# Patient Record
Sex: Male | Born: 1945 | Race: Black or African American | Hispanic: No | Smoking: Former smoker
Health system: Southern US, Community
[De-identification: ages and names within clinical notes are randomized; demographics above are authoritative.]

## PROBLEM LIST (undated history)

## (undated) DIAGNOSIS — E538 Deficiency of other specified B group vitamins: Secondary | ICD-10-CM

## (undated) DIAGNOSIS — E119 Type 2 diabetes mellitus without complications: Secondary | ICD-10-CM

## (undated) DIAGNOSIS — I219 Acute myocardial infarction, unspecified: Secondary | ICD-10-CM

## (undated) DIAGNOSIS — C3490 Malignant neoplasm of unspecified part of unspecified bronchus or lung: Secondary | ICD-10-CM

## (undated) DIAGNOSIS — I509 Heart failure, unspecified: Secondary | ICD-10-CM

## (undated) DIAGNOSIS — J449 Chronic obstructive pulmonary disease, unspecified: Secondary | ICD-10-CM

## (undated) DIAGNOSIS — I272 Pulmonary hypertension, unspecified: Secondary | ICD-10-CM

## (undated) DIAGNOSIS — E785 Hyperlipidemia, unspecified: Secondary | ICD-10-CM

## (undated) DIAGNOSIS — I10 Essential (primary) hypertension: Secondary | ICD-10-CM

## (undated) HISTORY — PX: APPENDECTOMY: SHX54

## (undated) HISTORY — DX: Pulmonary hypertension, unspecified: I27.20

## (undated) HISTORY — DX: Malignant neoplasm of unspecified part of unspecified bronchus or lung: C34.90

## (undated) HISTORY — PX: LUNG BIOPSY: SHX232

---

## 2003-05-11 ENCOUNTER — Emergency Department (HOSPITAL_COMMUNITY): Admission: EM | Admit: 2003-05-11 | Discharge: 2003-05-11 | Payer: Self-pay | Admitting: Emergency Medicine

## 2003-12-17 ENCOUNTER — Inpatient Hospital Stay (HOSPITAL_COMMUNITY): Admission: EM | Admit: 2003-12-17 | Discharge: 2003-12-22 | Payer: Self-pay | Admitting: Emergency Medicine

## 2004-01-30 ENCOUNTER — Emergency Department (HOSPITAL_COMMUNITY): Admission: EM | Admit: 2004-01-30 | Discharge: 2004-01-30 | Payer: Self-pay | Admitting: Emergency Medicine

## 2005-03-16 ENCOUNTER — Emergency Department (HOSPITAL_COMMUNITY): Admission: EM | Admit: 2005-03-16 | Discharge: 2005-03-16 | Payer: Self-pay | Admitting: Family Medicine

## 2005-11-12 ENCOUNTER — Inpatient Hospital Stay (HOSPITAL_COMMUNITY): Admission: EM | Admit: 2005-11-12 | Discharge: 2005-11-16 | Payer: Self-pay | Admitting: Emergency Medicine

## 2005-11-13 ENCOUNTER — Ambulatory Visit: Payer: Self-pay | Admitting: Cardiology

## 2005-11-13 ENCOUNTER — Encounter: Payer: Self-pay | Admitting: Cardiology

## 2007-05-11 ENCOUNTER — Ambulatory Visit: Payer: Self-pay | Admitting: *Deleted

## 2007-05-11 ENCOUNTER — Inpatient Hospital Stay (HOSPITAL_COMMUNITY): Admission: EM | Admit: 2007-05-11 | Discharge: 2007-05-13 | Payer: Self-pay | Admitting: *Deleted

## 2007-12-11 ENCOUNTER — Ambulatory Visit: Payer: Self-pay | Admitting: Internal Medicine

## 2007-12-11 LAB — CONVERTED CEMR LAB
ALT: 12 units/L (ref 0–53)
AST: 15 units/L (ref 0–37)
Albumin: 4.5 g/dL (ref 3.5–5.2)
Alkaline Phosphatase: 99 units/L (ref 39–117)
BUN: 14 mg/dL (ref 6–23)
CO2: 25 meq/L (ref 19–32)
Calcium: 8.5 mg/dL (ref 8.4–10.5)
Chloride: 106 meq/L (ref 96–112)
Cholesterol: 200 mg/dL (ref 0–200)
Creatinine, Ser: 1.08 mg/dL (ref 0.40–1.50)
Glucose, Bld: 110 mg/dL — ABNORMAL HIGH (ref 70–99)
HDL: 34 mg/dL — ABNORMAL LOW (ref >39)
LDL Cholesterol: 130 mg/dL — ABNORMAL HIGH (ref 0–99)
Potassium: 4.1 meq/L (ref 3.5–5.3)
Sodium: 142 meq/L (ref 135–145)
Total Bilirubin: 0.4 mg/dL (ref 0.3–1.2)
Total CHOL/HDL Ratio: 5.9
Total Protein: 7.6 g/dL (ref 6.0–8.3)
Triglycerides: 182 mg/dL — ABNORMAL HIGH (ref <150)
VLDL: 36 mg/dL (ref 0–40)

## 2008-01-08 ENCOUNTER — Ambulatory Visit: Payer: Self-pay | Admitting: Internal Medicine

## 2008-01-08 LAB — CONVERTED CEMR LAB
Basophils Absolute: 0 10*3/uL (ref 0.0–0.1)
Basophils Relative: 0 % (ref 0–1)
Eosinophils Absolute: 0.1 10*3/uL (ref 0.0–0.7)
Eosinophils Relative: 1 % (ref 0–5)
Free T4: 1.05 ng/dL (ref 0.89–1.80)
HCT: 44.7 % (ref 39.0–52.0)
Hemoglobin: 14.5 g/dL (ref 13.0–17.0)
Lymphocytes Relative: 24 % (ref 12–46)
Lymphs Abs: 2.6 10*3/uL (ref 0.7–4.0)
MCHC: 32.4 g/dL (ref 30.0–36.0)
MCV: 96.5 fL (ref 78.0–100.0)
Monocytes Absolute: 0.8 10*3/uL (ref 0.1–1.0)
Monocytes Relative: 8 % (ref 3–12)
Neutro Abs: 7.1 10*3/uL (ref 1.7–7.7)
Neutrophils Relative %: 67 % (ref 43–77)
Platelets: 204 10*3/uL (ref 150–400)
RBC: 4.63 M/uL (ref 4.22–5.81)
RDW: 13.2 % (ref 11.5–15.5)
T3, Total: 136.4 ng/dL (ref 80.0–204.0)
TSH: 1.208 microintl units/mL (ref 0.350–5.50)
WBC: 10.6 10*3/uL — ABNORMAL HIGH (ref 4.0–10.5)

## 2008-01-22 ENCOUNTER — Ambulatory Visit: Payer: Self-pay | Admitting: Internal Medicine

## 2008-01-26 ENCOUNTER — Ambulatory Visit (HOSPITAL_COMMUNITY): Admission: RE | Admit: 2008-01-26 | Discharge: 2008-01-26 | Payer: Self-pay | Admitting: Family Medicine

## 2008-02-15 ENCOUNTER — Ambulatory Visit: Payer: Self-pay | Admitting: Internal Medicine

## 2008-03-18 ENCOUNTER — Ambulatory Visit: Payer: Self-pay | Admitting: Internal Medicine

## 2008-03-24 ENCOUNTER — Emergency Department (HOSPITAL_COMMUNITY): Admission: EM | Admit: 2008-03-24 | Discharge: 2008-03-24 | Payer: Self-pay | Admitting: Emergency Medicine

## 2008-06-17 ENCOUNTER — Encounter: Payer: Self-pay | Admitting: Family Medicine

## 2008-06-17 ENCOUNTER — Ambulatory Visit: Payer: Self-pay | Admitting: Internal Medicine

## 2008-06-17 LAB — CONVERTED CEMR LAB
ALT: 61 units/L — ABNORMAL HIGH (ref 0–53)
AST: 49 units/L — ABNORMAL HIGH (ref 0–37)
Albumin: 4 g/dL (ref 3.5–5.2)
Alkaline Phosphatase: 81 units/L (ref 39–117)
BUN: 10 mg/dL (ref 6–23)
Basophils Absolute: 0 10*3/uL (ref 0.0–0.1)
Basophils Relative: 0 % (ref 0–1)
CO2: 26 meq/L (ref 19–32)
Calcium: 8.7 mg/dL (ref 8.4–10.5)
Chloride: 103 meq/L (ref 96–112)
Cholesterol: 144 mg/dL (ref 0–200)
Creatinine, Ser: 1.05 mg/dL (ref 0.40–1.50)
Eosinophils Absolute: 0.1 10*3/uL (ref 0.0–0.7)
Eosinophils Relative: 1 % (ref 0–5)
Glucose, Bld: 104 mg/dL — ABNORMAL HIGH (ref 70–99)
HCT: 42.7 % (ref 39.0–52.0)
HDL: 62 mg/dL (ref >39)
Hemoglobin: 13.3 g/dL (ref 13.0–17.0)
Hgb A1c MFr Bld: 5.9 % (ref 4.6–6.1)
LDL Cholesterol: 62 mg/dL (ref 0–99)
Lymphocytes Relative: 24 % (ref 12–46)
Lymphs Abs: 2.1 10*3/uL (ref 0.7–4.0)
MCHC: 31.1 g/dL (ref 30.0–36.0)
MCV: 104.4 fL — ABNORMAL HIGH (ref 78.0–100.0)
Monocytes Absolute: 0.7 10*3/uL (ref 0.1–1.0)
Monocytes Relative: 8 % (ref 3–12)
Neutro Abs: 5.8 10*3/uL (ref 1.7–7.7)
Neutrophils Relative %: 67 % (ref 43–77)
Platelets: 160 10*3/uL (ref 150–400)
Potassium: 3.3 meq/L — ABNORMAL LOW (ref 3.5–5.3)
Pro B Natriuretic peptide (BNP): 49 pg/mL (ref 0.0–100.0)
RBC: 4.09 M/uL — ABNORMAL LOW (ref 4.22–5.81)
RDW: 14.2 % (ref 11.5–15.5)
Sodium: 144 meq/L (ref 135–145)
TSH: 2.362 microintl units/mL (ref 0.350–5.50)
Total Bilirubin: 0.5 mg/dL (ref 0.3–1.2)
Total CHOL/HDL Ratio: 2.3
Total Protein: 6.9 g/dL (ref 6.0–8.3)
Triglycerides: 101 mg/dL (ref <150)
Uric Acid, Serum: 11 mg/dL — ABNORMAL HIGH (ref 4.0–7.8)
VLDL: 20 mg/dL (ref 0–40)
WBC: 8.7 10*3/uL (ref 4.0–10.5)

## 2008-09-05 ENCOUNTER — Inpatient Hospital Stay (HOSPITAL_COMMUNITY): Admission: EM | Admit: 2008-09-05 | Discharge: 2008-09-06 | Payer: Self-pay | Admitting: Emergency Medicine

## 2008-09-16 ENCOUNTER — Ambulatory Visit: Payer: Self-pay | Admitting: Family Medicine

## 2008-09-16 LAB — CONVERTED CEMR LAB
BUN: 24 mg/dL — ABNORMAL HIGH (ref 6–23)
CO2: 28 meq/L (ref 19–32)
Calcium: 10 mg/dL (ref 8.4–10.5)
Chloride: 98 meq/L (ref 96–112)
Creatinine, Ser: 1.42 mg/dL (ref 0.40–1.50)
Glucose, Bld: 144 mg/dL — ABNORMAL HIGH (ref 70–99)
PSA: 0.88 ng/mL (ref 0.10–4.00)
Potassium: 4.2 meq/L (ref 3.5–5.3)
Sodium: 139 meq/L (ref 135–145)
Uric Acid, Serum: 10.9 mg/dL — ABNORMAL HIGH (ref 4.0–7.8)

## 2008-10-06 ENCOUNTER — Emergency Department (HOSPITAL_COMMUNITY): Admission: EM | Admit: 2008-10-06 | Discharge: 2008-10-06 | Payer: Self-pay | Admitting: Emergency Medicine

## 2008-10-13 ENCOUNTER — Ambulatory Visit: Payer: Self-pay | Admitting: Family Medicine

## 2008-10-24 ENCOUNTER — Ambulatory Visit: Payer: Self-pay | Admitting: Internal Medicine

## 2008-10-24 ENCOUNTER — Encounter: Payer: Self-pay | Admitting: Family Medicine

## 2008-10-24 ENCOUNTER — Ambulatory Visit: Payer: Self-pay | Admitting: *Deleted

## 2008-10-24 LAB — CONVERTED CEMR LAB
BUN: 11 mg/dL (ref 6–23)
Basophils Absolute: 0.1 10*3/uL (ref 0.0–0.1)
Basophils Relative: 1 % (ref 0–1)
CO2: 27 meq/L (ref 19–32)
Calcium: 8.8 mg/dL (ref 8.4–10.5)
Chloride: 101 meq/L (ref 96–112)
Creatinine, Ser: 1.22 mg/dL (ref 0.40–1.50)
Eosinophils Absolute: 0.2 10*3/uL (ref 0.0–0.7)
Eosinophils Relative: 3 % (ref 0–5)
Glucose, Bld: 108 mg/dL — ABNORMAL HIGH (ref 70–99)
HCT: 43.1 % (ref 39.0–52.0)
Hemoglobin: 12.8 g/dL — ABNORMAL LOW (ref 13.0–17.0)
Lymphocytes Relative: 33 % (ref 12–46)
Lymphs Abs: 2.4 10*3/uL (ref 0.7–4.0)
MCHC: 29.7 g/dL — ABNORMAL LOW (ref 30.0–36.0)
MCV: 101.9 fL — ABNORMAL HIGH (ref 78.0–100.0)
Monocytes Absolute: 0.6 10*3/uL (ref 0.1–1.0)
Monocytes Relative: 9 % (ref 3–12)
Neutro Abs: 3.8 10*3/uL (ref 1.7–7.7)
Neutrophils Relative %: 54 % (ref 43–77)
Platelets: 362 10*3/uL (ref 150–400)
Potassium: 4.6 meq/L (ref 3.5–5.3)
Pro B Natriuretic peptide (BNP): 25.2 pg/mL (ref 0.0–100.0)
RBC: 4.23 M/uL (ref 4.22–5.81)
RDW: 15.7 % — ABNORMAL HIGH (ref 11.5–15.5)
Sodium: 139 meq/L (ref 135–145)
Uric Acid, Serum: 8.4 mg/dL — ABNORMAL HIGH (ref 4.0–7.8)
WBC: 7.1 10*3/uL (ref 4.0–10.5)

## 2008-11-07 ENCOUNTER — Ambulatory Visit: Payer: Self-pay | Admitting: Family Medicine

## 2008-12-19 ENCOUNTER — Ambulatory Visit: Payer: Self-pay | Admitting: Family Medicine

## 2009-01-03 ENCOUNTER — Ambulatory Visit (HOSPITAL_COMMUNITY): Admission: RE | Admit: 2009-01-03 | Discharge: 2009-01-03 | Payer: Self-pay | Admitting: Family Medicine

## 2009-03-20 ENCOUNTER — Ambulatory Visit: Payer: Self-pay | Admitting: Internal Medicine

## 2009-03-20 ENCOUNTER — Encounter: Payer: Self-pay | Admitting: Family Medicine

## 2009-03-20 LAB — CONVERTED CEMR LAB
ALT: 28 units/L (ref 0–53)
AST: 20 units/L (ref 0–37)
Albumin: 3.9 g/dL (ref 3.5–5.2)
Alkaline Phosphatase: 108 units/L (ref 39–117)
BUN: 15 mg/dL (ref 6–23)
Basophils Absolute: 0.1 10*3/uL (ref 0.0–0.1)
Basophils Relative: 1 % (ref 0–1)
CO2: 23 meq/L (ref 19–32)
Calcium: 9 mg/dL (ref 8.4–10.5)
Chloride: 108 meq/L (ref 96–112)
Cholesterol: 178 mg/dL (ref 0–200)
Creatinine, Ser: 1.07 mg/dL (ref 0.40–1.50)
Eosinophils Absolute: 0.1 10*3/uL (ref 0.0–0.7)
Eosinophils Relative: 1 % (ref 0–5)
Glucose, Bld: 114 mg/dL — ABNORMAL HIGH (ref 70–99)
HCT: 40.2 % (ref 39.0–52.0)
HDL: 54 mg/dL (ref >39)
Hemoglobin: 12.6 g/dL — ABNORMAL LOW (ref 13.0–17.0)
LDL Cholesterol: 97 mg/dL (ref 0–99)
Lymphocytes Relative: 23 % (ref 12–46)
Lymphs Abs: 2.4 10*3/uL (ref 0.7–4.0)
MCHC: 31.3 g/dL (ref 30.0–36.0)
MCV: 108.9 fL — ABNORMAL HIGH (ref 78.0–100.0)
Monocytes Absolute: 1.4 10*3/uL — ABNORMAL HIGH (ref 0.1–1.0)
Monocytes Relative: 13 % — ABNORMAL HIGH (ref 3–12)
Neutro Abs: 6.5 10*3/uL (ref 1.7–7.7)
Neutrophils Relative %: 63 % (ref 43–77)
Platelets: 304 10*3/uL (ref 150–400)
Potassium: 4.3 meq/L (ref 3.5–5.3)
RBC: 3.69 M/uL — ABNORMAL LOW (ref 4.22–5.81)
RDW: 16 % — ABNORMAL HIGH (ref 11.5–15.5)
Sodium: 143 meq/L (ref 135–145)
Total Bilirubin: 0.8 mg/dL (ref 0.3–1.2)
Total CHOL/HDL Ratio: 3.3
Total Protein: 7 g/dL (ref 6.0–8.3)
Triglycerides: 137 mg/dL (ref <150)
Uric Acid, Serum: 8.5 mg/dL — ABNORMAL HIGH (ref 4.0–7.8)
VLDL: 27 mg/dL (ref 0–40)
WBC: 10.4 10*3/uL (ref 4.0–10.5)

## 2009-03-29 ENCOUNTER — Ambulatory Visit: Payer: Self-pay | Admitting: Internal Medicine

## 2009-07-17 ENCOUNTER — Ambulatory Visit: Payer: Self-pay | Admitting: Internal Medicine

## 2009-10-18 ENCOUNTER — Ambulatory Visit: Payer: Self-pay | Admitting: Internal Medicine

## 2010-01-18 ENCOUNTER — Ambulatory Visit: Payer: Self-pay | Admitting: Internal Medicine

## 2010-01-18 LAB — CONVERTED CEMR LAB
ALT: 13 units/L (ref 0–53)
AST: 16 units/L (ref 0–37)
Albumin: 4.4 g/dL (ref 3.5–5.2)
Alkaline Phosphatase: 89 units/L (ref 39–117)
BUN: 12 mg/dL (ref 6–23)
CO2: 26 meq/L (ref 19–32)
Calcium: 9.1 mg/dL (ref 8.4–10.5)
Chloride: 103 meq/L (ref 96–112)
Cholesterol: 186 mg/dL (ref 0–200)
Creatinine, Ser: 1.1 mg/dL (ref 0.40–1.50)
Glucose, Bld: 108 mg/dL — ABNORMAL HIGH (ref 70–99)
HDL: 39 mg/dL — ABNORMAL LOW (ref >39)
LDL Cholesterol: 119 mg/dL — ABNORMAL HIGH (ref 0–99)
Potassium: 4.3 meq/L (ref 3.5–5.3)
Sodium: 140 meq/L (ref 135–145)
Total Bilirubin: 0.5 mg/dL (ref 0.3–1.2)
Total CHOL/HDL Ratio: 4.8
Total Protein: 7.3 g/dL (ref 6.0–8.3)
Triglycerides: 142 mg/dL (ref <150)
VLDL: 28 mg/dL (ref 0–40)

## 2010-02-01 ENCOUNTER — Ambulatory Visit: Payer: Self-pay | Admitting: Internal Medicine

## 2010-02-01 LAB — CONVERTED CEMR LAB
Hgb A2 Quant: 2.8 % (ref 2.2–3.2)
Hgb A: 97.2 % (ref 96.8–97.8)
Hgb F Quant: 0 % (ref 0.0–2.0)
Hgb S Quant: 0 % (ref 0.0–0.0)

## 2010-04-05 ENCOUNTER — Ambulatory Visit: Payer: Self-pay | Admitting: Family Medicine

## 2010-04-09 ENCOUNTER — Ambulatory Visit (HOSPITAL_COMMUNITY): Admission: RE | Admit: 2010-04-09 | Discharge: 2010-04-09 | Payer: Self-pay | Admitting: Internal Medicine

## 2010-04-24 ENCOUNTER — Ambulatory Visit: Payer: Self-pay | Admitting: Internal Medicine

## 2010-04-24 LAB — CONVERTED CEMR LAB
BUN: 24 mg/dL — ABNORMAL HIGH (ref 6–23)
CO2: 21 meq/L (ref 19–32)
Calcium: 9.7 mg/dL (ref 8.4–10.5)
Chloride: 102 meq/L (ref 96–112)
Creatinine, Ser: 1.12 mg/dL (ref 0.40–1.50)
Glucose, Bld: 97 mg/dL (ref 70–99)
Potassium: 4.1 meq/L (ref 3.5–5.3)
Sodium: 141 meq/L (ref 135–145)

## 2010-04-25 ENCOUNTER — Ambulatory Visit (HOSPITAL_COMMUNITY): Admission: RE | Admit: 2010-04-25 | Discharge: 2010-04-25 | Payer: Self-pay | Admitting: Internal Medicine

## 2010-05-09 ENCOUNTER — Ambulatory Visit: Payer: Self-pay | Admitting: Internal Medicine

## 2010-05-10 ENCOUNTER — Ambulatory Visit: Payer: Self-pay | Admitting: Psychiatry

## 2010-05-10 ENCOUNTER — Inpatient Hospital Stay (HOSPITAL_COMMUNITY): Admission: AD | Admit: 2010-05-10 | Discharge: 2010-05-18 | Payer: Self-pay | Admitting: Psychiatry

## 2010-06-07 ENCOUNTER — Ambulatory Visit: Payer: Self-pay | Admitting: Internal Medicine

## 2010-06-07 LAB — CONVERTED CEMR LAB: Microalb, Ur: 0.68 mg/dL (ref 0.00–1.89)

## 2010-08-06 ENCOUNTER — Ambulatory Visit: Payer: Self-pay | Admitting: Internal Medicine

## 2010-08-06 LAB — CONVERTED CEMR LAB
BUN: 15 mg/dL (ref 6–23)
CO2: 28 meq/L (ref 19–32)
Calcium: 9.5 mg/dL (ref 8.4–10.5)
Chloride: 104 meq/L (ref 96–112)
Creatinine, Ser: 1.16 mg/dL (ref 0.40–1.50)
Glucose, Bld: 110 mg/dL — ABNORMAL HIGH (ref 70–99)
Potassium: 4.1 meq/L (ref 3.5–5.3)
Sodium: 145 meq/L (ref 135–145)

## 2010-08-10 ENCOUNTER — Ambulatory Visit (HOSPITAL_COMMUNITY): Admission: RE | Admit: 2010-08-10 | Discharge: 2010-08-10 | Payer: Self-pay | Admitting: Internal Medicine

## 2010-08-13 ENCOUNTER — Ambulatory Visit: Payer: Self-pay | Admitting: Internal Medicine

## 2011-01-20 ENCOUNTER — Encounter: Payer: Self-pay | Admitting: Internal Medicine

## 2011-03-15 LAB — BASIC METABOLIC PANEL
BUN: 6 mg/dL (ref 6–23)
CO2: 28 mEq/L (ref 19–32)
Calcium: 8.9 mg/dL (ref 8.4–10.5)
Chloride: 106 mEq/L (ref 96–112)
Creatinine, Ser: 1.13 mg/dL (ref 0.4–1.5)
GFR calc Af Amer: >60 mL/min (ref >60)
GFR calc non Af Amer: >60 mL/min (ref >60)
Glucose, Bld: 117 mg/dL — ABNORMAL HIGH (ref 70–99)
Potassium: 3.6 mEq/L (ref 3.5–5.1)
Sodium: 141 mEq/L (ref 135–145)

## 2011-03-18 LAB — PLATELET COUNT: Platelets: 107 10*3/uL — ABNORMAL LOW (ref 150–400)

## 2011-03-19 LAB — BASIC METABOLIC PANEL
CO2: 26 mEq/L (ref 19–32)
Calcium: 8.3 mg/dL — ABNORMAL LOW (ref 8.4–10.5)
GFR calc Af Amer: >60 mL/min (ref >60)
GFR calc non Af Amer: 54 mL/min — ABNORMAL LOW (ref >60)
Potassium: 4.1 mEq/L (ref 3.5–5.1)
Sodium: 139 mEq/L (ref 135–145)

## 2011-03-19 LAB — HEPATIC FUNCTION PANEL
Alkaline Phosphatase: 129 U/L — ABNORMAL HIGH (ref 39–117)
Bilirubin, Direct: 1.7 mg/dL — ABNORMAL HIGH (ref 0.0–0.3)
Indirect Bilirubin: 1.9 mg/dL — ABNORMAL HIGH (ref 0.3–0.9)
Total Bilirubin: 3.6 mg/dL — ABNORMAL HIGH (ref 0.3–1.2)

## 2011-03-19 LAB — RAPID URINE DRUG SCREEN, HOSP PERFORMED
Barbiturates: NOT DETECTED
Benzodiazepines: NOT DETECTED
Cocaine: NOT DETECTED
Opiates: NOT DETECTED

## 2011-03-19 LAB — DIFFERENTIAL
Basophils Relative: 0 % (ref 0–1)
Eosinophils Relative: 1 % (ref 0–5)
Lymphocytes Relative: 18 % (ref 12–46)
Neutrophils Relative %: 74 % (ref 43–77)

## 2011-03-19 LAB — HEPATITIS PANEL, ACUTE
HCV Ab: NEGATIVE
Hep A IgM: NEGATIVE

## 2011-03-19 LAB — URINALYSIS, ROUTINE W REFLEX MICROSCOPIC
Glucose, UA: NEGATIVE mg/dL
Ketones, ur: NEGATIVE mg/dL
Protein, ur: NEGATIVE mg/dL
Urobilinogen, UA: 1 mg/dL (ref 0.0–1.0)

## 2011-03-19 LAB — URINE MICROSCOPIC-ADD ON

## 2011-03-19 LAB — COMPREHENSIVE METABOLIC PANEL
Albumin: 3.1 g/dL — ABNORMAL LOW (ref 3.5–5.2)
Alkaline Phosphatase: 168 U/L — ABNORMAL HIGH (ref 39–117)
BUN: 14 mg/dL (ref 6–23)
Chloride: 99 mEq/L (ref 96–112)
Glucose, Bld: 143 mg/dL — ABNORMAL HIGH (ref 70–99)
Potassium: 3.7 mEq/L (ref 3.5–5.1)
Total Bilirubin: 0.9 mg/dL (ref 0.3–1.2)

## 2011-03-19 LAB — URINE CULTURE

## 2011-03-19 LAB — CBC
HCT: 53.5 % — ABNORMAL HIGH (ref 39.0–52.0)
Hemoglobin: 17.8 g/dL — ABNORMAL HIGH (ref 13.0–17.0)
RBC: 5.29 MIL/uL (ref 4.22–5.81)

## 2011-03-19 LAB — PLATELET COUNT: Platelets: 81 10*3/uL — ABNORMAL LOW (ref 150–400)

## 2011-03-19 LAB — RPR: RPR Ser Ql: NONREACTIVE

## 2011-04-15 LAB — BLOOD GAS, ARTERIAL
FIO2: 0.21 %
O2 Saturation: 93.1 %
Patient temperature: 98.6
pH, Arterial: 7.407 (ref 7.350–7.450)

## 2011-05-14 NOTE — H&P (Signed)
NAME:  John [name], John [name] NO.:  [id number]   MEDICAL RECORD NO.:  [id number]          PATIENT TYPE:  INP   LOCATION:  5503                         FACILITY:  [facility]   PHYSICIAN:  Peggye Pitt, M.D. DATE OF BIRTH:  [birth year]   DATE OF ADMISSION:  09/05/2008  DATE OF DISCHARGE:                              HISTORY & PHYSICAL   PRIMARY CARE PHYSICIAN:  [facility].   CARDIOLOGIST:  Nicki Guadalajara, MD   CHIEF COMPLAINT:  Left facial swelling.   HISTORY OF PRESENT ILLNESS:  Mr. Angell is a 65 year old African American  man who presents today with left facial edema x3 days and was acutely  worse this morning with lip and tongue swelling, which made him decide  to come into the emergency department this morning.  It was very  difficult for me to obtain any history from him, not because of speech  difficulty, but because of issues with his processing of information.  It looks like he was admitted in May 2008 with angioedema secondary to  an ACE inhibitor.  He denies any nausea, vomiting, or diarrhea, and  feels fine other than the left facial edema.  I am unable to confirm his  med list at this time (I am trying to get in touch with [facility]  EMS who brought him into the ED, but I do not have an actual report from  them yet), but per the medication reconciliation form filled out in the  ED, it looks like he is on an ACE inhibitor despite the fact that he had  angioedema last year.  Of note upon admission in the ED, his creatinine  was 4.7 and last known value was 0.91 in May 2008.  He was also found to  be hypotensive and mildly orthostatic.   ALLERGIES:  ACE INHIBITORS, which give him angioedema.   PAST MEDICAL HISTORY:  1. Angioedema secondary to ACE inhibitors.  2. CHF with an ejection fraction of 35-45%.  3. Hypertension.  4. Coronary artery disease.  5. Tobacco abuse.  6. Alcohol abuse.   HOME MEDICATIONS:  Again, I am unable to confirm at this time.   I am  waiting a call back from [facility] who brought the patient to  the emergency department, but what I have on his medication  reconciliation from that was filled out in the emergency department.  It  looks like he is on,  1. Amlodipine.  2. Benazepril.  3. Torsemide.  4. Pravastatin.  5. Isosorbide dinitrate.  6. Omeprazole.  7. Albuterol; all of these at unknown doses.   SOCIAL HISTORY:  Lives by himself.  He smokes 2-3 cigarettes a day for  many years.  He drinks about a quart of beer every day per his report.  He denies any illicit drug use.   FAMILY HISTORY:  Noncontributory.   REVIEW OF SYSTEMS:  Negative except as per HPI.   PHYSICAL EXAMINATION:  VITAL SIGNS:  Upon admission, blood pressure  75/43 that rose to 127/76 simply with IV fluids, heart rate was 93,  respirations 28, and O2 sats were  93% on room air.  GENERAL:  He is alert, awake, and oriented x3 not in acute distress.  HEENT:  Normocephalic and atraumatic.  His pupils are equally reactive  to light and accommodation with intact extraocular movements.  NECK:  Edematous especially over the left side of his neck and face,  with swollen lips, swollen tongue, and an enlarged uvula.  LUNGS:  He has some crackles at the bases, right greater than left.  HEART:  Regular rate and rhythm with no murmurs, rubs, or gallops  auscultated.  ABDOMEN:  Soft, nontender, obese, and nondistended.  Positive pulses.  EXTREMITIES:  About 1+ pedal edema bilaterally up to mid shin.   LABORATORY DATA UPON ADMISSION:  Sodium 137, potassium 3.9, chloride  110, bicarb 19, BUN 48, and creatinine of 4.7, of note was 0.91 in May  2008 and a glucose of 110.  His white count is 14.1 with an ANC of 11.1,  hemoglobin 12.8, and platelets of 220.  All LFTs are within normal  limits except for an albumin of 3.2.  Urinalysis was negative.  ESR was  elevated at 59.  He had a chest x-ray that showed chronic lung markings  at the right  base that seemed more prominent.  Differential includes  scarring versus mild patchy pneumonia.  I also obtained a CT neck given  the swelling of his face and neck, which showed a widely patent airway  with inflammatory changes and edema over his left base involving the  underlying muscle with no abscess identified.  He also had soft tissue  prominence on the left side of his hypopharynx.   ASSESSMENT AND PLAN:  1. Angioedema, which is likely secondary to the reinstatement of an      ACE inhibitor.  Unfortunately at this time, I do not have any      information on whether this medication was recently restarted.  His      airway however is widely patent, so we do not need to worry about      intubating this gentleman at this time.  Plan for this will be to      give him some IV steroids, Benadryl.  Given he was hypotensive when      he initially came into the emergency department, I will also give      him a dose of epinephrine.  IT IS OF NOTE THAT THIS GENTLEMAN      CANNOT BE PUT ON ANY ACE INHIBITORS.  2. For his acute renal failure, with a creatinine of 4.7 that was      normal in May 2008.  Before pursuing an aggressive workup, I will      give him some IV fluids and recheck his BUN and creatinine in the      morning especially given he was hypotensive upon arrival.  This      might have caused some degree of acute renal failure secondary to      volume depletion.  If his creatinine is not improving in the      morning, we will need to consider further workup like a urinalysis      for obstructive uropathy and if that is negative, consider other      causes like acute tubular necrosis.  3. For his pneumonia as evidenced on chest x-ray and with white count,      we will start on community-acquired coverage with Rocephin and      azithromycin daily.  4. For his alcohol abuse, we will place him on thiamine and folate.  5. For his hypertension, given he was hypotensive we will hold all  of      his medications at this time.  6. For prophylaxis, we will place on subcutaneous heparin for DVT      prophylaxis and Protonix for GI prophylaxis.      Peggye Pitt, M.D.  Electronically Signed     EH/MEDQ  D:  09/05/2008  T:  09/06/2008  Job:  [id number]

## 2011-05-14 NOTE — Discharge Summary (Signed)
NAME:  John [name], John [name] NO.:  [id number]   MEDICAL RECORD NO.:  [id number]          PATIENT TYPE:  INP   LOCATION:  6733                         FACILITY:  [facility]   PHYSICIAN:  John Charity, MD     DATE OF BIRTH:  March 08, 1946   DATE OF ADMISSION:  05/11/2007  DATE OF DISCHARGE:  05/13/2007                               DISCHARGE SUMMARY   DISCHARGE DIAGNOSES:  1. Angioedema secondary to ACE inhibitors.  2. 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  3. Hypertension.  4. Coronary artery disease.  5. Tobacco abuse.  6. Macrocytosis, most likely secondary to alcohol use.   DISCHARGE MEDICATIONS:  1. Prednisone taper.  2. BiDil 1 pill p.o. daily.  3. Combivent inhaler p.r.n.   DISPOSITION:  1. Patient is to follow up with John [name], ENT specialist in 1 week's      time.  2. Patient is to also follow up with John [name] at [facility] in 2 weeks' time.  Of note, patient was recently started      on BiDil during hospitalization and ACE inhibitor must be avoided      for future heart failure treatment given history of angioedema      during this hospitalization.  Also may consider adding beta-blocker      to patient's blood pressure regimen.   CONSULTANTS THIS ADMISSION:  ENT, John [name].   PROCEDURES DONE THIS ADMISSION:  Laryngoscopy, done on May 12, 2007,  showing improvement of laryngeal and uvula edema.  No acute airway  issues.   BRIEF HISTORY AND PHYSICAL:  Please see medical records for full detail.  In brief, John [name] is a 65 year old African American man with a history  of nonischemic cardiomyopathy, EF of 35-45%, who was started on  lisinopril 6 days prior to admission.  On the night prior to admission,  patient suddenly started having uncontrollable bouts of productive  coughing associated with dyspnea and swelling of his throat.   PHYSICAL EXAMINATION ON ADMISSION:  VITAL SIGNS:  Temperature 99.6.  Blood pressure  193/112.  Pulse 88.  Respiratory rate 24.  O2 saturations  97% on room air.  GENERAL APPEARANCE:  Patient not in acute distress.  Speaking full  sentences.  HEENT:  Eyes:  Pupils equal, round and reactive to light.  Extraocular  movements intact.  ENT:  Oropharynx with slight erythematous punctate  lesions.  Uvula enlarged, almost blocking airway.  NECK:  Supple, but erythematous.  LUNGS:  Airway equal bilaterally with occasional wheezing present  bilaterally.  HEART:  Regular rate and rhythm.  No murmurs, rubs or gallops.  ABDOMEN:  Soft, obese, nontender.  Bowel sounds present.  EXTREMITIES:  No edema bilaterally.  Pulses 2+ bilaterally.  SKIN:  Warm and dry.  A 1 cm hyperpigmented lesion just below left  scapula stuck on appearance.  NEUROLOGICALLY:  Alert and oriented x3.  Cranial nerves II-XII intact.  Strength 5/5 bilaterally.  Tone normal bilaterally.  Sensation is intact  bilaterally.  Reflexes 2+ bilaterally.  Finger-nose, finger test normal.  PSYCH:  Appropriate.   LAB TESTS ON ADMISSION:  Sodium 140, potassium 3.7, chloride 104, bicarb  26, BUN 11, creatinine 0.91, glucose 97.  Total bili 0.5, alkaline  phosphatase 76, SGOT 20, SGPT 20.  Total protein 7.1, albumin 3.1,  calcium 9.1.  Hemoglobin 13.5, hematocrit 40.0, MCV 98.9, white cell  count 11.3, ANC 8.7, platelets 388.  Rapid streph screen negative.   BRIEF HOSPITAL COURSE:  1. Angioedema most likely secondary to ACE inhibitor.  Patient was      treated with 2 doses of 0.1 mg epinephrine, IV dexamethasone 8 mg      q.8 hours, and Benadryl.  Patient' vital signs and respiratory      status were stable throughout hospitalization.  The following day,      the patient had laryngoscopy by ENT, which showed laryngeal and      uvula edema significantly improved.  There was no airway compromise      at that time.  Lip and facial edema decreased by the day of      discharge.  Patient was transitioned to p.o. prednisone.  He  was      discharged with a prednisone taper, and he is to follow up with Dr.      Suszanne [name] of ENT in 1 week's time.  If patient has a recurrent episode      of angioedema off lisinopril then hereditary causes of angioedema      need to be considered.  2. Nonischemic cardiomyopathy with an EF of 35-45%.  Clinically,      patient had no evidence of exacerbation. Due to his presentation      with angioedema attributed to ACE inhibitor, the patient was      started on BiDil as a substitute.  This was discussed with the      John [name] in communication with John [name] and they are aware      that patient has been started on BiDil.  He is to follow up with      John [name] as previously scheduled.  3. Leukocytosis.  This was thought to be secondary to stress      demargination from problem number 1 as he did not have any other      signs or symptoms of infection.  White cell count trended down by      discharge despite the use of steroids.  4. Macrocytosis with normal hemoglobin.  Review of old records show      that patient always had an upper limit of normal MCV.  This was      most likely secondary to alcohol abuse.  We would recommend B12 and      RBC folate to be checked as an outpatient.   DISCHARGE VITALS:  Temperature 98.0.  Blood pressure 155/76.  Heart rate  55.  Respiratory rate of 20.  O2 saturation is 93% on room air.   DISCHARGE LABS:  Sodium 140, potassium 4.0, chloride 104, bicarb 28, BUN  13, creatinine 0.90, glucose 150.  Hemoglobin 12.9, hematocrit 38.7,  white cell count 11.7 (on steroids), platelets 391, MCV 99.0.      John [name], M.D.  Electronically Signed      John Charity, MD  Electronically Signed    SS/MEDQ  D:  05/13/2007  T:  05/13/2007  Job:  [id number]   cc:   John [name], M.D.  John Pies, MD

## 2011-05-14 NOTE — Discharge Summary (Signed)
NAME:  John [name], STOLP NO.:  [id number]   MEDICAL RECORD NO.:  [id number]          PATIENT TYPE:  INP   LOCATION:  5503                         FACILITY:  [facility]   PHYSICIAN:  Renee Ramus, MD       DATE OF BIRTH:  [birth year]   DATE OF ADMISSION:  09/05/2008  DATE OF DISCHARGE:  09/06/2008                               DISCHARGE SUMMARY   PRIMARY DIAGNOSIS:  Angioedema secondary to ACE inhibitor.   SECONDARY DIAGNOSES:  1. Congestive heart failure.  2. Hypertension.  3. Acute renal failure on chronic renal insufficiency.  4. Coronary artery disease.  5. Alcohol and tobacco abuse.   HOSPITAL COURSE:  1. Angioedema.  The patient is a 65 year old male who was admitted      secondary to progressive facial swelling consistent with      angioedema.  The patient had been previously diagnosed with      angioedema from ACE inhibitor.  The patient was found to have been      taking lisinopril but pleads ignorance to the fact that that could      cause him problems.  The patient was also found to have creatinine      at 4.7 with a baseline value of 0.91.  The patient was admitted to      our service.  He was given Benadryl.  He was given prednisone.  He      will be discharged with a prednisone taper.  He has been told never      to take lisinopril again.  He understands that this is the cause of      his symptoms.  The patient made complete recovery.  His creatinine      has now fallen to 1.4.  We anticipate that it will continue to      decrease.  2. Congestive heart failure.  The patient did have echocardiogram done      on November 13, 2005, showing systolic dysfunction with an EF of 25-      35%.  The patient obviously cannot tolerate ACE inhibitor or ARB;      however, we are sending him home on carvedilol as well as aspirin,      and he is already taking Lasix.  The patient is also on amlodipine      for blood pressure, and I will ask primary care physician to  make      changes as needed.  3. Hypertension as above.  4. Acute renal failure on chronic renal insufficiency.  More than      likely this was reaction to the lisinopril.  He looks like he is      recovering nicely and he will not require additional treatment.  5. Coronary artery disease.  The patient has no evidence of acute      coronary syndrome.  6. Tobacco and alcohol abuse.  The patient has been counseled with      respect to tobacco cessation.  He has had no signs of alcohol      withdrawal throughout the hospitalization.  LABORATORY DATA:  1. Of note, initial leukocytosis; white count of 14.1, mild anemia      with hemoglobin of 12.6, hematocrit 37, and platelet count of 220.  2. Initial creatinine of 4.7 decreasing to 1.41 with a BUN initially      of 48 decreasing to 32.  3. UA showing nonconcentrated urine with specific gravity of 1.012 and      no evidence of infection.  4. Blood cultures x2 showed no growth to date.   STUDIES:  1. CT of neck without contrast showing inflammatory change in edema      and subcutaneous fat overlying the left side of the face and the      underlying musculature, no abscess is defined.  The patient is      having noted inflammatory process confined within his face.  2. Chest x-ray showing prominent lung markings in the right base, more      than likely secondary to scarring.  The patient also had chronic      ankylosis of the spine.   MEDICATIONS ON DISCHARGE:  1. Amlodipine 10 mg p.o. daily.   1. Furosemide 20 mg p.o. daily.  2. Pravastatin 40 mg p.o. daily.  3. Isosorbide dinitrate 1 tablet p.o. t.i.d.  4. Albuterol inhaler 1-2 puffs very q.6 h. p.r.n. wheezes.  5. Prednisone 40 mg p.o. daily x3 then 20 mg p.o. daily x3 then 10 mg      p.o. daily x3, and then off.  6. Carvedilol 6.25 mg p.o. b.i.d.  7. Aspirin 81 mg p.o. daily.  8. Ciprofloxacin 500 mg p.o. b.i.d. x4 days.   There are no labs or studies pending at time of  discharge.  The patient  is in a stable condition and anxious for discharge.  We do not believe  the patient had any signs of acute heart failure on his chronic heart  failure and we are giving him antibiotics for prophylaxis against  infection x4 days.   TIMES SPENT:  Thirty five minutes.      Renee Ramus, MD  Electronically Signed     JF/MEDQ  D:  09/06/2008  T:  09/07/2008  Job:  [id number]   cc:   Dala Dock

## 2011-05-17 NOTE — Cardiovascular Report (Signed)
NAME:  John [name], John [name] NO.:  [id number]   MEDICAL RECORD NO.:  [id number]          PATIENT TYPE:  INP   LOCATION:  4705                         FACILITY:  [facility]   PHYSICIAN:  Nicki Guadalajara, M.D.     DATE OF BIRTH:  10-Jul-1946   DATE OF PROCEDURE:  11/15/2005  DATE OF DISCHARGE:                              CARDIAC CATHETERIZATION   This is a right left heart catheterization.   INDICATIONS:  [name] is a 65 year old African-American male who  has a 40-year history of tobacco use, history of EtOH use who was admitted  to [facility] with CHF/pulmonary edema. The pulmonary  echocardiogram suggested an ejection fraction of approximately 30%.  Initially, he was in sinus tachycardia with nonspecific T changes. He  ultimately has stabilized with initiation of medical therapy. In light of  his significant risk factors and high likelihood for ischemic etiology,  definitive diagnostic catheterization was recommended.   PROCEDURE:  After premedication with Valium 5 milligrams intravenously, the  patient was prepped and draped in the usual fashion. Because of his  presentation with CHF/pulmonary edema, right and left heart catheterization  was performed. Right femoral artery and right femoral vein were punctured  anteriorly and 5-French arterial sheath and 7-French venous sheath was  inserted. Swan-Ganz catheter was advanced through the venous sheath and  advanced to the RA, RV and PA under hemodynamic monitoring. Cardiac outputs  were obtained both by the assumed Fick method as well as the thermodilution  methods. Pigtail catheter was inserted and simultaneous PCLV pressures were  recorded. Biplane cine left ventriculography was performed. The right heart  pullback was then performed. The coronary angiography was done utilizing 5-  [place] Judkins #4 left and right coronary catheters. The patient tolerated  the procedure well and returned to his room in  satisfactory condition.   HEMODYNAMIC DATA:  Right atrial pressure mean 8, A-wave 11, V-wave 10. Right  ventricular pressure 42/8. Pulmonary pressure 42/19, mean 29. Mean pulmonary  capillary wedge pressure 13. Central aortic pressure 135/69. Left ventricle  pressure 135/13.   O2 saturation in the pulmonary artery was 56% and in the aorta was 93%.   Cardiac output by the thermodilution method was 5.8 and by the Fick method  was 3.5 liters per minute. Cardiac index was 3.0 and 1.8 liters per minute  per meter squared, respectively.   ANGIOGRAPHIC DATA:  Left main coronary arteries was a large-caliber vessel  which bifurcated into a tortuous LAD and circumflex system. Calcification  was noted in the LAD as well as in the circumflex vessel.   The LAD had a 60% narrowing at a bend in the first diagonal vessel. After a  proximal bend in the LAD beyond the diagonal takeoff, there was 40-50%  narrowing.   The circumflex vessel was tortuous and had a diffusely calcified mid-  segment. There was 40% narrowing before the OM-1 takeoff followed by 60-70%  narrowing in this calcified segment ostially beyond the OM-1 takeoff. The OM-  1 itself had 50% narrowing in its proximal third segment.   The right coronary  was diffusely calcified, particularly proximally. There  was diffuse narrowing of 70% tapering to 50% in the proximal to mid-segment  and then was 40% narrowing in the region just proximal to the crux.   Biplane cine left ventriculography revealed significant left ventricular  hypertrophy. There appeared to be global decline in LV function with  ejection fraction of 35-40%; however, ectopy was present making exact  measurement not obtainable.   Distal aortography did not reveal any renal artery stenosis. There was  eccentric calcification with narrowing ostially in the right common iliac  artery.   IMPRESSION:  1.  Moderate left ventricular dysfunction with left ventricular  hypertrophy      and ejection fraction of 35-40%.  2.  Significant coronary calcification involving the left anterior      descending artery, circumflex and right coronary arteries with a 60%      narrowing in the first diagonal branch of the left anterior descending      artery followed by 40-50% narrowing in the left anterior descending      artery; calcification in the proximal circumflex with 40% narrowing      before the obtuse marginal-1 takeoff extending to 60-70% narrowing after      the obtuse marginal-1 takeoff with 50% stenosis in this obtuse marginal-      1 vessel; and diffuse 70-50% narrowing in the proximal right coronary      artery followed by 40-50% narrowing in the mid-distal segment.  3.  A 50% narrowing in the right common iliac artery.   RECOMMENDATIONS:  [name] initially presented without known prior cardiac  disease. He does have LV dysfunction with left ventricular hypertrophy.  Initial medical trial will be recommended to optimize LV filling pressures  and treatment for known CAD. If the patient ultimately will require  intervention, he may need high-speed rotational atherectomy due to  significant calcification but this will only be done after failed medical  trial.           ______________________________  Nicki Guadalajara, M.D.     TK/MEDQ  D:  11/15/2005  T:  11/16/2005  Job:  [id number]   cc:   Jackie Plum, M.D.   Patient's chart   Cardiac catheterization lab

## 2011-05-17 NOTE — H&P (Signed)
NAME:  John [name], PIGEON NO.:  [id number]   MEDICAL RECORD NO.:  [id number]          PATIENT TYPE:  EMS   LOCATION:  MAJO                         FACILITY:  [facility]   PHYSICIAN:  Jackie Plum, M.D.DATE OF BIRTH:  09-12-1946   DATE OF ADMISSION:  11/12/2005  DATE OF DISCHARGE:                                HISTORY & PHYSICAL   CHIEF COMPLAINT:  Shortness of breath.   HISTORY OF PRESENT ILLNESS:  The patient is a 65 year old African-American  gentleman who presented with progressively worsening shortness of breath  which started last night. According to the patient, he was doing well until  some time last evening when he started to experience some shortness of  breath with cough productive of whitish sputum. His dyspnea was worsened by  exertion and it got pretty severe, culminating in activation of EMS. The  patient did not have any fever, chills, lightheadedness, leg or calf pain,  or ankle swelling. He has tachycardia and orthopnea without PND. There is no  history of vomiting but he feels a little bit nauseated. There is no  abdominal pain, constipation, diarrhea, chills, headache, or any fainting  spells. He denied any association with dysuria or frequency of micturition.  He has not had any skin rash. The patient had a similar episode in the past  in January 2005 at what time he was treated with Lasix and other medications  including Toprol-XL, but had not been compliant to his medicines since then.  He does not have a primary care physician and has not seen a physician in  more than a year now. At the emergency room on arrival, the patient was  noted to be in acute respiratory distress, sitting up on a stretcher,  receiving oxygen by nonrebreathing mask. He was said to be cool and very  diaphoretic without any complaints of chest pain. He was noted to have  crackles throughout on pulmonary auscultation. He was placed on BiPAP and  received IV morphine and  Lasix and the patient improved subsequently after  quite a good diuresis, and was placed on oxygen by nasal cannula. The  hospitalist service was called to evaluate for admission. On arrival, the  patient's blood pressure was 226/117, he was tachycardic at 137 per minute.  However, at the time of my evaluation, his blood pressure had come down  some.   PAST MEDICAL HISTORY:  The patient gives history of heart disease. He tells  me that he feels that he might have had a heart attack before but cannot  tell. He has history of hypertension contained but denies a history of  diabetes.   FAMILY HISTORY:  Negative for diabetes, hypertension, heart disease, or  constipation.   MEDICATIONS:  The patient is not any medicines. He does not have any  medication allergies.   SOCIAL HISTORY:  The patient smokes one pack to a half a pack of cigarettes  daily. He drinks alcohol on a daily basis and does not use any illicit  drugs.   REVIEW OF SYSTEMS:  Significant positives and negatives are as noted in the  HPI; otherwise, unremarkable.   PHYSICAL EXAMINATION:  VITAL SIGNS:  Blood pressure was 198/104, pulse 94,  respirations were 22, his O2 saturation was 95% on oxygen 2 L per minute by  nasal cannula.  GENERAL:  The patient was not in acute cardiopulmonary distress.  HEENT:  Normocephalic, atraumatic. Pupils were equal, round, reactive to  light. Extraocular movements were intact. Oropharynx was moist. The patient  was edentulous.  NECK:  Supple, no JVD.  LUNGS:  The patient had crackles at the lower third of his lung fields.  There was also some mild wheezing.  CARDIAC:  The patient was not tachycardic, rhythm was regular. There were no  gallops appreciated.  ABDOMEN:  Full, soft, bowel sounds present. No hepatosplenomegaly was  appreciated.  EXTREMITIES:  The patient did not have any cyanosis. Neither did he have any  edema.   LABORATORY DATA:  X-rays showed mild edema. A 12-lead EKG  shows nonspecific  ST-T wave changes without any acute changes. There was LVH. A pH was 7.293,  PCO2 67.6, PO2 578.0, bicarbonate 32.7, oxygen saturation of 100% - I  believe this might have been done whilst on BiPAP. Sodium 142, potassium  3.5, chloride 108, BUN 16, creatinine was 1.0, glucose 197. Wbc count 15.4,  hemoglobin 13.0, hematocrit 38.2, MCV 98.3, platelet count 204. Point of  care cardiac markers were negative. BNP was 266.6. Alcohol level was 123  mg/dcl. UA was negative for any UTI. The patient's urine drug screen was  positive for opiates and this is obtained at about 8 o'clock. This might  have been obtained after the patient received his morphine.   IMPRESSION:  1.  Acute flash pulmonary edema.  2.  Uncontrolled hypertension.  3.  Hyperglycemia.  4.  Leukocytosis.   For pulmonary edema, the patient is significantly improved. I am not sure  whether he has any incipient heart disease. The patient may have diastolic  dysfunction, looking at his LVH. The plan is to continue diuresis. Taper the  patient off nitroglycerin drip and switch him to Imdur p.o. He received a  cocktail of aspirin, beta blocker, and ACE inhibitor. Will give him low-dose  nicotine patch and get him seen by cigarette cessation team. The patient  will be diuresed aggressively for the next 24 hours and then slowed down  with monitoring of his electrolytes. Will get a fasting lipid panel and  serial cardiac enzymes including EKG and TSH, amongst others. The patient  will be seen by a team of heart failure nursing and patient education in  this regard. I will ask social worker and case management to see the patient  to educate him regarding options available to him with respect to finding  himself a primary care physician. The patient will be monitored carefully  for alcohol withdrawal. Further recommendations will be made as the patient exhibits response.      Jackie Plum, M.D.   Electronically Signed     GO/MEDQ  D:  11/12/2005  T:  11/12/2005  Job:  [id number]

## 2011-05-17 NOTE — Discharge Summary (Signed)
NAME:  John [name], John [name]               ACCOUNT NO.:  [id number]   MEDICAL RECORD NO.:  [id number]          PATIENT TYPE:  INP   LOCATION:  4705                         FACILITY:  [facility]   PHYSICIAN:  Kela Millin, M.D.DATE OF BIRTH:  08-Jan-1946   DATE OF ADMISSION:  11/12/2005  DATE OF DISCHARGE:  11/16/2005                                 DISCHARGE SUMMARY   DISCHARGE DIAGNOSES:  1.  Congestive heart failure-ejection fraction of 25-35% on echocardiogram      and 35-45% on cardiac catheterization.  2.  Coronary artery disease-status post cardiac catheterization per Dr.      Tresa Endo with significant coronary calcification involving the left      anterior descending artery, circumflex and right coronary arteries.      Please see cath report on the E-chart for full cardiac cath report for      further details.  3.  Uncontrolled hypertension.  4.  Alcohol abuse.  5.  Tobacco abuse.  6.  Hypokalemia.   CONSULTATIONS:  Cardiology, Dr. Tresa Endo with [facility].   PROCEDURES:  Cardiac catheterization done on November 15, 2005 revealed  moderate left ventricular dysfunction with left ventricular hypertrophy,  ejection fraction of 35-40%, also showed significant coronary calcification  involving the left anterior descending artery, the circumflex and right  coronary arteries. Please see path report for full details. The LAD was  followed by 40-50% narrowing.  1.  Echocardiogram done on November 13, 2005 showed overall left ventricular      systolic function moderate to markedly decreased with an ejection      fraction of 25-35%, severe diffuse left ventricular hypokinesis, left      ventricular wall thickness moderately increased. Left atrium was mildly      dilated; right atrium mildly dilated.   HISTORY:  The patient is a 65 year old black male who presented with  complaints of shortness of breath that began on the night prior to  presentation. According to the  patient, he had been doing well until that  night when he began experiencing shortness of breath and a cough productive  of whitish sputum. He has dyspnea was worsened by exertion and it got pretty  severe. So they ambulance was called. The patient denied any fevers, chills,  lightheadedness, leg or calf pain. Also denied ankle swelling. He admitted  to nausea but no vomiting, and denied abdominal pain, constipation,  diarrhea, chills, headaches or fainting spells. The patient also denied  dysuria. The patient reported that he had a similar episode in the past in  January 01, 2004 at which time he was treated with Lasix and Toprol but had  not been compliant with his medications since then. He stated he had not  seen a primary care physician in more than a year.   In the emergency room, the patient was noted to be in respiratory distress  on arrival and he received oxygen with a nonrebreathing mask. It was also  reported that the patient was cool and very diaphoretic without complaints  of chest pain at the time he came to the  ER. Also he was noted to have  crackles throughout his lung exam at the time he presented. The patient was  placed on BiPAP and received IV morphine and Lasix after which his symptoms  improved significantly following diuresis. The patient was also placed on  supplemental O2 and the [facility] hospitalist service was called to admit the  patient.   On arrival, the patient's blood pressure was noted to be 226/117 and he was  tachycardiac with a rate of 137.   His physical exam upon admission as per Dr. Julio Sicks revealed a blood  pressure of 198/104 with a pulse of 94, respiratory rate of 22. His O2  saturations were 95% on oxygen 2 liters per minute by nasal cannula. In  general, the patient was in no acute cardiopulmonary distress at the time he  was examined by Dr. Julio Sicks and the pertinent findings on exam. On HEENT,  he was noted to be edentulous. On his lung  exam, he had crackles at the  lower third of his lung fields. He also had some mild wheezing. On his  cardiovascular exam, he was tachycardiac with a regular rhythm and no  gallops appreciated. On his extremities, no cyanosis and no edema.   On the laboratory data, chest x-ray showed mild edema. A 12-lead EKG showed  nonspecific ST and T-wave changes and there was LVH noted. The pH was 7.29,  pCO2 of 67.6, pO2 of 587, bicarbonate of 32.7, O2 sat of 100%. His sodium  was 142 with a potassium of 3.5, chloride of 109, BUN 16, creatinine 1,  glucose 197. White cell count 15.4, hemoglobin 13, hematocrit 38.2, MCV  98.3, platelet count 204,000. Point of care enzymes negative. B natriuretic  peptide 266. Alcohol level 123. Urinalysis negative for infection. Urine  drug screen positive for opiates.   HOSPITAL COURSE:  Problem #1:  CONGESTIVE HEART FAILURE:  Upon admission,  the patient was continued on IV Lasix for diuresis and the nitroglycerin  drip was tapered off and the patient switched to oral nitrates-Imdur. The  patient was also started on aspirin, beta blockers and ACE inhibitor.  Cardiac enzymes were done and these were negative for MI. A 2-D  echocardiogram was also done and the results are as stated above. The  patient's initial B natriuretic peptide was 266.6 as already stated, and  following the above interventions, his recheck B-type natriuretic peptide  was 31.3. Cardiology was consulted and [facility] saw  the patient and scheduled the patient for a cardiac cath and this was done  by Dr. Tresa Endo on November 15, 2005 and it revealed an ejection fraction of 35-  40%. It also revealed significant coronary calcification involving the left  anterior descending artery, circumflex and right coronaries with a 60%  narrowing in the first diagonal branch of the left anterior descending  artery followed by 40-50% narrowing in the left anterior descending  artery, calcification was also in the proximal circumflex with 40% narrowing before  the obtuse marginal-1 takeoff extending to 60-70% narrowing after the obtuse  marginal-1 takeoff with 50% stenosis in this obtuse marginal-1 vessel and  diffuse 70-50% narrowing in the proximal right coronary artery followed by  40-50% narrowing in the mid-distal segment. Following this findings, Dr.  Tresa Endo recommended initial medical trial to optimize LV filling pressures and  treatment for known coronary artery disease. He stated that the patient  would ultimately require intervention and may need a high-speed rotational  atherectomy due to the  significant calcification but that this would be done  only after he failed medical trial. The patient's shortness of breath  resolved following diuresis. He has remained chest pain-free and is  hemodynamically stable. On rounds today, cardiology recommended that the  patient be discharged home to follow up with Dr. Tresa Endo in the two to three  weeks.   Problem 2:  CORONARY ARTERY DISEASE:  As discussed above, patient to follow  up with cardiology.   Problem 3:  ALCOHOL ABUSE:  The patient was started on an Ativan  detoxication protocol on admission and has completed it at this time. He  does not have any signs of withdrawal and he was seen by [facility] and  given information to follow up with the [facility].   Problem 4:  TOBACCO ABUSE:  The patient counseled to quit tobacco.   Problem 5:  HYPOKALEMIA:  The patient's potassium was replaced in the  hospital.   Problem 6:  LEUKOCYTOSIS:  It was likely secondary to stress demargination.  His white cell count had gone from 15.4 upon admission to 8.0 on recheck.   Problem 7:  HYPERTENSION:  The patient's blood pressure was well controlled  following the above interventions. He is to continue Lisinopril and  metoprolol upon discharge.   DISCHARGE MEDICATIONS:  1.  Aspirin 325 milligrams 1  p.o. daily.  2.  Digoxin 0.25 milligrams 1 p.o. daily.  3.  Lasix 40 milligrams p.o. b.i.d.  4.  Imdur 30 milligrams p.o. daily.  5.  Metoprolol 50 milligrams p.o. b.i.d.  6.  Multivitamin 1 p.o. daily.  7.  KCl 10 mEq daily.  8.  Spironolactone 12.5 milligrams 1 p.o. b.i.d.  9.  Thiamine 100 milligrams p.o. daily.  10. Lisinopril 40 milligrams 1 p.o. daily.   FOLLOW-UP CARE:  1.  Dr. Nicholaus Bloom in two to three weeks. Office to call the patient with      appointment.  2.  Health Service in one week.   DISCHARGE DIET:  Cardiac.   DISCHARGE CONDITION:  Improved/stable.           ______________________________  Kela Millin, M.D.     ACV/MEDQ  D:  11/16/2005  T:  11/16/2005  Job:  [id number]   cc:   Nicki Guadalajara, M.D.  Fax: [phone]   [facility]

## 2011-09-23 LAB — POCT I-STAT, CHEM 8
Calcium, Ion: 1.07 — ABNORMAL LOW
Chloride: 103
Glucose, Bld: 156 — ABNORMAL HIGH
HCT: 45
Hemoglobin: 15.3

## 2011-09-23 LAB — DIFFERENTIAL
Basophils Absolute: 0.1
Basophils Relative: 1
Eosinophils Relative: 1
Lymphocytes Relative: 16
Monocytes Absolute: 0.7
Neutro Abs: 8 — ABNORMAL HIGH

## 2011-09-23 LAB — D-DIMER, QUANTITATIVE: D-Dimer, Quant: 0.43

## 2011-09-23 LAB — CBC
Hemoglobin: 14.5
MCHC: 34.6
Platelets: 148 — ABNORMAL LOW
RDW: 12.9

## 2011-09-23 LAB — POCT CARDIAC MARKERS
Operator id: 285841
Troponin i, poc: <0.05

## 2011-10-02 LAB — POCT I-STAT, CHEM 8
Calcium, Ion: 0.97 — ABNORMAL LOW
Calcium, Ion: 1.1 — ABNORMAL LOW
Creatinine, Ser: 5.2 — ABNORMAL HIGH
Glucose, Bld: 104 — ABNORMAL HIGH
HCT: 37 — ABNORMAL LOW
HCT: 39
Hemoglobin: 13.3
Potassium: 4
Sodium: 137
TCO2: 19
TCO2: 22

## 2011-10-02 LAB — CULTURE, RESPIRATORY W GRAM STAIN: Culture: NORMAL

## 2011-10-02 LAB — DIFFERENTIAL
Basophils Relative: 0
Eosinophils Absolute: 0.1
Eosinophils Relative: 0
Lymphs Abs: 1.6
Monocytes Absolute: 1.3 — ABNORMAL HIGH
Monocytes Relative: 9

## 2011-10-02 LAB — URINALYSIS, ROUTINE W REFLEX MICROSCOPIC
Glucose, UA: NEGATIVE
Ketones, ur: NEGATIVE
Protein, ur: NEGATIVE
Urobilinogen, UA: 1

## 2011-10-02 LAB — CULTURE, BLOOD (ROUTINE X 2): Culture: NO GROWTH

## 2011-10-02 LAB — HEPATIC FUNCTION PANEL
Albumin: 3.2 — ABNORMAL LOW
Alkaline Phosphatase: 60
Bilirubin, Direct: 0.2
Total Bilirubin: 0.8

## 2011-10-02 LAB — CBC
HCT: 38.3 — ABNORMAL LOW
Hemoglobin: 12.8 — ABNORMAL LOW
MCHC: 33.4
MCV: 99.7
RBC: 3.84 — ABNORMAL LOW
WBC: 14.1 — ABNORMAL HIGH

## 2011-10-02 LAB — BASIC METABOLIC PANEL
GFR calc Af Amer: >60
GFR calc non Af Amer: 51 — ABNORMAL LOW
Potassium: 4.5
Sodium: 139

## 2011-10-02 LAB — EXPECTORATED SPUTUM ASSESSMENT W GRAM STAIN, RFLX TO RESP C

## 2012-02-22 ENCOUNTER — Inpatient Hospital Stay (HOSPITAL_COMMUNITY)
Admission: EM | Admit: 2012-02-22 | Discharge: 2012-02-27 | DRG: 208 | Disposition: A | Discharge status: Home Health | Payer: Medicare Other | Attending: Internal Medicine | Admitting: Internal Medicine

## 2012-02-22 ENCOUNTER — Emergency Department (HOSPITAL_COMMUNITY): Payer: Medicare Other

## 2012-02-22 ENCOUNTER — Inpatient Hospital Stay (HOSPITAL_COMMUNITY): Payer: Medicare Other

## 2012-02-22 ENCOUNTER — Encounter (HOSPITAL_COMMUNITY): Payer: Self-pay | Admitting: Emergency Medicine

## 2012-02-22 ENCOUNTER — Other Ambulatory Visit: Payer: Self-pay

## 2012-02-22 DIAGNOSIS — F17200 Nicotine dependence, unspecified, uncomplicated: Secondary | ICD-10-CM | POA: Diagnosis present

## 2012-02-22 DIAGNOSIS — G9340 Encephalopathy, unspecified: Secondary | ICD-10-CM | POA: Diagnosis present

## 2012-02-22 DIAGNOSIS — Z781 Physical restraint status: Secondary | ICD-10-CM | POA: Diagnosis not present

## 2012-02-22 DIAGNOSIS — I5023 Acute on chronic systolic (congestive) heart failure: Secondary | ICD-10-CM | POA: Diagnosis present

## 2012-02-22 DIAGNOSIS — J9600 Acute respiratory failure, unspecified whether with hypoxia or hypercapnia: Secondary | ICD-10-CM

## 2012-02-22 DIAGNOSIS — Z79899 Other long term (current) drug therapy: Secondary | ICD-10-CM

## 2012-02-22 DIAGNOSIS — I10 Essential (primary) hypertension: Secondary | ICD-10-CM | POA: Diagnosis present

## 2012-02-22 DIAGNOSIS — I5032 Chronic diastolic (congestive) heart failure: Secondary | ICD-10-CM | POA: Diagnosis present

## 2012-02-22 DIAGNOSIS — M109 Gout, unspecified: Secondary | ICD-10-CM | POA: Diagnosis present

## 2012-02-22 DIAGNOSIS — E119 Type 2 diabetes mellitus without complications: Secondary | ICD-10-CM | POA: Diagnosis present

## 2012-02-22 DIAGNOSIS — E785 Hyperlipidemia, unspecified: Secondary | ICD-10-CM | POA: Diagnosis present

## 2012-02-22 DIAGNOSIS — I509 Heart failure, unspecified: Secondary | ICD-10-CM

## 2012-02-22 DIAGNOSIS — J441 Chronic obstructive pulmonary disease with (acute) exacerbation: Secondary | ICD-10-CM | POA: Diagnosis present

## 2012-02-22 DIAGNOSIS — I252 Old myocardial infarction: Secondary | ICD-10-CM

## 2012-02-22 DIAGNOSIS — J449 Chronic obstructive pulmonary disease, unspecified: Secondary | ICD-10-CM

## 2012-02-22 DIAGNOSIS — I2510 Atherosclerotic heart disease of native coronary artery without angina pectoris: Secondary | ICD-10-CM

## 2012-02-22 DIAGNOSIS — J189 Pneumonia, unspecified organism: Secondary | ICD-10-CM | POA: Diagnosis present

## 2012-02-22 HISTORY — DX: Acute myocardial infarction, unspecified: I21.9

## 2012-02-22 HISTORY — DX: Hyperlipidemia, unspecified: E78.5

## 2012-02-22 HISTORY — DX: Heart failure, unspecified: I50.9

## 2012-02-22 HISTORY — DX: Essential (primary) hypertension: I10

## 2012-02-22 HISTORY — DX: Chronic obstructive pulmonary disease, unspecified: J44.9

## 2012-02-22 LAB — BLOOD GAS, ARTERIAL
Acid-Base Excess: 3.6 mmol/L — ABNORMAL HIGH (ref 0.0–2.0)
Acid-Base Excess: 2.9 mmol/L — ABNORMAL HIGH (ref 0.0–2.0)
Bicarbonate: 34.9 mEq/L — ABNORMAL HIGH (ref 20.0–24.0)
Bicarbonate: 34.5 mEq/L — ABNORMAL HIGH (ref 20.0–24.0)
Delivery systems: POSITIVE
O2 Content: 6 L/min
O2 Saturation: 90.8 %
PEEP: 5 cmH2O
Patient temperature: 98.6
TCO2: 32.2 mmol/L (ref 0–100)
pCO2 arterial: 75.2 mmHg (ref 35.0–45.0)
pCO2 arterial: 94.8 mmHg (ref 35.0–45.0)
pCO2 arterial: 85.3 mmHg (ref 35.0–45.0)
pH, Arterial: 7.191 — CL (ref 7.350–7.450)
pH, Arterial: 7.231 — ABNORMAL LOW (ref 7.350–7.450)
pO2, Arterial: 60.1 mmHg — ABNORMAL LOW (ref 80.0–100.0)
pO2, Arterial: 86.1 mmHg (ref 80.0–100.0)
pO2, Arterial: 66.5 mmHg — ABNORMAL LOW (ref 80.0–100.0)

## 2012-02-22 LAB — BASIC METABOLIC PANEL
BUN: 16 mg/dL (ref 6–23)
CO2: 30 mEq/L (ref 19–32)
Calcium: 8.9 mg/dL (ref 8.4–10.5)
Calcium: 8.4 mg/dL (ref 8.4–10.5)
GFR calc Af Amer: 49 mL/min — ABNORMAL LOW (ref >90)
GFR calc non Af Amer: 43 mL/min — ABNORMAL LOW (ref >90)
Glucose, Bld: 152 mg/dL — ABNORMAL HIGH (ref 70–99)
Glucose, Bld: 157 mg/dL — ABNORMAL HIGH (ref 70–99)
Potassium: 3.9 mEq/L (ref 3.5–5.1)
Sodium: 133 mEq/L — ABNORMAL LOW (ref 135–145)

## 2012-02-22 LAB — DIFFERENTIAL
Eosinophils Absolute: 0.2 10*3/uL (ref 0.0–0.7)
Lymphs Abs: 1.6 10*3/uL (ref 0.7–4.0)
Monocytes Relative: 13 % — ABNORMAL HIGH (ref 3–12)
Neutrophils Relative %: 71 % (ref 43–77)

## 2012-02-22 LAB — HEMOGLOBIN A1C
Hgb A1c MFr Bld: 5.2 % (ref <5.7)
Mean Plasma Glucose: 103 mg/dL (ref <117)

## 2012-02-22 LAB — CARDIAC PANEL(CRET KIN+CKTOT+MB+TROPI)
CK, MB: 3.8 ng/mL (ref 0.3–4.0)
CK, MB: 3.6 ng/mL (ref 0.3–4.0)
CK, MB: 3.7 ng/mL (ref 0.3–4.0)
Relative Index: 3.5 — ABNORMAL HIGH (ref 0.0–2.5)
Total CK: 110 U/L (ref 7–232)
Troponin I: <0.3 ng/mL (ref <0.30)
Troponin I: <0.3 ng/mL (ref <0.30)
Troponin I: <0.3 ng/mL (ref <0.30)

## 2012-02-22 LAB — GLUCOSE, CAPILLARY
Glucose-Capillary: 101 mg/dL — ABNORMAL HIGH (ref 70–99)
Glucose-Capillary: 95 mg/dL (ref 70–99)
Glucose-Capillary: 95 mg/dL (ref 70–99)
Glucose-Capillary: 150 mg/dL — ABNORMAL HIGH (ref 70–99)

## 2012-02-22 LAB — URINALYSIS, ROUTINE W REFLEX MICROSCOPIC
Glucose, UA: NEGATIVE mg/dL
Hgb urine dipstick: NEGATIVE
Protein, ur: NEGATIVE mg/dL
Specific Gravity, Urine: 1.011 (ref 1.005–1.030)
pH: 5.5 (ref 5.0–8.0)

## 2012-02-22 LAB — CBC
Hemoglobin: 16.1 g/dL (ref 13.0–17.0)
MCH: 34 pg (ref 26.0–34.0)
MCV: 105.1 fL — ABNORMAL HIGH (ref 78.0–100.0)
RBC: 4.73 MIL/uL (ref 4.22–5.81)

## 2012-02-22 LAB — ETHANOL: Alcohol, Ethyl (B): <11 mg/dL (ref 0–11)

## 2012-02-22 MED ORDER — IPRATROPIUM BROMIDE 0.02 % IN SOLN
0.5000 mg | RESPIRATORY_TRACT | Status: AC
  Administered 2012-02-22: 0.5 mg via RESPIRATORY_TRACT

## 2012-02-22 MED ORDER — NITROGLYCERIN IN D5W 200-5 MCG/ML-% IV SOLN
20.0000 ug/min | Freq: Once | INTRAVENOUS | Status: AC
  Administered 2012-02-22: 20 ug/min via INTRAVENOUS
  Filled 2012-02-22: qty 250

## 2012-02-22 MED ORDER — SODIUM CHLORIDE 0.9 % IV SOLN
50.0000 ug/h | INTRAVENOUS | Status: DC
  Administered 2012-02-22 (×2): 50 ug/h via INTRAVENOUS
  Administered 2012-02-23: 100 ug/h via INTRAVENOUS
  Filled 2012-02-22 (×2): qty 50

## 2012-02-22 MED ORDER — BIOTENE DRY MOUTH MT LIQD
15.0000 mL | Freq: Four times a day (QID) | OROMUCOSAL | Status: DC
  Administered 2012-02-22 – 2012-02-24 (×6): 15 mL via OROMUCOSAL

## 2012-02-22 MED ORDER — ALBUTEROL SULFATE (5 MG/ML) 0.5% IN NEBU
2.5000 mg | INHALATION_SOLUTION | RESPIRATORY_TRACT | Status: DC | PRN
  Filled 2012-02-22 (×2): qty 0.5

## 2012-02-22 MED ORDER — FUROSEMIDE 10 MG/ML IJ SOLN
40.0000 mg | Freq: Two times a day (BID) | INTRAMUSCULAR | Status: DC
  Administered 2012-02-22 – 2012-02-23 (×2): 40 mg via INTRAVENOUS
  Filled 2012-02-22 (×4): qty 4

## 2012-02-22 MED ORDER — SODIUM CHLORIDE 0.9 % IJ SOLN
3.0000 mL | Freq: Two times a day (BID) | INTRAMUSCULAR | Status: DC
  Administered 2012-02-22 – 2012-02-24 (×4): 3 mL via INTRAVENOUS

## 2012-02-22 MED ORDER — CHLORHEXIDINE GLUCONATE 0.12 % MT SOLN
15.0000 mL | Freq: Two times a day (BID) | OROMUCOSAL | Status: DC
  Administered 2012-02-22 – 2012-02-24 (×4): 15 mL via OROMUCOSAL
  Filled 2012-02-22 (×7): qty 15

## 2012-02-22 MED ORDER — PROPOFOL 10 MG/ML IV EMUL
INTRAVENOUS | Status: AC
  Administered 2012-02-22: 25 ug/kg/min via INTRAVENOUS
  Filled 2012-02-22: qty 100

## 2012-02-22 MED ORDER — ALBUTEROL SULFATE (5 MG/ML) 0.5% IN NEBU
5.0000 mg | INHALATION_SOLUTION | RESPIRATORY_TRACT | Status: AC
  Administered 2012-02-22: 5 mg via RESPIRATORY_TRACT

## 2012-02-22 MED ORDER — INSULIN ASPART 100 UNIT/ML ~~LOC~~ SOLN: 0.0000 [IU] | Freq: Every day | SUBCUTANEOUS | Status: DC

## 2012-02-22 MED ORDER — LORAZEPAM 2 MG/ML IJ SOLN
INTRAMUSCULAR | Status: AC
  Filled 2012-02-22: qty 1

## 2012-02-22 MED ORDER — CLONIDINE HCL 0.1 MG/24HR TD PTWK
0.1000 mg | MEDICATED_PATCH | TRANSDERMAL | Status: DC
  Administered 2012-02-22: 0.1 mg via TRANSDERMAL
  Filled 2012-02-22: qty 1

## 2012-02-22 MED ORDER — METOPROLOL TARTRATE 1 MG/ML IV SOLN
5.0000 mg | Freq: Four times a day (QID) | INTRAVENOUS | Status: DC
  Administered 2012-02-22 – 2012-02-23 (×4): 5 mg via INTRAVENOUS
  Filled 2012-02-22 (×13): qty 5

## 2012-02-22 MED ORDER — ALBUTEROL SULFATE (5 MG/ML) 0.5% IN NEBU
INHALATION_SOLUTION | RESPIRATORY_TRACT | Status: AC
  Filled 2012-02-22: qty 0.5

## 2012-02-22 MED ORDER — IPRATROPIUM BROMIDE 0.02 % IN SOLN
RESPIRATORY_TRACT | Status: AC
  Filled 2012-02-22: qty 2.5

## 2012-02-22 MED ORDER — FENTANYL BOLUS VIA INFUSION
50.0000 ug | Freq: Four times a day (QID) | INTRAVENOUS | Status: DC | PRN
  Filled 2012-02-22: qty 100

## 2012-02-22 MED ORDER — ALBUTEROL SULFATE (5 MG/ML) 0.5% IN NEBU
2.5000 mg | INHALATION_SOLUTION | Freq: Four times a day (QID) | RESPIRATORY_TRACT | Status: DC
  Administered 2012-02-22 – 2012-02-25 (×11): 2.5 mg via RESPIRATORY_TRACT
  Filled 2012-02-22 (×9): qty 0.5

## 2012-02-22 MED ORDER — IPRATROPIUM BROMIDE 0.02 % IN SOLN
0.5000 mg | Freq: Four times a day (QID) | RESPIRATORY_TRACT | Status: DC
  Administered 2012-02-22 – 2012-02-25 (×11): 0.5 mg via RESPIRATORY_TRACT
  Filled 2012-02-22 (×11): qty 2.5

## 2012-02-22 MED ORDER — ENOXAPARIN SODIUM 40 MG/0.4ML ~~LOC~~ SOLN
40.0000 mg | SUBCUTANEOUS | Status: DC
  Administered 2012-02-22 – 2012-02-27 (×6): 40 mg via SUBCUTANEOUS
  Filled 2012-02-22 (×6): qty 0.4

## 2012-02-22 MED ORDER — PROPOFOL 10 MG/ML IV EMUL
5.0000 ug/kg/min | INTRAVENOUS | Status: DC
  Administered 2012-02-22: 25 ug/kg/min via INTRAVENOUS
  Administered 2012-02-23 (×4): 30 ug/kg/min via INTRAVENOUS
  Filled 2012-02-22 (×5): qty 100

## 2012-02-22 MED ORDER — ASPIRIN EC 325 MG PO TBEC
325.0000 mg | DELAYED_RELEASE_TABLET | Freq: Every day | ORAL | Status: DC
  Administered 2012-02-22: 325 mg via ORAL
  Filled 2012-02-22 (×2): qty 1

## 2012-02-22 MED ORDER — ISOSORB DINITRATE-HYDRALAZINE 20-37.5 MG PO TABS
1.0000 | ORAL_TABLET | Freq: Three times a day (TID) | ORAL | Status: DC
  Administered 2012-02-22: 1 via ORAL
  Filled 2012-02-22 (×6): qty 1

## 2012-02-22 MED ORDER — SODIUM CHLORIDE 0.9 % IV SOLN: 250.0000 mL | INTRAVENOUS | Status: DC | PRN

## 2012-02-22 MED ORDER — ASPIRIN 325 MG PO TABS
325.0000 mg | ORAL_TABLET | ORAL | Status: AC
  Administered 2012-02-22: 325 mg via ORAL
  Filled 2012-02-22: qty 1

## 2012-02-22 MED ORDER — METHYLPREDNISOLONE SODIUM SUCC 125 MG IJ SOLR
125.0000 mg | INTRAMUSCULAR | Status: AC
  Administered 2012-02-22: 125 mg via INTRAVENOUS
  Filled 2012-02-22: qty 2

## 2012-02-22 MED ORDER — LORAZEPAM 2 MG/ML PO CONC
0.5000 mg | Freq: Once | ORAL | Status: AC
  Administered 2012-02-22: 0.5 mg via ORAL

## 2012-02-22 MED ORDER — MOXIFLOXACIN HCL IN NACL 400 MG/250ML IV SOLN
400.0000 mg | INTRAVENOUS | Status: DC
  Administered 2012-02-22 – 2012-02-25 (×4): 400 mg via INTRAVENOUS
  Filled 2012-02-22 (×5): qty 250

## 2012-02-22 MED ORDER — SODIUM CHLORIDE 0.9 % IJ SOLN: 3.0000 mL | INTRAMUSCULAR | Status: DC | PRN

## 2012-02-22 MED ORDER — INSULIN ASPART 100 UNIT/ML ~~LOC~~ SOLN
0.0000 [IU] | Freq: Three times a day (TID) | SUBCUTANEOUS | Status: DC
  Administered 2012-02-22: 3 [IU] via SUBCUTANEOUS
  Filled 2012-02-22: qty 3

## 2012-02-22 MED ORDER — SODIUM CHLORIDE 0.9 % IJ SOLN
3.0000 mL | Freq: Two times a day (BID) | INTRAMUSCULAR | Status: DC
  Administered 2012-02-22 – 2012-02-25 (×8): 3 mL via INTRAVENOUS
  Administered 2012-02-26: 11:00:00 via INTRAVENOUS
  Administered 2012-02-26: 3 mL via INTRAVENOUS

## 2012-02-22 MED ORDER — ALBUTEROL SULFATE (5 MG/ML) 0.5% IN NEBU
INHALATION_SOLUTION | RESPIRATORY_TRACT | Status: AC
  Administered 2012-02-23: 2.5 mg via RESPIRATORY_TRACT
  Filled 2012-02-22: qty 1

## 2012-02-22 MED ORDER — ASPIRIN 300 MG RE SUPP
300.0000 mg | Freq: Every day | RECTAL | Status: DC
  Administered 2012-02-22 – 2012-02-24 (×3): 300 mg via RECTAL
  Filled 2012-02-22 (×5): qty 1

## 2012-02-22 MED ORDER — AMLODIPINE BESYLATE 10 MG PO TABS
10.0000 mg | ORAL_TABLET | Freq: Every day | ORAL | Status: DC
  Administered 2012-02-22: 10 mg via ORAL
  Filled 2012-02-22 (×2): qty 1

## 2012-02-22 MED ORDER — MORPHINE SULFATE 2 MG/ML IJ SOLN: 2.0000 mg | INTRAMUSCULAR | Status: DC | PRN

## 2012-02-22 MED ORDER — INSULIN ASPART 100 UNIT/ML ~~LOC~~ SOLN: 4.0000 [IU] | Freq: Three times a day (TID) | SUBCUTANEOUS | Status: DC

## 2012-02-22 MED ORDER — FENOFIBRATE 54 MG PO TABS
54.0000 mg | ORAL_TABLET | Freq: Every day | ORAL | Status: DC
  Administered 2012-02-22 – 2012-02-27 (×6): 54 mg via ORAL
  Filled 2012-02-22 (×6): qty 1

## 2012-02-22 MED ORDER — CARVEDILOL 6.25 MG PO TABS
6.2500 mg | ORAL_TABLET | Freq: Two times a day (BID) | ORAL | Status: DC
  Administered 2012-02-22: 6.25 mg via ORAL
  Filled 2012-02-22 (×4): qty 1

## 2012-02-22 NOTE — ED Notes (Signed)
Pt EMS: Pt with acute onset of shortness of breath, hx of CHF. Rales in all fields, labored breathing on EMS arrival. PIV in L A/C, received 50mg  Lasix IV, Nitro sl x 1. Has previously required intubation. Placed on CPAP. Pt complains of chest pain

## 2012-02-22 NOTE — H&P (Signed)
PCP: None    Chief Complaint: Progressive shortness of breath for the past 3 days.   HPI: John [name] is an 66 y.o. male with history of COPD, coronary disease status post prior MI, congestive heart failure, diabetes, and unfortunately active tobacco use, hypertension, gout, hyperlipidemia, presents to [facility] emergency room with severe shortness of breath, yellow sputum productive cough for the past 3 days, but no chest pain. He has been worked to sleep flat without any orthopnea or PND. He denied any lower leg swelling. Evaluation in emergency room included a chest x-ray which show no definite infiltrate, but some interstitial edema. His EKG showed no acute ST-T changes and his current markers were negative. His BNP is elevated at 1600, his cardiac markers was negative, he has normal renal function tests as well. He was placed on BiPAP originally and after several attempts, he is currently able to tolerate 100% nonrebreather mask with 100% saturation. Hospitalist was asked to admit patient for question of congestive heart failure.  Rewiew of Systems:  The patient denies anorexia, fever, weight loss,, vision loss, decreased hearing, hoarseness, chest pain, syncope, , peripheral edema, balance deficits, hemoptysis, abdominal pain, melena, hematochezia, severe indigestion/heartburn, hematuria, incontinence, genital sores, muscle weakness, suspicious skin lesions, transient blindness, difficulty walking, depression, unusual weight change, abnormal bleeding, enlarged lymph nodes, angioedema, and breast masses.    Past Medical History  Diagnosis Date  . CHF (congestive heart failure)   . MI (myocardial infarction)   . COPD (chronic obstructive pulmonary disease)   . Diabetes mellitus   . Hypertension   . Gout   . Hyperlipemia     Past Surgical History  Procedure Date  . Appendectomy     Medications:  HOME MEDS: Prior to Admission medications   Medication Sig Start Date End Date  Taking? Authorizing Provider  amLODipine (NORVASC) 10 MG tablet Take 10 mg by mouth daily.   Yes Historical Provider, MD  aspirin EC 325 MG tablet Take 325 mg by mouth daily.   Yes Historical Provider, MD  carvedilol (COREG) 6.25 MG tablet Take 6.25 mg by mouth 2 (two) times daily with a meal.   Yes Historical Provider, MD  colchicine 0.6 MG tablet Take 0.6 mg by mouth daily as needed. Gout flair ups   Yes Historical Provider, MD  fenofibrate (TRICOR) 48 MG tablet Take 48 mg by mouth daily.   Yes Historical Provider, MD  furosemide (LASIX) 20 MG tablet Take 20 mg by mouth daily.   Yes Historical Provider, MD  isosorbide-hydrALAZINE (BIDIL) 20-37.5 MG per tablet Take 1 tablet by mouth 3 (three) times daily.   Yes Historical Provider, MD  ketoprofen (ORUDIS) 50 MG capsule Take 50 mg by mouth 2 (two) times daily.   Yes Historical Provider, MD     Allergies:  Allergies  Allergen Reactions  . Lisinopril Swelling    Social History:   reports that he has been smoking.  He does not have any smokeless tobacco history on file. He reports that he drinks alcohol. His drug history not on file.  Family History: History reviewed. No pertinent family history.   Physical Exam: Filed Vitals:   02/22/12 0256 02/22/12 0300 02/22/12 0500 02/22/12 0540  BP: 131/74 118/60 127/59   Pulse: 78 77 70   Temp: 98.7 F (37.1 C)     TempSrc: Rectal     Resp: 24 22 12    SpO2: 100% 99% 92% 100%   Blood pressure 127/59, pulse 70, temperature 98.7 F (  37.1 C), temperature source Rectal, resp. rate 12, SpO2 100.00%.  GEN:  Pleasant person lying in the stretcher in no acute distress; cooperative with exam PSYCH:  alert and oriented x4; does not appear anxious does not appear depressed; affect is normal HEENT: Mucous membranes pink and anicteric; PERRLA; EOM intact; no cervical lymphadenopathy nor thyromegaly or carotid bruit; no JVD; Breasts:: Not examined CHEST WALL: No tenderness CHEST: He has shallow  breath sounds. There are bibasilar crackles HEART: Regular rate and rhythm; no murmurs rubs or gallops BACK: No kyphosis or scoliosis; no CVA tenderness ABDOMEN: Obese, soft non-tender; no masses, no organomegaly, normal abdominal bowel sounds; no pannus; no intertriginous candida. Rectal Exam: Not done EXTREMITIES: No bone or joint deformity; age-appropriate arthropathy of the hands and knees; no edema; no ulcerations. Genitalia: not examined PULSES: 2+ and symmetric SKIN: Normal hydration no rash or ulceration CNS: Cranial nerves 2-12 grossly intact no focal neurologic deficit   Labs & Imaging Results for orders placed during the hospital encounter of 02/22/12 (from the past 48 hour(s))  CBC     Status: Abnormal   Collection Time   02/22/12  2:30 AM      Component Value Range Comment   WBC 10.8 (*) 4.0 - 10.5 (K/uL)    RBC 4.73  4.22 - 5.81 (MIL/uL)    Hemoglobin 16.1  13.0 - 17.0 (g/dL)    HCT 11.9  14.7 - 82.9 (%)    MCV 105.1 (*) 78.0 - 100.0 (fL)    MCH 34.0  26.0 - 34.0 (pg)    MCHC 32.4  30.0 - 36.0 (g/dL)    RDW 56.2  13.0 - 86.5 (%)    Platelets 180  150 - 400 (K/uL)   BASIC METABOLIC PANEL     Status: Abnormal   Collection Time   02/22/12  2:30 AM      Component Value Range Comment   Sodium 133 (*) 135 - 145 (mEq/L)    Potassium 3.8  3.5 - 5.1 (mEq/L)    Chloride 92 (*) 96 - 112 (mEq/L)    CO2 30  19 - 32 (mEq/L)    Glucose, Bld 152 (*) 70 - 99 (mg/dL)    BUN 12  6 - 23 (mg/dL)    Creatinine, Ser 7.84  0.50 - 1.35 (mg/dL)    Calcium 8.9  8.4 - 10.5 (mg/dL)    GFR calc non Af Amer 87 (*) >90 (mL/min)    GFR calc Af Amer >90  >90 (mL/min)   PRO B NATRIURETIC PEPTIDE     Status: Abnormal   Collection Time   02/22/12  2:30 AM      Component Value Range Comment   Pro B Natriuretic peptide (BNP) 1675.0 (*) 0 - 125 (pg/mL)   DIFFERENTIAL     Status: Abnormal   Collection Time   02/22/12  2:30 AM      Component Value Range Comment   Neutrophils Relative 71  43 - 77 (%)     Neutro Abs 7.7  1.7 - 7.7 (K/uL)    Lymphocytes Relative 14  12 - 46 (%)    Lymphs Abs 1.6  0.7 - 4.0 (K/uL)    Monocytes Relative 13 (*) 3 - 12 (%)    Monocytes Absolute 1.4 (*) 0.1 - 1.0 (K/uL)    Eosinophils Relative 2  0 - 5 (%)    Eosinophils Absolute 0.2  0.0 - 0.7 (K/uL)    Basophils Relative 1  0 - 1 (%)  Basophils Absolute 0.1  0.0 - 0.1 (K/uL)   CARDIAC PANEL(CRET KIN+CKTOT+MB+TROPI)     Status: Abnormal   Collection Time   02/22/12  2:36 AM      Component Value Range Comment   Total CK 110  7 - 232 (U/L)    CK, MB 3.8  0.3 - 4.0 (ng/mL)    Troponin I <0.30  <0.30 (ng/mL)    Relative Index 3.5 (*) 0.0 - 2.5    URINALYSIS, ROUTINE W REFLEX MICROSCOPIC     Status: Normal   Collection Time   02/22/12  2:38 AM      Component Value Range Comment   Color, Urine YELLOW  YELLOW     APPearance CLEAR  CLEAR     Specific Gravity, Urine 1.011  1.005 - 1.030     pH 5.5  5.0 - 8.0     Glucose, UA NEGATIVE  NEGATIVE (mg/dL)    Hgb urine dipstick NEGATIVE  NEGATIVE     Bilirubin Urine NEGATIVE  NEGATIVE     Ketones, ur NEGATIVE  NEGATIVE (mg/dL)    Protein, ur NEGATIVE  NEGATIVE (mg/dL)    Urobilinogen, UA 1.0  0.0 - 1.0 (mg/dL)    Nitrite NEGATIVE  NEGATIVE     Leukocytes, UA NEGATIVE  NEGATIVE  MICROSCOPIC NOT DONE ON URINES WITH NEGATIVE PROTEIN, BLOOD, LEUKOCYTES, NITRITE, OR GLUCOSE <1000 mg/dL.  POCT I-STAT TROPONIN I     Status: Normal   Collection Time   02/22/12  2:44 AM      Component Value Range Comment   Troponin i, poc 0.01  0.00 - 0.08 (ng/mL)    Comment 3            ETHANOL     Status: Normal   Collection Time   02/22/12  3:20 AM      Component Value Range Comment   Alcohol, Ethyl (B) <11  0 - 11 (mg/dL)    Dg Chest Port 1 View  02/22/2012  *RADIOLOGY REPORT*  Clinical Data: Shortness of breath, chest pain.  PORTABLE CHEST - 1 VIEW  Comparison: 04/09/2010  Findings: Mild cardiomegaly with vascular congestion and diffuse interstitial prominence, likely mild  interstitial edema.  No effusions.  No acute bony abnormality.  IMPRESSION: Cardiomegaly.  Diffuse interstitial prominence suggesting interstitial edema.  Original Report Authenticated By: Cyndie Chime, M.D.      Assessment Present on Admission:  .COPD exacerbation .CHF (congestive heart failure) .DM (diabetes mellitus) .HTN (hypertension) Coronary disease status post MI  PLAN: I believe his main problem is COPD exacerbation, but he also has a component of heart failure as well. It is possible that this is diastolic heart failure.  We'll admit him to telemetry, continue to cycle his Markers, obtain cardiac echo. I suspect he'll benefit have an antibiotic, and Avelox will be given. His home medication will be continued. He is stable, full code, and will be admitted to triad hospitalist service. For his diabetes, he'll be on modified carbohydrate diet along with insulin.   Other plans as per orders.    John [name] 02/22/2012, 6:08 AM

## 2012-02-22 NOTE — Procedures (Signed)
Name: [name] MRN: [id number] DOB: [birth year]  DOS: 02/22/12  PROCEDURE NOTE  Procedure:  Endotracheal intubation.  Indication:  Acute respiratory failure  Consent:  Consent was implied due to the emergency nature of the procedure.  Anesthesia:  A total of 10 mg of Etomidate was given intravenously.  Procedure summary:  Appropriate equipment was assembled. The patient was identified as John [name] and safety timeout was performed. The patient was placed supine, with head in sniffing position. After adequate level of anesthesia was achieved, a glidescope blade was inserted into the oropharynx and the vocal cords were visualized. A 7.8 endotracheal tube was inserted without difficulty and visualized going through the vocal cords. The stylette was removed and cuff inflated. Colorimetric change was noted on the CO2 meter. Breath sounds were heard over both lung fields equally. Post procedure chest xray was ordered.  Complications:  No immediate complications were noted.  Hemodynamic parameters and oxygenation remained stable throughout the procedure.    Catha Brow, MD  02/22/2012, 9:39 PM

## 2012-02-22 NOTE — Consult Note (Signed)
Name: John [name] MRN: [id number] DOB: [birth year]  DOS:   02/22/12    CRITICAL CARE MEDICINE ADMISSION / CONSULTATION NOTE  Referring Physician: Dr Ashley [name]  Reason For Consult : Hypercapnic respiratory failure  The patient is sedated, intubated and unable to provide history, which was obtained for available medical records.    History Of Present Illness : The patient is a 65/M with multiple medical problems including COPD and CHF who was admitted to for COPD and CHF exacerbation. He was being treated for both the conditions. He was placed on BIPAP due to hypercapnic respiratory failure but continued to get acidotic and somnolent and hence we intubated him for Type II respiratory failure.  Past Medical History  Diagnosis Date  . CHF (congestive heart failure)   . MI (myocardial infarction)   . COPD (chronic obstructive pulmonary disease)   . Diabetes mellitus   . Hypertension   . Gout   . Hyperlipemia     Past Surgical History  Procedure Date  . Appendectomy     Prior to Admission medications   Medication Sig Start Date End Date Taking? Authorizing Provider  amLODipine (NORVASC) 10 MG tablet Take 10 mg by mouth daily.   Yes Historical Provider, MD  aspirin EC 325 MG tablet Take 325 mg by mouth daily.   Yes Historical Provider, MD  carvedilol (COREG) 6.25 MG tablet Take 6.25 mg by mouth 2 (two) times daily with a meal.   Yes Historical Provider, MD  colchicine 0.6 MG tablet Take 0.6 mg by mouth daily as needed. Gout flair ups   Yes Historical Provider, MD  fenofibrate (TRICOR) 48 MG tablet Take 48 mg by mouth daily.   Yes Historical Provider, MD  furosemide (LASIX) 20 MG tablet Take 20 mg by mouth daily.   Yes Historical Provider, MD  isosorbide-hydrALAZINE (BIDIL) 20-37.5 MG per tablet Take 1 tablet by mouth 3 (three) times daily.   Yes Historical Provider, MD  ketoprofen (ORUDIS) 50 MG capsule Take 50 mg by mouth 2 (two) times daily.   Yes Historical Provider, MD     Allergies  Allergen Reactions  . Lisinopril Swelling    History reviewed. No pertinent family history.  Social History  reports that he has been smoking.  He does not have any smokeless tobacco history on file. He reports that he drinks alcohol. His drug history not on file.  Review Of Systems  11 points review of systems is negative with an exception of listed in HPI.  Physical Examination  BP 125/55  Pulse 82  Temp(Src) 97.8 F (36.6 C) (Axillary)  Resp 26  Ht 5\' 6"  (1.676 m)  Wt 87.2 kg (192 lb 3.9 oz)  BMI 31.03 kg/m2  SpO2 100%  Neuro:  Sedated, intubated HEENT:  ETT/OGT Heart:  RRR, no M/R/G Lungs:  Bilateral air entry, no W/R/R Abdomen:  Soft, non tender, bowel sounds present Extremities: No cyanosis, clubbing or edema.  Labs  Results for orders placed during the hospital encounter of 02/22/12 (from the past 24 hour(s))  CBC     Status: Abnormal   Collection Time   02/22/12  2:30 AM      Component Value Range   WBC 10.8 (*) 4.0 - 10.5 (K/uL)   RBC 4.73  4.22 - 5.81 (MIL/uL)   Hemoglobin 16.1  13.0 - 17.0 (g/dL)   HCT 40.9  81.1 - 91.4 (%)   MCV 105.1 (*) 78.0 - 100.0 (fL)   MCH 34.0  26.0 - 34.0 (  pg)   MCHC 32.4  30.0 - 36.0 (g/dL)   RDW 53.6  64.4 - 03.4 (%)   Platelets 180  150 - 400 (K/uL)  BASIC METABOLIC PANEL     Status: Abnormal   Collection Time   02/22/12  2:30 AM      Component Value Range   Sodium 133 (*) 135 - 145 (mEq/L)   Potassium 3.8  3.5 - 5.1 (mEq/L)   Chloride 92 (*) 96 - 112 (mEq/L)   CO2 30  19 - 32 (mEq/L)   Glucose, Bld 152 (*) 70 - 99 (mg/dL)   BUN 12  6 - 23 (mg/dL)   Creatinine, Ser 7.42  0.50 - 1.35 (mg/dL)   Calcium 8.9  8.4 - 59.5 (mg/dL)   GFR calc non Af Amer 87 (*) >90 (mL/min)   GFR calc Af Amer >90  >90 (mL/min)  PRO B NATRIURETIC PEPTIDE     Status: Abnormal   Collection Time   02/22/12  2:30 AM      Component Value Range   Pro B Natriuretic peptide (BNP) 1675.0 (*) 0 - 125 (pg/mL)  DIFFERENTIAL     Status:  Abnormal   Collection Time   02/22/12  2:30 AM      Component Value Range   Neutrophils Relative 71  43 - 77 (%)   Neutro Abs 7.7  1.7 - 7.7 (K/uL)   Lymphocytes Relative 14  12 - 46 (%)   Lymphs Abs 1.6  0.7 - 4.0 (K/uL)   Monocytes Relative 13 (*) 3 - 12 (%)   Monocytes Absolute 1.4 (*) 0.1 - 1.0 (K/uL)   Eosinophils Relative 2  0 - 5 (%)   Eosinophils Absolute 0.2  0.0 - 0.7 (K/uL)   Basophils Relative 1  0 - 1 (%)   Basophils Absolute 0.1  0.0 - 0.1 (K/uL)  CARDIAC PANEL(CRET KIN+CKTOT+MB+TROPI)     Status: Abnormal   Collection Time   02/22/12  2:36 AM      Component Value Range   Total CK 110  7 - 232 (U/L)   CK, MB 3.8  0.3 - 4.0 (ng/mL)   Troponin I <0.30  <0.30 (ng/mL)   Relative Index 3.5 (*) 0.0 - 2.5   URINALYSIS, ROUTINE W REFLEX MICROSCOPIC     Status: Normal   Collection Time   02/22/12  2:38 AM      Component Value Range   Color, Urine YELLOW  YELLOW    APPearance CLEAR  CLEAR    Specific Gravity, Urine 1.011  1.005 - 1.030    pH 5.5  5.0 - 8.0    Glucose, UA NEGATIVE  NEGATIVE (mg/dL)   Hgb urine dipstick NEGATIVE  NEGATIVE    Bilirubin Urine NEGATIVE  NEGATIVE    Ketones, ur NEGATIVE  NEGATIVE (mg/dL)   Protein, ur NEGATIVE  NEGATIVE (mg/dL)   Urobilinogen, UA 1.0  0.0 - 1.0 (mg/dL)   Nitrite NEGATIVE  NEGATIVE    Leukocytes, UA NEGATIVE  NEGATIVE   POCT I-STAT TROPONIN I     Status: Normal   Collection Time   02/22/12  2:44 AM      Component Value Range   Troponin i, poc 0.01  0.00 - 0.08 (ng/mL)   Comment 3           ETHANOL     Status: Normal   Collection Time   02/22/12  3:20 AM      Component Value Range   Alcohol, Ethyl (  B) <11  0 - 11 (mg/dL)  TSH     Status: Normal   Collection Time   02/22/12  7:00 AM      Component Value Range   TSH 1.241  0.350 - 4.500 (uIU/mL)  CARDIAC PANEL(CRET KIN+CKTOT+MB+TROPI)     Status: Normal   Collection Time   02/22/12  7:00 AM      Component Value Range   Total CK 84  7 - 232 (U/L)   CK, MB 3.6  0.3 - 4.0  (ng/mL)   Troponin I <0.30  <0.30 (ng/mL)   Relative Index RELATIVE INDEX IS INVALID  0.0 - 2.5   HEMOGLOBIN A1C     Status: Normal   Collection Time   02/22/12  7:00 AM      Component Value Range   Hemoglobin A1C 5.2  <5.7 (%)   Mean Plasma Glucose 103  <117 (mg/dL)  GLUCOSE, CAPILLARY     Status: Normal   Collection Time   02/22/12  7:44 AM      Component Value Range   Glucose-Capillary 95  70 - 99 (mg/dL)   Comment 1 Notify RN     Comment 2 Documented in Chart    GLUCOSE, CAPILLARY     Status: Abnormal   Collection Time   02/22/12  8:23 AM      Component Value Range   Glucose-Capillary 101 (*) 70 - 99 (mg/dL)   Comment 1 Notify RN     Comment 2 Documented in Chart    MRSA PCR SCREENING     Status: Normal   Collection Time   02/22/12  9:08 AM      Component Value Range   MRSA by PCR NEGATIVE  NEGATIVE   GLUCOSE, CAPILLARY     Status: Normal   Collection Time   02/22/12 11:53 AM      Component Value Range   Glucose-Capillary 95  70 - 99 (mg/dL)  GLUCOSE, CAPILLARY     Status: Abnormal   Collection Time   02/22/12  4:22 PM      Component Value Range   Glucose-Capillary 156 (*) 70 - 99 (mg/dL)   Comment 1 Notify RN     Comment 2 Documented in Chart    BLOOD GAS, ARTERIAL     Status: Abnormal   Collection Time   02/22/12  5:00 PM      Component Value Range   O2 Content 6.0     pH, Arterial 7.266 (*) 7.350 - 7.450    pCO2 arterial 75.2 (*) 35.0 - 45.0 (mmHg)   pO2, Arterial 60.1 (*) 80.0 - 100.0 (mmHg)   Bicarbonate 33.1 (*) 20.0 - 24.0 (mEq/L)   TCO2 29.9  0 - 100 (mmol/L)   Acid-Base Excess 3.6 (*) 0.0 - 2.0 (mmol/L)   O2 Saturation 90.8     Patient temperature 98.6     Collection site LEFT RADIAL     Drawn by COLLECTED BY RT     Sample type ARTERIAL DRAW     Allens test (pass/fail) PASS  PASS   CARDIAC PANEL(CRET KIN+CKTOT+MB+TROPI)     Status: Normal   Collection Time   02/22/12  5:01 PM      Component Value Range   Total CK 80  7 - 232 (U/L)   CK, MB 3.7  0.3 -  4.0 (ng/mL)   Troponin I <0.30  <0.30 (ng/mL)   Relative Index RELATIVE INDEX IS INVALID  0.0 - 2.5   BASIC  METABOLIC PANEL     Status: Abnormal   Collection Time   02/22/12  5:01 PM      Component Value Range   Sodium 136  135 - 145 (mEq/L)   Potassium 3.9  3.5 - 5.1 (mEq/L)   Chloride 93 (*) 96 - 112 (mEq/L)   CO2 37 (*) 19 - 32 (mEq/L)   Glucose, Bld 157 (*) 70 - 99 (mg/dL)   BUN 16  6 - 23 (mg/dL)   Creatinine, Ser 0.98 (*) 0.50 - 1.35 (mg/dL)   Calcium 8.4  8.4 - 11.9 (mg/dL)   GFR calc non Af Amer 43 (*) >90 (mL/min)   GFR calc Af Amer 49 (*) >90 (mL/min)  MAGNESIUM     Status: Normal   Collection Time   02/22/12  5:01 PM      Component Value Range   Magnesium 1.9  1.5 - 2.5 (mg/dL)  BLOOD GAS, ARTERIAL     Status: Abnormal   Collection Time   02/22/12  8:30 PM      Component Value Range   FIO2 0.50     Delivery systems BILEVEL POSITIVE AIRWAY PRESSURE     Inspiratory PAP 12     Expiratory PAP 6     pH, Arterial 7.191 (*) 7.350 - 7.450    pCO2 arterial 94.8 (*) 35.0 - 45.0 (mmHg)   pO2, Arterial 86.1  80.0 - 100.0 (mmHg)   Bicarbonate 34.9 (*) 20.0 - 24.0 (mEq/L)   TCO2 32.2  0 - 100 (mmol/L)   Acid-Base Excess 2.9 (*) 0.0 - 2.0 (mmol/L)   O2 Saturation 96.3     Patient temperature 98.6     Collection site LEFT RADIAL     Drawn by [id number]     Sample type ARTERIAL DRAW     Allens test (pass/fail) PASS  PASS     Imaging  Dg Chest Port 1 View  02/22/2012  *RADIOLOGY REPORT*  Clinical Data: Shortness of breath, chest pain.  PORTABLE CHEST - 1 VIEW  Comparison: 04/09/2010  Findings: Mild cardiomegaly with vascular congestion and diffuse interstitial prominence, likely mild interstitial edema.  No effusions.  No acute bony abnormality.  IMPRESSION: Cardiomegaly.  Diffuse interstitial prominence suggesting interstitial edema.  Original Report Authenticated By: Cyndie Chime, M.D.    Assessment 1. COPD exacerbation 2. CHF exacerbation 3. Type II respiratory  failure   Recommendations: 1. Patient already intubated and on mechanical ventilation. He will be sedated with propofol and fentanyl for analgesia. 2. Cont. To monitor ABG. 3. Cont. Oxygen (goal saturation 90-92%) 4. Cont. Steroids, bronchodilators and antibiotics (would recommend changing moxifloxacin to levofloxacin). 5. Cont. Lasix, I/O monitoring. 6. Echo pending.  The patient is critically ill with multiple organ systems failure and requires high complexity decision making for assessment and support, frequent evaluation and titration of therapies, application of advanced monitoring technologies and extensive interpretation of multiple databases. Critical Care Time devoted to patient care services described in this note is 35 minutes.  Catha Brow, MD 02/22/2012, 9:49 PM

## 2012-02-22 NOTE — ED Notes (Signed)
MD at bedside. 

## 2012-02-22 NOTE — ED Notes (Signed)
Dr. Le, Hospitalist at bedside.  

## 2012-02-22 NOTE — Progress Notes (Addendum)
Patient ID: SAQUAN FURTICK, male   DOB: Nov 13, 1946, 66 y.o.   MRN: [id number]  Came to round on patient and found pt laying on his stomach and responding inappropriately to questions. He is  Somnolent but arousable but oriented only to name.  Family reports that patient had beer and [place] yesterday.   Pt is presently on 6L/minute of Oxygen. He demonstrates some abdominal retractions. Lungs: Decreased air entry but no  Wheezing or rales Abdomen: Pt has abdominal retractions, Normoactive bowel sounds COR: S2, S2 normal. No M/R/G. Ext: No Clubbing/cyanosis/edema  Assessment/Plan: 1. Acute on chronic CHF: Will continue IV Lasix, obtain 2-D Echo. Pt unable to tolerate ACE-I due eto angioedema with ACE-I.   2. Acute exacerbation of COPD/Acute Tracheobronchitis: Continue Avelox, B-agonist and Atrovent, check ABG. Suspect CO2 retention and confusion associated.  3. HTN: Wean NTG off. Continue norvasc, Coreg and Bi-dil.  4. DM II: Continue SSI and Bedtime coverage. Hb A1c   Balian Schaller A.   Just received ABG: 7.2/75.2/60.1/33  Will start IV solumedrol, Albterol and atrovent now and place on BiPAP now. Check BMET and Magnesium. Discussed with Dr. Vassie Loll at [facility].   Total time 1 hour.

## 2012-02-22 NOTE — ED Provider Notes (Addendum)
History     CSN: [id number]  Arrival date & time 02/22/12  0222   First MD Initiated Contact with Patient 02/22/12 0236      Chief Complaint  Patient presents with  . Shortness of Breath    (Consider location/radiation/quality/duration/timing/severity/associated sxs/prior treatment) HPI Comments: Patient was brought in by EMS with shortness of breath which he says started over the last 2 weeks. He states he has a history of CHF in the past and has required intubation in the past. He was placed on CPAP per EMS and was given Lasix and sublingual nitroglycerin in route as well. He complains of some chest pain but only when he is coughing. He denies any leg swelling. He does not weigh himself at home. Denies any recent fevers, nausea, vomiting, and diarrhea, abdominal pain. He states that he is breathing better after being placed on the CPAP  Patient is a 67 y.o. male presenting with shortness of breath. The history is provided by the patient.  Shortness of Breath  Associated symptoms include chest pain, cough and shortness of breath. Pertinent negatives include no fever and no rhinorrhea.    Past Medical History  Diagnosis Date  . CHF (congestive heart failure)   . MI (myocardial infarction)   . COPD (chronic obstructive pulmonary disease)   . Diabetes mellitus   . Hypertension   . Gout   . Hyperlipemia     Past Surgical History  Procedure Date  . Appendectomy     History reviewed. No pertinent family history.  History  Substance Use Topics  . Smoking status: Current Everyday Smoker  . Smokeless tobacco: Not on file  . Alcohol Use: Yes     1/5 or pint per day      Review of Systems  Constitutional: Negative for fever, chills, diaphoresis and fatigue.  HENT: Negative for congestion, rhinorrhea and sneezing.   Eyes: Negative.   Respiratory: Positive for cough and shortness of breath. Negative for chest tightness.   Cardiovascular: Positive for chest pain. Negative  for leg swelling.  Gastrointestinal: Negative for nausea, vomiting, abdominal pain, diarrhea and blood in stool.  Genitourinary: Negative for frequency, hematuria, flank pain and difficulty urinating.  Musculoskeletal: Negative for back pain and arthralgias.  Skin: Negative for rash.  Neurological: Negative for dizziness, speech difficulty, weakness, numbness and headaches.    Allergies  Lisinopril  Home Medications   Current Outpatient Rx  Name Route Sig Dispense Refill  . AMLODIPINE BESYLATE 10 MG PO TABS Oral Take 10 mg by mouth daily.    . ASPIRIN EC 325 MG PO TBEC Oral Take 325 mg by mouth daily.    Marland Kitchen CARVEDILOL 6.25 MG PO TABS Oral Take 6.25 mg by mouth 2 (two) times daily with a meal.    . COLCHICINE 0.6 MG PO TABS Oral Take 0.6 mg by mouth daily as needed. Gout flair ups    . FENOFIBRATE 48 MG PO TABS Oral Take 48 mg by mouth daily.    . FUROSEMIDE 20 MG PO TABS Oral Take 20 mg by mouth daily.    . ISOSORB DINITRATE-HYDRALAZINE 20-37.5 MG PO TABS Oral Take 1 tablet by mouth 3 (three) times daily.    Marland Kitchen KETOPROFEN 50 MG PO CAPS Oral Take 50 mg by mouth 2 (two) times daily.      BP 118/60  Pulse 77  Temp(Src) 98.7 F (37.1 C) (Rectal)  Resp 22  SpO2 99%  Physical Exam  Constitutional: He is oriented to person, place,  and time. He appears well-developed and well-nourished.  HENT:  Head: Normocephalic and atraumatic.  Eyes: Pupils are equal, round, and reactive to light.  Neck: Normal range of motion. Neck supple.  Cardiovascular: Normal rate, regular rhythm and normal heart sounds.   Pulmonary/Chest: He is in respiratory distress. He has no wheezes. He has rales. He exhibits no tenderness.       Patient with increased work of breathing and accessory muscle use. He has decreased breath sounds bilaterally with crackles in the lower lung fields.  Abdominal: Soft. Bowel sounds are normal. There is no tenderness. There is no rebound and no guarding.  Musculoskeletal: Normal  range of motion. He exhibits no edema and no tenderness.  Lymphadenopathy:    He has no cervical adenopathy.  Neurological: He is alert and oriented to person, place, and time.  Skin: Skin is warm. No rash noted. He is diaphoretic.  Psychiatric: He has a normal mood and affect.    ED Course  Procedures (including critical care time)  Results for orders placed during the hospital encounter of 02/22/12  CBC      Component Value Range   WBC 10.8 (*) 4.0 - 10.5 (K/uL)   RBC 4.73  4.22 - 5.81 (MIL/uL)   Hemoglobin 16.1  13.0 - 17.0 (g/dL)   HCT 40.9  81.1 - 91.4 (%)   MCV 105.1 (*) 78.0 - 100.0 (fL)   MCH 34.0  26.0 - 34.0 (pg)   MCHC 32.4  30.0 - 36.0 (g/dL)   RDW 78.2  95.6 - 21.3 (%)   Platelets 180  150 - 400 (K/uL)  BASIC METABOLIC PANEL      Component Value Range   Sodium 133 (*) 135 - 145 (mEq/L)   Potassium 3.8  3.5 - 5.1 (mEq/L)   Chloride 92 (*) 96 - 112 (mEq/L)   CO2 30  19 - 32 (mEq/L)   Glucose, Bld 152 (*) 70 - 99 (mg/dL)   BUN 12  6 - 23 (mg/dL)   Creatinine, Ser 0.86  0.50 - 1.35 (mg/dL)   Calcium 8.9  8.4 - 57.8 (mg/dL)   GFR calc non Af Amer 87 (*) >90 (mL/min)   GFR calc Af Amer >90  >90 (mL/min)  PRO B NATRIURETIC PEPTIDE      Component Value Range   Pro B Natriuretic peptide (BNP) 1675.0 (*) 0 - 125 (pg/mL)  DIFFERENTIAL      Component Value Range   Neutrophils Relative 71  43 - 77 (%)   Neutro Abs 7.7  1.7 - 7.7 (K/uL)   Lymphocytes Relative 14  12 - 46 (%)   Lymphs Abs 1.6  0.7 - 4.0 (K/uL)   Monocytes Relative 13 (*) 3 - 12 (%)   Monocytes Absolute 1.4 (*) 0.1 - 1.0 (K/uL)   Eosinophils Relative 2  0 - 5 (%)   Eosinophils Absolute 0.2  0.0 - 0.7 (K/uL)   Basophils Relative 1  0 - 1 (%)   Basophils Absolute 0.1  0.0 - 0.1 (K/uL)  URINALYSIS, ROUTINE W REFLEX MICROSCOPIC      Component Value Range   Color, Urine YELLOW  YELLOW    APPearance CLEAR  CLEAR    Specific Gravity, Urine 1.011  1.005 - 1.030    pH 5.5  5.0 - 8.0    Glucose, UA NEGATIVE   NEGATIVE (mg/dL)   Hgb urine dipstick NEGATIVE  NEGATIVE    Bilirubin Urine NEGATIVE  NEGATIVE    Ketones, ur NEGATIVE  NEGATIVE (mg/dL)   Protein, ur NEGATIVE  NEGATIVE (mg/dL)   Urobilinogen, UA 1.0  0.0 - 1.0 (mg/dL)   Nitrite NEGATIVE  NEGATIVE    Leukocytes, UA NEGATIVE  NEGATIVE   CARDIAC PANEL(CRET KIN+CKTOT+MB+TROPI)      Component Value Range   Total CK 110  7 - 232 (U/L)   CK, MB 3.8  0.3 - 4.0 (ng/mL)   Troponin I <0.30  <0.30 (ng/mL)   Relative Index 3.5 (*) 0.0 - 2.5   ETHANOL      Component Value Range   Alcohol, Ethyl (B) <11  0 - 11 (mg/dL)  POCT I-STAT TROPONIN I      Component Value Range   Troponin i, poc 0.01  0.00 - 0.08 (ng/mL)   Comment 3            Dg Chest Port 1 View  02/22/2012  *RADIOLOGY REPORT*  Clinical Data: Shortness of breath, chest pain.  PORTABLE CHEST - 1 VIEW  Comparison: 04/09/2010  Findings: Mild cardiomegaly with vascular congestion and diffuse interstitial prominence, likely mild interstitial edema.  No effusions.  No acute bony abnormality.  IMPRESSION: Cardiomegaly.  Diffuse interstitial prominence suggesting interstitial edema.  Original Report Authenticated By: Cyndie Chime, M.D.    Date: 02/22/2012  Rate: 91  Rhythm: normal sinus rhythm  QRS Axis: normal  Intervals: PR prolonged  ST/T Wave abnormalities: nonspecific ST/T changes  Conduction Disutrbances:first-degree A-V block   Narrative Interpretation:   Old EKG Reviewed: none available    Dg Chest Port 1 View  02/22/2012  *RADIOLOGY REPORT*  Clinical Data: Shortness of breath, chest pain.  PORTABLE CHEST - 1 VIEW  Comparison: 04/09/2010  Findings: Mild cardiomegaly with vascular congestion and diffuse interstitial prominence, likely mild interstitial edema.  No effusions.  No acute bony abnormality.  IMPRESSION: Cardiomegaly.  Diffuse interstitial prominence suggesting interstitial edema.  Original Report Authenticated By: Cyndie Chime, M.D.   CRITICAL CARE Performed by:  Daylyn Azbill   Total critical care time: 45 minutes  Critical care time was exclusive of separately billable procedures and treating other patients.  Critical care was necessary to treat or prevent imminent or life-threatening deterioration.  Critical care was time spent personally by me on the following activities: development of treatment plan with patient and/or surrogate as well as nursing, discussions with consultants, evaluation of patient's response to treatment, examination of patient, obtaining history from patient or surrogate, ordering and performing treatments and interventions, ordering and review of laboratory studies, ordering and review of radiographic studies, pulse oximetry and re-evaluation of patient's condition.   1. CHF (congestive heart failure)       MDM  Diagnoses considered include CHF, pneumonia, PE, acute coronary syndrome, COPD exacerbation. Will get chest x-ray and labs cardiac enzymes. On arrival patient was switched over to our CPAP machine which has maintained her respiratory. He was started on nitroglycerin drip and was given aspirin.  0400:  Pt weaned off BiPAP, doing well on nasal cannula, will consult hospitalist for admission  0501: pt dropped sats off BIPAP, started back on, awaiting bed    Rolan Bucco, MD 02/22/12 0502  Rolan Bucco, MD 02/22/12 [id number]

## 2012-02-22 NOTE — ED Notes (Signed)
Attempted to place patient on [place] O2 at this time. Patient desaturated to low 80s on 2-3L [place]; increased to 6L [place] with minimal improvement to mid-high 80s. Patient placed back on BiPaP and initial settings resumed; O2 saturation improved to low 90s. Dr. Fredderick Phenix notified and aware. No new orders at this time. Will continue to monitor.

## 2012-02-22 NOTE — ED Notes (Signed)
WUJ:[id number]<JY> Expected date:02/22/12<BR> Expected time: 2:05 AM<BR> Means of arrival:Ambulance<BR> Comments:<BR> Pulmonary edema

## 2012-02-23 DIAGNOSIS — I517 Cardiomegaly: Secondary | ICD-10-CM

## 2012-02-23 LAB — COMPREHENSIVE METABOLIC PANEL
ALT: 9 U/L (ref 0–53)
AST: 13 U/L (ref 0–37)
Alkaline Phosphatase: 74 U/L (ref 39–117)
CO2: 34 mEq/L — ABNORMAL HIGH (ref 19–32)
Calcium: 8.6 mg/dL (ref 8.4–10.5)
Chloride: 93 mEq/L — ABNORMAL LOW (ref 96–112)
GFR calc Af Amer: 46 mL/min — ABNORMAL LOW (ref >90)
GFR calc non Af Amer: 40 mL/min — ABNORMAL LOW (ref >90)
Glucose, Bld: 133 mg/dL — ABNORMAL HIGH (ref 70–99)
Potassium: 4 mEq/L (ref 3.5–5.1)
Sodium: 135 mEq/L (ref 135–145)

## 2012-02-23 LAB — BLOOD GAS, ARTERIAL
Acid-Base Excess: 7.8 mmol/L — ABNORMAL HIGH (ref 0.0–2.0)
Drawn by: 308601
FIO2: 0.5 %
MECHVT: 510 mL
O2 Saturation: 97.1 %
Patient temperature: 98.6
RATE: 18 resp/min

## 2012-02-23 LAB — GLUCOSE, CAPILLARY
Glucose-Capillary: 105 mg/dL — ABNORMAL HIGH (ref 70–99)
Glucose-Capillary: 89 mg/dL (ref 70–99)

## 2012-02-23 LAB — DIFFERENTIAL
Lymphocytes Relative: 7 % — ABNORMAL LOW (ref 12–46)
Lymphs Abs: 0.4 10*3/uL — ABNORMAL LOW (ref 0.7–4.0)
Monocytes Relative: 4 % (ref 3–12)
Neutro Abs: 5.2 10*3/uL (ref 1.7–7.7)
Neutrophils Relative %: 88 % — ABNORMAL HIGH (ref 43–77)

## 2012-02-23 LAB — CBC
Hemoglobin: 14.6 g/dL (ref 13.0–17.0)
Platelets: 166 10*3/uL (ref 150–400)
RBC: 4.33 MIL/uL (ref 4.22–5.81)
WBC: 5.9 10*3/uL (ref 4.0–10.5)

## 2012-02-23 LAB — MAGNESIUM: Magnesium: 1.8 mg/dL (ref 1.5–2.5)

## 2012-02-23 MED ORDER — THIAMINE HCL 100 MG/ML IJ SOLN
Freq: Once | INTRAVENOUS | Status: AC
  Administered 2012-02-23: 11:00:00 via INTRAVENOUS
  Filled 2012-02-23: qty 1000

## 2012-02-23 MED ORDER — METHYLPREDNISOLONE SODIUM SUCC 125 MG IJ SOLR
80.0000 mg | Freq: Three times a day (TID) | INTRAMUSCULAR | Status: DC
  Administered 2012-02-23 – 2012-02-24 (×3): 80 mg via INTRAVENOUS
  Filled 2012-02-23 (×2): qty 2
  Filled 2012-02-23: qty 1.28
  Filled 2012-02-23: qty 2
  Filled 2012-02-23 (×2): qty 1.28

## 2012-02-23 MED ORDER — FUROSEMIDE 10 MG/ML IJ SOLN
40.0000 mg | Freq: Two times a day (BID) | INTRAMUSCULAR | Status: DC
  Administered 2012-02-23 – 2012-02-26 (×6): 40 mg via INTRAVENOUS
  Filled 2012-02-23 (×8): qty 4

## 2012-02-23 MED ORDER — PANTOPRAZOLE SODIUM 40 MG PO PACK
40.0000 mg | PACK | Freq: Every day | ORAL | Status: DC
  Administered 2012-02-23 – 2012-02-24 (×2): 40 mg
  Filled 2012-02-23 (×2): qty 20

## 2012-02-23 MED ORDER — INFLUENZA VIRUS VACC SPLIT PF IM SUSP
0.5000 mL | Freq: Once | INTRAMUSCULAR | Status: DC
  Filled 2012-02-23: qty 0.5

## 2012-02-23 MED ORDER — MIDAZOLAM HCL 5 MG/ML IJ SOLN
1.0000 mg | Freq: Three times a day (TID) | INTRAMUSCULAR | Status: DC
  Administered 2012-02-23: 1 mg via INTRAVENOUS
  Filled 2012-02-23: qty 1

## 2012-02-23 MED ORDER — DEXTROSE 5 % IV SOLN: 1.0000 g | Freq: Three times a day (TID) | INTRAVENOUS | Status: DC

## 2012-02-23 MED ORDER — INSULIN ASPART 100 UNIT/ML ~~LOC~~ SOLN
0.0000 [IU] | SUBCUTANEOUS | Status: DC
  Administered 2012-02-23 – 2012-02-24 (×4): 2 [IU] via SUBCUTANEOUS
  Administered 2012-02-24: 8 [IU] via SUBCUTANEOUS
  Administered 2012-02-24: 2 [IU] via SUBCUTANEOUS
  Administered 2012-02-25: 3 [IU] via SUBCUTANEOUS
  Filled 2012-02-23: qty 3

## 2012-02-23 MED ORDER — VANCOMYCIN HCL IN DEXTROSE 1-5 GM/200ML-% IV SOLN: 1000.0000 mg | Freq: Two times a day (BID) | INTRAVENOUS | Status: DC

## 2012-02-23 MED ORDER — PNEUMOCOCCAL VAC POLYVALENT 25 MCG/0.5ML IJ INJ
0.5000 mL | INJECTION | Freq: Once | INTRAMUSCULAR | Status: AC
  Administered 2012-02-23: 0.5 mL via INTRAMUSCULAR
  Filled 2012-02-23: qty 0.5

## 2012-02-23 MED ORDER — DEXTROSE 5 % IV SOLN
2.0000 g | Freq: Two times a day (BID) | INTRAVENOUS | Status: DC
  Administered 2012-02-23 – 2012-02-24 (×3): 2 g via INTRAVENOUS
  Filled 2012-02-23 (×5): qty 2

## 2012-02-23 MED ORDER — VANCOMYCIN HCL 1000 MG IV SOLR
750.0000 mg | Freq: Two times a day (BID) | INTRAVENOUS | Status: DC
  Administered 2012-02-23 – 2012-02-24 (×2): 750 mg via INTRAVENOUS
  Filled 2012-02-23 (×3): qty 750

## 2012-02-23 NOTE — Consult Note (Signed)
Name: John [name] MRN: [id number] DOB: 03/26/1946  DOS:   02/22/12    CRITICAL CARE MEDICINE ADMISSION / CONSULTATION NOTE  Referring Physician: Dr Ashley [name]  Reason For Consult : Hypercapnic respiratory failure  The patient is sedated, intubated and unable to provide history, which was obtained for available medical records.    History Of Present Illness : The patient is a 65/M with multiple medical problems including COPD and CHF who was admitted to for COPD and CHF exacerbation. He was being treated for both the conditions. He was placed on BIPAP due to hypercapnic respiratory failure but continued to get acidotic and somnolent and hence we intubated him for Type II respiratory failure.  Physical Examination  BP 127/57  Pulse 58  Temp(Src) 99.7 F (37.6 C) (Oral)  Resp 18  Ht 5\' 6"  (1.676 m)  Wt 87.2 kg (192 lb 3.9 oz)  BMI 31.03 kg/m2  SpO2 93%  Neuro:  Sedated, intubated, rass -2 HEENT:  ETT/OGT Heart:  RRR, no M/R/G Lungs:  Bilateral ronchi, reduced Abdomen:  Soft, non tender, bowel sounds present Extremities: No cyanosis, clubbing or edema.  Labs  Results for orders placed during the hospital encounter of 02/22/12 (from the past 24 hour(s))  GLUCOSE, CAPILLARY     Status: Normal   Collection Time   02/22/12 11:53 AM      Component Value Range   Glucose-Capillary 95  70 - 99 (mg/dL)  GLUCOSE, CAPILLARY     Status: Abnormal   Collection Time   02/22/12  4:22 PM      Component Value Range   Glucose-Capillary 156 (*) 70 - 99 (mg/dL)   Comment 1 Notify RN     Comment 2 Documented in Chart    BLOOD GAS, ARTERIAL     Status: Abnormal   Collection Time   02/22/12  5:00 PM      Component Value Range   O2 Content 6.0     pH, Arterial 7.266 (*) 7.350 - 7.450    pCO2 arterial 75.2 (*) 35.0 - 45.0 (mmHg)   pO2, Arterial 60.1 (*) 80.0 - 100.0 (mmHg)   Bicarbonate 33.1 (*) 20.0 - 24.0 (mEq/L)   TCO2 29.9  0 - 100 (mmol/L)   Acid-Base Excess 3.6 (*) 0.0 - 2.0  (mmol/L)   O2 Saturation 90.8     Patient temperature 98.6     Collection site LEFT RADIAL     Drawn by COLLECTED BY RT     Sample type ARTERIAL DRAW     Allens test (pass/fail) PASS  PASS   CARDIAC PANEL(CRET KIN+CKTOT+MB+TROPI)     Status: Normal   Collection Time   02/22/12  5:01 PM      Component Value Range   Total CK 80  7 - 232 (U/L)   CK, MB 3.7  0.3 - 4.0 (ng/mL)   Troponin I <0.30  <0.30 (ng/mL)   Relative Index RELATIVE INDEX IS INVALID  0.0 - 2.5   BASIC METABOLIC PANEL     Status: Abnormal   Collection Time   02/22/12  5:01 PM      Component Value Range   Sodium 136  135 - 145 (mEq/L)   Potassium 3.9  3.5 - 5.1 (mEq/L)   Chloride 93 (*) 96 - 112 (mEq/L)   CO2 37 (*) 19 - 32 (mEq/L)   Glucose, Bld 157 (*) 70 - 99 (mg/dL)   BUN 16  6 - 23 (mg/dL)   Creatinine, Ser 5.62 (*) 0.50 -  1.35 (mg/dL)   Calcium 8.4  8.4 - 16.1 (mg/dL)   GFR calc non Af Amer 43 (*) >90 (mL/min)   GFR calc Af Amer 49 (*) >90 (mL/min)  MAGNESIUM     Status: Normal   Collection Time   02/22/12  5:01 PM      Component Value Range   Magnesium 1.9  1.5 - 2.5 (mg/dL)  BLOOD GAS, ARTERIAL     Status: Abnormal   Collection Time   02/22/12  8:30 PM      Component Value Range   FIO2 0.50     Delivery systems BILEVEL POSITIVE AIRWAY PRESSURE     Inspiratory PAP 12     Expiratory PAP 6     pH, Arterial 7.191 (*) 7.350 - 7.450    pCO2 arterial 94.8 (*) 35.0 - 45.0 (mmHg)   pO2, Arterial 86.1  80.0 - 100.0 (mmHg)   Bicarbonate 34.9 (*) 20.0 - 24.0 (mEq/L)   TCO2 32.2  0 - 100 (mmol/L)   Acid-Base Excess 2.9 (*) 0.0 - 2.0 (mmol/L)   O2 Saturation 96.3     Patient temperature 98.6     Collection site LEFT RADIAL     Drawn by [id number]     Sample type ARTERIAL DRAW     Allens test (pass/fail) PASS  PASS   GLUCOSE, CAPILLARY     Status: Abnormal   Collection Time   02/22/12 10:22 PM      Component Value Range   Glucose-Capillary 150 (*) 70 - 99 (mg/dL)   Comment 1 Documented in Chart     Comment 2  Notify RN    BLOOD GAS, ARTERIAL     Status: Abnormal   Collection Time   02/22/12 10:38 PM      Component Value Range   FIO2 0.50     Delivery systems VENTILATOR     Mode PRESSURE REGULATED VOLUME CONTROL     VT 510     Rate 18     Peep/cpap 5.0     pH, Arterial 7.231 (*) 7.350 - 7.450    pCO2 arterial 85.3 (*) 35.0 - 45.0 (mmHg)   pO2, Arterial 66.5 (*) 80.0 - 100.0 (mmHg)   Bicarbonate 34.5 (*) 20.0 - 24.0 (mEq/L)   TCO2 31.4  0 - 100 (mmol/L)   Acid-Base Excess 3.8 (*) 0.0 - 2.0 (mmol/L)   O2 Saturation 93.4     Patient temperature 98.6     Collection site RIGHT RADIAL     Drawn by [id number]     Sample type ARTERIAL DRAW     Allens test (pass/fail) PASS  PASS   GLUCOSE, CAPILLARY     Status: Abnormal   Collection Time   02/23/12 12:19 AM      Component Value Range   Glucose-Capillary 138 (*) 70 - 99 (mg/dL)   Comment 1 Documented in Chart     Comment 2 Notify RN    CARDIAC PANEL(CRET KIN+CKTOT+MB+TROPI)     Status: Normal   Collection Time   02/23/12  1:20 AM      Component Value Range   Total CK 69  7 - 232 (U/L)   CK, MB 2.9  0.3 - 4.0 (ng/mL)   Troponin I <0.30  <0.30 (ng/mL)   Relative Index RELATIVE INDEX IS INVALID  0.0 - 2.5   COMPREHENSIVE METABOLIC PANEL     Status: Abnormal   Collection Time   02/23/12  1:20 AM      Component  Value Range   Sodium 135  135 - 145 (mEq/L)   Potassium 4.0  3.5 - 5.1 (mEq/L)   Chloride 93 (*) 96 - 112 (mEq/L)   CO2 34 (*) 19 - 32 (mEq/L)   Glucose, Bld 133 (*) 70 - 99 (mg/dL)   BUN 21  6 - 23 (mg/dL)   Creatinine, Ser 1.61 (*) 0.50 - 1.35 (mg/dL)   Calcium 8.6  8.4 - 09.6 (mg/dL)   Total Protein 7.4  6.0 - 8.3 (g/dL)   Albumin 3.1 (*) 3.5 - 5.2 (g/dL)   AST 13  0 - 37 (U/L)   ALT 9  0 - 53 (U/L)   Alkaline Phosphatase 74  39 - 117 (U/L)   Total Bilirubin 0.5  0.3 - 1.2 (mg/dL)   GFR calc non Af Amer 40 (*) >90 (mL/min)   GFR calc Af Amer 46 (*) >90 (mL/min)  CBC     Status: Abnormal   Collection Time   02/23/12  1:20 AM       Component Value Range   WBC 5.9  4.0 - 10.5 (K/uL)   RBC 4.33  4.22 - 5.81 (MIL/uL)   Hemoglobin 14.6  13.0 - 17.0 (g/dL)   HCT 04.5  40.9 - 81.1 (%)   MCV 107.4 (*) 78.0 - 100.0 (fL)   MCH 33.7  26.0 - 34.0 (pg)   MCHC 31.4  30.0 - 36.0 (g/dL)   RDW 91.4  78.2 - 95.6 (%)   Platelets 166  150 - 400 (K/uL)  DIFFERENTIAL     Status: Abnormal   Collection Time   02/23/12  1:20 AM      Component Value Range   Neutrophils Relative 88 (*) 43 - 77 (%)   Neutro Abs 5.2  1.7 - 7.7 (K/uL)   Lymphocytes Relative 7 (*) 12 - 46 (%)   Lymphs Abs 0.4 (*) 0.7 - 4.0 (K/uL)   Monocytes Relative 4  3 - 12 (%)   Monocytes Absolute 0.3  0.1 - 1.0 (K/uL)   Eosinophils Relative 0  0 - 5 (%)   Eosinophils Absolute 0.0  0.0 - 0.7 (K/uL)   Basophils Relative 1  0 - 1 (%)   Basophils Absolute 0.0  0.0 - 0.1 (K/uL)  MAGNESIUM     Status: Normal   Collection Time   02/23/12  1:20 AM      Component Value Range   Magnesium 1.8  1.5 - 2.5 (mg/dL)  GLUCOSE, CAPILLARY     Status: Abnormal   Collection Time   02/23/12  4:07 AM      Component Value Range   Glucose-Capillary 128 (*) 70 - 99 (mg/dL)   Comment 1 Documented in Chart     Comment 2 Notify RN    BLOOD GAS, ARTERIAL     Status: Abnormal   Collection Time   02/23/12  5:12 AM      Component Value Range   FIO2 0.50     Delivery systems VENTILATOR     Mode PRESSURE REGULATED VOLUME CONTROL     VT 510     Rate 18     Peep/cpap 5.0     pH, Arterial 7.420  7.350 - 7.450    pCO2 arterial 52.7 (*) 35.0 - 45.0 (mmHg)   pO2, Arterial 74.2 (*) 80.0 - 100.0 (mmHg)   Bicarbonate 33.6 (*) 20.0 - 24.0 (mEq/L)   TCO2 29.3  0 - 100 (mmol/L)   Acid-Base Excess 7.8 (*) 0.0 -  2.0 (mmol/L)   O2 Saturation 97.1     Patient temperature 98.6     Collection site RIGHT RADIAL     Drawn by [id number]     Sample type ARTERIAL DRAW     Allens test (pass/fail) PASS  PASS   GLUCOSE, CAPILLARY     Status: Abnormal   Collection Time   02/23/12  7:56 AM      Component  Value Range   Glucose-Capillary 105 (*) 70 - 99 (mg/dL)   Comment 1 Notify RN     Comment 2 Documented in Chart      Imaging  Dg Chest 1 View  02/22/2012  *RADIOLOGY REPORT*  Clinical Data: Status post intubation.  CHEST - 1 VIEW  Comparison: 02/22/2012  Findings: Endotracheal tube is 4 cm above the carina.  NG tube is seen entering the stomach.  There is cardiomegaly.  Diffuse bilateral interstitial and alveolar opacities have worsened since prior study, presumably worsening edema.  No acute bony abnormality.  No definite effusions.  IMPRESSION: Worsening bilateral opacities, likely edema.  Endotracheal tube 4 cm above the carina.  Original Report Authenticated By: Cyndie Chime, M.D.   Dg Chest Port 1 View  02/22/2012  *RADIOLOGY REPORT*  Clinical Data: Shortness of breath, chest pain.  PORTABLE CHEST - 1 VIEW  Comparison: 04/09/2010  Findings: Mild cardiomegaly with vascular congestion and diffuse interstitial prominence, likely mild interstitial edema.  No effusions.  No acute bony abnormality.  IMPRESSION: Cardiomegaly.  Diffuse interstitial prominence suggesting interstitial edema.  Original Report Authenticated By: Cyndie Chime, M.D.    Assessment 1. COPD exacerbation / Acute resp failure -abg reviewed, rate to 14 -SBT planned, no extubation planned -re add maintenance steroids Continue BD'ers Lasix to neg balance as goal - even  Secretions increased, may explain int markings on pcxr Echo to follow  2. CHF exacerbation -trop neg Beta blocker tolerated thus far Lasix to q12h -echo  On order  3. R/o PNA Continue levofloxacin Add vanc, secretions increasing, intubated, worsening clinical status Add ceftaz Sputum sent Ensure BC sent  4. Macrocytosis etoh in history Ensure thiamine, folic, MVI WD prevention until history clarified  5. agitation Propofol Add versed low dose   The patient is critically ill with multiple organ systems failure and requires high  complexity decision making for assessment and support, frequent evaluation and titration of therapies, application of advanced monitoring technologies and extensive interpretation of multiple databases. Critical Care Time devoted to patient care services described in this note is 35 minutes.  Nelda Bucks., MD 02/23/2012, 9:55 AM   Mcarthur Rossetti. Tyson Alias, MD, FACP Pgr: [phone] [facility]

## 2012-02-23 NOTE — Progress Notes (Signed)
eLink Physician-Brief Progress Note Patient Name: [name] DOB: 1946/01/21 MRN: [id number]  Date of Service  02/23/2012   HPI/Events of Note  Patient now intubated.   DM on SSI AC/HS - now NPO  eICU Interventions  Plan: Change to q4 hour SSI moderate scale   Intervention Category Intermediate Interventions: Hyperglycemia - evaluation and treatment  Falon Flinchum 02/23/2012, 1:52 AM

## 2012-02-23 NOTE — Progress Notes (Signed)
INITIAL ADULT NUTRITION ASSESSMENT Date: 02/23/2012   Time: 1:56 PM  Reason for Assessment: VDRF   ASSESSMENT: Male 66 y.o.  Dx: COPD exacerbation  Hx:  Past Medical History  Diagnosis Date  . CHF (congestive heart failure)   . MI (myocardial infarction)   . COPD (chronic obstructive pulmonary disease)   . Diabetes mellitus   . Hypertension   . Gout   . Hyperlipemia     Related Meds:     . albuterol  2.5 mg Nebulization Q6H  . albuterol  5 mg Nebulization NOW  . antiseptic oral rinse  15 mL Mouth Rinse QID  . aspirin  300 mg Rectal Daily  . cefTAZidime (FORTAZ)  IV  2 g Intravenous Q12H  . chlorhexidine  15 mL Mouth Rinse BID  . cloNIDine  0.1 mg Transdermal Weekly  . enoxaparin  40 mg Subcutaneous Q24H  . fenofibrate  54 mg Oral Daily  . furosemide  40 mg Intravenous BID  . influenza  inactive virus vaccine  0.5 mL Intramuscular Once  . insulin aspart  0-15 Units Subcutaneous Q4H  . ipratropium  0.5 mg Nebulization NOW  . ipratropium  0.5 mg Nebulization Q6H  . LORazepam  0.5 mg Oral Once  . LORazepam      . methylPREDNISolone (SOLU-MEDROL) injection  125 mg Intravenous NOW  . methylPREDNISolone (SOLU-MEDROL) injection  80 mg Intravenous Q8H  . metoprolol  5 mg Intravenous Q6H  . midazolam  1 mg Intravenous Q8H  . moxifloxacin  400 mg Intravenous Q24H  . pneumococcal 23 valent vaccine  0.5 mL Intramuscular Once  . banana bag IV fluid 1000 mL   Intravenous Once  . sodium chloride  3 mL Intravenous Q12H  . sodium chloride  3 mL Intravenous Q12H  . vancomycin  750 mg Intravenous Q12H  . DISCONTD: amLODipine  10 mg Oral Daily  . DISCONTD: aspirin EC  325 mg Oral Daily  . DISCONTD: carvedilol  6.25 mg Oral BID WC  . DISCONTD: cefTAZidime (FORTAZ)  IV  1 g Intravenous Q8H  . DISCONTD: furosemide  40 mg Intravenous BID WC  . DISCONTD: insulin aspart  0-15 Units Subcutaneous TID WC  . DISCONTD: insulin aspart  0-5 Units Subcutaneous QHS  . DISCONTD: insulin aspart   4 Units Subcutaneous TID WC  . DISCONTD: isosorbide-hydrALAZINE  1 tablet Oral TID  . DISCONTD: vancomycin  1,000 mg Intravenous Q12H     Ht: 5\' 6"  (167.6 cm)  Wt: 192 lb 3.9 oz (87.2 kg)  Ideal Wt: 64.5 kg % Ideal Wt: 135%  Usual Wt: Unknown % Usual Wt: Unknown  Body mass index is 31.03 kg/(m^2).  Food/Nutrition Related Hx: Unable to obtain history from patient. Patient is intubated and sedated on propofol at 15.7 ml/hr, which provides 414 kcal from fat. No plans for extubation at this time.   Labs:  CMP     Component Value Date/Time   NA 135 02/23/2012 0120   K 4.0 02/23/2012 0120   CL 93* 02/23/2012 0120   CO2 34* 02/23/2012 0120   GLUCOSE 133* 02/23/2012 0120   BUN 21 02/23/2012 0120   CREATININE 1.71* 02/23/2012 0120   CALCIUM 8.6 02/23/2012 0120   PROT 7.4 02/23/2012 0120   ALBUMIN 3.1* 02/23/2012 0120   AST 13 02/23/2012 0120   ALT 9 02/23/2012 0120   ALKPHOS 74 02/23/2012 0120   BILITOT 0.5 02/23/2012 0120   GFRNONAA 40* 02/23/2012 0120   GFRAA 46* 02/23/2012 0120   CBG (  last 3)   Basename 02/23/12 1119 02/23/12 0756 02/23/12 0407  GLUCAP 89 105* 128*    Intake/Output Summary (Last 24 hours) at 02/23/12 1358 Last data filed at 02/23/12 1203  Gross per 24 hour  Intake 1410.2 ml  Output   1510 ml  Net  -99.8 ml    Diet Order:  NPO  Supplements/Tube Feeding: None  IVF:    fentaNYL infusion INTRAVENOUS Last Rate: 100 mcg/hr (02/23/12 0600)  propofol Last Rate: 30 mcg/kg/min (02/23/12 0931)    Estimated Nutritional Needs:   Kcal: 2130-8657 (60-70% estimated needs) Protein: 87-105 Fluid: 2.6 L  NUTRITION DIAGNOSIS: -Inadequate oral intake (NI-2.1).  Status: Ongoing  RELATED TO: VDRF  AS EVIDENCE BY: NPO status  MONITORING/EVALUATION(Goals): Enteral nutrition to provide 60-70% of estimated calorie needs (22-25 kcals/kg ideal body weight) and 100% of estimated protein needs, based on ASPEN guidelines for permissive underfeeding in critically ill obese  individuals.  Monitor: TF initiation, labs  EDUCATION NEEDS: -No education needs identified at this time  INTERVENTION: 1. If unable to extubate patient within the next 24-48 hours, recommend initiate Promote at goal rate of 25 ml/hr with ProStat 30 ml TID. Total enteral nutrition plus propofol will provide 1230 kcal, 83 g protein, 503 ml free water.  2. If propofol d/c, initiate Promote at 25 ml/hr, increase by 10 ml/hr every 4 hours to goal rate of 45 ml/hr with ProStat 30 ml daily (1224 kcal, 98 g protein, 906 ml free water.   DOCUMENTATION CODES Per approved criteria  -Obesity Unspecified    John [name] 02/23/2012, 1:56 PM

## 2012-02-23 NOTE — Progress Notes (Signed)
ANTIBIOTIC CONSULT NOTE - INITIAL  Pharmacy Consult for Vanc/Ceftazidime Indication: AECOPD/PNA  Allergies  Allergen Reactions  . Lisinopril Swelling    Patient Measurements: Height: 5\' 6"  (167.6 cm) Weight: 192 lb 3.9 oz (87.2 kg) IBW/kg (Calculated) : 63.8   Vital Signs: Temp: 99.7 F (37.6 C) (02/24 0800) Temp src: Oral (02/24 0800) BP: 127/57 mmHg (02/24 0800) Pulse Rate: 58  (02/24 0800) Intake/Output from previous day: 02/23 0701 - 02/24 0700 In: 1328.5 [P.O.:240; I.V.:588.5; IV Piggyback:500] Out: 390 [Urine:390] Intake/Output from this shift: Total I/O In: 61.4 [I.V.:61.4] Out: 120 [Urine:120]  Labs:  Basename 02/23/12 0120 02/22/12 1701 02/22/12 0230  WBC 5.9 -- 10.8*  HGB 14.6 -- 16.1  PLT 166 -- 180  LABCREA -- -- --  CREATININE 1.71* 1.61* 0.87   Estimated Creatinine Clearance: 43.4 ml/min (by C-G formula based on Cr of 1.71).    Microbiology: Recent Results (from the past 720 hour(s))  MRSA PCR SCREENING     Status: Normal   Collection Time   02/22/12  9:08 AM      Component Value Range Status Comment   MRSA by PCR NEGATIVE  NEGATIVE  Final     Medical History: Past Medical History  Diagnosis Date  . CHF (congestive heart failure)   . MI (myocardial infarction)   . COPD (chronic obstructive pulmonary disease)   . Diabetes mellitus   . Hypertension   . Gout   . Hyperlipemia     Medications:  Scheduled:    . albuterol  2.5 mg Nebulization Q6H  . albuterol  5 mg Nebulization NOW  . albuterol      . antiseptic oral rinse  15 mL Mouth Rinse QID  . aspirin  300 mg Rectal Daily  . cefTAZidime (FORTAZ)  IV  2 g Intravenous Q12H  . chlorhexidine  15 mL Mouth Rinse BID  . cloNIDine  0.1 mg Transdermal Weekly  . enoxaparin  40 mg Subcutaneous Q24H  . fenofibrate  54 mg Oral Daily  . furosemide  40 mg Intravenous BID  . influenza  inactive virus vaccine  0.5 mL Intramuscular Once  . insulin aspart  0-15 Units Subcutaneous Q4H  .  ipratropium  0.5 mg Nebulization NOW  . ipratropium  0.5 mg Nebulization Q6H  . LORazepam  0.5 mg Oral Once  . LORazepam      . methylPREDNISolone (SOLU-MEDROL) injection  125 mg Intravenous NOW  . methylPREDNISolone (SOLU-MEDROL) injection  80 mg Intravenous Q8H  . metoprolol  5 mg Intravenous Q6H  . midazolam  1 mg Intravenous Q8H  . moxifloxacin  400 mg Intravenous Q24H  . pneumococcal 23 valent vaccine  0.5 mL Intramuscular Once  . banana bag IV fluid 1000 mL   Intravenous Once  . sodium chloride  3 mL Intravenous Q12H  . sodium chloride  3 mL Intravenous Q12H  . vancomycin  750 mg Intravenous Q12H  . DISCONTD: amLODipine  10 mg Oral Daily  . DISCONTD: aspirin EC  325 mg Oral Daily  . DISCONTD: carvedilol  6.25 mg Oral BID WC  . DISCONTD: cefTAZidime (FORTAZ)  IV  1 g Intravenous Q8H  . DISCONTD: furosemide  40 mg Intravenous BID WC  . DISCONTD: insulin aspart  0-15 Units Subcutaneous TID WC  . DISCONTD: insulin aspart  0-5 Units Subcutaneous QHS  . DISCONTD: insulin aspart  4 Units Subcutaneous TID WC  . DISCONTD: isosorbide-hydrALAZINE  1 tablet Oral TID  . DISCONTD: vancomycin  1,000 mg Intravenous Q12H  Infusions:    . fentaNYL infusion INTRAVENOUS 100 mcg/hr (02/23/12 0600)  . propofol 30 mcg/kg/min (02/23/12 0931)   PRN: sodium chloride, albuterol, fentaNYL, sodium chloride, DISCONTD: morphine Assessment:  65/M with hx COPD and CHF admitted 2/23 for AECOPD and CHF exacerbation  Started on Avelox 2/23.   Intubated 2/23 d/t acute respiratory failure  Broaden ABX coverage today by adding Vanc and Fortaz to Avelox d/t increased secretions/PNA  Goal of Therapy:  Vancomycin trough level 15-20 mcg/ml Renal dose of Fortaz  Plan:   Vancomycin 750mg  IV q12h  Cefepime 2g IV q12h  Follow SCr, vitals, cultures  Vanc tr @ Css or random level sooner if warranted  Adjust doses as necessary  Gwen Her PharmD  [phone] 02/23/2012 10:30 AM

## 2012-02-23 NOTE — Progress Notes (Signed)
  Echocardiogram 2D Echocardiogram has been performed.  Rayetta Humphrey 02/23/2012, 10:18 AM

## 2012-02-23 NOTE — Progress Notes (Signed)
eLink Physician-Brief Progress Note Patient Name: John [name] DOB: [birth year] MRN: [id number]  Date of Service  02/23/2012   HPI/Events of Note     eICU Interventions  AM labs ordered   Intervention Category Minor Interventions: Routine modifications to care plan (e.g. PRN medications for pain, fever)  John [name] 02/23/2012, 4:04 AM

## 2012-02-24 ENCOUNTER — Inpatient Hospital Stay (HOSPITAL_COMMUNITY): Payer: Medicare Other

## 2012-02-24 LAB — GLUCOSE, CAPILLARY
Glucose-Capillary: 129 mg/dL — ABNORMAL HIGH (ref 70–99)
Glucose-Capillary: 126 mg/dL — ABNORMAL HIGH (ref 70–99)
Glucose-Capillary: 119 mg/dL — ABNORMAL HIGH (ref 70–99)

## 2012-02-24 LAB — CBC
HCT: 46.8 % (ref 39.0–52.0)
MCHC: 32.1 g/dL (ref 30.0–36.0)
MCV: 104.2 fL — ABNORMAL HIGH (ref 78.0–100.0)
Platelets: 229 10*3/uL (ref 150–400)
RDW: 14.6 % (ref 11.5–15.5)
WBC: 7.9 10*3/uL (ref 4.0–10.5)

## 2012-02-24 LAB — DIFFERENTIAL
Basophils Absolute: 0 10*3/uL (ref 0.0–0.1)
Eosinophils Absolute: 0 10*3/uL (ref 0.0–0.7)
Lymphocytes Relative: 5 % — ABNORMAL LOW (ref 12–46)
Lymphs Abs: 0.4 10*3/uL — ABNORMAL LOW (ref 0.7–4.0)
Monocytes Relative: 4 % (ref 3–12)
Neutro Abs: 7.2 10*3/uL (ref 1.7–7.7)

## 2012-02-24 LAB — BASIC METABOLIC PANEL
BUN: 21 mg/dL (ref 6–23)
Calcium: 8.7 mg/dL (ref 8.4–10.5)
Chloride: 97 mEq/L (ref 96–112)
Creatinine, Ser: 1.13 mg/dL (ref 0.50–1.35)
GFR calc Af Amer: 76 mL/min — ABNORMAL LOW (ref >90)

## 2012-02-24 MED ORDER — DEXTROSE 5 % IV SOLN
2.0000 g | Freq: Three times a day (TID) | INTRAVENOUS | Status: DC
  Filled 2012-02-24 (×3): qty 2

## 2012-02-24 MED ORDER — METHYLPREDNISOLONE SODIUM SUCC 40 MG IJ SOLR
40.0000 mg | Freq: Three times a day (TID) | INTRAMUSCULAR | Status: DC
  Administered 2012-02-24 – 2012-02-25 (×3): 40 mg via INTRAVENOUS
  Filled 2012-02-24 (×4): qty 1

## 2012-02-24 MED ORDER — VANCOMYCIN HCL IN DEXTROSE 1-5 GM/200ML-% IV SOLN
1000.0000 mg | Freq: Two times a day (BID) | INTRAVENOUS | Status: DC
  Administered 2012-02-24: 1000 mg via INTRAVENOUS
  Filled 2012-02-24 (×2): qty 200

## 2012-02-24 MED ORDER — BIOTENE DRY MOUTH MT LIQD
15.0000 mL | Freq: Two times a day (BID) | OROMUCOSAL | Status: DC
  Administered 2012-02-24 – 2012-02-27 (×6): 15 mL via OROMUCOSAL

## 2012-02-24 MED ORDER — PANTOPRAZOLE SODIUM 40 MG PO TBEC
40.0000 mg | DELAYED_RELEASE_TABLET | Freq: Two times a day (BID) | ORAL | Status: DC
  Administered 2012-02-24 – 2012-02-27 (×6): 40 mg via ORAL
  Filled 2012-02-24 (×7): qty 1

## 2012-02-24 NOTE — Consult Note (Signed)
Name: John [name] MRN: [id number] DOB: 05-01-1946  DOS:   02/22/12    [facility]  NOTE  Referring Physician: Dr Ashley [name]  Reason For Consult : Hypercapnic respiratory failure   Brief patient profile:  65/M with multiple medical problems including COPD and CHF who was admitted on 2/23 for COPD and CHF exacerbation. He was being treated for both the conditions. He failed Attempts at NIPPV and therefore was intubated on 2/23  Lines/tubes OETT 2/23>>>2/25  Micro Sputum 2/23: mod gm variable rods/mod grm + cocci pairs, mod WBC>>> bcx2 2/24>>>  Antibiotics avelox (aecopd)2/23>>> ceftaz 2/24>>> 2/25 (no indication) vanc 2/24>>> 2/25 (no indication)  Subjective Passed SBT.Marland Kitchen No distress> extubated  Physical Examination Temp:  [98.5 F (36.9 C)-99.7 F (37.6 C)] 98.5 F (36.9 C) (02/25 0800) Pulse Rate:  [51-75] 75  (02/25 1100) Resp:  [14-19] 19  (02/25 1100) BP: (124-177)/(50-81) 168/62 mmHg (02/25 1000) SpO2:  [92 %-100 %] 95 % (02/25 1100) FiO2 (%):  [40 %] 40 % (02/25 0828) Weight:  [86.8 kg (191 lb 5.8 oz)] 86.8 kg (191 lb 5.8 oz) (02/25 0100)      . fentaNYL infusion INTRAVENOUS 100 mcg/hr (02/24/12 1100)  . propofol 15 mcg/kg/min (02/24/12 1000)    Intake/Output Summary (Last 24 hours) at 02/24/12 1152 Last data filed at 02/24/12 1100  Gross per 24 hour  Intake 1977.9 ml  Output   4530 ml  Net -2552.1 ml   PEx Neuro:  awake HEENT:  oropharnyx clear Heart:  RRR, no M/R/G Lungs:  Bilateral ronchi, no accessory muscle use Abdomen:  Soft, non tender, bowel sounds present Extremities: No cyanosis, clubbing or edema. neuro: follows command   Lab 02/24/12 0306 02/23/12 0120 02/22/12 1701  NA 141 135 136  K 3.8 4.0 3.9  CL 97 93* 93*  CO2 38* 34* 37*  BUN 21 21 16   CREATININE 1.13 1.71* 1.61*  GLUCOSE 127* 133* 157*    Lab 02/24/12 0306 02/23/12 0120 02/22/12 0230  HGB 15.0 14.6 16.1  HCT 46.8 46.5 49.7  WBC 7.9 5.9 10.8*  PLT 229 166 180    pcxr 2/25 Stable support apparatus.  2. Cardiomegaly with increased basilar atelectasis.  3. Central venous congestion versus mild pulmonary edema   Assessment Acute respiratory failure in setting of Acute exacerbation of COPD w/ purulent bronchitis vs CAP, complicated by decompensated Heart failure w/ Pulmonary edema: Has responded well to diuresis, empiric antibiotics and systemic steroids. Extubated 2/25 after passing SBT.  Plan: -cont lasix for neg fluid balance goal -Continue BD'ers -cont avelox for aecopd as per dashboard -wean steroids  CHF exacerbation/decompensated heart failure  Lab 02/23/12 0120 02/22/12 1701 02/22/12 0700  CKTOTAL 69 80 84  CKMB 2.9 3.7 3.6  TROPONINI <0.30 <0.30 <0.30  Echo 2/24 Left ventricle: The cavity size was normal. Wall thickness was increased in a pattern of mild LVH. Systolic function was normal. The estimated ejection fraction was in the range of 50% to 55%. Wall motion was normal; there were no regional wall motion abnormalities. The study is not technically sufficient to allow evaluation of LV diastolic function. - Left atrium: The atrium was mildly dilated. - Right ventricle: The cavity size was moderately dilated. Systolic function was moderately to severely reduced. - Right atrium: The atrium was mildly dilated. - Atrial septum: No defect or patent foramen ovale was identified. - Pulmonary arteries: Unable to assess PA pressures - no significant TR - Impressions: Echocardiographic evidence of RV failure but unable to determine  PA systolic pressures.    Lab 02/22/12 0230  PROBNP 1675.0*  plan: -cont current rxs  Macrocytosis etoh in history Plan: -cont thiamine, folic, MVI  Acute encephalopathy: now improved, but has ETOH history Plan: -thiamine folate and PRN benzo -watch for s/sx w/d   BABCOCK,PETE, MD 02/24/2012, 11:43 AM  Pt independently  seen and examined and available cxr's reviewed and I agree with above  findings/ imp/ plan   Sandrea Hughs, MD Pulmonary and Critical Care Medicine [facility] [phone]

## 2012-02-24 NOTE — Progress Notes (Signed)
ANTIBIOTIC CONSULT NOTE - FOLLOW UP  Pharmacy Consult for Vancomycin/Ceftazidime Indication: PNA/AECOPD  Allergies  Allergen Reactions  . Lisinopril Swelling    Patient Measurements: Height: 5\' 6"  (167.6 cm) Weight: 191 lb 5.8 oz (86.8 kg) IBW/kg (Calculated) : 63.8    Vital Signs: Temp: 98.5 F (36.9 C) (02/25 0800) Temp src: Oral (02/25 0800) BP: 177/79 mmHg (02/25 0800) Pulse Rate: 59  (02/25 0900) Intake/Output from previous day: 02/24 0701 - 02/25 0700 In: 2039 [I.V.:1385; IV Piggyback:654] Out: 3825 [Urine:3825] Intake/Output from this shift: Total I/O In: 59.6 [I.V.:55.6; IV Piggyback:4] Out: 75 [Urine:75]  Labs:  [facility] 02/24/12 0306 02/23/12 0120 02/22/12 1701 02/22/12 0230  WBC 7.9 5.9 -- 10.8*  HGB 15.0 14.6 -- 16.1  PLT 229 166 -- 180  LABCREA -- -- -- --  CREATININE 1.13 1.71* 1.61* --   Estimated Creatinine Clearance: 65.5 ml/min (by C-G formula based on Cr of 1.13).   Microbiology: 2/23 Sputum: normal flora 2/23 MRSA PCR negative 2/24 Blood x 2: ngtd  Anti Infectives 2/23 >> Avelox 400mg  IV q24h 2/24 >> Ceftazidime 2gm IV q12h 2/24 >> Vancomycin 750mg  IV q12h  Assessment: 65/M with hx COPD and CHF admitted 2/23 for AECOPD and CHF exacerbation  Intubated 2/23 d/t acute respiratory failure  Broaden ABX coverage today by adding Vanc and Fortaz to Avelox d/t increased secretions/PNA Day #2 broad spectrum antibiotics Renal function significantly improving, therefore will adjust dosages  Goal of Therapy:  Vancomycin trough level 15-20 mcg/ml  Plan:   Increase Vancomycin 1gm IV q12h  Increase Ceftazidime 2gm IV q8h  Continue current Avelox  Continue to follow renal function & cultures  Loralee Pacas, PharmD, BCPS Pager: [phone] 02/24/2012,9:53 AM

## 2012-02-24 NOTE — Procedures (Signed)
Extubation Procedure Note  Patient Details:   Name: John [name] DOB: [birth year] MRN: [id number]   Airway Documentation:     Evaluation  O2 sats: stable throughout Complications: No apparent complications Patient did tolerate procedure well. Bilateral Breath Sounds: Clear Suctioning: Airway Yes  Leonides Schanz 02/24/2012, 11:32 AM

## 2012-02-24 NOTE — Progress Notes (Signed)
1120 Pt extubated to 4 LPM nasal cannula.  BBS are equal, if somewhat coarse in upper lobes with bases diminished, HR 76, BP 169/67, and SPO2 94%.  Excellent vocalization displayed by patient.

## 2012-02-24 NOTE — Progress Notes (Signed)
UR complete 

## 2012-02-25 ENCOUNTER — Inpatient Hospital Stay (HOSPITAL_COMMUNITY): Payer: Medicare Other

## 2012-02-25 DIAGNOSIS — I509 Heart failure, unspecified: Secondary | ICD-10-CM

## 2012-02-25 DIAGNOSIS — J9600 Acute respiratory failure, unspecified whether with hypoxia or hypercapnia: Secondary | ICD-10-CM

## 2012-02-25 DIAGNOSIS — J449 Chronic obstructive pulmonary disease, unspecified: Secondary | ICD-10-CM

## 2012-02-25 LAB — CULTURE, RESPIRATORY W GRAM STAIN: Culture: NORMAL

## 2012-02-25 LAB — GLUCOSE, CAPILLARY

## 2012-02-25 LAB — BASIC METABOLIC PANEL
BUN: 27 mg/dL — ABNORMAL HIGH (ref 6–23)
Calcium: 9.4 mg/dL (ref 8.4–10.5)
GFR calc Af Amer: 74 mL/min — ABNORMAL LOW (ref >90)
GFR calc non Af Amer: 64 mL/min — ABNORMAL LOW (ref >90)
Glucose, Bld: 138 mg/dL — ABNORMAL HIGH (ref 70–99)
Potassium: 4.2 mEq/L (ref 3.5–5.1)

## 2012-02-25 LAB — CBC
HCT: 51.3 % (ref 39.0–52.0)
Hemoglobin: 16.6 g/dL (ref 13.0–17.0)
MCH: 33.7 pg (ref 26.0–34.0)
MCHC: 32.4 g/dL (ref 30.0–36.0)
RDW: 14.3 % (ref 11.5–15.5)

## 2012-02-25 MED ORDER — MOXIFLOXACIN HCL 400 MG PO TABS
400.0000 mg | ORAL_TABLET | Freq: Every day | ORAL | Status: DC
  Administered 2012-02-25 – 2012-02-26 (×2): 400 mg via ORAL
  Filled 2012-02-25 (×3): qty 1

## 2012-02-25 MED ORDER — ASPIRIN 325 MG PO TABS
325.0000 mg | ORAL_TABLET | Freq: Every day | ORAL | Status: DC
  Administered 2012-02-25 – 2012-02-27 (×3): 325 mg via ORAL
  Filled 2012-02-25 (×3): qty 1

## 2012-02-25 MED ORDER — METHYLPREDNISOLONE SODIUM SUCC 40 MG IJ SOLR
40.0000 mg | Freq: Two times a day (BID) | INTRAMUSCULAR | Status: DC
  Administered 2012-02-25 – 2012-02-26 (×2): 40 mg via INTRAVENOUS
  Filled 2012-02-25 (×2): qty 1

## 2012-02-25 MED ORDER — IPRATROPIUM-ALBUTEROL 18-103 MCG/ACT IN AERO
2.0000 | INHALATION_SPRAY | Freq: Four times a day (QID) | RESPIRATORY_TRACT | Status: DC
  Administered 2012-02-25 – 2012-02-27 (×8): 2 via RESPIRATORY_TRACT
  Filled 2012-02-25: qty 14.7

## 2012-02-25 MED ORDER — BISOPROLOL FUMARATE 5 MG PO TABS
5.0000 mg | ORAL_TABLET | Freq: Every day | ORAL | Status: DC
  Administered 2012-02-25 – 2012-02-27 (×3): 5 mg via ORAL
  Filled 2012-02-25 (×3): qty 1

## 2012-02-25 MED ORDER — INSULIN ASPART 100 UNIT/ML ~~LOC~~ SOLN
0.0000 [IU] | Freq: Three times a day (TID) | SUBCUTANEOUS | Status: DC
  Administered 2012-02-25: 5 [IU] via SUBCUTANEOUS
  Administered 2012-02-26 (×2): 3 [IU] via SUBCUTANEOUS

## 2012-02-25 MED ORDER — CLONIDINE HCL 0.1 MG PO TABS
0.1000 mg | ORAL_TABLET | Freq: Two times a day (BID) | ORAL | Status: DC
  Administered 2012-02-25 – 2012-02-27 (×4): 0.1 mg via ORAL
  Filled 2012-02-25 (×5): qty 1

## 2012-02-25 NOTE — Progress Notes (Signed)
CRITICAL VALUE ALERT  Critical value received:  CO2 44  Date of notification:  02/25/2012   Time of notification:  4:02 AM   Critical value read back:yes  Nurse who received alert:  Chapman Fitch   MD notified (1st page):  Dr. Darrick Penna  Time of first page:  4:03 AM   MD notified (2nd page):  Time of second page:  Responding MD:    Time MD responded:

## 2012-02-25 NOTE — Progress Notes (Signed)
Name: John [name] MRN: [id number] DOB: 12/08/1946  DOS:   02/22/12    [place] P[facility]  NOTE  Referring Physician: Dr Ashley [name]  Reason For Consult : Hypercapnic respiratory failure   Brief patient profile:  65 yobm  with multiple medical problems including COPD and CHF who was admitted on 2/23 for COPD and CHF exacerbation. He was being treated for both the conditions. He failed Attempts at NIPPV and therefore was intubated on 2/23  Lines/tubes OETT 2/23>>>2/25  Micro Sputum 2/23: Normal flora bcx2 2/24>>>  Antibiotics avelox (aecopd)2/23>>> ceftaz 2/24>>> 2/25 (no indication) vanc 2/24>>> 2/25 (no indication)  Tests/diagnostics Echo 2/24: . Wall thickness was increased in a pattern of mild LVH. Systolic function was normal. The estimated ejection fraction was in the range of 50% to 55%. - Left atrium: The atrium was mildly dilated.- Right ventricle: The cavity size was moderately dilated.Systolic function was moderately to severely reduced.- Right atrium: The atrium was mildly dilated. - Impressions: Echocardiographic evidence of RV failure but unable to determine PA systolic pressures.  Subjective No distress. No complaints.   Physical Examination 5lpm NP  94%  Temp:  [97.2 F (36.2 C)-98.9 F (37.2 C)] 98.7 F (37.1 C) (02/26 0800) Pulse Rate:  [64-83] 72  (02/26 1000) Resp:  [17-24] 21  (02/26 1000) BP: (112-175)/(56-84) 172/72 mmHg (02/26 0800) SpO2:  [87 %-95 %] 87 % (02/26 1000) Weight:  [86 kg (189 lb 9.5 oz)] 86 kg (189 lb 9.5 oz) (02/26 0500)      . DISCONTD: fentaNYL infusion INTRAVENOUS 100 mcg/hr (02/24/12 1100)  . DISCONTD: propofol 15 mcg/kg/min (02/24/12 1000)    Intake/Output Summary (Last 24 hours) at 02/25/12 1048 Last data filed at 02/25/12 1000  Gross per 24 hour  Intake    957 ml  Output   3850 ml  Net  -2893 ml   PEx Neuro:  Awake/alert and oriented HEENT:  oropharnyx clear Heart:  RRR, no M/R/G Lungs:  Bilateral ronchi, no  accessory muscle use Abdomen:  Soft, non tender, bowel sounds present Extremities: No cyanosis, clubbing or edema.     Lab 02/25/12 0314 02/24/12 0306 02/23/12 0120  NA 140 141 135  K 4.2 3.8 4.0  CL 91* 97 93*  CO2 44* 38* 34*  BUN 27* 21 21  CREATININE 1.15 1.13 1.71*  GLUCOSE 138* 127* 133*    Lab 02/25/12 0314 02/24/12 0306 02/23/12 0120  HGB 16.6 15.0 14.6  HCT 51.3 46.8 46.5  WBC 11.9* 7.9 5.9  PLT 244 229 166   pcxr 2/26 Improving aeration on CXR.    Assessment Acute respiratory failure in setting of Acute exacerbation of COPD w/ purulent bronchitis vs CAP, complicated by decompensated Heart failure w/ Pulmonary edema: Has responded well to diuresis, empiric antibiotics and systemic steroids. Extubated 2/25 after passing SBT, CXR continues to improve.  Plan: -cont lasix for neg fluid balance goal (will do one more day of IV) -Continue BD'ers -cont avelox for aecopd as per dashboard -wean steroids -will move to medical ward. Need to identify O2 requirements.   CHF exacerbation/decompensated heart failure:  - not candidate for ACEI  Lab 02/23/12 0120 02/22/12 1701 02/22/12 0700  CKTOTAL 69 80 84  CKMB 2.9 3.7 3.6  TROPONINI <0.30 <0.30 <0.30    Lab 02/22/12 0230  PROBNP 1675.0*  plan: -cont catapres -cont asa -cont lasix, will transition to oral regimen on 2/27 -add cardio-selective b-blocker.   Macrocytosis etoh in history Plan: -cont thiamine, folic, MVI  Acute encephalopathy:  now improved, but has ETOH history no evidence of difficulty at this point.  Plan: -thiamine folate and PRN benzo -watch for s/sx w/d   BABCOCK,PETE, 02/25/2012, 10:48 AM   Pt independently  seen and examined and available cxr's reviewed and I agree with above findings/ imp/ plan   Sandrea Hughs, MD Pulmonary and Critical Care Medicine [facility] Cell [phone]

## 2012-02-25 NOTE — Progress Notes (Signed)
CSW discussed plan for Pt with Anders Simmonds, NP and possible follow up needs. Pt could be seen at the heart failure clinic, but this is a MD to MD referral. The Heart Failure pager is [phone] for referral. Per HF CSW, if we can get him into HF clinic they will assist with a scale. This CSW also left VM to inform RN CM, Marcelle Smiling of these concerns. Pt could benefit from primary MD in community and CSW will contact community case management to assist with this process. CSW will follow up after PT/OT eval to assist with dispo.  Vennie Homans, [place] 02/25/2012 11:44 AM [phone]

## 2012-02-25 NOTE — Progress Notes (Signed)
Thank you to Dwaine Gale SW for this referral.  Please not that until this patient has an established [facility] PCP we can not initiate formal services.  However, I am available to assist with coordination of community resources prior to discharge.  For any additional questions or new referrals please contact Anibal Henderson BSN RN [facility] Liaison at [phone].

## 2012-02-26 DIAGNOSIS — J9600 Acute respiratory failure, unspecified whether with hypoxia or hypercapnia: Secondary | ICD-10-CM

## 2012-02-26 DIAGNOSIS — J449 Chronic obstructive pulmonary disease, unspecified: Secondary | ICD-10-CM

## 2012-02-26 DIAGNOSIS — I509 Heart failure, unspecified: Secondary | ICD-10-CM

## 2012-02-26 LAB — GLUCOSE, CAPILLARY
Glucose-Capillary: 104 mg/dL — ABNORMAL HIGH (ref 70–99)
Glucose-Capillary: 196 mg/dL — ABNORMAL HIGH (ref 70–99)

## 2012-02-26 MED ORDER — FUROSEMIDE 40 MG PO TABS
40.0000 mg | ORAL_TABLET | Freq: Two times a day (BID) | ORAL | Status: DC
  Administered 2012-02-26 – 2012-02-27 (×2): 40 mg via ORAL
  Filled 2012-02-26 (×3): qty 1

## 2012-02-26 MED ORDER — DIPHENHYDRAMINE HCL 50 MG PO CAPS
50.0000 mg | ORAL_CAPSULE | Freq: Once | ORAL | Status: AC
  Administered 2012-02-26: 50 mg via ORAL
  Filled 2012-02-26: qty 1

## 2012-02-26 MED ORDER — PREDNISONE 20 MG PO TABS
40.0000 mg | ORAL_TABLET | Freq: Every day | ORAL | Status: DC
Start: 2012-02-27 — End: 2012-02-27
  Administered 2012-02-27: 40 mg via ORAL
  Filled 2012-02-26: qty 2

## 2012-02-26 NOTE — Progress Notes (Signed)
eLink Physician-Brief Progress Note Patient Name: John [name] DOB: 01/14/1946 MRN: [id number]  Date of Service  02/26/2012   HPI/Events of Note   Pt requesting something to help sleep  eICU Interventions   Benadryl x 1   Intervention Category Minor Interventions: Routine modifications to care plan (e.g. PRN medications for pain, fever)  Ticia Virgo S. 02/26/2012, 9:27 PM

## 2012-02-26 NOTE — Progress Notes (Signed)
Name: John [name] MRN: [id number] DOB: August 24, 1946  DOS:   02/22/12    [place] PCCM     Referring Physician: Dr Ashley [name]  Reason For Consult : Hypercapnic respiratory failure   Brief patient profile:  65 yobm  with multiple medical problems including COPD and CHF who was admitted on 2/23 for COPD and CHF exacerbation. He was being treated for both the conditions. He failed Attempts at NIPPV and therefore was intubated on 2/23  Lines/tubes OETT 2/23>>>2/25  Micro Sputum 2/23: Normal flora bcx2 2/24>>>  Antibiotics avelox (aecopd)2/23>>> ceftaz 2/24>>> 2/25 (no indication) vanc 2/24>>> 2/25 (no indication)  Tests/diagnostics Echo 2/24: . Wall thickness was increased in a pattern of mild LVH. Systolic function was normal. The estimated ejection fraction was in the range of 50% to 55%. - Left atrium: The atrium was mildly dilated.- Right ventricle: The cavity size was moderately dilated.Systolic function was moderately to severely reduced.- Right atrium: The atrium was mildly dilated. - Impressions: Echocardiographic evidence of RV failure but unable to determine PA systolic pressures.  Subjective No distress. No complaints.   Physical Examination 5lpm NP  94%  Temp:  [97.7 F (36.5 C)-98.6 F (37 C)] 97.8 F (36.6 C) (02/27 0419) Pulse Rate:  [59-69] 63  (02/27 1030) Resp:  [20-22] 20  (02/27 0419) BP: (127-173)/(60-83) 148/63 mmHg (02/27 1030) SpO2:  [89 %-94 %] 93 % (02/27 0820) on 4lpm np      Intake/Output Summary (Last 24 hours) at 02/26/12 1136 Last data filed at 02/26/12 1100  Gross per 24 hour  Intake   1230 ml  Output   1450 ml  Net   -220 ml   PEx Neuro:  Awake/alert and oriented HEENT:  oropharnyx clear Heart:  RRR, no M/R/G Lungs:  Bilateral ronchi, no accessory muscle use, exp wheeze after cough Abdomen:  Soft, non tender, bowel sounds present Extremities: No cyanosis, clubbing or edema.     Lab 02/25/12 0314 02/24/12 0306 02/23/12 0120  NA  140 141 135  K 4.2 3.8 4.0  CL 91* 97 93*  CO2 44* 38* 34*  BUN 27* 21 21  CREATININE 1.15 1.13 1.71*  GLUCOSE 138* 127* 133*    Lab 02/25/12 0314 02/24/12 0306 02/23/12 0120  HGB 16.6 15.0 14.6  HCT 51.3 46.8 46.5  WBC 11.9* 7.9 5.9  PLT 244 229 166   pcxr 2/26 Improving aeration on CXR.    Assessment Acute respiratory failure in setting of Acute exacerbation of COPD w/ purulent bronchitis vs CAP, complicated by decompensated Heart failure w/ Pulmonary edema: Has responded well to diuresis, empiric antibiotics and systemic steroids. Extubated 2/25 after passing SBT, CXR continues to improve.  Plan: -cont lasix for neg fluid balance, will transition to oral dosing -Continue BD'ers -cont avelox for aecopd as per dashboard -wean steroids - Need to identify O2 requirements.   CHF exacerbation/decompensated heart failure:  - not candidate for ACEI  Lab 02/23/12 0120 02/22/12 1701 02/22/12 0700  CKTOTAL 69 80 84  CKMB 2.9 3.7 3.6  TROPONINI <0.30 <0.30 <0.30    Lab 02/22/12 0230  PROBNP 1675.0*  plan: -cont catapres -cont asa -cont lasix, transition to oral regimen on 2/27 -added cardio-selective b-blocker 2/27 -ask cards to see for out-pt CHF clinic establishment  Macrocytosis etoh in history Plan: -cont thiamine, folic, MVI  Acute encephalopathy: now improved, but has ETOH history no evidence of difficulty at this point.  Plan: -thiamine folate and PRN benzo -watch for s/sx w/d  Dispo: prob  can get him home on 2/28  BABCOCK,PETE, 02/26/2012, 11:36 AM   Pt independently  seen and examined and available cxr's reviewed and I agree with above findings/ imp/ plan   Sandrea Hughs, MD Pulmonary and Critical Care Medicine [facility] Healthcare Cell [phone]

## 2012-02-26 NOTE — Evaluation (Signed)
Occupational Therapy Evaluation Patient Details Name: John [name] MRN: [id number] DOB: Oct 15, 1946 Today's Date: 02/26/2012  Problem List:  Patient Active Problem List  Diagnoses  . COPD exacerbation  . CHF (congestive heart failure)  . DM (diabetes mellitus)  . HTN (hypertension)  . CAD (coronary artery disease)  . Acute on chronic systolic heart failure    Past Medical History:  Past Medical History  Diagnosis Date  . CHF (congestive heart failure)   . MI (myocardial infarction)   . COPD (chronic obstructive pulmonary disease)   . Diabetes mellitus   . Hypertension   . Gout   . Hyperlipemia    Past Surgical History:  Past Surgical History  Procedure Date  . Appendectomy     OT Assessment/Plan/Recommendation OT Assessment Clinical Impression Statement: 66 year old male admitted for COPD exacerbation and CHF.  Pt currently close supervision/setup for most simulated selfcare tasks.  Needs use of supplemental oxygen to keep sats greater than 90% with activity.  Will have PRN supervision from his daughter and grandchild.  Recommend use of shower seat at home to conserve energy with bathing task, otherwise no OT needs.  Feel his higher level balance issues can be addressed through PT services.   OT Recommendation/Assessment: Patient does not need any further OT services OT Recommendation Follow Up Recommendations: No OT follow up Equipment Recommended: None recommended by OT  OT Evaluation Precautions/Restrictions  Precautions Required Braces or Orthoses: No Restrictions Weight Bearing Restrictions: No Prior Functioning Home Living Lives With: Family (brother) Dolores Lory Help From: Other (Comment) (brother away most of the time; neighbors) Type of Home: House Home Layout: One level Home Access: Stairs to enter Entrance Stairs-Rails: Can reach both Entrance Stairs-Number of Steps: 1 Bathroom Shower/Tub: Forensic scientist: Standard Bathroom  Accessibility: Yes How Accessible: Accessible via walker Home Adaptive Equipment: Crutches;Walker - rolling;Shower chair with back Additional Comments: Pt plans to discharge to daughter's house. Prior Function Level of Independence: Independent with basic ADLs;Independent with homemaking with ambulation;Independent with gait;Independent with transfers Driving: No Vocation: Retired ADL ADL Eating/Feeding: Simulated;Independent Where Assessed - Eating/Feeding: Edge of bed Grooming: Simulated;Supervision/safety Where Assessed - Grooming: Standing at sink Upper Body Bathing: Simulated;Set up Where Assessed - Upper Body Bathing: Unsupported;Sitting, bed Lower Body Bathing: Simulated;Supervision/safety Where Assessed - Lower Body Bathing: Sit to stand from bed Upper Body Dressing: Simulated;Set up Where Assessed - Upper Body Dressing: Unsupported;Sitting, bed Lower Body Dressing: Supervision/safety;Performed Lower Body Dressing Details (indicate cue type and reason): Donned gripper sock without any difficulty in sitting. Where Assessed - Lower Body Dressing: Sitting, bed;Unsupported Toilet Transfer: Performed;Supervision/safety Toilet Transfer Method: Proofreader: Comfort height toilet Toileting - Clothing Manipulation: Simulated;Supervision/safety Where Assessed - Toileting Clothing Manipulation: Sit to stand from 3-in-1 or toilet Toileting - Hygiene: Simulated;Supervision/safety Where Assessed - Toileting Hygiene: Sit to stand from 3-in-1 or toilet Tub/Shower Transfer: Simulated;Supervision/safety Tub/Shower Transfer Method: Science writer: Other (comment) (Simulated stepping over trashcan to simulte tub edge.) Equipment Used:  (O2) Ambulation Related to ADLs: Pt overall close supervision for mobility without assistive device.  Noted pt with occassional stagger to the right but was able to self manage without assistance or cueing.  Also  able to retreive item from the floor without difficulty. ADL Comments: O2 sats at 80% on room air.  Needs 2-3 mins rest and 4ls of supplemental O2 to bring sats back up to 91 %.   Vision/Perception  Vision - History Baseline Vision: No visual deficits Patient Visual Report: No  change from baseline Perception Perception: Within Functional Limits Praxis Praxis: Intact Cognition Cognition Arousal/Alertness: Awake/alert Overall Cognitive Status: Appears within functional limits for tasks assessed Orientation Level: Oriented X4 Sensation/Coordination Sensation Light Touch: Appears Intact Stereognosis: Not tested Hot/Cold: Not tested Proprioception: Not tested Coordination Gross Motor Movements are Fluid and Coordinated: Yes Fine Motor Movements are Fluid and Coordinated: Yes Extremity Assessment RUE Assessment RUE Assessment: Within Functional Limits LUE Assessment LUE Assessment: Within Functional Limits Mobility  Bed Mobility Bed Mobility: Yes Supine to Sit: 6: Modified independent (Device/Increase time) Sitting - Scoot to Edge of Bed: 6: Modified independent (Device/Increase time) Transfers Transfers: Yes Sit to Stand: 5: Supervision;From toilet;Without upper extremity assist  End of Session OT - End of Session Equipment Utilized During Treatment: Gait belt Activity Tolerance: Patient tolerated treatment well Patient left: in bed;with call bell in reach;with family/visitor present General Behavior During Session: [facility] for tasks performed Cognition: [facility] for tasks performed   Zendaya Groseclose OTR/L 02/26/2012, 3:38 PM  Pager number [phone]

## 2012-02-26 NOTE — Plan of Care (Signed)
Problem: Phase II Progression Outcomes Goal: Progress activities as ordered Outcome: Completed/Met Date Met:  02/26/12 OT evaluation completed, pt supervision level for simulated selfcare. Perrin Maltese, OTR/L Pager number [phone] 02/26/2012

## 2012-02-26 NOTE — Progress Notes (Signed)
Physical Therapy Evaluation Patient Details Name: John [name] MRN: [id number] DOB: [birth year] Today's Date: 02/26/2012  Problem List:  Patient Active Problem List  Diagnoses  . COPD exacerbation  . CHF (congestive heart failure)  . DM (diabetes mellitus)  . HTN (hypertension)  . CAD (coronary artery disease)  . Acute on chronic systolic heart failure    Past Medical History:  Past Medical History  Diagnosis Date  . CHF (congestive heart failure)   . MI (myocardial infarction)   . COPD (chronic obstructive pulmonary disease)   . Diabetes mellitus   . Hypertension   . Gout   . Hyperlipemia    Past Surgical History:  Past Surgical History  Procedure Date  . Appendectomy     PT Assessment/Plan/Recommendation PT Assessment Clinical Impression Statement: Pt presents with a medical diagnosis of COPD exacerbation with decreased endurance and slight balance loss with increased speed. Pt will benefit from skilled PT in the acute care setting for increased safety awareness and functional mobility prior to d/c (Sats down from 93 to 86 on 4L with ambulation; breathing ed.) PT Recommendation/Assessment: Patient will need skilled PT in the acute care venue PT Problem List: Decreased activity tolerance;Decreased mobility;Decreased safety awareness;Cardiopulmonary status limiting activity PT Therapy Diagnosis : Difficulty walking PT Plan PT Frequency: Min 3X/week PT Treatment/Interventions: DME instruction;Gait training;Stair training;Functional mobility training;Therapeutic activities;Therapeutic exercise;Balance training;Patient/family education PT Recommendation Follow Up Recommendations: No PT follow up;Supervision - Intermittent Equipment Recommended: Other (comment) (TBD) PT Goals  Acute Rehab PT Goals PT Goal Formulation: With patient Time For Goal Achievement: 2 weeks Pt will go Supine/Side to Sit: with modified independence PT Goal: Supine/Side to Sit - Progress: Goal set  today Pt will go Sit to Stand: with modified independence PT Goal: Sit to Stand - Progress: Goal set today Pt will go Stand to Sit: with modified independence PT Goal: Stand to Sit - Progress: Goal set today Pt will Ambulate: >150 feet;with modified independence;with least restrictive assistive device PT Goal: Ambulate - Progress: Goal set today Pt will Go Up / Down Stairs: 1-2 stairs;with rail(s);with supervision PT Goal: Up/Down Stairs - Progress: Goal set today Pt will Perform Home Exercise Program: Independently PT Goal: Perform Home Exercise Program - Progress: Goal set today  PT Evaluation Precautions/Restrictions  Restrictions Weight Bearing Restrictions: No Prior Functioning  Home Living Lives With: Family (brother) Dolores Lory Help From: Other (Comment) (brother away most of the time; neighbors) Type of Home: House Home Layout: One level Home Access: Stairs to enter Entrance Stairs-Rails: Can reach both Entrance Stairs-Number of Steps: 1 Bathroom Shower/Tub: Forensic scientist: Standard Bathroom Accessibility: Yes How Accessible: Accessible via walker Home Adaptive Equipment: Crutches;Walker - rolling Prior Function Level of Independence: Independent with basic ADLs;Independent with homemaking with ambulation;Independent with gait;Independent with transfers Able to Take Stairs?: Yes Driving: No Vocation: Retired Leisure: Hobbies-yes (Comment) Comments: golf and baseketball Cognition Cognition Arousal/Alertness: Awake/alert Overall Cognitive Status: Appears within functional limits for tasks assessed Orientation Level: Oriented X4 Sensation/Coordination Sensation Light Touch: Appears Intact Extremity Assessment RLE Assessment RLE Assessment: Within Functional Limits LLE Assessment LLE Assessment: Within Functional Limits Mobility (including Balance) Bed Mobility Bed Mobility: Yes Supine to Sit: 6: Modified independent (Device/Increase  time) Sitting - Scoot to Edge of Bed: 6: Modified independent (Device/Increase time) Transfers Transfers: Yes Sit to Stand: 4: Min assist;With upper extremity assist;From bed Sit to Stand Details (indicate cue type and reason): Min assist for stability secondary to slight balance loss with initiation Stand to Sit: 5: Supervision;With  upper extremity assist;To chair/3-in-1 Stand to Sit Details: VC for hand placement for safety Ambulation/Gait Ambulation/Gait: Yes Ambulation/Gait Assistance: 4: Min assist ([place] to min assist) Ambulation/Gait Assistance Details (indicate cue type and reason): Min guard for majority of ambulation, min assist at times secondary to balance loss with increased velocity. Pt with narrow base of support throughout ambulation and slight balance loss with need for UE assist. Ambulation Distance (Feet): 100 Feet Assistive device: None Gait Pattern: Step-to pattern;Trunk flexed;Decreased trunk rotation Gait velocity: Decreased gait speed Stairs: No  Balance Balance Assessed: Yes Dynamic Standing Balance Dynamic Standing - Balance Support: No upper extremity supported;During functional activity (during ambulation) Dynamic Standing - Level of Assistance: 4: Min assist Dynamic Standing - Comments: min assist intermittently with balance loss secondary to narrow base of support  Exercise    End of Session PT - End of Session Equipment Utilized During Treatment: Gait belt Activity Tolerance: Patient tolerated treatment well Patient left: in chair;with call bell in reach Nurse Communication: Mobility status for transfers;Mobility status for ambulation General Behavior During Session: [facility] for tasks performed Cognition: [facility] for tasks performed  Milana Kidney 02/26/2012, 10:59 AM  02/26/2012 Milana Kidney DPT PAGER: [phone] OFFICE: [phone]

## 2012-02-27 DIAGNOSIS — I509 Heart failure, unspecified: Secondary | ICD-10-CM

## 2012-02-27 DIAGNOSIS — J449 Chronic obstructive pulmonary disease, unspecified: Secondary | ICD-10-CM

## 2012-02-27 DIAGNOSIS — J9600 Acute respiratory failure, unspecified whether with hypoxia or hypercapnia: Secondary | ICD-10-CM

## 2012-02-27 LAB — CBC
MCH: 33.2 pg (ref 26.0–34.0)
MCV: 108.3 fL — ABNORMAL HIGH (ref 78.0–100.0)
Platelets: 232 10*3/uL (ref 150–400)
RDW: 14.1 % (ref 11.5–15.5)
WBC: 9.2 10*3/uL (ref 4.0–10.5)

## 2012-02-27 LAB — BASIC METABOLIC PANEL
Calcium: 9 mg/dL (ref 8.4–10.5)
Creatinine, Ser: 1.21 mg/dL (ref 0.50–1.35)
GFR calc Af Amer: 70 mL/min — ABNORMAL LOW (ref >90)
Sodium: 142 mEq/L (ref 135–145)

## 2012-02-27 LAB — PRO B NATRIURETIC PEPTIDE: Pro B Natriuretic peptide (BNP): 152.3 pg/mL — ABNORMAL HIGH (ref 0–125)

## 2012-02-27 MED ORDER — ALBUTEROL SULFATE HFA 108 (90 BASE) MCG/ACT IN AERS: 2.0000 | INHALATION_SPRAY | RESPIRATORY_TRACT | Status: DC | PRN

## 2012-02-27 MED ORDER — FOLIC ACID 1 MG PO TABS: 1.0000 mg | ORAL_TABLET | Freq: Every day | ORAL | Status: DC

## 2012-02-27 MED ORDER — IPRATROPIUM-ALBUTEROL 18-103 MCG/ACT IN AERO: 2.0000 | INHALATION_SPRAY | Freq: Four times a day (QID) | RESPIRATORY_TRACT | Status: DC

## 2012-02-27 MED ORDER — FUROSEMIDE 40 MG PO TABS: 40.0000 mg | ORAL_TABLET | Freq: Two times a day (BID) | ORAL | Status: DC

## 2012-02-27 MED ORDER — THIAMINE HCL 100 MG PO TABS: 100.0000 mg | ORAL_TABLET | Freq: Every day | ORAL | Status: DC

## 2012-02-27 MED ORDER — MOXIFLOXACIN HCL 400 MG PO TABS: 400.0000 mg | ORAL_TABLET | Freq: Every day | ORAL | Status: AC

## 2012-02-27 MED ORDER — PANTOPRAZOLE SODIUM 40 MG PO TBEC: 40.0000 mg | DELAYED_RELEASE_TABLET | Freq: Two times a day (BID) | ORAL | Status: DC

## 2012-02-27 MED ORDER — BISOPROLOL FUMARATE 5 MG PO TABS: 5.0000 mg | ORAL_TABLET | Freq: Every day | ORAL | Status: DC

## 2012-02-27 MED ORDER — CLONIDINE HCL 0.1 MG PO TABS: 0.1000 mg | ORAL_TABLET | Freq: Two times a day (BID) | ORAL | Status: DC

## 2012-02-27 MED ORDER — PREDNISONE 10 MG PO TABS: ORAL_TABLET | ORAL | Status: DC

## 2012-02-27 NOTE — Progress Notes (Signed)
Pt's PIV was occluded and removed. Pan is for d/c tomorrow afternoon and meds have been switched to PO. Dr. Craige Cotta, on call pulmonologist, gave verbal permission to leave pt w/o IV access. Eugene Garnet [facility] 02/27/2012 12:29 AM

## 2012-02-27 NOTE — Progress Notes (Signed)
CARE MANAGEMENT NOTE 02/27/2012  Patient:  John [name], John [name]   Account Number:  [id number]  Date Initiated:  02/27/2012  Documentation initiated by:  Kambri Dismore  Subjective/Objective Assessment:   pt with hx of copd now with excerbation and pna     Action/Plan:   lives at home   Anticipated DC Date:  02/27/2012   Anticipated DC Plan:  HOME W HOME HEALTH SERVICES  In-house referral  NA      DC Planning Services  CM consult      [facility] Choice  DURABLE MEDICAL EQUIPMENT   Choice offered to / List presented to:  C-1 Patient   DME arranged  OXYGEN      DME agency  [facility]     HH arranged  NA      HH agency  NA   Status of service:  Completed, signed off Medicare Important Message given?  NA - LOS <3 / Initial given by admissions (If response is "NO", the following Medicare IM given date fields will be blank) Date Medicare IM given:  02/22/2012 Date Additional Medicare IM given:    Discharge Disposition:  HOME W HOME HEALTH SERVICES  Per UR Regulation:  Reviewed for med. necessity/level of care/duration of stay  Comments:  [id number]/Eyla Tallon,RN,BSn,CCM

## 2012-02-27 NOTE — Progress Notes (Signed)
Patient discharged to home.  RN gave patient and daughter instructions and reviewed prescriptions in detail.  Daughter and patient verbalized understanding.

## 2012-02-27 NOTE — Discharge Instructions (Signed)
Heart Failure Heart failure (HF) is a condition in which the heart has trouble pumping blood. This means your heart does not pump blood efficiently for your body to work well. In some cases of HF, fluid may back up into your lungs or you may have swelling (edema) in your lower legs. HF is a long-term (chronic) condition. It is important for you to take good care of yourself and follow your caregiver's treatment plan. CAUSES   Health conditions:   High blood pressure (hypertension) causes the heart muscle to work harder than normal. When pressure in the blood vessels is high, the heart needs to pump (contract) with more force in order to circulate blood throughout the body. High blood pressure eventually causes the heart to become stiff and weak.   Coronary artery disease (CAD) is the buildup of cholesterol and fat (plaques) in the arteries of the heart. The blockage in the arteries deprives the heart muscle of oxygen and blood. This can cause chest pain and may lead to a heart attack. High blood pressure can also contribute to CAD.   Heart attack (myocardial infarction) occurs when 1 or more arteries in the heart become blocked. The loss of oxygen damages the muscle tissue of the heart. When this happens, part of the heart muscle dies. The injured tissue does not contract as well and weakens the heart's ability to pump blood.   Abnormal heart valves can cause HF when the heart valves do not open and close properly. This makes the heart muscle pump harder to keep the blood flowing.   Heart muscle disease (cardiomyopathy or myocarditis) is damage to the heart muscle from a variety of causes. These can include drug or alcohol abuse, infections, or unknown reasons. These can increase the risk of HF.   Lung disease makes the heart work harder because the lungs do not work properly. This can cause a strain on the heart leading it to fail.   Diabetes increases the risk of HF. High blood sugar contributes  to high fat (lipid) levels in the blood. Diabetes can also cause slow damage to tiny blood vessels that carry important nutrients to the heart muscle. When the heart does not get enough oxygen and food, it can cause the heart to become weak and stiff. This leads to a heart that does not contract efficiently.   Other diseases can contribute to HF. These include abnormal heart rhythms, thyroid problems, and low blood counts (anemia).   Unhealthy lifestyle habits:   Obesity.   Smoking.   Eating foods high in fat and cholesterol.   Eating or drinking beverages high in salt.   Drug or alcohol abuse.   Lack of exercise.  SYMPTOMS  HF symptoms may vary and can be hard to detect. Symptoms may include:  Shortness of breath with activity, such as climbing stairs.   Persistent cough.   Swelling of the feet, ankles, legs, or abdomen.   Unexplained weight gain.   Difficulty breathing when lying flat.   Waking from sleep because of the need to sit up and get more air.   Rapid heartbeat.   Fatigue and loss of energy.   Feeling lightheaded or close to fainting.  DIAGNOSIS  A diagnosis of HF is based on your history, symptoms, physical examination, and diagnostic tests. Diagnostic tests for HF may include:  EKG.   Chest X-ray.   Blood tests.   Exercise stress test.   Blood oxygen test (arterial blood gas).   Evaluation   by a heart doctor (cardiologist).   Ultrasound evaluation of the heart (echocardiogram).   Heart artery test to look for blockages (angiogram).   Radioactive imaging to look at the heart (radionuclide test).  TREATMENT  Treatment is aimed at managing the symptoms of HF. Medicines, lifestyle changes, or surgical intervention may be necessary to treat HF.  Medicines to help treat HF may include:   Angiotensin-converting enzyme (ACE) inhibitors. These block the effects of a blood protein called angiotensin-converting enzyme. ACE inhibitors relax (dilate) the  blood vessels and help lower blood pressure. This decreases the workload of the heart, slows the progression of HF, and improves symptoms.   Angiotensin receptor blockers (ARBs). These medications work similar to ACE inhibitors. ARBs may be an alternative for people who cannot tolerate an ACE inhibitor.   Aldosterone antagonists. This medication helps get rid of extra fluid from your body. This lowers the volume of blood the heart has to pump.   Water pills (diuretics). Diuretics cause the kidneys to remove salt and water from the blood. The extra fluid is removed by urination. By removing extra fluid from the body, diuretics help lower the workload of the heart and help prevent fluid buildup in the lungs so breathing is easier.   Beta blockers. These prevent the heart from beating too fast and improve heart muscle strength. Beta blockers help maintain a normal heart rate, control blood pressure, and improve HF symptoms.   Digitalis. This increases the force of the heartbeat and may be helpful to people with HF or heart rhythm problems.   Healthy lifestyle changes include:   Stopping smoking.   Eating a healthy diet. Avoid foods high in fat. Avoid foods fried in oil or made with fat. A dietician can help with healthy food choices.   Limiting how much salt you eat.   Limiting alcohol intake to no more than 1 drink per day for women and 2 drinks per day for men. Drinking more than that is harmful to your heart. If your heart has already been damaged by alcohol or you have severe HF, drinking alcohol should be stopped completely.   Exercising as directed by your caregiver.   Surgical treatment for HF may include:   Procedures to open blocked arteries, repair damaged heart valves, or remove damaged heart muscle tissue.   A pacemaker to help heart muscle function and to control certain abnormal heart rhythms.   A defibrillator to possibly prevent sudden cardiac death.  HOME CARE  INSTRUCTIONS   Activity level. Your caregiver can help you determine what type of exercise program may be helpful. It is important to maintain your strength. Pace your physical activity to avoid shortness of breath or chest pain. Rest for 1 hour before and after meals. A cardiac rehabilitation program may be helpful to some people with HF.   Diet. Eat a heart healthy diet. Food choices should be low in saturated fat and cholesterol. Talk to a dietician to learn about heart healthy foods.   Salt intake. When you have HF, you need to limit the amount of salt you eat. Eat less than 1500 milligrams (mg) of salt per day or as recommended by your caregiver.   Weight monitoring. Weigh yourself every day. You should weigh yourself in the morning after you urinate and before you eat breakfast. Wear the same amount of clothing each time you weigh yourself. Record your weight daily. Bring your recorded weights to your clinic visits. Tell your caregiver right away if   you have gained 3 lb/1.4 kg in 1 day, or 5 lb/2.3 kg in a week or whatever amount you were told to report.   Blood pressure monitoring. This should be done as directed by your caregiver. A home blood pressure cuff can be purchased at a drugstore. Record your blood pressure numbers and bring them to your clinic visits. Tell your caregiver if you become dizzy or lightheaded upon standing up.   Smoking. If you are currently a smoker, it is time to quit. Nicotine makes your heart work harder by causing your blood vessels to constrict. Do not use nicotine gum or patches before talking to your caregiver.   Follow up. Be sure to schedule a follow-up visit with your caregiver. Keep all your appointments.  SEEK MEDICAL CARE IF:   Your weight increases by 3 lb/1.4 kg in 1 day or 5 lb/2.3 kg in a week.   You notice increasing shortness of breath that is unusual for you. This may happen during rest, sleep, or with activity.   You cough more than normal,  especially with physical activity.   You notice more swelling in your hands, feet, ankles, or belly (abdomen).   You are unable to sleep because it is hard to breathe.   You cough up bloody mucus (sputum).   You begin to feel "jumping" or "fluttering" sensations (palpitations) in your chest.  SEEK IMMEDIATE MEDICAL CARE IF:   You have severe chest pain or pressure which may include symptoms such as:   Pain or pressure in the arms, neck, jaw, or back.   Feeling sweaty.   Feeling sick to your stomach (nauseous).   Feeling short of breath while at rest.   Having a fast or irregular heartbeat.   You experience stroke symptoms. These symptoms include:   Facial weakness or numbness.   Weakness or numbness in an arm, leg, or on one side of your body.   Blurred vision.   Difficulty talking or thinking.   Dizziness or fainting.   Severe headache.  THESE ARE MEDICAL EMERGENCIES. Do not wait to see if the symptoms go away. Call your local emergency services (911 in [place] DO NOT drive yourself to the hospital. IMPORTANT  Make a list of every medicine, vitamin, or herbal supplement you are taking. Keep the list with you at all times. Show it to your caregiver at every visit. Keep the list up-to-date.   Ask your caregiver or pharmacist to write an explanation of each medicine you are taking. This should include:   Why you are taking it.   The possible side effects.   The best time of day to take it.   Foods to take with it or what foods to avoid.   When to stop taking it.  MAKE SURE YOU:   Understand these instructions.   Will watch your condition.   Will get help right away if you are not doing well or get worse.  Document Released: 12/16/2005 Document Revised: 08/28/2011 Document Reviewed: 03/30/2010 [employer] Patient Information 2012 [employer]. 

## 2012-02-27 NOTE — Progress Notes (Signed)
Name: John [name] MRN: [id number] DOB: 22-Sep-1946  DOS:   02/22/12    [place] [facility]     Referring Physician: Dr Ashley [name]  Reason For Consult : Hypercapnic respiratory failure   Brief patient profile:  65 yobm  with multiple medical problems including COPD and CHF who was admitted on 2/23 for COPD and CHF exacerbation. He was being treated for both the conditions. He failed Attempts at NIPPV and therefore was intubated on 2/23  Lines/tubes OETT 2/23>>>2/25  Micro Sputum 2/23: Normal flora bcx2 2/24>>>  Antibiotics avelox (aecopd)2/23>>> ceftaz 2/24>>> 2/25 (no indication) vanc 2/24>>> 2/25 (no indication)  Tests/diagnostics Echo 2/24: . Wall thickness was increased in a pattern of mild LVH. Systolic function was normal. The estimated ejection fraction was in the range of 50% to 55%. - Left atrium: The atrium was mildly dilated.- Right ventricle: The cavity size was moderately dilated.Systolic function was moderately to severely reduced.- Right atrium: The atrium was mildly dilated. - Impressions: Echocardiographic evidence of RV failure but unable to determine PA systolic pressures.  Subjective No distress. No complaints. He does drop to 58% on Room air when he ambulates without oxygen. He tolerates this surprisingly well.   Physical Examination 5lpm NP  94%  Temp:  [97.9 F (36.6 C)-99.2 F (37.3 C)] 99.2 F (37.3 C) (02/28 0900) Pulse Rate:  [60-71] 71  (02/28 0900) Resp:  [18] 18  (02/28 0900) BP: (141-155)/(67-74) 141/70 mmHg (02/28 0900) SpO2:  [90 %-99 %] 96 % (02/28 0900) Weight:  [83.6 kg (184 lb 4.9 oz)] 83.6 kg (184 lb 4.9 oz) (02/28 0612) on 4lpm np      Intake/Output Summary (Last 24 hours) at 02/27/12 1120 Last data filed at 02/26/12 1800  Gross per 24 hour  Intake    480 ml  Output    550 ml  Net    -70 ml   PEx Neuro:  Awake/alert and oriented HEENT:  oropharnyx clear Heart:  RRR, no M/R/G Lungs:  Bilateral ronchi, no accessory muscle use,  exp wheeze after cough Abdomen:  Soft, non tender, bowel sounds present Extremities: No cyanosis, clubbing or edema.     Lab 02/27/12 0332 02/25/12 0314 02/24/12 0306  NA 142 140 141  K 3.7 4.2 3.8  CL 91* 91* 97  CO2 >45* 44* 38*  BUN 34* 27* 21  CREATININE 1.21 1.15 1.13  GLUCOSE 88 138* 127*    Lab 02/27/12 0332 02/25/12 0314 02/24/12 0306  HGB 15.6 16.6 15.0  HCT 50.9 51.3 46.8  WBC 9.2 11.9* 7.9  PLT 232 244 229   pcxr 2/26 Improving aeration on CXR.    Assessment Acute respiratory failure in setting of Acute exacerbation of COPD w/ purulent bronchitis vs CAP, complicated by decompensated Heart failure w/ Pulmonary edema: Has responded well to diuresis, empiric antibiotics and systemic steroids. Extubated 2/25 after passing SBT, CXR continues to improve.  Plan: -home on lasix -Continue BD'ers -cont avelox for aecopd as per dashboard -wean steroids - home on 4 liters with titration to 5 liters with activity  CHF exacerbation/decompensated heart failure:  - not candidate for ACEI  Lab 02/23/12 0120 02/22/12 1701 02/22/12 0700  CKTOTAL 69 80 84  CKMB 2.9 3.7 3.6  TROPONINI <0.30 <0.30 <0.30    Lab 02/27/12 0332 02/22/12 0230  PROBNP 152.3* 1675.0*  plan: -cont catapres -cont asa -cont lasix -added cardio-selective b-blocker 2/27 -cards to see for out-pt CHF clinic establishment  Macrocytosis etoh in history Plan: -cont thiamine, folic, MVI  Acute encephalopathy: now improved, but has ETOH history no evidence of difficulty at this point.  Plan: -thiamine folate and PRN benzo     BABCOCK,PETE, 02/27/2012, 11:20 AM      Pt independently  seen and examined and available cxr's reviewed and I agree with above findings/ imp/ plan   Sandrea Hughs, MD Pulmonary and Critical Care Medicine [facility] Cell [phone]

## 2012-02-27 NOTE — Progress Notes (Signed)
SATURATION QUALIFICATIONS:  Patient Saturations on Room Air at Rest = 67%  Patient Saturations on Room Air while Ambulating = 50%  Patient Saturations on  Liters of oxygen while Ambulating = % Did not ambulate patient with oxygen.

## 2012-02-27 NOTE — Progress Notes (Signed)
CRITICAL VALUE ALERT  Critical value received: CO2 >45   Date of notification:  02/27/2012    Time of notification:  0500   Critical value read back:yes  Nurse who received alert:  Eugene Garnet, RN  MD notified (1st page):  Dr. Warden Fillers  Time of conversation:  0515   Pt's O2 sats are 98-99 on O2 @ 4L. Decreased O2 to 3L. Dr. Craige Cotta notified. Will continue to monitor. Eugene Garnet [facility] 02/27/2012 5:23 AM

## 2012-02-27 NOTE — Discharge Summary (Signed)
Physician Discharge Summary  Patient ID: John [name] MRN: [id number] DOB/AGE: 1946/07/07 66 y.o.  Admit date: 02/22/2012 Discharge date: 02/27/2012  Admission Diagnoses: Acute respiratory failure  Discharge Diagnoses:  Principal Problem:  *COPD exacerbation Active Problems:  CHF (congestive heart failure)  DM (diabetes mellitus)  HTN (hypertension)  CAD (coronary artery disease)  Acute on chronic systolic heart failure  Lines/tubes  OETT 2/23>>>2/25   Micro  Sputum 2/23: Normal flora  bcx2 2/24>>>   Antibiotics  avelox (aecopd)2/23>>>  ceftaz 2/24>>> 2/25 (no indication)  vanc 2/24>>> 2/25 (no indication)   Tests/diagnostics  Echo 2/24: . Wall thickness was increased in a pattern of mild LVH. Systolic function was normal. The estimated ejection fraction was in the range of 50% to 55%. - Left atrium: The atrium was mildly dilated.- Right ventricle: The cavity size was moderately dilated.Systolic function was moderately to severely reduced.- Right atrium: The atrium was mildly dilated. - Impressions: Echocardiographic evidence of RV failure but unable to determine PA systolic pressures.  Brief patient profile:  25 yobm with multiple medical problems including COPD and CHF who was admitted on 2/23 for COPD and CHF exacerbation. He was being treated for both the conditions. He failed Attempts at NIPPV and therefore was intubated on 2/23  Hospital Course:  Acute respiratory failure in setting of Acute exacerbation of COPD w/ purulent bronchitis vs CAP, complicated by decompensated Heart failure w/ Pulmonary edema: He was admitted by the IM service initially with plans to diurese and treat AECOPD. He developed progressive hypercarbia and increased respiratory distress requiring intubation on 2/23. He was moved to the ICU. Treatment consisted of empiric antibiotics, IV diuresis, systemic steroids and bronchodilator therapy. He was extubated on 2/25, has made persistent progress  since that point. He does however get significantly hypoxic with exertion off oxygen. He ambulated ~100 feet and his oxygen sats dropped to 59%. He denied being short of breath with this. He responded rapidly to adding back oxygen at 4 liters.  Plan Home on oxygen 4 liters, increase 5 liters with activity -home on lasix bid -Continue BD'ers  -cont avelox (will complete 10 d course) -wean steroids    CHF exacerbation/decompensated heart failure:  - not candidate for ACEI   Lab  02/23/12 0120  02/22/12 1701  02/22/12 0700   CKTOTAL  69  80  84   CKMB  2.9  3.7  3.6   TROPONINI  <0.30  <0.30  <0.30     Lab  02/27/12 0332  02/22/12 0230   PROBNP  152.3*  1675.0*   plan:  -cont catapres  -cont asa  -cont lasix  -added cardio-selective b-blocker 2/27  -cards to see for out-pt CHF clinic establishment (appointment made)  Macrocytosis  etoh in history  Plan:  -cont thiamine, folic, MVI   Acute encephalopathy: now improved, but has ETOH history no evidence of difficulty at this point.  Plan:  -thiamine folate and PRN benzo    Discharge Exam: Patient Vitals for the past 3 hrs:  BP Temp Temp src Pulse Resp SpO2  02/27/12 0900 141/70 mmHg 99.2 F (37.3 C) Oral 71  18  96 %  4 liters Neuro: Awake/alert and oriented  HEENT: oropharnyx clear  Heart: RRR, no M/R/G  Lungs: Bilateral ronchi, no accessory muscle use, exp wheeze after cough  Abdomen: Soft, non tender, bowel sounds present  Extremities: No cyanosis, clubbing or edema.  Labs at discharge Lab Results  Component Value Date   CREATININE 1.21 02/27/2012  BUN 34* 02/27/2012   NA 142 02/27/2012   K 3.7 02/27/2012   CL 91* 02/27/2012   CO2 >45* 02/27/2012   Lab Results  Component Value Date   WBC 9.2 02/27/2012   HGB 15.6 02/27/2012   HCT 50.9 02/27/2012   MCV 108.3* 02/27/2012   PLT 232 02/27/2012   Lab Results  Component Value Date   ALT 9 02/23/2012   AST 13 02/23/2012   ALKPHOS 74 02/23/2012   BILITOT 0.5 02/23/2012    No results found for this basename: INR, PROTIME    Current radiology studies No results found.  Disposition:    Discharge Orders    Future Appointments: Provider: Department: Dept Phone: Center:   03/02/2012 3:30 PM Rubye Oaks, NP [facility] [phone] None   03/03/2012 1:15 PM [facility] [phone] None     Future Orders Please Complete By Expires   For home use only DME oxygen      Diet - low sodium heart healthy      Increase activity slowly      Call MD for:  temperature >100.4      Call MD for:  severe uncontrolled pain      Call MD for:  difficulty breathing, headache or visual disturbances      Call MD for:  extreme fatigue      (HEART FAILURE PATIENTS) Call MD:  Anytime you have any of the following symptoms: 1) 3 pound weight gain in 24 hours or 5 pounds in 1 week 2) shortness of breath, with or without a dry hacking cough 3) swelling in the hands, feet or stomach 4) if you have to sleep on extra pillows at night in order to breathe.      Discharge instructions      Comments:   Use oxygen at ALL TIMES 4 liters at rest, increase to 5 with activity  Absolutely no smoking!     Medication List  As of 02/27/2012 11:41 AM   STOP taking these medications         amLODipine 10 MG tablet      carvedilol 6.25 MG tablet      colchicine 0.6 MG tablet      isosorbide-hydrALAZINE 20-37.5 MG per tablet      ketoprofen 50 MG capsule         TAKE these medications         albuterol 108 (90 BASE) MCG/ACT inhaler   Commonly known as: PROVENTIL HFA;VENTOLIN HFA   Inhale 2 puffs into the lungs every 4 (four) hours as needed for wheezing.      albuterol-ipratropium 18-103 MCG/ACT inhaler   Commonly known as: COMBIVENT   Inhale 2 puffs into the lungs 4 (four) times daily.      aspirin EC 325 MG tablet   Take 325 mg by mouth daily.      bisoprolol 5 MG tablet   Commonly known as: ZEBETA   Take 1 tablet (5 mg total) by mouth daily.       cloNIDine 0.1 MG tablet   Commonly known as: CATAPRES   Take 1 tablet (0.1 mg total) by mouth 2 (two) times daily.      fenofibrate 48 MG tablet   Commonly known as: TRICOR   Take 48 mg by mouth daily.      folic acid 1 MG tablet   Commonly known as: FOLVITE   Take 1 tablet (1 mg total) by mouth daily.  furosemide 40 MG tablet   Commonly known as: LASIX   Take 1 tablet (40 mg total) by mouth 2 (two) times daily.      moxifloxacin 400 MG tablet   Commonly known as: AVELOX   Take 1 tablet (400 mg total) by mouth daily at 6 PM.      pantoprazole 40 MG tablet   Commonly known as: PROTONIX   Take 1 tablet (40 mg total) by mouth 2 (two) times daily before a meal.      predniSONE 10 MG tablet   Commonly known as: DELTASONE   Take 4 tabs for 3 days, then 3 tabs for 3 days, then 2 tabs for 3 days, then 1 tab for three days and stop      thiamine 100 MG tablet   Take 1 tablet (100 mg total) by mouth daily.           Follow-up Information    Follow up with Arvilla Meres, MD on 03/03/2012. (at 1:15p   [employer] Code 6000))    Contact information:   [facility] [address] [place] [address] [phone]       Follow up with [facility], NP on 03/02/2012. (at 330 )    Contact information:   [employer] [address] [place] [address] [phone]       Follow up with [facility] . (in next 2 weeks when you can make an appointment )          Discharged Condition: improved, can walk about 100 feet before onset of dyspnea on 4lpm  Signed: BABCOCK,PETE 02/27/2012, 11:41 AM  Pt independently  seen and examined and available cxr's reviewed and I agree with above findings/ imp/ plan  Sandrea Hughs, MD Pulmonary and Critical Care Medicine [facility] Cell [phone]

## 2012-02-28 LAB — GLUCOSE, CAPILLARY
Glucose-Capillary: 137 mg/dL — ABNORMAL HIGH (ref 70–99)
Glucose-Capillary: 152 mg/dL — ABNORMAL HIGH (ref 70–99)
Glucose-Capillary: 85 mg/dL (ref 70–99)

## 2012-02-29 LAB — CULTURE, BLOOD (ROUTINE X 2)
Culture  Setup Time: 201302241641
Culture: NO GROWTH

## 2012-03-02 ENCOUNTER — Inpatient Hospital Stay: Payer: Medicare Other | Admitting: Adult Health

## 2012-03-03 ENCOUNTER — Inpatient Hospital Stay (HOSPITAL_COMMUNITY): Admit: 2012-03-03 | Payer: Medicare Other

## 2012-03-18 ENCOUNTER — Encounter (HOSPITAL_COMMUNITY): Payer: Self-pay

## 2012-03-18 ENCOUNTER — Ambulatory Visit (HOSPITAL_COMMUNITY)
Admission: RE | Admit: 2012-03-18 | Discharge: 2012-03-18 | Disposition: A | Discharge status: Home / Self Care | Payer: Medicare Other | Source: Ambulatory Visit | Attending: Internal Medicine | Admitting: Internal Medicine

## 2012-03-18 VITALS — BP 162/78 | HR 76 | Wt 198.2 lb

## 2012-03-18 DIAGNOSIS — I519 Heart disease, unspecified: Secondary | ICD-10-CM

## 2012-03-18 DIAGNOSIS — R931 Abnormal findings on diagnostic imaging of heart and coronary circulation: Secondary | ICD-10-CM | POA: Insufficient documentation

## 2012-03-18 DIAGNOSIS — I272 Pulmonary hypertension, unspecified: Secondary | ICD-10-CM | POA: Insufficient documentation

## 2012-03-18 DIAGNOSIS — I2789 Other specified pulmonary heart diseases: Secondary | ICD-10-CM

## 2012-03-18 DIAGNOSIS — I2510 Atherosclerotic heart disease of native coronary artery without angina pectoris: Secondary | ICD-10-CM

## 2012-03-18 DIAGNOSIS — R9389 Abnormal findings on diagnostic imaging of other specified body structures: Secondary | ICD-10-CM

## 2012-03-18 DIAGNOSIS — I10 Essential (primary) hypertension: Secondary | ICD-10-CM

## 2012-03-18 MED ORDER — SPIRONOLACTONE 25 MG PO TABS: 12.5000 mg | ORAL_TABLET | Freq: Every day | ORAL | Status: DC

## 2012-03-18 NOTE — Patient Instructions (Signed)
Start Spironolactone 25 mg 1/2 tab daily  Will have Home Health draw labs next week  You have been referred to Pulmonary for Sleep Eval

## 2012-03-18 NOTE — Assessment & Plan Note (Signed)
Moderate CAD in 2006. Suspect it is probably worse now but no angina sx. He is not CABG candidate. So no role for cath at this point.

## 2012-03-18 NOTE — Assessment & Plan Note (Signed)
Resolved. Currently on bystolic. Not candidate for ACE-I/ARB due to angioedema.

## 2012-03-18 NOTE — Progress Notes (Signed)
Patient ID: LORENA CLEARMAN, male   DOB: 06-Dec-1946, 66 y.o.   MRN: [id number]  S/O:  Mr. Sheils was seen in clinic for CHF management.  He can walk a quarter mile without shortness of breath, can lay flat and reports no lower extremity swelling but recent resolution of right wrist swelling treated with sports balm.  He reports taking 1, 40 mg lasix tablet twice daily and feels well.    Current weight 198 pounds, Estimated Dry weight 195 pounds  A/P: Provided Mr. Whitmire and his daughter, who lives with him with her children a scale and education regarding the following Lasix sliding scale: Weight Adjusted Fluid Pill Dosing Fluid Pill: 40 mg PO BID Goal Weight:  195 pounds  Weight Number of Pills per dose Dose  Normal Dose Above 190 pounds Below  200 pounds 1 am 1 pm 40 mg PO Twice daily  Weight UP      Above 200 pounds 2 am 1 pm 80 mg PO in the am 40 mg PO in the pm  Weight DOWN     Below 192 pounds 1 am 40 mg in the am   CALL  [phone] if weight UP or DOWN by  3 pounds in 1 day or 5 pounds in 1 week, if over 200 pounds for more than 3 days or if below 192 pounds   Mr. Harold verbalized understanding and  Explained appropriate actions for a week of hypothetical weight adjustment ( 198, 199, 199, 205, 201, 199, and a weight of 190 pounds).  Provided HF education book, emphasizing signs of worsening HF and importance of low sodium diet.  Mr. Mittag and his daughter eat take out most nights of the week, specifically enjoying [address], Chilli cheese fries, and hamburgers.  Encouraged he and his daughter to evaluate current diet to look for ways to limit sodium.  He was reluctant to agree to this stating "you are really hurting me now"  He expressed concern that his food budget was often stretched by feeding his daughters family, but agreed that he could afford to make healthier choices.  Educated his daughter to consider importance of lowering families sodium intake in order to keep her father  around longer, she was reluctant to agree.  Provided Mr. Nakama with 4 weight grids and requested he complete them daily and bring them with him on his next follow up appointment.  Anticipate marginal compliance, limited largely by dietary indiscretions.  Lovenia Kim Pharm.D., BCPS Clinical Pharmacist 03/18/2012 10:51 AM Pager: [phone] Phone: [phone]

## 2012-03-18 NOTE — Assessment & Plan Note (Signed)
Echo shows RV dysfunction. 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 He does not have symptoms of advanced RV failure. At this point, I do not think there is a role for RHC as it would not change what we will do. Have stressed need for maintaining adequate oxygenation at all times. Have prescribed him a portable tank. Will all order sleep study. Will f/u with Dr. Sherene Sires. Extensive discussion regarding need for daily weights and reviewed use of sliding scale diuretics. We gave him a scale in clinic.

## 2012-03-18 NOTE — Assessment & Plan Note (Signed)
BP high. Will add spiro 12.5 to help with BP and volume.

## 2012-03-18 NOTE — Progress Notes (Signed)
Patient ambulated in hallway, O2 sat was 82% he was placed on 2 L O2 and sat came up to 93%

## 2012-03-18 NOTE — Progress Notes (Signed)
Referring: Dr. 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  Previously seen by [facility] but has not followed up since 2006. Had cath in 2006. Which showed moderate non-obstructive CAD with EF 35-40%.  RA 8 RV 42/8 PA 42/19 (29) PCWP 13 O2 saturation in the pulmonary artery was 56% and in the aorta was 93%.  Cardiac output by the thermodilution method was 5.8 and by the Fick method  was 3.5 liters per minute. Cardiac index was 3.0 and 1.8 liters per minute  per meter squared,  LAD: 60%prox, 40-50%mid LCX: 40% prox.60-70% mid OM-1 50% RCA: Diffuse narrowing of 70% tapering to 50% in the proximal to mid-segment  and then was 40% narrowing in the region just proximal to the crux.  Recently admitted in February 2013 with respiratory failure due to COPD flare was on ventilator. Echo with EF 50-55% with moderately dilated RV with mod-severe RV dysfunction. No TR jet to estimate PAP.  Referred to HF clinic for f/u. Says he is feeling better. Breathing much better. Not wearing O2 today because he doesn't have a portable tank. Occasional LE edema. No orthopnea, PND. No CP. Quit smoking. Doesn't have scale at home. No ETOH. (was drinking 3-4 pints wine and 4-6 quarts beer/day - just stopped a few months ago). Can walk 1/4 mile with too much dyspnea. No syncope. Daughter says he snores heavily. Weight on d/c was 184 and 198 here but he says he thinks weight is still down. Has problems with gout. Taking ibuprofen 3-4x/week.   Had angioedema with ACE-I.  Review of Systems:     Cardiac Review of Systems: {Y] = yes [ ]  = no  Chest Pain [    ]  Resting SOB [   ] Exertional SOB  [name]  ]  Orthopnea [  ]   Pedal Edema [   ]    Palpitations [  ] Syncope  [  ]   Presyncope [   ]  General Review of Systems: [Y] = yes [  ]=no Constitional: recent weight change [  ]; anorexia [  ]; fatigue [   ]; nausea [  ]; night sweats [  ]; fever [  ]; or chills [  ];                                                                                                                                          Dental: poor dentition[  ];   Eye : blurred vision [  ]; diplopia [   ]; vision changes [  ];  Amaurosis fugax[  ]; Resp: cough [  ];  wheezing[  ];  hemoptysis[  ]; shortness of breath[y  ]; paroxysmal nocturnal dyspnea[  ]; dyspnea on exertion[y  ]; or  orthopnea[  ];  GI:  gallstones[  ], vomiting[  ];  dysphagia[  ]; melena[  ];  hematochezia [  ]; heartburn[  ];   Hx of  Colonoscopy[  ]; GU: kidney stones [  ]; hematuria[  ];   dysuria [  ];  nocturia[  ];  history of     obstruction [  ];                 Skin: rash, swelling[  ];, hair loss[  ];  peripheral edema[y  ];  or itching[  ]; Musculosketetal: myalgias[  ];  joint swelling[ y ];  joint erythema[  ];  joint pain[y  ];  back pain[  ];  Heme/Lymph: bruising[  ];  bleeding[  ];  anemia[  ];  Neuro: TIA[  ];  headaches[  ];  stroke[  ];  vertigo[  ];  seizures[  ];   paresthesias[  ];  difficulty walking[  ];  Psych:depression[  ]; anxiety[  ];  Endocrine: diabetes[y  ];  thyroid dysfunction[  ];  Immunizations: Flu [  ]; Pneumococcal[  ];  Other:    Past Medical History  Diagnosis Date  . CHF (congestive heart failure)   . MI (myocardial infarction)   . COPD (chronic obstructive pulmonary disease)   . Diabetes mellitus   . Hypertension   . Gout   . Hyperlipemia     Current Outpatient Prescriptions  Medication Sig Dispense Refill  . albuterol (VENTOLIN HFA) 108 (90 BASE) MCG/ACT inhaler Inhale 2 puffs into the lungs every 4 (four) hours as needed for wheezing.  1 Inhaler  6  . albuterol-ipratropium (COMBIVENT) 18-103 MCG/ACT inhaler Inhale 2 puffs into the lungs 4 (four) times daily.  1 Inhaler  6  . allopurinol (ZYLOPRIM) 100 MG tablet Take 100 mg by mouth daily.      Marland Kitchen aspirin EC 325 MG tablet Take 325 mg by mouth daily.        . bisoprolol (ZEBETA) 5 MG tablet Take 1 tablet (5 mg total) by mouth daily.  30 tablet  6  . cloNIDine (CATAPRES) 0.1 MG tablet Take 1 tablet (0.1 mg total) by mouth 2 (two) times daily.  60 tablet  6  . fenofibrate (TRICOR) 48 MG tablet Take 48 mg by mouth daily.      . Fluticasone-Salmeterol (ADVAIR) 500-50 MCG/DOSE AEPB Inhale 1 puff into the lungs every 12 (twelve) hours.      . folic acid (FOLVITE) 1 MG tablet Take 1 tablet (1 mg total) by mouth daily.  30 tablet  6  . furosemide (LASIX) 40 MG tablet Take 1 tablet (40 mg total) by mouth 2 (two) times daily.  60 tablet  0  . ibuprofen (ADVIL,MOTRIN) 200 MG tablet Take 200 mg by mouth every 6 (six) hours as needed.      . pantoprazole (PROTONIX) 40 MG tablet Take 1 tablet (40 mg total) by mouth 2 (two) times daily before a meal.  60 tablet  6  . predniSONE (DELTASONE) 10 MG tablet Take 4 tabs for 3 days, then 3 tabs for 3 days, then 2 tabs for 3 days, then 1 tab for three days and stop  30 tablet  0  . thiamine 100 MG tablet Take 1 tablet (100 mg total) by mouth daily.  30 tablet  6     Allergies  Allergen Reactions  . Lisinopril Swelling    History   Social History  . Marital  Status: Legally Separated    Spouse Name: N/A    Number of Children: N/A  . Years of Education: N/A   Occupational History  . Not on file.   Social History Main Topics  . Smoking status: Former Smoker    Quit date: 02/22/2012  . Smokeless tobacco: Not on file  . Alcohol Use: Yes     1/5 or pint per day  . Drug Use: Not on file  . Sexually Active: Not on file   Other Topics Concern  . Not on file   Social History Narrative  . No narrative on file    No family history on file.  PHYSICAL EXAM: Filed Vitals:   03/18/12 0916  BP: 162/78  Pulse: 76  Sats 82% with ambulation (no O2) Low 90s at rest General:  Well appearing. No respiratory difficulty HEENT: normal Neck: supple. no JVD. Carotids 2+ bilat; no bruits. No lymphadenopathy or  thryomegaly appreciated. Cor: PMI nondisplaced. Regular rate & rhythm. No rubs, gallops or murmurs.S2 mildly increased no RV lift Lungs: clear Abdomen: obese. soft, nontender, nondistended. No hepatosplenomegaly. No bruits or masses. Good bowel sounds. Extremities: no cyanosis, clubbing, rash, edema Neuro: alert & oriented x 3, cranial nerves grossly intact. moves all 4 extremities w/o difficulty. Affect pleasant.    No results found for this or any previous visit (from the past 24 hour(s)). No results found.   ASSESSMENT & PLAN:

## 2012-03-30 ENCOUNTER — Telehealth (HOSPITAL_COMMUNITY): Payer: Self-pay | Admitting: *Deleted

## 2012-03-30 NOTE — Telephone Encounter (Signed)
Spoke w/pt's advised rx for Sprio was sent in on 3/20 at his appt, have faxed another prescription to them at [phone]

## 2012-03-30 NOTE — Telephone Encounter (Signed)
Ms Fedewa called today regarding her dad.  He was here on the 20th of March, and was suppose to have some meds called into the pharmacy, but she said that they have not been called in.  Can you please check on this and call her back.  Thanks.

## 2012-04-08 ENCOUNTER — Institutional Professional Consult (permissible substitution): Payer: Medicare Other | Admitting: Pulmonary Disease

## 2012-04-16 ENCOUNTER — Ambulatory Visit: Payer: Medicare Other | Admitting: Internal Medicine

## 2012-04-16 DIAGNOSIS — Z0289 Encounter for other administrative examinations: Secondary | ICD-10-CM

## 2012-11-28 ENCOUNTER — Encounter (HOSPITAL_COMMUNITY): Payer: Self-pay | Admitting: Emergency Medicine

## 2012-11-28 ENCOUNTER — Emergency Department (HOSPITAL_COMMUNITY): Payer: Medicare Other

## 2012-11-28 ENCOUNTER — Inpatient Hospital Stay (HOSPITAL_COMMUNITY)
Admission: EM | Admit: 2012-11-28 | Discharge: 2012-11-30 | DRG: 192 | Disposition: A | Discharge status: Home Health | Payer: Medicare Other | Attending: Internal Medicine | Admitting: Internal Medicine

## 2012-11-28 DIAGNOSIS — I5023 Acute on chronic systolic (congestive) heart failure: Secondary | ICD-10-CM

## 2012-11-28 DIAGNOSIS — I129 Hypertensive chronic kidney disease with stage 1 through stage 4 chronic kidney disease, or unspecified chronic kidney disease: Secondary | ICD-10-CM | POA: Diagnosis present

## 2012-11-28 DIAGNOSIS — J441 Chronic obstructive pulmonary disease with (acute) exacerbation: Principal | ICD-10-CM | POA: Diagnosis present

## 2012-11-28 DIAGNOSIS — Z720 Tobacco use: Secondary | ICD-10-CM

## 2012-11-28 DIAGNOSIS — Z79899 Other long term (current) drug therapy: Secondary | ICD-10-CM

## 2012-11-28 DIAGNOSIS — I252 Old myocardial infarction: Secondary | ICD-10-CM

## 2012-11-28 DIAGNOSIS — M109 Gout, unspecified: Secondary | ICD-10-CM | POA: Diagnosis present

## 2012-11-28 DIAGNOSIS — I2510 Atherosclerotic heart disease of native coronary artery without angina pectoris: Secondary | ICD-10-CM | POA: Diagnosis present

## 2012-11-28 DIAGNOSIS — I509 Heart failure, unspecified: Secondary | ICD-10-CM

## 2012-11-28 DIAGNOSIS — F17200 Nicotine dependence, unspecified, uncomplicated: Secondary | ICD-10-CM | POA: Diagnosis present

## 2012-11-28 DIAGNOSIS — E785 Hyperlipidemia, unspecified: Secondary | ICD-10-CM | POA: Diagnosis present

## 2012-11-28 DIAGNOSIS — I10 Essential (primary) hypertension: Secondary | ICD-10-CM | POA: Diagnosis present

## 2012-11-28 DIAGNOSIS — N189 Chronic kidney disease, unspecified: Secondary | ICD-10-CM | POA: Diagnosis present

## 2012-11-28 DIAGNOSIS — Z9981 Dependence on supplemental oxygen: Secondary | ICD-10-CM

## 2012-11-28 DIAGNOSIS — R931 Abnormal findings on diagnostic imaging of heart and coronary circulation: Secondary | ICD-10-CM

## 2012-11-28 DIAGNOSIS — E119 Type 2 diabetes mellitus without complications: Secondary | ICD-10-CM | POA: Diagnosis present

## 2012-11-28 DIAGNOSIS — I519 Heart disease, unspecified: Secondary | ICD-10-CM

## 2012-11-28 DIAGNOSIS — I272 Pulmonary hypertension, unspecified: Secondary | ICD-10-CM

## 2012-11-28 LAB — CBC WITH DIFFERENTIAL/PLATELET
Basophils Absolute: 0 10*3/uL (ref 0.0–0.1)
Basophils Absolute: 0 10*3/uL (ref 0.0–0.1)
Eosinophils Absolute: 0.1 10*3/uL (ref 0.0–0.7)
Eosinophils Relative: 1 % (ref 0–5)
Eosinophils Relative: 1 % (ref 0–5)
Lymphocytes Relative: 3 % — ABNORMAL LOW (ref 12–46)
Lymphs Abs: 0.5 10*3/uL — ABNORMAL LOW (ref 0.7–4.0)
MCH: 32.9 pg (ref 26.0–34.0)
MCV: 99 fL (ref 78.0–100.0)
Neutro Abs: 13.9 10*3/uL — ABNORMAL HIGH (ref 1.7–7.7)
Neutrophils Relative %: 93 % — ABNORMAL HIGH (ref 43–77)
Neutrophils Relative %: 93 % — ABNORMAL HIGH (ref 43–77)
Platelets: 188 10*3/uL (ref 150–400)
Platelets: 173 10*3/uL (ref 150–400)
RBC: 4.21 MIL/uL — ABNORMAL LOW (ref 4.22–5.81)
RBC: 4.1 MIL/uL — ABNORMAL LOW (ref 4.22–5.81)
RDW: 13 % (ref 11.5–15.5)
RDW: 13 % (ref 11.5–15.5)
WBC: 14.9 10*3/uL — ABNORMAL HIGH (ref 4.0–10.5)
WBC: 17.4 10*3/uL — ABNORMAL HIGH (ref 4.0–10.5)

## 2012-11-28 LAB — COMPREHENSIVE METABOLIC PANEL
ALT: 14 U/L (ref 0–53)
AST: 24 U/L (ref 0–37)
Albumin: 4 g/dL (ref 3.5–5.2)
Alkaline Phosphatase: 85 U/L (ref 39–117)
Calcium: 9.4 mg/dL (ref 8.4–10.5)
Potassium: 4.3 mEq/L (ref 3.5–5.1)
Sodium: 138 mEq/L (ref 135–145)
Total Protein: 7.7 g/dL (ref 6.0–8.3)

## 2012-11-28 LAB — BASIC METABOLIC PANEL
CO2: 31 mEq/L (ref 19–32)
Calcium: 9.8 mg/dL (ref 8.4–10.5)
GFR calc non Af Amer: 57 mL/min — ABNORMAL LOW (ref >90)
Glucose, Bld: 130 mg/dL — ABNORMAL HIGH (ref 70–99)
Potassium: 4.4 mEq/L (ref 3.5–5.1)
Sodium: 137 mEq/L (ref 135–145)

## 2012-11-28 LAB — PRO B NATRIURETIC PEPTIDE: Pro B Natriuretic peptide (BNP): 90.9 pg/mL (ref 0–125)

## 2012-11-28 LAB — D-DIMER, QUANTITATIVE: D-Dimer, Quant: 0.44 ug/mL-FEU (ref 0.00–0.48)

## 2012-11-28 LAB — POCT I-STAT TROPONIN I: Troponin i, poc: 0 ng/mL (ref 0.00–0.08)

## 2012-11-28 MED ORDER — SODIUM CHLORIDE 0.9 % IJ SOLN
3.0000 mL | Freq: Two times a day (BID) | INTRAMUSCULAR | Status: DC
  Administered 2012-11-29 – 2012-11-30 (×2): 3 mL via INTRAVENOUS

## 2012-11-28 MED ORDER — IPRATROPIUM BROMIDE 0.02 % IN SOLN
0.5000 mg | RESPIRATORY_TRACT | Status: DC
  Administered 2012-11-28 – 2012-11-29 (×4): 0.5 mg via RESPIRATORY_TRACT
  Filled 2012-11-28 (×4): qty 2.5

## 2012-11-28 MED ORDER — METHYLPREDNISOLONE SODIUM SUCC 125 MG IJ SOLR
125.0000 mg | Freq: Once | INTRAMUSCULAR | Status: AC
  Administered 2012-11-28: 125 mg via INTRAVENOUS
  Filled 2012-11-28 (×2): qty 2

## 2012-11-28 MED ORDER — INDOMETHACIN 25 MG PO CAPS
50.0000 mg | ORAL_CAPSULE | Freq: Two times a day (BID) | ORAL | Status: DC
  Administered 2012-11-29 – 2012-11-30 (×3): 50 mg via ORAL
  Filled 2012-11-28: qty 1
  Filled 2012-11-28 (×4): qty 2

## 2012-11-28 MED ORDER — ACETAMINOPHEN 650 MG RE SUPP: 650.0000 mg | Freq: Four times a day (QID) | RECTAL | Status: DC | PRN

## 2012-11-28 MED ORDER — DEXTROSE 50 % IV SOLN
INTRAVENOUS | Status: AC
  Filled 2012-11-28: qty 50

## 2012-11-28 MED ORDER — SPIRONOLACTONE 12.5 MG HALF TABLET
12.5000 mg | ORAL_TABLET | Freq: Every day | ORAL | Status: DC
  Administered 2012-11-29 – 2012-11-30 (×2): 12.5 mg via ORAL
  Filled 2012-11-28 (×2): qty 1

## 2012-11-28 MED ORDER — ACETAMINOPHEN 325 MG PO TABS: 650.0000 mg | ORAL_TABLET | Freq: Four times a day (QID) | ORAL | Status: DC | PRN

## 2012-11-28 MED ORDER — CARVEDILOL 6.25 MG PO TABS
6.2500 mg | ORAL_TABLET | Freq: Two times a day (BID) | ORAL | Status: DC
Start: 2012-11-29 — End: 2012-11-30
  Administered 2012-11-29 – 2012-11-30 (×3): 6.25 mg via ORAL
  Filled 2012-11-28 (×5): qty 1

## 2012-11-28 MED ORDER — ENOXAPARIN SODIUM 40 MG/0.4ML ~~LOC~~ SOLN
40.0000 mg | SUBCUTANEOUS | Status: DC
  Administered 2012-11-28 – 2012-11-29 (×2): 40 mg via SUBCUTANEOUS
  Filled 2012-11-28 (×3): qty 0.4

## 2012-11-28 MED ORDER — LEVOFLOXACIN IN D5W 750 MG/150ML IV SOLN
750.0000 mg | INTRAVENOUS | Status: DC
  Administered 2012-11-28: 750 mg via INTRAVENOUS

## 2012-11-28 MED ORDER — AMLODIPINE BESYLATE 10 MG PO TABS
10.0000 mg | ORAL_TABLET | Freq: Every day | ORAL | Status: DC
  Administered 2012-11-29 – 2012-11-30 (×2): 10 mg via ORAL
  Filled 2012-11-28 (×2): qty 1

## 2012-11-28 MED ORDER — FENOFIBRATE 54 MG PO TABS
54.0000 mg | ORAL_TABLET | Freq: Every day | ORAL | Status: DC
  Administered 2012-11-29 – 2012-11-30 (×2): 54 mg via ORAL
  Filled 2012-11-28 (×2): qty 1

## 2012-11-28 MED ORDER — ONDANSETRON HCL 4 MG/2ML IJ SOLN: 4.0000 mg | Freq: Four times a day (QID) | INTRAMUSCULAR | Status: DC | PRN

## 2012-11-28 MED ORDER — MOMETASONE FURO-FORMOTEROL FUM 200-5 MCG/ACT IN AERO
2.0000 | INHALATION_SPRAY | Freq: Two times a day (BID) | RESPIRATORY_TRACT | Status: DC
  Administered 2012-11-28 – 2012-11-30 (×3): 2 via RESPIRATORY_TRACT
  Filled 2012-11-28 (×2): qty 8.8

## 2012-11-28 MED ORDER — METHYLPREDNISOLONE SODIUM SUCC 125 MG IJ SOLR
60.0000 mg | Freq: Four times a day (QID) | INTRAMUSCULAR | Status: DC
  Administered 2012-11-28 – 2012-11-29 (×3): 60 mg via INTRAVENOUS
  Filled 2012-11-28 (×7): qty 0.96

## 2012-11-28 MED ORDER — INSULIN ASPART 100 UNIT/ML ~~LOC~~ SOLN: 0.0000 [IU] | Freq: Every day | SUBCUTANEOUS | Status: DC

## 2012-11-28 MED ORDER — ALBUTEROL SULFATE HFA 108 (90 BASE) MCG/ACT IN AERS
2.0000 | INHALATION_SPRAY | RESPIRATORY_TRACT | Status: DC | PRN
  Filled 2012-11-28: qty 6.7

## 2012-11-28 MED ORDER — FUROSEMIDE 20 MG PO TABS
20.0000 mg | ORAL_TABLET | Freq: Every day | ORAL | Status: DC
  Administered 2012-11-29 – 2012-11-30 (×2): 20 mg via ORAL
  Filled 2012-11-28 (×2): qty 1

## 2012-11-28 MED ORDER — SODIUM CHLORIDE 0.9 % IV SOLN: 250.0000 mL | INTRAVENOUS | Status: DC | PRN

## 2012-11-28 MED ORDER — COLCHICINE 0.6 MG PO TABS
0.6000 mg | ORAL_TABLET | Freq: Every day | ORAL | Status: DC
  Administered 2012-11-29 – 2012-11-30 (×2): 0.6 mg via ORAL
  Filled 2012-11-28 (×2): qty 1

## 2012-11-28 MED ORDER — ONDANSETRON HCL 4 MG PO TABS: 4.0000 mg | ORAL_TABLET | Freq: Four times a day (QID) | ORAL | Status: DC | PRN

## 2012-11-28 MED ORDER — ISOSORB DINITRATE-HYDRALAZINE 20-37.5 MG PO TABS
1.0000 | ORAL_TABLET | Freq: Three times a day (TID) | ORAL | Status: DC
  Administered 2012-11-28 – 2012-11-30 (×4): 1 via ORAL
  Filled 2012-11-28 (×8): qty 1

## 2012-11-28 MED ORDER — SODIUM CHLORIDE 0.9 % IJ SOLN
3.0000 mL | Freq: Two times a day (BID) | INTRAMUSCULAR | Status: DC
  Administered 2012-11-29: 3 mL via INTRAVENOUS

## 2012-11-28 MED ORDER — ALBUTEROL SULFATE (5 MG/ML) 0.5% IN NEBU
2.5000 mg | INHALATION_SOLUTION | RESPIRATORY_TRACT | Status: DC
  Administered 2012-11-28 – 2012-11-29 (×4): 2.5 mg via RESPIRATORY_TRACT
  Filled 2012-11-28 (×2): qty 0.5
  Filled 2012-11-28: qty 20
  Filled 2012-11-28: qty 0.5

## 2012-11-28 MED ORDER — SODIUM CHLORIDE 0.9 % IJ SOLN: 3.0000 mL | INTRAMUSCULAR | Status: DC | PRN

## 2012-11-28 MED ORDER — ASPIRIN 81 MG PO CHEW
81.0000 mg | CHEWABLE_TABLET | Freq: Every day | ORAL | Status: DC
  Administered 2012-11-29 – 2012-11-30 (×2): 81 mg via ORAL
  Filled 2012-11-28 (×2): qty 1

## 2012-11-28 MED ORDER — LEVOFLOXACIN IN D5W 750 MG/150ML IV SOLN
750.0000 mg | INTRAVENOUS | Status: DC
  Administered 2012-11-29: 750 mg via INTRAVENOUS
  Filled 2012-11-28 (×2): qty 150

## 2012-11-28 MED ORDER — INSULIN ASPART 100 UNIT/ML ~~LOC~~ SOLN
0.0000 [IU] | Freq: Three times a day (TID) | SUBCUTANEOUS | Status: DC
  Administered 2012-11-29: 3 [IU] via SUBCUTANEOUS

## 2012-11-28 NOTE — ED Notes (Signed)
Rapid Response  at bedside per admission RN request

## 2012-11-28 NOTE — ED Notes (Signed)
Pt c/o increased SOB starting today; pt shaking and only speaking one word answers; pt sts cough and pain with cough

## 2012-11-28 NOTE — H&P (Addendum)
[facility] History and Physical  John [name] ZOX:[id number] DOB: 10/18/1946 DOA: 11/28/2012  Referring physician: er PCP: No primary provider on file.  Specialists: none  Chief Complaint: SOB  HPI: John [name] is a 66 y.o. male  AA male who was admitted in Febuary of 2013 with a similar episode of COPD ex.  He comes in to the ER today c/o SOB and the inability to lay flat.  He is on home O2 that he says he only wears at night (2L) and PRN during the day.  He used to go to [facility] for his medical care.  Does not currently have a PCP.  He continues to smoke- he says occasionally, the neighbor says continuously.  Yesterday his breathing worsened.  He denies fever and chill, CP.    In the ER he was found to have an elevated WBC count as well as hypoxia- he de-satted to the 80s on room air.  He also describes the inability to lay flat.  He was given nebs, solumedrol in the ER.   Review of Systems: all systems reviewed, negative unless stated above  Past Medical History  Diagnosis Date  . CHF (congestive heart failure)   . MI (myocardial infarction)   . COPD (chronic obstructive pulmonary disease)   . Diabetes mellitus   . Hypertension   . Gout   . Hyperlipemia    Past Surgical History  Procedure Date  . Appendectomy    Social History:  reports that he quit smoking about 9 months ago. He does not have any smokeless tobacco history on file. He reports that he drinks alcohol. His drug history not on file. PATIENT STILL SMOKING DENIES alcohol  Allergies  Allergen Reactions  . Lisinopril Swelling    Family Hx; +DM  Prior to Admission medications   Medication Sig Start Date End Date Taking? Authorizing Provider  albuterol (VENTOLIN HFA) 108 (90 BASE) MCG/ACT inhaler Inhale 2 puffs into the lungs every 4 (four) hours as needed for wheezing. 02/27/12 02/26/13 Yes Simonne Martinet, NP  amLODipine (NORVASC) 10 MG tablet Take 10 mg by mouth daily.   Yes Historical  Provider, MD  aspirin 81 MG chewable tablet Chew 81 mg by mouth daily.   Yes Historical Provider, MD  carvedilol (COREG) 6.25 MG tablet Take 6.25 mg by mouth 2 (two) times daily with a meal.   Yes Historical Provider, MD  colchicine 0.6 MG tablet Take 0.6 mg by mouth daily.   Yes Historical Provider, MD  fenofibrate (TRICOR) 48 MG tablet Take 48 mg by mouth daily.   Yes Historical Provider, MD  Fluticasone-Salmeterol (ADVAIR) 500-50 MCG/DOSE AEPB Inhale 1 puff into the lungs every 12 (twelve) hours.   Yes Historical Provider, MD  furosemide (LASIX) 20 MG tablet Take 20 mg by mouth daily.   Yes Historical Provider, MD  indomethacin (INDOCIN) 50 MG capsule Take 50 mg by mouth 2 (two) times daily with a meal.   Yes Historical Provider, MD  isosorbide-hydrALAZINE (BIDIL) 20-37.5 MG per tablet Take 1 tablet by mouth 3 (three) times daily.   Yes Historical Provider, MD  spironolactone (ALDACTONE) 25 MG tablet Take 0.5 tablets (12.5 mg total) by mouth daily. 03/18/12 03/18/13 Yes Dolores Patty, MD   Physical Exam: Filed Vitals:   11/28/12 1250  BP: 138/92  Pulse: 109  Temp: 98.1 F (36.7 C)  TempSrc: Oral  Resp: 30  SpO2: 91%     General:  A+Ox3, NAD, pleasant and cooperative  Eyes: wnl  ENT: wnl  Neck: no JVD when patient in upright position, unable to lay flat  Cardiovascular: rrr  Respiratory: dec BS, long expiratory phase, ronchi  Abdomen: +Bs, obese  Skin: no rashes or lesions  Musculoskeletal: moves all 4 extremities, equal strength, -c/c/e  Psychiatric: mood good  Neurologic: CN 2-12 grossly intact  Labs on Admission:  Basic Metabolic Panel:  Lab 11/28/12 1191 11/28/12 1305  NA 138 137  K 4.3 4.4  CL 100 98  CO2 29 31  GLUCOSE 132* 130*  BUN 17 16  CREATININE 1.20 1.26  CALCIUM 9.4 9.8  MG -- --  PHOS -- --   Liver Function Tests:  Lab 11/28/12 1415  AST 24  ALT 14  ALKPHOS 85  BILITOT 0.3  PROT 7.7  ALBUMIN 4.0   No results found for this  basename: LIPASE:5,AMYLASE:5 in the last 168 hours No results found for this basename: AMMONIA:5 in the last 168 hours CBC:  Lab 11/28/12 1415 11/28/12 1305  WBC 17.4* 14.9*  NEUTROABS 16.2* 13.9*  HGB 13.5 13.7  HCT 40.6 41.8  MCV 99.0 99.3  PLT 173 188   Cardiac Enzymes:  Lab 11/28/12 1415  CKTOTAL --  CKMB --  CKMBINDEX --  TROPONINI <0.30    BNP (last 3 results)  Basename 11/28/12 1305 02/27/12 0332 02/22/12 0230  PROBNP 90.9 152.3* 1675.0*   CBG: No results found for this basename: GLUCAP:5 in the last 168 hours  Radiological Exams on Admission: Dg Chest 2 View  11/28/2012  *RADIOLOGY REPORT*  Clinical Data: Short of breath.  Chest pain and cough  CHEST - 2 VIEW  Comparison: 02/25/2012  Findings: COPD with hyperinflation.  Prominent lung markings. Negative for pneumonia or effusion.  Negative for heart failure or edema.  Thoracic disc degeneration and spondylosis diffusely.  No acute bony abnormality.  IMPRESSION: COPD.  No acute cardiopulmonary abnormality.   Original Report Authenticated By: Janeece Riggers, M.D.       Assessment/Plan Principal Problem:  *COPD exacerbation Active Problems:  DM (diabetes mellitus)  HTN (hypertension)  CAD (coronary artery disease)  Tobacco abuse   1. COPD exacerbation (acute)- does not appear to be heart failure as patient BNP was < 100.  He will be given steroids, abx, nebs and placed on O2 with sats 90-92%- no greater as patient has history of hypercapnea during last admission requiring ICU stay- will monitor closely with continuous pulse ox- low threshold to transfer to SDU 2. DM- SSI, check HgbA1c 3. Tobacco abuse- encourage cessation 4. HTN- home meds 5. CKD- seems to be at baseline 6. CAD- stable 7. Patient will need PCP- will ask case manager to see patient    Code Status: full Family Communication: patient and neighbor at bedside Disposition Plan: home 2-3 days  Time spent: 70 min  Benjamine Mola Rei Contee [facility] Pager [phone]  If 7PM-7AM, please contact night-coverage [email or web address] Password [email or web address] 11/28/2012, 4:31 PM

## 2012-11-28 NOTE — ED Notes (Signed)
Paged hospital ist at this time r/t blood cultures prior to antibiotic admisinstration

## 2012-11-28 NOTE — ED Provider Notes (Signed)
History     CSN: [id number]  Arrival date & time 11/28/12  1240   First MD Initiated Contact with Patient 11/28/12 1348      Chief Complaint  Patient presents with  . Shortness of Breath    (Consider location/radiation/quality/duration/timing/severity/associated sxs/prior treatment) HPI Comments: Patient with history of copd on home o2 at night and prn.  Presents with worsening breathing since last night when he "snuck a cigarette".  No fever or chills.  Reports cough that is non-productive.  Patient is a 66 y.o. male presenting with shortness of breath. The history is provided by the patient.  Shortness of Breath  The current episode started yesterday. The onset was sudden. The problem occurs continuously. The problem has been gradually worsening. The problem is moderate. Nothing relieves the symptoms. The symptoms are aggravated by activity and smoke exposure. Associated symptoms include shortness of breath.    Past Medical History  Diagnosis Date  . CHF (congestive heart failure)   . MI (myocardial infarction)   . COPD (chronic obstructive pulmonary disease)   . Diabetes mellitus   . Hypertension   . Gout   . Hyperlipemia     Past Surgical History  Procedure Date  . Appendectomy     History reviewed. No pertinent family history.  History  Substance Use Topics  . Smoking status: Former Smoker    Quit date: 02/22/2012  . Smokeless tobacco: Not on file  . Alcohol Use: Yes     Comment: 1/5 or pint per day      Review of Systems  Respiratory: Positive for shortness of breath.   All other systems reviewed and are negative.    Allergies  Lisinopril  Home Medications   Current Outpatient Rx  Name  Route  Sig  Dispense  Refill  . ALBUTEROL SULFATE HFA 108 (90 BASE) MCG/ACT IN AERS   Inhalation   Inhale 2 puffs into the lungs every 4 (four) hours as needed for wheezing.   1 Inhaler   6   . IPRATROPIUM-ALBUTEROL 18-103 MCG/ACT IN AERO   Inhalation  Inhale 2 puffs into the lungs 4 (four) times daily.   1 Inhaler   6   . ALLOPURINOL 100 MG PO TABS   Oral   Take 100 mg by mouth daily.         . ASPIRIN EC 325 MG PO TBEC   Oral   Take 325 mg by mouth daily.         Marland Kitchen BISOPROLOL FUMARATE 5 MG PO TABS   Oral   Take 1 tablet (5 mg total) by mouth daily.   30 tablet   6   . CLONIDINE HCL 0.1 MG PO TABS   Oral   Take 1 tablet (0.1 mg total) by mouth 2 (two) times daily.   60 tablet   6   . FENOFIBRATE 48 MG PO TABS   Oral   Take 48 mg by mouth daily.         Marland Kitchen FLUTICASONE-SALMETEROL 500-50 MCG/DOSE IN AEPB   Inhalation   Inhale 1 puff into the lungs every 12 (twelve) hours.         Marland Kitchen FOLIC ACID 1 MG PO TABS   Oral   Take 1 tablet (1 mg total) by mouth daily.   30 tablet   6   . FUROSEMIDE 40 MG PO TABS   Oral   Take 1 tablet (40 mg total) by mouth 2 (two) times daily.  60 tablet   0   . IBUPROFEN 200 MG PO TABS   Oral   Take 200 mg by mouth every 6 (six) hours as needed.         Marland Kitchen PANTOPRAZOLE SODIUM 40 MG PO TBEC   Oral   Take 1 tablet (40 mg total) by mouth 2 (two) times daily before a meal.   60 tablet   6   . PREDNISONE 10 MG PO TABS      Take 4 tabs for 3 days, then 3 tabs for 3 days, then 2 tabs for 3 days, then 1 tab for three days and stop   30 tablet   0   . SPIRONOLACTONE 25 MG PO TABS   Oral   Take 0.5 tablets (12.5 mg total) by mouth daily.   30 tablet   6   . THIAMINE HCL 100 MG PO TABS   Oral   Take 1 tablet (100 mg total) by mouth daily.   30 tablet   6     BP 138/92  Pulse 109  Temp 98.1 F (36.7 C) (Oral)  Resp 30  SpO2 91%  Physical Exam  Nursing note and vitals reviewed. Constitutional: He is oriented to person, place, and time. He appears well-developed and well-nourished. No distress.  HENT:  Head: Normocephalic and atraumatic.  Mouth/Throat: Oropharynx is clear and moist.  Neck: Normal range of motion. Neck supple.  Cardiovascular: Normal rate and  regular rhythm.   No murmur heard. Pulmonary/Chest:       The patient is tachypneic.  There are bilateral rhonchi present.    Abdominal: Soft. Bowel sounds are normal. He exhibits no distension. There is no tenderness.  Musculoskeletal: Normal range of motion. He exhibits no edema.  Neurological: He is alert and oriented to person, place, and time.  Skin: Skin is warm and dry. He is not diaphoretic.    ED Course  Procedures (including critical care time)  Labs Reviewed  CBC WITH DIFFERENTIAL - Abnormal; Notable for the following:    WBC 14.9 (*)     RBC 4.21 (*)     Neutrophils Relative 93 (*)     Neutro Abs 13.9 (*)     Lymphocytes Relative 3 (*)     Lymphs Abs 0.5 (*)     All other components within normal limits  BASIC METABOLIC PANEL - Abnormal; Notable for the following:    Glucose, Bld 130 (*)     GFR calc non Af Amer 57 (*)     GFR calc Af Amer 66 (*)     All other components within normal limits  PRO B NATRIURETIC PEPTIDE  D-DIMER, QUANTITATIVE  POCT I-STAT TROPONIN I  CBC WITH DIFFERENTIAL  COMPREHENSIVE METABOLIC PANEL  TROPONIN I   No results found.   No diagnosis found.   Date: 11/28/2012  Rate: 109  Rhythm: sinus tachycardia  QRS Axis: normal  Intervals: normal  ST/T Wave abnormalities: normal  Conduction Disutrbances:none  Narrative Interpretation:   Old EKG Reviewed: unchanged    MDM  The patient presents with shortness of breath, wheezing.  He has a history of copd and this is appears to be a flareup of this.  He was given nebs, solumedrol, but is continuing to saturate in the upper 80's when off oxygen.  I will consult medicine for admission.        Geoffery Lyons, MD 11/28/12 [phone]

## 2012-11-29 LAB — BASIC METABOLIC PANEL
CO2: 27 mEq/L (ref 19–32)
Calcium: 9.5 mg/dL (ref 8.4–10.5)
Creatinine, Ser: 1.05 mg/dL (ref 0.50–1.35)
GFR calc Af Amer: 83 mL/min — ABNORMAL LOW (ref >90)

## 2012-11-29 LAB — GLUCOSE, CAPILLARY
Glucose-Capillary: 120 mg/dL — ABNORMAL HIGH (ref 70–99)
Glucose-Capillary: 123 mg/dL — ABNORMAL HIGH (ref 70–99)
Glucose-Capillary: 114 mg/dL — ABNORMAL HIGH (ref 70–99)

## 2012-11-29 LAB — CBC
MCV: 98 fL (ref 78.0–100.0)
Platelets: 181 10*3/uL (ref 150–400)
RDW: 13 % (ref 11.5–15.5)
WBC: 12.7 10*3/uL — ABNORMAL HIGH (ref 4.0–10.5)

## 2012-11-29 MED ORDER — IPRATROPIUM BROMIDE 0.02 % IN SOLN
0.5000 mg | Freq: Four times a day (QID) | RESPIRATORY_TRACT | Status: DC
  Administered 2012-11-29 – 2012-11-30 (×5): 0.5 mg via RESPIRATORY_TRACT
  Filled 2012-11-29 (×4): qty 2.5

## 2012-11-29 MED ORDER — METHYLPREDNISOLONE SODIUM SUCC 40 MG IJ SOLR
40.0000 mg | Freq: Two times a day (BID) | INTRAMUSCULAR | Status: DC
  Administered 2012-11-29 – 2012-11-30 (×2): 40 mg via INTRAVENOUS
  Filled 2012-11-29 (×4): qty 1

## 2012-11-29 MED ORDER — ALBUTEROL SULFATE (5 MG/ML) 0.5% IN NEBU
2.5000 mg | INHALATION_SOLUTION | Freq: Four times a day (QID) | RESPIRATORY_TRACT | Status: DC
  Administered 2012-11-29 – 2012-11-30 (×6): 2.5 mg via RESPIRATORY_TRACT
  Filled 2012-11-29 (×5): qty 0.5

## 2012-11-29 MED ORDER — ALBUTEROL SULFATE (5 MG/ML) 0.5% IN NEBU: 2.5000 mg | INHALATION_SOLUTION | RESPIRATORY_TRACT | Status: DC

## 2012-11-29 MED ORDER — IPRATROPIUM BROMIDE 0.02 % IN SOLN: 0.5000 mg | Freq: Four times a day (QID) | RESPIRATORY_TRACT | Status: DC

## 2012-11-29 NOTE — Progress Notes (Signed)
[facility] PROGRESS NOTE  ABISAI DEER ZOX:[id number] DOB: 12/24/1946 DOA: 11/28/2012 PCP: No primary provider on file.  Assessment/Plan: 1. COPD exacerbation (acute)- does not appear to be heart failure as patient BNP was < 100. He will be given steroids- start to wean off- with D/C anticipated tomm with prednisone taper, abx, nebs and placed on O2 with sats 90-92%- no greater as patient has history of hypercapnea during last admission requiring ICU stay- 2. DM- SSI, check HgbA1c pending 3. Tobacco abuse- encourage cessation 4. HTN- home meds 5. CKD- seems to be at baseline 6. CAD- stable 7. Patient will need PCP- will ask case manager to see patient  Code Status: full Family Communication: patient at bedside Disposition Plan: home tomm (Monday)?    Consultants:  none  Procedures:  none  Antibiotics:  levaquin 11/30  HPI/Subjective: Patient feeling better, less SOB, no fever, no chills Plans to quit smoking  Objective: Filed Vitals:   11/28/12 2351 11/29/12 0316 11/29/12 0500 11/29/12 0756  BP:   120/77   Pulse:   77   Temp:   98 F (36.7 C)   TempSrc:   Oral   Resp:   22   Height:      Weight:   82.645 kg (182 lb 3.2 oz)   SpO2: 97% 95% 96% 96%    Intake/Output Summary (Last 24 hours) at 11/29/12 0847 Last data filed at 11/29/12 0800  Gross per 24 hour  Intake    700 ml  Output    775 ml  Net    -75 ml   Filed Weights   11/28/12 1834 11/29/12 0500  Weight: 83.144 kg (183 lb 4.8 oz) 82.645 kg (182 lb 3.2 oz)    Exam:   General:  A+Ox3, NAD  Cardiovascular: rrr  Respiratory: no wheezing, good air movement  Abdomen: +Bs, soft, NT/ND  Data Reviewed: Basic Metabolic Panel:  Lab 11/29/12 8119 11/28/12 1415 11/28/12 1305  NA 135 138 137  K 4.0 4.3 4.4  CL 97 100 98  CO2 27 29 31   GLUCOSE 147* 132* 130*  BUN 18 17 16   CREATININE 1.05 1.20 1.26  CALCIUM 9.5 9.4 9.8  MG -- -- --  PHOS -- -- --   Liver Function Tests:  Lab  11/28/12 1415  AST 24  ALT 14  ALKPHOS 85  BILITOT 0.3  PROT 7.7  ALBUMIN 4.0   No results found for this basename: LIPASE:5,AMYLASE:5 in the last 168 hours No results found for this basename: AMMONIA:5 in the last 168 hours CBC:  Lab 11/29/12 0530 11/28/12 1415 11/28/12 1305  WBC 12.7* 17.4* 14.9*  NEUTROABS -- 16.2* 13.9*  HGB 12.9* 13.5 13.7  HCT 39.1 40.6 41.8  MCV 98.0 99.0 99.3  PLT 181 173 188   Cardiac Enzymes:  Lab 11/28/12 1415  CKTOTAL --  CKMB --  CKMBINDEX --  TROPONINI <0.30   BNP (last 3 results)  Basename 11/28/12 1305 02/27/12 0332 02/22/12 0230  PROBNP 90.9 152.3* 1675.0*   CBG:  Lab 11/29/12 0556 11/28/12 2135  GLUCAP 120* 146*    No results found for this or any previous visit (from the past 240 hour(s)).   Studies: Dg Chest 2 View  11/28/2012  *RADIOLOGY REPORT*  Clinical Data: Short of breath.  Chest pain and cough  CHEST - 2 VIEW  Comparison: 02/25/2012  Findings: COPD with hyperinflation.  Prominent lung markings. Negative for pneumonia or effusion.  Negative for heart failure or edema.  Thoracic  disc degeneration and spondylosis diffusely.  No acute bony abnormality.  IMPRESSION: COPD.  No acute cardiopulmonary abnormality.   Original Report Authenticated By: Janeece Riggers, M.D.     Scheduled Meds:   . albuterol  2.5 mg Nebulization Q6H   And  . ipratropium  0.5 mg Nebulization Q6H  . amLODipine  10 mg Oral Daily  . aspirin  81 mg Oral Daily  . carvedilol  6.25 mg Oral BID WC  . colchicine  0.6 mg Oral Daily  . enoxaparin (LOVENOX) injection  40 mg Subcutaneous Q24H  . fenofibrate  54 mg Oral Daily  . furosemide  20 mg Oral Daily  . indomethacin  50 mg Oral BID WC  . insulin aspart  0-20 Units Subcutaneous TID WC  . insulin aspart  0-5 Units Subcutaneous QHS  . isosorbide-hydrALAZINE  1 tablet Oral TID  . levofloxacin  750 mg Intravenous Q24H  . [COMPLETED] methylPREDNISolone (SOLU-MEDROL) injection  125 mg Intravenous Once  .  methylPREDNISolone (SOLU-MEDROL) injection  40 mg Intravenous Q12H  . mometasone-formoterol  2 puff Inhalation BID  . sodium chloride  3 mL Intravenous Q12H  . sodium chloride  3 mL Intravenous Q12H  . spironolactone  12.5 mg Oral Daily  . [DISCONTINUED] albuterol  2.5 mg Nebulization Q4H  . [DISCONTINUED] albuterol  2.5 mg Nebulization Q4H  . [DISCONTINUED] ipratropium  0.5 mg Nebulization Q4H  . [DISCONTINUED] ipratropium  0.5 mg Nebulization Q6H  . [DISCONTINUED] levofloxacin  750 mg Intravenous Q24H  . [DISCONTINUED] methylPREDNISolone (SOLU-MEDROL) injection  60 mg Intravenous Q6H   Continuous Infusions:   Principal Problem:  *COPD exacerbation Active Problems:  DM (diabetes mellitus)  HTN (hypertension)  CAD (coronary artery disease)  Tobacco abuse    Time spent: 25    Marlin Canary  [facility] Pager [phone]. If 8PM-8AM, please contact night-coverage at [email or web address], password [facility] 11/29/2012, 8:47 AM  LOS: 1 day

## 2012-11-29 NOTE — Progress Notes (Signed)
   CARE MANAGEMENT NOTE 11/29/2012  Patient:  John [name], John [name]   Account Number:  [id number]  Date Initiated:  11/29/2012  Documentation initiated by:  [facility]  Subjective/Objective Assessment:   COPD     Action/Plan:   Anticipated DC Date:  11/30/2012   Anticipated DC Plan:  HOME W HOME HEALTH SERVICES      DC Planning Services  CM consult  Medication Assistance      Choice offered to / List presented to:             Status of service:  Completed, signed off Medicare Important Message given?   (If response is "NO", the following Medicare IM given date fields will be blank) Date Medicare IM given:   Date Additional Medicare IM given:    Discharge Disposition:    Per UR Regulation:    If discussed at Long Length of Stay Meetings, dates discussed:    Comments:  11/29/2012 1300 NCM spoke to pt and states he does not have any difficulty with paying for medications. Pt states he has Medicaid. NCM explained will need info on Medicaid. Isidoro Donning RN CCM Case Mgmt phone [phone]

## 2012-11-29 NOTE — Progress Notes (Signed)
Physical Therapy Evaluation Patient Details Name: John [name] MRN: [id number] DOB: March 13, 1946 Today's Date: 11/29/2012 Time: 1914-7829 PT Time Calculation (min): 23 min  PT Assessment / Plan / Recommendation Clinical Impression  66 yo male admitted with COPD exacerbation; Presents to PT with decr activity tol, cardiopulmonary status limiting activity; Will benefit from PT, likely one more session, to safely progress activity tol    PT Assessment  Patient needs continued PT services    Follow Up Recommendations  Other (comment);No PT follow up [facility] for chronic disease management; Outpt Cardiopulmonary rehab)    Does the patient have the potential to tolerate intense rehabilitation      Barriers to Discharge None      Equipment Recommendations  None recommended by PT (recommend pt use his O2 during the day prn)    Recommendations for Other Services     Frequency Min 3X/week (likely just 1 mroe session)    Precautions / Restrictions Precautions Precaution Comments: SOB with exertion   Pertinent Vitals/Pain O2 sats dropped to 87% with amb on room air; restarted O2 2L and sats increased back to greater than or equal to 92%      Mobility  Bed Mobility Bed Mobility: Supine to Sit Supine to Sit: 6: Modified independent (Device/Increase time) Details for Bed Mobility Assistance: Smooth transition Transfers Transfers: Sit to Stand;Stand to Sit Sit to Stand: 6: Modified independent (Device/Increase time) Stand to Sit: 6: Modified independent (Device/Increase time) Details for Transfer Assistance: smooth and steady transition Ambulation/Gait Ambulation/Gait Assistance: 5: Supervision Ambulation Distance (Feet): 30 Feet Assistive device: None Ambulation/Gait Assistance Details: Cues to self-monitor for activity tolerance; Noted some dyspnea with activity/walking on Room Air, which coincided with dropping O2 sats Gait Pattern: Within Functional Limits    Shoulder  Instructions     Exercises     PT Diagnosis: Difficulty walking;Other (comment) (decr functional capacity)  PT Problem List: Decreased activity tolerance;Cardiopulmonary status limiting activity PT Treatment Interventions: Gait training;Stair training;Functional mobility training;Therapeutic activities;Therapeutic exercise;Patient/family education   PT Goals Acute Rehab PT Goals PT Goal Formulation: With patient Time For Goal Achievement: 12/06/12 Potential to Achieve Goals: Good Pt will Ambulate: >150 feet;Independently (with appropriate and safe self-monitor for activity tol) PT Goal: Ambulate - Progress: Goal set today Pt will Go Up / Down Stairs: 1-2 stairs;with modified independence PT Goal: Up/Down Stairs - Progress: Goal set today  Visit Information  Last PT Received On: 11/29/12 Assistance Needed: +1    Subjective Data  Subjective: Reports feels fine Patient Stated Goal: Home   Prior Functioning  Home Living Lives With: Family (nephew is long-distance Naval architect) Available Help at Discharge: Family Type of Home: House Home Access: Stairs to enter Secretary/administrator of Steps: 1 Entrance Stairs-Rails: None (Reports no problems) Home Layout: One level Home Adaptive Equipment:  (O2 pt uses at night) Prior Function Level of Independence: Independent Able to Take Stairs?: Yes Driving: No Comments: Walks to store down the street daily; has a hard time walking home as he walks uphill Communication Communication: No difficulties    Cognition  Overall Cognitive Status: Appears within functional limits for tasks assessed/performed Arousal/Alertness: Awake/alert Orientation Level: Appears intact for tasks assessed Behavior During Session: [facility] for tasks performed    Extremity/Trunk Assessment Right Upper Extremity Assessment RUE ROM/Strength/Tone: [facility] for tasks assessed Left Upper Extremity Assessment LUE ROM/Strength/Tone: [facility] for tasks assessed Right Lower  Extremity Assessment RLE ROM/Strength/Tone: [facility] for tasks assessed Left Lower Extremity Assessment LLE ROM/Strength/Tone: [facility] for tasks assessed  Balance    End of Session PT - End of Session Equipment Utilized During Treatment: Oxygen Activity Tolerance: Patient tolerated treatment well;Other (comment) (though limited by DOE) Patient left: in chair;with call bell/phone within reach Nurse Communication: Mobility status;Other (comment) (O2 sats)  GP     Van Clines [facility] [place], [place] [phone]  11/29/2012, 12:41 PM

## 2012-11-30 LAB — GLUCOSE, CAPILLARY: Glucose-Capillary: 122 mg/dL — ABNORMAL HIGH (ref 70–99)

## 2012-11-30 MED ORDER — COLCHICINE 0.6 MG PO TABS: 0.6000 mg | ORAL_TABLET | Freq: Every day | ORAL | Status: DC

## 2012-11-30 MED ORDER — FUROSEMIDE 20 MG PO TABS: 20.0000 mg | ORAL_TABLET | Freq: Every day | ORAL | Status: DC

## 2012-11-30 MED ORDER — AMLODIPINE BESYLATE 10 MG PO TABS: 10.0000 mg | ORAL_TABLET | Freq: Every day | ORAL | Status: DC

## 2012-11-30 MED ORDER — ISOSORB DINITRATE-HYDRALAZINE 20-37.5 MG PO TABS: 1.0000 | ORAL_TABLET | Freq: Three times a day (TID) | ORAL | Status: DC

## 2012-11-30 MED ORDER — POLYETHYLENE GLYCOL 3350 17 G PO PACK
17.0000 g | PACK | Freq: Every day | ORAL | Status: DC
  Administered 2012-11-30: 17 g via ORAL
  Filled 2012-11-30: qty 1

## 2012-11-30 MED ORDER — ALBUTEROL SULFATE HFA 108 (90 BASE) MCG/ACT IN AERS: 2.0000 | INHALATION_SPRAY | RESPIRATORY_TRACT | Status: DC | PRN

## 2012-11-30 MED ORDER — FLUTICASONE-SALMETEROL 500-50 MCG/DOSE IN AEPB: 1.0000 | INHALATION_SPRAY | Freq: Two times a day (BID) | RESPIRATORY_TRACT | Status: DC

## 2012-11-30 MED ORDER — POLYETHYLENE GLYCOL 3350 17 G PO PACK: 17.0000 g | PACK | Freq: Every day | ORAL | Status: DC

## 2012-11-30 MED ORDER — SPIRONOLACTONE 25 MG PO TABS: 12.5000 mg | ORAL_TABLET | Freq: Every day | ORAL | Status: DC

## 2012-11-30 MED ORDER — INDOMETHACIN 50 MG PO CAPS: 50.0000 mg | ORAL_CAPSULE | Freq: Two times a day (BID) | ORAL | Status: DC

## 2012-11-30 MED ORDER — FENOFIBRATE 48 MG PO TABS: 48.0000 mg | ORAL_TABLET | Freq: Every day | ORAL | Status: DC

## 2012-11-30 MED ORDER — LEVOFLOXACIN 750 MG PO TABS: 750.0000 mg | ORAL_TABLET | Freq: Every day | ORAL | Status: DC

## 2012-11-30 MED ORDER — CARVEDILOL 6.25 MG PO TABS: 6.2500 mg | ORAL_TABLET | Freq: Two times a day (BID) | ORAL | Status: DC

## 2012-11-30 MED ORDER — LEVOFLOXACIN 750 MG PO TABS
750.0000 mg | ORAL_TABLET | Freq: Every day | ORAL | Status: DC
  Filled 2012-11-30: qty 1

## 2012-11-30 MED ORDER — PREDNISONE (PAK) 10 MG PO TABS: 60.0000 mg | ORAL_TABLET | Freq: Every day | ORAL | Status: DC

## 2012-11-30 NOTE — Progress Notes (Signed)
Physical Therapy Treatment Patient Details Name: John [name] MRN: [id number] DOB: 1946-03-13 Today's Date: 11/30/2012 Time: 7829-5621 PT Time Calculation (min): 12 min  PT Assessment / Plan / Recommendation Comments on Treatment Session  Pt. admitted with COPD exacerbation; Pt. progressing with mobility but continues to be SOB with activity. When PT entered room pt. on room air at 90%; after ambulation and ascending/descending stairs pts. O2 at 81% so pt. placed back on 3L O2. At end of tx. O2 at 93%. Pt. educated on energy conservation and able to state that he should rest when feeling SOB.    Follow Up Recommendations  No PT follow up           Equipment Recommendations  None recommended by PT       Frequency Min 3X/week   Plan Discharge plan remains appropriate;Frequency remains appropriate    Precautions / Restrictions Restrictions Weight Bearing Restrictions: No   Pertinent Vitals/Pain O2 at 90% on room air at start of tx; 81% at lowest after ambulation and stairs; 93% at end of tx on 3L    Mobility  Bed Mobility Bed Mobility: Not assessed Details for Bed Mobility Assistance: Pt. sitting in chair Transfers Sit to Stand: 6: Modified independent (Device/Increase time) Stand to Sit: 6: Modified independent (Device/Increase time) Ambulation/Gait Ambulation/Gait Assistance: 6: Modified independent (Device/Increase time) Ambulation Distance (Feet): 150 Feet Assistive device: None Ambulation/Gait Assistance Details: Pt. SOB with ambulation on room air. Cueing to rest with SOB. Gait Pattern: Within Functional Limits Stairs: Yes Stairs Assistance: 6: Modified independent (Device/Increase time) Stair Management Technique: No rails Number of Stairs: 10     Exercises General Exercises - Lower Extremity Long Arc Quad: AROM;Both;20 reps;Seated Hip Flexion/Marching: AROM;Both;20 reps;Seated Toe Raises: AROM;Both;20 reps;Seated    PT Goals Acute Rehab PT Goals PT Goal:  Ambulate - Progress: Met PT Goal: Up/Down Stairs - Progress: Met  Visit Information  Last PT Received On: 11/30/12 Assistance Needed: +1    Subjective Data  Subjective: "I'm ready to get out of here."   Cognition  Overall Cognitive Status: Appears within functional limits for tasks assessed/performed Arousal/Alertness: Awake/alert Orientation Level: Appears intact for tasks assessed Behavior During Session: [facility] for tasks performed       End of Session PT - End of Session Activity Tolerance: Patient tolerated treatment well Patient left: in chair;with call bell/phone within reach;with family/visitor present (Pts. neighbor/ride home) Nurse Communication: Mobility status     Army Chaco SPT 11/30/2012, 2:33 PM

## 2012-11-30 NOTE — Progress Notes (Signed)
Seen and agree with SPT note Khiya Friese Tabor Brannon Levene, PT [phone]  

## 2012-11-30 NOTE — Progress Notes (Signed)
11/30/12 1310 In to speak with pt. about [facility] services, as well as giving pt. list of [facility] agencies.  Pt. chose [facility].  TC to Debbie, with [facility], to give referral for [facility] RN, HH PT/OT, HH CSW.  Pt. has home oxygen thru [facility].  Pt. to dc home today with family.  Tera Mater, RN, BSN  NCM [phone]

## 2012-11-30 NOTE — Progress Notes (Signed)
Patient's IV and telemetry has been discontinued and patient is being discharged home; patient verbalizes understanding of discharge instructions.  Lorretta Harp RN

## 2012-11-30 NOTE — Discharge Summary (Signed)
Patient seen and examined by me.  Feeling better and ready to go home- will need O2 24/7 for now.  Home health to help with weaning off.  Patient has been advised to quit smoking.  He will also need to follow up with a PCP- case manager has been asked to assist with this.  Marlin Canary DO

## 2012-11-30 NOTE — Discharge Summary (Signed)
Physician Discharge Summary  John [name]:[id number] DOB: [birth year] DOA: 11/28/2012  PCP: No primary provider on file.  Admit date: 11/28/2012 Discharge date: 11/30/2012  Time spent: 40 minutes  Recommendations for Outpatient Follow-up:  1. Will need follow up for COPD exacerbation by PCP within 2 weeks.  Discharge Diagnoses:  Principal Problem:  *COPD exacerbation Active Problems:  DM (diabetes mellitus)  HTN (hypertension)  CAD (coronary artery disease)  Tobacco abuse   Discharge Condition: Stable with chronic rales  Diet recommendation: Heart Healthy  Filed Weights   11/28/12 1834 11/29/12 0500 11/30/12 0522  Weight: 83.144 kg (183 lb 4.8 oz) 82.645 kg (182 lb 3.2 oz) 82 kg (180 lb 12.4 oz)    History of present illness:  John [name] is a 66 y.o.AA male who was admitted in Febuary of 2013 with a similar episode of COPD ex. He comes in to the ER today c/o SOB and the inability to lay flat. He is on home O2 that he says he only wears at night (3-4L) and PRN during the day. He used to go to [facility] for his medical care. Does not currently have a PCP. He continues to smoke- he says occasionally, the neighbor says continuously. Yesterday his breathing worsened. He denies fever and chill, CP.  In the ER he was found to have an elevated WBC count as well as hypoxia- he de-satted to the 80s on room air. He also describes the inability to lay flat. He was given nebs, solumedrol in the ER.   Hospital Course:  COPD exacerbation (acute) [name] improved with antibiotics, nebulizers and steroids.  He uses oxygen 3 - 4 L chronically at home.   This does not appear to be heart failure as patient BNP was < 100.  He will be discharged on 5 more days of Levaquin, as well as advair, albuterol and prednisone taper.                               DM He was managed with SSI - S as an inpatient.  His Hgb A1C is 5.0.  He will be discharged on a prednisone taper so I would expect  his CBG to be elevated at his follow up appointment.  Tobacco abuse Patient commits to quitting.  HTN Continue home medications (Carvedilol, Bidil, Spironolactone, lasix)  CKD Seems to be at baseline.  Quiet during this admission.  CAD Stable.  Daily 81 mg Asa.  Follow up Patient was with [facility].  We have requested that case management arrange for PCP follow up.  We will give Rx for all home medications at discharge.   Discharge Exam: Filed Vitals:   11/30/12 0215 11/30/12 0522 11/30/12 0929 11/30/12 0943  BP:  147/54  129/72  Pulse:  77  61  Temp:  98.1 F (36.7 C)  98.1 F (36.7 C)  TempSrc:  Oral  Oral  Resp:  18  18  Height:      Weight:  82 kg (180 lb 12.4 oz)    SpO2: 93% 96% 96% 97%    General: A&O, Pleasant,  Wearing nasal canula Cardiovascular: RRR no M/R/G Respiratory: Rales bilaterally.  No wheeze or crackles, no accessory muscle movement Abdomen:  Soft, NT, ND, +BS Extremities:  No Lower extremity edema.   Discharge Instructions      Discharge Orders    Future Orders Please Complete By Expires   Diet - low  sodium heart healthy      Increase activity slowly          Medication List     As of 11/30/2012  9:59 AM    TAKE these medications         albuterol 108 (90 BASE) MCG/ACT inhaler   Commonly known as: PROVENTIL HFA;VENTOLIN HFA   Inhale 2 puffs into the lungs every 4 (four) hours as needed for wheezing.      amLODipine 10 MG tablet   Commonly known as: NORVASC   Take 1 tablet (10 mg total) by mouth daily.      aspirin 81 MG chewable tablet   Chew 81 mg by mouth daily.      carvedilol 6.25 MG tablet   Commonly known as: COREG   Take 1 tablet (6.25 mg total) by mouth 2 (two) times daily with a meal.      colchicine 0.6 MG tablet   Take 1 tablet (0.6 mg total) by mouth daily.      fenofibrate 48 MG tablet   Commonly known as: TRICOR   Take 1 tablet (48 mg total) by mouth daily.      Fluticasone-Salmeterol 500-50 MCG/DOSE  Aepb   Commonly known as: ADVAIR   Inhale 1 puff into the lungs every 12 (twelve) hours.      furosemide 20 MG tablet   Commonly known as: LASIX   Take 1 tablet (20 mg total) by mouth daily.      indomethacin 50 MG capsule   Commonly known as: INDOCIN   Take 1 capsule (50 mg total) by mouth 2 (two) times daily with a meal.      isosorbide-hydrALAZINE 20-37.5 MG per tablet   Commonly known as: BIDIL   Take 1 tablet by mouth 3 (three) times daily.      levofloxacin 750 MG tablet   Commonly known as: LEVAQUIN   Take 1 tablet (750 mg total) by mouth daily.      polyethylene glycol packet   Commonly known as: MIRALAX / GLYCOLAX   Take 17 g by mouth daily.      predniSONE 10 MG tablet   Commonly known as: STERAPRED UNI-PAK   Take 6 tablets (60 mg total) by mouth daily. Decrease dose by 10 mg every 2 days.   Take 60 mg daily for 2 days  Take 50 mg daily for 2 days  Take 40 mg daily for 2 days  Take 30 mg daily for 2 days   Take 20 mg daily for 2 days  Take 10 mg daily for 2 days  Then stop.      spironolactone 25 MG tablet   Commonly known as: ALDACTONE   Take 0.5 tablets (12.5 mg total) by mouth daily.        Follow-up Information    Schedule an appointment as soon as possible for a visit in 2 weeks to follow up.          The results of significant diagnostics from this hospitalization (including imaging, microbiology, ancillary and laboratory) are listed below for reference.    Significant Diagnostic Studies: Dg Chest 2 View  11/28/2012  *RADIOLOGY REPORT*  Clinical Data: Short of breath.  Chest pain and cough  CHEST - 2 VIEW  Comparison: 02/25/2012  Findings: COPD with hyperinflation.  Prominent lung markings. Negative for pneumonia or effusion.  Negative for heart failure or edema.  Thoracic disc degeneration and spondylosis diffusely.  No acute bony abnormality.  IMPRESSION:  COPD.  No acute cardiopulmonary abnormality.   Original Report Authenticated By:  Janeece Riggers, M.D.     Labs: Basic Metabolic Panel:  Lab 11/29/12 1914 11/28/12 1415 11/28/12 1305  NA 135 138 137  K 4.0 4.3 4.4  CL 97 100 98  CO2 27 29 31   GLUCOSE 147* 132* 130*  BUN 18 17 16   CREATININE 1.05 1.20 1.26  CALCIUM 9.5 9.4 9.8  MG -- -- --  PHOS -- -- --   Liver Function Tests:  Lab 11/28/12 1415  AST 24  ALT 14  ALKPHOS 85  BILITOT 0.3  PROT 7.7  ALBUMIN 4.0   CBC:  Lab 11/29/12 0530 11/28/12 1415 11/28/12 1305  WBC 12.7* 17.4* 14.9*  NEUTROABS -- 16.2* 13.9*  HGB 12.9* 13.5 13.7  HCT 39.1 40.6 41.8  MCV 98.0 99.0 99.3  PLT 181 173 188   Cardiac Enzymes:  Lab 11/28/12 1415  CKTOTAL --  CKMB --  CKMBINDEX --  TROPONINI <0.30   BNP: BNP (last 3 results)  Basename 11/28/12 1305 02/27/12 0332 02/22/12 0230  PROBNP 90.9 152.3* 1675.0*   CBG:  Lab 11/30/12 0559 11/29/12 2137 11/29/12 1611 11/29/12 1120 11/29/12 0556  GLUCAP 122* 114* 123* 140* 120*       Signed:  Conley Canal [facility] [phone] 11/30/2012, 9:59 AM

## 2012-12-06 ENCOUNTER — Emergency Department (HOSPITAL_COMMUNITY): Payer: Medicare Other

## 2012-12-06 ENCOUNTER — Encounter (HOSPITAL_COMMUNITY): Payer: Self-pay | Admitting: *Deleted

## 2012-12-06 ENCOUNTER — Inpatient Hospital Stay (HOSPITAL_COMMUNITY)
Admission: EM | Admit: 2012-12-06 | Discharge: 2012-12-08 | DRG: 606 | Disposition: A | Discharge status: Home Health | Payer: Medicare Other | Attending: Internal Medicine | Admitting: Internal Medicine

## 2012-12-06 DIAGNOSIS — I10 Essential (primary) hypertension: Secondary | ICD-10-CM

## 2012-12-06 DIAGNOSIS — I252 Old myocardial infarction: Secondary | ICD-10-CM

## 2012-12-06 DIAGNOSIS — T50905A Adverse effect of unspecified drugs, medicaments and biological substances, initial encounter: Secondary | ICD-10-CM | POA: Diagnosis present

## 2012-12-06 DIAGNOSIS — E785 Hyperlipidemia, unspecified: Secondary | ICD-10-CM | POA: Diagnosis present

## 2012-12-06 DIAGNOSIS — Z79899 Other long term (current) drug therapy: Secondary | ICD-10-CM

## 2012-12-06 DIAGNOSIS — I509 Heart failure, unspecified: Secondary | ICD-10-CM | POA: Diagnosis present

## 2012-12-06 DIAGNOSIS — J9620 Acute and chronic respiratory failure, unspecified whether with hypoxia or hypercapnia: Secondary | ICD-10-CM | POA: Diagnosis present

## 2012-12-06 DIAGNOSIS — R7309 Other abnormal glucose: Secondary | ICD-10-CM | POA: Diagnosis present

## 2012-12-06 DIAGNOSIS — J441 Chronic obstructive pulmonary disease with (acute) exacerbation: Secondary | ICD-10-CM

## 2012-12-06 DIAGNOSIS — T7840XA Allergy, unspecified, initial encounter: Secondary | ICD-10-CM

## 2012-12-06 DIAGNOSIS — E119 Type 2 diabetes mellitus without complications: Secondary | ICD-10-CM | POA: Diagnosis present

## 2012-12-06 DIAGNOSIS — M109 Gout, unspecified: Secondary | ICD-10-CM | POA: Diagnosis present

## 2012-12-06 DIAGNOSIS — I2510 Atherosclerotic heart disease of native coronary artery without angina pectoris: Secondary | ICD-10-CM

## 2012-12-06 DIAGNOSIS — L270 Generalized skin eruption due to drugs and medicaments taken internally: Secondary | ICD-10-CM | POA: Diagnosis present

## 2012-12-06 DIAGNOSIS — T368X5A Adverse effect of other systemic antibiotics, initial encounter: Secondary | ICD-10-CM | POA: Diagnosis present

## 2012-12-06 DIAGNOSIS — J449 Chronic obstructive pulmonary disease, unspecified: Secondary | ICD-10-CM

## 2012-12-06 DIAGNOSIS — T380X5A Adverse effect of glucocorticoids and synthetic analogues, initial encounter: Secondary | ICD-10-CM | POA: Diagnosis present

## 2012-12-06 DIAGNOSIS — Z87891 Personal history of nicotine dependence: Secondary | ICD-10-CM

## 2012-12-06 LAB — CREATININE, SERUM
Creatinine, Ser: 1.06 mg/dL (ref 0.50–1.35)
GFR calc non Af Amer: 70 mL/min — ABNORMAL LOW (ref >90)

## 2012-12-06 LAB — CBC WITH DIFFERENTIAL/PLATELET
Basophils Absolute: 0 10*3/uL (ref 0.0–0.1)
Eosinophils Relative: 0 % (ref 0–5)
HCT: 37.6 % — ABNORMAL LOW (ref 39.0–52.0)
Lymphocytes Relative: 4 % — ABNORMAL LOW (ref 12–46)
Lymphs Abs: 0.7 10*3/uL (ref 0.7–4.0)
MCV: 97.2 fL (ref 78.0–100.0)
Monocytes Absolute: 0.7 10*3/uL (ref 0.1–1.0)
Neutro Abs: 16.2 10*3/uL — ABNORMAL HIGH (ref 1.7–7.7)
RBC: 3.87 MIL/uL — ABNORMAL LOW (ref 4.22–5.81)
WBC: 17.6 10*3/uL — ABNORMAL HIGH (ref 4.0–10.5)

## 2012-12-06 LAB — CBC
Hemoglobin: 11.2 g/dL — ABNORMAL LOW (ref 13.0–17.0)
MCHC: 32.7 g/dL (ref 30.0–36.0)
RDW: 12.6 % (ref 11.5–15.5)

## 2012-12-06 LAB — COMPREHENSIVE METABOLIC PANEL
ALT: 10 U/L (ref 0–53)
AST: 18 U/L (ref 0–37)
CO2: 26 mEq/L (ref 19–32)
Calcium: 9.1 mg/dL (ref 8.4–10.5)
Chloride: 96 mEq/L (ref 96–112)
Creatinine, Ser: 1.12 mg/dL (ref 0.50–1.35)
GFR calc Af Amer: 76 mL/min — ABNORMAL LOW (ref >90)
GFR calc non Af Amer: 66 mL/min — ABNORMAL LOW (ref >90)
Glucose, Bld: 164 mg/dL — ABNORMAL HIGH (ref 70–99)
Sodium: 133 mEq/L — ABNORMAL LOW (ref 135–145)
Total Bilirubin: 0.6 mg/dL (ref 0.3–1.2)

## 2012-12-06 MED ORDER — ALBUTEROL SULFATE (5 MG/ML) 0.5% IN NEBU
5.0000 mg | INHALATION_SOLUTION | Freq: Once | RESPIRATORY_TRACT | Status: AC
  Administered 2012-12-06: 5 mg via RESPIRATORY_TRACT

## 2012-12-06 MED ORDER — ONDANSETRON HCL 4 MG PO TABS: 4.0000 mg | ORAL_TABLET | Freq: Four times a day (QID) | ORAL | Status: DC | PRN

## 2012-12-06 MED ORDER — FAMOTIDINE IN NACL 20-0.9 MG/50ML-% IV SOLN
20.0000 mg | Freq: Once | INTRAVENOUS | Status: AC
  Administered 2012-12-06: 20 mg via INTRAVENOUS
  Filled 2012-12-06: qty 50

## 2012-12-06 MED ORDER — ALBUTEROL (5 MG/ML) CONTINUOUS INHALATION SOLN
10.0000 mg/h | INHALATION_SOLUTION | Freq: Once | RESPIRATORY_TRACT | Status: AC
  Administered 2012-12-06: 10 mg/h via RESPIRATORY_TRACT
  Filled 2012-12-06: qty 20

## 2012-12-06 MED ORDER — ALLOPURINOL 100 MG PO TABS
100.0000 mg | ORAL_TABLET | Freq: Every day | ORAL | Status: DC
  Administered 2012-12-07 – 2012-12-08 (×2): 100 mg via ORAL
  Filled 2012-12-06 (×2): qty 1

## 2012-12-06 MED ORDER — COLCHICINE 0.6 MG PO TABS
0.6000 mg | ORAL_TABLET | Freq: Every day | ORAL | Status: DC
  Administered 2012-12-07 – 2012-12-08 (×2): 0.6 mg via ORAL
  Filled 2012-12-06 (×2): qty 1

## 2012-12-06 MED ORDER — FENOFIBRATE 54 MG PO TABS
54.0000 mg | ORAL_TABLET | Freq: Every day | ORAL | Status: DC
  Administered 2012-12-07 – 2012-12-08 (×2): 54 mg via ORAL
  Filled 2012-12-06 (×2): qty 1

## 2012-12-06 MED ORDER — CARVEDILOL 6.25 MG PO TABS
6.2500 mg | ORAL_TABLET | Freq: Two times a day (BID) | ORAL | Status: DC
  Administered 2012-12-06 – 2012-12-08 (×4): 6.25 mg via ORAL
  Filled 2012-12-06 (×6): qty 1

## 2012-12-06 MED ORDER — ENOXAPARIN SODIUM 40 MG/0.4ML ~~LOC~~ SOLN
40.0000 mg | Freq: Every day | SUBCUTANEOUS | Status: DC
  Administered 2012-12-07 (×2): 40 mg via SUBCUTANEOUS
  Filled 2012-12-06 (×4): qty 0.4

## 2012-12-06 MED ORDER — CEFTRIAXONE SODIUM 1 G IJ SOLR
1.0000 g | INTRAMUSCULAR | Status: DC
  Administered 2012-12-06 – 2012-12-07 (×2): 1 g via INTRAVENOUS
  Filled 2012-12-06 (×3): qty 10

## 2012-12-06 MED ORDER — AMLODIPINE BESYLATE 10 MG PO TABS
10.0000 mg | ORAL_TABLET | Freq: Every day | ORAL | Status: DC
  Administered 2012-12-07 – 2012-12-08 (×2): 10 mg via ORAL
  Filled 2012-12-06 (×2): qty 1

## 2012-12-06 MED ORDER — IPRATROPIUM BROMIDE 0.02 % IN SOLN
RESPIRATORY_TRACT | Status: AC
  Filled 2012-12-06: qty 2.5

## 2012-12-06 MED ORDER — METHYLPREDNISOLONE SODIUM SUCC 125 MG IJ SOLR
80.0000 mg | Freq: Four times a day (QID) | INTRAMUSCULAR | Status: DC
  Filled 2012-12-06 (×2): qty 1.28

## 2012-12-06 MED ORDER — ALBUTEROL SULFATE (5 MG/ML) 0.5% IN NEBU: 2.5000 mg | INHALATION_SOLUTION | RESPIRATORY_TRACT | Status: DC | PRN

## 2012-12-06 MED ORDER — POLYETHYLENE GLYCOL 3350 17 G PO PACK
17.0000 g | PACK | Freq: Every day | ORAL | Status: DC | PRN
Start: 2012-12-06 — End: 2012-12-08
  Filled 2012-12-06: qty 1

## 2012-12-06 MED ORDER — ACETAMINOPHEN 650 MG RE SUPP: 650.0000 mg | Freq: Four times a day (QID) | RECTAL | Status: DC | PRN

## 2012-12-06 MED ORDER — ALBUTEROL SULFATE (5 MG/ML) 0.5% IN NEBU
2.5000 mg | INHALATION_SOLUTION | RESPIRATORY_TRACT | Status: DC
  Administered 2012-12-06: 2.5 mg via RESPIRATORY_TRACT
  Filled 2012-12-06: qty 0.5

## 2012-12-06 MED ORDER — ASPIRIN 81 MG PO CHEW
81.0000 mg | CHEWABLE_TABLET | Freq: Every day | ORAL | Status: DC
Start: 2012-12-07 — End: 2012-12-08
  Administered 2012-12-07 – 2012-12-08 (×2): 81 mg via ORAL
  Filled 2012-12-06 (×2): qty 1

## 2012-12-06 MED ORDER — ACETAMINOPHEN 325 MG PO TABS: 650.0000 mg | ORAL_TABLET | Freq: Four times a day (QID) | ORAL | Status: DC | PRN

## 2012-12-06 MED ORDER — ONDANSETRON HCL 4 MG/2ML IJ SOLN: 4.0000 mg | Freq: Four times a day (QID) | INTRAMUSCULAR | Status: DC | PRN

## 2012-12-06 MED ORDER — ISOSORB DINITRATE-HYDRALAZINE 20-37.5 MG PO TABS
1.0000 | ORAL_TABLET | Freq: Three times a day (TID) | ORAL | Status: DC
  Administered 2012-12-06 – 2012-12-08 (×5): 1 via ORAL
  Filled 2012-12-06 (×7): qty 1

## 2012-12-06 MED ORDER — IPRATROPIUM BROMIDE 0.02 % IN SOLN
0.5000 mg | Freq: Once | RESPIRATORY_TRACT | Status: AC
  Administered 2012-12-06: 0.5 mg via RESPIRATORY_TRACT

## 2012-12-06 MED ORDER — IPRATROPIUM BROMIDE 0.02 % IN SOLN: 0.5000 mg | Freq: Four times a day (QID) | RESPIRATORY_TRACT | Status: DC

## 2012-12-06 MED ORDER — ALBUTEROL SULFATE (5 MG/ML) 0.5% IN NEBU: 2.5000 mg | INHALATION_SOLUTION | Freq: Four times a day (QID) | RESPIRATORY_TRACT | Status: DC

## 2012-12-06 MED ORDER — METHYLPREDNISOLONE SODIUM SUCC 125 MG IJ SOLR
INTRAMUSCULAR | Status: AC
  Filled 2012-12-06: qty 2

## 2012-12-06 MED ORDER — METHYLPREDNISOLONE SODIUM SUCC 40 MG IJ SOLR
40.0000 mg | Freq: Four times a day (QID) | INTRAMUSCULAR | Status: DC
  Administered 2012-12-07 – 2012-12-08 (×6): 40 mg via INTRAVENOUS
  Filled 2012-12-06 (×11): qty 1

## 2012-12-06 MED ORDER — DIPHENHYDRAMINE HCL 50 MG/ML IJ SOLN
25.0000 mg | Freq: Once | INTRAMUSCULAR | Status: DC
  Filled 2012-12-06: qty 1

## 2012-12-06 MED ORDER — AZITHROMYCIN 500 MG PO TABS
500.0000 mg | ORAL_TABLET | Freq: Every day | ORAL | Status: DC
  Administered 2012-12-06 – 2012-12-07 (×2): 500 mg via ORAL
  Filled 2012-12-06 (×3): qty 1

## 2012-12-06 MED ORDER — ALBUTEROL SULFATE (5 MG/ML) 0.5% IN NEBU
INHALATION_SOLUTION | RESPIRATORY_TRACT | Status: AC
  Filled 2012-12-06: qty 1

## 2012-12-06 MED ORDER — DIPHENHYDRAMINE HCL 50 MG/ML IJ SOLN
50.0000 mg | Freq: Once | INTRAMUSCULAR | Status: AC
  Administered 2012-12-06: 50 mg via INTRAMUSCULAR

## 2012-12-06 MED ORDER — ENOXAPARIN SODIUM 30 MG/0.3ML ~~LOC~~ SOLN: 30.0000 mg | SUBCUTANEOUS | Status: DC

## 2012-12-06 MED ORDER — METHYLPREDNISOLONE SODIUM SUCC 125 MG IJ SOLR
125.0000 mg | Freq: Once | INTRAMUSCULAR | Status: AC
  Administered 2012-12-06: 125 mg via INTRAVENOUS

## 2012-12-06 MED ORDER — MOMETASONE FURO-FORMOTEROL FUM 200-5 MCG/ACT IN AERO
2.0000 | INHALATION_SPRAY | Freq: Two times a day (BID) | RESPIRATORY_TRACT | Status: DC
  Administered 2012-12-07 – 2012-12-08 (×3): 2 via RESPIRATORY_TRACT
  Filled 2012-12-06: qty 8.8

## 2012-12-06 NOTE — ED Notes (Signed)
Reports just being dc from hospital for copd, started on new meds and reports that they have increased his sob. Labored resp at triage, spo2 93% on room air. New meds are prednisone, levaquin and miralax.

## 2012-12-06 NOTE — ED Notes (Signed)
Respiratory called for nebulizer treatment.

## 2012-12-06 NOTE — ED Notes (Signed)
Pt presents to department for evaluation of SOB and generalized rash. States he was recently discharged from hospital for COPD exacerbation. Recently began taking several new medications (prednisone, levaquin and miralax). Pt states he noticed rash with itching several days ago. Respirations labored, speaking short phrases. Rhonchi heard upper lung fields. He is conscious alert and oriented x4.

## 2012-12-06 NOTE — Progress Notes (Signed)
Pt. New admit. Orders to page admitting MD to the floor received and implemented. Will continue to monitor pt. John [name], [name]

## 2012-12-06 NOTE — H&P (Signed)
[facility]          History and Physical    PCP:   No primary provider on file.   Chief Complaint:  Shortness of breath/rash  HPI: [name] is a 66/M with h/o COPD, CHF, DM was just discharged from [facility] after treatment for COPD exacerbation on 12/2 on Levaquin, prednisone taper and inhalers, he was breathing well when he went home, apparently just got these new medications on Friday and started them, woke up this morning with a rash/hives on his arms and chest and subsequently started feeling short of breath, with wheezing and came to the ER. He was in resp distress on initial presentation without stridor, improved after nebs and steroids  Allergies:   Allergies  Allergen Reactions  . Levaquin (Levofloxacin In D5w)   . Lisinopril Swelling      Past Medical History  Diagnosis Date  . CHF (congestive heart failure)   . MI (myocardial infarction)   . COPD (chronic obstructive pulmonary disease)   . Diabetes mellitus   . Hypertension   . Gout   . Hyperlipemia     Past Surgical History  Procedure Date  . Appendectomy     Prior to Admission medications   Medication Sig Start Date End Date Taking? Authorizing Provider  albuterol (VENTOLIN HFA) 108 (90 BASE) MCG/ACT inhaler Inhale 2 puffs into the lungs every 4 (four) hours as needed for wheezing. 11/30/12 11/30/13 Yes Marianne L York, PA  allopurinol (ZYLOPRIM) 100 MG tablet Take 100 mg by mouth daily.   Yes Historical Provider, MD  amLODipine (NORVASC) 10 MG tablet Take 1 tablet (10 mg total) by mouth daily. 11/30/12  Yes Tora Kindred York, PA  aspirin 81 MG chewable tablet Chew 81 mg by mouth daily.   Yes Historical Provider, MD  carvedilol (COREG) 6.25 MG tablet Take 1 tablet (6.25 mg total) by mouth 2 (two) times daily with a meal. 11/30/12  Yes Tora Kindred York, PA  colchicine 0.6 MG tablet Take 1 tablet (0.6 mg total) by mouth daily. 11/30/12  Yes Tora Kindred York, PA  fenofibrate (TRICOR) 48 MG tablet Take 1  tablet (48 mg total) by mouth daily. 11/30/12  Yes Tora Kindred York, PA  Fluticasone-Salmeterol (ADVAIR) 500-50 MCG/DOSE AEPB Inhale 1 puff into the lungs every 12 (twelve) hours. 11/30/12  Yes Tora Kindred York, PA  furosemide (LASIX) 20 MG tablet Take 1 tablet (20 mg total) by mouth daily. 11/30/12  Yes Tora Kindred York, PA  indomethacin (INDOCIN) 50 MG capsule Take 1 capsule (50 mg total) by mouth 2 (two) times daily with a meal. 11/30/12  Yes Tora Kindred York, PA  isosorbide-hydrALAZINE (BIDIL) 20-37.5 MG per tablet Take 1 tablet by mouth 3 (three) times daily. 11/30/12  Yes Tora Kindred York, PA  levofloxacin (LEVAQUIN) 750 MG tablet Take 1 tablet (750 mg total) by mouth daily. 11/30/12  Yes Tora Kindred York, PA  polyethylene glycol (MIRALAX / GLYCOLAX) packet Take 17 g by mouth daily. 11/30/12  Yes Tora Kindred York, PA  predniSONE (STERAPRED UNI-PAK) 10 MG tablet Take 6 tablets (60 mg total) by mouth daily. Decrease dose by 10 mg every 2 days.  Take 60 mg daily for 2 days Take 50 mg daily for 2 days Take 40 mg daily for 2 days Take 30 mg daily for 2 days  Take 20 mg daily for 2 days Take 10 mg daily for 2 days Then stop. 11/30/12  Yes Tora Kindred York, PA  spironolactone (ALDACTONE) 25  MG tablet Take 0.5 tablets (12.5 mg total) by mouth daily. 11/30/12 11/30/13 Yes Stephani Police, PA    Social History:  reports that he quit smoking about 9 months ago. He does not have any smokeless tobacco history on file. He reports that he drinks alcohol. His drug history not on file.  History reviewed. No pertinent family history.  Review of Systems: positives bolded Constitutional: Denies fever, chills, diaphoresis, appetite change and fatigue.  HEENT: Denies photophobia, eye pain, redness, hearing loss, ear pain, congestion, sore throat, rhinorrhea, sneezing, mouth sores, trouble swallowing, neck pain, neck stiffness and tinnitus.   Respiratory: Denies SOB, DOE, cough, chest tightness,  and wheezing.    Cardiovascular: Denies chest pain, palpitations and leg swelling.  Gastrointestinal: Denies nausea, vomiting, abdominal pain, diarrhea, constipation, blood in stool and abdominal distention.  Genitourinary: Denies dysuria, urgency, frequency, hematuria, flank pain and difficulty urinating.  Musculoskeletal: Denies myalgias, back pain, joint swelling, arthralgias and gait problem.  Skin: Denies pallor, rash and wound.  Neurological: Denies dizziness, seizures, syncope, weakness, light-headedness, numbness and headaches.  Hematological: Denies adenopathy. Easy bruising, personal or family bleeding history  Psychiatric/Behavioral: Denies suicidal ideation, mood changes, confusion, nervousness, sleep disturbance and agitation   Physical Exam: Blood pressure 152/72, pulse 88, temperature 98 F (36.7 C), temperature source Oral, resp. rate 18, height 5\' 6"  (1.676 m), weight 81.8 kg (180 lb 5.4 oz), SpO2 96.00%. Gen: AAOx3, no distress HEENT: PERRLA, EOMI CVS: S1S2/rrr Lungs: poor air movement bilaterally ABd: soft, NT,BS present EXt: no edema c/c/ NEuro: no locaslising signs SKin: no rashes or skin breakdown  Labs on Admission:  Results for orders placed during the hospital encounter of 12/06/12 (from the past 48 hour(s))  CBC WITH DIFFERENTIAL     Status: Abnormal   Collection Time   12/06/12  3:02 PM      Component Value Range Comment   WBC 17.6 (*) 4.0 - 10.5 K/uL    RBC 3.87 (*) 4.22 - 5.81 MIL/uL    Hemoglobin 12.6 (*) 13.0 - 17.0 g/dL    HCT 69.6 (*) 29.5 - 52.0 %    MCV 97.2  78.0 - 100.0 fL    MCH 32.6  26.0 - 34.0 pg    MCHC 33.5  30.0 - 36.0 g/dL    RDW 28.4  13.2 - 44.0 %    Platelets 211  150 - 400 K/uL    Neutrophils Relative 92 (*) 43 - 77 %    Neutro Abs 16.2 (*) 1.7 - 7.7 K/uL    Lymphocytes Relative 4 (*) 12 - 46 %    Lymphs Abs 0.7  0.7 - 4.0 K/uL    Monocytes Relative 4  3 - 12 %    Monocytes Absolute 0.7  0.1 - 1.0 K/uL    Eosinophils Relative 0  0 - 5 %     Eosinophils Absolute 0.0  0.0 - 0.7 K/uL    Basophils Relative 0  0 - 1 %    Basophils Absolute 0.0  0.0 - 0.1 K/uL   COMPREHENSIVE METABOLIC PANEL     Status: Abnormal   Collection Time   12/06/12  3:02 PM      Component Value Range Comment   Sodium 133 (*) 135 - 145 mEq/L    Potassium 4.4  3.5 - 5.1 mEq/L    Chloride 96  96 - 112 mEq/L    CO2 26  19 - 32 mEq/L    Glucose, Bld 164 (*) 70 -  99 mg/dL    BUN 23  6 - 23 mg/dL    Creatinine, Ser 1.61  0.50 - 1.35 mg/dL    Calcium 9.1  8.4 - 09.6 mg/dL    Total Protein 7.2  6.0 - 8.3 g/dL    Albumin 3.0 (*) 3.5 - 5.2 g/dL    AST 18  0 - 37 U/L    ALT 10  0 - 53 U/L    Alkaline Phosphatase 62  39 - 117 U/L    Total Bilirubin 0.6  0.3 - 1.2 mg/dL    GFR calc non Af Amer 66 (*) >90 mL/min    GFR calc Af Amer 76 (*) >90 mL/min   TROPONIN I     Status: Normal   Collection Time   12/06/12  3:02 PM      Component Value Range Comment   Troponin I <0.30  <0.30 ng/mL   PRO B NATRIURETIC PEPTIDE     Status: Abnormal   Collection Time   12/06/12  3:02 PM      Component Value Range Comment   Pro B Natriuretic peptide (BNP) 168.9 (*) 0 - 125 pg/mL     Radiological Exams on Admission: Dg Chest Portable 1 View  12/06/2012  *RADIOLOGY REPORT*  Clinical Data: Shortness of breath, allergic reaction  PORTABLE CHEST - 1 VIEW  Comparison: 11/28/2012  Findings: Cardiomediastinal silhouette is stable.  No acute infiltrate or pleural effusion.  No pulmonary edema.  Stable degenerative changes thoracic spine.  Mild hyperinflation again noted.  IMPRESSION:  No active disease.  Borderline cardiomegaly.   Original Report Authenticated By: Natasha Mead, M.D.     Assessment/Plan  1.  COPD exacerbation VS reactive airway disease related to allergic reaction Improved with IV solumedrol and nebs, will continue this overnight And add rocephin/zithromax for a short course O2 Quit smoking recently  2. Urticarial rash: likely drug allergy, improved with steroids,  levaquin listed as allergy  3. DM (diabetes mellitus): doubt this diagnosis,  steroid induced hyperglycemia likely, SSI for now, Hbaic 5.0 last admission  4. CHF: compensated, continue BB, bidil  5. DVT proph: lovenox  FULL CODE Disposition: inpatient  Time Spent on Admission:  Per [place] [facility] Pager: [phone] 12/06/2012, 8:26 PM

## 2012-12-06 NOTE — ED Provider Notes (Signed)
History     CSN: [id number]  Arrival date & time 12/06/12  1427   First MD Initiated Contact with Patient 12/06/12 1449      Chief Complaint  Patient presents with  . Shortness of Breath  . Allergic Reaction    (Consider location/radiation/quality/duration/timing/severity/associated sxs/prior treatment) HPI Comments: Patient presents with respiratory distress is no compressive worsening for the past 2 days. He is recently discharged for COPD exacerbation and sent home on prednisone, Levaquin and MiraLAX. Reports difficulty breathing is progressively worsening. He also has diffuse urticarial rash. Denies difficulty breathing or swallowing. Denies chest pain, fever, chills, other new exposure. States he quit smoking.  The history is provided by the patient. The history is limited by the condition of the patient.    Past Medical History  Diagnosis Date  . CHF (congestive heart failure)   . MI (myocardial infarction)   . COPD (chronic obstructive pulmonary disease)   . Diabetes mellitus   . Hypertension   . Gout   . Hyperlipemia     Past Surgical History  Procedure Date  . Appendectomy     History reviewed. No pertinent family history.  History  Substance Use Topics  . Smoking status: Former Smoker    Quit date: 02/22/2012  . Smokeless tobacco: Not on file  . Alcohol Use: Yes     Comment: 1/5 or pint per day      Review of Systems  Unable to perform ROS: Unstable vital signs  Respiratory: Positive for shortness of breath.     Allergies  Lisinopril  Home Medications   Current Outpatient Rx  Name  Route  Sig  Dispense  Refill  . ALBUTEROL SULFATE HFA 108 (90 BASE) MCG/ACT IN AERS   Inhalation   Inhale 2 puffs into the lungs every 4 (four) hours as needed for wheezing.   1 Inhaler   6   . ALLOPURINOL 100 MG PO TABS   Oral   Take 100 mg by mouth daily.         Marland Kitchen AMLODIPINE BESYLATE 10 MG PO TABS   Oral   Take 1 tablet (10 mg total) by mouth daily.   30 tablet   2   . ASPIRIN 81 MG PO CHEW   Oral   Chew 81 mg by mouth daily.         Marland Kitchen CARVEDILOL 6.25 MG PO TABS   Oral   Take 1 tablet (6.25 mg total) by mouth 2 (two) times daily with a meal.   30 tablet   2   . COLCHICINE 0.6 MG PO TABS   Oral   Take 1 tablet (0.6 mg total) by mouth daily.   30 tablet   1   . FENOFIBRATE 48 MG PO TABS   Oral   Take 1 tablet (48 mg total) by mouth daily.   30 tablet   1   . FLUTICASONE-SALMETEROL 500-50 MCG/DOSE IN AEPB   Inhalation   Inhale 1 puff into the lungs every 12 (twelve) hours.   60 each   1   . FUROSEMIDE 20 MG PO TABS   Oral   Take 1 tablet (20 mg total) by mouth daily.   30 tablet   1   . INDOMETHACIN 50 MG PO CAPS   Oral   Take 1 capsule (50 mg total) by mouth 2 (two) times daily with a meal.   60 capsule   1   . ISOSORB DINITRATE-HYDRALAZINE 20-37.5 MG PO TABS  Oral   Take 1 tablet by mouth 3 (three) times daily.   90 tablet   1   . LEVOFLOXACIN 750 MG PO TABS   Oral   Take 1 tablet (750 mg total) by mouth daily.   5 tablet   0   . POLYETHYLENE GLYCOL 3350 PO PACK   Oral   Take 17 g by mouth daily.   14 each   3   . PREDNISONE (PAK) 10 MG PO TABS   Oral   Take 6 tablets (60 mg total) by mouth daily. Decrease dose by 10 mg every 2 days.  Take 60 mg daily for 2 days Take 50 mg daily for 2 days Take 40 mg daily for 2 days Take 30 mg daily for 2 days  Take 20 mg daily for 2 days Take 10 mg daily for 2 days Then stop.   42 tablet   0   . SPIRONOLACTONE 25 MG PO TABS   Oral   Take 0.5 tablets (12.5 mg total) by mouth daily.   30 tablet   1     BP 114/65  Pulse 87  Temp 97.5 F (36.4 C) (Oral)  Resp 24  SpO2 99%  Physical Exam  Constitutional: He appears well-developed. He appears distressed.       Moderate respiratory distress, tachypnea.  HENT:  Head: Normocephalic and atraumatic.  Mouth/Throat: Oropharynx is clear and moist. No oropharyngeal exudate.       OP clear, uvula  midline  Eyes: Conjunctivae normal and EOM are normal. Pupils are equal, round, and reactive to light.  Neck: Normal range of motion. Neck supple.  Cardiovascular: Normal rate, regular rhythm and normal heart sounds.   Pulmonary/Chest: He is in respiratory distress. He has wheezes.  Abdominal: Soft. There is no tenderness. There is no rebound and no guarding.  Musculoskeletal: Normal range of motion. He exhibits no edema and no tenderness.  Neurological: He is alert. No cranial nerve deficit. He exhibits normal muscle tone. Coordination normal.  Skin: Skin is warm. Rash noted.       Diffuse urticaria    ED Course  Procedures (including critical care time)  Labs Reviewed  CBC WITH DIFFERENTIAL - Abnormal; Notable for the following:    WBC 17.6 (*)     RBC 3.87 (*)     Hemoglobin 12.6 (*)     HCT 37.6 (*)     Neutrophils Relative 92 (*)     Neutro Abs 16.2 (*)     Lymphocytes Relative 4 (*)     All other components within normal limits  COMPREHENSIVE METABOLIC PANEL - Abnormal; Notable for the following:    Sodium 133 (*)     Glucose, Bld 164 (*)     Albumin 3.0 (*)     GFR calc non Af Amer 66 (*)     GFR calc Af Amer 76 (*)     All other components within normal limits  PRO B NATRIURETIC PEPTIDE - Abnormal; Notable for the following:    Pro B Natriuretic peptide (BNP) 168.9 (*)     All other components within normal limits  TROPONIN I   Dg Chest Portable 1 View  12/06/2012  *RADIOLOGY REPORT*  Clinical Data: Shortness of breath, allergic reaction  PORTABLE CHEST - 1 VIEW  Comparison: 11/28/2012  Findings: Cardiomediastinal silhouette is stable.  No acute infiltrate or pleural effusion.  No pulmonary edema.  Stable degenerative changes thoracic spine.  Mild hyperinflation again noted.  IMPRESSION:  No active disease.  Borderline cardiomegaly.   Original Report Authenticated By: Natasha Mead, M.D.      No diagnosis found.    MDM  Patient presents with respiratory distress and  allergic reaction has been gradually worsening over the past 2 days. He was recently admitted for COPD exacerbation and discharged on December 2. Question reaction to Levaquin.  He is maintaining his airway though he has significant distress. Is given antihistamines, steroids, IV fluids. No epinephrine given cardiac history.  Chest x-ray negative. Patient much improved after antihistamines, steroids and nebulizers. He satting 100% on his home 2 L of oxygen.  Uncertain cause of allergic reaction and COPD exacerbation. Doubt ACS or PE.  He is receiving continuous nebulizer. He'll be reassessed after this. Care signed out to Dr. Ethelda Chick and Dr. Alinda Deem, resident.     Date: 12/06/2012  Rate: 69  Rhythm: normal sinus rhythm  QRS Axis: normal  Intervals: normal  ST/T Wave abnormalities: normal  Conduction Disutrbances:none  Narrative Interpretation:   Old EKG Reviewed: unchanged    Glynn Octave, MD 12/06/12 1645

## 2012-12-06 NOTE — ED Notes (Signed)
FAMILY MEMBER TO LEAVE ED, PLEASE John [name] @ [phone] WHEN READY FOR D/C OR ADMISSION.

## 2012-12-06 NOTE — ED Notes (Signed)
Pt does not feel that he can ambulate at this time due to feeling weak.

## 2012-12-06 NOTE — ED Provider Notes (Signed)
Care assumed from Dr. Manus Gunning, please see his note for previous care.   John [name] is a 66 yo AAM with history of COPD and CHF, recently DC from our facility 6 days ago for COPD exacerbation. He filled his Levaquin on Friday and started taking it. Today he developed hives and worsening SOB today. On arrival he was given antihistamines, steroids and bronchodilators with some improvement of his respiratory status, however he continued to be tachypneic with diffuse wheezing. Due to extremis presentation and unknown precipitant of his allergic reaction will admit for observation. He remained HD stable. Admitted to Hospitalist service. Discussed results of w/u with patient and his family members.   Reviewed imaging, labs and previous medical records, utilized in MDM  Discussed case with Dr. Manus Gunning  Clinical Impression 1. Allergic reaction.  2. Acute on chronic hypoxic respiratory failure  John Billet, MD 12/06/12 [phone]

## 2012-12-06 NOTE — ED Notes (Signed)
Floor nurse unable to take report at the time. Nurse to call back. 

## 2012-12-07 DIAGNOSIS — J441 Chronic obstructive pulmonary disease with (acute) exacerbation: Secondary | ICD-10-CM

## 2012-12-07 DIAGNOSIS — E119 Type 2 diabetes mellitus without complications: Secondary | ICD-10-CM

## 2012-12-07 LAB — BASIC METABOLIC PANEL
Calcium: 9.1 mg/dL (ref 8.4–10.5)
GFR calc Af Amer: 83 mL/min — ABNORMAL LOW (ref >90)
GFR calc non Af Amer: 72 mL/min — ABNORMAL LOW (ref >90)
Sodium: 139 mEq/L (ref 135–145)

## 2012-12-07 LAB — CBC
MCH: 31.6 pg (ref 26.0–34.0)
MCHC: 32.6 g/dL (ref 30.0–36.0)
Platelets: 247 10*3/uL (ref 150–400)
RBC: 3.51 MIL/uL — ABNORMAL LOW (ref 4.22–5.81)

## 2012-12-07 MED ORDER — IPRATROPIUM BROMIDE 0.02 % IN SOLN
0.5000 mg | Freq: Two times a day (BID) | RESPIRATORY_TRACT | Status: DC
  Administered 2012-12-07 – 2012-12-08 (×3): 0.5 mg via RESPIRATORY_TRACT
  Filled 2012-12-07 (×3): qty 2.5

## 2012-12-07 MED ORDER — ALBUTEROL SULFATE (5 MG/ML) 0.5% IN NEBU: 2.5000 mg | INHALATION_SOLUTION | Freq: Two times a day (BID) | RESPIRATORY_TRACT | Status: DC

## 2012-12-07 MED ORDER — ALBUTEROL SULFATE (5 MG/ML) 0.5% IN NEBU
2.5000 mg | INHALATION_SOLUTION | Freq: Two times a day (BID) | RESPIRATORY_TRACT | Status: DC
  Administered 2012-12-07 – 2012-12-08 (×3): 2.5 mg via RESPIRATORY_TRACT
  Filled 2012-12-07 (×3): qty 0.5

## 2012-12-07 NOTE — ED Provider Notes (Signed)
I saw and evaluated the patient, reviewed the resident's note and I agree with the findings and plan.   Glynn Octave, MD 12/07/12 [phone]

## 2012-12-07 NOTE — Progress Notes (Signed)
[facility]             Progress Note   Subjective: Breathing better, not back to baseline yet  Objective: Vital signs in last 24 hours: Temp:  [97.5 F (36.4 C)-98.8 F (37.1 C)] 98.5 F (36.9 C) (12/09 0455) Pulse Rate:  [60-89] 60  (12/09 0455) Resp:  [18-32] 19  (12/09 0455) BP: (114-154)/(58-94) 125/80 mmHg (12/09 0455) SpO2:  [93 %-100 %] 93 % (12/09 0738) Weight:  [80.876 kg (178 lb 4.8 oz)-81.8 kg (180 lb 5.4 oz)] 80.876 kg (178 lb 4.8 oz) (12/09 0455) Weight change:  Last BM Date: 12/06/12  Intake/Output from previous day: 12/08 0701 - 12/09 0700 In: -  Out: 800 [Urine:800] Total I/O In: 480 [P.O.:480] Out: -    Physical Exam: General: Alert, awake, oriented x3, in no acute distress. HEENT: No bruits, no goiter. Heart: Regular rate and rhythm, without murmurs, rubs, gallops. Lungs: poor air movement bilaterally. Abdomen: Soft, nontender, nondistended, positive bowel sounds. Extremities: No clubbing cyanosis or edema with positive pedal pulses. Neuro: Grossly intact, nonfocal.    Lab Results: Basic Metabolic Panel:  Basename 12/07/12 0715 12/06/12 2009 12/06/12 1502  NA 139 -- 133*  K 4.1 -- 4.4  CL 102 -- 96  CO2 27 -- 26  GLUCOSE 132* -- 164*  BUN 21 -- 23  CREATININE 1.04 1.06 --  CALCIUM 9.1 -- 9.1  MG -- -- --  PHOS -- -- --   Liver Function Tests:  Basename 12/06/12 1502  AST 18  ALT 10  ALKPHOS 62  BILITOT 0.6  PROT 7.2  ALBUMIN 3.0*   No results found for this basename: LIPASE:2,AMYLASE:2 in the last 72 hours No results found for this basename: AMMONIA:2 in the last 72 hours CBC:  Basename 12/07/12 0715 12/06/12 2009 12/06/12 1502  WBC 13.4* 14.8* --  NEUTROABS -- -- 16.2*  HGB 11.1* 11.2* --  HCT 34.0* 34.3* --  MCV 96.9 96.9 --  PLT 247 225 --   Cardiac Enzymes:  Basename 12/06/12 1502  CKTOTAL --  CKMB --  CKMBINDEX --  TROPONINI <0.30   BNP:  Basename 12/06/12 1502  PROBNP 168.9*    D-Dimer: No results found for this basename: DDIMER:2 in the last 72 hours CBG: No results found for this basename: GLUCAP:6 in the last 72 hours Hemoglobin A1C: No results found for this basename: HGBA1C in the last 72 hours Fasting Lipid Panel: No results found for this basename: CHOL,HDL,LDLCALC,TRIG,CHOLHDL,LDLDIRECT in the last 72 hours Thyroid Function Tests: No results found for this basename: TSH,T4TOTAL,FREET4,T3FREE,THYROIDAB in the last 72 hours Anemia Panel: No results found for this basename: VITAMINB12,FOLATE,FERRITIN,TIBC,IRON,RETICCTPCT in the last 72 hours Coagulation: No results found for this basename: LABPROT:2,INR:2 in the last 72 hours Urine Drug Screen: Drugs of Abuse     Component Value Date/Time   LABOPIA NONE DETECTED 05/09/2010 1210   COCAINSCRNUR NONE DETECTED 05/09/2010 1210   LABBENZ NONE DETECTED 05/09/2010 1210   AMPHETMU NONE DETECTED 05/09/2010 1210   THCU NONE DETECTED 05/09/2010 1210   LABBARB  Value: NONE DETECTED        DRUG SCREEN FOR MEDICAL PURPOSES ONLY.  IF CONFIRMATION IS NEEDED FOR ANY PURPOSE, NOTIFY LAB WITHIN 5 DAYS.        LOWEST DETECTABLE LIMITS FOR URINE DRUG SCREEN Drug Class       Cutoff (ng/mL) Amphetamine      1000 Barbiturate      200 Benzodiazepine   200 Tricyclics  300 Opiates          300 Cocaine          300 THC              50 05/09/2010 1210    Alcohol Level: No results found for this basename: ETH:2 in the last 72 hours Urinalysis: No results found for this basename: COLORURINE:2,APPERANCEUR:2,LABSPEC:2,PHURINE:2,GLUCOSEU:2,HGBUR:2,BILIRUBINUR:2,KETONESUR:2,PROTEINUR:2,UROBILINOGEN:2,NITRITE:2,LEUKOCYTESUR:2 in the last 72 hours  No results found for this or any previous visit (from the past 240 hour(s)).  Studies/Results: Dg Chest Portable 1 View  12/06/2012  *RADIOLOGY REPORT*  Clinical Data: Shortness of breath, allergic reaction  PORTABLE CHEST - 1 VIEW  Comparison: 11/28/2012  Findings: Cardiomediastinal  silhouette is stable.  No acute infiltrate or pleural effusion.  No pulmonary edema.  Stable degenerative changes thoracic spine.  Mild hyperinflation again noted.  IMPRESSION:  No active disease.  Borderline cardiomegaly.   Original Report Authenticated By: Natasha Mead, M.D.     Medications: Scheduled Meds:   . albuterol  2.5 mg Nebulization BID  . [COMPLETED] albuterol  5 mg Nebulization Once  . [COMPLETED] albuterol  10 mg/hr Nebulization Once  . allopurinol  100 mg Oral Daily  . amLODipine  10 mg Oral Daily  . aspirin  81 mg Oral Daily  . azithromycin  500 mg Oral QHS  . carvedilol  6.25 mg Oral BID WC  . cefTRIAXone (ROCEPHIN)  IV  1 g Intravenous Q24H  . colchicine  0.6 mg Oral Daily  . [COMPLETED] diphenhydrAMINE  50 mg Intramuscular Once  . enoxaparin (LOVENOX) injection  40 mg Subcutaneous QHS  . [COMPLETED] famotidine (PEPCID) IV  20 mg Intravenous Once  . fenofibrate  54 mg Oral Daily  . [COMPLETED] ipratropium  0.5 mg Nebulization Once  . ipratropium  0.5 mg Nebulization BID  . isosorbide-hydrALAZINE  1 tablet Oral TID  . [COMPLETED] methylPREDNISolone (SOLU-MEDROL) injection  125 mg Intravenous Once  . methylPREDNISolone (SOLU-MEDROL) injection  40 mg Intravenous Q6H  . mometasone-formoterol  2 puff Inhalation BID  . [DISCONTINUED] albuterol  2.5 mg Nebulization Q4H  . [DISCONTINUED] albuterol  2.5 mg Nebulization Q6H  . [DISCONTINUED] albuterol  2.5 mg Nebulization BID  . [DISCONTINUED] diphenhydrAMINE  25 mg Intravenous Once  . [DISCONTINUED] enoxaparin (LOVENOX) injection  30 mg Subcutaneous Q24H  . [DISCONTINUED] ipratropium  0.5 mg Nebulization Q6H  . [DISCONTINUED] methylPREDNISolone (SOLU-MEDROL) injection  80 mg Intravenous Q6H   Continuous Infusions:  PRN Meds:.acetaminophen, acetaminophen, albuterol, ondansetron (ZOFRAN) IV, ondansetron, polyethylene glycol  Assessment/Plan:  1. COPD exacerbation VS reactive airway disease related to allergic reaction   Improved with IV solumedrol and nebs, continue IV steroids today, Rocephin/zithromax 5 days total  O2 supplementation Quit smoking recently  2. Urticarial rash: likely drug allergy, improved with steroids, levaquin listed as allergy  3. DM (diabetes mellitus): doubt this diagnosis, steroid induced hyperglycemia likely, SSI for now, Hbaic 5.0 last admission  4. CHF: compensated, continue BB, bidil  5. DVT proph: lovenox  FULL CODE  Disposition: inpatient , home tomorrow      Time spent coordinating care:   LOS: 1 day   [facility] Pager: [phone] 12/07/2012, 1:38 PM

## 2012-12-08 DIAGNOSIS — R739 Hyperglycemia, unspecified: Secondary | ICD-10-CM | POA: Diagnosis present

## 2012-12-08 DIAGNOSIS — I2510 Atherosclerotic heart disease of native coronary artery without angina pectoris: Secondary | ICD-10-CM

## 2012-12-08 MED ORDER — PREDNISONE 50 MG PO TABS
50.0000 mg | ORAL_TABLET | Freq: Every day | ORAL | Status: DC
  Administered 2012-12-08: 50 mg via ORAL
  Filled 2012-12-08 (×2): qty 1

## 2012-12-08 MED ORDER — PREDNISONE (PAK) 10 MG PO TABS: 40.0000 mg | ORAL_TABLET | Freq: Every day | ORAL | Status: DC

## 2012-12-08 MED ORDER — CEFPODOXIME PROXETIL 200 MG PO TABS: 200.0000 mg | ORAL_TABLET | Freq: Two times a day (BID) | ORAL | Status: DC

## 2012-12-08 NOTE — Progress Notes (Signed)
1400 discharge instructions and prescription given to pt . Verbalized understanding .

## 2012-12-08 NOTE — Care Management Note (Signed)
    Page 1 of 2   12/08/2012     12:15:26 PM   CARE MANAGEMENT NOTE 12/08/2012  Patient:  John [name], John [name]   Account Number:  [id number]  Date Initiated:  12/08/2012  Documentation initiated by:  Tera Mater  Subjective/Objective Assessment:   66yo male admitted with Allergic Reaction and COPD exacerbation.     Action/Plan:   Pt. lives in [place] alone. Pt. has assistance from [place] and daughter. Pt. was being seen by [facility] prior to admission for [facility] PT/OT/RN and CSW.  Pt. has home continuous oxygen with [facility] as well.   Anticipated DC Date:  12/08/2012   Anticipated DC Plan:  HOME W HOME HEALTH SERVICES      DC Planning Services  CM consult      [facility] Choice  Resumption Of Svcs/PTA Provider   Choice offered to / List presented to:          [facility] arranged  HH-1 RN  HH-2 PT  HH-3 OT  HH-6 SOCIAL WORKER      HH agency  [facility]   Status of service:  Completed, signed off Medicare Important Message given?   (If response is "NO", the following Medicare IM given date fields will be blank) Date Medicare IM given:   Date Additional Medicare IM given:    Discharge Disposition:  HOME W HOME HEALTH SERVICES  Per UR Regulation:  Reviewed for med. necessity/level of care/duration of stay  If discussed at Long Length of Stay Meetings, dates discussed:    Comments:  11/1012 1200 In to speak with pt. about PCP providers. Gave pt. a list of community clinics here in [place], such as [facility] [employer] and the [employer] [facility] [employer].  Pt. was appreciative of information and stated he would ask his daughter to try to set up an appointment for him and he would call [facility] to come get  him to take him to his appointment.  Pt. to dc home today with [facility] services from [facility]. Tera Mater, RN, BSN NCM [phone]

## 2012-12-08 NOTE — Discharge Summary (Signed)
Physician Discharge Summary  Patient ID: John [name] MRN: [id number] DOB/AGE: 1946/05/28 66 y.o.  Admit date: 12/06/2012 Discharge date: 12/08/2012  Primary Care Physician: John [name]   Discharge Diagnoses:    COPD exacerbation  HTN (hypertension)  Allergic drug rash  Drug-induced hyperglycemia  CHF exacerbation  Hyperglycemia induced by steroids  CAD     Medication List     As of 12/08/2012  6:48 PM    STOP taking these medications         indomethacin 50 MG capsule   Commonly known as: INDOCIN      levofloxacin 750 MG tablet   Commonly known as: LEVAQUIN      TAKE these medications         albuterol 108 (90 BASE) MCG/ACT inhaler   Commonly known as: PROVENTIL HFA;VENTOLIN HFA   Inhale 2 puffs into the lungs every 4 (four) hours as needed for wheezing.      allopurinol 100 MG tablet   Commonly known as: ZYLOPRIM   Take 100 mg by mouth daily.      amLODipine 10 MG tablet   Commonly known as: NORVASC   Take 1 tablet (10 mg total) by mouth daily.      aspirin 81 MG chewable tablet   Chew 81 mg by mouth daily.      carvedilol 6.25 MG tablet   Commonly known as: COREG   Take 1 tablet (6.25 mg total) by mouth 2 (two) times daily with a meal.      cefpodoxime 200 MG tablet   Commonly known as: VANTIN   Take 1 tablet (200 mg total) by mouth 2 (two) times daily. For 3 days      colchicine 0.6 MG tablet   Take 1 tablet (0.6 mg total) by mouth daily.      fenofibrate 48 MG tablet   Commonly known as: TRICOR   Take 1 tablet (48 mg total) by mouth daily.      Fluticasone-Salmeterol 500-50 MCG/DOSE Aepb   Commonly known as: ADVAIR   Inhale 1 puff into the lungs every 12 (twelve) hours.      furosemide 20 MG tablet   Commonly known as: LASIX   Take 1 tablet (20 mg total) by mouth daily.      isosorbide-hydrALAZINE 20-37.5 MG per tablet   Commonly known as: BIDIL   Take 1 tablet by mouth 3 (three) times daily.      polyethylene glycol packet   Commonly known as: MIRALAX / GLYCOLAX   Take 17 g by mouth daily.      predniSONE 10 MG tablet   Commonly known as: STERAPRED UNI-PAK   Take 4 tablets (40 mg total) by mouth daily. Decrease dose by 10 mg every day.   Take 40 mg daily for 1 day  Take 30 mg daily for 1 day  Take 20 mg daily for 1 day  Take 10mg  for 1 day then STOP  Take 30 mg daily for 2 days   Take 20 mg daily for 2 days  Take 10 mg daily for 2 days  Then stop.      spironolactone 25 MG tablet   Commonly known as: ALDACTONE   Take 0.5 tablets (12.5 mg total) by mouth daily.         Disposition and Follow-up:  PCP in 1-2 weeks   Significant Diagnostic Studies:  No results found.  Brief H and P: [name] is a 66/M with h/o COPD, CHF, DM was  just discharged from [facility] after treatment for COPD exacerbation on 12/2 on Levaquin, prednisone taper and inhalers, he was breathing well when he went home, apparently just got these new medications on Friday and started them, woke up this morning with a rash/hives on his arms and chest and subsequently started feeling short of breath, with wheezing and came to the ER. He was in resp distress on initial presentation without stridor, improved after nebs and steroids   Hospital Course:  1. COPD exacerbation VS reactive airway disease related to allergic reaction  Improved with IV solumedrol and nebs, transitioned to oraal prednisone Rocephin/zithromax changed to oral antibiotics. O2 supplementation  Quit smoking recently  2. Urticarial rash: likely drug allergy, improved with steroids, levaquin listed as allergy  3. DM (diabetes mellitus): doubt this diagnosis, steroid induced hyperglycemia likely, SSI for now, Hbaic 5.0 last admission  4. CHF: compensated, continue BB, bidil  5. DVT proph: lovenox     Time spent on Discharge:  Signed: Deny Chevez [facility]  12/08/2012, 6:48 PM

## 2012-12-14 ENCOUNTER — Encounter (HOSPITAL_COMMUNITY): Payer: Self-pay

## 2012-12-14 ENCOUNTER — Emergency Department (INDEPENDENT_AMBULATORY_CARE_PROVIDER_SITE_OTHER)
Admission: EM | Admit: 2012-12-14 | Discharge: 2012-12-14 | Disposition: A | Discharge status: Home / Self Care | Payer: Medicare Other | Source: Home / Self Care

## 2012-12-14 DIAGNOSIS — I519 Heart disease, unspecified: Secondary | ICD-10-CM

## 2012-12-14 DIAGNOSIS — J449 Chronic obstructive pulmonary disease, unspecified: Secondary | ICD-10-CM

## 2012-12-14 DIAGNOSIS — I2789 Other specified pulmonary heart diseases: Secondary | ICD-10-CM

## 2012-12-14 DIAGNOSIS — I509 Heart failure, unspecified: Secondary | ICD-10-CM

## 2012-12-14 DIAGNOSIS — I2510 Atherosclerotic heart disease of native coronary artery without angina pectoris: Secondary | ICD-10-CM

## 2012-12-14 DIAGNOSIS — I10 Essential (primary) hypertension: Secondary | ICD-10-CM

## 2012-12-14 DIAGNOSIS — I272 Pulmonary hypertension, unspecified: Secondary | ICD-10-CM

## 2012-12-14 NOTE — ED Notes (Signed)
Follow up from hospital stay- discharged 12/09/12 history of breathing problems

## 2012-12-14 NOTE — ED Provider Notes (Signed)
Patient Demographics  John [name], is a 66 y.o. male  ZOX:[id number]  [id number]:[id number]  DOB - 1946-11-28  Chief Complaint  Patient presents with  . Follow-up        Subjective:   John [name] today has, No headache, No chest pain, No abdominal pain - No Nausea, No new weakness tingling or numbness, No Cough. John [name] was just discharged from the hospital last week for a presumed COPD exacerbation. John [name] has severe COPD and is on home O2, he also has significant pulmonary hypertension and cor pulmonale from his underlying lung issues. Today he says he has no complaints and feels really good. He has been using his oxygen regularly. He has quit smoking. He is here for a followup visit and claims that he has all his medications enough to last him for a few months.  Objective:    Filed Vitals:   12/14/12 1056  BP: 116/72  Pulse: 81  Temp: 98.6 F (37 C)  TempSrc: Oral  Resp: 19  SpO2: 96%     Exam  Awake Alert, Oriented X 3, No new F.N deficits, Normal affect [place].AT,PERRAL Supple Neck,No JVD, No cervical lymphadenopathy appriciated.  Symmetrical Chest wall movement, Good air movement bilaterally, CTAB RRR,No Gallops,Rubs or new Murmurs, No Parasternal Heave +ve B.Sounds, Abd Soft, Non tender, No organomegaly appriciated, No rebound - guarding or rigidity. No Cyanosis, Clubbing or edema, No new Rash or bruise     Data Review   CBC No results found for this basename: WBC:5,HGB:5,HCT:5,PLT:5,MCV:5,MCH:5,MCHC:5,RDW:5,NEUTRABS:5,LYMPHSABS:5,MONOABS:5,EOSABS:5,BASOSABS:5,BANDABS:5,BANDSABD:5 in the last 168 hours  Chemistries   No results found for this basename: NA:5,K:5,CL:5,CO2:5,GLUCOSE:5,BUN:5,CREATININE:5,GFRCGP,:5,CALCIUM:5,MG:5,AST:5,ALT:5,ALKPHOS:5,BILITOT:5 in the last 168 hours ------------------------------------------------------------------------------------------------------------------ No results found for this basename: HGBA1C:2 in the last 72  hours ------------------------------------------------------------------------------------------------------------------ No results found for this basename: CHOL:2,HDL:2,LDLCALC:2,TRIG:2,CHOLHDL:2,LDLDIRECT:2 in the last 72 hours ------------------------------------------------------------------------------------------------------------------ No results found for this basename: TSH,T4TOTAL,FREET3,T3FREE,THYROIDAB in the last 72 hours ------------------------------------------------------------------------------------------------------------------ No results found for this basename: VITAMINB12:2,FOLATE:2,FERRITIN:2,TIBC:2,IRON:2,RETICCTPCT:2 in the last 72 hours  Coagulation profile  No results found for this basename: INR:5,PROTIME:5 in the last 168 hours     Prior to Admission medications   Medication Sig Start Date End Date Taking? Authorizing Provider  albuterol (VENTOLIN HFA) 108 (90 BASE) MCG/ACT inhaler Inhale 2 puffs into the lungs every 4 (four) hours as needed for wheezing. 11/30/12 11/30/13  Stephani Police, PA  allopurinol (ZYLOPRIM) 100 MG tablet Take 100 mg by mouth daily.    Historical Provider, MD  amLODipine (NORVASC) 10 MG tablet Take 1 tablet (10 mg total) by mouth daily. 11/30/12   Stephani Police, PA  aspirin 81 MG chewable tablet Chew 81 mg by mouth daily.    Historical Provider, MD  carvedilol (COREG) 6.25 MG tablet Take 1 tablet (6.25 mg total) by mouth 2 (two) times daily with a meal. 11/30/12   Stephani Police, PA  cefpodoxime (VANTIN) 200 MG tablet Take 1 tablet (200 mg total) by mouth 2 (two) times daily. For 3 days 12/08/12   Zannie Cove, MD  colchicine 0.6 MG tablet Take 1 tablet (0.6 mg total) by mouth daily. 11/30/12   Stephani Police, PA  fenofibrate (TRICOR) 48 MG tablet Take 1 tablet (48 mg total) by mouth daily. 11/30/12   Stephani Police, PA  Fluticasone-Salmeterol (ADVAIR) 500-50 MCG/DOSE AEPB Inhale 1 puff into the lungs every 12 (twelve) hours. 11/30/12    Stephani Police, PA  furosemide (LASIX) 20 MG tablet Take 1 tablet (20 mg total) by mouth daily. 11/30/12   Tora Kindred  [place], PA  isosorbide-hydrALAZINE (BIDIL) 20-37.5 MG per tablet Take 1 tablet by mouth 3 (three) times daily. 11/30/12   Stephani Police, PA  polyethylene glycol (MIRALAX / GLYCOLAX) packet Take 17 g by mouth daily. 11/30/12   Tora Kindred [place], PA  predniSONE (STERAPRED UNI-PAK) 10 MG tablet Take 4 tablets (40 mg total) by mouth daily. Decrease dose by 10 mg every day.  Take 40 mg daily for 1 day Take 30 mg daily for 1 day Take 20 mg daily for 1 day Take 10mg  for 1 day then STOP Take 30 mg daily for 2 days  Take 20 mg daily for 2 days Take 10 mg daily for 2 days Then stop. 12/08/12   Zannie Cove, MD  spironolactone (ALDACTONE) 25 MG tablet Take 0.5 tablets (12.5 mg total) by mouth daily. 11/30/12 11/30/13  Stephani Police, PA     Assessment & Plan  Hospital follow up visit - Doing good, lungs are clear no leg swelling on exam. We'll continue on his usual medications.  COPD on home oxygen - Continue with his inhalers and oxygen - Follow up appointment with Dr. Sherene Sires made for 12/20  CHF - Seems currently compensated - Not a candidate for ACE inhibitors-history of angioedema - Follow appointment at the heart failure clinic made for 12/30  Health maintenance - Claims has had a flu shot when he was hospitalized  Hypertension - We will continue with current medication  Gout - Stable continue current medications  Rest of his medical issues seem stable  Will see him in the clinic in one month after he has been seen by cardiology and pulmonology.   Follow-up Information    Follow up with Sandrea Hughs, MD. On 12/18/2012. (appointment at 2:15 pm)    Contact information:   [address] [place] [address] [phone]       Follow up with [facility]. On 12/28/2012. (appt at 2pm)    Contact information:   heart  failure clinic      Follow up with [facility]. In 1 month.          Jeoffrey Massed M.D on 12/14/2012 at 11:50 AM   Maretta Bees, MD 12/14/12 1150

## 2012-12-18 ENCOUNTER — Encounter: Payer: Self-pay | Admitting: Internal Medicine

## 2012-12-18 ENCOUNTER — Ambulatory Visit (INDEPENDENT_AMBULATORY_CARE_PROVIDER_SITE_OTHER): Payer: Medicare Other | Admitting: Internal Medicine

## 2012-12-18 VITALS — BP 110/60 | HR 70 | Temp 98.2°F | Ht 67.5 in | Wt 183.0 lb

## 2012-12-18 DIAGNOSIS — J9611 Chronic respiratory failure with hypoxia: Secondary | ICD-10-CM | POA: Insufficient documentation

## 2012-12-18 DIAGNOSIS — J9610 Chronic respiratory failure, unspecified whether with hypoxia or hypercapnia: Secondary | ICD-10-CM

## 2012-12-18 DIAGNOSIS — J449 Chronic obstructive pulmonary disease, unspecified: Secondary | ICD-10-CM

## 2012-12-18 MED ORDER — FLUTICASONE-SALMETEROL 250-50 MCG/DOSE IN AEPB: 1.0000 | INHALATION_SPRAY | Freq: Two times a day (BID) | RESPIRATORY_TRACT | Status: DC

## 2012-12-18 NOTE — Patient Instructions (Addendum)
Change advair to 250/50 one twice daily  Please schedule a follow up office visit in 6 weeks, call sooner if needed with pfts  Late add: Chart review indicates freq er use Therefore change f/u to: See Tammy NP w/in 2 weeks with all your medications, even over the counter meds, separated in two separate bags, the ones you take no matter what vs the ones you stop once you feel better and take only as needed when you feel you need them.   Tammy  will generate for you a new user friendly medication calendar that will put [place] all on the same page re: your medication use.     Without this process, it simply isn't possible to assure that we are providing  your outpatient care  with  the attention to detail we feel you deserve.   If we cannot assure that you're getting that kind of care,  then we cannot manage your problem effectively from this clinic.  Once you have seen Tammy and we are sure that we're all on the same page with your medication use she will arrange follow up with me.

## 2012-12-18 NOTE — Progress Notes (Signed)
  Subjective:    Patient ID: John [name], male    DOB: 1946/09/02   MRN: [id number]  HPI  32 yobm quit smoking 09/2012 with multiple admits for "aecopd" last one:  Admit date: 12/06/2012  Discharge date: 12/08/2012   Discharge Diagnoses:  COPD exacerbation  HTN (hypertension)  Allergic drug rash  Drug-induced hyperglycemia  CHF exacerbation  Hyperglycemia induced by steroids  CAD    12/18/2012 1st pulmonary ov/ John [name] cc doe s 02 up to half a mile s stopping and overall using 02 mostly evening before bed on advair 500/50 q am only and does not know how to use it correctly. Not needing any daytime saba but generally confused on how to use meds.   Presently No obvious daytime variabilty or assoc chronic cough or cp or chest tightness, subjective wheeze overt sinus or hb symptoms. No unusual exp hx or h/o childhood pna/ asthma or premature birth to his knowledge.   Sleeping ok without nocturnal  or early am exacerbation  of respiratory  c/o's or need for noct saba. Also denies any obvious fluctuation of symptoms with weather or environmental changes or other aggravating or alleviating factors except as outlined above   Review of Systems  Constitutional: Negative for fever, chills, activity change, appetite change and unexpected weight change.  HENT: Negative for congestion, sore throat, rhinorrhea, sneezing, trouble swallowing, dental problem, voice change and postnasal drip.   Eyes: Negative for visual disturbance.  Respiratory: Positive for cough and shortness of breath. Negative for choking.   Cardiovascular: Negative for chest pain and leg swelling.  Gastrointestinal: Negative for nausea, vomiting and abdominal pain.  Genitourinary: Negative for difficulty urinating.  Musculoskeletal: Negative for arthralgias.  Skin: Negative for rash.  Psychiatric/Behavioral: Negative for behavioral problems and confusion.       Objective:   Physical Exam  amb min hoarse bm nad Wt  Readings from Last 3 Encounters:  12/18/12 183 lb (83.008 kg)  12/08/12 180 lb 6.4 oz (81.829 kg)  11/30/12 180 lb 12.4 oz (82 kg)    HEENT mild turbinate edema.  Oropharynx no thrush or excess pnd or cobblestoning. Edentulous with dentures in place No JVD or cervical adenopathy. Mild accessory muscle hypertrophy. Trachea midline, nl thryroid. Chest was hyperinflated by percussion with diminished breath sounds and moderate increased exp time without wheeze. Hoover sign positive at mid inspiration. Regular rate and rhythm without murmur gallop or rub or increase P2 or edema.  Abd: no hsm, nl excursion. Ext warm without cyanosis or clubbing.    12/06/12 cxr No active disease. Borderline cardiomegaly.       Assessment & Plan:

## 2012-12-19 DIAGNOSIS — J449 Chronic obstructive pulmonary disease, unspecified: Secondary | ICD-10-CM | POA: Insufficient documentation

## 2012-12-19 NOTE — Assessment & Plan Note (Addendum)
Appears to be doing generally better since reported smoking cessation but very poor insight into meds  DDX of  difficult airways managment all start with A and  include Adherence, Ace Inhibitors, Acid Reflux, Active Sinus Disease, Alpha 1 Antitripsin deficiency, Anxiety masquerading as Airways dz,  ABPA,  allergy(esp in young), Aspiration (esp in elderly), Adverse effects of DPI,  Active smokers, plus two Bs  = Bronchiectasis and Beta blocker use.(note he's on Coreg, albeit low doses s active wheeze).and one C= CHF   Adherence is always the initial "prime suspect" and is a multilayered concern that requires a "trust but verify" approach in every patient - starting with knowing how to use medications, especially inhalers, correctly, keeping up with refills and understanding the fundamental difference between maintenance and prns vs those medications only taken for a very short course and then stopped and not refilled. The proper method of use, as well as anticipated side effects, of a metered-dose inhaler are discussed and demonstrated to the patient. Improved effectiveness after extensive coaching during this visit to a level of approximately  90% from a baseline of < 25% so if he uses advair correctly he should be able to just use the 250 bid dose (samples given).   To keep things simple, I have asked the patient to first separate medicines that are perceived as maintenance, that is to be taken daily "no matter what", from those medicines that are taken on only on an as-needed basis and I have given the patient examples of both, and then return to see our NP to generate a  detailed  medication calendar which should be followed until the next physician sees the patient and updates it.

## 2012-12-19 NOTE — Assessment & Plan Note (Signed)
-   12/18/2012  Walked RA  2 laps @ 185 ft each stopped due to  desat 87%  No need for 02 at rest or room to room but should wear the portable 02 when leaves the house. Will provide lightweight system

## 2012-12-21 ENCOUNTER — Telehealth: Payer: Self-pay | Admitting: *Deleted

## 2012-12-21 NOTE — Telephone Encounter (Signed)
Spoke with Melissa and she states that MW already signed the order that she needed Nothing further needed

## 2012-12-21 NOTE — Telephone Encounter (Deleted)
Pt is already scheduled for/

## 2012-12-28 ENCOUNTER — Ambulatory Visit (HOSPITAL_COMMUNITY): Admit: 2012-12-28 | Payer: Medicare Other

## 2013-01-01 ENCOUNTER — Encounter: Payer: Medicare Other | Admitting: Adult Health

## 2013-01-05 ENCOUNTER — Encounter: Payer: Medicare Other | Admitting: Adult Health

## 2013-01-11 ENCOUNTER — Encounter: Payer: Self-pay | Admitting: Adult Health

## 2013-01-11 ENCOUNTER — Ambulatory Visit (INDEPENDENT_AMBULATORY_CARE_PROVIDER_SITE_OTHER): Payer: Medicare Other | Admitting: Adult Health

## 2013-01-11 VITALS — BP 130/82 | HR 65 | Temp 97.0°F | Ht 67.5 in | Wt 197.6 lb

## 2013-01-11 DIAGNOSIS — J449 Chronic obstructive pulmonary disease, unspecified: Secondary | ICD-10-CM

## 2013-01-11 NOTE — Progress Notes (Signed)
  Subjective:    Patient ID: ENES ROKOSZ, male    DOB: 01/10/1946   MRN: [id number]  HPI  53 yobm quit smoking 09/2012 with multiple admits for "aecopd" last one:  Admit date: 12/06/2012  Discharge date: 12/08/2012   Discharge Diagnoses:  COPD exacerbation  HTN (hypertension)  Allergic drug rash  Drug-induced hyperglycemia  CHF exacerbation  Hyperglycemia induced by steroids  CAD    12/18/2012 1st pulmonary ov/ Wert cc doe s 02 up to half a mile s stopping and overall using 02 mostly evening before bed on advair 500/50 q am only and does not know how to use it correctly. Not needing any daytime saba but generally confused on how to use meds.   Presently No obvious daytime variabilty or assoc chronic cough or cp or chest tightness, subjective wheeze overt sinus or hb symptoms. No unusual exp hx or h/o childhood pna/ asthma or premature birth to his knowledge.  >change to Advair 250/50    01/11/2013 Follow up and medication review  She returns for one-month followup and medication review. We reviewed all his medications and organized them into a medication calendar with patient education. It appears the patient is taking his medications Feels so much on Advair with decreased cough and shortness of breath. No wheezing.  Patient denies any hemoptysis, orthopnea, PND, or leg swelling    Review of Systems  Constitutional: Negative for fever, chills, activity change, appetite change and unexpected weight change.  HENT: Negative for congestion, sore throat, rhinorrhea, sneezing, trouble swallowing, dental problem, voice change and postnasal drip.   Eyes: Negative for visual disturbance.  Respiratory: Positive for cough and shortness of breath. Negative for choking.   Cardiovascular: Negative for chest pain and leg swelling.  Gastrointestinal: Negative for nausea, vomiting and abdominal pain.  Genitourinary: Negative for difficulty urinating.  Musculoskeletal: Negative for  arthralgias.  Skin: Negative for rash.  Psychiatric/Behavioral: Negative for behavioral problems and confusion.       Objective:   Physical Exam  HEENT mild turbinate edema.  Oropharynx no thrush or excess pnd or cobblestoning. Edentulous with dentures in place No JVD or cervical adenopathy. Mild accessory muscle hypertrophy. Trachea midline, nl thryroid. Chest was hyperinflated by percussion with diminished breath sounds and moderate increased exp time without wheeze.  Regular rate and rhythm without murmur gallop or rub or increase P2 or edema.  Abd: no hsm, nl excursion. Ext warm without cyanosis or clubbing.    12/06/12 cxr No active disease. Borderline cardiomegaly.       Assessment & Plan:

## 2013-01-11 NOTE — Assessment & Plan Note (Signed)
Improved control on Advair. Patient's medications were reviewed today and patient education was given. Computerized medication calendar was adjusted/completed follow up 3 weeks with PFT as planned

## 2013-01-11 NOTE — Patient Instructions (Addendum)
Follow med calendar closely and bring to each visit Continue on current regimen. Followup in 3 weeks as planned with Dr. Sherene Sires and As needed

## 2013-01-12 NOTE — Addendum Note (Signed)
Addended by: Boone Master E on: 01/12/2013 02:56 PM   Modules accepted: Orders

## 2013-01-14 ENCOUNTER — Encounter: Payer: Self-pay | Admitting: Internal Medicine

## 2013-02-05 ENCOUNTER — Ambulatory Visit: Payer: Medicare Other | Admitting: Internal Medicine

## 2013-02-05 ENCOUNTER — Ambulatory Visit (INDEPENDENT_AMBULATORY_CARE_PROVIDER_SITE_OTHER): Payer: Medicare Other | Admitting: Internal Medicine

## 2013-02-05 DIAGNOSIS — J449 Chronic obstructive pulmonary disease, unspecified: Secondary | ICD-10-CM

## 2013-02-05 LAB — PULMONARY FUNCTION TEST

## 2013-02-05 NOTE — Progress Notes (Signed)
PFT done today. 

## 2013-02-08 ENCOUNTER — Encounter: Payer: Self-pay | Admitting: Internal Medicine

## 2013-02-10 ENCOUNTER — Ambulatory Visit: Payer: Medicare Other | Admitting: Internal Medicine

## 2013-02-23 ENCOUNTER — Telehealth: Payer: Self-pay | Admitting: Internal Medicine

## 2013-02-23 NOTE — Telephone Encounter (Signed)
Give enough samples until next ov because we'll need to train him on how to use symbicort (move up appt to < 6 weeks if needed but make sure he has enough advair samples to last)

## 2013-02-23 NOTE — Telephone Encounter (Signed)
[employer] ID # [id number].  Advair 250/50 is no longer covered. [employer]'s computers are down and we will need to call back later to initiate PA.

## 2013-02-23 NOTE — Telephone Encounter (Signed)
Per [employer], Symbicort and Elwin Sleight are preferred medications. MW, pls advise if one of these would be an appropriate alternative for the pt.

## 2013-02-23 NOTE — Telephone Encounter (Signed)
lmomtcb for pt.  Will keep in triage to f/u on as msg created by Lawson Fiscal.

## 2013-02-26 ENCOUNTER — Encounter: Payer: Self-pay | Admitting: Internal Medicine

## 2013-02-26 NOTE — Telephone Encounter (Signed)
LMTCBx2 for the pt to give samples and to schedule an appt. Carron Curie, CMA

## 2013-03-01 ENCOUNTER — Ambulatory Visit (INDEPENDENT_AMBULATORY_CARE_PROVIDER_SITE_OTHER): Payer: Medicare Other | Admitting: Internal Medicine

## 2013-03-01 ENCOUNTER — Encounter: Payer: Self-pay | Admitting: Internal Medicine

## 2013-03-01 VITALS — BP 160/80 | HR 60 | Temp 97.6°F | Ht 67.0 in | Wt 207.2 lb

## 2013-03-01 DIAGNOSIS — J9610 Chronic respiratory failure, unspecified whether with hypoxia or hypercapnia: Secondary | ICD-10-CM

## 2013-03-01 MED ORDER — FLUTICASONE-SALMETEROL 250-50 MCG/DOSE IN AEPB: 1.0000 | INHALATION_SPRAY | Freq: Two times a day (BID) | RESPIRATORY_TRACT | Status: DC

## 2013-03-01 NOTE — Assessment & Plan Note (Addendum)
-   PFT's 02/05/13 FEV1  1.15 (41%) ratio 48 and 22% better p B2,  DLCO  61 corrects to 81%  GOLD III copd Adequate control on present rx, reviewed   I had an extended discussion with the patient today lasting 15 to 20 minutes of a 25 minute visit on the following issues:   Each maintenance medication was reviewed in detail including most importantly the difference between maintenance and as needed and under what circumstances the prns are to be used. This was done in the context of a medication calendar review which provided the patient with a user-friendly unambiguous mechanism for medication administration and reconciliation and provides an action plan for all active problems. It is critical that this be shown to every doctor  for modification during the office visit if necessary so the patient can use it as a working document.      The proper method of use, as well as anticipated side effects, of a metered-dose inhaler are discussed and demonstrated to the patient. Improved effectiveness after extensive coaching during this visit to a level of approximately  90% with mdi, 50% with mdi

## 2013-03-01 NOTE — Assessment & Plan Note (Signed)
-   12/18/2012  Walked RA  2 laps @ 185 ft each stopped due to  desat 87%  rx = no need for 02, room to room, use at hs and use when outside

## 2013-03-01 NOTE — Patient Instructions (Addendum)
Advair 250/50 one twice daily = Plan A  Only use your albuterol (ventolin = Plan B)   as a rescue medication to be used if you can't catch your breath by resting or doing a relaxed purse lip breathing pattern. The less you use it, the better it will work when you need it.    Please see patient coordinator before you leave today  to schedule portable 02   Please schedule a follow up visit in 3 months but call sooner if needed

## 2013-03-01 NOTE — Progress Notes (Signed)
  Subjective:    Patient ID: John [name], male    DOB: 09/04/1946   MRN: [id number]  HPI  50 yobm quit smoking 09/2012 with multiple admits for "aecopd" last one:  Admit date: 12/06/2012  Discharge date: 12/08/2012   Discharge Diagnoses:  COPD exacerbation  HTN (hypertension)  Allergic drug rash  Drug-induced hyperglycemia  CHF exacerbation  Hyperglycemia induced by steroids  CAD    12/18/2012 1st pulmonary ov/ Wert cc doe s 02 up to half a mile s stopping and overall using 02 mostly evening before bed on advair 500/50 q am only and does not know how to use it correctly. Not needing any daytime saba but generally confused on how to use meds.  >change to Advair 250/50    01/11/2013 Follow up and medication review  She returns for one-month followup and medication review. We reviewed all his medications and organized them into a medication calendar with patient education. It appears the patient is taking his medications Better on  Advair with decreased cough and shortness of breath. No wheezing.  rec No change rx  Use med calendar   03/01/2013 f/u ov/Wert cc confused with meds/ med calendar using advair 500 and confused with how to use hfa albuterol but overall doing better.  No obvious daytime variabilty or assoc chronic cough or cp or chest tightness, subjective wheeze overt sinus or hb symptoms. No unusual exp hx    Sleeping ok without nocturnal  or early am exacerbation  of respiratory  c/o's or need for noct saba. Also denies any obvious fluctuation of symptoms with weather or environmental changes or other aggravating or alleviating factors except as outlined above   ROS  The following are not active complaints unless bolded sore throat, dysphagia, dental problems, itching, sneezing,  nasal congestion or excess/ purulent secretions, ear ache,   fever, chills, sweats, unintended wt loss, pleuritic or exertional cp, hemoptysis,  orthopnea pnd or leg swelling, presyncope,  palpitations, heartburn, abdominal pain, anorexia, nausea, vomiting, diarrhea  or change in bowel or urinary habits, change in stools or urine, dysuria,hematuria,  rash, arthralgias, visual complaints, headache, numbness weakness or ataxia or problems with walking or coordination,  change in mood/affect or memory.              Objective:   Physical Exam  Wt Readings from Last 3 Encounters:  03/01/13 207 lb 3.2 oz (93.985 kg)  01/11/13 197 lb 9.6 oz (89.631 kg)  12/18/12 183 lb (83.008 kg)     HEENT mild turbinate edema.  Oropharynx no thrush or excess pnd or cobblestoning. Edentulous with dentures in place No JVD or cervical adenopathy. Mild accessory muscle hypertrophy. Trachea midline, nl thryroid. Chest was hyperinflated by percussion with diminished breath sounds and moderate increased exp time without wheeze.  Regular rate and rhythm without murmur gallop or rub or increase P2 or edema.  Abd: no hsm, nl excursion. Ext warm without cyanosis or clubbing.    12/06/12 cxr No active disease. Borderline cardiomegaly.       Assessment & Plan:

## 2013-03-02 NOTE — Telephone Encounter (Signed)
He has an advair sample I gave him - will need to return to the office before this runs out to regroup because it's not easy to use symbicort  - we'll give him samples and make sure he can use it before he leaves

## 2013-03-02 NOTE — Telephone Encounter (Signed)
Called spoke with patient, he stated that he has enough advair to last him another couple of weeks.  Ov w/ MW scheduled for 3.21.14 @ 9am.  Pt to call for sooner appt if needed.  Nothing further needed at this time; will sign off.

## 2013-03-02 NOTE — Telephone Encounter (Signed)
Patient was seen yesterday 3.3.14 by MW: Patient Instructions    Advair 250/50 one twice daily = Plan A  Only use your albuterol (ventolin = Plan B) as a rescue medication to be used if you can't catch your breath by resting or doing a relaxed purse lip breathing pattern. The less you use it, the better it will work when you need it.  Please see patient coordinator before you leave today to schedule portable 02  Please schedule a follow up visit in 3 months but call sooner if needed     Per this message, advair is not covered by pt's insurance Was supposed to be switched to symbicort ??? Please clarify, thanks.

## 2013-03-19 ENCOUNTER — Ambulatory Visit (INDEPENDENT_AMBULATORY_CARE_PROVIDER_SITE_OTHER): Payer: Medicare Other | Admitting: Internal Medicine

## 2013-03-19 ENCOUNTER — Encounter: Payer: Self-pay | Admitting: Internal Medicine

## 2013-03-19 VITALS — BP 138/80 | HR 69 | Ht 67.0 in | Wt 208.0 lb

## 2013-03-19 DIAGNOSIS — J449 Chronic obstructive pulmonary disease, unspecified: Secondary | ICD-10-CM

## 2013-03-19 DIAGNOSIS — J9610 Chronic respiratory failure, unspecified whether with hypoxia or hypercapnia: Secondary | ICD-10-CM

## 2013-03-19 MED ORDER — BUDESONIDE-FORMOTEROL FUMARATE 160-4.5 MCG/ACT IN AERO: INHALATION_SPRAY | RESPIRATORY_TRACT | Status: DC

## 2013-03-19 NOTE — Progress Notes (Signed)
  Subjective:    Patient ID: John [name], male    DOB: October 11, 1946   MRN: [id number]  HPI  11 yobm quit smoking 09/2012 with multiple admits for "aecopd" last one:  Admit date: 12/06/2012  Discharge date: 12/08/2012   Discharge Diagnoses:  COPD exacerbation  HTN (hypertension)  Allergic drug rash  Drug-induced hyperglycemia  CHF exacerbation  Hyperglycemia induced by steroids  CAD    12/18/2012 1st pulmonary ov/ Maeve Debord cc doe s 02 up to half a mile s stopping and overall using 02 mostly evening before bed on advair 500/50 q am only and does not know how to use it correctly. Not needing any daytime saba but generally confused on how to use meds.  >change to Advair 250/50    01/11/2013 Follow up and medication review  She returns for one-month followup and medication review. We reviewed all his medications and organized them into a medication calendar with patient education. It appears the patient is taking his medications Better on  Advair with decreased cough and shortness of breath. No wheezing.  rec No change rx  Use med calendar   03/01/2013 f/u ov/Ronee Ranganathan cc confused with meds/ med calendar using advair 500 and confused with how to use hfa albuterol but overall doing better rec Advair 250/50 one twice daily = Plan A Only use your albuterol (ventolin = Plan B)   Please see patient coordinator before you leave today  to schedule portable 02   03/19/2013 f/u ov/Ellinore Merced cc sob about the same, getting confused with inhalers, mdi still poor at baseline.  Portable 02 helping a little vs before.   No obvious daytime variabilty or assoc chronic cough or cp or chest tightness, subjective wheeze overt sinus or hb symptoms. No unusual exp hx    Sleeping ok without nocturnal  or early am exacerbation  of respiratory  c/o's or need for noct saba. Also denies any obvious fluctuation of symptoms with weather or environmental changes or other aggravating or alleviating factors except as outlined  above   ROS  The following are not active complaints unless bolded sore throat, dysphagia, dental problems, itching, sneezing,  nasal congestion or excess/ purulent secretions, ear ache,   fever, chills, sweats, unintended wt loss, pleuritic or exertional cp, hemoptysis,  orthopnea pnd or leg swelling, presyncope, palpitations, heartburn, abdominal pain, anorexia, nausea, vomiting, diarrhea  or change in bowel or urinary habits, change in stools or urine, dysuria,hematuria,  rash, arthralgias, visual complaints, headache, numbness weakness or ataxia or problems with walking or coordination,  change in mood/affect or memory.              Objective:   Physical Exam  03/19/2013  Wt 208 Wt Readings from Last 3 Encounters:  03/01/13 207 lb 3.2 oz (93.985 kg)  01/11/13 197 lb 9.6 oz (89.631 kg)  12/18/12 183 lb (83.008 kg)     HEENT mild turbinate edema.  Oropharynx no thrush or excess pnd or cobblestoning. Edentulous with dentures in place No JVD or cervical adenopathy. Mild accessory muscle hypertrophy. Trachea midline, nl thryroid. Chest was hyperinflated by percussion with diminished breath sounds and moderate increased exp time without wheeze.  Regular rate and rhythm without murmur gallop or rub or increase P2 or edema.  Abd: no hsm, nl excursion. Ext warm without cyanosis or clubbing.    12/06/12 cxr No active disease. Borderline cardiomegaly.       Assessment & Plan:

## 2013-03-19 NOTE — Patient Instructions (Addendum)
Symbicort 160 Take 2 puffs first thing in am and then another 2 puffs about 12 hours later.     Work on inhaler technique:  relax and gently blow all the way out then take a nice smooth deep breath back in, triggering the inhaler at same time you start breathing in.  Hold for up to 5 seconds if you can.  Rinse and gargle with water when done   If your mouth or throat starts to bother you,   I suggest you time the inhaler to your dental care and after using the inhaler(s) brush teeth and tongue with a baking soda containing toothpaste and when you rinse this out, gargle with it first to see if this helps your mouth and throat.    Only use your albuterol (ventolin) as a rescue medication to be used if you can't catch your breath by resting or doing a relaxed purse lip breathing pattern. The less you use it, the better it will work when you need it. Don't leave home without it.   Please schedule a follow up visit in 3 months but call sooner if needed

## 2013-03-20 NOTE — Assessment & Plan Note (Signed)
-   PFT's 02/05/13 FEV1  1.15 (41%) ratio 48 and 22% better p B2,  DLCO  61 corrects to 81% - hfa 75% p coaching 03/19/13   The proper method of use, as well as anticipated side effects, of a metered-dose inhaler are discussed and demonstrated to the patient. Improved effectiveness after extensive coaching during this visit to a level of approximately  75% and insurance won't pay for advair so try symbicort 160 2bid    Each maintenance medication was reviewed in detail including most importantly the difference between maintenance and as needed and under what circumstances the prns are to be used.  Please see instructions for details which were reviewed in writing and the patient given a copy.

## 2013-03-20 NOTE — Assessment & Plan Note (Signed)
-   12/18/2012  Walked RA  2 laps @ 185 ft each stopped due to  desat 87% - 03/19/2013  Walked 2lpm x 3 laps @ 185 ft each stopped due to  End of study, sat 91%  Adequate control on present rx, reviewed rx

## 2013-04-09 ENCOUNTER — Other Ambulatory Visit: Payer: Self-pay | Admitting: Internal Medicine

## 2013-05-06 ENCOUNTER — Other Ambulatory Visit: Payer: Self-pay | Admitting: Internal Medicine

## 2013-05-07 MED ORDER — BUDESONIDE-FORMOTEROL FUMARATE 160-4.5 MCG/ACT IN AERO: INHALATION_SPRAY | RESPIRATORY_TRACT | Status: DC

## 2013-05-07 MED ORDER — ALBUTEROL SULFATE HFA 108 (90 BASE) MCG/ACT IN AERS: 2.0000 | INHALATION_SPRAY | RESPIRATORY_TRACT | Status: DC | PRN

## 2013-06-01 ENCOUNTER — Other Ambulatory Visit: Payer: Self-pay | Admitting: Internal Medicine

## 2013-06-03 ENCOUNTER — Other Ambulatory Visit: Payer: Self-pay | Admitting: Internal Medicine

## 2013-06-10 ENCOUNTER — Other Ambulatory Visit: Payer: Self-pay | Admitting: Internal Medicine

## 2013-06-10 ENCOUNTER — Telehealth: Payer: Self-pay | Admitting: Internal Medicine

## 2013-06-10 NOTE — Telephone Encounter (Signed)
I called pharm. Pt does not need anything refilled in regards to his pulm medications.meds MW refills.   I called and made lynette aware. Nothing further was needed

## 2013-06-17 ENCOUNTER — Encounter: Payer: Self-pay | Admitting: Internal Medicine

## 2013-06-17 ENCOUNTER — Ambulatory Visit (INDEPENDENT_AMBULATORY_CARE_PROVIDER_SITE_OTHER): Payer: Medicare Other | Admitting: Internal Medicine

## 2013-06-17 VITALS — BP 138/78 | HR 77 | Temp 96.6°F | Ht 66.5 in | Wt 209.4 lb

## 2013-06-17 DIAGNOSIS — J4489 Other specified chronic obstructive pulmonary disease: Secondary | ICD-10-CM

## 2013-06-17 DIAGNOSIS — J9610 Chronic respiratory failure, unspecified whether with hypoxia or hypercapnia: Secondary | ICD-10-CM

## 2013-06-17 DIAGNOSIS — J449 Chronic obstructive pulmonary disease, unspecified: Secondary | ICD-10-CM

## 2013-06-17 NOTE — Patient Instructions (Addendum)
Continue symbicort 160 Take 2 puffs first thing in am and then another 2 puffs about 12 hours later  Remember don't leave home without your rescue inhaler  Rec:  02 2lpm sitting and walking outside the house  Please schedule a follow up visit in 3 months but call sooner if needed  Add consider adding tudorza if not satisfied with ex tol

## 2013-06-17 NOTE — Progress Notes (Signed)
Subjective:    Patient ID: John [name], male    DOB: 1946-12-13   MRN: [id number]  HPI  72 yobm quit smoking 09/2012 with multiple admits for "aecopd" last one:  Admit date: 12/06/2012  Discharge date: 12/08/2012   Discharge Diagnoses:  COPD exacerbation  HTN (hypertension)  Allergic drug rash  Drug-induced hyperglycemia  CHF exacerbation  Hyperglycemia induced by steroids  CAD    12/18/2012 1st pulmonary ov/ John [name] cc doe s 02 up to half a mile s stopping and overall using 02 mostly evening before bed on advair 500/50 q am only and does not know how to use it correctly. Not needing any daytime saba but generally confused on how to use meds.  >change to Advair 250/50    01/11/2013 NP Follow up and medication review   We reviewed all his medications and organized them into a medication calendar with patient education. It appears the patient is taking his medications Better on  Advair with decreased cough and shortness of breath. No wheezing.  rec No change rx  Use med calendar   03/01/2013 f/u ov/John [name] cc confused with meds/ med calendar using advair 500 and confused with how to use hfa albuterol but overall doing better rec Advair 250/50 one twice daily = Plan A Only use your albuterol (ventolin = Plan B)   Please see patient coordinator before you leave today  to schedule portable 02   03/19/2013 f/u ov/John [name] cc sob about the same, getting confused with inhalers, mdi still poor at baseline.  Portable 02 helping a little vs before. rec Symbicort 160 Take 2 puffs first thing in am and then another 2 puffs about 12 hours later.  Work on inhaler technique:   .   Only use your albuterol (ventolin) as a rescue medication    06/17/2013 f/u ov/John [name] re copd/ 02 dep but takes it off during the day if sitting at home Chief Complaint  Patient presents with  . Follow-up    Breathing unchanged since the last visit. No new co's today.   not wearing 02 consistently s 02 (mowed grass x  20 m s 02 s stopping self propelled in 2nd gear)   No obvious daytime variabilty or assoc chronic cough or cp or chest tightness, subjective wheeze overt sinus or hb symptoms. No unusual exp hx    Sleeping ok without nocturnal  or early am exacerbation  of respiratory  c/o's or need for noct saba. Also denies any obvious fluctuation of symptoms with weather or environmental changes or other aggravating or alleviating factors except as outlined above   Current Medications, Allergies, Past Medical History, Past Surgical History, Family History, and Social History were reviewed in [employer] record.  ROS  The following are not active complaints unless bolded sore throat, dysphagia, dental problems, itching, sneezing,  nasal congestion or excess/ purulent secretions, ear ache,   fever, chills, sweats, unintended wt loss, pleuritic or exertional cp, hemoptysis,  orthopnea pnd or leg swelling, presyncope, palpitations, heartburn, abdominal pain, anorexia, nausea, vomiting, diarrhea  or change in bowel or urinary habits, change in stools or urine, dysuria,hematuria,  rash, arthralgias, visual complaints, headache, numbness weakness or ataxia or problems with walking or coordination,  change in mood/affect or memory.                  Objective:   Physical Exam  03/19/2013  Wt 208 vs 06/17/2013 209  Wt Readings from Last 3 Encounters:  03/01/13 207  lb 3.2 oz (93.985 kg)  01/11/13 197 lb 9.6 oz (89.631 kg)  12/18/12 183 lb (83.008 kg)     HEENT mild turbinate edema.  Oropharynx no thrush or excess pnd or cobblestoning. Edentulous with dentures in place No JVD or cervical adenopathy. Mild accessory muscle hypertrophy. Trachea midline, nl thryroid. Chest was hyperinflated by percussion with diminished breath sounds and moderate increased exp time without wheeze.  Regular rate and rhythm without murmur gallop or rub or increase P2 or edema.  Abd: no hsm, nl excursion. Ext warm  without cyanosis or clubbing.    12/06/12 cxr No active disease. Borderline cardiomegaly.       Assessment & Plan:

## 2013-06-18 NOTE — Assessment & Plan Note (Signed)
-   PFT's 02/05/13 FEV1  1.15 (41%) ratio 48 and 22% better p B2,  DLCO  61 corrects to 81%    Each maintenance medication was reviewed in detail including most importantly the difference between maintenance and as needed and under what circumstances the prns are to be used.  Please see instructions for details which were reviewed in writing and the patient given a copy.    The proper method of use, as well as anticipated side effects, of a metered-dose inhaler are discussed and demonstrated to the patient. Improved effectiveness after extensive coaching during this visit to a level of approximately  90%   Since he tends to be easily confused with instructions and meds/ will keep it as simple as possible for now and just use symbicort 160 2 bid and prn saba, reviewed.

## 2013-06-18 NOTE — Assessment & Plan Note (Signed)
-   12/18/2012  Walked RA  2 laps @ 185 ft each stopped due to  desat 87% - 03/19/2013  Walked 2lpm x 3 laps @ 185 ft each stopped due to  End of study, sat 91% - 06/17/2013   Walked RA x one lap @ 185 stopped due to desat to 87 when started second lap  Rec:  02 2lpm sitting and walking outside the house

## 2013-06-29 ENCOUNTER — Other Ambulatory Visit: Payer: Self-pay | Admitting: Internal Medicine

## 2013-08-09 ENCOUNTER — Telehealth: Payer: Self-pay | Admitting: Internal Medicine

## 2013-08-09 NOTE — Telephone Encounter (Signed)
Received a fax from pt pharmacy stating that Indomethacin is not covered and requires a PA. Contact # [phone], pt ID# [id number].  I initiated PA and requested a faxed form to front fax. Form completed and placed in MW look-at to sign. I will forward message to Hough to f/u on. Carron Curie, CMA

## 2013-08-11 NOTE — Telephone Encounter (Signed)
LMTCB for the pt 

## 2013-08-11 NOTE — Telephone Encounter (Signed)
Form placed in look-at. Carron Curie, CMA

## 2013-08-11 NOTE — Telephone Encounter (Signed)
Ok to use advil and needs primary care doctor but if doesn't have one set up ov with me or tammy with all meds in hand to bridge the gap until he gets one

## 2013-08-23 NOTE — Telephone Encounter (Signed)
LMTCB

## 2013-08-25 NOTE — Telephone Encounter (Signed)
LMTCB and will close per triage protocol 

## 2013-09-15 ENCOUNTER — Ambulatory Visit (INDEPENDENT_AMBULATORY_CARE_PROVIDER_SITE_OTHER): Payer: Medicare Other | Admitting: Internal Medicine

## 2013-09-15 ENCOUNTER — Encounter: Payer: Self-pay | Admitting: Internal Medicine

## 2013-09-15 VITALS — BP 124/62 | HR 67 | Temp 97.6°F | Ht 66.5 in | Wt 206.6 lb

## 2013-09-15 DIAGNOSIS — Z23 Encounter for immunization: Secondary | ICD-10-CM

## 2013-09-15 DIAGNOSIS — J449 Chronic obstructive pulmonary disease, unspecified: Secondary | ICD-10-CM

## 2013-09-15 DIAGNOSIS — J9610 Chronic respiratory failure, unspecified whether with hypoxia or hypercapnia: Secondary | ICD-10-CM

## 2013-09-15 DIAGNOSIS — I10 Essential (primary) hypertension: Secondary | ICD-10-CM

## 2013-09-15 MED ORDER — BISOPROLOL FUMARATE 5 MG PO TABS: 5.0000 mg | ORAL_TABLET | Freq: Every day | ORAL | Status: DC

## 2013-09-15 NOTE — Progress Notes (Signed)
Subjective:    Patient ID: John [name], male    DOB: 13-Apr-1946   MRN: [id number]   Brief patient profile:   46 yobm quit smoking 09/2012 with multiple admits for "aecopd" last one:  Admit date: 12/06/2012  Discharge date: 12/08/2012   Discharge Diagnoses:  COPD exacerbation  HTN (hypertension)  Allergic drug rash  Drug-induced hyperglycemia  CHF exacerbation  Hyperglycemia induced by steroids  CAD   History of Present Illness  12/18/2012 1st pulmonary ov/ Christyl Osentoski cc doe s 02 up to half a mile s stopping and overall using 02 mostly evening before bed on advair 500/50 q am only and does not know how to use it correctly. Not needing any daytime saba but generally confused on how to use meds.  >change to Advair 250/50    01/11/2013 NP Follow up and medication review   We reviewed all his medications and organized them into a medication calendar with patient education. It appears the patient is taking his medications Better on  Advair with decreased cough and shortness of breath. No wheezing.  rec No change rx  Use med calendar   03/01/2013 f/u ov/Charmin Aguiniga cc confused with meds/ med calendar using advair 500 and confused with how to use hfa albuterol but overall doing better rec Advair 250/50 one twice daily = Plan A Only use your albuterol (ventolin = Plan B)   Please see patient coordinator before you leave today  to schedule portable 02   03/19/2013 f/u ov/Audri Kozub cc sob about the same, getting confused with inhalers, mdi still poor at baseline.  Portable 02 helping a little vs before. rec Symbicort 160 Take 2 puffs first thing in am and then another 2 puffs about 12 hours later.  Work on inhaler technique:   .   Only use your albuterol (ventolin) as a rescue medication    06/17/2013 f/u ov/Catalia Massett re copd/ 02 dep but takes it off during the day if sitting at home Chief Complaint  Patient presents with  . Follow-up    Breathing unchanged since the last visit. No new co's today.    not wearing 02 consistently s 02 (mowed grass x 20 m s 02 s stopping self propelled in 2nd gear)  rec Continue symbicort 160 Take 2 puffs first thing in am and then another 2 puffs about 12 hours later Remember don't leave home without your rescue inhaler Rec:  02 2lpm sitting and walking outside the house Please schedule a follow up visit in 3 months but call sooner if needed  Add consider adding tudorza if not satisfied with ex tol  09/15/2013 f/u ov/Bernal Luhman re: copd/02 dep Chief Complaint  Patient presents with  . Follow-up    Pt states his breathing is unchanged since the last visit. No new co's today. Usinf albuterol HFA approx 3 times per wk.  not really limited from desired activities  No obvious daytime variabilty or assoc chronic cough or cp or chest tightness, subjective wheeze overt sinus or hb symptoms. No unusual exp hx    Sleeping ok without nocturnal  or early am exacerbation  of respiratory  c/o's or need for noct saba. Also denies any obvious fluctuation of symptoms with weather or environmental changes or other aggravating or alleviating factors except as outlined above   Current Medications, Allergies, Past Medical History, Past Surgical History, Family History, and Social History were reviewed in [employer] record.  ROS  The following are not active complaints unless bolded sore  throat, dysphagia, dental problems, itching, sneezing,  nasal congestion or excess/ purulent secretions, ear ache,   fever, chills, sweats, unintended wt loss, pleuritic or exertional cp, hemoptysis,  orthopnea pnd or leg swelling, presyncope, palpitations, heartburn, abdominal pain, anorexia, nausea, vomiting, diarrhea  or change in bowel or urinary habits, change in stools or urine, dysuria,hematuria,  rash, arthralgias, visual complaints, headache, numbness weakness or ataxia or problems with walking or coordination,  change in mood/affect or memory.                   Objective:   Physical Exam  03/19/2013  Wt 208 vs 06/17/2013 209  09/15/2013  206  Wt Readings from Last 3 Encounters:  03/01/13 207 lb 3.2 oz (93.985 kg)  01/11/13 197 lb 9.6 oz (89.631 kg)  12/18/12 183 lb (83.008 kg)     HEENT mild turbinate edema.  Oropharynx no thrush or excess pnd or cobblestoning. Edentulous with dentures in place No JVD or cervical adenopathy. Mild accessory muscle hypertrophy. Trachea midline, nl thryroid. Chest was hyperinflated by percussion with diminished breath sounds and moderate increased exp time without wheeze.  Regular rate and rhythm without murmur gallop or rub or increase P2 or edema.  Abd: no hsm, nl excursion. Ext warm without cyanosis or clubbing.       CXR  09/15/2013 :  .did not go as requested        Assessment & Plan:

## 2013-09-15 NOTE — Patient Instructions (Addendum)
Stop carvedilol (coreg) and substitute with bisoprolol 5 mg  One daily   Continue symbicort 160 Take 2 puffs first thing in am and then another 2 puffs about 12 hours later  Remember don't leave home without your rescue inhaler (ventolin)  Rec:  02 2lpm sleeping and walking outside the house  Please schedule a follow up visit in 3 months but call sooner if needed

## 2013-09-16 NOTE — Assessment & Plan Note (Signed)
Strongly prefer in this setting: Bystolic, the most beta -1  selective Beta blocker available in sample form, with bisoprolol the most selective generic choice  on the market.   Try bisoprolol 5 mg daily  

## 2013-09-16 NOTE — Assessment & Plan Note (Signed)
-   12/18/2012  Walked RA  2 laps @ 185 ft each stopped due to  desat 87% - 03/19/2013  Walked 2lpm x 3 laps @ 185 ft each stopped due to  End of study, sat 91% - 06/17/2013   Walked RA x one lap @ 185 stopped due to desat to 87 when started second lap -09/15/2013   Walked RA x one lap @ 185 stopped due to  End of study, no desat   Rec:  02 2lpm sleeping  and with exertion outside the house as unlikely to desat at rest or indoor activity but has cor pulmonale so def wear at hs

## 2013-09-16 NOTE — Assessment & Plan Note (Signed)
-   PFT's 02/05/13 FEV1  1.15 (41%) ratio 48 and 22% better p B2,  DLCO  61 corrects to 81% - hfa  90%  09/15/2013   He has so much reversibility and such good use of symbicort we should try change coreg to bisoprolol x 3 months to see if improves.  If not, consider adding lama next ov.     Each maintenance medication was reviewed in detail including most importantly the difference between maintenance and as needed and under what circumstances the prns are to be used.  Please see instructions for details which were reviewed in writing and the patient given a copy.

## 2013-10-13 ENCOUNTER — Telehealth: Payer: Self-pay | Admitting: Internal Medicine

## 2013-10-13 NOTE — Telephone Encounter (Signed)
I spoke with John [name] from [facility]. Calling to confirm pt is suppose to be on bisoprolol. I advised this is correct. Nothing further needed

## 2013-11-18 ENCOUNTER — Telehealth: Payer: Self-pay | Admitting: Internal Medicine

## 2013-11-18 NOTE — Telephone Encounter (Signed)
Called and spoke with John [name] and she is aware that MW is pulmonary and they will need to send this form to the pts primary care doctor.  Nothing further is needed.

## 2013-12-08 ENCOUNTER — Other Ambulatory Visit: Payer: Self-pay | Admitting: Internal Medicine

## 2014-01-03 ENCOUNTER — Other Ambulatory Visit: Payer: Self-pay | Admitting: Internal Medicine

## 2014-01-05 ENCOUNTER — Other Ambulatory Visit: Payer: Self-pay | Admitting: Internal Medicine

## 2014-01-10 ENCOUNTER — Ambulatory Visit (INDEPENDENT_AMBULATORY_CARE_PROVIDER_SITE_OTHER)
Admission: RE | Admit: 2014-01-10 | Discharge: 2014-01-10 | Disposition: A | Discharge status: Home / Self Care | Payer: Medicare Other | Source: Ambulatory Visit | Attending: Internal Medicine | Admitting: Internal Medicine

## 2014-01-10 ENCOUNTER — Encounter: Payer: Self-pay | Admitting: Internal Medicine

## 2014-01-10 ENCOUNTER — Other Ambulatory Visit: Payer: Medicare Other

## 2014-01-10 ENCOUNTER — Ambulatory Visit (INDEPENDENT_AMBULATORY_CARE_PROVIDER_SITE_OTHER): Payer: Medicare Other | Admitting: Internal Medicine

## 2014-01-10 VITALS — BP 90/50 | HR 62 | Temp 98.0°F | Ht 67.0 in | Wt 212.0 lb

## 2014-01-10 DIAGNOSIS — J9610 Chronic respiratory failure, unspecified whether with hypoxia or hypercapnia: Secondary | ICD-10-CM

## 2014-01-10 DIAGNOSIS — J449 Chronic obstructive pulmonary disease, unspecified: Secondary | ICD-10-CM

## 2014-01-10 NOTE — Progress Notes (Signed)
Quick Note:  Spoke with pt and notified of results per Dr. Wert. Pt verbalized understanding and denied any questions.  ______ 

## 2014-01-10 NOTE — Assessment & Plan Note (Signed)
-   12/18/2012  Walked RA  2 laps @ 185 ft each stopped due to  desat 87% - 03/19/2013  Walked 2lpm x 3 laps @ 185 ft each stopped due to  End of study, sat 91% - 06/17/2013   Walked RA x one lap @ 185 stopped due to desat to 87 when started second lap -09/15/2013   Walked RA x one lap @ 185 stopped due to  End of study, no desat   sats ok at rest ra today @ 97%   Rec:  02 2lpm sleeping  and with exertion outside the house as unlikely to desat at rest or indoor activity but has cor pulmonale so def should cont at  [facility] and with outside activities

## 2014-01-10 NOTE — Assessment & Plan Note (Addendum)
-   PFT's 02/05/13 FEV1  1.15 (41%) ratio 48 and 22% better p B2,  DLCO  61 corrects to 81%  The proper method of use, as well as anticipated side effects, of a metered-dose inhaler are discussed and demonstrated to the patient. Improved effectiveness after extensive coaching during this visit to a level of approximately  90%  He's doing well enough on what I think is symbiocrt 160 2bid and prn albuterol to keep things the same for now as he has extremely limited insight into meds.    Each maintenance medication was reviewed in detail including most importantly the difference between maintenance and as needed and under what circumstances the prns are to be used.  Please see instructions for details which were reviewed in writing and the patient given a copy.

## 2014-01-10 NOTE — Patient Instructions (Addendum)
Use 02 2lpm at bedtime and with exertion but ok to leave if off at rest and walking inside your house  Only use your albuterol(proair)  as a rescue medication to be used if you can't catch your breath by resting or doing a relaxed purse lip breathing pattern.  - The less you use it, the better it will work when you need it. - Ok to use up to 2 puffs  every 4 hours if you must but call for immediate appointment if use goes up over your usual need - Don't leave home without it !!  (think of it like the spare tire for your car)   Please remember to go to the x-ray department downstairs for your tests - we will call you with the results when they are available.    Please schedule a follow up visit in 6 months but call sooner if needed

## 2014-01-10 NOTE — Progress Notes (Signed)
Subjective:    Patient ID: John [name], male    DOB: 1946-11-10   MRN: [id number]   Brief patient profile:   1 yobm quit smoking 09/2012 with multiple admits for "aecopd" last one:  Admit date: 12/06/2012  Discharge date: 12/08/2012   Discharge Diagnoses:  COPD exacerbation  HTN (hypertension)  Allergic drug rash  Drug-induced hyperglycemia  CHF exacerbation  Hyperglycemia induced by steroids  CAD   History of Present Illness  12/18/2012 1st pulmonary ov/ John [name] cc doe s 02 up to half a mile s stopping and overall using 02 mostly evening before bed on advair 500/50 q am only and does not know how to use it correctly. Not needing any daytime saba but generally confused on how to use meds.  >change to Advair 250/50    01/11/2013 NP Follow up and medication review   We reviewed all his medications and organized them into a medication calendar with patient education. It appears the patient is taking his medications Better on  Advair with decreased cough and shortness of breath. No wheezing.  rec No change rx  Use med calendar   03/01/2013 f/u ov/John [name] cc confused with meds/ med calendar using advair 500 and confused with how to use hfa albuterol but overall doing better rec Advair 250/50 one twice daily = Plan A Only use your albuterol (ventolin = Plan B)   Please see patient coordinator before you leave today  to schedule portable 02      09/15/2013 f/u ov/John [name] re: copd/02 dep Chief Complaint  Patient presents with  . Follow-up    Pt states his breathing is unchanged since the last visit. No new co's today. Usinf albuterol HFA approx 3 times per wk.  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 Follow-up    Pt states breathing is doing well. Using proair at least once per day, sometimes twice.   uses the saba once during the day p does his walk x 30 min Confused with names of inhalers and using saba before activity automatically rather than prn  Has symbicort in his pocket as his rescue   Not using 02 at rest, just at night and with ex  No obvious daytime variabilty or assoc chronic cough or cp or chest tightness, subjective wheeze overt sinus or hb symptoms. No unusual exp hx    Sleeping ok without nocturnal  or early am exacerbation  of respiratory  c/o's or need for noct saba. Also denies any obvious fluctuation of symptoms with weather or environmental changes or other aggravating or alleviating factors except as outlined above   Current Medications, Allergies, Past Medical History, Past Surgical History, Family History, and Social History were reviewed in [employer] record.  ROS  The following are not active complaints unless bolded sore throat, dysphagia, dental problems, itching, sneezing,  nasal congestion or excess/ purulent secretions, ear ache,   fever, chills, sweats, unintended wt loss, pleuritic or exertional cp, hemoptysis,  orthopnea pnd or leg swelling, presyncope, palpitations, heartburn, abdominal pain, anorexia, nausea, vomiting, diarrhea  or change in bowel or urinary habits, change in stools or urine, dysuria,hematuria,  rash, arthralgias, visual complaints, headache, numbness weakness or ataxia or problems with walking or coordination,  change in mood/affect or memory.                  Objective:   Physical Exam  03/19/2013  Wt 208 vs 06/17/2013 209  09/15/2013  206 > 212 01/10/2014   Wt Readings from Last 3 Encounters:  03/01/13 207 lb 3.2 oz (93.985 kg)  01/11/13 197 lb 9.6 oz (89.631 kg)   12/18/12 183 lb (83.008 kg)    amb bm nad  - note bp low but asymptomatic  HEENT mild turbinate edema.  Edentulous,  Oropharynx no thrush or excess pnd or cobblestoning. Edentulous with dentures in place No JVD or cervical adenopathy. Mild accessory muscle hypertrophy. Trachea midline, nl thryroid. Chest was hyperinflated by percussion with diminished breath sounds and moderate increased exp time without wheeze.  Regular rate and rhythm without murmur gallop or rub or increase P2 or edema.  Abd: no hsm, nl excursion. Ext warm without cyanosis or clubbing.       CXR  01/10/2014 :  No active cardiopulmonary disease.       Assessment & Plan:

## 2014-01-27 ENCOUNTER — Other Ambulatory Visit: Payer: Self-pay | Admitting: Internal Medicine

## 2014-02-02 ENCOUNTER — Other Ambulatory Visit: Payer: Self-pay | Admitting: Internal Medicine

## 2014-03-02 ENCOUNTER — Other Ambulatory Visit: Payer: Self-pay | Admitting: Internal Medicine

## 2014-04-28 ENCOUNTER — Other Ambulatory Visit: Payer: Self-pay | Admitting: Internal Medicine

## 2014-05-26 ENCOUNTER — Other Ambulatory Visit: Payer: Self-pay | Admitting: Internal Medicine

## 2014-06-23 ENCOUNTER — Other Ambulatory Visit: Payer: Self-pay | Admitting: Internal Medicine

## 2014-06-23 NOTE — Telephone Encounter (Signed)
[id number] to schedule the pt an appt before refills are sent. [place] Bing, CMA

## 2014-06-23 NOTE — Telephone Encounter (Signed)
Last ov w/ MW Jan 2015, follow up in 6 months

## 2014-07-11 ENCOUNTER — Encounter: Payer: Self-pay | Admitting: Internal Medicine

## 2014-07-11 ENCOUNTER — Ambulatory Visit (INDEPENDENT_AMBULATORY_CARE_PROVIDER_SITE_OTHER): Payer: Commercial Managed Care - HMO | Admitting: Internal Medicine

## 2014-07-11 VITALS — BP 150/68 | HR 60 | Temp 97.4°F | Ht 66.0 in | Wt 210.0 lb

## 2014-07-11 DIAGNOSIS — J449 Chronic obstructive pulmonary disease, unspecified: Secondary | ICD-10-CM

## 2014-07-11 DIAGNOSIS — Z23 Encounter for immunization: Secondary | ICD-10-CM

## 2014-07-11 NOTE — Progress Notes (Signed)
Subjective:    Patient ID: John [name], male    DOB: 03-01-1946   MRN: [id number]  Brief patient profile:  62 yobm quit smoking 09/2012 with multiple admits for "aecopd" last one:   Admit date: 12/06/2012  Discharge date: 12/08/2012   Discharge Diagnoses:  COPD exacerbation  HTN (hypertension)  Allergic drug rash  Drug-induced hyperglycemia  CHF exacerbation  Hyperglycemia induced by steroids  CAD   History of Present Illness  12/18/2012 1st pulmonary ov/ John [name] cc doe s 02 up to half a mile s stopping and overall using 02 mostly evening before bed on advair 500/50 q am only and does not know how to use it correctly. Not needing any daytime saba but generally confused on how to use meds.  >change to Advair 250/50    01/11/2013 NP Follow up and medication review   We reviewed all his medications and organized them into a medication calendar with patient education. It appears the patient is taking his medications Better on  Advair with decreased cough and shortness of breath. No wheezing.  rec No change rx  Use med calendar   03/01/2013 f/u ov/John [name] cc confused with meds/ med calendar using advair 500 and confused with how to use hfa albuterol but overall doing better rec Advair 250/50 one twice daily = Plan A Only use your albuterol (ventolin = Plan B)   Please see patient coordinator before you leave today  to schedule portable 02      09/15/2013 f/u ov/John [name] re: copd/02 dep Chief Complaint  Patient presents with  . Follow-up    Pt states his breathing is unchanged since the last visit. No new co's today. Usinf albuterol HFA approx 3 times per wk.  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 Follow-up    Pt states breathing is doing well. Using proair at least once per day, sometimes twice.   uses the saba once during the day p does his walk x 30 min Confused with names of inhalers and using saba before activity automatically rather than prn  Has symbicort in his pocket as his rescue   Not using 02 at rest, just at night and with ex rec Use 02 2lpm at bedtime and with exertion but ok to leave if off at rest and walking inside your house Only use your albuterol(proair)  as a rescue medication   07/11/2014 f/u ov/John [name] re: copd/ ? Medical illiteracy Chief Complaint  Patient presents with  . Follow-up    Pt states he left his O2 in the [name]. Pt states his breathing is doing better, pt is able to do more activties. Denies cough, CP/tightness.   severely confused again with instructions, maint vs prns  However, denies limited by breathing from desired activities  As long as he walks slow, avoids heat   No obvious daytime variabilty or assoc chronic cough or cp or chest tightness, subjective wheeze overt sinus or hb symptoms. No unusual exp hx    Sleeping ok without nocturnal  or early am exacerbation  of respiratory  c/o's  or need for noct saba. Also denies any obvious fluctuation of symptoms with weather or environmental changes or other aggravating or alleviating factors except as outlined above   Current Medications, Allergies, Past Medical History, Past Surgical History, Family History, and Social History were reviewed in [employer] record.  ROS  The following are not active complaints unless bolded sore throat, dysphagia, dental problems, itching, sneezing,  nasal congestion or excess/ purulent secretions, ear ache,   fever, chills, sweats, unintended wt loss, pleuritic or exertional cp, hemoptysis,  orthopnea pnd or leg  swelling, presyncope, palpitations, heartburn, abdominal [name], anorexia, nausea, vomiting, diarrhea  or change in bowel or urinary habits, change in stools or urine, dysuria,hematuria,  rash, arthralgias, visual complaints, headache, numbness weakness or ataxia or problems with walking or coordination,  change in mood/affect or memory.                  Objective:   Physical Exam  03/19/2013  Wt 208 vs 06/17/2013 209  09/15/2013  206 > 212 01/10/2014 > 07/11/2014  210   Wt Readings from Last 3 Encounters:  03/01/13 207 lb 3.2 oz (93.985 kg)  01/11/13 197 lb 9.6 oz (89.631 kg)  12/18/12 183 lb (83.008 kg)      amb bm nad  HEENT mild turbinate edema.  Edentulous,  Oropharynx no thrush or excess pnd or cobblestoning. Edentulous with dentures in place No JVD or cervical adenopathy. Mild accessory muscle hypertrophy. Trachea midline, nl thryroid. Chest was hyperinflated by percussion with diminished breath sounds and moderate increased exp time without wheeze.  Regular rate and rhythm without murmur gallop or rub or increase P2 or edema.  Abd: no hsm, nl excursion. Ext warm without cyanosis or clubbing.       CXR  01/10/2014 :  No active cardiopulmonary disease.       Assessment & Plan:

## 2014-07-11 NOTE — Patient Instructions (Addendum)
Symbicort 160 Take 2 puffs first thing in am and then another 2 puffs about 12 hours later.     Only use your albuterol (PROAIR)  as a rescue medication to be used if you can't catch your breath by resting or doing a relaxed purse lip breathing pattern.  - The less you use it, the better it will work when you need it. - Ok to use up to 2 puffs  every 4 hours if you must but call for immediate appointment if use goes up over your usual need - Don't leave home without it !!  (think of it like the spare tire for your car)   Work on inhaler technique:  relax and gently blow all the way out then take a nice smooth deep breath back in, triggering the inhaler at same time you start breathing in.  Hold for up to 5 seconds if you can.  Rinse and gargle with water when done    Please schedule a follow up visit in 3 months but call sooner if needed

## 2014-07-12 NOTE — Assessment & Plan Note (Addendum)
-   PFT's 02/05/13 FEV1  1.15 (41%) ratio 48 and 22% better p B2,  DLCO  61 corrects to 81%  DDX of  difficult airways management all start with A and  include Adherence, Ace Inhibitors, Acid Reflux, Active Sinus Disease, Alpha 1 Antitripsin deficiency, Anxiety masquerading as Airways dz,  ABPA,  allergy(esp in young), Aspiration (esp in elderly), Adverse effects of DPI,  Active smokers, plus two Bs  = Bronchiectasis and Beta blocker use..and one C= CHF  Adherence is always the initial "prime suspect" and is a multilayered concern that requires a "trust but verify" approach in every patient - starting with knowing how to use medications, especially inhalers, correctly, keeping up with refills and understanding the fundamental difference between maintenance and prns vs those medications only taken for a very short course and then stopped and not refilled.  ? Medically illiterate, showed each inhaler symbicort and proair as both are red but clearly different and point out where they were .- The proper method of use, as well as anticipated side effects, of a metered-dose inhaler are discussed and demonstrated to the patient. Improved effectiveness after extensive coaching during this visit to a level of approximately  90%

## 2014-07-23 ENCOUNTER — Other Ambulatory Visit: Payer: Self-pay | Admitting: Internal Medicine

## 2014-08-19 ENCOUNTER — Other Ambulatory Visit: Payer: Self-pay | Admitting: Internal Medicine

## 2014-10-10 ENCOUNTER — Encounter: Payer: Self-pay | Admitting: Internal Medicine

## 2014-10-10 ENCOUNTER — Ambulatory Visit (INDEPENDENT_AMBULATORY_CARE_PROVIDER_SITE_OTHER): Payer: Commercial Managed Care - HMO | Admitting: Internal Medicine

## 2014-10-10 VITALS — BP 150/68 | HR 68 | Temp 97.6°F | Ht 67.0 in | Wt 209.8 lb

## 2014-10-10 DIAGNOSIS — J449 Chronic obstructive pulmonary disease, unspecified: Secondary | ICD-10-CM

## 2014-10-10 DIAGNOSIS — Z23 Encounter for immunization: Secondary | ICD-10-CM

## 2014-10-10 DIAGNOSIS — J9611 Chronic respiratory failure with hypoxia: Secondary | ICD-10-CM

## 2014-10-10 NOTE — Progress Notes (Signed)
Subjective:    Patient ID: John [name], male    DOB: 07/26/1946   MRN: [id number]  Brief patient profile:  3 yobm quit smoking 09/2012 with multiple admits for "aecopd" last one:   Admit date: 12/06/2012  Discharge date: 12/08/2012   Discharge Diagnoses:  COPD exacerbation  HTN (hypertension)  Allergic drug rash  Drug-induced hyperglycemia  CHF exacerbation  Hyperglycemia induced by steroids  CAD   History of Present Illness  12/18/2012 1st pulmonary ov/ Wert cc doe s 02 up to half a mile s stopping and overall using 02 mostly evening before bed on advair 500/50 q am only and does not know how to use it correctly. Not needing any daytime saba but generally confused on how to use meds.  >change to Advair 250/50    01/11/2013 NP Follow up and medication review   We reviewed all his medications and organized them into a medication calendar with patient education. It appears the patient is taking his medications Better on  Advair with decreased cough and shortness of breath. No wheezing.  rec No change rx  Use med calendar   03/01/2013 f/u ov/Wert cc confused with meds/ med calendar using advair 500 and confused with how to use hfa albuterol but overall doing better rec Advair 250/50 one twice daily = Plan A Only use your albuterol (ventolin = Plan B)   Please see patient coordinator before you leave today  to schedule portable 02      09/15/2013 f/u ov/Wert re: copd/02 dep Chief Complaint  Patient presents with  . Follow-up    Pt states his breathing is unchanged since the last visit. No new co's today. Usinf albuterol HFA approx 3 times per wk.  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 Follow-up    Pt states breathing is doing well. Using proair at least once per day, sometimes twice.   uses the saba once during the day p does his walk x 30 min Confused with names of inhalers and using saba before activity automatically rather than prn  Has symbicort in his pocket as his rescue   Not using 02 at rest, just at night and with ex rec Use 02 2lpm at bedtime and with exertion but ok to leave if off at rest and walking inside your house Only use your albuterol(proair)  as a rescue medication   07/11/2014 f/u ov/Wert re: copd/ ? Medical illiteracy Chief Complaint  Patient presents with  . Follow-up    Pt states he left his O2 in the car. Pt states his breathing is doing better, pt is able to do more activties. Denies cough, CP/tightness.   severely confused again with instructions, maint vs prns  However, denies limited by breathing from desired activities  As long as he walks slow, avoids heat rec Symbicort 160 Take 2 puffs first thing in am and then another 2 puffs about 12 hours later.  Only use your albuterol prn    10/10/2014 1st [facility] office visit/ Wert   Chief Complaint  Patient presents with  .  Follow-up    Pt reports his breathing is unchanged since his last visit. He c/o bidil making him feel lightheaded.  still a bit confused with symbicort vs proair  Using proair around twice in 24 but not really clear on why/ when he needs it Not limited by breathing from desired activities  But very sedentary    No obvious daytime variabilty or assoc chronic cough or cp or chest tightness, subjective wheeze overt sinus or hb symptoms. No unusual exp hx    Sleeping ok without nocturnal  or early am exacerbation  of respiratory  c/o's or need for noct saba. Also denies any obvious fluctuation of symptoms with weather or  environmental changes or other aggravating or alleviating factors except as outlined above   Current Medications, Allergies, Past Medical History, Past Surgical History, Family History, and Social History were reviewed in [employer] record.  ROS  The following are not active complaints unless bolded sore throat, dysphagia, dental problems, itching, sneezing,  nasal congestion or excess/ purulent secretions, ear ache,   fever, chills, sweats, unintended wt loss, pleuritic or exertional cp, hemoptysis,  orthopnea pnd or leg swelling, presyncope, palpitations, heartburn, abdominal pain, anorexia, nausea, vomiting, diarrhea  or change in bowel or urinary habits, change in stools or urine, dysuria,hematuria,  rash, arthralgias, visual complaints, headache, numbness weakness or ataxia or problems with walking or coordination,  change in mood/affect or memory.                  Objective:   Physical Exam  03/19/2013  Wt 208 vs 06/17/2013 209  09/15/2013  206 > 212 01/10/2014 > 07/11/2014  210  > 10/10/2014 209   Wt Readings from Last 3 Encounters:  03/01/13 207 lb 3.2 oz (93.985 kg)  01/11/13 197 lb 9.6 oz (89.631 kg)  12/18/12 183 lb (83.008 kg)      amb bm nad  HEENT mild turbinate edema.  Edentulous,  Oropharynx no thrush or excess pnd or cobblestoning. Edentulous with dentures in place No JVD or cervical adenopathy. Mild accessory muscle hypertrophy. Trachea midline, nl thryroid. Chest was hyperinflated by percussion with diminished breath sounds and moderate increased exp time without wheeze.  Regular rate and rhythm without murmur gallop or rub or increase P2 or edema.  Abd: no hsm, nl excursion. Ext warm without cyanosis or clubbing.       CXR  01/10/2014 :  No active cardiopulmonary disease.       Assessment & Plan:

## 2014-10-10 NOTE — Patient Instructions (Addendum)
No change in medications for now  Remember the proair is your rescue medication, don't leave home without   See Tammy NP  In 3 months with all your medications, even over the counter meds, separated in two separate bags, the ones you take no matter what vs the ones you stop once you feel better and take only as needed when you feel you need them.   Tammy  will generate for you a new user friendly medication calendar that will put [place] all on the same page re: your medication use.     Without this process, it simply isn't possible to assure that we are providing  your outpatient care  with  the attention to detail we feel you deserve.   If we cannot assure that you're getting that kind of care,  then we cannot manage your problem effectively from this clinic.  Once you have seen Tammy and we are sure that we're all on the same page with your medication use she will arrange follow up with me.

## 2014-10-11 NOTE — Assessment & Plan Note (Signed)
-   12/18/2012  Walked RA  2 laps @ 185 ft each stopped due to  desat 87% - 03/19/2013  Walked 2lpm x 3 laps @ 185 ft each stopped due to  End of study, sat 91% - 06/17/2013   Walked RA x one lap @ 185 stopped due to desat to 87 when started second lap -09/15/2013   Walked RA x one lap @ 185 stopped due to  End of study, no desat   Rec:  02 2lpm sleeping  and with exertion outside the house as unlikely to desat at rest or indoor activity but has cor pulmonale so def should cont at  [facility]

## 2014-10-11 NOTE — Assessment & Plan Note (Signed)
-   PFT's 02/05/13 FEV1  1.15 (41%) ratio 48 and 22% better p B2,  DLCO  61 corrects to 81%  The proper method of use, as well as anticipated side effects, of a metered-dose inhaler are discussed and demonstrated to the patient. Improved effectiveness after extensive coaching during this visit to a level of approximately  90%  I had an extended discussion with the patient today lasting 15 to 20 minutes of a 25 minute visit on the following issues:   Each maintenance medication was reviewed in detail including most importantly the difference between maintenance and as needed and under what circumstances the prns are to be used.  Please see instructions for details which were reviewed in writing and the patient given a copy.    Needs med rec next ov.  To keep things simple, I have asked the patient to first separate medicines that are perceived as maintenance, that is to be taken daily "no matter what", from those medicines that are taken on only on an as-needed basis and I have given the patient examples of both, and then return to see our NP to generate a  detailed  medication calendar which should be followed until the next physician sees the patient and updates it.

## 2014-10-17 ENCOUNTER — Telehealth: Payer: Self-pay | Admitting: Internal Medicine

## 2014-10-17 NOTE — Telephone Encounter (Signed)
Error.Stanley A Dalton ° °

## 2014-10-19 ENCOUNTER — Other Ambulatory Visit: Payer: Self-pay | Admitting: Internal Medicine

## 2014-11-03 ENCOUNTER — Ambulatory Visit (HOSPITAL_COMMUNITY)
Admission: RE | Admit: 2014-11-03 | Discharge: 2014-11-03 | Disposition: A | Discharge status: Home / Self Care | Payer: Medicaid Other | Source: Ambulatory Visit | Attending: Cardiology | Admitting: Cardiology

## 2014-11-03 ENCOUNTER — Encounter (HOSPITAL_COMMUNITY): Payer: Self-pay

## 2014-11-03 VITALS — BP 140/70 | HR 79 | Ht 64.0 in | Wt 208.8 lb

## 2014-11-03 DIAGNOSIS — R0902 Hypoxemia: Secondary | ICD-10-CM | POA: Diagnosis not present

## 2014-11-03 DIAGNOSIS — I509 Heart failure, unspecified: Secondary | ICD-10-CM | POA: Diagnosis present

## 2014-11-03 DIAGNOSIS — I2510 Atherosclerotic heart disease of native coronary artery without angina pectoris: Secondary | ICD-10-CM | POA: Diagnosis not present

## 2014-11-03 DIAGNOSIS — I5023 Acute on chronic systolic (congestive) heart failure: Secondary | ICD-10-CM

## 2014-11-03 DIAGNOSIS — I10 Essential (primary) hypertension: Secondary | ICD-10-CM | POA: Diagnosis not present

## 2014-11-03 DIAGNOSIS — J449 Chronic obstructive pulmonary disease, unspecified: Secondary | ICD-10-CM | POA: Diagnosis not present

## 2014-11-03 DIAGNOSIS — I50810 Right heart failure, unspecified: Secondary | ICD-10-CM

## 2014-11-03 LAB — BASIC METABOLIC PANEL
ANION GAP: 15 (ref 5–15)
BUN: 22 mg/dL (ref 6–23)
CALCIUM: 9.8 mg/dL (ref 8.4–10.5)
CO2: 25 mEq/L (ref 19–32)
CREATININE: 1.36 mg/dL — AB (ref 0.50–1.35)
Chloride: 98 mEq/L (ref 96–112)
GFR calc Af Amer: 60 mL/min — ABNORMAL LOW (ref >90)
GFR, EST NON AFRICAN AMERICAN: 52 mL/min — AB (ref >90)
GLUCOSE: 116 mg/dL — AB (ref 70–99)
Potassium: 4.5 mEq/L (ref 3.7–5.3)
Sodium: 138 mEq/L (ref 137–147)

## 2014-11-03 LAB — PRO B NATRIURETIC PEPTIDE: PRO B NATRI PEPTIDE: 7.1 pg/mL (ref 0–125)

## 2014-11-03 MED ORDER — ATORVASTATIN CALCIUM 20 MG PO TABS: 20.0000 mg | ORAL_TABLET | Freq: Every day | ORAL | Status: DC

## 2014-11-03 NOTE — Patient Instructions (Signed)
Labs today and again in 2 months.  START Atorvastatin 20 mg daily  Your physician has requested that you have an echocardiogram. Echocardiography is a painless test that uses sound waves to create images of your heart. It provides your doctor with information about the size and shape of your heart and how well your heart's chambers and valves are working. This procedure takes approximately one hour. There are no restrictions for this procedure.  Your physician recommends that you schedule a follow-up appointment in: 6 months  Your physician has recommended that you wear your oxygen during exertion(raking leaves,walking long distances, strenuous house work)  and at night.  Do the following things EVERYDAY: 1) Weigh yourself in the morning before breakfast. Write it down and keep it in a log. 2) Take your medicines as prescribed 3) Eat low salt foods-Limit salt (sodium) to 2000 mg per day.  4) Stay as active as you can everyday 5) Limit all fluids for the day to less than 2 liters 6)

## 2014-11-04 DIAGNOSIS — I50810 Right heart failure, unspecified: Secondary | ICD-10-CM | POA: Insufficient documentation

## 2014-11-04 NOTE — Progress Notes (Signed)
Patient ID: John [name], male   DOB: Aug 28, 1946, 68 y.o.   MRN: [id number] PCP: Juluis Mire  68 yo with history of COPD on home oxygen at night and with exertion, CAD (moderate, no intervention), and cardiomyopathy.  Patient no longer smokes.  He had LHC/RHC in 2006 with moderate CAD but no intervention and EF about 35%.  Cardiomyopathy was thought to be out of proportion to severity of CAD.  Most recently, echo in 2/13 showed EF improved to 50-55% but the RV was moderately dilated with moderate to severe systolic dysfunction.  He desats with ambulation and is supposed to be on oxygen with exertion and at night.  He is only using it at night.  He was seen by Dr Melvyn Novas recently, and desaturation with ambulation was confirmed .   Currently, patient is symptomatically stable.  He is short of breath with mowing the grass or doing other moderate work.  He is not short of breath walking on flat ground or climbing a flight of steps.  No chest pain.  No palpitations.  No orthopnea or PND.  No lightheadedness or syncope.   ECG: NSR, normal  PMH: 1. COPD: Patient is on home oxygen with exertion and at night.  2. Prior ETOH abuse.  3. HTN: Angioedema with ACEI.  4. Gout 5. CAD: Nonobstructive.  LHC (2006) with 60% pLAD, 40-50% mLAD, 60-70% mLCx, 50% OM1, 70% proximal-mid RCA.   6. Cardiomyopathy: Primarily nonischemic. Out of proportion to CAD. EF 35% in 2006.  RHC (2006) with mean RA 8, PA 42/19 mean 29, mean PCWP 13, CI 3.0.  Echo (2/13) with EF 50-55%, moderately dilated RV with moderate to severely decreased systolic function, no TR.   SH: Lives with daughter.  Prior smoker.  Prior heavy ETOH (none now).    FH: No premature CAD  ROS: All systems reviewed and negative except as per HPI.   Current Outpatient Prescriptions  Medication Sig Dispense Refill  . allopurinol (ZYLOPRIM) 100 MG tablet TAKE 1 TABLET BY MOUTH EVERY DAY 30 tablet 5  . amLODipine (NORVASC) 10 MG tablet TAKE 1 TABLET BY  MOUTH EVERY DAY 30 tablet 5  . aspirin 81 MG chewable tablet Chew 81 mg by mouth daily.    . cloNIDine (CATAPRES) 0.1 MG tablet Take 0.1 mg by mouth 2 (two) times daily.    . colchicine 0.6 MG tablet TAKE 1 TABLET BY MOUTH EVERY DAY 30 tablet 5  . fenofibrate (TRICOR) 48 MG tablet TAKE 1 TABLET BY MOUTH EVERY DAY  NEEDS OFFICE VISIT 30 tablet 6  . furosemide (LASIX) 20 MG tablet TAKE 1 TABLET BY MOUTH DAILY 30 tablet 5  . polyethylene glycol powder (GLYCOLAX/MIRALAX) powder DISSOLVE 17 GRAMS (1 CAPFUL) INTO 8 OUNCES OF LIQUID AND DRINK EVERY DAY 527 g 11  . PROAIR HFA 108 (90 BASE) MCG/ACT inhaler INHALE 2 PUFFS BY MOUTH EVERY 4 HOURS AS NEEDED FOR WHEEZING 1 Inhaler 1  . spironolactone (ALDACTONE) 25 MG tablet TAKE 1/2 TABLET BY MOUTH EVERY DAY   NEED OFFICE VISIT 15 tablet 6  . SYMBICORT 160-4.5 MCG/ACT inhaler INHALE 2 PUFFS BY MOUTH EVERY 12 HOURS (FIRST THING IN MORNING THEN 12 HOURS LATER) 1 Inhaler 5  . atorvastatin (LIPITOR) 20 MG tablet Take 1 tablet (20 mg total) by mouth daily. 90 tablet 3   No current facility-administered medications for this encounter.     BP 140/70 mmHg  Pulse 79  Ht 5\' 4"  (1.626 m)  Wt 208 lb  12.8 oz (94.711 kg)  BMI 35.82 kg/m2  SpO2 92% General: NAD Neck: No JVD, no thyromegaly or thyroid nodule.  Lungs: Mildly distant breath sounds.  CV: Nondisplaced PMI.  Heart regular S1/S2, no S3/S4, no murmur.  No peripheral edema.  No carotid bruit.  Normal pedal pulses.  Abdomen: Soft, nontender, no hepatosplenomegaly, no distention.  Skin: Intact without lesions or rashes.  Neurologic: Alert and oriented x 3.  Psych: Normal affect. Extremities: No clubbing or cyanosis.  HEENT: Normal.   Assessment/Plan: 1. Primarily RV failure: I think that this is related to COPD and hypoxemia.  PA pressure not able to be estimated on last echo.  He seems to be doing relatively well.  He is not volume overloaded on exam and has dyspnea only with moderate to heavy exertion,  NYHA class II.   - Continue oxygen at night, and I encouraged him to wear oxygen during the day.  - Check BNP today.  - Continue current Lasix and spironolactone, needs BMET today and should have BMET q3 months with spironolactone use.  - Will get echo to reassess RV and to try to get estimation of PA pressure.  2. CAD: Moderate CAD on LHC in 2006.  No intervention.  He does not have chest pain.   - Continue ASA 81 mg daily.  - Needs to be on statin, will start atorvastatin 20 mg daily with lipids/LFTs in 2 months.   3. HTN: BP borderline elevated, will not change meds today.   Loralie Champagne 11/04/2014

## 2014-11-05 NOTE — Addendum Note (Signed)
Encounter addended by: Asencion Gowda, CCT on: 11/05/2014 10:52 AM<BR>     Documentation filed: Charges VN

## 2014-12-01 ENCOUNTER — Ambulatory Visit (HOSPITAL_COMMUNITY)
Admission: RE | Admit: 2014-12-01 | Discharge: 2014-12-01 | Disposition: A | Discharge status: Home / Self Care | Payer: Commercial Managed Care - HMO | Source: Ambulatory Visit | Attending: Cardiology | Admitting: Cardiology

## 2014-12-01 DIAGNOSIS — I509 Heart failure, unspecified: Secondary | ICD-10-CM | POA: Insufficient documentation

## 2014-12-01 DIAGNOSIS — I10 Essential (primary) hypertension: Secondary | ICD-10-CM | POA: Diagnosis not present

## 2014-12-01 DIAGNOSIS — I2510 Atherosclerotic heart disease of native coronary artery without angina pectoris: Secondary | ICD-10-CM | POA: Insufficient documentation

## 2014-12-01 DIAGNOSIS — J449 Chronic obstructive pulmonary disease, unspecified: Secondary | ICD-10-CM | POA: Diagnosis not present

## 2014-12-01 DIAGNOSIS — Z720 Tobacco use: Secondary | ICD-10-CM | POA: Insufficient documentation

## 2014-12-01 DIAGNOSIS — I5023 Acute on chronic systolic (congestive) heart failure: Secondary | ICD-10-CM

## 2014-12-01 NOTE — Progress Notes (Signed)
  Echocardiogram 2D Echocardiogram has been performed.  Velina Drollinger 12/01/2014, 12:02 PM

## 2014-12-02 ENCOUNTER — Other Ambulatory Visit: Payer: Self-pay | Admitting: Internal Medicine

## 2015-01-11 ENCOUNTER — Ambulatory Visit (INDEPENDENT_AMBULATORY_CARE_PROVIDER_SITE_OTHER): Payer: Commercial Managed Care - HMO | Admitting: Adult Health

## 2015-01-11 ENCOUNTER — Encounter: Payer: Self-pay | Admitting: Adult Health

## 2015-01-11 VITALS — BP 132/74 | HR 55 | Temp 97.6°F | Ht 65.0 in | Wt 210.0 lb

## 2015-01-11 DIAGNOSIS — J449 Chronic obstructive pulmonary disease, unspecified: Secondary | ICD-10-CM

## 2015-01-11 DIAGNOSIS — J9611 Chronic respiratory failure with hypoxia: Secondary | ICD-10-CM | POA: Diagnosis not present

## 2015-01-11 NOTE — Assessment & Plan Note (Signed)
Compensated on present regimen without exacerbation Vaccines are up-to-date  Plan Continue on current regimen Patient is to follow-up in 3-4 months and as needed

## 2015-01-11 NOTE — Patient Instructions (Signed)
Continue on current regimen. Follow up Dr. Melvyn Novas  In 3-4 months and As needed

## 2015-01-11 NOTE — Assessment & Plan Note (Signed)
Compensated-  Plan Continue to use oxygen at bedtime May use with activity as needed

## 2015-01-11 NOTE — Progress Notes (Signed)
Subjective:    Patient ID: John [name], male    DOB: January 23, 1946   MRN: [id number]  Brief patient profile:  42 yobm quit smoking 09/2012 with multiple admits for "aecopd" last one:   Admit date: 12/06/2012  Discharge date: 12/08/2012   Discharge Diagnoses:  COPD exacerbation  HTN (hypertension)  Allergic drug rash  Drug-induced hyperglycemia  CHF exacerbation  Hyperglycemia induced by steroids  CAD   History of Present Illness  12/18/2012 1st pulmonary ov/ [facility] cc doe s 02 up to half a mile s stopping and overall using 02 mostly evening before bed on advair 500/50 q am only and does not know how to use it correctly. Not needing any daytime saba but generally confused on how to use meds.  >change to Advair 250/50    01/11/2013 NP Follow up and medication review   We reviewed all his medications and organized them into a medication calendar with patient education. It appears the patient is taking his medications Better on  Advair with decreased cough and shortness of breath. No wheezing.  rec No change rx  Use med calendar   03/01/2013 f/u ov/Wert cc confused with meds/ med calendar using advair 500 and confused with how to use hfa albuterol but overall doing better rec Advair 250/50 one twice daily = Plan A Only use your albuterol (ventolin = Plan B)   Please see patient coordinator before you leave today  to schedule portable 02      09/15/2013 f/u ov/[facility]ert re: copd/02 dep Chief Complaint  Patient presents with  . Follow-up    Pt states his breathing is unchanged since the last visit. No new co's today. Usinf albuterol HFA approx 3 times per wk.  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 Follow-up    Pt states breathing is doing well. Using proair at least once per day, sometimes twice.   uses the saba once during the day p does his walk x 30 min Confused with names of inhalers and using saba before activity automatically rather than prn  Has symbicort in his pocket as his rescue   Not using 02 at rest, just at night and with ex rec Use 02 2lpm at bedtime and with exertion but ok to leave if off at rest and walking inside your house Only use your albuterol(proair)  as a rescue medication   07/11/2014 f/u ov/Wert re: copd/ ? Medical illiteracy Chief Complaint  Patient presents with  . Follow-up    Pt states he left his O2 in the car. Pt states his breathing is doing better, pt is able to do more activties. Denies cough, CP/tightness.   severely confused again with instructions, maint vs prns  However, denies limited by breathing from desired activities  As long as he walks slow, avoids heat rec Symbicort 160 Take 2 puffs first thing in am and then another 2 puffs about 12 hours later.  Only use your albuterol prn    10/10/2014 1st [facility] office visit/ [facility]   Chief Complaint  Patient presents with  .  Follow-up    Pt reports his breathing is unchanged since his last visit. He c/o bidil making him feel lightheaded.  still a bit confused with symbicort vs proair  Using proair around twice in 24 but not really clear on why/ when he needs it Not limited by breathing from desired activities  But very sedentary  >>no changes   01/11/2015 Follow up GOLD III severe copd/ 02 dep with sleep, outside activity Patient returns for three-month follow-up for COPD Patient remains on Symbicort 2 puffs twice daily Patient feels that his breathing is at baseline He denies any flare cough, wheezing or shortness of breath He does wear oxygen  at bedtime, rarely uses during the daytime with activity. No recent hospitalizations or emergency room visits He denies any chest pain, palpitations, orthopnea, PND or leg swelling We were supposed to review patient's medications today however he did not bring them Says that he is taking his medications correctly .  Current Medications, Allergies, Past Medical History, Past Surgical History, Family History, and Social History were reviewed in [employer] record.  ROS  The following are not active complaints unless bolded sore throat, dysphagia, dental problems, itching, sneezing,  nasal congestion or excess/ purulent secretions, ear ache,   fever, chills, sweats, unintended wt loss, pleuritic or exertional cp, hemoptysis,  orthopnea pnd or leg swelling, presyncope, palpitations, heartburn, abdominal pain, anorexia, nausea, vomiting, diarrhea  or change in bowel or urinary habits, change in stools or urine, dysuria,hematuria,  rash, arthralgias, visual complaints, headache, numbness weakness or ataxia or problems with walking or coordination,  change in mood/affect or memory.                  Objective:   Physical Exam  03/19/2013  Wt 208 vs 06/17/2013 209  09/15/2013  206 > 212 01/10/2014 > 07/11/2014  210  > 10/10/2014 209 >210 01/11/2015    amb bm nad  HEENT mild turbinate edema.  Edentulous,  Oropharynx no thrush or excess pnd or cobblestoning. Edentulous with dentures in place No JVD or cervical adenopathy. Mild accessory muscle hypertrophy. Trachea midline, nl thryroid. Chest was hyperinflated by percussion with diminished breath sounds and moderate increased exp time without wheeze.  Regular rate and rhythm without murmur gallop or rub or increase P2 or edema.  Abd: no hsm, nl excursion. Ext warm without cyanosis or clubbing.       CXR  01/10/2014 :  No active cardiopulmonary disease.       Assessment & Plan:

## 2015-01-26 DIAGNOSIS — J449 Chronic obstructive pulmonary disease, unspecified: Secondary | ICD-10-CM | POA: Diagnosis not present

## 2015-01-31 ENCOUNTER — Other Ambulatory Visit: Payer: Self-pay | Admitting: Internal Medicine

## 2015-02-02 ENCOUNTER — Other Ambulatory Visit: Payer: Self-pay | Admitting: Internal Medicine

## 2015-02-14 DIAGNOSIS — I10 Essential (primary) hypertension: Secondary | ICD-10-CM | POA: Diagnosis not present

## 2015-02-14 DIAGNOSIS — Z0000 Encounter for general adult medical examination without abnormal findings: Secondary | ICD-10-CM | POA: Diagnosis not present

## 2015-02-14 DIAGNOSIS — J449 Chronic obstructive pulmonary disease, unspecified: Secondary | ICD-10-CM | POA: Diagnosis not present

## 2015-02-26 DIAGNOSIS — J449 Chronic obstructive pulmonary disease, unspecified: Secondary | ICD-10-CM | POA: Diagnosis not present

## 2015-03-03 ENCOUNTER — Other Ambulatory Visit: Payer: Self-pay | Admitting: Internal Medicine

## 2015-03-06 ENCOUNTER — Other Ambulatory Visit: Payer: Self-pay | Admitting: Internal Medicine

## 2015-03-13 ENCOUNTER — Telehealth: Payer: Self-pay | Admitting: *Deleted

## 2015-03-13 ENCOUNTER — Ambulatory Visit (INDEPENDENT_AMBULATORY_CARE_PROVIDER_SITE_OTHER): Payer: Commercial Managed Care - HMO | Admitting: Internal Medicine

## 2015-03-13 ENCOUNTER — Ambulatory Visit (INDEPENDENT_AMBULATORY_CARE_PROVIDER_SITE_OTHER)
Admission: RE | Admit: 2015-03-13 | Discharge: 2015-03-13 | Disposition: A | Discharge status: Home / Self Care | Payer: Commercial Managed Care - HMO | Source: Ambulatory Visit | Attending: Internal Medicine | Admitting: Internal Medicine

## 2015-03-13 ENCOUNTER — Encounter: Payer: Self-pay | Admitting: Internal Medicine

## 2015-03-13 VITALS — BP 122/86 | HR 80 | Ht 65.5 in | Wt 212.0 lb

## 2015-03-13 DIAGNOSIS — J9611 Chronic respiratory failure with hypoxia: Secondary | ICD-10-CM

## 2015-03-13 DIAGNOSIS — R918 Other nonspecific abnormal finding of lung field: Secondary | ICD-10-CM | POA: Diagnosis not present

## 2015-03-13 DIAGNOSIS — J449 Chronic obstructive pulmonary disease, unspecified: Secondary | ICD-10-CM

## 2015-03-13 DIAGNOSIS — R911 Solitary pulmonary nodule: Secondary | ICD-10-CM | POA: Insufficient documentation

## 2015-03-13 MED ORDER — ALBUTEROL SULFATE HFA 108 (90 BASE) MCG/ACT IN AERS: INHALATION_SPRAY | RESPIRATORY_TRACT | Status: DC

## 2015-03-13 MED ORDER — ALBUTEROL SULFATE HFA 108 (90 BASE) MCG/ACT IN AERS: INHALATION_SPRAY | RESPIRATORY_TRACT | Status: AC

## 2015-03-13 NOTE — Assessment & Plan Note (Addendum)
-   new 03/13/2015 since previous cxr 01/10/14   Needs CT with contrast but check repeat bmet first   Discussed in detail all the  indications, usual  risks and alternatives  relative to the benefits with patient who agrees to proceed with ct Chest

## 2015-03-13 NOTE — Progress Notes (Signed)
Subjective:    Patient ID: John [name], male    DOB: [birth year]   MRN: [id number]  Brief patient profile:  68 yobm quit smoking 09/2012 with GOLD III copd 01/2013 and  with multiple admits for "aecopd" last one:   Admit date: 12/06/2012  Discharge date: 12/08/2012   Discharge Diagnoses:  COPD exacerbation  HTN (hypertension)  Allergic drug rash  Drug-induced hyperglycemia  CHF exacerbation  Hyperglycemia induced by steroids  CAD   History of Present Illness  12/18/2012 1st pulmonary ov/ [facility] cc doe s 02 up to half a mile s stopping and overall using 02 mostly evening before bed on advair 500/50 q am only and does not know how to use it correctly. Not needing any daytime saba but generally confused on how to use meds.  >change to Advair 250/50    01/11/2013 NP Follow up and medication review   We reviewed all his medications and organized them into a medication calendar with patient education. It appears the patient is taking his medications Better on  Advair with decreased cough and shortness of breath. No wheezing.  rec No change rx  Use med calendar   03/01/2013 f/u ov/Wert cc confused with meds/ med calendar using advair 500 and confused with how to use hfa albuterol but overall doing better rec Advair 250/50 one twice daily = Plan A Only use your albuterol (ventolin = Plan B)   Please see patient coordinator before you leave today  to schedule portable 02      09/15/2013 f/u ov/Wert re: copd/02 dep Chief Complaint  Patient presents with  . Follow-up    Pt states his breathing is unchanged since the last visit. No new co's today. Usinf albuterol HFA approx 3 times per wk.  not really limited from desired activities rec Stop carvedilol (coreg) and substitute with bisoprolol 5 mg  One daily  Continue symbicort 160 Take 2 puffs first thing in am and then another 2 puffs about 12 hours later Remember don't leave home without your rescue inhaler (ventolin) Rec:  02  2lpm sleeping and walking outside the house   01/10/2014 f/u ov/Wert re: GOLD III copd/ 02 dep with sleep, outside activity Chief Complaint  Patient presents with  . Follow-up    Pt states breathing is doing well. Using proair at least once per day, sometimes twice.   uses the saba once during the day p does his walk x 30 min Confused with names of inhalers and using saba before activity automatically rather than prn  Has symbicort in his pocket as his rescue   Not using 02 at rest, just at night and with ex rec Use 02 2lpm at bedtime and with exertion but ok to leave if off at rest and walking inside your house Only use your albuterol(proair)  as a rescue medication   07/11/2014 f/u ov/Wert re: copd/ ? Medical illiteracy Chief Complaint  Patient presents with  . Follow-up    Pt states he left his O2 in the car. Pt states his breathing is doing better, pt is able to do more activties. Denies cough, CP/tightness.   severely confused again with instructions, maint vs prns  However, denies limited by breathing from desired activities  As long as he walks slow, avoids heat rec Symbicort 160 Take 2 puffs first thing in am and then another 2 puffs about 12 hours later.  Only use your albuterol prn        03/13/2015 f/u ov/Wert re:  copd GOLD III/ confused with meds / no med calendar  Chief Complaint  Patient presents with  . Follow-up    Pt states that his breathing is unchanged since his last visit.   avg use rescue only once or twice daily esp a problem with bending over or if gets in a big hurry but ok with slow paced flat walking/ adls  Using 02 just at hs , not with ambulation  Has saba and respiclick and not sure which one to use but likes hfa better    No obvious day to day or daytime variabilty or assoc chronic cough or cp or chest tightness, subjective wheeze overt sinus or hb symptoms. No unusual exp hx or h/o childhood pna/ asthma or knowledge of premature  birth.  Sleeping ok without nocturnal  or early am exacerbation  of respiratory  c/o's or need for noct saba. Also denies any obvious fluctuation of symptoms with weather or environmental changes or other aggravating or alleviating factors except as outlined above   Current Medications, Allergies, Complete Past Medical History, Past Surgical History, Family History, and Social History were reviewed in [employer] record.  ROS  The following are not active complaints unless bolded sore throat, dysphagia, dental problems, itching, sneezing,  nasal congestion or excess/ purulent secretions, ear ache,   fever, chills, sweats, unintended wt loss, pleuritic or exertional cp, hemoptysis,  orthopnea pnd or leg swelling, presyncope, palpitations, heartburn, abdominal pain, anorexia, nausea, vomiting, diarrhea  or change in bowel or urinary habits, change in stools or urine, dysuria,hematuria,  rash, arthralgias, visual complaints, headache, numbness weakness or ataxia or problems with walking or coordination,  change in mood/affect or memory.                        Objective:   Physical Exam  03/19/2013  Wt 208 vs 06/17/2013 209  09/15/2013  206 > 212 01/10/2014 > 07/11/2014  210  > 10/10/2014 209 >210 01/11/2015 > 03/13/2015 212    amb bm nad  HEENT mild turbinate edema.  Edentulous,  Oropharynx no thrush or excess pnd or cobblestoning. Edentulous with dentures in place No JVD or cervical adenopathy. Mild accessory muscle hypertrophy. Trachea midline, nl thryroid. Chest was hyperinflated by percussion with diminished breath sounds and moderate increased exp time without wheeze.  Regular rate and rhythm without murmur gallop or rub or increase P2 or edema.  Abd: no hsm, nl excursion. Ext warm without cyanosis or clubbing.       CXR PA and Lateral:   03/13/2015 :     I personally reviewed images and agree with radiology impression as follows:    New right mid lung nodule.  Further evaluation by means of CT of the chest is recommended.      Assessment & Plan:

## 2015-03-13 NOTE — Patient Instructions (Addendum)
symbicort 160 Take 2 puffs first thing in am and then another 2 puffs about 12 hours later.   Only use your albuterol (proair) as a rescue medication to be used if you can't catch your breath by resting or doing a relaxed purse lip breathing pattern.  - The less you use it, the better it will work when you need it. - Ok to use up to 2 puffs  every 4 hours if you must but call for immediate appointment if use goes up over your usual need - Don't leave home without it !!  (think of it like the spare tire for your car)  - since like hfa better use up the respiclick and don't refill   Please remember to go to the  x-ray department downstairs for your tests - we will call you with the results when they are available.  Please schedule a follow up visit in 3 months but call sooner if needed with med calendar on return

## 2015-03-13 NOTE — Telephone Encounter (Signed)
Call report for CXR done today.  Shows new lung nodule. Rec CT chest. Please advise MW thanks

## 2015-03-13 NOTE — Assessment & Plan Note (Signed)
-   12/18/2012  Walked RA  2 laps @ 185 ft each stopped due to  desat 87% - 03/19/2013  Walked 2lpm x 3 laps @ 185 ft each stopped due to  End of study, sat 91% - 06/17/2013   Walked RA x one lap @ 185 stopped due to desat to 87 when started second lap -09/15/2013   Walked RA x one lap @ 185 stopped due to sob, no desat   Rec:  02 2lpm sleeping  and with exertion outside the house as unlikely to desat at rest or indoor activity but has cor pulmonale so def should cont at  hs

## 2015-03-13 NOTE — Assessment & Plan Note (Addendum)
-   PFT's 02/05/13 FEV1  1.15 (41%) ratio 48 and 22% better p B2,  DLCO  61 corrects to 81%    The proper method of use, as well as anticipated side effects, of a metered-dose inhaler are discussed and demonstrated to the patient. Improved effectiveness after extensive coaching during this visit to a level of approximately  90%   I had an extended discussion with the patient reviewing all relevant studies completed to date and  lasting 15 to 20 minutes of a 25 minute visit on the following ongoing concerns:   1) continues to be easily confused and not using med calendar/ I suspect he is medically illiterate   2) prefers hfa so asked him to use up the respiclick and just use the proair hfa as his backup once the respiclick is gone but not use both  3) Each maintenance medication was reviewed in detail including most importantly the difference between maintenance and as needed and under what circumstances the prns are to be used.  Please see instructions for details which were reviewed in writing and the patient given a copy.    4) could add lama if he starts being more sob with desired activities

## 2015-03-14 ENCOUNTER — Telehealth: Payer: Self-pay | Admitting: Internal Medicine

## 2015-03-14 NOTE — Telephone Encounter (Signed)
Called and spoke to pt. Pt stated he will need to come back to office to have blood drawn. BMP in epic to be collected before CT wit contrast. Informed pt to come by this week to have lab drawn before CT. Pt verbalized understanding and denied any further questions or concerns at this time.

## 2015-03-15 ENCOUNTER — Other Ambulatory Visit (INDEPENDENT_AMBULATORY_CARE_PROVIDER_SITE_OTHER): Payer: Commercial Managed Care - HMO

## 2015-03-15 DIAGNOSIS — R918 Other nonspecific abnormal finding of lung field: Secondary | ICD-10-CM

## 2015-03-15 LAB — BASIC METABOLIC PANEL
BUN: 14 mg/dL (ref 6–23)
CALCIUM: 9.6 mg/dL (ref 8.4–10.5)
CO2: 30 mEq/L (ref 19–32)
CREATININE: 1.23 mg/dL (ref 0.40–1.50)
Chloride: 101 mEq/L (ref 96–112)
GFR: 74.76 mL/min (ref >60.00)
GLUCOSE: 129 mg/dL — AB (ref 70–99)
Potassium: 3.6 mEq/L (ref 3.5–5.1)
SODIUM: 140 meq/L (ref 135–145)

## 2015-03-20 ENCOUNTER — Inpatient Hospital Stay: Admission: RE | Admit: 2015-03-20 | Payer: Commercial Managed Care - HMO | Source: Ambulatory Visit

## 2015-03-20 ENCOUNTER — Other Ambulatory Visit: Payer: Commercial Managed Care - HMO

## 2015-03-20 ENCOUNTER — Telehealth: Payer: Self-pay | Admitting: Internal Medicine

## 2015-03-20 NOTE — Telephone Encounter (Signed)
Called and spoke to patient, he was not aware of appointment.  Patient given Stacy's phone number to call and re-schedule appointment.  Patient calling today to reschedule.  Nothing further needed.

## 2015-03-20 NOTE — Telephone Encounter (Signed)
Stacy from ct cb, states she tried to call pt and he just picks up and doesn't say anything, I myself tried to call the patient to have hi  Call to resched appt and speak to stacy and didn't get an answer, per [facility] appt has been canceled due to not being able to reach pt. No cb neccessary.

## 2015-03-20 NOTE — Telephone Encounter (Signed)
Called and LM for Stacy regarding a good working number for patient [phone] (H)

## 2015-03-27 ENCOUNTER — Ambulatory Visit (INDEPENDENT_AMBULATORY_CARE_PROVIDER_SITE_OTHER)
Admission: RE | Admit: 2015-03-27 | Discharge: 2015-03-27 | Disposition: A | Discharge status: Home / Self Care | Payer: Commercial Managed Care - HMO | Source: Ambulatory Visit | Attending: Internal Medicine | Admitting: Internal Medicine

## 2015-03-27 DIAGNOSIS — R918 Other nonspecific abnormal finding of lung field: Secondary | ICD-10-CM

## 2015-03-27 DIAGNOSIS — J432 Centrilobular emphysema: Secondary | ICD-10-CM | POA: Diagnosis not present

## 2015-03-27 DIAGNOSIS — R911 Solitary pulmonary nodule: Secondary | ICD-10-CM | POA: Diagnosis not present

## 2015-03-27 DIAGNOSIS — J449 Chronic obstructive pulmonary disease, unspecified: Secondary | ICD-10-CM | POA: Diagnosis not present

## 2015-03-27 DIAGNOSIS — I2510 Atherosclerotic heart disease of native coronary artery without angina pectoris: Secondary | ICD-10-CM | POA: Diagnosis not present

## 2015-03-27 MED ORDER — IOHEXOL 300 MG/ML  SOLN
80.0000 mL | Freq: Once | INTRAMUSCULAR | Status: AC | PRN
  Administered 2015-03-27: 80 mL via INTRAVENOUS

## 2015-04-03 ENCOUNTER — Other Ambulatory Visit: Payer: Self-pay | Admitting: Internal Medicine

## 2015-04-03 DIAGNOSIS — R918 Other nonspecific abnormal finding of lung field: Secondary | ICD-10-CM

## 2015-04-03 NOTE — Progress Notes (Signed)
Quick Note:  PET ordered ______

## 2015-04-13 ENCOUNTER — Ambulatory Visit (HOSPITAL_COMMUNITY)
Admission: RE | Admit: 2015-04-13 | Discharge: 2015-04-13 | Disposition: A | Discharge status: Home / Self Care | Payer: Commercial Managed Care - HMO | Source: Ambulatory Visit | Attending: Internal Medicine | Admitting: Internal Medicine

## 2015-04-13 DIAGNOSIS — R918 Other nonspecific abnormal finding of lung field: Secondary | ICD-10-CM

## 2015-04-13 DIAGNOSIS — R911 Solitary pulmonary nodule: Secondary | ICD-10-CM | POA: Diagnosis not present

## 2015-04-13 DIAGNOSIS — R590 Localized enlarged lymph nodes: Secondary | ICD-10-CM | POA: Diagnosis not present

## 2015-04-13 LAB — GLUCOSE, CAPILLARY: GLUCOSE-CAPILLARY: 110 mg/dL — AB (ref 70–99)

## 2015-04-13 MED ORDER — FLUDEOXYGLUCOSE F - 18 (FDG) INJECTION
11.6000 | Freq: Once | INTRAVENOUS | Status: AC | PRN
  Administered 2015-04-13: 11.6 via INTRAVENOUS

## 2015-04-14 NOTE — Progress Notes (Signed)
Quick Note:  appt made ______

## 2015-04-17 ENCOUNTER — Telehealth: Payer: Self-pay | Admitting: Endocrinology

## 2015-04-17 NOTE — Telephone Encounter (Signed)
John [name] is calling for a [employer] referral to pulmo- dr wert. appt is on Wednesday .  ICD 10 code - R91.8

## 2015-04-18 NOTE — Telephone Encounter (Signed)
[employer] auth # [id number] valid 04/17/15-10/14/15 for 6 visits

## 2015-04-20 ENCOUNTER — Encounter: Payer: Self-pay | Admitting: Internal Medicine

## 2015-04-20 ENCOUNTER — Ambulatory Visit (INDEPENDENT_AMBULATORY_CARE_PROVIDER_SITE_OTHER): Payer: Commercial Managed Care - HMO | Admitting: Internal Medicine

## 2015-04-20 VITALS — BP 144/78 | HR 80 | Ht 65.5 in | Wt 213.8 lb

## 2015-04-20 DIAGNOSIS — J449 Chronic obstructive pulmonary disease, unspecified: Secondary | ICD-10-CM | POA: Diagnosis not present

## 2015-04-20 DIAGNOSIS — R918 Other nonspecific abnormal finding of lung field: Secondary | ICD-10-CM

## 2015-04-20 DIAGNOSIS — J441 Chronic obstructive pulmonary disease with (acute) exacerbation: Secondary | ICD-10-CM | POA: Diagnosis not present

## 2015-04-20 DIAGNOSIS — J9611 Chronic respiratory failure with hypoxia: Secondary | ICD-10-CM | POA: Diagnosis not present

## 2015-04-20 DIAGNOSIS — R911 Solitary pulmonary nodule: Secondary | ICD-10-CM

## 2015-04-20 NOTE — Patient Instructions (Signed)
Please see patient coordinator before you leave today  to schedule thoracic surgery evaluation   symbicort 160 Take 2 puffs first thing in am and then another 2 puffs about 12 hours later.   Only use your albuterol as a rescue medication to be used if you can't catch your breath by resting or doing a relaxed purse lip breathing pattern.  - The less you use it, the better it will work when you need it. - Ok to use up to 2 puffs  every 4 hours if you must but call for immediate appointment if use goes up over your usual need - Don't leave home without it !!  (think of it like the spare tire for your car)

## 2015-04-20 NOTE — Progress Notes (Signed)
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 Not needing any daytime saba but generally confused on how to use meds.  >change to Advair 250/50     03/01/2013 f/u ov/Kuba Shepherd cc confused with meds/ med calendar using advair 500 and confused with how to use hfa albuterol but overall doing better rec Advair 250/50 one twice daily = Plan A Only use your albuterol (ventolin = Plan B)   Please see patient coordinator before you leave today  to schedule portable 02      09/15/2013 f/u ov/Conner Muegge re: copd/02 dep Chief Complaint  Patient presents with  . Follow-up    Pt states his breathing is unchanged since the last visit. No new co's today. Usinf albuterol HFA approx 3 times per wk.  not really limited from desired activities rec Stop carvedilol (coreg) and substitute with bisoprolol 5 mg  One daily  Continue symbicort 160 Take 2 puffs first thing in am and then another 2 puffs about 12 hours later Remember don't leave home without your rescue inhaler (ventolin) Rec:  02 2lpm sleeping and walking outside the house     03/13/2015 f/u ov/Calyn Rubi re: copd GOLD III/ confused with meds / no med calendar  Chief Complaint  Patient presents with  . Follow-up    Pt states that his breathing is unchanged since his last visit.   avg use rescue only once or twice daily esp a problem with bending  over or if gets in a big hurry but ok with slow paced flat walking/ adls  Using 02 just at hs , not with ambulation  Has saba and respiclick and not sure which one to use but likes hfa better  rec symbicort 160 Take 2 puffs first thing in am and then another 2 puffs about 12 hours later.  Only use your albuterol (proair) as a rescue medication  Please remember to go to the  x-ray department  > Pos SPN > PET pos.     04/20/2015 f/u ov/Erling Arrazola re: copd GOLD II/ spn RLL with ? Pos node ant med  Not using symbicort 160 consistently  / no sob slow and flat despite missing meds   No obvious day to day or daytime variabilty or assoc chronic cough or cp or chest tightness, subjective wheeze overt sinus or hb symptoms. No unusual exp hx or h/o childhood pna/ asthma or knowledge of premature birth.  Sleeping ok without nocturnal  or early am exacerbation  of respiratory  c/o's or need for noct saba. Also denies any obvious fluctuation of symptoms with weather or environmental changes or other aggravating or alleviating factors except as outlined above   Current Medications, Allergies, Complete Past Medical History, Past Surgical History, Family History, and Social History were reviewed in [employer] record.  ROS  The  following are not active complaints unless bolded sore throat, dysphagia, dental problems, itching, sneezing,  nasal congestion or excess/ purulent secretions, ear ache,   fever, chills, sweats, unintended wt loss, pleuritic or exertional cp, hemoptysis,  orthopnea pnd or leg swelling, presyncope, palpitations, heartburn, abdominal pain, anorexia, nausea, vomiting, diarrhea  or change in bowel or urinary habits, change in stools or urine, dysuria,hematuria,  rash, arthralgias, visual complaints, headache, numbness weakness or ataxia or problems with walking or coordination,  change in mood/affect or memory.                  Objective:   Physical Exam  03/19/2013   Wt 208 vs 06/17/2013 209  09/15/2013  206 > 212 01/10/2014 > 07/11/2014  210  > 10/10/2014 209 >210 01/11/2015 > 03/13/2015 212 > 04/20/2015  213   amb bm nad  HEENT mild turbinate edema.  Edentulous,  Oropharynx no thrush or excess pnd or cobblestoning. Edentulous with dentures in place No JVD or cervical adenopathy. Mild accessory muscle hypertrophy. Trachea midline, nl thryroid. Chest was hyperinflated by percussion with diminished breath sounds and moderate increased exp time without wheeze.  Regular rate and rhythm without murmur gallop or rub or increase P2 or edema.  Abd: quite protuberant/ no hsm, nl excursion. Ext warm without cyanosis or clubbing.       CXR PA and Lateral:   03/13/2015 :     I personally reviewed images and agree with radiology impression as follows:    New right mid lung nodule. Further evaluation by means of CT of the chest is recommended.  PET  04/13/15  2.1 cm hypermetabolic nodule in the superior segment of the right lower lobe, consistent with primary bronchogenic carcinoma.  Single 10 mm prevascular anterior mediastinal lymph node is hypermetabolic; no other hypermetabolic nodes are identified in the thorax. Differential diagnosis includes reactive and metastatic lymphadenopathy.     Assessment & Plan:

## 2015-04-23 ENCOUNTER — Encounter: Payer: Self-pay | Admitting: Internal Medicine

## 2015-04-23 NOTE — Assessment & Plan Note (Signed)
-   12/18/2012  Walked RA  2 laps @ 185 ft each stopped due to  desat 87% - 03/19/2013  Walked 2lpm x 3 laps @ 185 ft each stopped due to  End of study, sat 91% - 06/17/2013   Walked RA x one lap @ 185 stopped due to desat to 87 when started second lap -09/15/2013   Walked RA x one lap @ 185 stopped due to sob, no desat  - 04/20/2015  Walked RA x 3 laps @ 185 ft each stopped due to  End of study, no pace, no desat   Rec:  As of 04/20/15 02 2lpm sleeping  Only

## 2015-04-23 NOTE — Assessment & Plan Note (Signed)
-   new 03/13/2015 since previous cxr 01/10/14  - CT chest 03/27/15 c/w primary bronchogenic ca>  -  PET 04/13/2015 >5.0 cm hypermetabolic nodule in the superior segment of the right lower lobe, consistent with primary bronchogenic carcinoma. Single 10 mm prevascular anterior mediastinal lymph node is Hypermetabolic > referred to T surgery 04/20/2015   I had an extended discussion with the patient and daughter reviewing all relevant studies    1) He may not have resectable dz but always like to give the benefit of the doubt and needs a tissue dx ? To go after the ant node first ?  2) he could tolerate segmentectomy but not much more  3) Discussed in detail all the  indications, usual  risks and alternatives  relative to the benefits with patient who agrees to proceed with  referral to T surgergy

## 2015-04-23 NOTE — Assessment & Plan Note (Signed)
-   PFT's 02/05/13 FEV1  1.46  (50%) ratio 55 p 22% better p B2,  DLCO  61 corrects to 81%  -  04/20/2015  Walked RA x 3 laps @ 185 ft each stopped due to  End study, no sob, nl pace  - Spirometry 04/20/2015 >  FEV1  1.21 (48%) ratio 58 before am rx   The proper method of use, as well as anticipated side effects, of a metered-dose inhaler are discussed and demonstrated to the patient. Improved effectiveness after extensive coaching during this visit to a level of approximately  90% from a baseline of 75%   I had an extended discussion with the patient reviewing all relevant studies completed to date and  lasting 15 to 20 minutes of a 25 minute visit on the following ongoing concerns:   1) he's doing ok now but would def benefit from more aggressive/ consistent rx with symbicort and may even add lama next  2) Each maintenance medication was reviewed in detail including most importantly the difference between maintenance and as needed and under what circumstances the prns are to be used.  Please see instructions for details which were reviewed in writing and the patient given a copy.

## 2015-04-25 ENCOUNTER — Other Ambulatory Visit: Payer: Self-pay | Admitting: Internal Medicine

## 2015-04-27 ENCOUNTER — Encounter: Payer: Commercial Managed Care - HMO | Admitting: Thoracic Surgery (Cardiothoracic Vascular Surgery)

## 2015-04-27 ENCOUNTER — Encounter: Payer: Self-pay | Admitting: Thoracic Surgery (Cardiothoracic Vascular Surgery)

## 2015-04-27 DIAGNOSIS — J449 Chronic obstructive pulmonary disease, unspecified: Secondary | ICD-10-CM | POA: Diagnosis not present

## 2015-04-27 NOTE — Progress Notes (Signed)
This encounter was created in error - please disregard.  This encounter was created in error - please disregard.

## 2015-05-01 NOTE — Telephone Encounter (Signed)
Ok to refill for now til sort out his primary

## 2015-05-09 ENCOUNTER — Institutional Professional Consult (permissible substitution) (INDEPENDENT_AMBULATORY_CARE_PROVIDER_SITE_OTHER): Payer: Commercial Managed Care - HMO | Admitting: Thoracic Surgery (Cardiothoracic Vascular Surgery)

## 2015-05-09 ENCOUNTER — Encounter: Payer: Self-pay | Admitting: Thoracic Surgery (Cardiothoracic Vascular Surgery)

## 2015-05-09 VITALS — BP 140/57 | HR 67 | Resp 20 | Ht 65.5 in | Wt 212.0 lb

## 2015-05-09 DIAGNOSIS — R918 Other nonspecific abnormal finding of lung field: Secondary | ICD-10-CM | POA: Diagnosis not present

## 2015-05-09 NOTE — Progress Notes (Signed)
PCP is No PCP Per Patient Referring Provider is John Rockers, MD  Chief Complaint  Patient presents with  . Lung Lesion    Surgical eval, PET Scan 04/13/15, Chest CT 03/27/15    HPI: 69 year old man sent for consultation regarding right lower lobe lung nodule.  [name] is a 69 year old gentleman with a history of heavy tobacco abuse (90 pack years prior to quitting in 2013). His past medical history is significant for coronary disease, MI, congestive heart failure, pulmonary hypertension, systemic hypertension, diabetes, and COPD. He saw Dr. Melvyn Novas in March for a scheduled follow-up visit. At that time he was doing fairly well symptomatically. However a chest x-ray showed a new lung nodule in the right lung. A CT of the chest confirmed a nodule in the superior segment of the right lower lobe. A PET CT showed the nodule was hypermetabolic. There also was an area of hypermetabolism below the manubrium anterior to the innominate vein which was read as adenopathy.  Mr. Casher denies change in appetite or weight loss. He denies any recent chest pain. He does get short of breath with exertion such as mowing his lawn. He has not had any recent exacerbations of the COPD. He does use a rescue inhaler once a day on average. He has an occasional cough, nonproductive. He denies hemoptysis.  Zubrod Score: At the time of surgery this patient's most appropriate activity status/level should be described as: '[]'$     0    Normal activity, no symptoms '[x]'$     1    Restricted in physical strenuous activity but ambulatory, able to do out light work '[]'$     2    Ambulatory and capable of self care, unable to do work activities, up and about >50 % of waking hours                              '[]'$     3    Only limited self care, in bed greater than 50% of waking hours '[]'$     4    Completely disabled, no self care, confined to bed or chair '[]'$     5    Moribund   Past Medical History  Diagnosis Date  . CHF (congestive  heart failure)   . MI (myocardial infarction)   . COPD (chronic obstructive pulmonary disease)   . Diabetes mellitus   . Hypertension   . Gout   . Hyperlipemia     Past Surgical History  Procedure Laterality Date  . Appendectomy       Family History  positive for heart disease in his mother Denies cancer, hypertension, diabetes  Social History History  Substance Use Topics  . Smoking status: Former Smoker -- 1.50 packs/day for 60 years    Types: Cigarettes    Quit date: 09/29/2012  . Smokeless tobacco: Never Used  . Alcohol Use: No   Medications  Albuterol inhaler Allopurinol Amlodipine Aspirin Atorvastatin Bisoprolol Colchicine Fenofibrate Lasix Neurologic Spironolactone Symbicort    Allergies  Allergen Reactions  . Levaquin [Levofloxacin In D5w]   . Lisinopril Swelling    Review of Systems  Constitutional: Negative for fever, chills, activity change, appetite change and unexpected weight change.  Respiratory: Positive for shortness of breath (with exertion) and wheezing (occasional).        Home O2 at night  Cardiovascular: Negative for chest pain and leg swelling.  Gastrointestinal: Negative.   Genitourinary: Negative.  Musculoskeletal: Positive for arthralgias (gout right hand, left leg).  Neurological: Negative.   Hematological: Negative for adenopathy. Does not bruise/bleed easily.  All other systems reviewed and are negative.   BP 140/57 mmHg  Pulse 67  Resp 20  Ht 5' 5.5" (1.664 m)  Wt 212 lb (96.163 kg)  BMI 34.73 kg/m2  SpO2 91% Physical Exam  Constitutional: He is oriented to person, place, and time. He appears well-developed and well-nourished. No distress.  HENT:  Head: Normocephalic and atraumatic.  Cardiovascular: Normal rate and regular rhythm.  Exam reveals no friction rub.   Murmur (2/6 systolic) heard. Pulmonary/Chest: Effort normal. He has no wheezes.  diminished BS bilaterally  Abdominal: Soft. There is no  tenderness.  Musculoskeletal: He exhibits no edema.  Neurological: He is alert and oriented to person, place, and time. No cranial nerve deficit.  No focal motor deficit  Skin: Skin is warm and dry.  Vitals reviewed.    Diagnostic Tests: NUCLEAR MEDICINE PET SKULL BASE TO THIGH  TECHNIQUE: 11.6 mCi F-18 FDG was injected intravenously. Full-ring PET imaging was performed from the skull base to thigh after the radiotracer. CT data was obtained and used for attenuation correction and anatomic localization.  FASTING BLOOD GLUCOSE: Value: 110 mg/dl  COMPARISON: Chest CT on 03/27/2015 and earlier CT of 04/25/2010  FINDINGS: NECK  No hypermetabolic lymph nodes in the neck.  CHEST  2.1 cm spiculated nodule in the superior segment right lower lobe is hypermetabolic, with SUV max of 11.3. Other tiny scattered sub-cm pulmonary nodules seen bilaterally are stable and show absence of hypermetabolic activity.  No hypermetabolic hilar lymph nodes are seen. There is a prevascular anterior mediastinal lymph node which measures approximately 10 mm in short axis on image 53 of series 4. This shows hypermetabolic activity, with SUV max of 4.9. No other hypermetabolic mediastinal lymph nodes identified.  ABDOMEN/PELVIS  No abnormal hypermetabolic activity within the liver, pancreas, adrenal glands, or spleen. No hypermetabolic lymph nodes in the abdomen or pelvis.  A focal area of hypermetabolism is seen in the central peripheral zone near the base the prostate which has SUV max of 6.4. This is nonspecific and could be due to prostate carcinoma or prostatitis.  Also noted on this exam is cholelithiasis, nonobstructive right nephrolithiasis, and probable mild diverticulosis, without acute diverticulitis.  SKELETON  No focal hypermetabolic activity to suggest skeletal metastasis.  IMPRESSION: 2.1 cm hypermetabolic nodule in the superior segment of the right lower  lobe, consistent with primary bronchogenic carcinoma.  Single 10 mm prevascular anterior mediastinal lymph node is hypermetabolic; no other hypermetabolic nodes are identified in the thorax. Differential diagnosis includes reactive and metastatic lymphadenopathy.  No evidence of metastatic disease within the neck, abdomen, or pelvis.  Focus of hypermetabolism in the central prostatic peripheral zone near the prostatic base. This is nonspecific, and could be due to prostate carcinoma or prostatitis. Consider correlation with serum PSA level and Urology referral.  Incidental findings including cholelithiasis, nonobstructive right nephrolithiasis, and mild diverticulosis.   Electronically Signed  By: Earle Gell M.D.  On: 04/13/2015 16:45  ECHOCARDIOGRAM Study Conclusions  - Left ventricle: The cavity size was normal. Wall thickness was increased in a pattern of moderate LVH. Systolic function was normal. The estimated ejection fraction was in the range of 55% to 60%. Wall motion was normal; there were no regional wall motion abnormalities. Doppler parameters are consistent with abnormal left ventricular relaxation (grade 1 diastolic dysfunction). The E/e&' ratio is between 8-15, suggesting indeterminate LV  filling pressure. - Left atrium: The atrium was normal in size. - Right ventricle: The cavity size was normal. Wall thickness was normal. Systolic function is reduced. - Right atrium: The atrium was normal in size. - Inferior vena cava: The vessel was normal in size. The respirophasic diameter changes were in the normal range (>= 50%), consistent with normal central venous pressure.  Impressions:  - Compared to the prior echo in 2013, LV function is somewhat improved to 55%. There is mild RV dysfunction, LV diastolic dysfunction and indeterminate LV filling pressure.  SPIROMETRY  FVC= 2.17(67%) FEV1=  1.16(46%)  Impression: 69 year old man with a history of heavy tobacco abuse who has a new hypermetabolic right lower lobe nodule. This is highly suspicious for primary bronchogenic carcinoma. It has to be considered lung cancer unless it can be proven otherwise.   He has a complicated medical history with multiple severe comorbidities, including COPD, pulmonary hypertension, hypertension, coronary artery disease, and congestive heart failure (left and right). We know he has severe COPD with an FEV1 of 1.16 which is 46% of predicted. That would likely preclude a lobectomy, but he probably could tolerate a segmentectomy from the standpoint of pulmonary reserve. He would need formal PFTs with bronchodilators prior to surgery.  Of greater concern is his pulmonary hypertension. He does not have any overt signs or symptoms of right heart failure currently area, but he has pulmonary hypertension dating back to at least 2006. His PA pressures at that time were 42/19. His most recent echocardiogram did show impaired right ventricular systolic function and his pulmonary arteries are markedly enlarged on his CT. He also had moderate coronary disease in 2006 with multiple 50-60% stenoses. He has extensive calcification of his coronaries on CT and PET/CT. He would definitely need left and right heart catheterization prior to consideration for surgical resection.  The other extenuating factor in his case is the anterior mediastinal hypermetabolism on PET. I am not sure what to make of that finding. That would be a very unusual place for a lung cancer metastasis in the absence of other metastatic disease. Unfortunately that area is not optically amenable to biopsy. For now I would follow that area and consider the right lower lobe nodule separately.  I had a long discussion with Mr. Kloster and his daughter. We discussed potential treatment options including surgical resection and radiation therapy. Although an anatomic  surgical resection would provide a better potential cure rate than SBRT, when taken together with perioperative morbidity and mortality his overall odds are likely as good or better with radiation. They understand he would be a high-risk surgical candidate with potential complications such as death, MI, DVT, PE, bleeding, possible need for transfusion, and infections.  The right lower lobe nodule would be amenable to a needle biopsy. However, even that has some risk involved in the setting of pulmonary hypertension. I think would be best for him to meet with radiation oncology prior to biopsy to make sure that that is how he would like to proceed before subjecting him to the procedure.  Plan: Radiation oncology consultation  Will discuss with radiation oncology after they have seen him. If he is a candidate for S/P RT can arrange CT-guided needle biopsy. If not we'll discuss additional pulmonary function testing and cardiology evaluation.  Melrose Nakayama, MD [facility] [phone]

## 2015-05-11 ENCOUNTER — Ambulatory Visit: Payer: Commercial Managed Care - HMO | Admitting: Physical Therapy

## 2015-05-11 ENCOUNTER — Ambulatory Visit
Admission: RE | Admit: 2015-05-11 | Discharge: 2015-05-11 | Disposition: A | Discharge status: Home / Self Care | Payer: Commercial Managed Care - HMO | Source: Ambulatory Visit | Attending: Radiation Oncology | Admitting: Radiation Oncology

## 2015-05-12 ENCOUNTER — Telehealth: Payer: Self-pay | Admitting: *Deleted

## 2015-05-12 ENCOUNTER — Encounter: Payer: Self-pay | Admitting: *Deleted

## 2015-05-12 NOTE — [facility] Oncology Navigator Note (Unsigned)
Patient was scheduled for thoracic clinic yesterday.  Due to transportation issues, he could not make it.  Dr. Pablo Ledger request patient be scheduled next week for Rad Onc.  I checked patient is already scheduled to see Dr. Tammi Klippel 05/24/15.  I will call to follow up and make sure patient has transportation.

## 2015-05-12 NOTE — Telephone Encounter (Signed)
Called patient to see if he was aware of appt to see Dr. Tammi Klippel on 5/25.  He stated yes, that his daughter wrote it down.  I asked if he had transportation to appt and he stated yes, his daughter will bring him.  He was thankful for the phone call.

## 2015-05-17 ENCOUNTER — Encounter: Payer: Self-pay | Admitting: Radiation Oncology

## 2015-05-17 NOTE — Progress Notes (Signed)
Thoracic Location of Tumor / Histology: highly suspicious for primary bronchogenic carcinoma  Patient was seen by dr. Melvyn Novas in March for a scheduled follow up visit. At that time he was doing fairly well symptomatically. However, a chest xray showed a new lung nodule in the right lung  Patient to meet with radiation oncology prior to biopsy "to make sure that is how he would like to proceed before subjecting him to the procedure."  Tobacco/Marijuana/Snuff/ETOH use: Former smoker; quit in 2013  Past/Anticipated interventions by cardiothoracic surgery, if any: surgical resection vs. SBRT were discussed; case is complicated by patient's significant co mordities  Past/Anticipated interventions by medical oncology, if any: NO  Signs/Symptoms  Weight changes, if any: denies decreased appetite or weight loss   Respiratory complaints, if any: occasional non productive cough, SOB with exertion  Hemoptysis, if any: no  Pain issues, if any:  no  SAFETY ISSUES:  Prior radiation? no  Pacemaker/ICD? no   Possible current pregnancy?no  Is the patient on methotrexate? no  Current Complaints / other details:  69 year old male. AX: levaquin and lisinopril.

## 2015-05-21 NOTE — Progress Notes (Deleted)
   69 year old gentleman with a history of heavy tobacco abuse who saw Dr. Melvyn Novas in March for a scheduled follow-up visit. At that time he was doing fairly well symptomatically. However a chest x-ray showed a new lung nodule in the right lung.      A CT of the chest confirmed a nodule in the superior segment of the right lower lobe.       A PET CT showed the nodule was hypermetabolic.       There also was an area of hypermetabolism below the manubrium anterior to the innominate vein which was read as adenopathy.

## 2015-05-22 ENCOUNTER — Ambulatory Visit
Admission: RE | Admit: 2015-05-22 | Discharge: 2015-05-22 | Disposition: A | Discharge status: Home / Self Care | Payer: Commercial Managed Care - HMO | Source: Ambulatory Visit | Attending: Radiation Oncology | Admitting: Radiation Oncology

## 2015-05-22 ENCOUNTER — Ambulatory Visit: Payer: Commercial Managed Care - HMO

## 2015-05-22 ENCOUNTER — Encounter: Payer: Self-pay | Admitting: Radiation Oncology

## 2015-05-22 VITALS — BP 138/57 | HR 58 | Temp 98.0°F | Resp 16 | Ht 66.0 in | Wt 217.6 lb

## 2015-05-22 DIAGNOSIS — Z87891 Personal history of nicotine dependence: Secondary | ICD-10-CM | POA: Diagnosis not present

## 2015-05-22 DIAGNOSIS — C3431 Malignant neoplasm of lower lobe, right bronchus or lung: Secondary | ICD-10-CM | POA: Diagnosis not present

## 2015-05-22 DIAGNOSIS — D381 Neoplasm of uncertain behavior of trachea, bronchus and lung: Secondary | ICD-10-CM

## 2015-05-22 DIAGNOSIS — R911 Solitary pulmonary nodule: Secondary | ICD-10-CM

## 2015-05-22 NOTE — Progress Notes (Signed)
See progress note for additional details.  

## 2015-05-22 NOTE — Progress Notes (Signed)
Radiation Oncology         [phone] ________________________________  Initial outpatient Consultation  Name: John [name] MRN: [id number]  Date: 05/22/2015  DOB: 08/30/1946  CC:No PCP Per Patient  John [name], *   REFERRING PHYSICIAN: Melrose [name], *  DIAGNOSIS: The encounter diagnosis was Solitary pulmonary nodule.    ICD-9-CM ICD-10-CM   1. Solitary pulmonary nodule 793.11 R91.1 Ambulatory referral to Interventional Radiology    HISTORY OF PRESENT ILLNESS::John [name] is a 69 year old gentleman with a history of heavy tobacco abuse who saw Dr. Melvyn [name] in March for a scheduled follow-up visit. At that time he was doing fairly well symptomatically. However, a chest x-ray showed a new lung nodule in the right lung.       A CT of the chest confirmed a nodule in the superior segment of the right lower lobe.       A PET CT showed the nodule was hypermetabolic.       There also was an area of hypermetabolism below the manubrium anterior to the innominate vein which was read as adenopathy.                Marland Kitchen  PREVIOUS RADIATION THERAPY: No  PAST MEDICAL HISTORY:  has a past medical history of CHF (congestive heart failure); MI (myocardial infarction); COPD (chronic obstructive pulmonary disease); Diabetes mellitus; Hypertension; Gout; Hyperlipemia; Lung cancer; and Pulmonary hypertension.    PAST SURGICAL HISTORY: Past Surgical History  Procedure Laterality Date  . Appendectomy      FAMILY HISTORY: family history includes Heart disease in his mother. There is no history of Cancer.  SOCIAL HISTORY:  reports that he quit smoking about 2 years ago. His smoking use included Cigarettes. He has a 90 pack-year smoking history. He has never used smokeless tobacco. He reports that he does not drink alcohol or use illicit drugs.  ALLERGIES: Levaquin and Lisinopril  MEDICATIONS:  Current Outpatient Prescriptions  Medication Sig Dispense  Refill  . albuterol (PROAIR HFA) 108 (90 BASE) MCG/ACT inhaler INHALE 2 PUFFS BY MOUTH EVERY 4 HOURS AS NEEDED FOR WHEEZING 1 Inhaler 11  . allopurinol (ZYLOPRIM) 100 MG tablet TAKE 1 TABLET BY MOUTH EVERY DAY 30 tablet 5  . amLODipine (NORVASC) 10 MG tablet TAKE 1 TABLET BY MOUTH EVERY DAY 30 tablet 5  . atorvastatin (LIPITOR) 20 MG tablet Take 1 tablet (20 mg total) by mouth daily. 90 tablet 3  . bisoprolol (ZEBETA) 5 MG tablet Take 5 mg by mouth daily.     . cloNIDine (CATAPRES) 0.1 MG tablet Take 0.1 mg by mouth 2 (two) times daily.    . colchicine 0.6 MG tablet TAKE 1 TABLET BY MOUTH EVERY DAY 30 tablet 4  . fenofibrate (TRICOR) 48 MG tablet TAKE 1 TABLET BY MOUTH EVERY DAY  NEEDS OFFICE VISIT (Patient taking differently: TAKE 1 TABLET BY MOUTH EVERY DAY  **NEEDS OFFICE VISIT) 30 tablet 5  . furosemide (LASIX) 20 MG tablet TAKE 1 TABLET BY MOUTH DAILY 30 tablet 5  . spironolactone (ALDACTONE) 25 MG tablet TAKE 1/2 TABLET BY MOUTH EVERY DAY   NEED OFFICE VISIT (Patient taking differently: TAKE 1/2 TABLET BY MOUTH EVERY DAY   *NEED OFFICE VISIT) 30 tablet 5  . SYMBICORT 160-4.5 MCG/ACT inhaler INHALE 2 PUFFS BY MOUTH EVERY 12 HOURS (FIRST THING IN MORNING THEN 12 HOURS LATER) 1 Inhaler 1  . polyethylene glycol powder (GLYCOLAX/MIRALAX) powder DISSOLVE 17 GRAMS (1 CAPFUL) INTO 8 OUNCES OF LIQUID  AND DRINK EVERY DAY (Patient not taking: Reported on 05/22/2015) 527 g 11   No current facility-administered medications for this encounter.    REVIEW OF SYSTEMS:  A 15 point review of systems is documented in the electronic medical record. This was obtained by the nursing staff. However, I reviewed this with the patient to discuss relevant findings and make appropriate changes. Pertinent items noted in HPI.    PHYSICAL EXAM:  height is '5\' 6"'$  (1.676 m) and weight is 217 lb 9.6 oz (98.703 kg). His oral temperature is 98 F (36.7 C). His blood pressure is 138/57 and his pulse is 58. His respiration is 16  and oxygen saturation is 98%.   The patient's head is normocephalic and atraumatic. Oropharynx is clear and free of thrush. Neck and supraclavicular regions are free of adenopathy. Lungs are clear to auscultation Heart is regular Extremities are free of cyanosis clubbing and edema The patient is neurologically grossly intact. Speech is fluent articulate Gait is normal The patient's mood and affect are appropriate  KPS = 100  100 - Normal; no complaints; no evidence of disease. 90   - Able to carry on normal activity; minor signs or symptoms of disease. 80   - Normal activity with effort; some signs or symptoms of disease. 59   - Cares for self; unable to carry on normal activity or to do active work. 60   - Requires occasional assistance, but is able to care for most of his personal needs. 50   - Requires considerable assistance and frequent medical care. 36   - Disabled; requires special care and assistance. 33   - Severely disabled; hospital admission is indicated although death not imminent. 51   - Very sick; hospital admission necessary; active supportive treatment necessary. 10   - Moribund; fatal processes progressing rapidly. 0     - Dead  Karnofsky DA, Abelmann [place], Craver LS and Burchenal [facility] [phone]) The use of the nitrogen mustards in the palliative treatment of carcinoma: with particular reference to bronchogenic carcinoma Cancer 1 634-56  LABORATORY DATA:  Lab Results  Component Value Date   WBC 13.4* 12/07/2012   HGB 11.1* 12/07/2012   HCT 34.0* 12/07/2012   MCV 96.9 12/07/2012   PLT 247 12/07/2012   Lab Results  Component Value Date   NA 140 03/15/2015   K 3.6 03/15/2015   CL 101 03/15/2015   CO2 30 03/15/2015   Lab Results  Component Value Date   ALT 10 12/06/2012   AST 18 12/06/2012   ALKPHOS 62 12/06/2012   BILITOT 0.6 12/06/2012     RADIOGRAPHY: No results found.    IMPRESSION: 69 year old man with a suspicious upper right lung module. The CT scan  and PET scan are consistent with cancer. Recommend biopsy to confirm and determine histology type of cancer for best treatment options. The presence of hypermetabolic mediastinal nodes are suspicious for stage IIIA disease  PLAN: Today, I talked to the patient and family about the findings and work-up thus far.  We discussed the natural history of early cancer and general treatment, highlighting the role of radiotherapy in the management.  We discussed the available radiation techniques, and focused on the details of logistics and delivery.  We reviewed the anticipated acute and late sequelae associated with radiation in this setting.  The patient was encouraged to ask questions that I answered to the best of my ability.  I filled out a patient counseling form during our  discussion including treatment diagrams.  We retained a copy for our records.    The patient would like to proceed with lung biopsy, then, follow-up to discuss results .  I spent 60 minutes minutes face to face with the patient and more than 50% of that time was spent in counseling and/or coordination of care.  This document serves as a record of services personally performed by Tyler Pita, MD. It was created on his behalf by Lenn Cal, a trained medical scribe. The creation of this record is based on the scribe's personal observations and the provider's statements to them. This document has been checked and approved by the attending provider.     ------------------------------------------------  Sheral Apley Tammi Klippel, M.D.

## 2015-05-23 ENCOUNTER — Other Ambulatory Visit: Payer: Self-pay | Admitting: Radiation Oncology

## 2015-05-23 ENCOUNTER — Other Ambulatory Visit: Payer: Commercial Managed Care - HMO | Admitting: Radiation Oncology

## 2015-05-23 DIAGNOSIS — R911 Solitary pulmonary nodule: Secondary | ICD-10-CM

## 2015-05-24 ENCOUNTER — Ambulatory Visit: Payer: Commercial Managed Care - HMO

## 2015-05-24 ENCOUNTER — Ambulatory Visit: Payer: Commercial Managed Care - HMO | Admitting: Radiation Oncology

## 2015-05-26 ENCOUNTER — Other Ambulatory Visit: Payer: Self-pay | Admitting: Internal Medicine

## 2015-05-27 DIAGNOSIS — J449 Chronic obstructive pulmonary disease, unspecified: Secondary | ICD-10-CM | POA: Diagnosis not present

## 2015-06-02 ENCOUNTER — Other Ambulatory Visit: Payer: Self-pay | Admitting: Radiology

## 2015-06-02 ENCOUNTER — Other Ambulatory Visit: Payer: Self-pay | Admitting: Nurse Practitioner

## 2015-06-05 ENCOUNTER — Ambulatory Visit (HOSPITAL_COMMUNITY)
Admission: RE | Admit: 2015-06-05 | Discharge: 2015-06-05 | Disposition: A | Discharge status: Home / Self Care | Payer: Commercial Managed Care - HMO | Source: Ambulatory Visit | Attending: Interventional Radiology | Admitting: Interventional Radiology

## 2015-06-05 ENCOUNTER — Ambulatory Visit (HOSPITAL_COMMUNITY)
Admission: RE | Admit: 2015-06-05 | Discharge: 2015-06-05 | Disposition: A | Discharge status: Home / Self Care | Payer: Commercial Managed Care - HMO | Source: Ambulatory Visit | Attending: Radiation Oncology | Admitting: Radiation Oncology

## 2015-06-05 ENCOUNTER — Encounter (HOSPITAL_COMMUNITY): Payer: Self-pay

## 2015-06-05 DIAGNOSIS — C3431 Malignant neoplasm of lower lobe, right bronchus or lung: Secondary | ICD-10-CM | POA: Insufficient documentation

## 2015-06-05 DIAGNOSIS — Z9889 Other specified postprocedural states: Secondary | ICD-10-CM | POA: Diagnosis not present

## 2015-06-05 DIAGNOSIS — R911 Solitary pulmonary nodule: Secondary | ICD-10-CM | POA: Diagnosis not present

## 2015-06-05 DIAGNOSIS — I517 Cardiomegaly: Secondary | ICD-10-CM | POA: Diagnosis not present

## 2015-06-05 LAB — APTT: aPTT: 32 seconds (ref 24–37)

## 2015-06-05 LAB — PROTIME-INR
INR: 1.09 (ref 0.00–1.49)
PROTHROMBIN TIME: 14.3 s (ref 11.6–15.2)

## 2015-06-05 LAB — CBC
HEMATOCRIT: 42.5 % (ref 39.0–52.0)
HEMOGLOBIN: 13.3 g/dL (ref 13.0–17.0)
MCH: 31.1 pg (ref 26.0–34.0)
MCHC: 31.3 g/dL (ref 30.0–36.0)
MCV: 99.5 fL (ref 78.0–100.0)
Platelets: 242 10*3/uL (ref 150–400)
RBC: 4.27 MIL/uL (ref 4.22–5.81)
RDW: 13.1 % (ref 11.5–15.5)
WBC: 10 10*3/uL (ref 4.0–10.5)

## 2015-06-05 MED ORDER — MIDAZOLAM HCL 2 MG/2ML IJ SOLN
INTRAMUSCULAR | Status: AC | PRN
  Administered 2015-06-05: 0.5 mg via INTRAVENOUS

## 2015-06-05 MED ORDER — FENTANYL CITRATE (PF) 100 MCG/2ML IJ SOLN
INTRAMUSCULAR | Status: AC
  Filled 2015-06-05: qty 2

## 2015-06-05 MED ORDER — SODIUM CHLORIDE 0.9 % IV SOLN
Freq: Once | INTRAVENOUS | Status: AC
  Administered 2015-06-05: 10:00:00 via INTRAVENOUS

## 2015-06-05 MED ORDER — MIDAZOLAM HCL 2 MG/2ML IJ SOLN
INTRAMUSCULAR | Status: AC
  Filled 2015-06-05: qty 4

## 2015-06-05 NOTE — Discharge Instructions (Signed)
Needle Biopsy of Lung, Care After Refer to this sheet in the next few weeks. These instructions provide you with information on caring for yourself after your procedure. Your health care provider may also give you more specific instructions. Your treatment has been planned according to current medical practices, but problems sometimes occur. Call your health care provider if you have any problems or questions after your procedure. WHAT TO EXPECT AFTER THE PROCEDURE  A bandage will be applied over the area where the needle was inserted. You may be asked to apply pressure to the bandage for several minutes to ensure there is minimal bleeding.  In most cases, you can leave when your needle biopsy procedure is completed. Do not drive yourself home. Someone else should take you home.  If you received an IV sedative or general anesthetic, you will be taken to a comfortable place to relax while the medicine wears off.  If you have upcoming travel scheduled, talk to your health care provider about when it is safe to travel by air after the procedure. HOME CARE INSTRUCTIONS  Expect to take it easy for the rest of the day.  Protect the area where you received the needle biopsy by keeping the bandage in place for as long as instructed.  You may feel some mild pain or discomfort in the area, but this should stop in a day or two.  Take medicines only as directed by your health care provider. SEEK MEDICAL CARE IF:   You have pain at the biopsy site that worsens or is not helped by medicine.  You have swelling or drainage at the needle biopsy site.  You have a fever. SEEK IMMEDIATE MEDICAL CARE IF:   You have new or worsening shortness of breath.  You have chest pain.  You are coughing up blood.  You have bleeding that does not stop with pressure or a bandage.  You develop light-headedness or fainting. Document Released: 10/13/2007 Document Revised: 05/02/2014 Document Reviewed:  05/10/2013 [facility] Patient Information 2015 [place], [place]. This information is not intended to replace advice given to you by your health care provider. Make sure you discuss any questions you have with your health care provider. Lung Biopsy A lung biopsy is a procedure in which a tissue sample is removed from the lung. The tissue can be examined under a microscope to help diagnose various lung disorders.  LET [facility] CARE PROVIDER KNOW ABOUT:  Any allergies you have.  All medicines you are taking, including vitamins, herbs, eye drops, creams, and over-the-counter medicines.  Previous problems you or members of your family have had with the use of anesthetics.  Any blood disorders or bleeding problems that you have.  Previous surgeries you have had.  Medical conditions you have. RISKS AND COMPLICATIONS Generally, a lung biopsy is a safe procedure. However, problems can occur and include:  Collapse of the lung.   Bleeding.   Infection.  BEFORE THE PROCEDURE  Do not eat or drink anything after midnight on the night before the procedure or as directed by your health care provider.  Ask your health care provider about changing or stopping your regular medicines. This is especially important if you are taking diabetes medicines or blood thinners.  Plan to have someone take you home after the procedure. PROCEDURE Various methods can be used to perform a lung biopsy:   Needle biopsy. A biopsy needle is inserted into the lung. The needle is used to collect the tissue sample. A CT  scanner may be used to guide the needle to the right place in the lung. For this method, a medicine is used to numb the area where the biopsy sample will be taken (local anesthetic). °· Bronchoscopy. A flexible tube (bronchoscope) is inserted into your lungs by going through your mouth or nose. A needle or forceps is passed through the bronchoscope to remove the tissue sample. For this method, medicine  may be used to numb the back of your throat. °· Open biopsy. A cut (incision) is made in your chest. The tissue sample is then removed using surgical tools. The incision is closed with skin glue, skin adhesive strips, or stitches. For this method, you will be given medicine to make you sleep through the procedure (general anesthetic). °AFTER THE PROCEDURE °· Your recovery will be assessed and monitored. °· You might have soreness and tenderness at the site of the biopsy for a few days after the procedure. °· You might have a cough and some soreness in your throat for a few days if a bronchoscope was used. °Document Released: 03/06/2005 Document Revised: 05/02/2014 Document Reviewed: 05/30/2013 °[employer]® Patient Information ©2015 [employer]. This information is not intended to replace advice given to you by your health care provider. Make sure you discuss any questions you have with your health care provider. °Conscious Sedation °Sedation is the use of medicines to promote relaxation and relieve discomfort and anxiety. Conscious sedation is a type of sedation. Under conscious sedation you are less alert than normal but are still able to respond to instructions or stimulation. Conscious sedation is used during short medical and dental procedures. It is milder than deep sedation or general anesthesia and allows you to return to your regular activities sooner.  °LET YOUR HEALTH CARE PROVIDER KNOW ABOUT:  °· Any allergies you have. °· All medicines you are taking, including vitamins, herbs, eye drops, creams, and over-the-counter medicines. °· Use of steroids (by mouth or creams). °· Previous problems you or members of your family have had with the use of anesthetics. °· Any blood disorders you have. °· Previous surgeries you have had. °· Medical conditions you have. °· Possibility of pregnancy, if this applies. °· Use of cigarettes, alcohol, or illegal drugs. °RISKS AND COMPLICATIONS °Generally, this is a safe  procedure. However, as with any procedure, problems can occur. Possible problems include: °· Oversedation. °· Trouble breathing on your own. You may need to have a breathing tube until you are awake and breathing on your own. °· Allergic reaction to any of the medicines used for the procedure. °BEFORE THE PROCEDURE °· You may have blood tests done. These tests can help show how well your kidneys and liver are working. They can also show how well your blood clots. °· A physical exam will be done.   °· Only take medicines as directed by your health care provider. You may need to stop taking medicines (such as blood thinners, aspirin, or nonsteroidal anti-inflammatory drugs) before the procedure.   °· Do not eat or drink at least 6 hours before the procedure or as directed by your health care provider. °· Arrange for a responsible adult, family member, or friend to take you home after the procedure. He or she should stay with you for at least 24 hours after the procedure, until the medicine has worn off. °PROCEDURE  °· An intravenous (IV) catheter will be inserted into one of your veins. Medicine will be able to flow directly into your body through this catheter. You may   be given medicine through this tube to help prevent pain and help you relax.  The medical or dental procedure will be done. AFTER THE PROCEDURE  You will stay in a recovery area until the medicine has worn off. Your blood pressure and pulse will be checked.   Depending on the procedure you had, you may be allowed to go home when you can tolerate liquids and your pain is under control. Document Released: 09/10/2001 Document Revised: 12/21/2013 Document Reviewed: 08/23/2013 [facility] Patient Information 2015 [place], [place]. This information is not intended to replace advice given to you by your health care provider. Make sure you discuss any questions you have with your health care provider.  Remove dressing and shower or bathe 24 to 48  hours post procedure.  Keep site clean and dry.  Conscious Sedation, Adult, Care After Refer to this sheet in the next few weeks. These instructions provide you with information on caring for yourself after your procedure. Your health care provider may also give you more specific instructions. Your treatment has been planned according to current medical practices, but problems sometimes occur. Call your health care provider if you have any problems or questions after your procedure. WHAT TO EXPECT AFTER THE PROCEDURE  After your procedure:  You may feel sleepy, clumsy, and have poor balance for several hours.  Vomiting may occur if you eat too soon after the procedure. HOME CARE INSTRUCTIONS  Do not participate in any activities where you could become injured for at least 24 hours. Do not:  Drive.  Swim.  Ride a bicycle.  Operate heavy machinery.  Cook.  Use power tools.  Climb ladders.  Work from a high place.  Do not make important decisions or sign legal documents until you are improved.  If you vomit, drink water, juice, or soup when you can drink without vomiting. Make sure you have little or no nausea before eating solid foods.  Only take over-the-counter or prescription medicines for pain, discomfort, or fever as directed by your health care provider.  Make sure you and your family fully understand everything about the medicines given to you, including what side effects may occur.  You should not drink alcohol, take sleeping pills, or take medicines that cause drowsiness for at least 24 hours.  If you smoke, do not smoke without supervision.  If you are feeling better, you may resume normal activities 24 hours after you were sedated.  Keep all appointments with your health care provider. SEEK MEDICAL CARE IF:  Your skin is pale or bluish in color.  You continue to feel nauseous or vomit.  Your pain is getting worse and is not helped by medicine.  You have  bleeding or swelling.  You are still sleepy or feeling clumsy after 24 hours. SEEK IMMEDIATE MEDICAL CARE IF:  You develop a rash.  You have difficulty breathing.  You develop any type of allergic problem.  You have a fever. MAKE SURE YOU:  Understand these instructions.  Will watch your condition.  Will get help right away if you are not doing well or get worse. Document Released: 10/06/2013 Document Reviewed: 10/06/2013 [facility] Patient Information 2015 [place], [place]. This information is not intended to replace advice given to you by your health care provider. Make sure you discuss any questions you have with your health care provider.

## 2015-06-05 NOTE — H&P (Signed)
Chief Complaint: "I'm having a lung biopsy"  Referring Physician(s): Manning,Matthew  History of Present Illness: John [name] is a 69 y.o. male , former smoker, with history of CHF, COPD, diabetes, HTN, coronary artery disease, and new right midlung nodule noted on recent chest x-ray for routine physical exam. Follow-up CT chest revealed spiculated superior segment right lower lobe pulmonary nodule suspicious for primary bronchogenic carcinoma. Subsequent PET scan revealed a 2.1 cm hypermetabolic nodule in the superior segment of the right lower lobe, hypermetabolic anterior mediastinal lymph node, and focus of hypermetabolic activity in the central prostate peripheral zone. He presents today for CT-guided biopsy of the right lower lobe lung nodule.  Past Medical History  Diagnosis Date  . CHF (congestive heart failure)   . MI (myocardial infarction)   . COPD (chronic obstructive pulmonary disease)   . Diabetes mellitus   . Hypertension   . Gout   . Hyperlipemia   . Lung cancer     highly suspicious for primary bronchogenic carcinoma  . Pulmonary hypertension     Past Surgical History  Procedure Laterality Date  . Appendectomy      Allergies: Levaquin and Lisinopril  Medications: Prior to Admission medications   Medication Sig Start Date End Date Taking? Authorizing Provider  albuterol (PROAIR HFA) 108 (90 BASE) MCG/ACT inhaler INHALE 2 PUFFS BY MOUTH EVERY 4 HOURS AS NEEDED FOR WHEEZING 03/13/15  Yes Tanda Rockers, MD  allopurinol (ZYLOPRIM) 100 MG tablet TAKE 1 TABLET BY MOUTH EVERY DAY 05/01/15  Yes Tanda Rockers, MD  amLODipine (NORVASC) 10 MG tablet TAKE 1 TABLET BY MOUTH EVERY DAY 05/01/15  Yes Tanda Rockers, MD  atorvastatin (LIPITOR) 20 MG tablet Take 1 tablet (20 mg total) by mouth daily. 11/03/14  Yes Larey Dresser, MD  bisoprolol (ZEBETA) 5 MG tablet Take 5 mg by mouth daily.  04/24/15  Yes Historical Provider, MD  cloNIDine (CATAPRES) 0.1 MG tablet Take 0.1  mg by mouth 2 (two) times daily.   Yes Historical Provider, MD  colchicine 0.6 MG tablet TAKE 1 TABLET BY MOUTH EVERY DAY 05/01/15  Yes Tanda Rockers, MD  fenofibrate (TRICOR) 48 MG tablet TAKE 1 TABLET BY MOUTH EVERY DAY  NEEDS OFFICE VISIT Patient taking differently: TAKE 1 TABLET BY MOUTH EVERY DAY  **NEEDS OFFICE VISIT 05/01/15  Yes Tanda Rockers, MD  furosemide (LASIX) 20 MG tablet TAKE 1 TABLET BY MOUTH DAILY 05/01/15  Yes Tanda Rockers, MD  polyethylene glycol powder (GLYCOLAX/MIRALAX) powder DISSOLVE 17 GRAMS (1 CAPFUL) INTO 8 OUNCES OF LIQUID AND DRINK EVERY DAY Patient taking differently: DISSOLVE 17 GRAMS (1 CAPFUL) INTO 8 OUNCES OF LIQUID AND DRINK EVERY DAY as needed for constipation 02/03/15  Yes Tanda Rockers, MD  spironolactone (ALDACTONE) 25 MG tablet TAKE 1/2 TABLET BY MOUTH EVERY DAY   NEED OFFICE VISIT Patient taking differently: TAKE 1/2 TABLET BY MOUTH EVERY DAY   *NEED OFFICE VISIT 05/01/15  Yes Tanda Rockers, MD  SYMBICORT 160-4.5 MCG/ACT inhaler INHALE 2 PUFFS BY MOUTH EVERY 12 HOURS (FIRST THING IN MORNING THEN 12 HOURS LATER) 05/30/15  Yes Tanda Rockers, MD  VENTOLIN HFA 108 (90 BASE) MCG/ACT inhaler INHALE 2 PUFFS BY MOUTH EVERY 4 HOURS AS NEEDED FOR WHEEZING 05/30/15  Yes Tanda Rockers, MD    Family History  Problem Relation Age of Onset  . Heart disease Mother   . Cancer Neg Hx     History   Social  History  . Marital Status: Legally Separated    Spouse Name: N/A  . Number of Children: 2  . Years of Education: N/A   Social History Main Topics  . Smoking status: Former Smoker -- 1.50 packs/day for 60 years    Types: Cigarettes    Quit date: 09/29/2012  . Smokeless tobacco: Never Used  . Alcohol Use: No  . Drug Use: No  . Sexual Activity: Yes   Other Topics Concern  . None   Social History Narrative      Review of Systems   Constitutional: Negative for fever and chills.  Respiratory: Negative for cough.        Some occ dyspnea with exertion    Cardiovascular: Negative for chest pain.  Gastrointestinal: Negative for nausea, vomiting, abdominal pain and blood in stool.  Genitourinary: Negative for dysuria and hematuria.  Musculoskeletal: Negative for back pain.  Neurological: Negative for headaches.    Vital Signs: BP 155/73 mmHg  Pulse 63  Temp(Src) 98.4 F (36.9 C) (Oral)  Resp 18  SpO2 97%  Physical Exam  Constitutional: He appears well-developed and well-nourished.  Cardiovascular: Normal rate and regular rhythm.   Pulmonary/Chest: Effort normal.  Distant breath sounds bilaterally  Abdominal: Soft. Bowel sounds are normal.  protuberant  Musculoskeletal: Normal range of motion. He exhibits no edema.  Neurological: He is alert.    Imaging: No results found.  Labs:  CBC:  Recent Labs  06/05/15 1018  WBC 10.0  HGB 13.3  HCT 42.5  PLT 242    COAGS:  Recent Labs  06/05/15 1018  INR 1.09  APTT 32    BMP:  Recent Labs  11/03/14 1202 03/15/15 0921  NA 138 140  K 4.5 3.6  CL 98 101  CO2 25 30  GLUCOSE 116* 129*  BUN 22 14  CALCIUM 9.8 9.6  CREATININE 1.36* 1.23  GFRNONAA 52*  --   GFRAA 60*  --     LIVER FUNCTION TESTS: No results for input(s): BILITOT, AST, ALT, ALKPHOS, PROT, ALBUMIN in the last 8760 hours.  TUMOR MARKERS: No results for input(s): AFPTM, CEA, CA199, CHROMGRNA in the last 8760 hours.  Assessment and Plan: John [name] is a 69 y.o. male , former smoker, with history of CHF, COPD, diabetes, HTN, coronary artery disease, and new right midlung nodule noted on recent chest x-ray for routine physical exam. He has no history of cancer. Follow-up CT chest revealed spiculated superior segment right lower lobe pulmonary nodule suspicious for primary bronchogenic carcinoma. Subsequent PET scan revealed a 2.1 cm hypermetabolic nodule in the superior segment of the right lower lobe, hypermetabolic anterior mediastinal lymph node, and focus of hypermetabolic activity in the  central prostate peripheral zone. He presents today for CT-guided biopsy of the right lower lobe lung nodule.Risks and benefits discussed with the patient including, but not limited to bleeding, infection, damage to adjacent structures or low yield requiring additional tests.All of the patient's questions were answered, patient is agreeable to proceed. Consent signed and in chart.      Signed: D. Rowe Robert 06/05/2015, 11:03 AM   I spent a total of 20 minutes face to face in clinical consultation, greater than 50% of which was counseling/coordinating care for CT-guided right lung mass biopsy

## 2015-06-05 NOTE — Procedures (Signed)
Technically successful CT guided biopsy of right lower lobe pulmonary nodule No immediate post procedural complications.

## 2015-06-12 ENCOUNTER — Telehealth: Payer: Self-pay | Admitting: Internal Medicine

## 2015-06-12 NOTE — Telephone Encounter (Signed)
new patient referral-left message for Willette Cluster to return call

## 2015-06-14 ENCOUNTER — Encounter: Payer: Self-pay | Admitting: Adult Health

## 2015-06-14 ENCOUNTER — Ambulatory Visit (INDEPENDENT_AMBULATORY_CARE_PROVIDER_SITE_OTHER): Payer: Commercial Managed Care - HMO | Admitting: Adult Health

## 2015-06-14 VITALS — BP 132/70 | HR 57 | Temp 97.6°F | Ht 67.0 in | Wt 219.4 lb

## 2015-06-14 DIAGNOSIS — J9611 Chronic respiratory failure with hypoxia: Secondary | ICD-10-CM

## 2015-06-14 DIAGNOSIS — J449 Chronic obstructive pulmonary disease, unspecified: Secondary | ICD-10-CM

## 2015-06-14 DIAGNOSIS — C3431 Malignant neoplasm of lower lobe, right bronchus or lung: Secondary | ICD-10-CM | POA: Insufficient documentation

## 2015-06-14 NOTE — Progress Notes (Signed)
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 Not needing any daytime saba but generally confused on how to use meds.  >change to Advair 250/50     03/01/2013 f/u ov/Wert cc confused with meds/ med calendar using advair 500 and confused with how to use hfa albuterol but overall doing better rec Advair 250/50 one twice daily = Plan A Only use your albuterol (ventolin = Plan B)   Please see patient coordinator before you leave today  to schedule portable 02      09/15/2013 f/u ov/Wert re: copd/02 dep Chief Complaint  Patient presents with  . Follow-up    Pt states his breathing is unchanged since the last visit. No new co's today. Usinf albuterol HFA approx 3 times per wk.  not really limited from desired activities rec Stop carvedilol (coreg) and substitute with bisoprolol 5 mg  One daily  Continue symbicort 160 Take 2 puffs first thing in am and then another 2 puffs about 12 hours later Remember don't leave home without your rescue inhaler (ventolin) Rec:  02 2lpm sleeping and walking outside the house     03/13/2015 f/u ov/Wert re: copd GOLD III/ confused with meds / no med calendar  Chief Complaint  Patient presents with  . Follow-up    Pt states that his breathing is unchanged since his last visit.   avg use rescue only once or twice daily esp a problem with bending  over or if gets in a big hurry but ok with slow paced flat walking/ adls  Using 02 just at hs , not with ambulation  Has saba and respiclick and not sure which one to use but likes hfa better  rec symbicort 160 Take 2 puffs first thing in am and then another 2 puffs about 12 hours later.  Only use your albuterol (proair) as a rescue medication  Please remember to go to the  x-ray department  > Pos SPN > PET pos.     04/20/2015 f/u ov/Wert re: copd GOLD II/ spn RLL with ? Pos node ant med  Not using symbicort 160 consistently  / no sob slow and flat despite missing meds  >>refer to [facility]  06/14/2015 Follow up : COPD GOLD II /+Lung Cancer -Squamous cell  Pt returns for 2 month follow up .  Pt with with RLL nodule 2.1cm positive on PET scan .  Underwent CT guided bx on 6/8 that was positive squamous cell carcinoma.  Has seen [facility] for surgical consult . Pt with multiple chronic medical problems including  CAD and pulmonary HTN . Last FEV1 46%. Will need new full PFT to proceed for consideration  Of wedge resection .also would need cardiology consult with Right and left heart cath   He has also been sent to Radiation Oncology this week and  Oncology  Next with DR. Mohamed.  We discussed his results and recent visits with surgery .  Pt says breathing is at baseline No flare of cough or wheezing .  No weight loss , hemoptyiss or chest pain.      Current Medications, Allergies, Complete Past Medical History, Past Surgical History, Family History, and Social History were reviewed in [employer] record.  ROS  The following are not active complaints unless bolded sore throat, dysphagia, dental problems, itching, sneezing,  nasal congestion or excess/ purulent secretions, ear ache,   fever, chills, sweats, unintended wt loss, pleuritic or exertional cp, hemoptysis,  orthopnea pnd or leg swelling, presyncope, palpitations, heartburn, abdominal pain, anorexia, nausea,  vomiting, diarrhea  or change in bowel or urinary habits, change in stools or urine, dysuria,hematuria,  rash, arthralgias, visual complaints, headache, numbness weakness or ataxia or problems with walking or coordination,  change in mood/affect or memory.                  Objective:   Physical Exam  03/19/2013  Wt 208 vs 06/17/2013 209  09/15/2013  206 > 212 01/10/2014 > 07/11/2014  210  > 10/10/2014 209 >210 01/11/2015 > 03/13/2015 212 > 04/20/2015  213>219 06/14/2015    amb bm nad  HEENT mild turbinate edema.  Edentulous,  Oropharynx no thrush or excess pnd or cobblestoning. Edentulous with dentures in place No JVD or cervical adenopathy. Mild accessory muscle hypertrophy. Trachea midline, nl thryroid. Chest was hyperinflated by percussion with diminished breath sounds and moderate increased exp time without wheeze.  Regular rate and rhythm without murmur gallop or rub or increase P2 or edema.  Abd: quite protuberant/ no hsm, nl excursion. Ext warm without cyanosis or clubbing.       CXR PA and Lateral:   03/13/2015 :     I personally reviewed images and agree with radiology impression as follows:    New right mid lung nodule. Further evaluation by means of CT of the chest is recommended.  PET  04/13/15  2.1 cm hypermetabolic nodule in the superior segment of the right lower lobe, consistent with primary bronchogenic carcinoma.  Single 10 mm prevascular anterior mediastinal lymph node is hypermetabolic; no other hypermetabolic nodes are identified in the thorax. Differential diagnosis includes reactive and metastatic lymphadenopathy.     Assessment & Plan:

## 2015-06-14 NOTE — Progress Notes (Signed)
Chart and office note reviewed in detail along with available xrays/ labs/ path reports / consult notes > agree with a/p as outlined

## 2015-06-14 NOTE — Assessment & Plan Note (Signed)
RLL lung nodule >pet positive > CT guided Bx with Squamous Cell carcinoma ,  Seen by [facility] -not candidate for lobectomy , consider for wedge resection-High risk with pulmonary HTN/CAD /Severe COPD > would need Right /Left heart Cath , PFT Has upcoming evaluation with Radiation Oncology and  Oncology Dr. Julien Nordmann.  Reviewed case with Dr. Melvyn Novas  .  Pt will follow up in 4 weeks with PFT.

## 2015-06-14 NOTE — Assessment & Plan Note (Signed)
Compensated on regimen without flare  Return for PFT in 4 weeks with Dr. Melvyn Novas

## 2015-06-14 NOTE — Assessment & Plan Note (Signed)
Cont on nocturnal O2 .

## 2015-06-14 NOTE — Patient Instructions (Signed)
Continue on Symbicort 2 puffs Twice daily   Biopsy results were positive for Lung cancer Squamous Cell Carcinoma .  You have an appointment with Dr. Julien Nordmann next week on 06/21/15 .  follow up Dr. Melvyn Novas  In 4 weeks with PFT  Please contact office for sooner follow up if symptoms do not improve or worsen or seek emergency care

## 2015-06-15 ENCOUNTER — Encounter: Payer: Self-pay | Admitting: Radiation Oncology

## 2015-06-15 NOTE — Progress Notes (Signed)
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 Melvyn Novas in March for a scheduled follow up visit. At that time he was doing fairly well symptomatically. However, a chest xray showed a new lung nodule in the right lung  Patient to meet with radiation oncology prior to biopsy "to make sure that is how he would like to proceed before subjecting him to the procedure." Dr. Tammi Klippel consulted the patient on 05/22/2015 then, sent him for a biopsy.  Tobacco/Marijuana/Snuff/ETOH use: Former smoker; quit in 2013  Past/Anticipated interventions by cardiothoracic surgery, if any: surgical resection vs. radiation were discussed; case is complicated by patient's significant co mordities  Past/Anticipated interventions by medical oncology, if any: NO  Signs/Symptoms  Weight changes, if any: denies decreased appetite or weight loss   Respiratory complaints, if any: occasional non productive cough, SOB with exertion  Hemoptysis, if any: no  Pain issues, if any: no  SAFETY ISSUES:  Prior radiation? no  Pacemaker/ICD? no  Possible current pregnancy?no  Is the patient on methotrexate? no  Current Complaints / other details: 69 year old male. AX: levaquin and lisinopril.   Patient returns today to review biopsy results.

## 2015-06-16 ENCOUNTER — Ambulatory Visit
Admission: RE | Admit: 2015-06-16 | Discharge: 2015-06-16 | Disposition: A | Discharge status: Home / Self Care | Payer: Commercial Managed Care - HMO | Source: Ambulatory Visit | Attending: Radiation Oncology | Admitting: Radiation Oncology

## 2015-06-16 ENCOUNTER — Encounter: Payer: Self-pay | Admitting: Radiation Oncology

## 2015-06-16 VITALS — BP 158/67 | HR 62 | Temp 97.7°F | Resp 20 | Ht 67.0 in | Wt 219.8 lb

## 2015-06-16 DIAGNOSIS — Z87891 Personal history of nicotine dependence: Secondary | ICD-10-CM | POA: Diagnosis not present

## 2015-06-16 DIAGNOSIS — C3431 Malignant neoplasm of lower lobe, right bronchus or lung: Secondary | ICD-10-CM | POA: Diagnosis not present

## 2015-06-16 DIAGNOSIS — C3432 Malignant neoplasm of lower lobe, left bronchus or lung: Secondary | ICD-10-CM | POA: Diagnosis not present

## 2015-06-16 NOTE — Progress Notes (Signed)
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 Primary cancer of right lower lobe of lung 162.5 C34.31     Narrative:  The patient returns today for a follow-up after lung biospy. The pt underwent CT guided lung biopsy on 06/05/15. This showed squamous cell carcinoma. He returned today to review the results and coordinate further care. He denies coughing, hemoptysis and shortness of breath. He is here with his daughter.       ALLERGIES:  is allergic to levaquin and lisinopril.  Meds: Current Outpatient Prescriptions  Medication Sig Dispense Refill  . albuterol (PROAIR HFA) 108 (90 BASE) MCG/ACT inhaler INHALE 2 PUFFS BY MOUTH EVERY 4 HOURS AS NEEDED FOR WHEEZING 1 Inhaler 11  . allopurinol (ZYLOPRIM) 100 MG tablet TAKE 1 TABLET BY MOUTH EVERY DAY 30 tablet 5  . amLODipine (NORVASC) 10 MG tablet TAKE 1 TABLET BY MOUTH EVERY DAY 30 tablet 5  . atorvastatin (LIPITOR) 20 MG tablet Take 1 tablet (20 mg total) by mouth daily. 90 tablet 3  . bisoprolol (ZEBETA) 5 MG tablet Take 5 mg by mouth daily.     . cloNIDine (CATAPRES) 0.1 MG tablet Take 0.1 mg by mouth 2 (two) times daily.    . colchicine 0.6 MG tablet TAKE 1 TABLET BY MOUTH EVERY DAY 30 tablet 4  . fenofibrate (TRICOR) 48 MG tablet TAKE 1 TABLET BY MOUTH EVERY DAY  NEEDS OFFICE VISIT (Patient taking differently: TAKE 1 TABLET BY MOUTH EVERY DAY  **NEEDS OFFICE VISIT) 30 tablet 5  . furosemide (LASIX) 20 MG tablet TAKE 1 TABLET BY MOUTH DAILY 30 tablet 5  . polyethylene glycol powder (GLYCOLAX/MIRALAX) powder DISSOLVE 17 GRAMS (1 CAPFUL) INTO 8 OUNCES OF LIQUID AND DRINK EVERY DAY (Patient taking differently: DISSOLVE 17 GRAMS (1 CAPFUL) INTO 8  OUNCES OF LIQUID AND DRINK EVERY DAY as needed for constipation) 527 g 11  . spironolactone (ALDACTONE) 25 MG tablet TAKE 1/2 TABLET BY MOUTH EVERY DAY   NEED OFFICE VISIT (Patient taking differently: TAKE 1/2 TABLET BY MOUTH EVERY DAY   *NEED OFFICE VISIT) 30 tablet 5  . SYMBICORT 160-4.5 MCG/ACT inhaler INHALE 2 PUFFS BY MOUTH EVERY 12 HOURS (FIRST THING IN MORNING THEN 12 HOURS LATER) 10.2 g 0  . VENTOLIN HFA 108 (90 BASE) MCG/ACT inhaler INHALE 2 PUFFS BY MOUTH EVERY 4 HOURS AS NEEDED FOR WHEEZING 18 g 0   No current facility-administered medications for this encounter.    Physical Findings: The patient is in no acute distress. Patient is alert and oriented.  height is '5\' 7"'$  (1.702 m) and weight is 219 lb 12.8 oz (99.701 kg). His oral temperature is 97.7 F (36.5 C). His blood pressure is 158/67 and his pulse is 62. His respiration is 20 and oxygen saturation is 98%. Marland Kitchen Respiratory effort unlabored. No significant changes.  Lab Findings: Lab Results  Component Value Date   WBC 10.0 06/05/2015   HGB 13.3 06/05/2015   HCT 42.5 06/05/2015   PLT 242 06/05/2015    Lab Results  Component Value Date   NA 140 03/15/2015   K 3.6 03/15/2015   CO2 30 03/15/2015   GLUCOSE 129*  03/15/2015   BUN 14 03/15/2015   CREATININE 1.23 03/15/2015   BILITOT 0.6 12/06/2012   ALKPHOS 62 12/06/2012   AST 18 12/06/2012   ALT 10 12/06/2012   PROT 7.2 12/06/2012   ALBUMIN 3.0* 12/06/2012   CALCIUM 9.6 03/15/2015   ANIONGAP 15 11/03/2014    Radiographic Findings: Dg Chest 1 View  06/05/2015   CLINICAL DATA:  Status post lung biopsy  EXAM: CHEST  1 VIEW  COMPARISON:  03/13/2015  FINDINGS: Known right lung nodule status post percutaneous biopsy. No evidence of significant alveolar hemorrhage. No air leak.  Stable borderline cardiomegaly.  Negative aortic and hilar contours.  IMPRESSION: No visible air leak or alveolar hemorrhage after right lung biopsy.   Electronically Signed   By: Monte Fantasia M.D.    On: 06/05/2015 14:29   Ct Biopsy  06/05/2015   INDICATION: Solitary hypermetabolic nodule within in the superior segment of the right upper lobe worrisome for bronchogenic carcinoma. Please perform CT-guided biopsy for tissue diagnostic purposes  EXAM: CT-GUIDED BIOPSY OF HYPERMETABOLIC RIGHT UPPER LOBE PULMONARY NODULE  COMPARISON:  PET-CT -  04/13/2015; chest CT - 03/27/2015  MEDICATIONS: Fentanyl 25 mcg IV; Versed 0.5 mg IV  ANESTHESIA/SEDATION: Sedation time  16 minutes  CONTRAST:  None  COMPLICATIONS: None immediate.  PROCEDURE: Informed consent was obtained from the patient following an explanation of the procedure, risks, benefits and alternatives. The patient understands,agrees and consents for the procedure. All questions were addressed. A time out was performed prior to the initiation of the procedure.  The patient was positioned right lateral decubitus, slightly LAO on the CT table and a limited chest CT was performed for procedural planning demonstrating unchanged size and appearance of the dominant approximately 2.1 x 2.4 cm nodule within the superior segment of the right lower lobe (image 8, series 8). The operative site was prepped and draped in the usual sterile fashion. Under sterile conditions and local anesthesia, a 17 gauge coaxial needle was advanced into the peripheral aspect of the nodule. Positioning was confirmed with intermittent CT fluoroscopy and followed by the acquisition of 3 core needle biopsies with an 18 gauge core needle biopsy device.  Limited post procedural chest CT was negative for pneumothorax or additional complication. The co-axial needle was removed and hemostasis was achieved with manual compression. A dressing was placed. The patient tolerated the procedure well without immediate postprocedural complication. The patient was escorted to have an upright chest radiograph.  IMPRESSION: Technically successful CT guided core needle core biopsy of hypermetabolic nodule within  the superior segment of the right lower lobe.   Electronically Signed   By: Sandi Mariscal M.D.   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 He would benefit from radiotherapy to the chest and possibly chemotherapy.  Plan: Today, I talked to the patient and family about the findings and work-up thus far.  We discussed the natural history of Stage IIIA non-small cell lung cancer and general treatment, highlighting the role of radiotherapy in the management.  We discussed the available radiation techniques, and focused on the details of logistics and delivery.  We reviewed the anticipated acute and late sequelae associated with radiation in this setting.  The patient was encouraged to ask questions that I answered to the best of my ability.  I filled out a patient counseling form during our discussion including treatment diagrams.  We  retained a copy for our records.  The patient would like to proceed with radiation and will be scheduled for CT simulation. The patient is scheduled to see medical oncology on June 22.  I spent 30 minutes minutes face to face with the patient and more than 50% of that time was spent in counseling and/or coordination of care.  This document serves as a record of services personally performed by Tyler Pita, MD. It was created on his behalf by Darcus Austin, a trained medical scribe. The creation of this record is based on the scribe's personal observations and the provider's statements to them. This document has been checked and approved by the attending provider.     _____________________________________  Sheral Apley. Tammi Klippel, M.D.

## 2015-06-16 NOTE — Progress Notes (Signed)
  Radiation Oncology         [phone] ________________________________  Name: John [name] MRN: [id number]  Date: 06/16/2015  DOB: Jun 02, 1946  SIMULATION AND TREATMENT PLANNING NOTE    ICD-9-CM ICD-10-CM   1. 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  Identity was confirmed.  All relevant records and images related to the planned course of therapy were reviewed.  The patient freely provided informed written consent to proceed with treatment after reviewing the details related to the planned course of therapy. The consent form was witnessed and verified by the simulation staff.  Then, the patient was set-up in a stable reproducible  supine position for radiation therapy.  CT images were obtained.  Surface markings were placed.  The CT images were loaded into the planning software.  Then the target and avoidance structures were contoured.  Treatment planning then occurred.  The radiation prescription was entered and confirmed.  Then, I designed and supervised the construction of a total of 6 medically necessary complex treatment devices, including a BodyFix immobilization mold custom fitted to the patient along with 5 multileaf collimators conformally shaped radiation around the treatment target while shielding critical structures such as the heart and spinal cord maximally.  I have requested : 3D Simulation  I have requested a DVH of the following structures: Left lung, right lung, spinal cord, heart, esophagus, and target.  I have ordered:Nutrition Consult  SPECIAL TREATMENT PROCEDURE:  The planned course of therapy using radiation constitutes a special treatment procedure. Special care is required in the management of this patient for the following reasons.  The patient will be receiving concurrent  chemotherapy requiring careful monitoring for increased toxicities of treatment including periodic laboratory values.  The special nature of the planned course of radiotherapy will require increased physician supervision and oversight to ensure patient's safety with optimal treatment outcomes.  PLAN:  The patient will receive 66 Gy in 33 fractions.  This document serves as a record of services personally performed by Tyler Pita, MD. It was created on his behalf by Darcus Austin, a trained medical scribe. The creation of this record is based on the scribe's personal observations and the provider's statements to them. This document has been checked and approved by the attending provider.     ________________________________  Sheral Apley. Tammi Klippel, M.D.

## 2015-06-16 NOTE — Progress Notes (Signed)
John [name] here to discuss his biopsy results.  He denies coughing, hemoptysis and shortness of breath.  He is here with his daughter.  BP 158/67 mmHg  Pulse 62  Temp(Src) 97.7 F (36.5 C) (Oral)  Resp 20  Ht '5\' 7"'$  (1.702 m)  Wt 219 lb 12.8 oz (99.701 kg)  BMI 34.42 kg/m2  SpO2 98%

## 2015-06-16 NOTE — Progress Notes (Signed)
Please see the Nurse Progress Note in the MD Initial Consult Encounter for this patient. 

## 2015-06-19 ENCOUNTER — Encounter: Payer: Self-pay | Admitting: Radiation Oncology

## 2015-06-19 DIAGNOSIS — C3431 Malignant neoplasm of lower lobe, right bronchus or lung: Secondary | ICD-10-CM | POA: Diagnosis not present

## 2015-06-19 DIAGNOSIS — Z87891 Personal history of nicotine dependence: Secondary | ICD-10-CM | POA: Diagnosis not present

## 2015-06-20 DIAGNOSIS — C3431 Malignant neoplasm of lower lobe, right bronchus or lung: Secondary | ICD-10-CM | POA: Diagnosis not present

## 2015-06-20 DIAGNOSIS — Z87891 Personal history of nicotine dependence: Secondary | ICD-10-CM | POA: Diagnosis not present

## 2015-06-21 ENCOUNTER — Ambulatory Visit: Payer: Commercial Managed Care - HMO | Admitting: Internal Medicine

## 2015-06-21 ENCOUNTER — Ambulatory Visit: Payer: Commercial Managed Care - HMO

## 2015-06-21 ENCOUNTER — Other Ambulatory Visit: Payer: Self-pay | Admitting: Internal Medicine

## 2015-06-21 ENCOUNTER — Other Ambulatory Visit: Payer: Commercial Managed Care - HMO

## 2015-06-21 DIAGNOSIS — C3431 Malignant neoplasm of lower lobe, right bronchus or lung: Secondary | ICD-10-CM

## 2015-06-22 ENCOUNTER — Telehealth: Payer: Self-pay | Admitting: Hematology

## 2015-06-22 NOTE — Telephone Encounter (Signed)
LEFT MESSAGE FOR PATIENT TO RETURN CALL  °

## 2015-06-23 ENCOUNTER — Other Ambulatory Visit: Payer: Self-pay | Admitting: Internal Medicine

## 2015-06-23 ENCOUNTER — Ambulatory Visit
Admission: RE | Admit: 2015-06-23 | Discharge: 2015-06-23 | Disposition: A | Discharge status: Home / Self Care | Payer: Commercial Managed Care - HMO | Source: Ambulatory Visit | Attending: Radiation Oncology | Admitting: Radiation Oncology

## 2015-06-23 DIAGNOSIS — C3431 Malignant neoplasm of lower lobe, right bronchus or lung: Secondary | ICD-10-CM | POA: Diagnosis not present

## 2015-06-23 DIAGNOSIS — Z87891 Personal history of nicotine dependence: Secondary | ICD-10-CM | POA: Diagnosis not present

## 2015-06-26 ENCOUNTER — Ambulatory Visit
Admission: RE | Admit: 2015-06-26 | Discharge: 2015-06-26 | Disposition: A | Discharge status: Home / Self Care | Payer: Commercial Managed Care - HMO | Source: Ambulatory Visit | Attending: Radiation Oncology | Admitting: Radiation Oncology

## 2015-06-26 DIAGNOSIS — Z87891 Personal history of nicotine dependence: Secondary | ICD-10-CM | POA: Diagnosis not present

## 2015-06-26 DIAGNOSIS — C3431 Malignant neoplasm of lower lobe, right bronchus or lung: Secondary | ICD-10-CM | POA: Diagnosis not present

## 2015-06-27 ENCOUNTER — Ambulatory Visit
Admission: RE | Admit: 2015-06-27 | Discharge: 2015-06-27 | Disposition: A | Discharge status: Home / Self Care | Payer: Commercial Managed Care - HMO | Source: Ambulatory Visit | Attending: Radiation Oncology | Admitting: Radiation Oncology

## 2015-06-27 DIAGNOSIS — Z87891 Personal history of nicotine dependence: Secondary | ICD-10-CM | POA: Diagnosis not present

## 2015-06-27 DIAGNOSIS — J449 Chronic obstructive pulmonary disease, unspecified: Secondary | ICD-10-CM | POA: Diagnosis not present

## 2015-06-27 DIAGNOSIS — C3431 Malignant neoplasm of lower lobe, right bronchus or lung: Secondary | ICD-10-CM | POA: Diagnosis not present

## 2015-06-28 ENCOUNTER — Ambulatory Visit
Admission: RE | Admit: 2015-06-28 | Discharge: 2015-06-28 | Disposition: A | Discharge status: Home / Self Care | Payer: Commercial Managed Care - HMO | Source: Ambulatory Visit | Attending: Radiation Oncology | Admitting: Radiation Oncology

## 2015-06-28 DIAGNOSIS — C3431 Malignant neoplasm of lower lobe, right bronchus or lung: Secondary | ICD-10-CM | POA: Diagnosis not present

## 2015-06-28 DIAGNOSIS — Z87891 Personal history of nicotine dependence: Secondary | ICD-10-CM | POA: Diagnosis not present

## 2015-06-29 ENCOUNTER — Encounter: Payer: Self-pay | Admitting: Radiation Oncology

## 2015-06-29 ENCOUNTER — Ambulatory Visit
Admission: RE | Admit: 2015-06-29 | Discharge: 2015-06-29 | Disposition: A | Discharge status: Home / Self Care | Payer: Commercial Managed Care - HMO | Source: Ambulatory Visit | Attending: Radiation Oncology | Admitting: Radiation Oncology

## 2015-06-29 ENCOUNTER — Telehealth: Payer: Self-pay | Admitting: Internal Medicine

## 2015-06-29 VITALS — BP 141/87 | HR 68 | Resp 16 | Wt 218.1 lb

## 2015-06-29 DIAGNOSIS — Z87891 Personal history of nicotine dependence: Secondary | ICD-10-CM | POA: Diagnosis not present

## 2015-06-29 DIAGNOSIS — C3431 Malignant neoplasm of lower lobe, right bronchus or lung: Secondary | ICD-10-CM

## 2015-06-29 NOTE — Telephone Encounter (Signed)
new patient appt-s/w patient dtr Willette Cluster and gave np appt for 07/19 @ 11 w/Dr.Mohamed referrring Dr. Tammi Klippel

## 2015-06-29 NOTE — Progress Notes (Signed)
  Radiation Oncology         [phone]   Name: John [name] MRN: [id number]   Date: 06/29/2015  DOB: February 23, 1946   Weekly Radiation Therapy Management  Diagnosis:    ICD-9-CM ICD-10-CM   1. Primary cancer of right lower lobe of lung 162.5 C34.31      Current Dose: 8 Gy  Planned Dose:  66 Gy  Narrative The patient presents for routine under treatment assessment. The patient's weight and vitals are stable. He denies pain, pain when swallowing, fatigue, skin changes, cough, or SOB. However, patient does report difficulty with swallowing. He presents with a healthy mental disposition. Set-up films were reviewed and the chart was checked.  Physical Findings  weight is 218 lb 1.6 oz (98.93 kg). His blood pressure is 141/87 and his pulse is 68. His respiration is 16. . Weight is currently essentially stable with no significant changes in overall health noted. The patient is alert and oriented X3.  Impression John [name] is a 69 year old gentleman presenting to clinic in regards to his primary cancer of right lower lobe of lung. The patient is tolerating radiation.  Plan Continue treatment as planned. The patient was advised of expected side effects and processes to expect from treatment with the patient. We additionally discussed possible methods to address such side effects if they are to occur and emphasized the important of building caloric intake during treatment.. The patient was made aware of his appointment with radiation oncology next week.   This document serves as a record of services personally performed by Tyler Pita, MD. It was created on his behalf by Lenn Cal, a trained medical scribe. The creation of this record is based on the scribe's personal observations and the provider's statements to them. This document has been checked and approved by the attending  provider.  _______________________________________________________________________________________________________________________________  Sheral Apley. Tammi Klippel, M.D.

## 2015-06-29 NOTE — Progress Notes (Addendum)
Weight and vitals stable. Denies pain. Reports difficulty swallowing. Denies pain associated with swallowing. Denies cough or SOB. No skin changes within treatment field noted. Denies fatigue.  BP 141/87 mmHg  Pulse 68  Resp 16  Wt 218 lb 1.6 oz (98.93 kg) Wt Readings from Last 3 Encounters:  06/29/15 218 lb 1.6 oz (98.93 kg)  06/16/15 219 lb 12.8 oz (99.701 kg)  06/14/15 219 lb 6.4 oz (99.519 kg)   Oriented patient to staff and routine of the clinic. Provided patient with RADIATION THERAPY AND YOU handbook then, reviewed pertinent information. Educated patient reference potential side effects and management such as, fatigue, skin changes, cough, and throat changes. Provided patient with radiaplex and directed upon use. Patient verbalized understanding of all reviewed.

## 2015-06-30 ENCOUNTER — Ambulatory Visit
Admission: RE | Admit: 2015-06-30 | Discharge: 2015-06-30 | Disposition: A | Discharge status: Home / Self Care | Payer: Commercial Managed Care - HMO | Source: Ambulatory Visit | Attending: Radiation Oncology | Admitting: Radiation Oncology

## 2015-06-30 DIAGNOSIS — Z87891 Personal history of nicotine dependence: Secondary | ICD-10-CM | POA: Diagnosis not present

## 2015-06-30 DIAGNOSIS — C3431 Malignant neoplasm of lower lobe, right bronchus or lung: Secondary | ICD-10-CM | POA: Diagnosis not present

## 2015-07-04 ENCOUNTER — Ambulatory Visit
Admission: RE | Admit: 2015-07-04 | Discharge: 2015-07-04 | Disposition: A | Discharge status: Home / Self Care | Payer: Commercial Managed Care - HMO | Source: Ambulatory Visit | Attending: Radiation Oncology | Admitting: Radiation Oncology

## 2015-07-04 DIAGNOSIS — Z87891 Personal history of nicotine dependence: Secondary | ICD-10-CM | POA: Diagnosis not present

## 2015-07-04 DIAGNOSIS — C3431 Malignant neoplasm of lower lobe, right bronchus or lung: Secondary | ICD-10-CM | POA: Diagnosis not present

## 2015-07-04 MED ORDER — RADIAPLEXRX EX GEL
Freq: Once | CUTANEOUS | Status: AC
  Administered 2015-07-04: 11:00:00 via TOPICAL

## 2015-07-04 NOTE — Addendum Note (Signed)
Encounter addended by: Heywood Footman, RN on: 07/04/2015 10:38 AM<BR>     Documentation filed: Dx Association, Inpatient MAR, Orders

## 2015-07-04 NOTE — Addendum Note (Signed)
Encounter addended by: Heywood Footman, RN on: 07/04/2015 10:38 AM<BR>     Documentation filed: Orders, Dx Association, Inpatient MAR, Inpatient Patient Education, Notes Section, Chief Complaint Section

## 2015-07-05 ENCOUNTER — Ambulatory Visit
Admission: RE | Admit: 2015-07-05 | Discharge: 2015-07-05 | Disposition: A | Discharge status: Home / Self Care | Payer: Commercial Managed Care - HMO | Source: Ambulatory Visit | Attending: Radiation Oncology | Admitting: Radiation Oncology

## 2015-07-05 DIAGNOSIS — C3431 Malignant neoplasm of lower lobe, right bronchus or lung: Secondary | ICD-10-CM | POA: Diagnosis not present

## 2015-07-05 DIAGNOSIS — Z87891 Personal history of nicotine dependence: Secondary | ICD-10-CM | POA: Diagnosis not present

## 2015-07-06 ENCOUNTER — Encounter: Payer: Self-pay | Admitting: Radiation Oncology

## 2015-07-06 ENCOUNTER — Ambulatory Visit
Admission: RE | Admit: 2015-07-06 | Discharge: 2015-07-06 | Disposition: A | Discharge status: Home / Self Care | Payer: Commercial Managed Care - HMO | Source: Ambulatory Visit | Attending: Radiation Oncology | Admitting: Radiation Oncology

## 2015-07-06 VITALS — BP 159/84 | HR 68 | Resp 18 | Wt 213.9 lb

## 2015-07-06 DIAGNOSIS — C3431 Malignant neoplasm of lower lobe, right bronchus or lung: Secondary | ICD-10-CM

## 2015-07-06 DIAGNOSIS — Z87891 Personal history of nicotine dependence: Secondary | ICD-10-CM | POA: Diagnosis not present

## 2015-07-06 MED ORDER — SUCRALFATE 1 G PO TABS: 1.0000 g | ORAL_TABLET | Freq: Three times a day (TID) | ORAL | Status: DC

## 2015-07-06 NOTE — Progress Notes (Signed)
Weight and vitals stable. Denies pain. Mild hyperpigmentation within treatment field noted. Reports using radiaplex as directed. Denies cough, shortness of breath or fatigue. Denies difficulty swallowing.   BP 159/84 mmHg  Pulse 68  Resp 18  Wt 213 lb 14.4 oz (97.024 kg)  SpO2 100% Wt Readings from Last 3 Encounters:  07/06/15 213 lb 14.4 oz (97.024 kg)  06/29/15 218 lb 1.6 oz (98.93 kg)  06/16/15 219 lb 12.8 oz (99.701 kg)

## 2015-07-06 NOTE — Progress Notes (Signed)
  Radiation Oncology         [phone]   Name: John [name] MRN: [id number]   Date: 07/06/2015  DOB: September 13, 1946   Weekly Radiation Therapy Management  Diagnosis:    ICD-9-CM ICD-10-CM   1. Primary cancer of right lower lobe of lung 162.5 C34.31 sucralfate (CARAFATE) 1 G tablet      Current Dose: 16 Gy  Planned Dose:  66 Gy  Narrative The patient presents for routine under treatment assessment. His weight and vitals are stable. The patient currently denies symptoms of pain, cough, shortness of breath or fatigue. The patient also denies difficulty swallowing. [name] reports mild hyperpigmentation within treatment field noted. Reports using radiaplex as directed. He expressed mild concern about swallowing difficulties, which were addressed.  Physical Findings  weight is 213 lb 14.4 oz (97.024 kg). His blood pressure is 159/84 and his pulse is 68. His respiration is 18 and oxygen saturation is 100%. Marland Kitchen His weight is currently essentially stable with no significant changes in overall health noted, as of today. The patient is alert and oriented X3.   Impression John [name] is a 69 year old gentleman presenting to clinic in regards to his primary cancer of right lower lobe of lung. The patient is tolerating radiation.  Plan Symptoms that may occur at this point in the patient's treatment and possible medications that will asssit in alleviating such symptoms were reviewed. The patient was advised to continue treatment as planned and reccommended to administer Carafate as needed, when expierecing difficulty swallowing. Directions on how to administer Carafate was reviewed and understood by the patient. Any questions and concerns vocalized by the patient was addressed.    This document serves as a record of services personally performed by Tyler Pita, MD. It was created on his behalf by Lenn Cal, a trained medical scribe. The creation of this record is based on the scribe's personal  observations and the provider's statements to them. This document has been checked and approved by the attending provider.  _______________________________________________________________________________________________________________________________  Sheral Apley. Tammi Klippel, M.D.

## 2015-07-07 ENCOUNTER — Ambulatory Visit
Admission: RE | Admit: 2015-07-07 | Discharge: 2015-07-07 | Disposition: A | Discharge status: Home / Self Care | Payer: Commercial Managed Care - HMO | Source: Ambulatory Visit | Attending: Radiation Oncology | Admitting: Radiation Oncology

## 2015-07-07 DIAGNOSIS — Z87891 Personal history of nicotine dependence: Secondary | ICD-10-CM | POA: Diagnosis not present

## 2015-07-07 DIAGNOSIS — C3431 Malignant neoplasm of lower lobe, right bronchus or lung: Secondary | ICD-10-CM | POA: Diagnosis not present

## 2015-07-10 ENCOUNTER — Ambulatory Visit
Admission: RE | Admit: 2015-07-10 | Discharge: 2015-07-10 | Disposition: A | Discharge status: Home / Self Care | Payer: Commercial Managed Care - HMO | Source: Ambulatory Visit | Attending: Radiation Oncology | Admitting: Radiation Oncology

## 2015-07-10 DIAGNOSIS — C3431 Malignant neoplasm of lower lobe, right bronchus or lung: Secondary | ICD-10-CM | POA: Diagnosis not present

## 2015-07-10 DIAGNOSIS — Z87891 Personal history of nicotine dependence: Secondary | ICD-10-CM | POA: Diagnosis not present

## 2015-07-11 ENCOUNTER — Ambulatory Visit
Admission: RE | Admit: 2015-07-11 | Discharge: 2015-07-11 | Disposition: A | Discharge status: Home / Self Care | Payer: Commercial Managed Care - HMO | Source: Ambulatory Visit | Attending: Radiation Oncology | Admitting: Radiation Oncology

## 2015-07-11 DIAGNOSIS — C3431 Malignant neoplasm of lower lobe, right bronchus or lung: Secondary | ICD-10-CM | POA: Diagnosis not present

## 2015-07-11 DIAGNOSIS — Z87891 Personal history of nicotine dependence: Secondary | ICD-10-CM | POA: Diagnosis not present

## 2015-07-12 ENCOUNTER — Ambulatory Visit
Admission: RE | Admit: 2015-07-12 | Discharge: 2015-07-12 | Disposition: A | Discharge status: Home / Self Care | Payer: Commercial Managed Care - HMO | Source: Ambulatory Visit | Attending: Radiation Oncology | Admitting: Radiation Oncology

## 2015-07-12 DIAGNOSIS — C3431 Malignant neoplasm of lower lobe, right bronchus or lung: Secondary | ICD-10-CM | POA: Diagnosis not present

## 2015-07-12 DIAGNOSIS — Z87891 Personal history of nicotine dependence: Secondary | ICD-10-CM | POA: Diagnosis not present

## 2015-07-13 ENCOUNTER — Other Ambulatory Visit: Payer: Self-pay | Admitting: Internal Medicine

## 2015-07-13 ENCOUNTER — Ambulatory Visit
Admission: RE | Admit: 2015-07-13 | Discharge: 2015-07-13 | Disposition: A | Discharge status: Home / Self Care | Payer: Commercial Managed Care - HMO | Source: Ambulatory Visit | Attending: Radiation Oncology | Admitting: Radiation Oncology

## 2015-07-13 DIAGNOSIS — R0600 Dyspnea, unspecified: Secondary | ICD-10-CM

## 2015-07-13 DIAGNOSIS — Z87891 Personal history of nicotine dependence: Secondary | ICD-10-CM | POA: Diagnosis not present

## 2015-07-13 DIAGNOSIS — C3431 Malignant neoplasm of lower lobe, right bronchus or lung: Secondary | ICD-10-CM | POA: Diagnosis not present

## 2015-07-14 ENCOUNTER — Encounter: Payer: Self-pay | Admitting: Radiation Oncology

## 2015-07-14 ENCOUNTER — Ambulatory Visit: Payer: Commercial Managed Care - HMO | Admitting: Internal Medicine

## 2015-07-14 ENCOUNTER — Ambulatory Visit
Admission: RE | Admit: 2015-07-14 | Discharge: 2015-07-14 | Disposition: A | Discharge status: Home / Self Care | Payer: Commercial Managed Care - HMO | Source: Ambulatory Visit | Attending: Radiation Oncology | Admitting: Radiation Oncology

## 2015-07-14 VITALS — BP 157/80 | HR 63 | Resp 18 | Wt 214.2 lb

## 2015-07-14 DIAGNOSIS — C3431 Malignant neoplasm of lower lobe, right bronchus or lung: Secondary | ICD-10-CM

## 2015-07-14 DIAGNOSIS — Z87891 Personal history of nicotine dependence: Secondary | ICD-10-CM | POA: Diagnosis not present

## 2015-07-14 NOTE — Progress Notes (Signed)
Weight and vitals stable. Denies pain. Mild hyperpigmentation within treatment field without desquamation noted. Reports using radiaplex bid as directed. Denies cough. Reports SOB with exertion. Denies a sore throat or difficulty swallowing.  BP 157/80 mmHg  Pulse 63  Resp 18  Wt 214 lb 3.2 oz (97.16 kg)  SpO2 95% Wt Readings from Last 3 Encounters:  07/14/15 214 lb 3.2 oz (97.16 kg)  07/06/15 213 lb 14.4 oz (97.024 kg)  06/29/15 218 lb 1.6 oz (98.93 kg)

## 2015-07-14 NOTE — Progress Notes (Signed)
  Radiation Oncology         [phone]   Name: John [name] MRN: [id number]   Date: 07/14/2015  DOB: [birth year]   Weekly Radiation Therapy Management  Diagnosis:    ICD-9-CM ICD-10-CM   1. Primary cancer of right lower lobe of lung 162.5 C34.31       Current Dose: 16 Gy  Planned Dose:  66 Gy  Narrative The patient presents for routine under treatment assessment.  Weight and vitals stable. Denies pain. Mild hyperpigmentation within treatment field without desquamation noted. Reports using radiaplex bid as directed. Denies cough. Reports SOB with exertion. Denies a sore throat or difficulty swallowing.  Carafate helping. BP 157/80 mmHg  Pulse 63  Resp 18  Wt 214 lb 3.2 oz (97.16 kg)  SpO2 95% Wt Readings from Last 3 Encounters:  07/14/15 214 lb 3.2 oz (97.16 kg)  07/06/15 213 lb 14.4 oz (97.024 kg)  06/29/15 218 lb 1.6 oz (98.93 kg)    Physical Findings  weight is 214 lb 3.2 oz (97.16 kg). His blood pressure is 157/80 and his pulse is 63. His respiration is 18 and oxygen saturation is 95%. Marland Kitchen His weight is currently essentially stable with no significant changes in overall health noted, as of today. The patient is alert and oriented X3.   Impression The patient is tolerating radiation.  Plan  The patient was advised to continue treatment as planned    This document serves as a record of services personally performed by Tyler Pita, MD. It was created on his behalf by Lenn Cal, a trained medical scribe. The creation of this record is based on the scribe's personal observations and the provider's statements to them. This document has been checked and approved by the attending provider.  _______________________________________________________________________________________________________________________________  Sheral Apley. Tammi Klippel, M.D.

## 2015-07-17 ENCOUNTER — Ambulatory Visit
Admission: RE | Admit: 2015-07-17 | Discharge: 2015-07-17 | Disposition: A | Discharge status: Home / Self Care | Payer: Commercial Managed Care - HMO | Source: Ambulatory Visit | Attending: Radiation Oncology | Admitting: Radiation Oncology

## 2015-07-17 DIAGNOSIS — C3431 Malignant neoplasm of lower lobe, right bronchus or lung: Secondary | ICD-10-CM | POA: Diagnosis not present

## 2015-07-17 DIAGNOSIS — Z87891 Personal history of nicotine dependence: Secondary | ICD-10-CM | POA: Diagnosis not present

## 2015-07-18 ENCOUNTER — Ambulatory Visit (HOSPITAL_BASED_OUTPATIENT_CLINIC_OR_DEPARTMENT_OTHER): Payer: Commercial Managed Care - HMO | Admitting: Internal Medicine

## 2015-07-18 ENCOUNTER — Ambulatory Visit
Admission: RE | Admit: 2015-07-18 | Discharge: 2015-07-18 | Disposition: A | Discharge status: Home / Self Care | Payer: Commercial Managed Care - HMO | Source: Ambulatory Visit | Attending: Radiation Oncology | Admitting: Radiation Oncology

## 2015-07-18 ENCOUNTER — Encounter: Payer: Self-pay | Admitting: Internal Medicine

## 2015-07-18 ENCOUNTER — Other Ambulatory Visit: Payer: Self-pay | Admitting: Internal Medicine

## 2015-07-18 ENCOUNTER — Ambulatory Visit: Payer: Commercial Managed Care - HMO

## 2015-07-18 ENCOUNTER — Other Ambulatory Visit (HOSPITAL_BASED_OUTPATIENT_CLINIC_OR_DEPARTMENT_OTHER): Payer: Commercial Managed Care - HMO

## 2015-07-18 VITALS — BP 137/63 | HR 66 | Temp 97.5°F | Resp 17 | Ht 67.0 in | Wt 218.4 lb

## 2015-07-18 DIAGNOSIS — C3431 Malignant neoplasm of lower lobe, right bronchus or lung: Secondary | ICD-10-CM | POA: Diagnosis not present

## 2015-07-18 DIAGNOSIS — Z87891 Personal history of nicotine dependence: Secondary | ICD-10-CM | POA: Diagnosis not present

## 2015-07-18 DIAGNOSIS — J441 Chronic obstructive pulmonary disease with (acute) exacerbation: Secondary | ICD-10-CM | POA: Diagnosis not present

## 2015-07-18 LAB — CBC WITH DIFFERENTIAL/PLATELET
BASO%: 0.6 % (ref 0.0–2.0)
Basophils Absolute: 0 10*3/uL (ref 0.0–0.1)
EOS ABS: 0.2 10*3/uL (ref 0.0–0.5)
EOS%: 2.8 % (ref 0.0–7.0)
HCT: 42.3 % (ref 38.4–49.9)
HGB: 13.6 g/dL (ref 13.0–17.1)
LYMPH%: 11 % — ABNORMAL LOW (ref 14.0–49.0)
MCH: 31.3 pg (ref 27.2–33.4)
MCHC: 32.2 g/dL (ref 32.0–36.0)
MCV: 97.3 fL (ref 79.3–98.0)
MONO#: 0.7 10*3/uL (ref 0.1–0.9)
MONO%: 9.2 % (ref 0.0–14.0)
NEUT%: 76.4 % — ABNORMAL HIGH (ref 39.0–75.0)
NEUTROS ABS: 6 10*3/uL (ref 1.5–6.5)
PLATELETS: 175 10*3/uL (ref 140–400)
RBC: 4.34 10*6/uL (ref 4.20–5.82)
RDW: 13 % (ref 11.0–14.6)
WBC: 7.8 10*3/uL (ref 4.0–10.3)
lymph#: 0.9 10*3/uL (ref 0.9–3.3)

## 2015-07-18 LAB — COMPREHENSIVE METABOLIC PANEL (CC13)
ALBUMIN: 3.9 g/dL (ref 3.5–5.0)
ALT: 20 U/L (ref 0–55)
ANION GAP: 10 meq/L (ref 3–11)
AST: 18 U/L (ref 5–34)
Alkaline Phosphatase: 91 U/L (ref 40–150)
BILIRUBIN TOTAL: 0.52 mg/dL (ref 0.20–1.20)
BUN: 12.3 mg/dL (ref 7.0–26.0)
CHLORIDE: 104 meq/L (ref 98–109)
CO2: 29 meq/L (ref 22–29)
Calcium: 9.4 mg/dL (ref 8.4–10.4)
Creatinine: 1.2 mg/dL (ref 0.7–1.3)
EGFR: 68 mL/min/{1.73_m2} — AB (ref >90)
GLUCOSE: 161 mg/dL — AB (ref 70–140)
Potassium: 3.5 mEq/L (ref 3.5–5.1)
SODIUM: 143 meq/L (ref 136–145)
Total Protein: 7.5 g/dL (ref 6.4–8.3)

## 2015-07-18 NOTE — Progress Notes (Signed)
[name] [phone]   [phone]  CONSULT NOTE  REFERRING PHYSICIAN: Dr. Tyler [name]  REASON FOR CONSULTATION:  69 years old African-American male recently diagnosed with lung cancer.  HPI John [name] is a 69 y.o. male with past medical history significant for COPD, hypertension, congestive heart failure, coronary artery disease, pulmonary hypertension, dyslipidemia as well as long history of smoking, alcohol abuse and drug abuse but quit 4 years ago. Few months ago the patient was seen by his cardiologist Dr. Claiborne [name] and has been complaining of worsening dyspnea. He was referred to Dr. Melvyn [name] for evaluation and chest x-ray on 03/13/2015 showed new right mid lung nodule. This was followed by CT scan of the chest on 03/27/2015 and showed spiculated superior segment right lower lobe pulmonary nodule measuring 2.0 x 1.9 x 1.9 cm consistent with primary bronchogenic carcinoma. There was no evidence of thoracic nodal metastasis. A PET scan was performed on 04/13/2015 and it showed 2.1 cm hypermetabolic nodule in this. Segment of the right lower lobe consistent with primary bronchogenic carcinoma. There was a single 1.0 cm prevascular anterior mediastinal lymph nodes that was also hypermetabolic and no other hypermetabolic nodes are identified in the thorax. The differential diagnosis included reactive and metastatic lymphadenopathy. There was no evidence of metastatic disease within the neck, abdomen or pelvis. The patient was evaluated by Dr. Roxan [name] for surgical resection but because of his long history of smoking and COPD as well as other comorbidities he was not a surgical candidate for resection. The patient was referred to Dr. Tammi [name] and he ordered CT guided biopsy of the right lower lobe lung nodule which was performed on 06/05/2015 and the final pathology (Accession: [id number]) was positive for squamous cell carcinoma. Dr. Tammi [name] started the  patient on curative radiotherapy to the right lower lobe lung nodule and mediastinum. He has 3 more weeks of radiation left. He was referred to me today for evaluation and recommendation regarding systemic chemotherapy. When seen today the patient is feeling fine with no specific complaints except for dry cough. He denied having any dysphagia. He denied having any chest pain, shortness of breath or hemoptysis. He denied having any significant weight loss or night sweats. Family history: Father and mother died from old age. The patient is single and has 2 children. He is to lay bricks. He has a history of smoking 2 pack per day for around 55 years and quit 4 years ago. The patient also has a history of heavy alcohol and drug abuse but quit 4 years ago. HPI  Past Medical History  Diagnosis Date  . CHF (congestive heart failure)   . MI (myocardial infarction)   . COPD (chronic obstructive pulmonary disease)   . Diabetes mellitus   . Hypertension   . Gout   . Hyperlipemia   . Pulmonary hypertension   . 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 Appendectomy    . Lung biopsy      Family History  Problem Relation Age of Onset  . Heart disease Mother   . Cancer Neg Hx     Social History History  Substance Use Topics  . Smoking status: Former Smoker -- 1.50 packs/day for 60 years    Types: Cigarettes    Quit date: 09/29/2012  . Smokeless tobacco: Never Used  . Alcohol Use: No    Allergies  Allergen Reactions  . Levaquin [Levofloxacin In D5w]     Pt states he is not allergic to medication, last took it about 8 years ago  . Lisinopril Swelling    Current Outpatient Prescriptions  Medication Sig Dispense Refill  . albuterol (PROAIR HFA) 108 (90 BASE) MCG/ACT inhaler INHALE 2 PUFFS BY MOUTH EVERY 4 HOURS AS NEEDED FOR WHEEZING 1 Inhaler 11  . allopurinol (ZYLOPRIM) 100 MG tablet TAKE 1 TABLET BY  MOUTH EVERY DAY 30 tablet 5  . amLODipine (NORVASC) 10 MG tablet TAKE 1 TABLET BY MOUTH EVERY DAY 30 tablet 5  . atorvastatin (LIPITOR) 20 MG tablet Take 1 tablet (20 mg total) by mouth daily. 90 tablet 3  . bisoprolol (ZEBETA) 5 MG tablet Take 5 mg by mouth daily.     . cloNIDine (CATAPRES) 0.1 MG tablet Take 0.1 mg by mouth 2 (two) times daily.    . colchicine 0.6 MG tablet TAKE 1 TABLET BY MOUTH EVERY DAY 30 tablet 4  . fenofibrate (TRICOR) 48 MG tablet TAKE 1 TABLET BY MOUTH EVERY DAY  NEEDS OFFICE VISIT (Patient taking differently: TAKE 1 TABLET BY MOUTH EVERY DAY  NEEDS OFFICE VISIT) 30 tablet 5  . furosemide (LASIX) 20 MG tablet TAKE 1 TABLET BY MOUTH DAILY 30 tablet 5  . polyethylene glycol powder (GLYCOLAX/MIRALAX) powder DISSOLVE 17 GRAMS (1 CAPFUL) INTO 8 OUNCES OF LIQUID AND DRINK EVERY DAY (Patient taking differently: DISSOLVE 17 GRAMS (1 CAPFUL) INTO 8 OUNCES OF LIQUID AND DRINK EVERY DAY as needed for constipation) 527 g 11  . spironolactone (ALDACTONE) 25 MG tablet TAKE 1/2 TABLET BY MOUTH EVERY DAY   NEED OFFICE VISIT (Patient taking differently: TAKE 1/2 TABLET BY MOUTH EVERY DAY   NEED OFFICE VISIT) 30 tablet 5  . sucralfate (CARAFATE) 1 G tablet Take 1 tablet (1 g total) by mouth 4 (four) times daily -  with meals and at bedtime. 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 SYMBICORT 160-4.5 MCG/ACT inhaler INHALE 2 PUFFS BY MOUTH EVERY 12 HOURS (FIRST THING IN MORNING THEN 12 HOURS LATER) 1 Inhaler 0  . VENTOLIN HFA 108 (90 BASE) MCG/ACT inhaler INHALE 2 PUFFS BY MOUTH EVERY 4 HOURS AS NEEDED FOR WHEEZING 1 Inhaler 0   No current facility-administered medications for this visit.    Review of Systems  Constitutional: positive for fatigue Eyes: negative Ears, nose, mouth, throat, and face: negative Respiratory: positive for cough and dyspnea on exertion Cardiovascular: negative Gastrointestinal: negative Genitourinary:negative Integument/breast:  negative Hematologic/lymphatic: negative Musculoskeletal:negative Neurological: negative Behavioral/Psych: negative Endocrine: negative Allergic/Immunologic: negative  Physical Exam  VFI:EPPIR, healthy, no distress, well nourished and well developed SKIN: skin color, texture, turgor are normal, no rashes or significant lesions HEAD: Normocephalic, No masses, lesions, tenderness or abnormalities EYES: normal, PERRLA, Conjunctiva are pink and non-injected EARS: External ears normal, Canals clear OROPHARYNX:no exudate, no erythema and lips, buccal mucosa, and tongue normal  NECK: supple, no adenopathy, no JVD LYMPH:  no palpable lymphadenopathy, no hepatosplenomegaly LUNGS: clear to auscultation , and palpation HEART: regular rate & rhythm, no murmurs and no gallops ABDOMEN:abdomen soft, non-tender, normal bowel sounds and no masses or organomegaly BACK: Back symmetric, no curvature., No CVA tenderness EXTREMITIES:no joint deformities, effusion, or inflammation, no edema, no skin discoloration  NEURO: alert & oriented x 3 with fluent speech, no focal motor/sensory deficits  PERFORMANCE STATUS: ECOG 1  LABORATORY DATA: Lab Results  Component Value Date   WBC 7.8 07/18/2015  HGB 13.6 07/18/2015   HCT 42.3 07/18/2015   MCV 97.3 07/18/2015   PLT 175 07/18/2015      Chemistry      Component Value Date/Time   NA 143 07/18/2015 1109   NA 140 03/15/2015 0921   K 3.5 07/18/2015 1109   K 3.6 03/15/2015 0921   CL 101 03/15/2015 0921   CO2 29 07/18/2015 1109   CO2 30 03/15/2015 0921   BUN 12.3 07/18/2015 1109   BUN 14 03/15/2015 0921   CREATININE 1.2 07/18/2015 1109   CREATININE 1.23 03/15/2015 0921      Component Value Date/Time   CALCIUM 9.4 07/18/2015 1109   CALCIUM 9.6 03/15/2015 0921   ALKPHOS 91 07/18/2015 1109   ALKPHOS 62 12/06/2012 1502   AST 18 07/18/2015 1109   AST 18 12/06/2012 1502   ALT 20 07/18/2015 1109   ALT 10 12/06/2012 1502   BILITOT 0.52 07/18/2015  1109   BILITOT 0.6 12/06/2012 1502       RADIOGRAPHIC STUDIES: No results found.  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 The patient is currently undergoing a course of curative radiotherapy to the right lower lobe lung nodule as well as mediastinal lymphadenopathy under the care of Dr. Tammi [name]. He has 3 more weeks of treatment left. Unfortunately the patient was not seen earlier to discuss concurrent chemotherapy with radiation.  PLAN: I had a lengthy discussion with the patient today about his current condition and treatment options. I explained to the patient that the standard of care is to treat him with a course of concurrent chemoradiation but he already received more than 3 weeks of radiation so far and it may be too late to add the concurrent chemotherapy at this point. I discussed with the patient as well as Dr. Tammi [name] continue with the current curative radiotherapy as a scheduled. I will arrange for the patient to have repeat CT scan of the chest in 4 months for restaging of his disease. If he has any residual disease or evidence for disease progression, I would consider the patient for systemic chemotherapy. The patient agreed to the current plan. He was advised to call immediately if he has any concerning symptoms in the interval. The patient voices understanding of current disease status and treatment options and is in agreement with the current care plan.  All questions were answered. The patient knows to call the clinic with any problems, questions or concerns. We can certainly see the patient much sooner if necessary.  Thank you so much for allowing me to participate in the care of John [name]. I will continue to follow up the patient with you and assist in his care.  I spent 40 minutes counseling the patient face to face.  The total time spent in the appointment was 60 minutes.  Disclaimer: This note was dictated with voice recognition software. Similar sounding words can inadvertently be transcribed and may not be corrected upon review.   Breelle Hollywood K. July 18, 2015, 12:24 PM

## 2015-07-18 NOTE — Progress Notes (Signed)
Checked in new pt with no financial concerns. °

## 2015-07-19 ENCOUNTER — Telehealth: Payer: Self-pay | Admitting: Internal Medicine

## 2015-07-19 ENCOUNTER — Ambulatory Visit
Admission: RE | Admit: 2015-07-19 | Discharge: 2015-07-19 | Disposition: A | Discharge status: Home / Self Care | Payer: Commercial Managed Care - HMO | Source: Ambulatory Visit | Attending: Radiation Oncology | Admitting: Radiation Oncology

## 2015-07-19 DIAGNOSIS — C3431 Malignant neoplasm of lower lobe, right bronchus or lung: Secondary | ICD-10-CM | POA: Diagnosis not present

## 2015-07-19 DIAGNOSIS — Z87891 Personal history of nicotine dependence: Secondary | ICD-10-CM | POA: Diagnosis not present

## 2015-07-19 NOTE — Telephone Encounter (Signed)
lvm for pt regarding to NOV appt...mailed pt appt sched/avs and letter °

## 2015-07-20 ENCOUNTER — Ambulatory Visit
Admission: RE | Admit: 2015-07-20 | Discharge: 2015-07-20 | Disposition: A | Discharge status: Home / Self Care | Payer: Commercial Managed Care - HMO | Source: Ambulatory Visit | Attending: Radiation Oncology | Admitting: Radiation Oncology

## 2015-07-20 DIAGNOSIS — Z87891 Personal history of nicotine dependence: Secondary | ICD-10-CM | POA: Diagnosis not present

## 2015-07-20 DIAGNOSIS — C3431 Malignant neoplasm of lower lobe, right bronchus or lung: Secondary | ICD-10-CM | POA: Diagnosis not present

## 2015-07-21 ENCOUNTER — Ambulatory Visit
Admission: RE | Admit: 2015-07-21 | Discharge: 2015-07-21 | Disposition: A | Discharge status: Home / Self Care | Payer: Commercial Managed Care - HMO | Source: Ambulatory Visit | Attending: Radiation Oncology | Admitting: Radiation Oncology

## 2015-07-21 ENCOUNTER — Ambulatory Visit
Admission: RE | Admit: 2015-07-21 | Payer: Commercial Managed Care - HMO | Source: Ambulatory Visit | Admitting: Radiation Oncology

## 2015-07-21 ENCOUNTER — Other Ambulatory Visit: Payer: Self-pay | Admitting: Internal Medicine

## 2015-07-21 DIAGNOSIS — C3431 Malignant neoplasm of lower lobe, right bronchus or lung: Secondary | ICD-10-CM | POA: Diagnosis not present

## 2015-07-21 DIAGNOSIS — Z87891 Personal history of nicotine dependence: Secondary | ICD-10-CM | POA: Diagnosis not present

## 2015-07-24 ENCOUNTER — Ambulatory Visit
Admission: RE | Admit: 2015-07-24 | Discharge: 2015-07-24 | Disposition: A | Discharge status: Home / Self Care | Payer: Commercial Managed Care - HMO | Source: Ambulatory Visit | Attending: Radiation Oncology | Admitting: Radiation Oncology

## 2015-07-24 ENCOUNTER — Encounter: Payer: Self-pay | Admitting: Radiation Oncology

## 2015-07-24 VITALS — BP 122/65 | HR 64 | Temp 97.6°F | Resp 20 | Wt 217.7 lb

## 2015-07-24 DIAGNOSIS — Z87891 Personal history of nicotine dependence: Secondary | ICD-10-CM | POA: Diagnosis not present

## 2015-07-24 DIAGNOSIS — C3431 Malignant neoplasm of lower lobe, right bronchus or lung: Secondary | ICD-10-CM | POA: Diagnosis not present

## 2015-07-24 NOTE — Progress Notes (Signed)
   Department of Radiation Oncology  Phone:  [phone] Fax:        [phone]  Weekly Treatment Note  Name: John [name] Date: 07/24/2015 MRN: [id number] DOB: 1946-03-10   Current dose: 40 Gy  Current fraction:20    ALLERGIES: Levaquin and Lisinopril   LABORATORY DATA:  Lab Results  Component Value Date   WBC 7.8 07/18/2015   HGB 13.6 07/18/2015   HCT 42.3 07/18/2015   MCV 97.3 07/18/2015   PLT 175 07/18/2015   Lab Results  Component Value Date   NA 143 07/18/2015   K 3.5 07/18/2015   CL 101 03/15/2015   CO2 29 07/18/2015   Lab Results  Component Value Date   ALT 20 07/18/2015   AST 18 07/18/2015   ALKPHOS 91 07/18/2015   BILITOT 0.52 07/18/2015     NARRATIVE: John [name] was seen today for weekly treatment management. The chart was checked and the patient's films were reviewed.  Weekly assessment of radiation to right lung.Completed 20 of 33 treatments.Denies pain, shortness of breath or cough.No skin changes.  PHYSICAL EXAMINATION: weight is 217 lb 11.2 oz (98.748 kg). His temperature is 97.6 F (36.4 C). His blood pressure is 122/65 and his pulse is 64. His respiration is 20 and oxygen saturation is 99%.        Skin: mild hyperpigmentation     ASSESSMENT: The patient is doing satisfactorily with treatment.  PLAN: We will continue with the patient's radiation treatment as planned.      ------------------------------------------------  Jodelle Gross, MD, PhD  This document serves as a record of services personally performed by Kyung Rudd, MD. It was created on his behalf by Derek Mound, a trained medical scribe. The creation of this record is based on the scribe's personal observations and the provider's statements to them. This document has been checked and approved by the attending provider.

## 2015-07-24 NOTE — Progress Notes (Addendum)
Weekly assessment of radiation to right lung.Completed 20 of 33 treatments.Denies pain, shortness of breath or cough.No skin changes.

## 2015-07-25 ENCOUNTER — Ambulatory Visit
Admission: RE | Admit: 2015-07-25 | Discharge: 2015-07-25 | Disposition: A | Discharge status: Home / Self Care | Payer: Commercial Managed Care - HMO | Source: Ambulatory Visit | Attending: Radiation Oncology | Admitting: Radiation Oncology

## 2015-07-25 DIAGNOSIS — C3431 Malignant neoplasm of lower lobe, right bronchus or lung: Secondary | ICD-10-CM | POA: Diagnosis not present

## 2015-07-25 DIAGNOSIS — Z87891 Personal history of nicotine dependence: Secondary | ICD-10-CM | POA: Diagnosis not present

## 2015-07-26 ENCOUNTER — Ambulatory Visit
Admission: RE | Admit: 2015-07-26 | Discharge: 2015-07-26 | Disposition: A | Discharge status: Home / Self Care | Payer: Commercial Managed Care - HMO | Source: Ambulatory Visit | Attending: Radiation Oncology | Admitting: Radiation Oncology

## 2015-07-26 DIAGNOSIS — C3431 Malignant neoplasm of lower lobe, right bronchus or lung: Secondary | ICD-10-CM | POA: Diagnosis not present

## 2015-07-26 DIAGNOSIS — Z87891 Personal history of nicotine dependence: Secondary | ICD-10-CM | POA: Diagnosis not present

## 2015-07-27 ENCOUNTER — Encounter: Payer: Self-pay | Admitting: Radiation Oncology

## 2015-07-27 ENCOUNTER — Ambulatory Visit
Admission: RE | Admit: 2015-07-27 | Discharge: 2015-07-27 | Disposition: A | Discharge status: Home / Self Care | Payer: Commercial Managed Care - HMO | Source: Ambulatory Visit | Attending: Radiation Oncology | Admitting: Radiation Oncology

## 2015-07-27 VITALS — BP 125/61 | HR 68 | Temp 98.7°F | Ht 67.0 in | Wt 219.6 lb

## 2015-07-27 DIAGNOSIS — C3431 Malignant neoplasm of lower lobe, right bronchus or lung: Secondary | ICD-10-CM

## 2015-07-27 DIAGNOSIS — J449 Chronic obstructive pulmonary disease, unspecified: Secondary | ICD-10-CM | POA: Diagnosis not present

## 2015-07-27 DIAGNOSIS — Z87891 Personal history of nicotine dependence: Secondary | ICD-10-CM | POA: Diagnosis not present

## 2015-07-27 NOTE — Progress Notes (Signed)
  Radiation Oncology         [phone]   Name: John [name] MRN: [id number]   Date: 07/27/2015  DOB: 1946-11-02   Weekly Radiation Therapy Management  Diagnosis: John [name] is a 69 year old gentleman presenting to clinic in regards to his primary cancer of right lower lobe of lung.    Current Dose: 46 Gy  Planned Dose:  66 Gy  Narrative The patient presents for routine under treatment assessment. His weight and vitals are stable. He currently denies pain. Mild hyperpigmentation within treatment field without desquamation noted. Reports using Radiaplex gel as needed. Denies cough. Reports SOB with exertion. Denies a sore throat or difficulty swallowing.  Carafate helping. BP 125/61 mmHg  Pulse 68  Temp(Src) 98.7 F (37.1 C)  Ht '5\' 7"'$  (1.702 m)  Wt 219 lb 9.6 oz (99.61 kg)  BMI 34.39 kg/m2 Wt Readings from Last 3 Encounters:  07/27/15 219 lb 9.6 oz (99.61 kg)  07/24/15 217 lb 11.2 oz (98.748 kg)  07/18/15 218 lb 6.4 oz (99.066 kg)    Physical Findings  height is '5\' 7"'$  (1.702 m) and weight is 219 lb 9.6 oz (99.61 kg). His temperature is 98.7 F (37.1 C). His blood pressure is 125/61 and his pulse is 68. Marland Kitchen His weight is currently essentially stable with no significant changes in overall health noted, as of today. The patient is alert and oriented X3.   Impression John [name] is a 69 year old gentleman presenting to clinic in regards to his primary cancer of right lower lobe of lung. The patient is tolerating radiation.  Plan The patient has been made aware that after radiation treatments are complete and a month of recovery time is complete, a CT scan is to be scheduled. Depending on the results of the CT scan to be scheduled, chemotherapy may be suggested. The patient was advised to continue treatment as planned.   This document serves as a record of services personally performed by Tyler Pita, MD. It was created on his behalf by Lenn Cal, a trained medical scribe.  The creation of this record is based on the scribe's personal observations and the provider's statements to them. This document has been checked and approved by the attending provider.  _______________________________________________________________________________________________________________________________  Sheral Apley. Tammi Klippel, M.D.

## 2015-07-27 NOTE — Progress Notes (Signed)
Mr. Sermon has received 23 fractions to his right lung.   He denies any pain, difficulty swallowing, SOB nor fatigue.    BP 125/61 mmHg  Pulse 68  Temp(Src) 98.7 F (37.1 C)  Ht '5\' 7"'$  (1.702 m)  Wt 219 lb 9.6 oz (99.61 kg)  BMI 34.39 kg/m2   Wt Readings from Last 3 Encounters:  07/27/15 219 lb 9.6 oz (99.61 kg)  07/24/15 217 lb 11.2 oz (98.748 kg)  07/18/15 218 lb 6.4 oz (99.066 kg)

## 2015-07-28 ENCOUNTER — Ambulatory Visit
Admission: RE | Admit: 2015-07-28 | Discharge: 2015-07-28 | Disposition: A | Discharge status: Home / Self Care | Payer: Commercial Managed Care - HMO | Source: Ambulatory Visit | Attending: Radiation Oncology | Admitting: Radiation Oncology

## 2015-07-28 DIAGNOSIS — Z87891 Personal history of nicotine dependence: Secondary | ICD-10-CM | POA: Diagnosis not present

## 2015-07-28 DIAGNOSIS — C3431 Malignant neoplasm of lower lobe, right bronchus or lung: Secondary | ICD-10-CM | POA: Diagnosis not present

## 2015-07-31 ENCOUNTER — Ambulatory Visit
Admission: RE | Admit: 2015-07-31 | Discharge: 2015-07-31 | Disposition: A | Discharge status: Home / Self Care | Payer: Commercial Managed Care - HMO | Source: Ambulatory Visit | Attending: Radiation Oncology | Admitting: Radiation Oncology

## 2015-07-31 DIAGNOSIS — Z87891 Personal history of nicotine dependence: Secondary | ICD-10-CM | POA: Diagnosis not present

## 2015-07-31 DIAGNOSIS — C3431 Malignant neoplasm of lower lobe, right bronchus or lung: Secondary | ICD-10-CM | POA: Diagnosis not present

## 2015-08-01 ENCOUNTER — Ambulatory Visit
Admission: RE | Admit: 2015-08-01 | Discharge: 2015-08-01 | Disposition: A | Discharge status: Home / Self Care | Payer: Commercial Managed Care - HMO | Source: Ambulatory Visit | Attending: Radiation Oncology | Admitting: Radiation Oncology

## 2015-08-01 DIAGNOSIS — Z87891 Personal history of nicotine dependence: Secondary | ICD-10-CM | POA: Diagnosis not present

## 2015-08-01 DIAGNOSIS — C3431 Malignant neoplasm of lower lobe, right bronchus or lung: Secondary | ICD-10-CM | POA: Diagnosis not present

## 2015-08-02 ENCOUNTER — Ambulatory Visit
Admission: RE | Admit: 2015-08-02 | Discharge: 2015-08-02 | Disposition: A | Discharge status: Home / Self Care | Payer: Commercial Managed Care - HMO | Source: Ambulatory Visit | Attending: Radiation Oncology | Admitting: Radiation Oncology

## 2015-08-02 DIAGNOSIS — Z87891 Personal history of nicotine dependence: Secondary | ICD-10-CM | POA: Diagnosis not present

## 2015-08-02 DIAGNOSIS — C3431 Malignant neoplasm of lower lobe, right bronchus or lung: Secondary | ICD-10-CM | POA: Diagnosis not present

## 2015-08-03 ENCOUNTER — Ambulatory Visit
Admission: RE | Admit: 2015-08-03 | Discharge: 2015-08-03 | Disposition: A | Discharge status: Home / Self Care | Payer: Commercial Managed Care - HMO | Source: Ambulatory Visit | Attending: Radiation Oncology | Admitting: Radiation Oncology

## 2015-08-03 DIAGNOSIS — Z87891 Personal history of nicotine dependence: Secondary | ICD-10-CM | POA: Diagnosis not present

## 2015-08-03 DIAGNOSIS — C3431 Malignant neoplasm of lower lobe, right bronchus or lung: Secondary | ICD-10-CM | POA: Diagnosis not present

## 2015-08-04 ENCOUNTER — Ambulatory Visit
Admission: RE | Admit: 2015-08-04 | Discharge: 2015-08-04 | Disposition: A | Discharge status: Home / Self Care | Payer: Commercial Managed Care - HMO | Source: Ambulatory Visit | Attending: Radiation Oncology | Admitting: Radiation Oncology

## 2015-08-04 ENCOUNTER — Encounter: Payer: Self-pay | Admitting: Radiation Oncology

## 2015-08-04 VITALS — BP 106/77 | HR 76 | Resp 16 | Wt 218.4 lb

## 2015-08-04 DIAGNOSIS — Z87891 Personal history of nicotine dependence: Secondary | ICD-10-CM | POA: Insufficient documentation

## 2015-08-04 DIAGNOSIS — C3431 Malignant neoplasm of lower lobe, right bronchus or lung: Secondary | ICD-10-CM | POA: Insufficient documentation

## 2015-08-04 MED ORDER — RADIAPLEXRX EX GEL
Freq: Once | CUTANEOUS | Status: AC
  Administered 2015-08-04: 08:00:00 via TOPICAL

## 2015-08-04 NOTE — Progress Notes (Signed)
Weight and vitals stable. Denies pain. Reports a dry cough. Denies difficulty swallowing. Reports hyperpigmentation within treatment field without desquamation. Reports using Radiaplex bid as directed. Denies fatigue.  BP 106/77 mmHg  Pulse 76  Resp 16  Wt 218 lb 6.4 oz (99.066 kg) Wt Readings from Last 3 Encounters:  08/04/15 218 lb 6.4 oz (99.066 kg)  07/27/15 219 lb 9.6 oz (99.61 kg)  07/24/15 217 lb 11.2 oz (98.748 kg)

## 2015-08-04 NOTE — Progress Notes (Signed)
   Department of Radiation Oncology  Phone:  [phone] Fax:        [phone]  Weekly Treatment Note    Name: John [name] Date: 08/04/2015 MRN: [id number] DOB: 1946-03-19   Current dose: 58 Gy  Current fraction:29    ALLERGIES: Levaquin and Lisinopril   LABORATORY DATA:  Lab Results  Component Value Date   WBC 7.8 07/18/2015   HGB 13.6 07/18/2015   HCT 42.3 07/18/2015   MCV 97.3 07/18/2015   PLT 175 07/18/2015   Lab Results  Component Value Date   NA 143 07/18/2015   K 3.5 07/18/2015   CL 101 03/15/2015   CO2 29 07/18/2015   Lab Results  Component Value Date   ALT 20 07/18/2015   AST 18 07/18/2015   ALKPHOS 91 07/18/2015   BILITOT 0.52 07/18/2015     NARRATIVE: John [name] was seen today for weekly treatment management. The chart was checked and the patient's films were reviewed.  Weight and vitals stable. Denies pain. Reports a dry cough. Denies difficulty swallowing. Reports hyperpigmentation within treatment field without desquamation. Reports using Radiaplex bid as directed. Denies fatigue.  PHYSICAL EXAMINATION: weight is 218 lb 6.4 oz (99.066 kg). His blood pressure is 106/77 and his pulse is 76. His respiration is 16.     Skin: One small area of hyperpigmentation at treatment area.      ASSESSMENT: The patient is doing satisfactorily with treatment.  PLAN: We will continue with the patient's radiation treatment as planned.      ------------------------------------------------  Jodelle Gross, MD, PhD  This document serves as a record of services personally performed by Kyung Rudd, MD. It was created on his behalf by Derek Mound, a trained medical scribe. The creation of this record is based on the scribe's personal observations and the provider's statements to them. This document has been checked and approved by the attending provider.

## 2015-08-04 NOTE — Addendum Note (Signed)
Encounter addended by: Heywood Footman, RN on: 08/04/2015  1:50 PM<BR>     Documentation filed: [employer], Inpatient MAR, Orders

## 2015-08-07 ENCOUNTER — Ambulatory Visit
Admission: RE | Admit: 2015-08-07 | Discharge: 2015-08-07 | Disposition: A | Discharge status: Home / Self Care | Payer: Commercial Managed Care - HMO | Source: Ambulatory Visit | Attending: Radiation Oncology | Admitting: Radiation Oncology

## 2015-08-07 DIAGNOSIS — C3431 Malignant neoplasm of lower lobe, right bronchus or lung: Secondary | ICD-10-CM | POA: Diagnosis not present

## 2015-08-07 DIAGNOSIS — Z87891 Personal history of nicotine dependence: Secondary | ICD-10-CM | POA: Diagnosis not present

## 2015-08-08 ENCOUNTER — Ambulatory Visit
Admission: RE | Admit: 2015-08-08 | Discharge: 2015-08-08 | Disposition: A | Discharge status: Home / Self Care | Payer: Commercial Managed Care - HMO | Source: Ambulatory Visit | Attending: Radiation Oncology | Admitting: Radiation Oncology

## 2015-08-08 DIAGNOSIS — C3431 Malignant neoplasm of lower lobe, right bronchus or lung: Secondary | ICD-10-CM | POA: Diagnosis not present

## 2015-08-08 DIAGNOSIS — Z87891 Personal history of nicotine dependence: Secondary | ICD-10-CM | POA: Diagnosis not present

## 2015-08-09 ENCOUNTER — Ambulatory Visit
Admission: RE | Admit: 2015-08-09 | Discharge: 2015-08-09 | Disposition: A | Discharge status: Home / Self Care | Payer: Commercial Managed Care - HMO | Source: Ambulatory Visit | Attending: Radiation Oncology | Admitting: Radiation Oncology

## 2015-08-09 DIAGNOSIS — Z87891 Personal history of nicotine dependence: Secondary | ICD-10-CM | POA: Diagnosis not present

## 2015-08-09 DIAGNOSIS — C3431 Malignant neoplasm of lower lobe, right bronchus or lung: Secondary | ICD-10-CM | POA: Diagnosis not present

## 2015-08-10 ENCOUNTER — Encounter: Payer: Self-pay | Admitting: Radiation Oncology

## 2015-08-10 ENCOUNTER — Ambulatory Visit
Admission: RE | Admit: 2015-08-10 | Discharge: 2015-08-10 | Disposition: A | Discharge status: Home / Self Care | Payer: Commercial Managed Care - HMO | Source: Ambulatory Visit | Attending: Radiation Oncology | Admitting: Radiation Oncology

## 2015-08-10 VITALS — BP 134/72 | HR 79 | Resp 16 | Wt 220.0 lb

## 2015-08-10 DIAGNOSIS — C3431 Malignant neoplasm of lower lobe, right bronchus or lung: Secondary | ICD-10-CM | POA: Diagnosis not present

## 2015-08-10 DIAGNOSIS — Z87891 Personal history of nicotine dependence: Secondary | ICD-10-CM | POA: Diagnosis not present

## 2015-08-10 NOTE — Progress Notes (Signed)
  Radiation Oncology         (336) [phone] ________________________________  Name: John [name] MRN: [id number]  Date: 08/10/2015  DOB: 1946/02/28  Weekly Radiation Therapy Management    ICD-9-CM ICD-10-CM   1. Primary cancer of right lower lobe of lung 162.5 C34.31     Current Dose: 66 Gy     Planned Dose:  66 Gy  Narrative . . . . . . . . The patient presents for the final under treatment assessment. The patient has had some continuation of previously noted symptoms. Denies pain. Reports a dry cough. Denies difficulty swallowing or pain associated with swallowing. Reports hyperpigmentation within treatment field without desquamation. Reports using radiaplex bid as directed. Denies fatigue. One month follow up appointment card given.                                  Set-up films were reviewed.                                 The chart was checked. Physical Findings. . . Weight essentially stable.  No significant changes. Impression . . . . . . . The patient tolerated radiation relatively well. Plan . . . . . . . . . . . . Complete radiation today as scheduled, and follow-up in one month. The patient was encouraged to call or return to the clinic in the interim for any worsening symptoms.  ________________________________  Sheral Apley Tammi Klippel, M.D.

## 2015-08-10 NOTE — Progress Notes (Signed)
Weight and vitals stable. Denies pain. Reports a dry cough. Denies difficulty swallowing or pain associated with swallowing. Reports hyperpigmentation within treatment field without desquamation. Reports using radiaplex bid as directed. Denies fatigue. One month follow up appointment card given.   BP 134/72 mmHg  Pulse 79  Resp 16  Wt 220 lb (99.791 kg) Wt Readings from Last 3 Encounters:  08/10/15 220 lb (99.791 kg)  08/04/15 218 lb 6.4 oz (99.066 kg)  07/27/15 219 lb 9.6 oz (99.61 kg)

## 2015-08-13 NOTE — Progress Notes (Signed)
  Radiation Oncology         [phone]) [phone] ________________________________  Name: John [name] MRN: [id number]  Date: 08/10/2015  DOB: 25-May-1946  End of Treatment Note    ICD-9-CM ICD-10-CM   1. 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  Narrative: The patient tolerated radiation treatment relatively well.   He denied difficulty swallowing or pain associated with swallowing. He had hyperpigmentation within treatment field without desquamation. Reported using radiaplex bid as directed.   Plan: The patient has completed radiation treatment. The patient will return to radiation oncology clinic for routine followup in one month. I advised him to call or return sooner if he has any questions or concerns related to his recovery or treatment. ________________________________  Sheral Apley. Tammi Klippel, M.D.

## 2015-08-21 NOTE — Progress Notes (Signed)
  Radiation Oncology         [phone]) [phone] ________________________________  Name: John [name] MRN: [id number]  Date: 06/19/2015  DOB: Jan 12, 1946  RESPIRATORY MOTION MANAGEMENT SIMULATION  NARRATIVE:  In order to account for effect of respiratory motion on target structures and other organs in the planning and delivery of radiotherapy, this patient underwent respiratory motion management simulation.  To accomplish this, when the patient was brought to the CT simulation planning suite, 4D respiratoy motion management CT images were obtained.  The CT images were loaded into the planning software.  Then, using a variety of tools including Cine, MIP, and standard views, the target volume and planning target volumes (PTV) were delineated.  Avoidance structures were contoured.  Treatment planning then occurred.  Dose volume histograms were generated and reviewed for each of the requested structure.  The resulting plan was carefully reviewed and approved today.

## 2015-08-27 DIAGNOSIS — J449 Chronic obstructive pulmonary disease, unspecified: Secondary | ICD-10-CM | POA: Diagnosis not present

## 2015-09-12 ENCOUNTER — Other Ambulatory Visit (HOSPITAL_COMMUNITY): Payer: Self-pay | Admitting: Cardiology

## 2015-09-12 ENCOUNTER — Other Ambulatory Visit: Payer: Self-pay | Admitting: Internal Medicine

## 2015-09-14 ENCOUNTER — Encounter: Payer: Self-pay | Admitting: Radiation Oncology

## 2015-09-14 ENCOUNTER — Ambulatory Visit
Admission: RE | Admit: 2015-09-14 | Discharge: 2015-09-14 | Disposition: A | Discharge status: Home / Self Care | Payer: Commercial Managed Care - HMO | Source: Ambulatory Visit | Attending: Radiation Oncology | Admitting: Radiation Oncology

## 2015-09-14 VITALS — BP 145/69 | HR 59 | Resp 18 | Wt 210.8 lb

## 2015-09-14 DIAGNOSIS — C3431 Malignant neoplasm of lower lobe, right bronchus or lung: Secondary | ICD-10-CM

## 2015-09-14 NOTE — Progress Notes (Signed)
Radiation Oncology         (336) [phone] ________________________________  Name: John [name] MRN: [id number]  Date: 09/14/2015  DOB: [birth year]  Follow-Up Visit Note  CC: No PCP Per Patient  Melrose Nakayama, *  Diagnosis: Primary cancer of right lower lobe of lung    ICD-9-CM ICD-10-CM   1. Primary cancer of right lower lobe of lung 162.5 C34.31 CT Chest W Contrast   Interval Since Last Radiation:  1  months 4 days [06/26/2015-08/10/2015] at 33 fractions  Narrative:  The patient returns today for routine follow-up with radiation oncology. The patients weight and vitals are currently stable. He denies symptoms of pain, cough, pain associated with swallowing, headache, dizziness, nausea, vomiting, diplopia, severe weight loss or ringing in the ears. The patient reports shortness of breath with no improvement following radiation. The patient stated that following the completion of his radiation treatments, he experienced a severe case of gout in both legs. His most vocalized symptom of concern was "my feet are swoll up together." His daughter gave him episome salt, hot water, and a "Whirlpool" to treat this at home. "I put some icey-hot on there and boom!" the patient additionally stated. These symptoms have been since alleviated. However, the skin of treatment field has returned to normal. The patient projected a healthy mental status and was not accompanied by family for today's radiation oncology appointment.                ALLERGIES:  is allergic to levaquin and lisinopril.  Meds: Current Outpatient Prescriptions  Medication Sig Dispense Refill  . albuterol (PROAIR HFA) 108 (90 BASE) MCG/ACT inhaler INHALE 2 PUFFS BY MOUTH EVERY 4 HOURS AS NEEDED FOR WHEEZING 1 Inhaler 11  . allopurinol (ZYLOPRIM) 100 MG tablet TAKE 1 TABLET BY MOUTH EVERY DAY 30 tablet 5  . amLODipine (NORVASC) 10 MG tablet TAKE 1 TABLET BY MOUTH EVERY DAY 30 tablet 5  . atorvastatin (LIPITOR) 20 MG tablet TAKE 1  TABLET BY MOUTH ONCE DAILY 90 tablet 2  . bisoprolol (ZEBETA) 5 MG tablet Take 5 mg by mouth daily.     . cloNIDine (CATAPRES) 0.1 MG tablet Take 0.1 mg by mouth 2 (two) times daily.    . colchicine 0.6 MG tablet TAKE 1 TABLET BY MOUTH EVERY DAY 30 tablet 0  . fenofibrate (TRICOR) 48 MG tablet TAKE 1 TABLET BY MOUTH EVERY DAY  NEEDS OFFICE VISIT (Patient taking differently: TAKE 1 TABLET BY MOUTH EVERY DAY  **NEEDS OFFICE VISIT) 30 tablet 5  . furosemide (LASIX) 20 MG tablet TAKE 1 TABLET BY MOUTH DAILY 30 tablet 5  . polyethylene glycol powder (GLYCOLAX/MIRALAX) powder DISSOLVE 17 GRAMS (1 CAPFUL) INTO 8 OUNCES OF LIQUID AND DRINK EVERY DAY (Patient taking differently: DISSOLVE 17 GRAMS (1 CAPFUL) INTO 8 OUNCES OF LIQUID AND DRINK EVERY DAY as needed for constipation) 527 g 11  . spironolactone (ALDACTONE) 25 MG tablet TAKE 1/2 TABLET BY MOUTH EVERY DAY   NEED OFFICE VISIT (Patient taking differently: TAKE 1/2 TABLET BY MOUTH EVERY DAY   *NEED OFFICE VISIT) 30 tablet 5  . sucralfate (CARAFATE) 1 G tablet Take 1 tablet (1 g total) by mouth 4 (four) times daily -  with meals and at bedtime. 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 SYMBICORT 160-4.5 MCG/ACT inhaler INHALE 2 PUFFS BY MOUTH EVERY 12 HOURS (FIRST THING IN THE MORNING THEN 12 HOURS LATER) 10.2 g 2  . VENTOLIN HFA  108 (90 BASE) MCG/ACT inhaler INHALE 2 PUFFS BY MOUTH EVERY 4 HOURS AS NEEDED FOR WHEEZING 18 g 2   No current facility-administered medications for this encounter.    Physical Findings: The patient is in no acute distress and is alert and oriented x3.  weight is 210 lb 12.8 oz (95.618 kg). His blood pressure is 145/69 and his pulse is 59. His respiration is 18 and oxygen saturation is 99%.  There is no significant changes in the status of the patient's overall health to be noted at this time.  Lab Findings: Lab Results  Component Value Date   WBC 7.8 07/18/2015   WBC 10.0 06/05/2015   HGB 13.6  07/18/2015   HGB 13.3 06/05/2015   HCT 42.3 07/18/2015   HCT 42.5 06/05/2015   PLT 175 07/18/2015   PLT 242 06/05/2015    Lab Results  Component Value Date   NA 143 07/18/2015   NA 140 03/15/2015   K 3.5 07/18/2015   K 3.6 03/15/2015   CHLORIDE 104 07/18/2015   CO2 29 07/18/2015   CO2 30 03/15/2015   GLUCOSE 161* 07/18/2015   GLUCOSE 129* 03/15/2015   BUN 12.3 07/18/2015   BUN 14 03/15/2015   CREATININE 1.2 07/18/2015   CREATININE 1.23 03/15/2015   BILITOT 0.52 07/18/2015   BILITOT 0.6 12/06/2012   ALKPHOS 91 07/18/2015   ALKPHOS 62 12/06/2012   AST 18 07/18/2015   AST 18 12/06/2012   ALT 20 07/18/2015   ALT 10 12/06/2012   PROT 7.5 07/18/2015   PROT 7.2 12/06/2012   ALBUMIN 3.9 07/18/2015   ALBUMIN 3.0* 12/06/2012   CALCIUM 9.4 07/18/2015   CALCIUM 9.6 03/15/2015   ANIONGAP 10 07/18/2015   ANIONGAP 15 11/03/2014    Radiographic Findings: No results found.  Impression: Heather Streeper is a 69 year old male presenting to clinic in regards to his primary cancer of right lower lobe of lung. The patient is recovering from the effects of radiation.   Plan: The patient is aware of his CT scan to take place as scheduled and the purpose of the time-frame in which he is to wait before completing this CT scan. He is advised of his follow-up appointment with radiation oncology to take place after his CT scan to review the results and any questions or concerns in detail. The patient is aware of his follow-up appointment with Dr. Julien Nordmann to take place on 11/22/2015. If the patient develops any further questions or concerns in regards to his treatment and recovery, he has been encouraged to contact Dr. Tammi Klippel, MD.  This document serves as a record of services personally performed by Tyler Pita, MD. It was created on his behalf by Lenn Cal, a trained medical scribe. The creation of this record is based on the scribe's personal observations and the provider's statements  to them. This document has been checked and approved by the attending provider. _____________________________________  Sheral Apley. Tammi Klippel, M.D.

## 2015-09-14 NOTE — Progress Notes (Signed)
Weight and vitals stable. Denies pain. Reports following completion of radiation he experience the worst case of gout in both legs ever. Denies cough. Reports shortness of breath with no improvement following radiation. Denies pain associated with swallowing. Reports skin of treatment field has returned to normal. Denies headache, dizziness, nausea, vomiting, diplopia or ringing in the ears.  BP 145/69 mmHg  Pulse 59  Resp 18  Wt 210 lb 12.8 oz (95.618 kg)  SpO2 99% Wt Readings from Last 3 Encounters:  09/14/15 210 lb 12.8 oz (95.618 kg)  08/10/15 220 lb (99.791 kg)  08/04/15 218 lb 6.4 oz (99.066 kg)

## 2015-09-27 DIAGNOSIS — J449 Chronic obstructive pulmonary disease, unspecified: Secondary | ICD-10-CM | POA: Diagnosis not present

## 2015-09-28 ENCOUNTER — Other Ambulatory Visit: Payer: Self-pay | Admitting: Oncology

## 2015-09-28 ENCOUNTER — Ambulatory Visit
Admission: RE | Admit: 2015-09-28 | Discharge: 2015-09-28 | Disposition: A | Discharge status: Home / Self Care | Payer: Commercial Managed Care - HMO | Source: Ambulatory Visit | Attending: Radiation Oncology | Admitting: Radiation Oncology

## 2015-09-28 ENCOUNTER — Ambulatory Visit (HOSPITAL_COMMUNITY)
Admission: RE | Admit: 2015-09-28 | Discharge: 2015-09-28 | Disposition: A | Discharge status: Home / Self Care | Payer: Commercial Managed Care - HMO | Source: Ambulatory Visit | Attending: Radiation Oncology | Admitting: Radiation Oncology

## 2015-09-28 DIAGNOSIS — J439 Emphysema, unspecified: Secondary | ICD-10-CM | POA: Insufficient documentation

## 2015-09-28 DIAGNOSIS — N200 Calculus of kidney: Secondary | ICD-10-CM | POA: Diagnosis not present

## 2015-09-28 DIAGNOSIS — C3431 Malignant neoplasm of lower lobe, right bronchus or lung: Secondary | ICD-10-CM | POA: Diagnosis not present

## 2015-09-28 DIAGNOSIS — I2510 Atherosclerotic heart disease of native coronary artery without angina pectoris: Secondary | ICD-10-CM | POA: Diagnosis not present

## 2015-09-28 DIAGNOSIS — K8020 Calculus of gallbladder without cholecystitis without obstruction: Secondary | ICD-10-CM | POA: Insufficient documentation

## 2015-09-28 LAB — BUN AND CREATININE (CC13)
BUN: 14.5 mg/dL (ref 7.0–26.0)
Creatinine: 1.3 mg/dL (ref 0.7–1.3)
EGFR: 65 mL/min/{1.73_m2} — AB (ref >90)

## 2015-09-28 MED ORDER — IOHEXOL 300 MG/ML  SOLN
75.0000 mL | Freq: Once | INTRAMUSCULAR | Status: AC | PRN
  Administered 2015-09-28: 75 mL via INTRAVENOUS

## 2015-10-02 ENCOUNTER — Encounter: Payer: Self-pay | Admitting: Radiation Oncology

## 2015-10-02 ENCOUNTER — Ambulatory Visit
Admission: RE | Admit: 2015-10-02 | Discharge: 2015-10-02 | Disposition: A | Discharge status: Home / Self Care | Payer: Commercial Managed Care - HMO | Source: Ambulatory Visit | Attending: Radiation Oncology | Admitting: Radiation Oncology

## 2015-10-02 VITALS — BP 112/74 | HR 68 | Resp 16 | Wt 215.3 lb

## 2015-10-02 DIAGNOSIS — C3431 Malignant neoplasm of lower lobe, right bronchus or lung: Secondary | ICD-10-CM

## 2015-10-02 NOTE — Progress Notes (Signed)
Radiation Oncology         (336) [phone] ________________________________  Name: John [name] MRN: [id number]  Date: 10/02/2015  DOB: July 03, 1946    Follow-Up Visit Note  CC: No PCP Per Patient  Melrose Nakayama, *  Diagnosis: Primary cancer of right lower lobe of lung    ICD-9-CM ICD-10-CM   1. Primary cancer of right lower lobe of lung ([facility]) 162.5 C34.31    Interval Since Last Radiation:  1  months 3 weeks [06/26/2015-08/10/2015] at 33 fractions  Narrative:  The patient returns today for routine follow-up with radiation oncology. Weight and vitals stable. Bilateral lower extremity edema has resolved. Denies cough. SOB with mild exertion noted. Denies difficulty swallowing. Reports mild fatigue. Denies pain. Patient requesting a letter be written to excuse him from jury duty.               ALLERGIES:  is allergic to levaquin and lisinopril.  Meds: Current Outpatient Prescriptions  Medication Sig Dispense Refill  . albuterol (PROAIR HFA) 108 (90 BASE) MCG/ACT inhaler INHALE 2 PUFFS BY MOUTH EVERY 4 HOURS AS NEEDED FOR WHEEZING 1 Inhaler 11  . allopurinol (ZYLOPRIM) 100 MG tablet TAKE 1 TABLET BY MOUTH EVERY DAY 30 tablet 5  . amLODipine (NORVASC) 10 MG tablet TAKE 1 TABLET BY MOUTH EVERY DAY 30 tablet 5  . atorvastatin (LIPITOR) 20 MG tablet TAKE 1 TABLET BY MOUTH ONCE DAILY 90 tablet 2  . bisoprolol (ZEBETA) 5 MG tablet Take 5 mg by mouth daily.     . cloNIDine (CATAPRES) 0.1 MG tablet Take 0.1 mg by mouth 2 (two) times daily.    . colchicine 0.6 MG tablet TAKE 1 TABLET BY MOUTH EVERY DAY 30 tablet 0  . fenofibrate (TRICOR) 48 MG tablet TAKE 1 TABLET BY MOUTH EVERY DAY  NEEDS OFFICE VISIT (Patient taking differently: TAKE 1 TABLET BY MOUTH EVERY DAY  **NEEDS OFFICE VISIT) 30 tablet 5  . furosemide (LASIX) 20 MG tablet TAKE 1 TABLET BY MOUTH DAILY 30 tablet 5  . polyethylene glycol powder (GLYCOLAX/MIRALAX) powder DISSOLVE 17 GRAMS (1 CAPFUL) INTO 8 OUNCES OF LIQUID AND DRINK  EVERY DAY (Patient taking differently: DISSOLVE 17 GRAMS (1 CAPFUL) INTO 8 OUNCES OF LIQUID AND DRINK EVERY DAY as needed for constipation) 527 g 11  . spironolactone (ALDACTONE) 25 MG tablet TAKE 1/2 TABLET BY MOUTH EVERY DAY   NEED OFFICE VISIT (Patient taking differently: TAKE 1/2 TABLET BY MOUTH EVERY DAY   *NEED OFFICE VISIT) 30 tablet 5  . sucralfate (CARAFATE) 1 G tablet Take 1 tablet (1 g total) by mouth 4 (four) times daily -  with meals and at bedtime. 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 SYMBICORT 160-4.5 MCG/ACT inhaler INHALE 2 PUFFS BY MOUTH EVERY 12 HOURS (FIRST THING IN THE MORNING THEN 12 HOURS LATER) 10.2 g 2  . VENTOLIN HFA 108 (90 BASE) MCG/ACT inhaler INHALE 2 PUFFS BY MOUTH EVERY 4 HOURS AS NEEDED FOR WHEEZING 18 g 2   No current facility-administered medications for this encounter.    Physical Findings: The patient is in no acute distress and is alert and oriented x3.  weight is 215 lb 4.8 oz (97.659 kg). His blood pressure is 112/74 and his pulse is 68. His respiration is 16 and oxygen saturation is 98%.  There is no significant changes in the status of the patient's overall health to be noted at this time.   Lab Findings: Lab Results  Component Value Date   WBC 7.8 07/18/2015   WBC 10.0 06/05/2015   HGB 13.6 07/18/2015   HGB 13.3 06/05/2015   HCT 42.3 07/18/2015   HCT 42.5 06/05/2015   PLT 175 07/18/2015   PLT 242 06/05/2015    Lab Results  Component Value Date   NA 143 07/18/2015   NA 140 03/15/2015   K 3.5 07/18/2015   K 3.6 03/15/2015   CHLORIDE 104 07/18/2015   CO2 29 07/18/2015   CO2 30 03/15/2015   GLUCOSE 161* 07/18/2015   GLUCOSE 129* 03/15/2015   BUN 14.5 09/28/2015   BUN 14 03/15/2015   CREATININE 1.3 09/28/2015   CREATININE 1.23 03/15/2015   BILITOT 0.52 07/18/2015   BILITOT 0.6 12/06/2012   ALKPHOS 91 07/18/2015   ALKPHOS 62 12/06/2012   AST 18 07/18/2015   AST 18 12/06/2012   ALT 20 07/18/2015   ALT  10 12/06/2012   PROT 7.5 07/18/2015   PROT 7.2 12/06/2012   ALBUMIN 3.9 07/18/2015   ALBUMIN 3.0* 12/06/2012   CALCIUM 9.4 07/18/2015   CALCIUM 9.6 03/15/2015   ANIONGAP 10 07/18/2015   ANIONGAP 15 11/03/2014    Radiographic Findings: Ct Chest W Contrast  09/28/2015   CLINICAL DATA:  Squamous cell lung carcinoma of the right lower lobe status post chemotherapy and ongoing radiation therapy. Restaging.  EXAM: CT CHEST WITH CONTRAST  TECHNIQUE: Multidetector CT imaging of the chest was performed during intravenous contrast administration.  CONTRAST:  27m OMNIPAQUE IOHEXOL 300 MG/ML  SOLN  COMPARISON:  04/13/2015 PET-CT.  03/27/2015 chest CT.  FINDINGS: Mediastinum/Nodes: Normal heart size. No pericardial fluid/thickening. There is atherosclerosis of the thoracic aorta, the great vessels of the mediastinum and the coronary arteries, including calcified atherosclerotic plaque in the left anterior descending, left circumflex and right coronary arteries. Atherosclerotic nonaneurysmal thoracic aorta. Dilated main pulmonary artery (3.6 cm diameter). No central pulmonary emboli. Normal visualized thyroid. Normal esophagus. No axillary lymphadenopathy. No pathologically enlarged mediastinal or hilar lymph nodes. The previously described 1.1 cm right anterior upper mediastinal hypermetabolic node measures 0.8 cm, decreased.  Lungs/Pleura: No pneumothorax. No pleural effusion. No acute consolidative airspace disease. Mild centrilobular emphysema. Right upper lobe 9 mm sub solid pulmonary nodule is stable since 04/25/2010 chest CT (using similar measurement technique), in keeping with benign etiology. Anterior right middle lobe 4 mm solid pulmonary nodule (series 5/ image 31) is stable since 04/13/2015. The superior segment right lower lobe 2.1 x 1.4 cm pulmonary nodule (5/26) is decreased from 2.4 x 2.0 cm on 04/13/2015 using similar measurement technique. Focal reticular opacities and distortion in the peripheral  basilar right lower lobe is unchanged since 2011, likely benign postinfectious/postinflammatory scarring. Basilar left lower lobe 4 mm solid pulmonary nodule (5/49) is stable. No new significant pulmonary nodules.  Upper abdomen: Cholelithiasis. No evidence of acute cholecystitis. Simple 4.9 cm lateral upper right renal cyst. Nonobstructing 8 mm stone in the right upper kidney.  Musculoskeletal: No aggressive appearing focal osseous lesions. Marked degenerative changes in the thoracic spine. Stable healed deformity in the right posterior eighth rib.  IMPRESSION: 1. Partial treatment response in the superior segment right lower lobe malignancy. 2. No residual thoracic lymphadenopathy (decreased size of the hypermetabolic anterior right upper mediastinal lymph node described on the 04/13/2015 PET-CT study). 3. No new sites of metastatic disease. Previously described subcentimeter pulmonary nodules in both lungs are stable. 4. Mild centrilobular emphysema. 5.  Atherosclerosis, including three-vessel coronary artery disease. 6. Cholelithiasis. 7. Nonobstructing right renal stone.  Electronically Signed   By: Ilona Sorrel M.D.   On: 09/28/2015 10:31    Impression: John [name] is a 69 year old male presenting to clinic in regards to his primary cancer of right lower lobe of lung. The patient is recovering from the effects of radiation. His CT shows some decrease in the size of the primary tumor and resolution of adenopathy.  Plan: The patient is scheduled to follow up with Dr. Julien Nordmann on 11/22/15. I have given the patient a letter to excuse him from upcoming jury duty. Follow up in 3 months.  This document serves as a record of services personally performed by Tyler Pita, MD. It was created on his behalf by Arlyce Harman, a trained medical scribe. The creation of this record is based on the scribe's personal observations and the provider's statements to them. This document has been checked and approved  by the attending provider.    _____________________________________  Sheral Apley. Tammi Klippel, M.D.

## 2015-10-02 NOTE — Progress Notes (Signed)
Weight and vitals stable. Bilateral lower extremity edema has resolved. Denies cough. SOB with mild exertion noted. Denies difficulty swallowing. Reports mild fatigue. Denies pain. Patient requesting a letter be written to excuse him from jury duty.   BP 112/74 mmHg  Pulse 68  Resp 16  Wt 215 lb 4.8 oz (97.659 kg)  SpO2 98% Wt Readings from Last 3 Encounters:  10/02/15 215 lb 4.8 oz (97.659 kg)  09/14/15 210 lb 12.8 oz (95.618 kg)  08/10/15 220 lb (99.791 kg)

## 2015-10-17 ENCOUNTER — Other Ambulatory Visit: Payer: Self-pay | Admitting: Internal Medicine

## 2015-10-18 ENCOUNTER — Telehealth: Payer: Self-pay | Admitting: Internal Medicine

## 2015-10-18 NOTE — Telephone Encounter (Signed)
We received multiple rf requests and these were denied since pt has not followed up here since June 2016  At that time he was scheduled for med cal with TP and did not present with med cal or any of his meds  Looks like he does not have a PCP still? Per MW- needs appt with TP for med cal and BRING ALL MEDS TO APPT

## 2015-10-27 DIAGNOSIS — J449 Chronic obstructive pulmonary disease, unspecified: Secondary | ICD-10-CM | POA: Diagnosis not present

## 2015-11-13 ENCOUNTER — Other Ambulatory Visit (HOSPITAL_COMMUNITY): Payer: Self-pay | Admitting: Internal Medicine

## 2015-11-13 ENCOUNTER — Telehealth: Payer: Self-pay | Admitting: Internal Medicine

## 2015-11-13 MED ORDER — ALLOPURINOL 100 MG PO TABS
100.0000 mg | ORAL_TABLET | Freq: Every day | ORAL | Status: DC
Start: 2015-11-13 — End: 2015-11-15

## 2015-11-13 MED ORDER — COLCHICINE 0.6 MG PO TABS: 0.6000 mg | ORAL_TABLET | Freq: Every day | ORAL | Status: DC

## 2015-11-13 MED ORDER — ATORVASTATIN CALCIUM 20 MG PO TABS: 20.0000 mg | ORAL_TABLET | Freq: Every day | ORAL | Status: DC

## 2015-11-13 MED ORDER — FENOFIBRATE 48 MG PO TABS: ORAL_TABLET | ORAL | Status: DC

## 2015-11-13 MED ORDER — AMLODIPINE BESYLATE 10 MG PO TABS: 10.0000 mg | ORAL_TABLET | Freq: Every day | ORAL | Status: DC

## 2015-11-13 MED ORDER — BISOPROLOL FUMARATE 5 MG PO TABS: 5.0000 mg | ORAL_TABLET | Freq: Every day | ORAL | Status: DC

## 2015-11-13 MED ORDER — BUDESONIDE-FORMOTEROL FUMARATE 160-4.5 MCG/ACT IN AERO: INHALATION_SPRAY | RESPIRATORY_TRACT | Status: DC

## 2015-11-13 MED ORDER — ALBUTEROL SULFATE HFA 108 (90 BASE) MCG/ACT IN AERS: INHALATION_SPRAY | RESPIRATORY_TRACT | Status: DC

## 2015-11-13 NOTE — Telephone Encounter (Signed)
Rx sent to pharmacy. Patient's daughter notified via voicemail that Rx has been sent.  Nothing further needed. Closing encounter

## 2015-11-13 NOTE — Telephone Encounter (Signed)
Received call from patient's daughter, Willette Cluster.  She says that the pharmacy faxed over refill requests for patient's medications.  She is not sure what medications he needed filled.  Shubert to inquire about medication refill request:  Allopurinol '100mg'$ ; Norvasc '10mg'$ ; Bisoprolol '5mg'$ ; Colchicine 0.'6mg'$ ; Fenofibrate '48mg'$ ; Symbicort 160, Ventolin HFA.  Patient was last seen by TP on 06/14/15; next OV with Dr. Melvyn Novas is 12/01/15.  Dr. Melvyn Novas, please advise if okay to refill all of these meds. Thanks.

## 2015-11-13 NOTE — Telephone Encounter (Signed)
If they are on our list ok to refill

## 2015-11-15 ENCOUNTER — Other Ambulatory Visit: Payer: Self-pay | Admitting: Internal Medicine

## 2015-11-15 ENCOUNTER — Encounter (HOSPITAL_COMMUNITY): Payer: Self-pay

## 2015-11-15 ENCOUNTER — Other Ambulatory Visit: Payer: Commercial Managed Care - HMO

## 2015-11-15 ENCOUNTER — Other Ambulatory Visit (HOSPITAL_BASED_OUTPATIENT_CLINIC_OR_DEPARTMENT_OTHER): Payer: Commercial Managed Care - HMO

## 2015-11-15 ENCOUNTER — Ambulatory Visit (HOSPITAL_COMMUNITY)
Admission: RE | Admit: 2015-11-15 | Discharge: 2015-11-15 | Disposition: A | Discharge status: Home / Self Care | Payer: Commercial Managed Care - HMO | Source: Ambulatory Visit | Attending: Internal Medicine | Admitting: Internal Medicine

## 2015-11-15 DIAGNOSIS — R911 Solitary pulmonary nodule: Secondary | ICD-10-CM | POA: Insufficient documentation

## 2015-11-15 DIAGNOSIS — J449 Chronic obstructive pulmonary disease, unspecified: Secondary | ICD-10-CM | POA: Insufficient documentation

## 2015-11-15 DIAGNOSIS — C3431 Malignant neoplasm of lower lobe, right bronchus or lung: Secondary | ICD-10-CM | POA: Diagnosis not present

## 2015-11-15 DIAGNOSIS — C3490 Malignant neoplasm of unspecified part of unspecified bronchus or lung: Secondary | ICD-10-CM | POA: Diagnosis not present

## 2015-11-15 DIAGNOSIS — J441 Chronic obstructive pulmonary disease with (acute) exacerbation: Secondary | ICD-10-CM

## 2015-11-15 LAB — COMPREHENSIVE METABOLIC PANEL (CC13)
ALBUMIN: 3.6 g/dL (ref 3.5–5.0)
ALK PHOS: 88 U/L (ref 40–150)
ALT: 17 U/L (ref 0–55)
AST: 22 U/L (ref 5–34)
Anion Gap: 10 mEq/L (ref 3–11)
BILIRUBIN TOTAL: 0.42 mg/dL (ref 0.20–1.20)
BUN: 11.5 mg/dL (ref 7.0–26.0)
CO2: 28 meq/L (ref 22–29)
Calcium: 9.5 mg/dL (ref 8.4–10.4)
Chloride: 105 mEq/L (ref 98–109)
Creatinine: 1.2 mg/dL (ref 0.7–1.3)
EGFR: 71 mL/min/{1.73_m2} — AB (ref >90)
GLUCOSE: 137 mg/dL (ref 70–140)
POTASSIUM: 3.5 meq/L (ref 3.5–5.1)
SODIUM: 143 meq/L (ref 136–145)
TOTAL PROTEIN: 7.8 g/dL (ref 6.4–8.3)

## 2015-11-15 MED ORDER — IOHEXOL 300 MG/ML  SOLN
75.0000 mL | Freq: Once | INTRAMUSCULAR | Status: AC | PRN
  Administered 2015-11-15: 75 mL via INTRAVENOUS

## 2015-11-16 NOTE — Telephone Encounter (Signed)
Dr Melvyn Novas- I went to refill his meds (he made appt for 12/01/15), but there are all sorts of warnings popping up can you please review (just go to meds/orders) thanks

## 2015-11-22 ENCOUNTER — Ambulatory Visit: Payer: Commercial Managed Care - HMO | Admitting: Internal Medicine

## 2015-11-27 DIAGNOSIS — J449 Chronic obstructive pulmonary disease, unspecified: Secondary | ICD-10-CM | POA: Diagnosis not present

## 2015-11-29 ENCOUNTER — Telehealth: Payer: Self-pay | Admitting: Internal Medicine

## 2015-11-29 NOTE — Telephone Encounter (Signed)
SPOKE WITH PATIENT RE NEW APPOINTMENT FOR LAB/FU 12/27. [facility] CALLED ON BEHALF OF PT TO CX AND R/S 11/23 APPOINTMENT. ALSO MAILED SCHEDULE AND PATIENT AWARE HE WILL RECEIVE A REMINDER CALL 2 DAYS PRIOR TO APPOINTMENT.

## 2015-12-01 ENCOUNTER — Encounter: Payer: Self-pay | Admitting: Internal Medicine

## 2015-12-01 ENCOUNTER — Ambulatory Visit (INDEPENDENT_AMBULATORY_CARE_PROVIDER_SITE_OTHER): Payer: Commercial Managed Care - HMO | Admitting: Internal Medicine

## 2015-12-01 VITALS — BP 122/82 | HR 69 | Ht 67.0 in | Wt 220.0 lb

## 2015-12-01 DIAGNOSIS — J449 Chronic obstructive pulmonary disease, unspecified: Secondary | ICD-10-CM | POA: Diagnosis not present

## 2015-12-01 DIAGNOSIS — J9611 Chronic respiratory failure with hypoxia: Secondary | ICD-10-CM | POA: Diagnosis not present

## 2015-12-01 NOTE — Progress Notes (Signed)
Subjective:    Patient ID: John [name], male    DOB: 1946-02-21   MRN: [id number]  Brief patient profile:  68 yobm quit smoking 09/2012 with GOLD II copd 01/2013 and  with multiple admits for "aecopd" last one:   Admit date: 12/06/2012  Discharge date: 12/08/2012   Discharge Diagnoses:  COPD exacerbation  HTN (hypertension)  Allergic drug rash  Drug-induced hyperglycemia  CHF exacerbation  Hyperglycemia induced by steroids  CAD   History of Present Illness  12/18/2012 1st pulmonary ov/ Wert cc doe s 02 up to half a mile s stopping and overall using 02 mostly evening before bed on advair 500/50 q am only and does not know how to use it correctly. Not needing any daytime saba but generally confused on how to use meds.  >change to Advair 250/50     03/01/2013 f/u ov/Wert cc confused with meds/ med calendar using advair 500 and confused with how to use hfa albuterol but overall doing better rec Advair 250/50 one twice daily = Plan A Only use your albuterol (ventolin = Plan B)   Please see patient coordinator before you leave today  to schedule portable 02      09/15/2013 f/u ov/Wert re: copd/02 dep Chief Complaint  Patient presents with  . Follow-up    Pt states his breathing is unchanged since the last visit. No new co's today. Usinf albuterol HFA approx 3 times per wk.  not really limited from desired activities rec Stop carvedilol (coreg) and substitute with bisoprolol 5 mg  One daily  Continue symbicort 160 Take 2 puffs first thing in am and then another 2 puffs about 12 hours later Remember don't leave home without your rescue inhaler (ventolin) Rec:  02 2lpm sleeping and walking outside the house     03/13/2015 f/u ov/Wert re: copd GOLD II/ confused with meds / no med calendar  Chief Complaint  Patient presents with  . Follow-up    Pt states that his breathing is unchanged since his last visit.   avg use rescue only once or twice daily esp a problem with bending  over or if gets in a big hurry but ok with slow paced flat walking/ adls  Using 02 just at hs , not with ambulation  Has saba and respiclick and not sure which one to use but likes hfa better  rec symbicort 160 Take 2 puffs first thing in am and then another 2 puffs about 12 hours later.  Only use your albuterol (proair) as a rescue medication  Please remember to go to the  x-ray department  > Pos SPN > PET pos.     04/20/2015 f/u ov/Wert re: copd GOLD II/ spn RLL with ? Pos node ant med Not using symbicort 160 consistently  / no sob slow and flat despite missing meds  >>refer to [facility]  06/14/2015 Follow up : COPD GOLD II /+Lung Cancer -Squamous cell  Pt returns for 2 month follow up .  Pt with with RLL nodule 2.1cm positive on PET scan .  Underwent CT guided bx on 6/8 that was positive squamous cell carcinoma.  Has seen [facility] for surgical consult . Pt with multiple chronic medical problems including  CAD and pulmonary HTN . Last FEV1 46%. Will need new full PFT to proceed for consideration  Of wedge resection .also would need cardiology consult with Right and left heart cath   He has also been sent to Radiation Oncology this week and Oncology  Next with DR. Mohamed.  rec Continue on Symbicort 2 puffs Twice daily   Biopsy results were positive for Lung cancer Squamous Cell Carcinoma .  You have an appointment with Dr. Julien Nordmann next week on 06/21/15 .     12/01/2015  f/u ov/Wert re: copd II maint rx symb 160 bid  Chief Complaint  Patient presents with  . Follow-up    Pt states that he is doing well. Pt denies cough/wheeze/SOB/CP/tightness.      Over using saba at baseline but otherwise doing well/ still not really understanding how to use prns  Doe x [facility] = can't walk a nl pace on a flat grade s sob                           Objective:   Physical Exam  03/19/2013  Wt 208 vs 06/17/2013 209  09/15/2013  206 > 212 01/10/2014 > 07/11/2014  210  > 10/10/2014 209 >210  01/11/2015 > 03/13/2015 212 > 04/20/2015  213>219 06/14/2015 > 12/01/2015   220    amb bm nad  HEENT mild turbinate edema.  Edentulous,  Oropharynx no thrush or excess pnd or cobblestoning. Edentulous with dentures in place No JVD or cervical adenopathy. Mild accessory muscle hypertrophy. Trachea midline, nl thryroid. Chest was hyperinflated by percussion with diminished breath sounds and moderate increased exp time without wheeze.  Regular rate and rhythm without murmur gallop or rub or increase P2 or edema.  Abd: quite protuberant/ no hsm, nl excursion. Ext warm without cyanosis or clubbing.               Assessment & Plan:

## 2015-12-01 NOTE — Patient Instructions (Addendum)
Work on Doctor, hospital technique:  relax and gently blow all the way out then take a nice smooth deep breath back in, triggering the inhaler at same time you start breathing in.  Hold for up to 5 seconds if you can. Blow out thru nose. Rinse and gargle with water when done   Only use your albuterol(Proair)  as a rescue medication to be used if you can't catch your breath by resting or doing a relaxed purse lip breathing pattern.  - The less you use it, the better it will work when you need it. - Ok to use up to 2 puffs  every 4 hours if you must but call for   appointment if use goes up over your usual need - Don't leave home without it !!  (think of it like the spare tire for your car)   Only use the albuterol neb if you try your proair first and it doesn't work > call for immediate appointment if need for this goes up   Please schedule a follow up visit in 6 months but call sooner if needed  Add consider adding lama/ do walking sats next ov ? Use of 02 other than hs

## 2015-12-03 DIAGNOSIS — E669 Obesity, unspecified: Secondary | ICD-10-CM | POA: Insufficient documentation

## 2015-12-03 NOTE — Assessment & Plan Note (Addendum)
-   PFT's 02/05/13 FEV1  1.46  (50%) ratio 55 p 22% better p B2,  DLCO  61 corrects to 81% -  04/20/2015 p extensive coaching HFA effectiveness =      90%  -  04/20/2015  Walked RA x 3 laps @ 185 ft each stopped due to  End study, no sob, nl pace  - Spirometry 04/20/2015 >  FEV1  1.21 (48%) ratio 58 before am rx   Ok on rx though over using saba. Could add lama next if decline in ex tol but he has limited funds/insight into how to use his meds as it stands now  - The proper method of use, as well as anticipated side effects, of a metered-dose inhaler are discussed and demonstrated to the patient. Improved effectiveness after extensive coaching during this visit to a level of approximately 90 % from a baseline of 75%   I had an extended discussion with the patient reviewing all relevant studies completed to date and  lasting 15 to 20 minutes of a 25 minute visit    Each maintenance medication was reviewed in detail including most importantly the difference between maintenance and prns and under what circumstances the prns are to be triggered using an action plan format that is not reflected in the computer generated alphabetically organized AVS.    Please see instructions for details which were reviewed in writing and the patient given a copy highlighting the part that I personally wrote and discussed at today's ov.

## 2015-12-03 NOTE — Assessment & Plan Note (Signed)
Complicated by hbp/ hyperlipidemia   Body mass index is 34.45  Lab Results  Component Value Date   TSH 1.241 02/22/2012     Contributing to gerd tendency/ doe/reviewed the need and the process to achieve and maintain neg calorie balance > defer f/u primary care including intermittently monitoring thyroid status

## 2015-12-03 NOTE — Assessment & Plan Note (Signed)
-   12/18/2012  Walked RA  2 laps @ 185 ft each stopped due to  desat 87% - 03/19/2013  Walked 2lpm x 3 laps @ 185 ft each stopped due to  End of study, sat 91% - 06/17/2013   Walked RA x one lap @ 185 stopped due to desat to 87 when started second lap -09/15/2013   Walked RA x one lap @ 185 stopped due to sob, no desat  - 04/20/2015  Walked RA x 3 laps @ 185 ft each stopped due to  End of study, no pace, no desat   Rec:  As of 12/01/2015  02 2lpm sleeping  Only

## 2015-12-11 ENCOUNTER — Other Ambulatory Visit: Payer: Self-pay | Admitting: Internal Medicine

## 2015-12-13 ENCOUNTER — Other Ambulatory Visit: Payer: Self-pay | Admitting: Internal Medicine

## 2015-12-22 ENCOUNTER — Other Ambulatory Visit: Payer: Self-pay | Admitting: Medical Oncology

## 2015-12-22 DIAGNOSIS — C3431 Malignant neoplasm of lower lobe, right bronchus or lung: Secondary | ICD-10-CM

## 2015-12-26 ENCOUNTER — Other Ambulatory Visit (HOSPITAL_BASED_OUTPATIENT_CLINIC_OR_DEPARTMENT_OTHER): Payer: Commercial Managed Care - HMO

## 2015-12-26 ENCOUNTER — Ambulatory Visit (HOSPITAL_BASED_OUTPATIENT_CLINIC_OR_DEPARTMENT_OTHER): Payer: Commercial Managed Care - HMO | Admitting: Internal Medicine

## 2015-12-26 ENCOUNTER — Encounter: Payer: Self-pay | Admitting: Internal Medicine

## 2015-12-26 VITALS — BP 136/65 | HR 79 | Temp 98.6°F | Resp 18 | Ht 67.0 in | Wt 222.6 lb

## 2015-12-26 DIAGNOSIS — C3431 Malignant neoplasm of lower lobe, right bronchus or lung: Secondary | ICD-10-CM

## 2015-12-26 LAB — CBC WITH DIFFERENTIAL/PLATELET
BASO%: 0.8 % (ref 0.0–2.0)
BASOS ABS: 0.1 10*3/uL (ref 0.0–0.1)
EOS ABS: 0.2 10*3/uL (ref 0.0–0.5)
EOS%: 2.4 % (ref 0.0–7.0)
HCT: 41.1 % (ref 38.4–49.9)
HGB: 13.4 g/dL (ref 13.0–17.1)
LYMPH%: 11.9 % — AB (ref 14.0–49.0)
MCH: 31.9 pg (ref 27.2–33.4)
MCHC: 32.6 g/dL (ref 32.0–36.0)
MCV: 97.8 fL (ref 79.3–98.0)
MONO#: 0.9 10*3/uL (ref 0.1–0.9)
MONO%: 10.5 % (ref 0.0–14.0)
NEUT%: 74.4 % (ref 39.0–75.0)
NEUTROS ABS: 6.5 10*3/uL (ref 1.5–6.5)
PLATELETS: 224 10*3/uL (ref 140–400)
RBC: 4.2 10*6/uL (ref 4.20–5.82)
RDW: 14.1 % (ref 11.0–14.6)
WBC: 8.7 10*3/uL (ref 4.0–10.3)
lymph#: 1 10*3/uL (ref 0.9–3.3)

## 2015-12-26 LAB — COMPREHENSIVE METABOLIC PANEL
ALBUMIN: 4 g/dL (ref 3.5–5.0)
ALK PHOS: 81 U/L (ref 40–150)
ALT: 13 U/L (ref 0–55)
ANION GAP: 10 meq/L (ref 3–11)
AST: 16 U/L (ref 5–34)
BILIRUBIN TOTAL: 0.5 mg/dL (ref 0.20–1.20)
BUN: 17.6 mg/dL (ref 7.0–26.0)
CO2: 27 meq/L (ref 22–29)
Calcium: 9.5 mg/dL (ref 8.4–10.4)
Chloride: 104 mEq/L (ref 98–109)
Creatinine: 1.5 mg/dL — ABNORMAL HIGH (ref 0.7–1.3)
EGFR: 52 mL/min/{1.73_m2} — AB (ref >90)
Glucose: 117 mg/dl (ref 70–140)
POTASSIUM: 5 meq/L (ref 3.5–5.1)
Sodium: 141 mEq/L (ref 136–145)
TOTAL PROTEIN: 8 g/dL (ref 6.4–8.3)

## 2015-12-26 NOTE — Progress Notes (Signed)
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  PRIOR THERAPY: Course of curative radiotherapy to the right lower lobe lung nodule as well as mediastinal lymphadenopathy under the care of Dr. Tammi Klippel.  CURRENT THERAPY: Observation.  INTERVAL HISTORY: John [name] 69 y.o. male returns to the clinic today for 4 month follow-up visit. The patient completed a course of curative radiotherapy under the care of Dr. Tammi Klippel and tolerated it fairly well. He denied having any significant chest pain or shortness of breath but has mild cough with no hemoptysis. He has no significant weight loss or night sweats. The patient denied having any nausea or vomiting, no fever or chills. He had repeat CT scan of the chest performed recently and he is here for evaluation and discussion of his scan results.  MEDICAL HISTORY: Past Medical History  Diagnosis Date  . CHF (congestive heart failure) ([place])   . MI (myocardial infarction) ([place])   . COPD (chronic obstructive pulmonary disease) ([place])   . Hypertension   . Gout   . Hyperlipemia   . Pulmonary hypertension ([place])   . Lung cancer ([place])     squamous cell carcinoma of right lower lobe lung    ALLERGIES:  is allergic to levaquin and lisinopril.  MEDICATIONS:  Current Outpatient Prescriptions  Medication Sig Dispense Refill  . albuterol (PROAIR HFA) 108 (90 BASE) MCG/ACT inhaler INHALE 2 PUFFS BY MOUTH EVERY 4 HOURS AS NEEDED FOR WHEEZING 1 Inhaler 11  . allopurinol (ZYLOPRIM) 100 MG tablet TAKE 1 TABLET BY MOUTH EVERY DAY 30 tablet 0  . amLODipine (NORVASC) 10 MG tablet TAKE 1 TABLET BY MOUTH EVERY DAY 30 tablet 0  . atorvastatin (LIPITOR) 20 MG tablet Take 1 tablet (20 mg total) by mouth daily. 30 tablet 0  . bisoprolol (ZEBETA) 5 MG tablet TAKE 1  TABLET BY MOUTH EVERY DAY 30 tablet 0  . cloNIDine (CATAPRES) 0.1 MG tablet TAKE 1 TABLET BY MOUTH TWICE DAILY 60 tablet 2  . colchicine 0.6 MG tablet TAKE 1 TABLET BY MOUTH EVERY DAY *NEED TO MAKE APPOINTMENT* 30 tablet 0  . fenofibrate (TRICOR) 48 MG tablet TAKE 1 TABLET BY MOUTH EVERY DAY 30 tablet 0  . furosemide (LASIX) 20 MG tablet TAKE 1 TABLET BY MOUTH DAILY 30 tablet 2  . polyethylene glycol powder (GLYCOLAX/MIRALAX) powder DISSOLVE 17 GRAMS (1 CAPFUL) INTO 8 OUNCES OF LIQUID AND DRINK EVERY DAY (Patient taking differently: DISSOLVE 17 GRAMS (1 CAPFUL) INTO 8 OUNCES OF LIQUID AND DRINK EVERY DAY as needed for constipation) 527 g 11  . sucralfate (CARAFATE) 1 G tablet Take 1 tablet (1 g total) by mouth 4 (four) times daily -  with meals and at bedtime. 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 SYMBICORT 160-4.5 MCG/ACT inhaler INHALE 2 PUFFS BY MOUTH EVERY 12 HOURS (FIRST THING IN THE MORNING THEN 12 HOURS LATER) 1 Inhaler 5  . VENTOLIN HFA 108 (90 BASE) MCG/ACT inhaler INHALE 2 PUFFS BY MOUTH EVERY 4 HOURS AS NEEDED FOR WHEEZING 1 Inhaler 5   No current facility-administered medications for this visit.    SURGICAL HISTORY:  Past Surgical History  Procedure Laterality Date  . Appendectomy    . Lung biopsy      REVIEW OF SYSTEMS:  A comprehensive review of systems was negative except for: Respiratory: positive for cough   PHYSICAL EXAMINATION: General appearance: alert, cooperative and no distress Head: Normocephalic, without obvious abnormality, atraumatic Neck: no adenopathy, no JVD, supple, symmetrical, trachea midline and thyroid not enlarged, symmetric, no tenderness/mass/nodules Lymph nodes: Cervical, supraclavicular, and axillary nodes normal. Resp: clear to auscultation bilaterally Back: symmetric, no curvature. ROM normal. No CVA tenderness. Cardio: regular rate and rhythm, S1, S2 normal, no murmur, click, rub or gallop GI: soft, non-tender;  bowel sounds normal; no masses,  no organomegaly Extremities: extremities normal, atraumatic, no cyanosis or edema  ECOG PERFORMANCE STATUS: 1 - Symptomatic but completely ambulatory  Blood pressure 136/65, pulse 79, temperature 98.6 F (37 C), temperature source Oral, resp. rate 18, height '5\' 7"'$  (1.702 m), weight 222 lb 9.6 oz (100.971 kg), SpO2 95 %.  LABORATORY DATA: Lab Results  Component Value Date   WBC 8.7 12/26/2015   HGB 13.4 12/26/2015   HCT 41.1 12/26/2015   MCV 97.8 12/26/2015   PLT 224 12/26/2015      Chemistry      Component Value Date/Time   NA 141 12/26/2015 1352   NA 140 03/15/2015 0921   K 5.0 12/26/2015 1352   K 3.6 03/15/2015 0921   CL 101 03/15/2015 0921   CO2 27 12/26/2015 1352   CO2 30 03/15/2015 0921   BUN 17.6 12/26/2015 1352   BUN 14 03/15/2015 0921   CREATININE 1.5* 12/26/2015 1352   CREATININE 1.23 03/15/2015 0921      Component Value Date/Time   CALCIUM 9.5 12/26/2015 1352   CALCIUM 9.6 03/15/2015 0921   ALKPHOS 81 12/26/2015 1352   ALKPHOS 62 12/06/2012 1502   AST 16 12/26/2015 1352   AST 18 12/06/2012 1502   ALT 13 12/26/2015 1352   ALT 10 12/06/2012 1502   BILITOT 0.50 12/26/2015 1352   BILITOT 0.6 12/06/2012 1502       RADIOGRAPHIC STUDIES: No results found.  ASSESSMENT AND PLAN: This is a very pleasant 69 years old African-American male with questionable stage IA non-small cell lung cancer status post curative radiotherapy with partial response. There was improvement in the right lower lobe lung mass. I discussed the scan results with the patient today. I recommended for him to continue on observation for now with repeat CT scan of the chest in 3 months for restaging of his disease. He was advised to call immediately if he has any concerning symptoms in the interval. The patient voices understanding of current disease status and treatment options and is in agreement with the current care plan.  All questions were answered. The  patient knows to call the clinic with any problems, questions or concerns. We can certainly see the patient much sooner if necessary.  Disclaimer: This note was dictated with voice recognition software. Similar sounding words can inadvertently be transcribed and may not be corrected upon review.

## 2015-12-27 ENCOUNTER — Telehealth: Payer: Self-pay | Admitting: Internal Medicine

## 2015-12-27 DIAGNOSIS — J449 Chronic obstructive pulmonary disease, unspecified: Secondary | ICD-10-CM | POA: Diagnosis not present

## 2015-12-27 NOTE — Telephone Encounter (Signed)
Spoke with patient and he will get a new march schedule when he sees dr Tammi Klippel 01/04/16

## 2016-01-04 ENCOUNTER — Encounter: Payer: Self-pay | Admitting: Radiation Oncology

## 2016-01-04 ENCOUNTER — Ambulatory Visit
Admission: RE | Admit: 2016-01-04 | Discharge: 2016-01-04 | Disposition: A | Discharge status: Home / Self Care | Payer: Commercial Managed Care - HMO | Source: Ambulatory Visit | Attending: Radiation Oncology | Admitting: Radiation Oncology

## 2016-01-04 VITALS — BP 121/58 | HR 63 | Resp 16 | Wt 224.0 lb

## 2016-01-04 DIAGNOSIS — C3431 Malignant neoplasm of lower lobe, right bronchus or lung: Secondary | ICD-10-CM | POA: Diagnosis not present

## 2016-01-04 NOTE — Progress Notes (Signed)
Weight and vitals stable. Denies pain. Reports a productive cough with clear sputum worse in the morning. Denies SOB. Denies pain or difficulty associated with swallowing. Denies fatigue. Denies headache, dizziness, nausea, vomiting, diplopia or ringing in the ears. Patient without complaints.   BP 121/58 mmHg  Pulse 63  Resp 16  Wt 224 lb (101.606 kg)  SpO2 94% Wt Readings from Last 3 Encounters:  01/04/16 224 lb (101.606 kg)  12/26/15 222 lb 9.6 oz (100.971 kg)  12/01/15 220 lb (99.791 kg)

## 2016-01-04 NOTE — Progress Notes (Signed)
Radiation Oncology         [phone] ________________________________  Name: John [name] MRN: [id number]  Date: 01/04/2016  DOB: [birth year]    Follow-Up Visit Note  CC: No PCP Per Patient  Melrose Nakayama, *  Diagnosis: Primary cancer of right lower lobe of lung    ICD-9-CM ICD-10-CM   1. Primary cancer of right lower lobe of lung (HCC) 162.5 C34.31    Interval Since Last Radiation: 5 months [06/26/2015-08/10/2015] at 33 fractions  Narrative:  The patient returns today for routine follow-up with radiation oncology. Weight and vitals stable. Denies pain. Reports a productive cough with clear sputum worse in the morning. Denies SOB. Denies pain or difficulty associated with swallowing. Denies fatigue. Denies headache, dizziness, nausea, vomiting, diplopia or ringing in the ears. Patient without complaints. He is not currently receiving chemotherapy.  ALLERGIES:  is allergic to levaquin and lisinopril.  Meds: Current Outpatient Prescriptions  Medication Sig Dispense Refill  . albuterol (PROAIR HFA) 108 (90 BASE) MCG/ACT inhaler INHALE 2 PUFFS BY MOUTH EVERY 4 HOURS AS NEEDED FOR WHEEZING 1 Inhaler 11  . allopurinol (ZYLOPRIM) 100 MG tablet TAKE 1 TABLET BY MOUTH EVERY DAY 30 tablet 5  . amLODipine (NORVASC) 10 MG tablet TAKE 1 TABLET BY MOUTH EVERY DAY 30 tablet 5  . atorvastatin (LIPITOR) 20 MG tablet TAKE 1 TABLET BY MOUTH ONCE DAILY 90 tablet 2  . bisoprolol (ZEBETA) 5 MG tablet Take 5 mg by mouth daily.     . cloNIDine (CATAPRES) 0.1 MG tablet Take 0.1 mg by mouth 2 (two) times daily.    . colchicine 0.6 MG tablet TAKE 1 TABLET BY MOUTH EVERY DAY 30 tablet 0  . fenofibrate (TRICOR) 48 MG tablet TAKE 1 TABLET BY MOUTH EVERY DAY  NEEDS OFFICE VISIT (Patient taking differently: TAKE 1 TABLET BY MOUTH EVERY DAY  **NEEDS OFFICE VISIT) 30 tablet 5  . furosemide (LASIX) 20 MG tablet TAKE 1 TABLET BY MOUTH DAILY 30 tablet 5  . polyethylene glycol powder (GLYCOLAX/MIRALAX)  powder DISSOLVE 17 GRAMS (1 CAPFUL) INTO 8 OUNCES OF LIQUID AND DRINK EVERY DAY (Patient taking differently: DISSOLVE 17 GRAMS (1 CAPFUL) INTO 8 OUNCES OF LIQUID AND DRINK EVERY DAY as needed for constipation) 527 g 11  . spironolactone (ALDACTONE) 25 MG tablet TAKE 1/2 TABLET BY MOUTH EVERY DAY   NEED OFFICE VISIT (Patient taking differently: TAKE 1/2 TABLET BY MOUTH EVERY DAY   *NEED OFFICE VISIT) 30 tablet 5  . sucralfate (CARAFATE) 1 G tablet Take 1 tablet (1 g total) by mouth 4 (four) times daily -  with meals and at bedtime. 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 SYMBICORT 160-4.5 MCG/ACT inhaler INHALE 2 PUFFS BY MOUTH EVERY 12 HOURS (FIRST THING IN THE MORNING THEN 12 HOURS LATER) 10.2 g 2  . VENTOLIN HFA 108 (90 BASE) MCG/ACT inhaler INHALE 2 PUFFS BY MOUTH EVERY 4 HOURS AS NEEDED FOR WHEEZING 18 g 2   No current facility-administered medications for this encounter.    Physical Findings: The patient is in no acute distress and is alert and oriented x3.  weight is 224 lb (101.606 kg). His blood pressure is 121/58 and his pulse is 63. His respiration is 16 and oxygen saturation is 94%.  There is no significant changes in the status of the patient's overall health to be noted at this time.   Lab Findings: Lab Results  Component Value Date   WBC 8.7 12/26/2015  WBC 10.0 06/05/2015   HGB 13.4 12/26/2015   HGB 13.3 06/05/2015   HCT 41.1 12/26/2015   HCT 42.5 06/05/2015   PLT 224 12/26/2015   PLT 242 06/05/2015    Lab Results  Component Value Date   NA 141 12/26/2015   NA 140 03/15/2015   K 5.0 12/26/2015   K 3.6 03/15/2015   CHLORIDE 104 12/26/2015   CO2 27 12/26/2015   CO2 30 03/15/2015   GLUCOSE 117 12/26/2015   GLUCOSE 129* 03/15/2015   BUN 17.6 12/26/2015   BUN 14 03/15/2015   CREATININE 1.5* 12/26/2015   CREATININE 1.23 03/15/2015   BILITOT 0.50 12/26/2015   BILITOT 0.6 12/06/2012   ALKPHOS 81 12/26/2015   ALKPHOS 62 12/06/2012   AST  16 12/26/2015   AST 18 12/06/2012   ALT 13 12/26/2015   ALT 10 12/06/2012   PROT 8.0 12/26/2015   PROT 7.2 12/06/2012   ALBUMIN 4.0 12/26/2015   ALBUMIN 3.0* 12/06/2012   CALCIUM 9.5 12/26/2015   CALCIUM 9.6 03/15/2015   ANIONGAP 10 12/26/2015   ANIONGAP 15 11/03/2014    Radiographic Findings: No results found.  Impression: John [name] is a 70 year old male presenting to clinic in regards to his primary cancer of right lower lobe of lung. The patient is recovering from the effects of radiation. His CT shows some decrease in the size of the primary tumor and resolution of adenopathy.  Plan: He had a CT scan scheduled, but went to the wrong place, so it is being rescheduled. He will continue to follow up with Dr. Julien Nordmann. He will follow up with me on an as needed basis. He knows he can call me if he has any problems or questions. _____________________________________  Sheral Apley Tammi Klippel, M.D.     This document serves as a record of services personally performed by Tyler Pita, MD. It was created on his behalf by Lendon Collar, a trained medical scribe. The creation of this record is based on the scribe's personal observations and the provider's statements to them. This document has been checked and approved by the attending provider.

## 2016-01-11 ENCOUNTER — Other Ambulatory Visit: Payer: Self-pay | Admitting: Internal Medicine

## 2016-01-27 DIAGNOSIS — J449 Chronic obstructive pulmonary disease, unspecified: Secondary | ICD-10-CM | POA: Diagnosis not present

## 2016-02-06 ENCOUNTER — Other Ambulatory Visit: Payer: Self-pay | Admitting: Internal Medicine

## 2016-02-06 MED ORDER — FENOFIBRATE 48 MG PO TABS: 48.0000 mg | ORAL_TABLET | Freq: Every day | ORAL | Status: DC

## 2016-02-08 ENCOUNTER — Other Ambulatory Visit: Payer: Self-pay | Admitting: Internal Medicine

## 2016-02-27 DIAGNOSIS — J449 Chronic obstructive pulmonary disease, unspecified: Secondary | ICD-10-CM | POA: Diagnosis not present

## 2016-03-15 ENCOUNTER — Ambulatory Visit (INDEPENDENT_AMBULATORY_CARE_PROVIDER_SITE_OTHER): Payer: Commercial Managed Care - HMO | Admitting: Internal Medicine

## 2016-03-15 ENCOUNTER — Encounter: Payer: Self-pay | Admitting: Internal Medicine

## 2016-03-15 VITALS — BP 124/76 | HR 73 | Ht 65.0 in | Wt 219.0 lb

## 2016-03-15 DIAGNOSIS — M7020 Olecranon bursitis, unspecified elbow: Secondary | ICD-10-CM | POA: Diagnosis not present

## 2016-03-15 DIAGNOSIS — J449 Chronic obstructive pulmonary disease, unspecified: Secondary | ICD-10-CM | POA: Diagnosis not present

## 2016-03-15 DIAGNOSIS — M7030 Other bursitis of elbow, unspecified elbow: Secondary | ICD-10-CM

## 2016-03-15 DIAGNOSIS — J9611 Chronic respiratory failure with hypoxia: Secondary | ICD-10-CM | POA: Diagnosis not present

## 2016-03-15 NOTE — Patient Instructions (Addendum)
Plan A = Automatic = Symbicort 160 Take 2 puffs first thing in am and then another 2 puffs about 12 hours later.     Plan B = Backup Only use your albuterol Proair)  as a rescue medication to be used if you can't catch your breath by resting or doing a relaxed purse lip breathing pattern.  - The less you use it, the better it will work when you need it. - Ok to use up to 2 puffs  every 4 hours if you must but call for appointment if use goes up over your usual need - Don't leave home without it !!  (think of it like the spare tire for your car)   Plan C = Crisis - only use your albuterol nebulizer if you first try Plan B and it fails to help > ok to use the nebulizer up to every 4 hours but if start needing it regularly call for immediate appointment   Plan D = Doctor - call me if B and C not adequate  Plan E = ER - go to ER or call 911 if all else fails    Please see patient coordinator before you leave today  to schedule orthopedic referral   Return to your primary care doctors  Keep your previous appt to see me Add needs walking sats next ov

## 2016-03-15 NOTE — Progress Notes (Signed)
Subjective:    Patient ID: John [name], male    DOB: 1946-03-21   MRN: [id number]  Brief patient profile:  68 yobm quit smoking 09/2012 with GOLD II copd 01/2013 and  with multiple admits for "aecopd" last one:   Admit date: 12/06/2012  Discharge date: 12/08/2012   Discharge Diagnoses:  COPD exacerbation  HTN (hypertension)  Allergic drug rash  Drug-induced hyperglycemia  CHF exacerbation  Hyperglycemia induced by steroids  CAD   History of Present Illness  12/18/2012 1st pulmonary ov/ Joseeduardo Brix cc doe s 02 up to half a mile s stopping and overall using 02 mostly evening before bed on advair 500/50 q am only and does not know how to use it correctly. Not needing any daytime saba but generally confused on how to use meds.  >change to Advair 250/50     03/01/2013 f/u ov/Eathen Budreau cc confused with meds/ med calendar using advair 500 and confused with how to use hfa albuterol but overall doing better rec Advair 250/50 one twice daily = Plan A Only use your albuterol (ventolin = Plan B)   Please see patient coordinator before you leave today  to schedule portable 02      09/15/2013 f/u ov/Jermery Caratachea re: copd/02 dep Chief Complaint  Patient presents with  . Follow-up    Pt states his breathing is unchanged since the last visit. No new co's today. Usinf albuterol HFA approx 3 times per wk.  not really limited from desired activities rec Stop carvedilol (coreg) and substitute with bisoprolol 5 mg  One daily  Continue symbicort 160 Take 2 puffs first thing in am and then another 2 puffs about 12 hours later Remember don't leave home without your rescue inhaler (ventolin) Rec:  02 2lpm sleeping and walking outside the house     03/13/2015 f/u ov/Khanh Tanori re: copd GOLD II/ confused with meds / no med calendar  Chief Complaint  Patient presents with  . Follow-up    Pt states that his breathing is unchanged since his last visit.   avg use rescue only once or twice daily esp a problem with bending  over or if gets in a big hurry but ok with slow paced flat walking/ adls  Using 02 just at hs , not with ambulation  Has saba and respiclick and not sure which one to use but likes hfa better  rec symbicort 160 Take 2 puffs first thing in am and then another 2 puffs about 12 hours later.  Only use your albuterol (proair) as a rescue medication  Please remember to go to the  x-ray department  > Pos SPN > PET pos.     04/20/2015 f/u ov/Erina Hamme re: copd GOLD II/ spn RLL with ? Pos node ant med Not using symbicort 160 consistently  / no sob slow and flat despite missing meds  >>refer to [facility]  06/14/2015 Follow up : COPD GOLD II /+Lung Cancer -Squamous cell  Pt returns for 2 month follow up .  Pt with with RLL nodule 2.1cm positive on PET scan .  Underwent CT guided bx on 6/8 that was positive squamous cell carcinoma.  Has seen [facility] for surgical consult . Pt with multiple chronic medical problems including  CAD and pulmonary HTN . Last FEV1 46%. Will need new full PFT to proceed for consideration  Of wedge resection .also would need cardiology consult with Right and left heart cath   He has also been sent to Radiation Oncology this week and Oncology  Next with DR. Mohamed.  rec Continue on Symbicort 2 puffs Twice daily   Biopsy results were positive for Lung cancer Squamous Cell Carcinoma .  You have an appointment with Dr. Julien Nordmann next week on 06/21/15 .     12/01/2015  f/u ov/Laqueta Bonaventura re: copd II maint rx symb 160 bid  Chief Complaint  Patient presents with  . Follow-up    Pt states that he is doing well. Pt denies cough/wheeze/SOB/CP/tightness.   Over using saba at baseline but otherwise doing well/ still not really understanding how to use prns Doe x MMRC2 = can't walk a nl pace on a flat grade s sob rec Work on perfecting inhaler technique: Only use your albuterol(Proair)  as a rescue medication Only use the albuterol neb if you try your proair first and it doesn't work > call for  immediate appointment if need for this goes up  Please schedule a follow up visit in 6 months but call sooner if needed       03/15/2016  f/u ov/Vegas Coffin re:  Copd II on Maint rx symb 160 2bid / no PC whereas had been using [facility] Chief Complaint  Patient presents with  . Acute Visit    Pt c/o swelling in his elbows "for a while"- occ feels painful and "puffs up". He does not have PCP yet.  doe chronic = MMRC2 = can't walk a nl pace on a flat grade s sob    No obvious day to day or daytime variability or assoc excess/ purulent sputum or mucus plugs or hemoptysis or cp or chest tightness, subjective wheeze or overt sinus or hb symptoms. No unusual exp hx or h/o childhood pna/ asthma or knowledge of premature birth.  Sleeping ok without nocturnal  or early am exacerbation  of respiratory  c/o's or need for noct saba. Also denies any obvious fluctuation of symptoms with weather or environmental changes or other aggravating or alleviating factors except as outlined above   Current Medications, Allergies, Complete Past Medical History, Past Surgical History, Family History, and Social History were reviewed in [employer] record.  ROS  The following are not active complaints unless bolded sore throat, dysphagia, dental problems, itching, sneezing,  nasal congestion or excess/ purulent secretions, ear ache,   fever, chills, sweats, unintended wt loss, classically pleuritic or exertional cp,  orthopnea pnd or leg swelling, presyncope, palpitations, abdominal pain, anorexia, nausea, vomiting, diarrhea  or change in bowel or bladder habits, change in stools or urine, dysuria,hematuria,  rash, arthralgias, visual complaints, headache, numbness, weakness or ataxia or problems with walking or coordination,  change in mood/affect or memory.           Objective:   Physical Exam  03/19/2013  Wt 208 vs 06/17/2013 209  09/15/2013  206 > 212 01/10/2014 > 07/11/2014  210  > 10/10/2014 209  >210 01/11/2015 > 03/13/2015 212 > 04/20/2015  213>219 06/14/2015 > 12/01/2015   220 > 03/15/2016  220    amb bm nad  HEENT mild turbinate edema.  Edentulous,  Oropharynx no thrush or excess pnd or cobblestoning. Edentulous with dentures in place No JVD or cervical adenopathy. Mild accessory muscle hypertrophy. Trachea midline, nl thryroid. Chest was hyperinflated by percussion with diminished breath sounds and moderate increased exp time without wheeze.  Regular rate and rhythm without murmur gallop or rub or increase P2 or edema.  Abd: quite protuberant/ no hsm, nl excursion. Ext warm without cyanosis or clubbing.   MS bilateral  olecranon chronic bursitis changes but full ROM/ no pus              Assessment & Plan:

## 2016-03-20 NOTE — Assessment & Plan Note (Signed)
Appears very chronic >>>  Referred to ortho

## 2016-03-20 NOTE — Assessment & Plan Note (Signed)
Complicated by hbp/ hyperlipidemia   Body mass index is 36.44    Lab Results  Component Value Date   TSH 1.241 02/22/2012     Contributing to gerd tendency/ doe/reviewed the need and the process to achieve and maintain neg calorie balance > defer f/u primary care including intermittently monitoring thyroid status .

## 2016-03-20 NOTE — Assessment & Plan Note (Signed)
-   12/18/2012  Walked RA  2 laps @ 185 ft each stopped due to  desat 87% - 03/19/2013  Walked 2lpm x 3 laps @ 185 ft each stopped due to  End of study, sat 91% - 06/17/2013   Walked RA x one lap @ 185 stopped due to desat to 87 when started second lap -09/15/2013   Walked RA x one lap @ 185 stopped due to sob, no desat  - 04/20/2015  Walked RA x 3 laps @ 185 ft each stopped due to  End of study, no pace, no desat   Rec:  As of 03/15/2016  02 2lpm sleeping  Only   Will check walking sats next ov

## 2016-03-20 NOTE — Assessment & Plan Note (Signed)
-   PFT's 02/05/13 FEV1  1.46  (50%) ratio 55 p 22% better p B2,  DLCO  61 corrects to 81% -  04/20/2015 p extensive coaching HFA effectiveness =      90%  -  04/20/2015  Walked RA x 3 laps @ 185 ft each stopped due to  End study, no sob, nl pace  - Spirometry 04/20/2015 >  FEV1  1.21 (48%) ratio 58 before am rx   Moderately severe but well compensated copd on present rx with maint symb 160 bid  I had an extended discussion with the patient reviewing all relevant studies completed to date and  lasting 15 to 20 minutes of a 25 minute visit    Each maintenance medication was reviewed in detail including most importantly the difference between maintenance and prns and under what circumstances the prns are to be triggered using an action plan format that is not reflected in the computer generated alphabetically organized AVS.    Please see instructions for details which were reviewed in writing and the patient given a copy highlighting the part that I personally wrote and discussed at today's ov.

## 2016-03-25 ENCOUNTER — Other Ambulatory Visit (HOSPITAL_BASED_OUTPATIENT_CLINIC_OR_DEPARTMENT_OTHER): Payer: Commercial Managed Care - HMO

## 2016-03-25 DIAGNOSIS — C3431 Malignant neoplasm of lower lobe, right bronchus or lung: Secondary | ICD-10-CM

## 2016-03-25 LAB — CBC WITH DIFFERENTIAL/PLATELET
BASO%: 0.7 % (ref 0.0–2.0)
Basophils Absolute: 0.1 10*3/uL (ref 0.0–0.1)
EOS ABS: 0.3 10*3/uL (ref 0.0–0.5)
EOS%: 4 % (ref 0.0–7.0)
HEMATOCRIT: 39.1 % (ref 38.4–49.9)
HEMOGLOBIN: 12.7 g/dL — AB (ref 13.0–17.1)
LYMPH#: 0.9 10*3/uL (ref 0.9–3.3)
LYMPH%: 11.1 % — AB (ref 14.0–49.0)
MCH: 31.9 pg (ref 27.2–33.4)
MCHC: 32.4 g/dL (ref 32.0–36.0)
MCV: 98.2 fL — AB (ref 79.3–98.0)
MONO#: 0.8 10*3/uL (ref 0.1–0.9)
MONO%: 9.1 % (ref 0.0–14.0)
NEUT%: 75.1 % — ABNORMAL HIGH (ref 39.0–75.0)
NEUTROS ABS: 6.2 10*3/uL (ref 1.5–6.5)
PLATELETS: 224 10*3/uL (ref 140–400)
RBC: 3.98 10*6/uL — ABNORMAL LOW (ref 4.20–5.82)
RDW: 13.6 % (ref 11.0–14.6)
WBC: 8.3 10*3/uL (ref 4.0–10.3)

## 2016-03-25 LAB — COMPREHENSIVE METABOLIC PANEL
ALBUMIN: 3.7 g/dL (ref 3.5–5.0)
ALT: 14 U/L (ref 0–55)
AST: 15 U/L (ref 5–34)
Alkaline Phosphatase: 81 U/L (ref 40–150)
Anion Gap: 9 mEq/L (ref 3–11)
BILIRUBIN TOTAL: 0.37 mg/dL (ref 0.20–1.20)
BUN: 16.5 mg/dL (ref 7.0–26.0)
CALCIUM: 9.5 mg/dL (ref 8.4–10.4)
CHLORIDE: 106 meq/L (ref 98–109)
CO2: 29 mEq/L (ref 22–29)
CREATININE: 1.4 mg/dL — AB (ref 0.7–1.3)
EGFR: 57 mL/min/{1.73_m2} — ABNORMAL LOW (ref >90)
Glucose: 144 mg/dl — ABNORMAL HIGH (ref 70–140)
Potassium: 3.9 mEq/L (ref 3.5–5.1)
Sodium: 143 mEq/L (ref 136–145)
TOTAL PROTEIN: 7.9 g/dL (ref 6.4–8.3)

## 2016-03-26 ENCOUNTER — Ambulatory Visit: Payer: Commercial Managed Care - HMO | Admitting: Internal Medicine

## 2016-03-26 DIAGNOSIS — J449 Chronic obstructive pulmonary disease, unspecified: Secondary | ICD-10-CM | POA: Diagnosis not present

## 2016-04-01 ENCOUNTER — Ambulatory Visit (HOSPITAL_COMMUNITY)
Admission: RE | Admit: 2016-04-01 | Discharge: 2016-04-01 | Disposition: A | Discharge status: Home / Self Care | Payer: Commercial Managed Care - HMO | Source: Ambulatory Visit | Attending: Internal Medicine | Admitting: Internal Medicine

## 2016-04-01 ENCOUNTER — Encounter (HOSPITAL_COMMUNITY): Payer: Self-pay

## 2016-04-01 DIAGNOSIS — K8020 Calculus of gallbladder without cholecystitis without obstruction: Secondary | ICD-10-CM | POA: Insufficient documentation

## 2016-04-01 DIAGNOSIS — R911 Solitary pulmonary nodule: Secondary | ICD-10-CM | POA: Diagnosis not present

## 2016-04-01 DIAGNOSIS — I7789 Other specified disorders of arteries and arterioles: Secondary | ICD-10-CM | POA: Diagnosis not present

## 2016-04-01 DIAGNOSIS — I2510 Atherosclerotic heart disease of native coronary artery without angina pectoris: Secondary | ICD-10-CM | POA: Insufficient documentation

## 2016-04-01 DIAGNOSIS — C3431 Malignant neoplasm of lower lobe, right bronchus or lung: Secondary | ICD-10-CM | POA: Diagnosis not present

## 2016-04-01 MED ORDER — IOPAMIDOL (ISOVUE-300) INJECTION 61%
75.0000 mL | Freq: Once | INTRAVENOUS | Status: AC | PRN
  Administered 2016-04-01: 75 mL via INTRAVENOUS

## 2016-04-02 ENCOUNTER — Encounter: Payer: Self-pay | Admitting: Internal Medicine

## 2016-04-02 ENCOUNTER — Ambulatory Visit (HOSPITAL_BASED_OUTPATIENT_CLINIC_OR_DEPARTMENT_OTHER): Payer: Commercial Managed Care - HMO | Admitting: Internal Medicine

## 2016-04-02 VITALS — BP 129/80 | HR 65 | Temp 98.2°F | Resp 18 | Ht 65.0 in | Wt 219.7 lb

## 2016-04-02 DIAGNOSIS — C3431 Malignant neoplasm of lower lobe, right bronchus or lung: Secondary | ICD-10-CM | POA: Diagnosis not present

## 2016-04-02 NOTE — Progress Notes (Signed)
[facility] [phone]   [phone]  OFFICE PROGRESS NOTE  Renato Shin, MD [address] [place] [address]  DIAGNOSIS: Stage IA (T1b, N0, M0) non-small cell lung cancer, squamous cell carcinoma diagnosed in June 2016.  PRIOR THERAPY: Course of curative radiotherapy to the right lower lobe lung nodule as well as mediastinal lymphadenopathy under the care of Dr. Tammi Klippel.  CURRENT THERAPY: Observation.  INTERVAL HISTORY: John [name] 70 y.o. male returns to the clinic today for 6 month follow-up visit. The patient is feeling fine today with no specific complaints. He denied having any significant chest pain or shortness of breath,  cough or hemoptysis. He has no significant weight loss or night sweats. The patient denied having any nausea or vomiting, no fever or chills. He had repeat CT scan of the chest performed recently and he is here for evaluation and discussion of his scan results.  MEDICAL HISTORY: Past Medical History  Diagnosis Date  . CHF (congestive heart failure) ([place])   . MI (myocardial infarction) ([place])   . COPD (chronic obstructive pulmonary disease) ([place])   . Hypertension   . Gout   . Hyperlipemia   . Pulmonary hypertension ([place])   . Lung cancer ([place])     squamous cell carcinoma of right lower lobe lung    ALLERGIES:  is allergic to levaquin and lisinopril.  MEDICATIONS:  Current Outpatient Prescriptions  Medication Sig Dispense Refill  . albuterol (PROAIR HFA) 108 (90 BASE) MCG/ACT inhaler INHALE 2 PUFFS BY MOUTH EVERY 4 HOURS AS NEEDED FOR WHEEZING 1 Inhaler 11  . allopurinol (ZYLOPRIM) 100 MG tablet TAKE 1 TABLET BY MOUTH EVERY DAY 30 tablet 3  . amLODipine (NORVASC) 10 MG tablet TAKE 1 TABLET BY MOUTH EVERY DAY 30 tablet 3  . atorvastatin (LIPITOR) 20 MG tablet Take 1 tablet (20 mg total) by mouth daily. 30 tablet 0  . bisoprolol (ZEBETA) 5 MG tablet TAKE 1 TABLET BY MOUTH EVERY DAY 30 tablet 3   . cloNIDine (CATAPRES) 0.1 MG tablet TAKE 1 TABLET BY MOUTH TWICE DAILY. 60 tablet 5  . COLCRYS 0.6 MG tablet TAKE 1 TABLET BY MOUTH EVERY DAY 30 tablet 5  . fenofibrate (TRICOR) 48 MG tablet Take 1 tablet (48 mg total) by mouth daily. 30 tablet 5  . furosemide (LASIX) 20 MG tablet TAKE 1 TABLET BY MOUTH DAILY. 30 tablet 5  . polyethylene glycol powder (GLYCOLAX/MIRALAX) powder DISSOLVE 17 GRAMS (1 CAPFUL) INTO 8 OUNCES OF LIQUID AND DRINK EVERY DAY 119 g 5  . spironolactone (ALDACTONE) 25 MG tablet TAKE 1/2 TABLET BY MOUTH EVERY DAY  **NEED OFFICE VISIT** 30 tablet 0  . sucralfate (CARAFATE) 1 G tablet Take 1 tablet (1 g total) by mouth 4 (four) times daily -  with meals and at bedtime. 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 SYMBICORT 160-4.5 MCG/ACT inhaler INHALE 2 PUFFS BY MOUTH EVERY 12 HOURS (FIRST THING IN THE MORNING THEN 12 HOURS LATER) 1 Inhaler 5   No current facility-administered medications for this visit.    SURGICAL HISTORY:  Past Surgical History  Procedure Laterality Date  . Appendectomy    . Lung biopsy      REVIEW OF SYSTEMS:  A comprehensive review of systems was negative.   PHYSICAL EXAMINATION: General appearance: alert, cooperative and no distress Head: Normocephalic, without obvious abnormality, atraumatic Neck: no adenopathy, no JVD, supple, symmetrical, trachea midline  and thyroid not enlarged, symmetric, no tenderness/mass/nodules Lymph nodes: Cervical, supraclavicular, and axillary nodes normal. Resp: clear to auscultation bilaterally Back: symmetric, no curvature. ROM normal. No CVA tenderness. Cardio: regular rate and rhythm, S1, S2 normal, no murmur, click, rub or gallop GI: soft, non-tender; bowel sounds normal; no masses,  no organomegaly Extremities: extremities normal, atraumatic, no cyanosis or edema  ECOG PERFORMANCE STATUS: 1 - Symptomatic but completely ambulatory  Blood pressure 129/80, pulse 65, temperature 98.2  F (36.8 C), temperature source Oral, resp. rate 18, height '5\' 5"'$  (1.651 m), weight 219 lb 11.2 oz (99.655 kg), SpO2 99 %.  LABORATORY DATA: Lab Results  Component Value Date   WBC 8.3 03/25/2016   HGB 12.7* 03/25/2016   HCT 39.1 03/25/2016   MCV 98.2* 03/25/2016   PLT 224 03/25/2016      Chemistry      Component Value Date/Time   NA 143 03/25/2016 0905   NA 140 03/15/2015 0921   K 3.9 03/25/2016 0905   K 3.6 03/15/2015 0921   CL 101 03/15/2015 0921   CO2 29 03/25/2016 0905   CO2 30 03/15/2015 0921   BUN 16.5 03/25/2016 0905   BUN 14 03/15/2015 0921   CREATININE 1.4* 03/25/2016 0905   CREATININE 1.23 03/15/2015 0921      Component Value Date/Time   CALCIUM 9.5 03/25/2016 0905   CALCIUM 9.6 03/15/2015 0921   ALKPHOS 81 03/25/2016 0905   ALKPHOS 62 12/06/2012 1502   AST 15 03/25/2016 0905   AST 18 12/06/2012 1502   ALT 14 03/25/2016 0905   ALT 10 12/06/2012 1502   BILITOT 0.37 03/25/2016 0905   BILITOT 0.6 12/06/2012 1502       RADIOGRAPHIC STUDIES: Ct Chest W Contrast  04/01/2016  CLINICAL DATA:  Right lung cancer, chemotherapy and radiation therapy complete. EXAM: CT CHEST WITH CONTRAST TECHNIQUE: Multidetector CT imaging of the chest was performed during intravenous contrast administration. CONTRAST:  63m ISOVUE-300 IOPAMIDOL (ISOVUE-300) INJECTION 61% COMPARISON:  11/15/2015. FINDINGS: Mediastinum/Nodes: No pathologically enlarged mediastinal, hilar or axillary lymph nodes. Pulmonary arteries are enlarged. Atherosclerotic calcification of the arterial vasculature, including three-vessel involvement of the coronary arteries. Heart is at the upper limits of normal in size to mildly enlarged. No pericardial effusion. There may be slight distal esophageal wall thickening, which can be seen with gastroesophageal reflux disease. Lungs/Pleura: Image quality is somewhat degraded by respiratory motion. Nodular scarring in the apical right upper lobe. Residual irregular  subpleural nodular density in the right lower lobe measures 1.2 x 1.6 cm (5/44), stable from 11/15/2015 and decreased from 03/27/2015, at which time it measured 1.9 x 2.0 cm. Mild surrounding post treatment changes in the right lung. No pleural fluid. Airway is unremarkable. Upper abdomen: Visualized portion of the liver is unremarkable. Stones are seen in the gallbladder. Adrenal glands are unremarkable. Low-attenuation lesions in the right kidney measure up to 6.1 cm, similar to prior but incompletely imaged. Visualized portions of the left kidney, spleen, pancreas, stomach and bowel are grossly unremarkable. No upper abdominal adenopathy. Musculoskeletal: Extensive degenerative change in the spine. No worrisome lytic or sclerotic lesions. IMPRESSION: 1. Irregular subpleural right lower lobe nodule is stable from 11/15/2015, with surrounding post treatment changes in the right lung. No evidence of metastatic disease. 2. Enlarged pulmonary arteries, indicative of pulmonary arterial hypertension. 3. Three-vessel coronary artery calcification. 4. Cholelithiasis. Electronically Signed   By: MLorin PicketM.D.   On: 04/01/2016 12:49    ASSESSMENT AND PLAN: This is a very  pleasant 70 years old African-American male with questionable stage IA non-small cell lung cancer status post curative radiotherapy with partial response.  The recent CT scan of the chest showed no evidence for disease recurrence. I discussed the scan results with the patient today. I recommended for him to continue on observation with repeat CT scan of the chest in 6 months. He was advised to call immediately if he has any concerning symptoms in the interval. The patient voices understanding of current disease status and treatment options and is in agreement with the current care plan.  All questions were answered. The patient knows to call the clinic with any problems, questions or concerns. We can certainly see the patient much sooner if  necessary.  Disclaimer: This note was dictated with voice recognition software. Similar sounding words can inadvertently be transcribed and may not be corrected upon review.

## 2016-04-05 DIAGNOSIS — Z7189 Other specified counseling: Secondary | ICD-10-CM | POA: Diagnosis not present

## 2016-04-05 DIAGNOSIS — E559 Vitamin D deficiency, unspecified: Secondary | ICD-10-CM | POA: Diagnosis not present

## 2016-04-05 DIAGNOSIS — I10 Essential (primary) hypertension: Secondary | ICD-10-CM | POA: Diagnosis not present

## 2016-04-05 DIAGNOSIS — M109 Gout, unspecified: Secondary | ICD-10-CM | POA: Diagnosis not present

## 2016-04-26 DIAGNOSIS — J449 Chronic obstructive pulmonary disease, unspecified: Secondary | ICD-10-CM | POA: Diagnosis not present

## 2016-05-02 ENCOUNTER — Other Ambulatory Visit: Payer: Self-pay | Admitting: Internal Medicine

## 2016-05-15 DIAGNOSIS — Z719 Counseling, unspecified: Secondary | ICD-10-CM | POA: Diagnosis not present

## 2016-05-15 DIAGNOSIS — E559 Vitamin D deficiency, unspecified: Secondary | ICD-10-CM | POA: Diagnosis not present

## 2016-05-15 DIAGNOSIS — E669 Obesity, unspecified: Secondary | ICD-10-CM | POA: Diagnosis not present

## 2016-05-15 DIAGNOSIS — Z713 Dietary counseling and surveillance: Secondary | ICD-10-CM | POA: Diagnosis not present

## 2016-05-26 DIAGNOSIS — J449 Chronic obstructive pulmonary disease, unspecified: Secondary | ICD-10-CM | POA: Diagnosis not present

## 2016-05-28 ENCOUNTER — Telehealth: Payer: Self-pay | Admitting: Endocrinology

## 2016-05-28 DIAGNOSIS — J441 Chronic obstructive pulmonary disease with (acute) exacerbation: Secondary | ICD-10-CM

## 2016-05-28 NOTE — Telephone Encounter (Signed)
[facility] called and needs a [employer] Auth for this PT for 05/31/16.  CB# [phone]

## 2016-05-28 NOTE — Telephone Encounter (Signed)
i have done the referral, but I am not PCP, so I have taken my name off

## 2016-05-28 NOTE — Telephone Encounter (Signed)
See note below and please advise, Thanks! 

## 2016-05-30 ENCOUNTER — Telehealth: Payer: Self-pay | Admitting: Endocrinology

## 2016-05-30 ENCOUNTER — Other Ambulatory Visit: Payer: Self-pay | Admitting: Internal Medicine

## 2016-05-30 NOTE — Telephone Encounter (Signed)
[name] from Pulmonary stated patient need a referral put in to see Dr Melvyn Novas  for COPD J44.9 [phone]

## 2016-05-30 NOTE — Telephone Encounter (Signed)
I contact Carrie with [facility] and she stated a [employer] referral needed to be placed. I contacted [facility] with [facility] and she has put the information in [place].

## 2016-05-30 NOTE — Telephone Encounter (Signed)
i did last week

## 2016-05-31 ENCOUNTER — Ambulatory Visit: Payer: Commercial Managed Care - HMO | Admitting: Internal Medicine

## 2016-06-04 ENCOUNTER — Ambulatory Visit: Payer: Commercial Managed Care - HMO | Admitting: Internal Medicine

## 2016-06-27 ENCOUNTER — Other Ambulatory Visit: Payer: Self-pay | Admitting: Internal Medicine

## 2016-07-23 ENCOUNTER — Other Ambulatory Visit: Payer: Self-pay | Admitting: Internal Medicine

## 2016-08-22 ENCOUNTER — Other Ambulatory Visit: Payer: Self-pay | Admitting: Internal Medicine

## 2016-09-13 ENCOUNTER — Telehealth (HOSPITAL_COMMUNITY): Payer: Self-pay | Admitting: Vascular Surgery

## 2016-09-13 ENCOUNTER — Other Ambulatory Visit (HOSPITAL_COMMUNITY): Payer: Self-pay

## 2016-09-13 DIAGNOSIS — E559 Vitamin D deficiency, unspecified: Secondary | ICD-10-CM | POA: Diagnosis not present

## 2016-09-13 DIAGNOSIS — E785 Hyperlipidemia, unspecified: Secondary | ICD-10-CM | POA: Diagnosis not present

## 2016-09-13 DIAGNOSIS — I10 Essential (primary) hypertension: Secondary | ICD-10-CM | POA: Diagnosis not present

## 2016-09-13 DIAGNOSIS — E669 Obesity, unspecified: Secondary | ICD-10-CM | POA: Diagnosis not present

## 2016-09-13 NOTE — Telephone Encounter (Signed)
NP from [facility] called to get patient scheduled with A[facility] clinic office after not being seen for 2 years. Patient was due to follow up 2 months after last appointment but never came or rescheduled. NP reports unsure how patient is taking his medications as we were the last office in charge of his medications and refills. Seems as though Dr. Christinia Gully last MD office to refill meds in June of this year. Faxed med list we have on file to [facility] office for this NP to review. Will forward to AHF clinic scheduler to have scheduled with an echo (last echo 2015).  Renee Pain, RN

## 2016-09-13 NOTE — Telephone Encounter (Signed)
Left pt message to make f/u apt w/ ECHO

## 2016-09-25 ENCOUNTER — Telehealth: Payer: Self-pay | Admitting: Internal Medicine

## 2016-09-25 NOTE — Telephone Encounter (Signed)
LMOMTCB x 1 

## 2016-09-25 NOTE — Telephone Encounter (Signed)
Daughter states that father is about out of med and that he really needs them, please advise.Hillery Hunter

## 2016-09-26 ENCOUNTER — Telehealth: Payer: Self-pay | Admitting: Internal Medicine

## 2016-09-26 MED ORDER — COLCHICINE 0.6 MG PO TABS: 0.6000 mg | ORAL_TABLET | Freq: Every day | ORAL | 0 refills | Status: DC

## 2016-09-26 MED ORDER — AMLODIPINE BESYLATE 10 MG PO TABS: 10.0000 mg | ORAL_TABLET | Freq: Every day | ORAL | 0 refills | Status: DC

## 2016-09-26 MED ORDER — ATORVASTATIN CALCIUM 20 MG PO TABS: 20.0000 mg | ORAL_TABLET | Freq: Every day | ORAL | 0 refills | Status: AC

## 2016-09-26 MED ORDER — BISOPROLOL FUMARATE 5 MG PO TABS: 5.0000 mg | ORAL_TABLET | Freq: Every day | ORAL | 0 refills | Status: DC

## 2016-09-26 MED ORDER — FUROSEMIDE 20 MG PO TABS: 20.0000 mg | ORAL_TABLET | Freq: Every day | ORAL | 0 refills | Status: DC

## 2016-09-26 MED ORDER — ALLOPURINOL 100 MG PO TABS: 100.0000 mg | ORAL_TABLET | Freq: Every day | ORAL | 0 refills | Status: DC

## 2016-09-26 MED ORDER — CLONIDINE HCL 0.1 MG PO TABS: 0.1000 mg | ORAL_TABLET | Freq: Two times a day (BID) | ORAL | 0 refills | Status: DC

## 2016-09-26 MED ORDER — SPIRONOLACTONE 25 MG PO TABS: ORAL_TABLET | ORAL | 0 refills | Status: DC

## 2016-09-26 MED ORDER — FENOFIBRATE 48 MG PO TABS: 48.0000 mg | ORAL_TABLET | Freq: Every day | ORAL | 5 refills | Status: DC

## 2016-09-26 NOTE — Telephone Encounter (Signed)
Called spoke with daughter and is aware of below. Rx's sent in. Pt was already scheduled with [facility] 10/03/16. Aware to bring ALL medications to this visit. Nothing further needed

## 2016-09-26 NOTE — Telephone Encounter (Signed)
There were refilled earlier on pt as well. LMTCB for John [name].

## 2016-09-26 NOTE — Telephone Encounter (Signed)
Schedule ov with Tammy NP in about a month and give enough meds to last till then - but bring all bottles to sort out what he need and where we go from here re: [facility]

## 2016-09-26 NOTE — Telephone Encounter (Signed)
lmtcb x2 for pt. 

## 2016-09-26 NOTE — Telephone Encounter (Signed)
Patient daughter returned call - she can be reached at [phone]

## 2016-09-26 NOTE — Telephone Encounter (Signed)
Per 03/15/16 OV: Plan A = Automatic = Symbicort 160 Take 2 puffs first thing in am and then another 2 puffs about 12 hours later.  Plan B = Backup Only use your albuterol Proair)  as a rescue medication to be used if you can't catch your breath by resting or doing a relaxed purse lip breathing pattern.  - The less you use it, the better it will work when you need it. - Ok to use up to 2 puffs  every 4 hours if you must but call for appointment if use goes up over your usual need - Don't leave home without it !!  (think of it like the spare tire for your car)  Plan C = Crisis - only use your albuterol nebulizer if you first try Plan B and it fails to help > ok to use the nebulizer up to every 4 hours but if start needing it regularly call for immediate appointment Plan D = Doctor - call me if B and C not adequate Plan E = ER - go to ER or call 911 if all else fails  Please see patient coordinator before you leave today  to schedule orthopedic referral  Return to your primary care doctors Keep your previous appt to see me Add needs walking sats next ov  ----  Called spoke with pt daughter. [facility] closed. Pt was suppose to be seeing another PCP but they are refusing to refill his medications bc pt does not want to transfer all of his care to there doctors. She could not give me the name of the facility.  Reports pt needs refills on ALL of his medications he has listed in his chart. Please advise MW thanks

## 2016-09-26 NOTE — Telephone Encounter (Signed)
Lynette called back -advised Lasix and Catapres was refilled for the patient - she states she will call pharmacy - she doesn't need anything else -pr

## 2016-10-03 ENCOUNTER — Encounter: Payer: Self-pay | Admitting: Internal Medicine

## 2016-10-03 ENCOUNTER — Ambulatory Visit (INDEPENDENT_AMBULATORY_CARE_PROVIDER_SITE_OTHER): Payer: Commercial Managed Care - HMO | Admitting: Internal Medicine

## 2016-10-03 ENCOUNTER — Telehealth: Payer: Self-pay | Admitting: Internal Medicine

## 2016-10-03 ENCOUNTER — Other Ambulatory Visit: Payer: Self-pay | Admitting: *Deleted

## 2016-10-03 VITALS — BP 128/72 | HR 82 | Ht 65.5 in | Wt 211.0 lb

## 2016-10-03 DIAGNOSIS — J449 Chronic obstructive pulmonary disease, unspecified: Secondary | ICD-10-CM

## 2016-10-03 DIAGNOSIS — C3431 Malignant neoplasm of lower lobe, right bronchus or lung: Secondary | ICD-10-CM

## 2016-10-03 DIAGNOSIS — J9611 Chronic respiratory failure with hypoxia: Secondary | ICD-10-CM

## 2016-10-03 NOTE — Telephone Encounter (Signed)
lvm to inform pt of added lab appt 10/6 prior to CT per LOS

## 2016-10-03 NOTE — Patient Instructions (Signed)
Plan A = Automatic = Symbicort 160 Take 2 puffs first thing in am and then another 2 puffs about 12 hours later.     Plan B = Backup Only use your albuterol Proair)  as a rescue medication to be used if you can't catch your breath by resting or doing a relaxed purse lip breathing pattern.  - The less you use it, the better it will work when you need it. - Ok to use up to 2 puffs  every 4 hours if you must but call for appointment if use goes up over your usual need - Don't leave home without it !!  (think of it like the spare tire for your car)   Plan C = Crisis - only use your albuterol nebulizer if you first try Plan B and it fails to help > ok to use the nebulizer up to every 4 hours but if start needing it regularly call for immediate appointment   Plan D = Doctor - call me if B and C not adequate  Plan E = ER - go to ER or call 911 if all else fails     If you are satisfied with your treatment plan,  let your doctor know and he/she can either refill your medications or you can return here when your prescription runs out.     If in any way you are not 100% satisfied,  please tell us.  If 100% better, tell your friends!  Pulmonary follow up is as needed

## 2016-10-03 NOTE — Progress Notes (Signed)
Subjective:    Patient ID: John [name], male    DOB: March 01, 1946   MRN: [id number]  Brief patient profile:  69 yobm quit smoking 09/2012 with GOLD II copd 01/2013 and  with multiple admits for "aecopd" last one:   Admit date: 12/06/2012  Discharge date: 12/08/2012   Discharge Diagnoses:  COPD exacerbation  HTN (hypertension)  Allergic drug rash  Drug-induced hyperglycemia  CHF exacerbation  Hyperglycemia induced by steroids  CAD   History of Present Illness  12/18/2012 1st pulmonary ov/ Angelmarie Ponzo cc doe s 02 up to half a mile s stopping and overall using 02 mostly evening before bed on advair 500/50 q am only and does not know how to use it correctly. Not needing any daytime saba but generally confused on how to use meds.  >change to Advair 250/50     03/01/2013 f/u ov/Reva Pinkley cc confused with meds/ med calendar using advair 500 and confused with how to use hfa albuterol but overall doing better rec Advair 250/50 one twice daily = Plan A Only use your albuterol (ventolin = Plan B)   Please see patient coordinator before you leave today  to schedule portable 02      09/15/2013 f/u ov/Jiyan Walkowski re: copd/02 dep Chief Complaint  Patient presents with  . Follow-up    Pt states his breathing is unchanged since the last visit. No new co's today. Usinf albuterol HFA approx 3 times per wk.  not really limited from desired activities rec Stop carvedilol (coreg) and substitute with bisoprolol 5 mg  One daily  Continue symbicort 160 Take 2 puffs first thing in am and then another 2 puffs about 12 hours later Remember don't leave home without your rescue inhaler (ventolin) Rec:  02 2lpm sleeping and walking outside the house    03/15/2016  f/u ov/Hayslee Casebolt re:  Copd II on Maint rx symb 160 2bid / no PC whereas had been using [facility] Chief Complaint  Patient presents with  . Acute Visit    Pt c/o swelling in his elbows "for a while"- occ feels painful and "puffs up". He does not have PCP yet.   doe chronic = MMRC2 = can't walk a nl pace on a flat grade s sob rec Plan A = Automatic = Symbicort 160 Take 2 puffs first thing in am and then another 2 puffs about 12 hours later.  Plan B = Backup Only use your albuterol Proair)  as a rescue medication  Plan C = Crisis - only use your albuterol nebulizer if you first try Plan B  Keep your previous appt to see me Add needs walking sats next ov    10/03/2016  f/u ov/Jalayah Gutridge re:  COPD II  Sym 160 2bid / 02 at hs only x 2 years Chief Complaint  Patient presents with  . Follow-up    Breathing is overall doing well.  He only uses albuterol once per wk on average.  doe still  =MMRC2     No obvious day to day or daytime variability or assoc excess/ purulent sputum or mucus plugs or hemoptysis or cp or chest tightness, subjective wheeze or overt sinus or hb symptoms. No unusual exp hx or h/o childhood pna/ asthma or knowledge of premature birth.  Sleeping ok without nocturnal  or early am exacerbation  of respiratory  c/o's or need for noct saba. Also denies any obvious fluctuation of symptoms with weather or environmental changes or other aggravating or alleviating factors except as outlined above  Current Medications, Allergies, Complete Past Medical History, Past Surgical History, Family History, and Social History were reviewed in [employer] record.  ROS  The following are not active complaints unless bolded sore throat, dysphagia, dental problems, itching, sneezing,  nasal congestion or excess/ purulent secretions, ear ache,   fever, chills, sweats, unintended wt loss, classically pleuritic or exertional cp,  orthopnea pnd or leg swelling, presyncope, palpitations, abdominal pain, anorexia, nausea, vomiting, diarrhea  or change in bowel or bladder habits, change in stools or urine, dysuria,hematuria,  rash, arthralgias, visual complaints, headache, numbness, weakness or ataxia or problems with walking or coordination,   change in mood/affect or memory.           Objective:   Physical Exam  03/19/2013  Wt 208 vs 06/17/2013 209  09/15/2013  206 > 212 01/10/2014 > 07/11/2014  210  > 10/10/2014 209 >210 01/11/2015 > 03/13/2015 212 > 04/20/2015  213>219 06/14/2015 > 12/01/2015   220 > 03/15/2016  220 > /10/03/2016  211    amb bm nad/ vital signs reviewed/ sats 97% on RA on arrival   HEENT mild turbinate edema.  Edentulous,  Oropharynx no thrush or excess pnd or cobblestoning. Edentulous with dentures in place No JVD or cervical adenopathy. Mild accessory muscle hypertrophy. Trachea midline, nl thryroid. Chest was hyperinflated by percussion with diminished breath sounds and moderate increased exp time without wheeze.  Regular rate and rhythm without murmur gallop or rub or increase P2 or edema.  Abd: quite protuberant/ no hsm, nl excursion. Ext warm without cyanosis or clubbing.   MS nl gait, from all major joints                Assessment & Plan:

## 2016-10-04 ENCOUNTER — Telehealth: Payer: Self-pay | Admitting: Endocrinology

## 2016-10-04 ENCOUNTER — Encounter (HOSPITAL_COMMUNITY): Payer: Self-pay

## 2016-10-04 ENCOUNTER — Ambulatory Visit (HOSPITAL_COMMUNITY)
Admission: RE | Admit: 2016-10-04 | Discharge: 2016-10-04 | Disposition: A | Discharge status: Home / Self Care | Payer: Commercial Managed Care - HMO | Source: Ambulatory Visit | Attending: Internal Medicine | Admitting: Internal Medicine

## 2016-10-04 ENCOUNTER — Other Ambulatory Visit (HOSPITAL_BASED_OUTPATIENT_CLINIC_OR_DEPARTMENT_OTHER): Payer: Commercial Managed Care - HMO

## 2016-10-04 DIAGNOSIS — C3431 Malignant neoplasm of lower lobe, right bronchus or lung: Secondary | ICD-10-CM | POA: Insufficient documentation

## 2016-10-04 DIAGNOSIS — I700 Atherosclerosis of aorta: Secondary | ICD-10-CM | POA: Insufficient documentation

## 2016-10-04 DIAGNOSIS — R911 Solitary pulmonary nodule: Secondary | ICD-10-CM | POA: Diagnosis not present

## 2016-10-04 DIAGNOSIS — I2510 Atherosclerotic heart disease of native coronary artery without angina pectoris: Secondary | ICD-10-CM | POA: Insufficient documentation

## 2016-10-04 DIAGNOSIS — N200 Calculus of kidney: Secondary | ICD-10-CM | POA: Diagnosis not present

## 2016-10-04 DIAGNOSIS — J439 Emphysema, unspecified: Secondary | ICD-10-CM | POA: Insufficient documentation

## 2016-10-04 LAB — CBC WITH DIFFERENTIAL/PLATELET
BASO%: 0.9 % (ref 0.0–2.0)
BASOS ABS: 0.1 10*3/uL (ref 0.0–0.1)
EOS%: 2 % (ref 0.0–7.0)
Eosinophils Absolute: 0.1 10*3/uL (ref 0.0–0.5)
HEMATOCRIT: 41 % (ref 38.4–49.9)
HGB: 13.2 g/dL (ref 13.0–17.1)
LYMPH#: 1.1 10*3/uL (ref 0.9–3.3)
LYMPH%: 15.1 % (ref 14.0–49.0)
MCH: 31.6 pg (ref 27.2–33.4)
MCHC: 32.3 g/dL (ref 32.0–36.0)
MCV: 97.7 fL (ref 79.3–98.0)
MONO#: 0.9 10*3/uL (ref 0.1–0.9)
MONO%: 12.4 % (ref 0.0–14.0)
NEUT#: 5.1 10*3/uL (ref 1.5–6.5)
NEUT%: 69.6 % (ref 39.0–75.0)
Platelets: 212 10*3/uL (ref 140–400)
RBC: 4.2 10*6/uL (ref 4.20–5.82)
RDW: 13.8 % (ref 11.0–14.6)
WBC: 7.3 10*3/uL (ref 4.0–10.3)

## 2016-10-04 LAB — COMPREHENSIVE METABOLIC PANEL
ALT: 14 U/L (ref 0–55)
AST: 16 U/L (ref 5–34)
Albumin: 3.8 g/dL (ref 3.5–5.0)
Alkaline Phosphatase: 119 U/L (ref 40–150)
Anion Gap: 12 mEq/L — ABNORMAL HIGH (ref 3–11)
BUN: 11.6 mg/dL (ref 7.0–26.0)
CHLORIDE: 106 meq/L (ref 98–109)
CO2: 25 meq/L (ref 22–29)
Calcium: 9.5 mg/dL (ref 8.4–10.4)
Creatinine: 1.3 mg/dL (ref 0.7–1.3)
EGFR: 64 mL/min/{1.73_m2} — ABNORMAL LOW (ref >90)
GLUCOSE: 103 mg/dL (ref 70–140)
Potassium: 3.9 mEq/L (ref 3.5–5.1)
SODIUM: 143 meq/L (ref 136–145)
Total Bilirubin: 0.53 mg/dL (ref 0.20–1.20)
Total Protein: 7.9 g/dL (ref 6.4–8.3)

## 2016-10-04 MED ORDER — IOPAMIDOL (ISOVUE-300) INJECTION 61%
75.0000 mL | Freq: Once | INTRAVENOUS | Status: AC | PRN
  Administered 2016-10-04: 75 mL via INTRAVENOUS

## 2016-10-04 NOTE — Telephone Encounter (Signed)
I am listed as PCP in error. Please refer this to PCP

## 2016-10-04 NOTE — Telephone Encounter (Signed)
Patient need a open referral for labs at cancer center Dx code C [id number] Dr.  Julien Nordmann office. [phone]

## 2016-10-04 NOTE — Assessment & Plan Note (Signed)
-   12/18/2012  Walked RA  2 laps @ 185 ft each stopped due to  desat 87% - 03/19/2013  Walked 2lpm x 3 laps @ 185 ft each stopped due to  End of study, sat 91% - 06/17/2013   Walked RA x one lap @ 185 stopped due to desat to 87 when started second lap -09/15/2013   Walked RA x one lap @ 185 stopped due to sob, no desat  - 04/20/2015  Walked RA x 3 laps @ 185 ft each stopped due to  End of study, no pace, no desat   Rec:  As of 10/03/2016  02 2lpm sleeping  Only

## 2016-10-04 NOTE — Telephone Encounter (Signed)
See message and please advise, Thanks!  

## 2016-10-04 NOTE — Assessment & Plan Note (Signed)
-   PFT's 02/05/13 FEV1  1.46  (50%) ratio 55 p 22% better p B2,  DLCO  61 corrects to 81% -  04/20/2015  Walked RA x 3 laps @ 185 ft each stopped due to  End study, no sob, nl pace  - Spirometry 04/20/2015 >  FEV1  1.21 (48%) ratio 58 before am rx   10/03/2016  After extensive coaching HFA effectiveness =    90% from baseline 75%   Despite poor hfa he's relatively well compensated at this point and does not need regular f/u in pulmonary clinic.  I had an extended final summary discussion with the patient reviewing all relevant studies completed to date and  lasting 15 to 20 minutes of a 25 minute visit on the following issues:    Each maintenance medication was reviewed in detail including most importantly the difference between maintenance and as needed and under what circumstances the prns are to be used.  Please see instructions for details which were reviewed in writing and the patient given a copy.   Pulmonary f/u is prn

## 2016-10-08 ENCOUNTER — Other Ambulatory Visit: Payer: Self-pay | Admitting: Internal Medicine

## 2016-10-08 NOTE — Telephone Encounter (Signed)
Requested a call back from the patient to discuss.

## 2016-10-09 NOTE — Telephone Encounter (Signed)
Patient called and left a voicemail advising patient to please contact PCP for this referral.

## 2016-10-14 ENCOUNTER — Other Ambulatory Visit: Payer: Self-pay | Admitting: Internal Medicine

## 2016-10-15 ENCOUNTER — Telehealth: Payer: Self-pay | Admitting: Internal Medicine

## 2016-10-15 MED ORDER — COLCHICINE 0.6 MG PO TABS: 0.6000 mg | ORAL_TABLET | Freq: Every day | ORAL | 0 refills | Status: DC

## 2016-10-15 MED ORDER — CLONIDINE HCL 0.1 MG PO TABS: 0.1000 mg | ORAL_TABLET | Freq: Two times a day (BID) | ORAL | 0 refills | Status: DC

## 2016-10-15 MED ORDER — FUROSEMIDE 20 MG PO TABS: 20.0000 mg | ORAL_TABLET | Freq: Every day | ORAL | 0 refills | Status: DC

## 2016-10-15 NOTE — Telephone Encounter (Signed)
Refills sent to pharmacy, pharmacy aware.  Nothing further needed.

## 2016-10-17 ENCOUNTER — Other Ambulatory Visit: Payer: Self-pay | Admitting: Internal Medicine

## 2016-10-18 ENCOUNTER — Telehealth (HOSPITAL_COMMUNITY): Payer: Self-pay | Admitting: Vascular Surgery

## 2016-10-18 ENCOUNTER — Encounter (HOSPITAL_COMMUNITY): Payer: Self-pay | Admitting: Vascular Surgery

## 2016-10-18 NOTE — Telephone Encounter (Signed)
Called pt several times, no response will send letter

## 2016-10-29 ENCOUNTER — Other Ambulatory Visit: Payer: Self-pay | Admitting: Internal Medicine

## 2016-11-01 ENCOUNTER — Other Ambulatory Visit: Payer: Self-pay | Admitting: Internal Medicine

## 2016-11-01 NOTE — Telephone Encounter (Signed)
Daughter (lynette-[phone]) called about medication (aldactone 25 mg). Pharmacy is waiting for authorization.Marland KitchenMarland KitchenMearl Latin

## 2016-11-13 ENCOUNTER — Other Ambulatory Visit: Payer: Self-pay | Admitting: Internal Medicine

## 2016-11-14 MED ORDER — BUDESONIDE-FORMOTEROL FUMARATE 160-4.5 MCG/ACT IN AERO: INHALATION_SPRAY | RESPIRATORY_TRACT | 5 refills | Status: DC

## 2016-12-12 ENCOUNTER — Telehealth: Payer: Self-pay | Admitting: Internal Medicine

## 2016-12-12 NOTE — Telephone Encounter (Signed)
If established with PC deny If not established with PC but knows date of ov to establish give enough until then If not established and has no future date with PC then refer to [facility] and give enough until then

## 2016-12-12 NOTE — Telephone Encounter (Signed)
lmtcb x1 for pt. 

## 2016-12-12 NOTE — Telephone Encounter (Signed)
Pt was last seen by MW on 09/2016 MW, please advise if ok to send in these refills for this pt.  Thanks  Allergies  Allergen Reactions  . Levaquin [Levofloxacin In D5w]     Pt states he is not allergic to medication, last took it about 8 years ago  . Lisinopril Swelling

## 2016-12-13 DIAGNOSIS — E785 Hyperlipidemia, unspecified: Secondary | ICD-10-CM | POA: Diagnosis not present

## 2016-12-13 DIAGNOSIS — H6120 Impacted cerumen, unspecified ear: Secondary | ICD-10-CM | POA: Diagnosis not present

## 2016-12-13 DIAGNOSIS — J449 Chronic obstructive pulmonary disease, unspecified: Secondary | ICD-10-CM | POA: Diagnosis not present

## 2016-12-13 DIAGNOSIS — I10 Essential (primary) hypertension: Secondary | ICD-10-CM | POA: Diagnosis not present

## 2016-12-17 MED ORDER — COLCHICINE 0.6 MG PO TABS: 0.6000 mg | ORAL_TABLET | Freq: Every day | ORAL | 0 refills | Status: DC

## 2016-12-17 MED ORDER — FUROSEMIDE 20 MG PO TABS: 20.0000 mg | ORAL_TABLET | Freq: Every day | ORAL | 0 refills | Status: DC

## 2016-12-17 MED ORDER — SPIRONOLACTONE 25 MG PO TABS: ORAL_TABLET | ORAL | 0 refills | Status: DC

## 2016-12-17 MED ORDER — CLONIDINE HCL 0.1 MG PO TABS: 0.1000 mg | ORAL_TABLET | Freq: Two times a day (BID) | ORAL | 0 refills | Status: DC

## 2016-12-17 NOTE — Telephone Encounter (Signed)
Called and lm on machine to make the pt aware of refill of meds this time--will need to get these filled by his PCP next time. Nothing further is needed

## 2016-12-17 NOTE — Telephone Encounter (Signed)
Pt's daughter Willette Cluster returned call Pt is already established with a PCP with [facility] and [facility] is reportedly aware of this Pt's daughter also asked when pt is due back for appt with MW - advised last ov was 09/2016 where he was doing very well and MW said that pt can follow up PRN  [facility] and spoke with Tiffany Discussed the above with her Rx's had actually been filled today x1 - informed Tiffany that these need to be cancelled and the med list

## 2017-01-10 ENCOUNTER — Other Ambulatory Visit: Payer: Self-pay | Admitting: Internal Medicine

## 2017-03-24 ENCOUNTER — Ambulatory Visit (INDEPENDENT_AMBULATORY_CARE_PROVIDER_SITE_OTHER): Payer: Medicare Other | Admitting: Internal Medicine

## 2017-03-24 ENCOUNTER — Encounter: Payer: Self-pay | Admitting: Internal Medicine

## 2017-03-24 VITALS — BP 142/70 | HR 91 | Ht 65.5 in | Wt 212.0 lb

## 2017-03-24 DIAGNOSIS — J449 Chronic obstructive pulmonary disease, unspecified: Secondary | ICD-10-CM | POA: Diagnosis not present

## 2017-03-24 DIAGNOSIS — J9611 Chronic respiratory failure with hypoxia: Secondary | ICD-10-CM

## 2017-03-24 MED ORDER — BUDESONIDE-FORMOTEROL FUMARATE 160-4.5 MCG/ACT IN AERO: INHALATION_SPRAY | RESPIRATORY_TRACT | 0 refills | Status: DC

## 2017-03-24 NOTE — Assessment & Plan Note (Signed)
-   12/18/2012  Walked RA  2 laps @ 185 ft each stopped due to  desat 87% - 03/19/2013  Walked 2lpm x 3 laps @ 185 ft each stopped due to  End of study, sat 91% - 06/17/2013   Walked RA x one lap @ 185 stopped due to desat to 87 when started second lap -09/15/2013   Walked RA x one lap @ 185 stopped due to sob, no desat  - 04/20/2015  Walked RA x 3 laps @ 185 ft each stopped due to  End of study, no pace, no desat   Rec:  As of 03/24/2017  02 2lpm sleeping  Only

## 2017-03-24 NOTE — Progress Notes (Signed)
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 Not needing any daytime saba but generally confused on how to use meds.  >change to Advair 250/50     03/01/2013 f/u ov/John [name] cc confused with meds/ med calendar using advair 500 and confused with how to use hfa albuterol but overall doing better rec Advair 250/50 one twice daily = Plan A Only use your albuterol (ventolin = Plan B)   Please see patient coordinator before you leave today  to schedule portable 02      09/15/2013 f/u ov/John [name] re: copd/02 dep Chief Complaint  Patient presents with  . Follow-up    Pt states his breathing is unchanged since the last visit. No new co's today. Usinf albuterol HFA approx 3 times per wk.  not really limited from desired activities rec Stop carvedilol (coreg) and substitute with bisoprolol 5 mg  One daily  Continue symbicort 160 Take 2 puffs first thing in am and then another 2 puffs about 12 hours later Remember don't leave home without your rescue inhaler (ventolin) Rec:  02 2lpm sleeping and walking outside the house    03/15/2016  f/u ov/John [name] re:  Copd II on Maint rx symb 160 2bid / no PC whereas had been using [facility] Chief Complaint  Patient presents with  . Acute Visit    Pt c/o swelling in his elbows "for a while"- occ feels painful and "puffs up". He does not have PCP yet.   doe chronic = MMRC2 = can't walk a nl pace on a flat grade s sob rec Plan A = Automatic = Symbicort 160 Take 2 puffs first thing in am and then another 2 puffs about 12 hours later.  Plan B = Backup Only use your albuterol Proair)  as a rescue medication  Plan C = Crisis - only use your albuterol nebulizer if you first try Plan B  Keep your previous appt to see me Add needs walking sats next ov    10/03/2016  f/u ov/John [name] re:  COPD II  Sym 160 2bid / 02 at hs only x 2 years Chief Complaint  Patient presents with  . Follow-up    Breathing is overall doing well.  He only uses albuterol once per wk on average.  doe still  =MMRC2   rec Plan A = Automatic = Symbicort 160 Take 2 puffs first thing in am and then another 2 puffs about 12 hours later.  Plan B = Backup Only use your albuterol Proair)  as a rescue medication Plan C = Crisis - only use your albuterol nebulizer if you first try Plan B and it fails to help > ok to use the nebulizer up to every 4 hours but if start needing it regularly call for immediate appointment   03/24/2017  f/u ov/John [name] re: copd  GOLD II / only rx now is 02 2lpm hs  Chief Complaint  Patient presents with  . Follow-up    Pt states his breathing has been worse since ran out of symbicort and albuterol inhaler 3 months ago.   still says his doe = MMRC2 = can't walk a nl pace on a flat grade s sob but does fine slow and flat eg  and not using any saba but has neb for prn use   No obvious day to day or daytime variability or assoc excess/ purulent sputum or mucus plugs or hemoptysis or cp or chest tightness, subjective wheeze or overt sinus or hb symptoms. No unusual exp hx or h/o childhood pna/ asthma or knowledge of premature birth.  Sleeping ok without nocturnal  or early am exacerbation  of respiratory  c/o's or need for noct saba. Also denies any obvious fluctuation of symptoms with weather or environmental changes or other aggravating or alleviating factors  except as outlined above   Current Medications, Allergies, Complete Past Medical History, Past Surgical History, Family History, and Social History were reviewed in [employer] record.  ROS  The following are not active complaints unless bolded sore throat, dysphagia, dental problems, itching, sneezing,  nasal congestion or excess/ purulent secretions, ear ache,   fever, chills, sweats, unintended wt loss, classically pleuritic or exertional cp,  orthopnea pnd or leg swelling, presyncope, palpitations, abdominal pain, anorexia, nausea, vomiting, diarrhea  or change in bowel or bladder habits, change in stools or urine, dysuria,hematuria,  rash, arthralgias/tophaceous gout , visual complaints, headache, numbness, weakness or ataxia or problems with walking or coordination,  change in mood/affect or memory.           Objective:   Physical Exam  03/19/2013  Wt 208 vs 06/17/2013 209  09/15/2013  206 > 212 01/10/2014 > 07/11/2014  210  > 10/10/2014 209 >210 01/11/2015 > 03/13/2015 212 > 04/20/2015  213>219 06/14/2015 > 12/01/2015   220 > 03/15/2016  220 > /10/03/2016  211 > 03/24/2017   212      amb bm nad/ vital signs reviewed/ sats 97% on RA on arrival    HEENT: nl dentition, turbinates bilaterally, and oropharynx. Nl external ear canals without cough reflex   NECK :  without JVD/Nodes/TM/ nl carotid upstrokes bilaterally   LUNGS: no acc muscle use, moderately barrel chested / insp and exp rhonchi bilaterally    CV:  RRR  no s3 or murmur or increase in P2, and no edema   ABD:  soft and nontender with nl inspiratory excursion in the supine position. No bruits or organomegaly appreciated, bowel sounds nl  MS:  Nl gait/ ext warm s calf tenderness, cyanosis or clubbing Large cyst L elbow tender to touch and slt warm c/w olecranon bursitis or tophaceous gout    SKIN: warm and dry without lesions    NEURO:  alert, approp, nl sensorium with  no motor or cerebellar deficits  apparent.                     Assessment & Plan:

## 2017-03-24 NOTE — Assessment & Plan Note (Signed)
Complicated by hbp/ hyperlipidemia   Body mass index is 34.74 kg/m.  trending up slightly  Lab Results  Component Value Date   TSH 1.241 02/22/2012     Contributing to gerd risk/ doe/reviewed the need and the process to achieve and maintain neg calorie balance > defer f/u primary care including intermittently monitoring thyroid status

## 2017-03-24 NOTE — Patient Instructions (Signed)
If breathing gets worse > symbicort 160 up to 2 puffs every 12 hours as needed    If you are satisfied with your treatment plan,  let your doctor know and he/she can either refill your medications or you can return here when your prescription runs out.     If in any way you are not 100% satisfied,  please tell us.  If 100% better, tell your friends!  Pulmonary follow up is as needed

## 2017-03-24 NOTE — Assessment & Plan Note (Signed)
-   PFT's 02/05/13 FEV1  1.46  (50%) ratio 55 p 22% better p B2,  DLCO  61 corrects to 81% -  04/20/2015  Walked RA x 3 laps @ 185 ft each stopped due to  End study, no sob, nl pace  - Spirometry 04/20/2015 >  FEV1  1.21 (48%) ratio 58 before am rx  03/24/2017  After extensive coaching HFA effectiveness =    90% from baseline 75%   Despite non-adherence related to cost of meds/ access to care he remains relatively well compensated on minimal meds > As I explained to this patient in detail:  although there certainly is at least moderatecopd present, it may not be clinically relevant:   it does not appear to be limiting activity tolerance any more than a set of worn tires limits someone from driving a car  around a parking lot.  A new set of Michelins might look good but would have no perceived impact on the performance of the car and would not be worth the cost and there are no generics in this market other than saba neb which he has access to but doesn't use much at all   I had an extended final summary discussion with the patient and daughter  reviewing this conscept and  all relevant studies completed to date and  lasting 15 to 20 minutes of a 25 minute visit on the following issues:     I don't recommend aggressive pulmonary rx at this point unless limiting symptoms arise or acute exacerbations become as issue, neither of which is the case now.  I asked the patient to contact this office at any time in the future should either of these problems arise.    Pulmonary f/u can be prn   Each maintenance medication was reviewed in detail including most importantly the difference between maintenance and as needed and under what circumstances the prns are to be used.  Please see AVS for specific  Instructions which are unique to this visit and I personally typed out  which were reviewed in detail in writing with the patient and a copy provided.

## 2017-03-25 ENCOUNTER — Other Ambulatory Visit: Payer: Self-pay | Admitting: Internal Medicine

## 2017-04-03 ENCOUNTER — Emergency Department (HOSPITAL_COMMUNITY): Payer: Medicare Other

## 2017-04-03 ENCOUNTER — Emergency Department (HOSPITAL_COMMUNITY)
Admission: EM | Admit: 2017-04-03 | Discharge: 2017-04-04 | Disposition: A | Discharge status: Home / Self Care | Payer: Medicare Other | Attending: Emergency Medicine | Admitting: Emergency Medicine

## 2017-04-03 ENCOUNTER — Encounter (HOSPITAL_COMMUNITY): Payer: Self-pay

## 2017-04-03 DIAGNOSIS — K8020 Calculus of gallbladder without cholecystitis without obstruction: Secondary | ICD-10-CM | POA: Diagnosis not present

## 2017-04-03 DIAGNOSIS — I5023 Acute on chronic systolic (congestive) heart failure: Secondary | ICD-10-CM | POA: Diagnosis not present

## 2017-04-03 DIAGNOSIS — I252 Old myocardial infarction: Secondary | ICD-10-CM | POA: Diagnosis not present

## 2017-04-03 DIAGNOSIS — Z85118 Personal history of other malignant neoplasm of bronchus and lung: Secondary | ICD-10-CM | POA: Insufficient documentation

## 2017-04-03 DIAGNOSIS — R1011 Right upper quadrant pain: Secondary | ICD-10-CM

## 2017-04-03 DIAGNOSIS — Z87891 Personal history of nicotine dependence: Secondary | ICD-10-CM | POA: Diagnosis not present

## 2017-04-03 DIAGNOSIS — I110 Hypertensive heart disease with heart failure: Secondary | ICD-10-CM | POA: Diagnosis not present

## 2017-04-03 DIAGNOSIS — J449 Chronic obstructive pulmonary disease, unspecified: Secondary | ICD-10-CM | POA: Diagnosis not present

## 2017-04-03 DIAGNOSIS — R058 Other specified cough: Secondary | ICD-10-CM

## 2017-04-03 DIAGNOSIS — R109 Unspecified abdominal pain: Secondary | ICD-10-CM | POA: Diagnosis present

## 2017-04-03 DIAGNOSIS — R05 Cough: Secondary | ICD-10-CM

## 2017-04-03 LAB — COMPREHENSIVE METABOLIC PANEL
ALK PHOS: 85 U/L (ref 38–126)
ALT: 15 U/L — AB (ref 17–63)
AST: 24 U/L (ref 15–41)
Albumin: 3.9 g/dL (ref 3.5–5.0)
Anion gap: 8 (ref 5–15)
BILIRUBIN TOTAL: 0.6 mg/dL (ref 0.3–1.2)
BUN: 19 mg/dL (ref 6–20)
CALCIUM: 9 mg/dL (ref 8.9–10.3)
CO2: 26 mmol/L (ref 22–32)
CREATININE: 1.25 mg/dL — AB (ref 0.61–1.24)
Chloride: 102 mmol/L (ref 101–111)
GFR, EST NON AFRICAN AMERICAN: 56 mL/min — AB (ref >60)
Glucose, Bld: 139 mg/dL — ABNORMAL HIGH (ref 65–99)
Potassium: 3.8 mmol/L (ref 3.5–5.1)
Sodium: 136 mmol/L (ref 135–145)
Total Protein: 8.2 g/dL — ABNORMAL HIGH (ref 6.5–8.1)

## 2017-04-03 LAB — CBC WITH DIFFERENTIAL/PLATELET
BASOS ABS: 0 10*3/uL (ref 0.0–0.1)
Basophils Relative: 0 %
Eosinophils Absolute: 0.1 10*3/uL (ref 0.0–0.7)
Eosinophils Relative: 1 %
HEMATOCRIT: 35.9 % — AB (ref 39.0–52.0)
HEMOGLOBIN: 11.9 g/dL — AB (ref 13.0–17.0)
Lymphocytes Relative: 11 %
Lymphs Abs: 1.1 10*3/uL (ref 0.7–4.0)
MCH: 30.8 pg (ref 26.0–34.0)
MCHC: 33.1 g/dL (ref 30.0–36.0)
MCV: 93 fL (ref 78.0–100.0)
Monocytes Absolute: 1.1 10*3/uL — ABNORMAL HIGH (ref 0.1–1.0)
Monocytes Relative: 11 %
NEUTROS ABS: 7.7 10*3/uL (ref 1.7–7.7)
NEUTROS PCT: 77 %
Platelets: 292 10*3/uL (ref 150–400)
RBC: 3.86 MIL/uL — AB (ref 4.22–5.81)
RDW: 13.4 % (ref 11.5–15.5)
WBC: 10.1 10*3/uL (ref 4.0–10.5)

## 2017-04-03 LAB — URINALYSIS, ROUTINE W REFLEX MICROSCOPIC
Bilirubin Urine: NEGATIVE
Glucose, UA: NEGATIVE mg/dL
Hgb urine dipstick: NEGATIVE
Ketones, ur: NEGATIVE mg/dL
LEUKOCYTES UA: NEGATIVE
NITRITE: NEGATIVE
PH: 5 (ref 5.0–8.0)
Protein, ur: NEGATIVE mg/dL
Specific Gravity, Urine: 1.016 (ref 1.005–1.030)

## 2017-04-03 LAB — LIPASE, BLOOD: LIPASE: 25 U/L (ref 11–51)

## 2017-04-03 NOTE — ED Triage Notes (Signed)
Pt states stomach pain started last night-geenralized and cough for several days, Denies N/V/D and fever

## 2017-04-03 NOTE — ED Notes (Signed)
[place]- CE[facility]- [phone]

## 2017-04-03 NOTE — ED Provider Notes (Signed)
[facility] DEPT Provider Note   CSN: [id number] Arrival date & time: 04/03/17  1723     History   Chief Complaint Chief Complaint  Patient presents with  . Abdominal Pain  . Cough    HPI John [name] is a 71 y.o. male.  Patient with pmh of HTN, COPD, presents with 2 month history of productive cough and SOB and 2 month history of abdominal pain. States the abd pain comes and goes and moves in location. Not associated with diarrhea, constipation or vomiting and unrelated to food intake. Prior surgical history includes appendectomy many years ago. Denies fever or appetite changes. Patient states his cough has been productive with yellow/white sputum and he has been experiencing SOB on and off with and without physical activity. Denies chest pain, hemoptysis or leg swelling.  Reports compliance with medication. Chart review shows last COPD exacerbation was in 2013.      Past Medical History:  Diagnosis Date  . CHF (congestive heart failure) ([place])   . COPD (chronic obstructive pulmonary disease) ([place])   . Gout   . Hyperlipemia   . Hypertension   . 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 MI (myocardial infarction)   . Pulmonary hypertension     Patient Active Problem List   Diagnosis Date Noted  . Bursitis of elbow 03/15/2016  . Morbid obesity ([place]) 12/03/2015  . Primary cancer of right lower lobe of lung ([place]) 06/14/2015  . Solitary pulmonary nodule 03/13/2015  . RVF (right ventricular failure) ([place]) 11/04/2014  . COPD II/III with reversibility  12/19/2012  . Chronic respiratory failure with hypoxia ([place]) 12/18/2012  . Drug-induced hyperglycemia 12/08/2012  . Allergic drug rash 12/06/2012  . Pulmonary HTN ([place]) 03/18/2012  . Abnormal echocardiogram 03/18/2012  . LV dysfunction 03/18/2012  . COPD exacerbation ([place]) 02/22/2012  . CHF (congestive heart failure) ([place]) 02/22/2012  . HTN (hypertension) 02/22/2012  . CAD (coronary  artery disease) 02/22/2012  . Acute on chronic systolic heart failure ([place]) 02/22/2012    Past Surgical History:  Procedure Laterality Date  . APPENDECTOMY    . LUNG BIOPSY         Home Medications    Prior to Admission medications   Medication Sig Start Date End Date Taking? Authorizing Provider  albuterol (PROAIR HFA) 108 (90 BASE) MCG/ACT inhaler INHALE 2 PUFFS BY MOUTH EVERY 4 HOURS AS NEEDED FOR WHEEZING Patient not taking: Reported on 03/24/2017 03/13/15   Tanda Rockers, MD  allopurinol (ZYLOPRIM) 100 MG tablet Take 1 tablet (100 mg total) by mouth daily. 09/26/16   Tanda Rockers, MD  amLODipine (NORVASC) 10 MG tablet Take 1 tablet (10 mg total) by mouth daily. 09/26/16   Tanda Rockers, MD  atorvastatin (LIPITOR) 20 MG tablet Take 1 tablet (20 mg total) by mouth daily. 09/26/16   Tanda Rockers, MD  bisoprolol (ZEBETA) 5 MG tablet Take 1 tablet (5 mg total) by mouth daily. Patient not taking: Reported on 03/24/2017 09/26/16   Tanda Rockers, MD  budesonide-formoterol (SYMBICORT) 160-4.5 MCG/ACT inhaler INHALE 2 PUFFS BY MOUTH EVERY 12 HOURS (FIRST THING IN THE MORNING THEN 12 HOURS LATER) 03/24/17   Tanda Rockers, MD  cloNIDine (CATAPRES) 0.1 MG tablet Take 1 tablet (0.1 mg total) by mouth 2 (two) times daily. 12/17/16   Tanda Rockers, MD  fenofibrate (TRICOR) 48 MG tablet Take 1 tablet (48 mg total) by mouth daily. Patient not taking:  Reported on 03/24/2017 09/26/16   Tanda Rockers, MD  furosemide (LASIX) 20 MG tablet Take 1 tablet (20 mg total) by mouth daily. 12/17/16   Tanda Rockers, MD  polyethylene glycol powder (GLYCOLAX/MIRALAX) powder DISSOLVE 17 GRAMS (1 CAPFUL) INTO 8 OUNCES OF LIQUID AND DRINK EVERY DAY Patient not taking: Reported on 03/24/2017 01/12/16   Tanda Rockers, MD  spironolactone (ALDACTONE) 25 MG tablet TAKE 1/2 TABLET BY MOUTH EVERY DAY 12/17/16   Tanda Rockers, MD  sucralfate (CARAFATE) 1 G tablet Take 1 tablet (1 g total) by mouth 4 (four) times daily -   with meals and at bedtime. 5 min before meals for radiation induced esophagitis Patient not taking: Reported on 03/24/2017 07/06/15   Tyler Pita, MD    Family History Family History  Problem Relation Age of Onset  . Heart disease Mother   . Cancer Neg Hx     Social History Social History  Substance Use Topics  . Smoking status: Former Smoker    Packs/day: 1.50    Years: 60.00    Types: Cigarettes    Quit date: 09/29/2012  . Smokeless tobacco: Never Used  . Alcohol use No     Allergies   Levaquin [levofloxacin in d5w] and Lisinopril   Review of Systems Review of Systems  Constitutional: Positive for chills. Negative for appetite change and fever.  HENT: Negative for ear pain, rhinorrhea, sneezing and sore throat.   Eyes: Negative for photophobia and visual disturbance.  Respiratory: Positive for cough (Productive with yellow sputum) and shortness of breath. Negative for chest tightness and wheezing.   Cardiovascular: Negative for chest pain, palpitations and leg swelling.  Gastrointestinal: Positive for abdominal pain. Negative for blood in stool, constipation, diarrhea, nausea and vomiting.  Genitourinary: Negative for dysuria, hematuria and urgency.  Musculoskeletal: Negative for myalgias.  Skin: Negative for rash.  Neurological: Negative for dizziness, weakness, light-headedness and headaches.     Physical Exam Updated Vital Signs BP 127/75 (BP Location: Left Arm)   Pulse 87   Temp 98.3 F (36.8 C) (Oral)   Resp (!) 22   Ht 5' 5.5" (1.664 m)   Wt 80.7 kg   SpO2 93%   BMI 29.17 kg/m   Physical Exam  Constitutional: He appears well-developed and well-nourished. No distress.  HENT:  Head: Normocephalic and atraumatic.  Nose: Nose normal.  Eyes: Conjunctivae and EOM are normal. Right eye exhibits no discharge. Left eye exhibits no discharge. No scleral icterus.  Neck: Normal range of motion. Neck supple.  Cardiovascular: Normal rate, regular rhythm,  normal heart sounds and intact distal pulses.  Exam reveals no gallop and no friction rub.   No murmur heard. Pulmonary/Chest: Effort normal and breath sounds normal. No respiratory distress.  Abdominal: Soft. Bowel sounds are normal. He exhibits distension. There is no tenderness. There is no guarding.  Musculoskeletal: Normal range of motion. He exhibits no edema.  Neurological: He is alert. He exhibits normal muscle tone. Coordination normal.  Skin: Skin is warm and dry. No rash noted.  Psychiatric: He has a normal mood and affect.  Nursing note and vitals reviewed.    ED Treatments / Results  Labs (all labs ordered are listed, but only abnormal results are displayed) Labs Reviewed  COMPREHENSIVE METABOLIC PANEL - Abnormal; Notable for the following:       Result Value   Glucose, Bld 139 (*)    Creatinine, Ser 1.25 (*)    Total Protein 8.2 (*)  ALT 15 (*)    GFR calc non Af Amer 56 (*)    All other components within normal limits  CBC WITH DIFFERENTIAL/PLATELET - Abnormal; Notable for the following:    RBC 3.86 (*)    Hemoglobin 11.9 (*)    HCT 35.9 (*)    Monocytes Absolute 1.1 (*)    All other components within normal limits  LIPASE, BLOOD  URINALYSIS, ROUTINE W REFLEX MICROSCOPIC    EKG  EKG Interpretation None       Radiology Dg Chest Port 1 View  Result Date: 04/03/2017 CLINICAL DATA:  Abdominal pain EXAM: PORTABLE CHEST 1 VIEW COMPARISON:  10/04/2016, 06/05/2015 FINDINGS: There is borderline to mild cardiomegaly. No acute infiltrate or effusion. Spiculated opacities in the right mid and lower lung are similar in appearance compared with CT October 2017 and correspond to history of known lung nodules. No pneumothorax. IMPRESSION: Stable spiculated opacities in the right mid to lower lung, corresponding to previously noted CT lung masses/nodules. No acute infiltrate. Borderline to mild cardiomegaly. Electronically Signed   By: Donavan Foil M.D.   On: 04/03/2017  20:34    Procedures Procedures (including critical care time)  Medications Ordered in ED Medications - No data to display   Initial Impression / Assessment and Plan / ED Course  I have reviewed the triage vital signs and the nursing notes.  Pertinent labs & imaging results that were available during my care of the patient were reviewed by me and considered in my medical decision making (see chart for details).     Patient's history and symptoms concerning for COPD exacerbation vs. Viral URI vs. Gallstones. CBC, CMP, lipase were unremarkable. UA showed no evidence of infection. CXR showed no acute abnormalities or changes from baseline (chronic opacities that were seen on previous CT are still seen). RUQ ordered due to some tenderness in this area. This was pending at end of shift. If negative, will give rx for PPI, carafate for symptomatic relief. Care turned over to [facility], PA-C.  Final Clinical Impressions(s) / ED Diagnoses   Final diagnoses:  RUQ abdominal pain    New Prescriptions New Prescriptions   No medications on file     Lulu Riding 04/03/17 [place], MD 04/04/17 [phone]

## 2017-04-03 NOTE — ED Notes (Addendum)
PT HAS BED BUGS- CLEANED BY [facility]. CLOTHES IN BIOBAG. EMS CONFIRMS BED BUGS WERE ALIVE HOWEVER DID NOT KEEP BUG.

## 2017-04-03 NOTE — ED Triage Notes (Signed)
Per EMS-states abdominal pain that started this am and got progressively worse throughout the day-came in from mowing lawn and pain became intense-EMS states while in route  She took off patient's clothing and noticed bugs crawling on him-once patient arrived staff removed clothes and washed/cleaned patient

## 2017-04-03 NOTE — ED Notes (Signed)
Bed: WA12 Expected date:  Expected time:  Means of arrival:  Comments: EMS abdominal pain 

## 2017-04-04 MED ORDER — HYDROCODONE-ACETAMINOPHEN 5-325 MG PO TABS
0.5000 | ORAL_TABLET | Freq: Once | ORAL | Status: AC
  Administered 2017-04-04: 0.5 via ORAL
  Filled 2017-04-04: qty 1

## 2017-04-04 MED ORDER — TRAMADOL HCL 50 MG PO TABS: 50.0000 mg | ORAL_TABLET | Freq: Four times a day (QID) | ORAL | 0 refills | Status: DC | PRN

## 2017-04-04 NOTE — Discharge Instructions (Signed)
Please call the surgery clinic listed in the morning to schedule a follow-up appointment. Pain medication only as needed for severe pain- This can make you very drowsy - please do not drink alcohol, operate heavy machinery or drive on this medication Please seek immediate care if you develop any of the following symptoms: The pain does not go away.  You have a fever.  You keep throwing up and can't keep fluids down. You pass bloody or black tarry stools.  There is bright red blood in the stool.  There is rectal pain.  You do not seem to be getting better.  You have any questions or concerns.

## 2017-04-04 NOTE — ED Provider Notes (Signed)
Care assumed from previous provider PA [place]. Please see note for further details. Case discussed, plan agreed upon. Will follow up on RUQ ultrasound and dispo appropriately.    Critical quadrant ultrasound with cholelithiasis without evidence of acute cholecystitis. Patient reevaluated and informed of results.  Gen: afebrile, VSS, NAD HEENT: EOMI, MMM Resp: no resp distress CV: RRR, no MRG Abd: soft, nondistended, tenderness to palpation of the epigastrium and right upper quadrant with negative Murphy's sign. No peritoneal signs. MsK: moving all extremities well Neuro: A&O x4  Patient tolerating PO. Patient and family at bedside feel comfortable with discharge to home. General surgery follow-up. Patient and family at bedside encouraged to call surgery clinic this morning to schedule follow up appointment. Spoke at length about return precautions including vomiting, worsening pain, blood in the stool, new or worsening symptoms any additional concerns. All questions were answered.   John [name] [facility] Tahjay Binion, PA-C 04/04/17 [phone]

## 2017-04-21 ENCOUNTER — Telehealth: Payer: Self-pay | Admitting: Internal Medicine

## 2017-04-21 NOTE — Telephone Encounter (Signed)
Spoke with Melissa and informed her that [facility] is not this pt's pcp and that they can send the request to them. She had no further questions at this time.

## 2017-06-05 ENCOUNTER — Other Ambulatory Visit: Payer: Self-pay | Admitting: Internal Medicine

## 2017-07-30 ENCOUNTER — Other Ambulatory Visit: Payer: Self-pay | Admitting: Internal Medicine

## 2017-08-05 ENCOUNTER — Ambulatory Visit: Payer: Medicare Other | Admitting: Internal Medicine

## 2017-08-25 ENCOUNTER — Other Ambulatory Visit (INDEPENDENT_AMBULATORY_CARE_PROVIDER_SITE_OTHER): Payer: Medicare Other

## 2017-08-25 ENCOUNTER — Ambulatory Visit (INDEPENDENT_AMBULATORY_CARE_PROVIDER_SITE_OTHER): Payer: Medicare Other | Admitting: Internal Medicine

## 2017-08-25 ENCOUNTER — Encounter: Payer: Self-pay | Admitting: Internal Medicine

## 2017-08-25 VITALS — BP 130/64 | HR 73 | Ht 65.5 in | Wt 208.6 lb

## 2017-08-25 DIAGNOSIS — J449 Chronic obstructive pulmonary disease, unspecified: Secondary | ICD-10-CM

## 2017-08-25 DIAGNOSIS — E669 Obesity, unspecified: Secondary | ICD-10-CM | POA: Diagnosis not present

## 2017-08-25 DIAGNOSIS — R0609 Other forms of dyspnea: Secondary | ICD-10-CM | POA: Diagnosis not present

## 2017-08-25 LAB — CBC WITH DIFFERENTIAL/PLATELET
BASOS PCT: 0.6 % (ref 0.0–3.0)
Basophils Absolute: 0.1 10*3/uL (ref 0.0–0.1)
EOS ABS: 0.2 10*3/uL (ref 0.0–0.7)
Eosinophils Relative: 2.5 % (ref 0.0–5.0)
HCT: 39.2 % (ref 39.0–52.0)
HEMOGLOBIN: 12.6 g/dL — AB (ref 13.0–17.0)
Lymphocytes Relative: 16.6 % (ref 12.0–46.0)
Lymphs Abs: 1.5 10*3/uL (ref 0.7–4.0)
MCHC: 32.3 g/dL (ref 30.0–36.0)
MCV: 97.2 fl (ref 78.0–100.0)
Monocytes Absolute: 0.8 10*3/uL (ref 0.1–1.0)
Monocytes Relative: 8.8 % (ref 3.0–12.0)
Neutro Abs: 6.6 10*3/uL (ref 1.4–7.7)
Neutrophils Relative %: 71.5 % (ref 43.0–77.0)
Platelets: 254 10*3/uL (ref 150.0–400.0)
RBC: 4.03 Mil/uL — ABNORMAL LOW (ref 4.22–5.81)
RDW: 13.5 % (ref 11.5–15.5)
WBC: 9.2 10*3/uL (ref 4.0–10.5)

## 2017-08-25 LAB — BASIC METABOLIC PANEL
BUN: 18 mg/dL (ref 6–23)
CALCIUM: 9.4 mg/dL (ref 8.4–10.5)
CO2: 27 meq/L (ref 19–32)
CREATININE: 1.19 mg/dL (ref 0.40–1.50)
Chloride: 104 mEq/L (ref 96–112)
GFR: 77.12 mL/min (ref >60.00)
Glucose, Bld: 109 mg/dL — ABNORMAL HIGH (ref 70–99)
Potassium: 3.8 mEq/L (ref 3.5–5.1)
Sodium: 140 mEq/L (ref 135–145)

## 2017-08-25 LAB — BRAIN NATRIURETIC PEPTIDE: Pro B Natriuretic peptide (BNP): 42 pg/mL (ref 0.0–100.0)

## 2017-08-25 LAB — TSH: TSH: 2.31 u[IU]/mL (ref 0.35–4.50)

## 2017-08-25 NOTE — Progress Notes (Signed)
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 Not needing any daytime saba but generally confused on how to use meds.  >change to Advair 250/50     03/01/2013 f/u ov/[name] cc confused with meds/ med calendar using advair 500 and confused with how to use hfa albuterol but overall doing better rec Advair 250/50 one twice daily = Plan A Only use your albuterol (ventolin = Plan B)   Please see patient coordinator before you leave today  to schedule portable 02      09/15/2013 f/u ov/John [name] re: copd/02 dep Chief Complaint  Patient presents with  . Follow-up    Pt states his breathing is unchanged since the last visit. No new co's today. Usinf albuterol HFA approx 3 times per wk.  not really limited from desired activities rec Stop carvedilol (coreg) and substitute with bisoprolol 5 mg  One daily  Continue symbicort 160 Take 2 puffs first thing in am and then another 2 puffs about 12 hours later Remember don't leave home without your rescue inhaler (ventolin) Rec:  02 2lpm sleeping and walking outside the house    03/15/2016  f/u ov/John [name] re:  Copd II on Maint rx symb 160 2bid / no PC whereas had been using [facility] Chief Complaint  Patient presents with  . Acute Visit    Pt c/o swelling in his elbows "for a while"- occ feels painful and "puffs up". He does not have PCP yet.   doe chronic = MMRC2 = can't walk a nl pace on a flat grade s sob rec Plan A = Automatic = Symbicort 160 Take 2 puffs first thing in am and then another 2 puffs about 12 hours later.  Plan B = Backup Only use your albuterol Proair)  as a rescue medication  Plan C = Crisis - only use your albuterol nebulizer if you first try Plan B  Keep your previous appt to see me Add needs walking sats next ov    10/03/2016  f/u ov/John [name] re:  COPD II  Sym 160 2bid / 02 at hs only x 2 years Chief Complaint  Patient presents with  . Follow-up    Breathing is overall doing well.  He only uses albuterol once per wk on average.  doe still  =MMRC2   rec Plan A = Automatic = Symbicort 160 Take 2 puffs first thing in am and then another 2 puffs about 12 hours later.  Plan B = Backup Only use your albuterol Proair)  as a rescue medication Plan C = Crisis - only use your albuterol nebulizer if you first try Plan B and it fails to help > ok to use the nebulizer up to every 4 hours but if start needing it regularly call for immediate appointment   03/24/2017  f/u ov/John [name] re: copd  GOLD II / only rx now is 02 2lpm hs  Chief Complaint  Patient presents with  . Follow-up    Pt states his breathing has been worse since ran out of symbicort and albuterol inhaler 3 months ago.   still says his doe = MMRC2 = can't walk a nl pace on a flat grade s sob but does fine slow and flat eg  and not using any saba but has neb for prn use rec If breathing gets worse > symbicort 160 up to 2 puffs every 12 hours as needed     08/25/2017  f/u ov/John [name] re:  GOLD II copd / on symb 160 2 each am  Chief Complaint  Patient presents with  . Follow-up    Pt states that he has become SOB very easily, mainly on exertion. Denies any cough or CP. Pt does state that he is becoming fatigued easier now than he had.  one month prior to OV started noting  Having trouble getting to bus stop  Was using symb just 2 puff in am and nothing else  and did not increase to 2bid  As rec    No obvious day to day or daytime variability or assoc excess/ purulent sputum or mucus plugs or hemoptysis or cp or chest tightness, subjective wheeze or overt sinus or hb symptoms. No unusual exp hx or h/o childhood pna/ asthma or knowledge of premature birth.  Sleeping ok without nocturnal  or early am exacerbation  of respiratory  c/o's or need for noct saba. Also denies any obvious fluctuation of symptoms with weather or environmental changes or other aggravating or alleviating factors except as outlined above   Current Medications, Allergies, Complete Past Medical History, Past Surgical History, Family History, and Social History were reviewed in [employer] record.  ROS  The following are not active complaints unless bolded sore throat, dysphagia, dental problems, itching, sneezing,  nasal congestion or excess/ purulent secretions, ear ache,   fever, chills, sweats, unintended wt loss, classically pleuritic or exertional cp,  orthopnea pnd or leg swelling, presyncope, palpitations, abdominal pain, anorexia, nausea, vomiting, diarrhea  or change in bowel or bladder habits, change in stools or urine, dysuria,hematuria,  rash, arthralgias, visual complaints, headache, numbness, weakness or ataxia or problems with walking or coordination,  change in mood/affect or memory.                     Objective:   Physical Exam  03/19/2013  Wt 208 vs 06/17/2013 209  09/15/2013  206 > 212 01/10/2014 > 07/11/2014  210  > 10/10/2014 209 >210 01/11/2015 > 03/13/2015 212 > 04/20/2015  213>219 06/14/2015 > 12/01/2015   220 > 03/15/2016  220 > /10/03/2016  211 > 03/24/2017   212 >  08/25/2017  208     amb bm nad/ vital signs reviewed/ sats 98% on RA on arrival    HEENT: nl  turbinates bilaterally, and oropharynx. Nl external ear canals without cough reflex - edentulous    NECK :  without JVD/Nodes/TM/ nl carotid upstrokes bilaterally   LUNGS: no acc  muscle use, moderately barrel chested / distant bs with min wheeze     CV:  RRR  no s3 or murmur or increase in P2, and no edema   ABD:  Very obese soft and nontender with nl inspiratory excursion in the supine position. No bruits or organomegaly appreciated, bowel sounds nl  MS:  Nl gait/ ext warm s calf  tenderness, cyanosis or clubbing    SKIN: warm and dry without lesions    NEURO:  alert, approp, nl sensorium with  no motor or cerebellar deficits apparent.      CXR PA and Lateral:   08/25/2017 :    I personally reviewed images and agree with radiology impression as follows:    did not go to xray as rec  Labs ordered/ reviewed:      Chemistry      Component Value Date/Time   NA 140 08/25/2017 1457   NA 143 10/04/2016 1354   K 3.8 08/25/2017 1457   K 3.9 10/04/2016 1354   CL 104 08/25/2017 1457   CO2 27 08/25/2017 1457   CO2 25 10/04/2016 1354   BUN 18 08/25/2017 1457   BUN 11.6 10/04/2016 1354   CREATININE 1.19 08/25/2017 1457   CREATININE 1.3 10/04/2016 1354      Component Value Date/Time   CALCIUM 9.4 08/25/2017 1457   CALCIUM 9.5 10/04/2016 1354   ALKPHOS 85 04/03/2017 1927   ALKPHOS 119 10/04/2016 1354   AST 24 04/03/2017 1927   AST 16 10/04/2016 1354   ALT 15 (L) 04/03/2017 1927   ALT 14 10/04/2016 1354   BILITOT 0.6 04/03/2017 1927   BILITOT 0.53 10/04/2016 1354        Lab Results  Component Value Date   WBC 9.2 08/25/2017   HGB 12.6 (L) 08/25/2017   HCT 39.2 08/25/2017   MCV 97.2 08/25/2017   PLT 254.0 08/25/2017       Lab Results  Component Value Date   TSH 2.31 08/25/2017     Lab Results  Component Value Date   PROBNP 42.0 08/25/2017                         Assessment & Plan:

## 2017-08-25 NOTE — Patient Instructions (Addendum)
Work on inhaler technique:  relax and gently blow all the way out then take a nice smooth deep breath back in, triggering the inhaler at same time you start breathing in.  Hold for up to 5 seconds if you can. Blow out thru nose. Rinse and gargle with water when done     symbicort 160 Take 2 puffs first thing in am and then another 2 puffs about 12 hours later.   Please remember to go to the lab and x-ray department downstairs in the basement  for your tests - we will call you with the results when they are available.     Please schedule a follow up office visit in 6 weeks, call sooner if needed

## 2017-08-25 NOTE — Progress Notes (Signed)
Spoke with pt and notified of results per Dr. Wert. Pt verbalized understanding and denied any questions. 

## 2017-08-27 NOTE — Assessment & Plan Note (Signed)
No evidence chf/ anemia/ thyroid dz/ renal dz  - see copd

## 2017-08-27 NOTE — Assessment & Plan Note (Signed)
Complicated by hbp/ hyperlipidemia   Body mass index is 34.18 kg/m.  -  trending down slightly/ reinforced  Lab Results  Component Value Date   TSH 2.31 08/25/2017     Contributing to gerd risk/ doe/reviewed the need and the process to achieve and maintain neg calorie balance > defer f/u primary care including intermittently monitoring thyroid status

## 2017-08-27 NOTE — Assessment & Plan Note (Addendum)
-   PFT's 02/05/13 FEV1  1.46  (50%) ratio 55 p 22% better p B2,  DLCO  61 corrects to 81% -  04/20/2015  Walked RA x 3 laps @ 185 ft each stopped due to  End study, no sob, nl pace  - Spirometry 04/20/2015 >  FEV1  1.21 (48%) ratio 58 before am rx   08/25/2017  After extensive coaching HFA effectiveness =    90% from baseline 75%   Continues to struggle with instructions including need for cxr > will try again to optimize rx before changing it in any way   rec symb 160 2bid and f/u in 6 weeks/ sooner if needed   Each maintenance medication was reviewed in detail including most importantly the difference between maintenance and as needed and under what circumstances the prns are to be used.  Please see AVS for specific  Instructions which are unique to this visit and I personally typed out  which were reviewed in detail in writing with the patient and a copy provided.

## 2017-10-06 ENCOUNTER — Ambulatory Visit (INDEPENDENT_AMBULATORY_CARE_PROVIDER_SITE_OTHER): Payer: Medicare Other | Admitting: Internal Medicine

## 2017-10-06 ENCOUNTER — Ambulatory Visit (INDEPENDENT_AMBULATORY_CARE_PROVIDER_SITE_OTHER)
Admission: RE | Admit: 2017-10-06 | Discharge: 2017-10-06 | Disposition: A | Discharge status: Home / Self Care | Payer: Medicare Other | Source: Ambulatory Visit | Attending: Internal Medicine | Admitting: Internal Medicine

## 2017-10-06 ENCOUNTER — Encounter: Payer: Self-pay | Admitting: Internal Medicine

## 2017-10-06 ENCOUNTER — Other Ambulatory Visit: Payer: Medicare Other

## 2017-10-06 VITALS — BP 132/80 | HR 49 | Ht 65.5 in | Wt 207.0 lb

## 2017-10-06 DIAGNOSIS — Z23 Encounter for immunization: Secondary | ICD-10-CM | POA: Diagnosis not present

## 2017-10-06 DIAGNOSIS — J449 Chronic obstructive pulmonary disease, unspecified: Secondary | ICD-10-CM | POA: Diagnosis not present

## 2017-10-06 DIAGNOSIS — C3431 Malignant neoplasm of lower lobe, right bronchus or lung: Secondary | ICD-10-CM

## 2017-10-06 DIAGNOSIS — R0609 Other forms of dyspnea: Secondary | ICD-10-CM | POA: Diagnosis not present

## 2017-10-06 DIAGNOSIS — J9611 Chronic respiratory failure with hypoxia: Secondary | ICD-10-CM

## 2017-10-06 NOTE — Progress Notes (Signed)
Spoke with pt and notified of results per Dr. Wert. Pt verbalized understanding and denied any questions. 

## 2017-10-06 NOTE — Progress Notes (Signed)
Subjective:    Patient ID: John [name], male    DOB: Dec 06, 1946   MRN: [id number]  Brief patient profile:  70 yobm quit smoking 09/2012 with GOLD II copd 01/2013 and  with multiple admits for "aecopd" last one:    History of Present Illness 08/25/2017  f/u ov/Alby Schwabe re:  GOLD II copd / on symb 160 2 each am  Chief Complaint  Patient presents with  . Follow-up    Pt states that he has become SOB very easily, mainly on exertion. Denies any cough or CP. Pt does state that he is becoming fatigued easier now than he had.  one month prior to OV started noting  Having trouble getting to bus stop  Was using symb just 2 puff in am and nothing else and did not increase to 2bid  As rec  rec Work on inhaler technique:   Symbicort 160 Take 2 puffs first thing in am and then another 2 puffs about 12 hours lat   10/06/2017  f/u ov/Loretha Ure re:   GOLD II copd/  symb 2 bid - thoroughly confused with meds and names of docs / 02 hs only  Chief Complaint  Patient presents with  . Follow-up    Breathing is unchanged. He uses his proair 4-5 x per wk.    hfa >  Better technique now yardwork is only thing where he gives out [facility] = can't walk a nl pace on a flat grade s sob but does fine slow and flat eg shopping ok s 02 / walks to [employer] s stopping sev blocks from house  Sleeping fine    No obvious day to day or daytime variability or assoc excess/ purulent sputum or mucus plugs or hemoptysis or cp or chest tightness, subjective wheeze or overt sinus or hb symptoms. No unusual exp hx or h/o childhood pna/ asthma or knowledge of premature birth.  Sleeping ok flat without nocturnal  or early am exacerbation  of respiratory  c/o's or need for noct saba. Also denies any obvious fluctuation of symptoms with weather or environmental changes or other aggravating or alleviating factors except as outlined above   Current Allergies, Complete Past Medical History, Past Surgical History, Family History, and Social  History were reviewed in [employer] record.  ROS  The following are not active complaints unless bolded Hoarseness, sore throat, dysphagia, dental problems, itching, sneezing,  nasal congestion or discharge of excess mucus or purulent secretions, ear ache,   fever, chills, sweats, unintended wt loss or wt gain, classically pleuritic or exertional cp,  orthopnea pnd or leg swelling, presyncope, palpitations, abdominal pain, anorexia, nausea, vomiting, diarrhea  or change in bowel habits or change in bladder habits, change in stools or change in urine, dysuria, hematuria,  rash, arthralgias, visual complaints, headache, numbness, weakness or ataxia or problems with walking or coordination,  change in mood/affect or memory.        Current Meds  Medication Sig  . albuterol (PROAIR HFA) 108 (90 BASE) MCG/ACT inhaler INHALE 2 PUFFS BY MOUTH EVERY 4 HOURS AS NEEDED FOR WHEEZING  . allopurinol (ZYLOPRIM) 100 MG tablet Take 1 tablet (100 mg total) by mouth daily.  Marland Kitchen amLODipine (NORVASC) 10 MG tablet Take 1 tablet (10 mg total) by mouth daily.  Marland Kitchen atorvastatin (LIPITOR) 20 MG tablet Take 1 tablet (20 mg total) by mouth daily.  . bisoprolol (ZEBETA) 5 MG tablet Take 1 tablet (5 mg total) by mouth daily.  . budesonide-formoterol (  SYMBICORT) 160-4.5 MCG/ACT inhaler INHALE 2 PUFFS BY MOUTH EVERY 12 HOURS (FIRST THING IN THE MORNING THEN 12 HOURS LATER)  . cloNIDine (CATAPRES) 0.1 MG tablet Take 1 tablet (0.1 mg total) by mouth 2 (two) times daily.  . colchicine 0.6 MG tablet   . furosemide (LASIX) 20 MG tablet Take 1 tablet (20 mg total) by mouth daily.  Marland Kitchen spironolactone (ALDACTONE) 25 MG tablet TAKE 1/2 TABLET BY MOUTH EVERY DAY                              Objective:   Physical Exam  03/19/2013  Wt 208 vs 06/17/2013 209  09/15/2013  206 > 212 01/10/2014 > 07/11/2014  210  > 10/10/2014 209 >210 01/11/2015 > 03/13/2015 212 > 04/20/2015  213>219 06/14/2015 > 12/01/2015   220 > 03/15/2016   220 > /10/03/2016  211 > 03/24/2017   212 >  08/25/2017  208 > 10/06/2017   207     amb bm nad/ vital signs reviewed/ sats 98% on RA on arrival - note pulse 49 reportedly on catapres and zebta     HEENT: nl  turbinates bilaterally, and oropharynx. Nl external ear canals without cough reflex - edentulous    NECK :  without JVD/Nodes/TM/ nl carotid upstrokes bilaterally   LUNGS: no acc muscle use, moderately barrel chested / distant bs with min wheeze     CV:  RRR  no s3 or murmur or increase in P2, and no edema   ABD:  Very obese soft and nontender with nl inspiratory excursion in the supine position. No bruits or organomegaly appreciated, bowel sounds nl  MS:  Nl gait/ ext warm s calf tenderness, cyanosis or clubbing    SKIN: warm and dry without lesions    NEURO:  alert, approp, nl sensorium with  no motor or cerebellar deficits apparent.          CXR PA and Lateral:   10/06/2017 :    I personally reviewed images and agree with radiology impression as follows:    Stable opacities at the right lung base corresponding to the nodules noted on the CT from 2017.                    Assessment & Plan:

## 2017-10-06 NOTE — Patient Instructions (Addendum)
Please remember to go to the  x-ray department downstairs in the basement  for your tests - we will call you with the results when they are available.      See Tammy NP in 4  weeks with all your medications, even over the counter meds, separated in two separate bags, the ones you take no matter what vs the ones you stop once you feel better and take only as needed when you feel you need them.   Tammy  will generate for you a new user friendly medication calendar that will put [place] all on the same page re: your medication use.     Without this process, it simply isn't possible to assure that we are providing  your outpatient care  with  the attention to detail we feel you deserve.   If we cannot assure that you're getting that kind of care,  then we cannot manage your problem effectively from this clinic.  Once you have seen Tammy and we are sure that we're all on the same page with your medication use she will arrange follow up with me.  - please have him put the names of all the docs he sees on back of med calendar so we can review who is  Doing what part of his care at each ov  eg who is following lung ca?

## 2017-10-07 NOTE — Assessment & Plan Note (Signed)
Lung nodule RLL >CT guided bx >Squamous cell carcinoma 06/07/15 >refer [facility]/Radiation Oncology /Oncology  Need to sort out who is following this problem long term / no change on plain cxr today

## 2017-10-07 NOTE — Assessment & Plan Note (Signed)
-   12/18/2012  Walked RA  2 laps @ 185 ft each stopped due to  desat 87% - 03/19/2013  Walked 2lpm x 3 laps @ 185 ft each stopped due to  End of study, sat 91% - 06/17/2013   Walked RA x one lap @ 185 stopped due to desat to 87 when started second lap -09/15/2013   Walked RA x one lap @ 185 stopped due to sob, no desat  - 04/20/2015  Walked RA x 3 laps @ 185 ft each stopped due to  End of study, no pace, no desat   - 10/06/2017  Walked RA x 3 laps @ 185 ft each stopped due to  End of study, fast pace, no sob or desat     Rec:  As of 10/06/2017  02 2lpm sleeping  Only

## 2017-10-07 NOTE — Assessment & Plan Note (Signed)
-   PFT's 02/05/13 FEV1  1.46  (50%) ratio 55 p 22% better p B2,  DLCO  61 corrects to 81% -  04/20/2015  Walked RA x 3 laps @ 185 ft each stopped due to  End study, no sob, nl pace  - Spirometry 04/20/2015 >  FEV1  1.21 (48%) ratio 58 before am rx     10/06/2017  After extensive coaching HFA effectiveness =    90%   He is relatively well compensated from  A pulmonary perspective but continue to worry about his insight/ adherence to general medical care noting he does not know who his doctors are or names of any meds   To keep things simple, I have asked the patient to first separate medicines that are perceived as maintenance, that is to be taken daily "no matter what", from those medicines that are taken on only on an as-needed basis and I have given the patient examples of both, and then return to see our NP to generate a  detailed  medication calendar which should be followed until the next physician sees the patient and updates it.     I had an extended discussion with the patient reviewing all relevant studies completed to date and  lasting 15 to 20 minutes of a 25 minute visit    Each maintenance medication was reviewed in detail including most importantly the difference between maintenance and prns and under what circumstances the prns are to be triggered using an action plan format that is not reflected in the computer generated alphabetically organized AVS.    Please see AVS for specific instructions unique to this visit that I personally wrote and verbalized to the the pt in detail and then reviewed with pt  by my nurse highlighting any  changes in therapy recommended at today's visit to their plan of care.

## 2017-10-27 ENCOUNTER — Other Ambulatory Visit: Payer: Self-pay | Admitting: Internal Medicine

## 2017-11-04 ENCOUNTER — Encounter: Payer: Medicare Other | Admitting: Adult Health

## 2017-12-24 ENCOUNTER — Other Ambulatory Visit: Payer: Self-pay | Admitting: Internal Medicine

## 2017-12-25 NOTE — Telephone Encounter (Signed)
Meds have been deferred to patient's PCP multiple times via phone notes

## 2018-01-01 ENCOUNTER — Ambulatory Visit: Payer: Medicare Other | Admitting: Internal Medicine

## 2019-04-08 ENCOUNTER — Emergency Department (HOSPITAL_COMMUNITY): Payer: Medicare Other

## 2019-04-08 ENCOUNTER — Inpatient Hospital Stay (HOSPITAL_COMMUNITY)
Admission: EM | Admit: 2019-04-08 | Discharge: 2019-04-09 | DRG: 641 | Disposition: A | Discharge status: Home / Self Care | Payer: Medicare Other | Attending: Internal Medicine | Admitting: Internal Medicine

## 2019-04-08 ENCOUNTER — Inpatient Hospital Stay (HOSPITAL_COMMUNITY): Payer: Medicare Other

## 2019-04-08 ENCOUNTER — Other Ambulatory Visit: Payer: Self-pay

## 2019-04-08 ENCOUNTER — Encounter (HOSPITAL_COMMUNITY): Payer: Self-pay | Admitting: Family Medicine

## 2019-04-08 DIAGNOSIS — R195 Other fecal abnormalities: Secondary | ICD-10-CM | POA: Diagnosis present

## 2019-04-08 DIAGNOSIS — Z85118 Personal history of other malignant neoplasm of bronchus and lung: Secondary | ICD-10-CM

## 2019-04-08 DIAGNOSIS — D649 Anemia, unspecified: Secondary | ICD-10-CM | POA: Diagnosis not present

## 2019-04-08 DIAGNOSIS — R55 Syncope and collapse: Secondary | ICD-10-CM | POA: Diagnosis present

## 2019-04-08 DIAGNOSIS — I110 Hypertensive heart disease with heart failure: Secondary | ICD-10-CM | POA: Diagnosis present

## 2019-04-08 DIAGNOSIS — E861 Hypovolemia: Secondary | ICD-10-CM | POA: Diagnosis present

## 2019-04-08 DIAGNOSIS — N183 Chronic kidney disease, stage 3 unspecified: Secondary | ICD-10-CM

## 2019-04-08 DIAGNOSIS — Z87891 Personal history of nicotine dependence: Secondary | ICD-10-CM

## 2019-04-08 DIAGNOSIS — I272 Pulmonary hypertension, unspecified: Secondary | ICD-10-CM | POA: Diagnosis present

## 2019-04-08 DIAGNOSIS — Z888 Allergy status to other drugs, medicaments and biological substances status: Secondary | ICD-10-CM

## 2019-04-08 DIAGNOSIS — T783XXA Angioneurotic edema, initial encounter: Secondary | ICD-10-CM | POA: Diagnosis present

## 2019-04-08 DIAGNOSIS — Z923 Personal history of irradiation: Secondary | ICD-10-CM

## 2019-04-08 DIAGNOSIS — E538 Deficiency of other specified B group vitamins: Principal | ICD-10-CM | POA: Diagnosis present

## 2019-04-08 DIAGNOSIS — Z79899 Other long term (current) drug therapy: Secondary | ICD-10-CM

## 2019-04-08 DIAGNOSIS — Z8249 Family history of ischemic heart disease and other diseases of the circulatory system: Secondary | ICD-10-CM

## 2019-04-08 DIAGNOSIS — I5032 Chronic diastolic (congestive) heart failure: Secondary | ICD-10-CM | POA: Diagnosis present

## 2019-04-08 DIAGNOSIS — N289 Disorder of kidney and ureter, unspecified: Secondary | ICD-10-CM | POA: Diagnosis present

## 2019-04-08 DIAGNOSIS — E785 Hyperlipidemia, unspecified: Secondary | ICD-10-CM | POA: Diagnosis present

## 2019-04-08 DIAGNOSIS — M47812 Spondylosis without myelopathy or radiculopathy, cervical region: Secondary | ICD-10-CM | POA: Diagnosis present

## 2019-04-08 DIAGNOSIS — Z881 Allergy status to other antibiotic agents status: Secondary | ICD-10-CM

## 2019-04-08 DIAGNOSIS — J9611 Chronic respiratory failure with hypoxia: Secondary | ICD-10-CM | POA: Diagnosis present

## 2019-04-08 DIAGNOSIS — Z7951 Long term (current) use of inhaled steroids: Secondary | ICD-10-CM

## 2019-04-08 DIAGNOSIS — I10 Essential (primary) hypertension: Secondary | ICD-10-CM | POA: Diagnosis present

## 2019-04-08 DIAGNOSIS — I2510 Atherosclerotic heart disease of native coronary artery without angina pectoris: Secondary | ICD-10-CM | POA: Diagnosis present

## 2019-04-08 DIAGNOSIS — I252 Old myocardial infarction: Secondary | ICD-10-CM | POA: Diagnosis not present

## 2019-04-08 DIAGNOSIS — E1165 Type 2 diabetes mellitus with hyperglycemia: Secondary | ICD-10-CM | POA: Diagnosis present

## 2019-04-08 DIAGNOSIS — D72829 Elevated white blood cell count, unspecified: Secondary | ICD-10-CM | POA: Diagnosis present

## 2019-04-08 DIAGNOSIS — I7389 Other specified peripheral vascular diseases: Secondary | ICD-10-CM | POA: Diagnosis present

## 2019-04-08 DIAGNOSIS — D539 Nutritional anemia, unspecified: Secondary | ICD-10-CM | POA: Diagnosis present

## 2019-04-08 DIAGNOSIS — R918 Other nonspecific abnormal finding of lung field: Secondary | ICD-10-CM | POA: Diagnosis present

## 2019-04-08 DIAGNOSIS — D638 Anemia in other chronic diseases classified elsewhere: Secondary | ICD-10-CM | POA: Diagnosis present

## 2019-04-08 DIAGNOSIS — R739 Hyperglycemia, unspecified: Secondary | ICD-10-CM | POA: Diagnosis not present

## 2019-04-08 DIAGNOSIS — K8020 Calculus of gallbladder without cholecystitis without obstruction: Secondary | ICD-10-CM | POA: Diagnosis present

## 2019-04-08 DIAGNOSIS — S0101XA Laceration without foreign body of scalp, initial encounter: Secondary | ICD-10-CM | POA: Diagnosis present

## 2019-04-08 DIAGNOSIS — K760 Fatty (change of) liver, not elsewhere classified: Secondary | ICD-10-CM | POA: Diagnosis present

## 2019-04-08 DIAGNOSIS — J449 Chronic obstructive pulmonary disease, unspecified: Secondary | ICD-10-CM | POA: Diagnosis present

## 2019-04-08 LAB — CBC WITH DIFFERENTIAL/PLATELET
Abs Immature Granulocytes: 1 10*3/uL — ABNORMAL HIGH (ref 0.00–0.07)
Basophils Absolute: 0 10*3/uL (ref 0.0–0.1)
Basophils Relative: 0 %
Eosinophils Absolute: 0.1 10*3/uL (ref 0.0–0.5)
Eosinophils Relative: 0 %
HCT: 20.3 % — ABNORMAL LOW (ref 39.0–52.0)
Hemoglobin: 5.9 g/dL — CL (ref 13.0–17.0)
Immature Granulocytes: 5 %
Lymphocytes Relative: 10 %
Lymphs Abs: 1.8 10*3/uL (ref 0.7–4.0)
MCH: 31.6 pg (ref 26.0–34.0)
MCHC: 29.1 g/dL — ABNORMAL LOW (ref 30.0–36.0)
MCV: 108.6 fL — ABNORMAL HIGH (ref 80.0–100.0)
Monocytes Absolute: 1.7 10*3/uL — ABNORMAL HIGH (ref 0.1–1.0)
Monocytes Relative: 9 %
Neutro Abs: 14.1 10*3/uL — ABNORMAL HIGH (ref 1.7–7.7)
Neutrophils Relative %: 76 %
Platelets: 312 10*3/uL (ref 150–400)
RBC: 1.87 MIL/uL — ABNORMAL LOW (ref 4.22–5.81)
RDW: 15.9 % — ABNORMAL HIGH (ref 11.5–15.5)
WBC: 18.7 10*3/uL — ABNORMAL HIGH (ref 4.0–10.5)
nRBC: 16.6 % — ABNORMAL HIGH (ref 0.0–0.2)

## 2019-04-08 LAB — HEMOGLOBIN A1C
Hgb A1c MFr Bld: 5.8 % — ABNORMAL HIGH (ref 4.8–5.6)
Mean Plasma Glucose: 119.76 mg/dL

## 2019-04-08 LAB — COMPREHENSIVE METABOLIC PANEL
ALT: 12 U/L (ref 0–44)
AST: 18 U/L (ref 15–41)
Albumin: 2.8 g/dL — ABNORMAL LOW (ref 3.5–5.0)
Alkaline Phosphatase: 71 U/L (ref 38–126)
Anion gap: 15 (ref 5–15)
BUN: 51 mg/dL — ABNORMAL HIGH (ref 8–23)
CO2: 17 mmol/L — ABNORMAL LOW (ref 22–32)
Calcium: 8.5 mg/dL — ABNORMAL LOW (ref 8.9–10.3)
Chloride: 105 mmol/L (ref 98–111)
Creatinine, Ser: 1.73 mg/dL — ABNORMAL HIGH (ref 0.61–1.24)
GFR calc Af Amer: 44 mL/min — ABNORMAL LOW (ref >60)
GFR calc non Af Amer: 38 mL/min — ABNORMAL LOW (ref >60)
Glucose, Bld: 232 mg/dL — ABNORMAL HIGH (ref 70–99)
Potassium: 4.5 mmol/L (ref 3.5–5.1)
Sodium: 137 mmol/L (ref 135–145)
Total Bilirubin: 0.6 mg/dL (ref 0.3–1.2)
Total Protein: 5.8 g/dL — ABNORMAL LOW (ref 6.5–8.1)

## 2019-04-08 LAB — GLUCOSE, CAPILLARY
Glucose-Capillary: 170 mg/dL — ABNORMAL HIGH (ref 70–99)
Glucose-Capillary: 145 mg/dL — ABNORMAL HIGH (ref 70–99)
Glucose-Capillary: 144 mg/dL — ABNORMAL HIGH (ref 70–99)
Glucose-Capillary: 138 mg/dL — ABNORMAL HIGH (ref 70–99)

## 2019-04-08 LAB — POC OCCULT BLOOD, ED: Fecal Occult Bld: POSITIVE — AB

## 2019-04-08 LAB — HEMOGLOBIN AND HEMATOCRIT, BLOOD
HCT: 25 % — ABNORMAL LOW (ref 39.0–52.0)
Hemoglobin: 7.8 g/dL — ABNORMAL LOW (ref 13.0–17.0)

## 2019-04-08 LAB — RETICULOCYTES
Immature Retic Fract: 43.7 % — ABNORMAL HIGH (ref 2.3–15.9)
RBC.: 1.77 MIL/uL — ABNORMAL LOW (ref 4.22–5.81)
Retic Count, Absolute: 162.5 10*3/uL (ref 19.0–186.0)
Retic Ct Pct: 9.2 % — ABNORMAL HIGH (ref 0.4–3.1)

## 2019-04-08 LAB — FOLATE: Folate: 4.8 ng/mL — ABNORMAL LOW (ref >5.9)

## 2019-04-08 LAB — BRAIN NATRIURETIC PEPTIDE: B Natriuretic Peptide: 61 pg/mL (ref 0.0–100.0)

## 2019-04-08 LAB — IRON AND TIBC
Iron: 82 ug/dL (ref 45–182)
Saturation Ratios: 26 % (ref 17.9–39.5)
TIBC: 315 ug/dL (ref 250–450)
UIBC: 233 ug/dL

## 2019-04-08 LAB — PROTIME-INR
INR: 1.3 — ABNORMAL HIGH (ref 0.8–1.2)
Prothrombin Time: 16.1 seconds — ABNORMAL HIGH (ref 11.4–15.2)

## 2019-04-08 LAB — VITAMIN B12: Vitamin B-12: 88 pg/mL — ABNORMAL LOW (ref 180–914)

## 2019-04-08 LAB — PREPARE RBC (CROSSMATCH)

## 2019-04-08 LAB — FERRITIN: Ferritin: 80 ng/mL (ref 24–336)

## 2019-04-08 LAB — TROPONIN I: Troponin I: <0.03 ng/mL (ref <0.03)

## 2019-04-08 LAB — APTT: aPTT: 25 seconds (ref 24–36)

## 2019-04-08 LAB — ETHANOL: Alcohol, Ethyl (B): <10 mg/dL (ref <10)

## 2019-04-08 LAB — ABO/RH: ABO/RH(D): O POS

## 2019-04-08 MED ORDER — ATORVASTATIN CALCIUM 10 MG PO TABS
20.0000 mg | ORAL_TABLET | Freq: Every day | ORAL | Status: DC
  Administered 2019-04-08 – 2019-04-09 (×2): 20 mg via ORAL
  Filled 2019-04-08 (×2): qty 2

## 2019-04-08 MED ORDER — TETANUS-DIPHTH-ACELL PERTUSSIS 5-2.5-18.5 LF-MCG/0.5 IM SUSP
0.5000 mL | Freq: Once | INTRAMUSCULAR | Status: DC
  Filled 2019-04-08: qty 0.5

## 2019-04-08 MED ORDER — MOMETASONE FURO-FORMOTEROL FUM 200-5 MCG/ACT IN AERO
2.0000 | INHALATION_SPRAY | Freq: Two times a day (BID) | RESPIRATORY_TRACT | Status: DC
  Administered 2019-04-08 – 2019-04-09 (×2): 2 via RESPIRATORY_TRACT
  Filled 2019-04-08 (×2): qty 8.8

## 2019-04-08 MED ORDER — ONDANSETRON HCL 4 MG/2ML IJ SOLN: 4.0000 mg | Freq: Four times a day (QID) | INTRAMUSCULAR | Status: DC | PRN

## 2019-04-08 MED ORDER — SODIUM CHLORIDE 0.9% FLUSH
3.0000 mL | Freq: Two times a day (BID) | INTRAVENOUS | Status: DC
  Administered 2019-04-08 – 2019-04-09 (×2): 3 mL via INTRAVENOUS

## 2019-04-08 MED ORDER — PANTOPRAZOLE SODIUM 40 MG IV SOLR
40.0000 mg | Freq: Two times a day (BID) | INTRAVENOUS | Status: DC
  Administered 2019-04-08: 09:00:00 40 mg via INTRAVENOUS
  Filled 2019-04-08: qty 40

## 2019-04-08 MED ORDER — ALBUTEROL SULFATE (2.5 MG/3ML) 0.083% IN NEBU: 2.5000 mg | INHALATION_SOLUTION | RESPIRATORY_TRACT | Status: DC | PRN

## 2019-04-08 MED ORDER — BISOPROLOL FUMARATE 5 MG PO TABS
5.0000 mg | ORAL_TABLET | Freq: Every day | ORAL | Status: DC
  Administered 2019-04-08 – 2019-04-09 (×2): 5 mg via ORAL
  Filled 2019-04-08 (×2): qty 1

## 2019-04-08 MED ORDER — INSULIN ASPART 100 UNIT/ML ~~LOC~~ SOLN
0.0000 [IU] | SUBCUTANEOUS | Status: DC
  Administered 2019-04-08 (×2): 1 [IU] via SUBCUTANEOUS
  Administered 2019-04-09: 2 [IU] via SUBCUTANEOUS
  Administered 2019-04-09 (×3): 1 [IU] via SUBCUTANEOUS

## 2019-04-08 MED ORDER — ACETAMINOPHEN 325 MG PO TABS: 650.0000 mg | ORAL_TABLET | Freq: Four times a day (QID) | ORAL | Status: DC | PRN

## 2019-04-08 MED ORDER — CLONIDINE HCL 0.1 MG PO TABS
0.1000 mg | ORAL_TABLET | Freq: Two times a day (BID) | ORAL | Status: DC
  Administered 2019-04-08 – 2019-04-09 (×3): 0.1 mg via ORAL
  Filled 2019-04-08 (×3): qty 1

## 2019-04-08 MED ORDER — ACETAMINOPHEN 650 MG RE SUPP: 650.0000 mg | Freq: Four times a day (QID) | RECTAL | Status: DC | PRN

## 2019-04-08 MED ORDER — LIDOCAINE-EPINEPHRINE (PF) 2 %-1:200000 IJ SOLN
10.0000 mL | Freq: Once | INTRAMUSCULAR | Status: DC
  Filled 2019-04-08: qty 20

## 2019-04-08 MED ORDER — SODIUM CHLORIDE 0.9 % IV SOLN
INTRAVENOUS | Status: AC
  Administered 2019-04-08: 07:00:00 via INTRAVENOUS

## 2019-04-08 MED ORDER — SODIUM CHLORIDE 0.9% IV SOLUTION
Freq: Once | INTRAVENOUS | Status: AC
  Administered 2019-04-08: 05:00:00 via INTRAVENOUS

## 2019-04-08 MED ORDER — GADOBUTROL 1 MMOL/ML IV SOLN
8.0000 mL | Freq: Once | INTRAVENOUS | Status: AC | PRN
  Administered 2019-04-08: 8 mL via INTRAVENOUS

## 2019-04-08 MED ORDER — ONDANSETRON HCL 4 MG PO TABS: 4.0000 mg | ORAL_TABLET | Freq: Four times a day (QID) | ORAL | Status: DC | PRN

## 2019-04-08 MED ORDER — PANTOPRAZOLE SODIUM 40 MG PO TBEC
40.0000 mg | DELAYED_RELEASE_TABLET | Freq: Every day | ORAL | Status: DC
  Administered 2019-04-09: 40 mg via ORAL
  Filled 2019-04-08: qty 1

## 2019-04-08 NOTE — Plan of Care (Signed)
  Problem: Nutrition: Goal: Adequate nutrition will be maintained Outcome: Progressing Note:  Eating with  no distress

## 2019-04-08 NOTE — Progress Notes (Addendum)
[facility] Consult: 8:21 AM 04/08/2019  LOS: 0 days    Referring Provider: Dr Wyline Copas  Primary Care Physician: Doristine Church to a medical clinic on [address]. Primary Gastroenterologist:  none   Reason for Consultation:  Macrocytic anemia   HPI: John [name] is a 73 y.o. male.  PMH sq cell RLL lung CA 2016, treated with XRT.  Marland Kitchen Pulm htn.  COPD.   CHF EF 55 to 60% in 2015.  DM 2 on oral agents. Htn.  Cholelithiasis, possible fatty liver on [place] 03/2017.  ACE I angioedema.  Underwent colon cancer screening with stool DNA testing within the last couple of years.  Has never undergone colonoscopy or EGD per his recall.   Patient has felt more dyspnea on exertion in the last week or so.  However it is not so bad that he is not able to cut his neighbors large lawn with a riding lawnmower and weed whack with a hand-held weed [employer]are [employer].  He has 2-3 brown stools daily.  No weight fluctuation.  Appetite stable.  No nausea, dysphagia, heartburn.  Takes BC powders 1 daily for aches and pains.  Occasional SOB stable, not new.  On prn oxygen which he uses almost exclusively at night to help him prevent snoring and ensure better sleep. For a couple of days has felt dizzy with positional changes.  Yesterday afternoon he got up quickly to go to the commode and while lowering himself on the commode he had a syncopal spell.  Sustained lac to scalp.  2nd syncopal spell observed by EMS as he stood.  105/79 at ED arrival, HR 90s, once to 120s.  Hgb 5.9 (12.6 in 07/2017).  MCV 108.  FOBT + Not iron deficient.  Low folate, low B12. WBCs 18.7, no fever.   Normal coags.   + renal insufficiency, AKI vs CKD, BUN 51.   Troponins not elevated CT head with atrophy, small vessel ischemia.  C spine DJD.  No acute trauma.  CXR with new RLL mass.  CM.    S/p  1 U PRBC, no repeat Hgb yet.   RN this morning says he was observed to have what looked like absence seizure symptoms where he was just staring out and briefly unresponsive.  Family history negative for colon cancer, peptic ulcer disease. Quit alcohol many years ago.  Retired Horticulturist, commercial.  Lives alone, divorced.  Has children and grandchildren in the area.   Past Medical History:  Diagnosis Date  . CHF (congestive heart failure) ([place])   . COPD (chronic obstructive pulmonary disease) ([place])   . Gout   . Hyperlipemia   . Hypertension   . 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 MI (myocardial infarction) ([place])   . Pulmonary hypertension ([place])     Past Surgical History:  Procedure Laterality Date  . APPENDECTOMY    . LUNG BIOPSY      Prior to Admission medications   Medication Sig Start Date End Date Taking? Authorizing Provider  albuterol (PROAIR  HFA) 108 (90 BASE) MCG/ACT inhaler INHALE 2 PUFFS BY MOUTH EVERY 4 HOURS AS NEEDED FOR WHEEZING Patient taking differently: Inhale 2 puffs into the lungs every 4 (four) hours as needed for wheezing.  03/13/15  Yes Tanda Rockers, MD  allopurinol (ZYLOPRIM) 100 MG tablet Take 1 tablet (100 mg total) by mouth daily. 09/26/16  Yes Tanda Rockers, MD  amLODipine (NORVASC) 10 MG tablet Take 1 tablet (10 mg total) by mouth daily. 09/26/16  Yes Tanda Rockers, MD  atorvastatin (LIPITOR) 20 MG tablet Take 1 tablet (20 mg total) by mouth daily. 09/26/16  Yes Tanda Rockers, MD  bisoprolol (ZEBETA) 5 MG tablet Take 1 tablet (5 mg total) by mouth daily. 09/26/16  Yes Tanda Rockers, MD  budesonide-formoterol (SYMBICORT) 160-4.5 MCG/ACT inhaler INHALE 2 PUFFS BY MOUTH EVERY 12 HOURS (FIRST THING IN THE MORNING THEN 12 HOURS LATER) Patient taking differently: Inhale 2 puffs into the lungs 2 (two) times daily as needed (SOB).  03/24/17  Yes Tanda Rockers, MD  cloNIDine (CATAPRES) 0.1 MG tablet Take 1 tablet (0.1 mg total)  by mouth 2 (two) times daily. 12/17/16  Yes Tanda Rockers, MD  colchicine 0.6 MG tablet Take 0.6 mg by mouth daily.  08/01/17  Yes [provider]  furosemide (LASIX) 20 MG tablet Take 1 tablet (20 mg total) by mouth daily. 12/17/16  Yes Tanda Rockers, MD  metFORMIN (GLUCOPHAGE) 1000 MG tablet Take 1,000 mg by mouth 2 (two) times daily. 04/07/19  Yes [provider]  spironolactone (ALDACTONE) 25 MG tablet Take 12.5 mg by mouth daily. TAKE 1/2 TABLET BY MOUTH EVERY DAY 12/17/16  Yes Tanda Rockers, MD  Vitamin D, Ergocalciferol, (DRISDOL) 1.25 MG (50000 UT) CAPS capsule Take 1 capsule by mouth once a week. 03/10/19  Yes [provider]    Scheduled Meds: . insulin aspart  0-9 Units Subcutaneous Q4H  . lidocaine-EPINEPHrine  10 mL Infiltration Once  . mometasone-formoterol  2 puff Inhalation BID  . pantoprazole (PROTONIX) IV  40 mg Intravenous Q12H  . sodium chloride flush  3 mL Intravenous Q12H  . Tdap  0.5 mL Intramuscular Once   Infusions: . sodium chloride 100 mL/hr at 04/08/19 0643   PRN Meds: acetaminophen **OR** acetaminophen, albuterol, ondansetron **OR** ondansetron (ZOFRAN) IV   Allergies as of 04/08/2019 - Review Complete 04/08/2019  Allergen Reaction Noted  . Levaquin [levofloxacin in d5w]  12/06/2012  . Lisinopril Swelling 02/22/2012    Family History  Problem Relation Age of Onset  . Heart disease Mother   . Cancer Neg Hx     Social History   Socioeconomic History  . Marital status: Legally Separated    Spouse name: Not on file  . Number of children: 2  . Years of education: Not on file  . Highest education level: Not on file  Occupational History  . Not on file  Social Needs  . Financial resource strain: Not on file  . Food insecurity:    Worry: Not on file    Inability: Not on file  . Transportation needs:    Medical: Not on file    Non-medical: Not on file  Tobacco Use  . Smoking status: Former Smoker    Packs/day: 1.50     Years: 60.00    Pack years: 90.00    Types: Cigarettes    Last attempt to quit: 09/29/2012    Years since quitting: 6.5  . Smokeless tobacco: Never Used  Substance and Sexual Activity  . Alcohol use: No    Alcohol/week: 0.0 standard drinks  . Drug use: No  . Sexual activity: Yes  Lifestyle  . Physical activity:    Days per week: Not on file    Minutes per session: Not on file  . Stress: Not on file  Relationships  . Social connections:    Talks on phone: Not on file    Gets together: Not on file    Attends religious service: Not on file    Active member of club or organization: Not on file    Attends meetings of clubs or organizations: Not on file    Relationship status: Not on file  . Intimate partner violence:    Fear of current or ex partner: Not on file    Emotionally abused: Not on file    Physically abused: Not on file    Forced sexual activity: Not on file  Other Topics Concern  . Not on file  Social History Narrative  . Not on file    REVIEW OF SYSTEMS: Constitutional: Per HPI. ENT:  No nose bleeds Pulm: Per HPI.  No cough. CV:  No palpitations, no LE edema.  No chest pain GU:  No hematuria, no frequency GI: Per HPI. Heme: Denies unusual or excessive bleeding or bruising. Transfusions: No previous blood transfusions. Neuro:  No headaches, no peripheral tingling or numbness Derm:  No itching, no rash or sores.  Endocrine:  No sweats or chills.  No polyuria or dysuria Immunization: Reviewed. Travel:  None beyond local counties in last few months.    PHYSICAL EXAM: Vital signs in last 24 hours: Vitals:   04/08/19 0633 04/08/19 0737  BP: (!) 144/68 (!) 129/56  Pulse: (!) 120 84  Resp: 12 (!) 22  Temp: 98.6 F (37 C) 98.2 F (36.8 C)  SpO2: 100% 99%   Wt Readings from Last 3 Encounters:  04/08/19 89.1 kg  10/06/17 93.9 kg  08/25/17 94.6 kg    General: Pleasant, calm, alert, comfortable.  Very polite.  Appears his stated age.  Not acutely ill  appearing Head: No facial asymmetry or swelling.  No signs of head trauma. Eyes: No scleral icterus.  No conjunctival pallor. Ears: Not hard of hearing Nose: Congestion or discharge Mouth: Oropharynx moist, pink, clear.  Full set of dentures in place.  Tongue midline. Neck: JVD, no masses, no thyromegaly. Lungs: Diminished breath sounds diffusely.  No adventitious sounds.  No cough.  No dyspnea. Heart: RRR.  No MRG.  S1, S2 present. Abdomen: Soft.  Not tender or distended.  No HSM, masses, bruits, hernias.  Active bowel sounds..   Rectal: Deferred.  Stool submitted to the lab yesterday FOBT positive but not melenic or grossly bloody.. Musc/Skeltl: Arthritic changes in his hands/fingers and knees. Extremities: No CCE. Neurologic: Alert.  Oriented x3.  Good historian.  No tremor, no limb weakness.  No gross deficits. Skin: No rashes.  Sutured laceration behind his right ear without bleeding. Tattoos: None observed. Nodes: No cervical adenopathy. Psych: Calm, cooperative, pleasant.  Fluid speech.  Intake/Output from previous day: 04/08 0701 - 04/09 0700 In: 3  Out: -  Intake/Output this shift: No intake/output data recorded.  LAB RESULTS: Recent Labs    04/08/19 0310  WBC 18.7*  HGB 5.9*  HCT 20.3*  PLT 312   BMET Lab Results  Component Value Date   NA 137 04/08/2019   NA 140 08/25/2017   NA 136 04/03/2017  K 4.5 04/08/2019   K 3.8 08/25/2017   K 3.8 04/03/2017   CL 105 04/08/2019   CL 104 08/25/2017   CL 102 04/03/2017   CO2 17 (L) 04/08/2019   CO2 27 08/25/2017   CO2 26 04/03/2017   GLUCOSE 232 (H) 04/08/2019   GLUCOSE 109 (H) 08/25/2017   GLUCOSE 139 (H) 04/03/2017   BUN 51 (H) 04/08/2019   BUN 18 08/25/2017   BUN 19 04/03/2017   CREATININE 1.73 (H) 04/08/2019   CREATININE 1.19 08/25/2017   CREATININE 1.25 (H) 04/03/2017   CALCIUM 8.5 (L) 04/08/2019   CALCIUM 9.4 08/25/2017   CALCIUM 9.0 04/03/2017   LFT Recent Labs    04/08/19 0310  PROT 5.8*   ALBUMIN 2.8*  AST 18  ALT 12  ALKPHOS 71  BILITOT 0.6   PT/INR Lab Results  Component Value Date   INR 1.3 (H) 04/08/2019   INR 1.09 06/05/2015     RADIOLOGY STUDIES: Dg Chest 2 View  Result Date: 04/08/2019 CLINICAL DATA:  Initial evaluation for acute syncope. EXAM: CHEST - 2 VIEW COMPARISON:  Prior radiograph from 10/06/2017 FINDINGS: Mild cardiomegaly.  Mediastinal silhouette normal. Lungs normally inflated. There is a 5.4 cm ovoid masslike opacity at the right lower lobe, new from previous. Additional irregular densities within the mid and lower right lung otherwise grossly similar. Minimal linear atelectasis and/or scarring at the left lung base. No other consolidative opacity. No edema or effusion. No pneumothorax. No acute osseous finding. Multilevel degenerative spurring noted throughout the visualized spine. IMPRESSION: 1. New 5.4 cm right lower lobe mass, indeterminate. Further evaluation with dedicated cross-sectional imaging of the chest recommended. 2. Cardiomegaly. Electronically Signed   By: Jeannine Boga M.D.   On: 04/08/2019 03:36   Ct Head Wo Contrast  Result Date: 04/08/2019 CLINICAL DATA:  Head trauma, minor, GCS>=13, high clinical risk, initial exam. Syncopal episode at home. EXAM: CT HEAD WITHOUT CONTRAST CT CERVICAL SPINE WITHOUT CONTRAST TECHNIQUE: Multidetector CT imaging of the head and cervical spine was performed following the standard protocol without intravenous contrast. Multiplanar CT image reconstructions of the cervical spine were also generated. COMPARISON:  None. FINDINGS: CT HEAD FINDINGS Brain: No intracranial hemorrhage, mass effect, or midline shift. Age related atrophy. Moderate to advanced chronic small vessel ischemia. No hydrocephalus. The basilar cisterns are patent. No evidence of territorial infarct or acute ischemia. No extra-axial or intracranial fluid collection. Vascular: Atherosclerosis of skullbase vasculature without hyperdense vessel  or abnormal calcification. Skull: No fracture or focal lesion. Sinuses/Orbits: Mucosal thickening of the ethmoid air cells. Remote medial left orbital fracture. Frontal sinuses are hypo pneumatized. Mastoid air cells are clear. Other: None. CT CERVICAL SPINE FINDINGS Alignment: Slight straightening of normal lordosis. No traumatic subluxation. Skull base and vertebrae: No acute fracture. Vertebral body heights are maintained. The dens and skull base are intact. Soft tissues and spinal canal: No prevertebral fluid or swelling. No visible canal hematoma. Disc levels: Disc space narrowing and endplate spurring throughout, most prominent at C5-C6. Mild scattered facet arthropathy. Upper chest: No acute findings. Other: Dense carotid calcifications. IMPRESSION: 1. No acute intracranial abnormality. No skull fracture. 2. Age related atrophy. Moderate to advanced chronic small vessel ischemia. 3. Degenerative change in the cervical spine without acute fracture or subluxation. 4. Carotid and skullbase atherosclerosis. Electronically Signed   By: Keith Rake M.D.   On: 04/08/2019 03:32   Ct Cervical Spine Wo Contrast  Result Date: 04/08/2019 CLINICAL DATA:  Head trauma, minor, GCS>=13, high clinical risk,  initial exam. Syncopal episode at home. EXAM: CT HEAD WITHOUT CONTRAST CT CERVICAL SPINE WITHOUT CONTRAST TECHNIQUE: Multidetector CT imaging of the head and cervical spine was performed following the standard protocol without intravenous contrast. Multiplanar CT image reconstructions of the cervical spine were also generated. COMPARISON:  None. FINDINGS: CT HEAD FINDINGS Brain: No intracranial hemorrhage, mass effect, or midline shift. Age related atrophy. Moderate to advanced chronic small vessel ischemia. No hydrocephalus. The basilar cisterns are patent. No evidence of territorial infarct or acute ischemia. No extra-axial or intracranial fluid collection. Vascular: Atherosclerosis of skullbase vasculature  without hyperdense vessel or abnormal calcification. Skull: No fracture or focal lesion. Sinuses/Orbits: Mucosal thickening of the ethmoid air cells. Remote medial left orbital fracture. Frontal sinuses are hypo pneumatized. Mastoid air cells are clear. Other: None. CT CERVICAL SPINE FINDINGS Alignment: Slight straightening of normal lordosis. No traumatic subluxation. Skull base and vertebrae: No acute fracture. Vertebral body heights are maintained. The dens and skull base are intact. Soft tissues and spinal canal: No prevertebral fluid or swelling. No visible canal hematoma. Disc levels: Disc space narrowing and endplate spurring throughout, most prominent at C5-C6. Mild scattered facet arthropathy. Upper chest: No acute findings. Other: Dense carotid calcifications. IMPRESSION: 1. No acute intracranial abnormality. No skull fracture. 2. Age related atrophy. Moderate to advanced chronic small vessel ischemia. 3. Degenerative change in the cervical spine without acute fracture or subluxation. 4. Carotid and skullbase atherosclerosis. Electronically Signed   By: Keith Rake M.D.   On: 04/08/2019 03:32    IMPRESSION:   *   Macrocytic anemia, symptomatic with syncope.  B12 and folate deficient, not iron deficient but also FOBT +. Patient reports unremarkable/negative stool DNA testing for colon cancer within the last 1 to 2 years. GI ROS unremarkable.  ? Silent PUD/gastritis, ? AVMs, ? Neoplasia.    *  Sq cell lung CA treated with XRT 2016-2017.  Now with new RLL lung mass.    *   Leukocytosis.  No PNA.  Urine to be collected.    *   AKI vs CKD  51/1.7   PLAN:     *   EGD, colonoscopy?  *  Await post transfusion Hgb.    *  b12 and folate supplements.  Switch to oral PPI (empiric at this point)      Azucena Freed  04/08/2019, 8:21 AM Phone [phone]   Attending physician's note   I have taken a history, examined the patient and reviewed the chart. I agree with the Advanced  Practitioner's note, impression and recommendations.  73 year old male with right lower lung squamous cell carcinoma diagnosed in 2016 status post XRT admitted with severe symptomatic anemia. Elevated MCV 108, low folate 4.8 and B12 88, normal ferritin 80 and iron saturation ratio 26% No overt GI bleeding Heme positive on digital rectal exam  Etiology of severe anemia likely severe B12 and folate deficiency and also anemia of chronic disease (lung cancer)  Replete B12 and folate Transfuse to hemoglobin greater than 7  Given lack of evidence of overt GI bleeding, will hold off endoscopic evaluation for now due to COVID-19.  We will schedule follow-up office visit in 6 to 8 weeks and reassess if he will benefit from endoscopic evaluation (EGD and colonoscopy) at that point  Will need to reestablish with Dr. Julien Nordmann and Dr. Melvyn Novas for work-up of right lower lobe mass.  Has not seen them since 2018  We will sign off, available if have any questions or  concerns  Damaris Hippo , MD [phone]

## 2019-04-08 NOTE — ED Notes (Signed)
Critical lab: Hemoglobin 5.9  MD notified

## 2019-04-08 NOTE — ED Notes (Signed)
Patient transported to CT 

## 2019-04-08 NOTE — Progress Notes (Signed)
PROGRESS NOTE    John [name]  ZDG:[id number] DOB: 20-Feb-1946 DOA: 04/08/2019 PCP: Renato Shin, MD    Brief Narrative:  73 y.o. male with medical history significant for hypertension, coronary artery disease, chronic diastolic CHF, COPD, chronic hypoxic respiratory failure, and non-small cell lung cancer status post radiotherapy, now presenting to the emergency department for evaluation of syncope. Patient reports the insidious development of fatigue over the course of months, then developed dark stool about 3 days ago. He denies any significant abdominal pain, denies vomiting, reports occasional loose stools. Denies chest pain, palpitations, fevers, or chills. He has not seen his oncologist in a couple years. Reports chronic DOE that is unchanged. Reports history of leg swelling, but none recently.   ED Course: Upon arrival to the ED, patient is found to be afebrile, saturating low 90s on room air, and with vitals otherwise normal.  Chest x-ray features a new 5.4 cm right lower lobe mass.  Noncontrast head CT is negative for acute intracranial abnormality or skull fracture, and cervical spine CT is negative for acute fracture or subluxation.  Chemistry panel is notable for bicarbonate of 17, glucose 232, BUN 51, and creatinine 1.73, up from 1.19 in 2018.  CBC is notable for leukocytosis to 18,700 and a macrocytic anemia with hemoglobin 5.9, down from 12.6 in 2018.  Fecal occult blood testing is positive.  2 units of packed red blood cells were ordered for transfusion and gastroenterology was consulted by the ED physician, recommending a medical admission.  Assessment & Plan:   Principal Problem:   Syncope Active Problems:   Chronic diastolic CHF (congestive heart failure) (HCC)   HTN (hypertension)   CAD (coronary artery disease)   Chronic respiratory failure with hypoxia (HCC)   COPD II/III with reversibility    Renal insufficiency   Right lower lobe lung mass   Macrocytic anemia  Occult GI bleeding   Hyperglycemia   Leukocytosis  1. Syncope  - Presents after a syncopal episode that occurred while he was moving his bowels, and he had a second syncopal episode upon standing with EMS  - Pt had denied chest pains - Cont on hydration and given PRBC transfusion - Suspect syncopal episode as a result of profound anemia, per below  2. Macrocytic anemia; occult GI bleeding  - Hgb is 5.9 on admission, down from 12.6 in 2018; MCV is 108.6, normal in 2018  - He denies melena or hematochezia, and denies abdominal pain  - FOBT is positive in ED  - 2 units RBC ordered - GI is consulted, considering EGD vs colon - Follow up post-transfusion hgb  3. Renal insufficiency  - SCr is 1.73 on admission, up from 1.19 in 2018  - Chronicity unclear, will check urine chemistries, renally-dose medications, avoid nephrotoxins, and repeat chem panel in am    4. Lung mass  - CXR features a 5.4 cm RLL mass  - Patient has history of non small cell lung cancer s/p curative radiotherapy to RML nodule and hilar adenopathy, was following with oncology for surveillance with chest CT q51mos, but appears to have stopped following  - On minimal O2 support currently   5. COPD; chronic hypoxic respiratory failure  - Patient uses 2 Lpm supplemental O2 at baseline, reports his chronic dyspnea is stable  - Continue ICS/LABA, supplemental O2, and as-needed albuterol  - Stable at present   6. Leukocytosis  - WBC is 18,700 on admission without fever or infectious s/s  - Afebrile  7.  Hypertension  - BP presently stable and controlled -given stable bp, will hold norvasc, spironolactone, lasix -continue bisoprolol given CAD and cont clonidine to prevent rebound HTN  8. CAD  - No anginal complaints  - Pharmacy med-rec pending   9. Hyperglycemia  - Serum glucose is 232 on admission  - A1c was normal remotely  -cont ssi coverage as needed  10. Chronic diastolic CHF  - Appears hypovolemic  on admission  - Continue IVF hydration and transfusions   DVT prophylaxis: SCD's Code Status: Full Family Communication: Pt in room, updated patient's granddaughter over phone Disposition Plan: Uncertain at this time  Consultants:   GI  Procedures:     Antimicrobials: Anti-infectives (From admission, onward)   None       Subjective: Denies abd pain or sob  Objective: Vitals:   04/08/19 1003 04/08/19 1006 04/08/19 1017 04/08/19 1123  BP: 91/65 91/65 (!) 128/54 127/72  Pulse: 82 83 79 87  Resp:      Temp:    97.8 F (36.6 C)  TempSrc:      SpO2: 100% 100% 100% 100%  Weight:      Height:        Intake/Output Summary (Last 24 hours) at 04/08/2019 1135 Last data filed at 04/08/2019 1032 Gross per 24 hour  Intake 243 ml  Output --  Net 243 ml   Filed Weights   04/08/19 0737  Weight: 89.1 kg    Examination: General exam: Appears calm and comfortable  Respiratory system: Clear to auscultation. Respiratory effort normal. Cardiovascular system: S1 & S2 heard, RRR Gastrointestinal system: Abdomen is nondistended, soft and nontender. No organomegaly or masses felt. Normal bowel sounds heard. Central nervous system: Alert and oriented. No focal neurological deficits. Extremities: Symmetric 5 x 5 power. Skin: No rashes, lesions Psychiatry: appears somewhat confused. Mood & affect appropriate.   Data Reviewed: I have personally reviewed following labs and imaging studies  CBC: Recent Labs  Lab 04/08/19 0310  WBC 18.7*  NEUTROABS 14.1*  HGB 5.9*  HCT 20.3*  MCV 108.6*  PLT 220   Basic Metabolic Panel: Recent Labs  Lab 04/08/19 0310  NA 137  K 4.5  CL 105  CO2 17*  GLUCOSE 232*  BUN 51*  CREATININE 1.73*  CALCIUM 8.5*   GFR: Estimated Creatinine Clearance: 38.4 mL/min (A) (by C-G formula based on SCr of 1.73 mg/dL (H)). Liver Function Tests: Recent Labs  Lab 04/08/19 0310  AST 18  ALT 12  ALKPHOS 71  BILITOT 0.6  PROT 5.8*  ALBUMIN 2.8*    No results for input(s): LIPASE, AMYLASE in the last 168 hours. No results for input(s): AMMONIA in the last 168 hours. Coagulation Profile: Recent Labs  Lab 04/08/19 0410  INR 1.3*   Cardiac Enzymes: Recent Labs  Lab 04/08/19 0310  TROPONINI <0.03   BNP (last 3 results) No results for input(s): PROBNP in the last 8760 hours. HbA1C: Recent Labs    04/08/19 0518  HGBA1C 5.8*   CBG: Recent Labs  Lab 04/08/19 0630 04/08/19 1122  GLUCAP 170* 145*   Lipid Profile: No results for input(s): CHOL, HDL, LDLCALC, TRIG, CHOLHDL, LDLDIRECT in the last 72 hours. Thyroid Function Tests: No results for input(s): TSH, T4TOTAL, FREET4, T3FREE, THYROIDAB in the last 72 hours. Anemia Panel: Recent Labs    04/08/19 0518  VITAMINB12 88*  FOLATE 4.8*  FERRITIN 80  TIBC 315  IRON 82  RETICCTPCT 9.2*   Sepsis Labs: No results for input(s): PROCALCITON,  LATICACIDVEN in the last 168 hours.  No results found for this or any previous visit (from the past 240 hour(s)).   Radiology Studies: Dg Chest 2 View  Result Date: 04/08/2019 CLINICAL DATA:  Initial evaluation for acute syncope. EXAM: CHEST - 2 VIEW COMPARISON:  Prior radiograph from 10/06/2017 FINDINGS: Mild cardiomegaly.  Mediastinal silhouette normal. Lungs normally inflated. There is a 5.4 cm ovoid masslike opacity at the right lower lobe, new from previous. Additional irregular densities within the mid and lower right lung otherwise grossly similar. Minimal linear atelectasis and/or scarring at the left lung base. No other consolidative opacity. No edema or effusion. No pneumothorax. No acute osseous finding. Multilevel degenerative spurring noted throughout the visualized spine. IMPRESSION: 1. New 5.4 cm right lower lobe mass, indeterminate. Further evaluation with dedicated cross-sectional imaging of the chest recommended. 2. Cardiomegaly. Electronically Signed   By: Jeannine Boga M.D.   On: 04/08/2019 03:36   Ct Head Wo  Contrast  Result Date: 04/08/2019 CLINICAL DATA:  Head trauma, minor, GCS>=13, high clinical risk, initial exam. Syncopal episode at home. EXAM: CT HEAD WITHOUT CONTRAST CT CERVICAL SPINE WITHOUT CONTRAST TECHNIQUE: Multidetector CT imaging of the head and cervical spine was performed following the standard protocol without intravenous contrast. Multiplanar CT image reconstructions of the cervical spine were also generated. COMPARISON:  None. FINDINGS: CT HEAD FINDINGS Brain: No intracranial hemorrhage, mass effect, or midline shift. Age related atrophy. Moderate to advanced chronic small vessel ischemia. No hydrocephalus. The basilar cisterns are patent. No evidence of territorial infarct or acute ischemia. No extra-axial or intracranial fluid collection. Vascular: Atherosclerosis of skullbase vasculature without hyperdense vessel or abnormal calcification. Skull: No fracture or focal lesion. Sinuses/Orbits: Mucosal thickening of the ethmoid air cells. Remote medial left orbital fracture. Frontal sinuses are hypo pneumatized. Mastoid air cells are clear. Other: None. CT CERVICAL SPINE FINDINGS Alignment: Slight straightening of normal lordosis. No traumatic subluxation. Skull base and vertebrae: No acute fracture. Vertebral body heights are maintained. The dens and skull base are intact. Soft tissues and spinal canal: No prevertebral fluid or swelling. No visible canal hematoma. Disc levels: Disc space narrowing and endplate spurring throughout, most prominent at C5-C6. Mild scattered facet arthropathy. Upper chest: No acute findings. Other: Dense carotid calcifications. IMPRESSION: 1. No acute intracranial abnormality. No skull fracture. 2. Age related atrophy. Moderate to advanced chronic small vessel ischemia. 3. Degenerative change in the cervical spine without acute fracture or subluxation. 4. Carotid and skullbase atherosclerosis. Electronically Signed   By: Keith Rake M.D.   On: 04/08/2019 03:32    Ct Cervical Spine Wo Contrast  Result Date: 04/08/2019 CLINICAL DATA:  Head trauma, minor, GCS>=13, high clinical risk, initial exam. Syncopal episode at home. EXAM: CT HEAD WITHOUT CONTRAST CT CERVICAL SPINE WITHOUT CONTRAST TECHNIQUE: Multidetector CT imaging of the head and cervical spine was performed following the standard protocol without intravenous contrast. Multiplanar CT image reconstructions of the cervical spine were also generated. COMPARISON:  None. FINDINGS: CT HEAD FINDINGS Brain: No intracranial hemorrhage, mass effect, or midline shift. Age related atrophy. Moderate to advanced chronic small vessel ischemia. No hydrocephalus. The basilar cisterns are patent. No evidence of territorial infarct or acute ischemia. No extra-axial or intracranial fluid collection. Vascular: Atherosclerosis of skullbase vasculature without hyperdense vessel or abnormal calcification. Skull: No fracture or focal lesion. Sinuses/Orbits: Mucosal thickening of the ethmoid air cells. Remote medial left orbital fracture. Frontal sinuses are hypo pneumatized. Mastoid air cells are clear. Other: None. CT CERVICAL SPINE FINDINGS Alignment:  Slight straightening of normal lordosis. No traumatic subluxation. Skull base and vertebrae: No acute fracture. Vertebral body heights are maintained. The dens and skull base are intact. Soft tissues and spinal canal: No prevertebral fluid or swelling. No visible canal hematoma. Disc levels: Disc space narrowing and endplate spurring throughout, most prominent at C5-C6. Mild scattered facet arthropathy. Upper chest: No acute findings. Other: Dense carotid calcifications. IMPRESSION: 1. No acute intracranial abnormality. No skull fracture. 2. Age related atrophy. Moderate to advanced chronic small vessel ischemia. 3. Degenerative change in the cervical spine without acute fracture or subluxation. 4. Carotid and skullbase atherosclerosis. Electronically Signed   By: Keith Rake M.D.    On: 04/08/2019 03:32    Scheduled Meds:  insulin aspart  0-9 Units Subcutaneous Q4H   mometasone-formoterol  2 puff Inhalation BID   pantoprazole  40 mg Oral Q0600   sodium chloride flush  3 mL Intravenous Q12H   Tdap  0.5 mL Intramuscular Once   Continuous Infusions:  sodium chloride 100 mL/hr at 04/08/19 0643     LOS: 0 days   Marylu Lund, MD [facility] Pager On Amion  If 7PM-7AM, please contact night-coverage 04/08/2019, 11:35 AM

## 2019-04-08 NOTE — Progress Notes (Signed)
Pt arrived to unit at 0630. Pt started having seizure activity at [phone] while still on ED stretcher in [place] hallway . Rapid and MD paged. Staff was able to get pt in unit bed once seizure activity stopped. Oxygen applied, suction set up ready, vitals stable. Pt now alert and oriented x4. Rapid response nurse assessment completed. Pt in no acute distress after episode. Will continue to monitor pt.

## 2019-04-08 NOTE — TOC Initial Note (Signed)
Transition of Care [facility]) - Initial/Assessment Note    Patient Details  Name: John [name] MRN: [id number] Date of Birth: 1946/07/06  [facility] CM/SW Contact:    Sherrilyn Rist Phone Number: [phone] 04/08/2019, 1:37 PM  Clinical Narrative:                 Patient lives at home with spouse and granddaughter Janett Billow; PCP: Renato Shin, MD; has private insurance with Medicare; pharmacy of choice is [facility]; no problem getting medication; DME - walker, cane and home oxygen ( through [facility]); DCP - return home when medically stable; Janett Billow and spouse do not want any [place] services for the patient at this time. CM will continue to follow for progression of care.  Expected Discharge Plan: Home/Self Care Barriers to Discharge: No Barriers Identified   Patient Goals and CMS Choice Patient states their goals for this hospitalization and ongoing recovery are:: to go home and stay there CMS Medicare.gov Compare Post Acute Care list provided to:: Patient Choice offered to / list presented to : Adult Children  Expected Discharge Plan and Services Expected Discharge Plan: Home/Self Care In-house Referral: NA Discharge Planning Services: CM Consult Post Acute Care Choice: NA Living arrangements for the past 2 months: Single Family Home                 DME Arranged: N/A DME Agency: NA HH Arranged: Refused [place] Agency: NA  Prior Living Arrangements/Services Living arrangements for the past 2 months: [place] Lives with:: Spouse, Adult Children Patient language and need for interpreter reviewed:: No Do you feel safe going back to the place where you live?: Yes      Need for Family Participation in Patient Care: No (Comment) Care giver support system in place?: Yes (comment)   Criminal Activity/Legal Involvement Pertinent to Current Situation/Hospitalization: No - Comment as needed  Activities of Daily Living Home Assistive  Devices/Equipment: Walker (specify type) ADL Screening (condition at time of admission) Patient's cognitive ability adequate to safely complete daily activities?: Yes Is the patient deaf or have difficulty hearing?: Yes Does the patient have difficulty seeing, even when wearing glasses/contacts?: Yes Does the patient have difficulty concentrating, remembering, or making decisions?: No Patient able to express need for assistance with ADLs?: No Does the patient have difficulty dressing or bathing?: No Independently performs ADLs?: No Communication: Independent Dressing (OT): Independent Grooming: Independent Feeding: Independent Does the patient have difficulty walking or climbing stairs?: No Weakness of Legs: Both Weakness of Arms/Hands: None  Permission Sought/Granted Permission sought to share information with : Case Manager Permission granted to share information with : Yes, Verbal Permission Granted  Share Information with NAME: spouse and granddaughter Janett Billow           Emotional Assessment Appearance:: Developmentally appropriate Attitude/Demeanor/Rapport: Gracious Affect (typically observed): Accepting Orientation: : Fluctuating Orientation (Suspected and/or reported Sundowners) Alcohol / Substance Use: Not Applicable Psych Involvement: No (comment)  Admission diagnosis:  Syncope and collapse [R55] Low hemoglobin [D64.9] Patient Active Problem List   Diagnosis Date Noted  . Renal insufficiency 04/08/2019  . Right lower lobe lung mass 04/08/2019  . Syncope and collapse 04/08/2019  . Symptomatic anemia 04/08/2019  . Occult GI bleeding 04/08/2019  . Hyperglycemia 04/08/2019  . Leukocytosis 04/08/2019  . Syncope 04/08/2019  . Low hemoglobin   . Dyspnea on exertion 08/25/2017  . Bursitis of elbow 03/15/2016  . Obesity (BMI 30-39.9) 12/03/2015  . Primary cancer of right lower  lobe of lung ([place]) 06/14/2015  . Solitary pulmonary nodule 03/13/2015  . RVF (right  ventricular failure) ([place]) 11/04/2014  . COPD II/III with reversibility  12/19/2012  . Chronic respiratory failure with hypoxia ([place]) 12/18/2012  . Drug-induced hyperglycemia 12/08/2012  . Allergic drug rash 12/06/2012  . Pulmonary HTN ([place]) 03/18/2012  . Abnormal echocardiogram 03/18/2012  . LV dysfunction 03/18/2012  . COPD exacerbation ([place]) 02/22/2012  . Chronic diastolic CHF (congestive heart failure) ([place]) 02/22/2012  . HTN (hypertension) 02/22/2012  . CAD (coronary artery disease) 02/22/2012  . Acute on chronic systolic heart failure ([place]) 02/22/2012   PCP:  Renato Shin, MD Pharmacy:   [place], [place] - [address] [place] [address] Phone: [phone] Fax: [phone]     Social Determinants of Health (SDOH) Interventions    Readmission Risk Interventions No flowsheet data found.

## 2019-04-08 NOTE — ED Provider Notes (Signed)
[place] EMERGENCY DEPARTMENT Provider Note   CSN: [id number] Arrival date & time: 04/08/19  0246    History   Chief Complaint Chief Complaint  Patient presents with  . Loss of Consciousness    HPI John [name] is a 73 y.o. male.     HPI   John [name] is a 73 y.o. male, with a history of CHF, COPD, hyperlipidemia, HTN, MI, lung cancer, and pulmonary hypertension, presenting to the ED with syncopal episode that occurred this evening. States he was sitting on the toilet trying to have a bowel movement when he lost consciousness.  He does not know for how long.   Upon EMS arrival, they had the patient stand and he had another syncopal episode.  Patient notes lightheadedness with position changes.  Patient complains of a head wound to the right rear of his head.  Occasional shortness of breath, but states this is not unusual for him.  He uses 2 L supplemental O2 PRN. Denies anticoagulation. Denies fever/chills, cough, numbness, weakness, vision changes, chest pain, lower extremity edema/pain, abdominal pain, diaphoresis, urinary complaints, N/V/D, neck/back pain, hematochezia/melena, or any other complaints.     Past Medical History:  Diagnosis Date  . CHF (congestive heart failure) ([place])   . COPD (chronic obstructive pulmonary disease) ([place])   . Gout   . Hyperlipemia   . Hypertension   . 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 MI (myocardial infarction) ([place])   . Pulmonary hypertension [facility])     Patient Active Problem List   Diagnosis Date Noted  . Renal insufficiency 04/08/2019  . Right lower lobe lung mass 04/08/2019  . Syncope 04/08/2019  . Macrocytic anemia 04/08/2019  . Occult GI bleeding 04/08/2019  . Hyperglycemia 04/08/2019  . Leukocytosis 04/08/2019  . Dyspnea on exertion 08/25/2017  . Bursitis of elbow 03/15/2016  . Obesity (BMI 30-39.9) 12/03/2015  . Primary cancer of right lower lobe of lung  ([place]) 06/14/2015  . Solitary pulmonary nodule 03/13/2015  . RVF (right ventricular failure) ([place]) 11/04/2014  . COPD II/III with reversibility  12/19/2012  . Chronic respiratory failure with hypoxia ([place]) 12/18/2012  . Drug-induced hyperglycemia 12/08/2012  . Allergic drug rash 12/06/2012  . Pulmonary HTN ([place]) 03/18/2012  . Abnormal echocardiogram 03/18/2012  . LV dysfunction 03/18/2012  . COPD exacerbation ([place]) 02/22/2012  . Chronic diastolic CHF (congestive heart failure) ([place]) 02/22/2012  . HTN (hypertension) 02/22/2012  . CAD (coronary artery disease) 02/22/2012  . Acute on chronic systolic heart failure ([place]) 02/22/2012    Past Surgical History:  Procedure Laterality Date  . APPENDECTOMY    . LUNG BIOPSY          Home Medications    Prior to Admission medications   Medication Sig Start Date End Date Taking? Authorizing Provider  albuterol (PROAIR HFA) 108 (90 BASE) MCG/ACT inhaler INHALE 2 PUFFS BY MOUTH EVERY 4 HOURS AS NEEDED FOR WHEEZING Patient taking differently: Inhale 2 puffs into the lungs every 4 (four) hours as needed for wheezing.  03/13/15  Yes Tanda Rockers, MD  allopurinol (ZYLOPRIM) 100 MG tablet Take 1 tablet (100 mg total) by mouth daily. 09/26/16  Yes Tanda Rockers, MD  amLODipine (NORVASC) 10 MG tablet Take 1 tablet (10 mg total) by mouth daily. 09/26/16  Yes Tanda Rockers, MD  atorvastatin (LIPITOR) 20 MG tablet Take 1 tablet (20 mg total) by mouth daily. 09/26/16  Yes Wert,  Christena Deem, MD  bisoprolol (ZEBETA) 5 MG tablet Take 1 tablet (5 mg total) by mouth daily. 09/26/16  Yes Tanda Rockers, MD  budesonide-formoterol (SYMBICORT) 160-4.5 MCG/ACT inhaler INHALE 2 PUFFS BY MOUTH EVERY 12 HOURS (FIRST THING IN THE MORNING THEN 12 HOURS LATER) Patient taking differently: Inhale 2 puffs into the lungs 2 (two) times daily as needed (SOB).  03/24/17  Yes Tanda Rockers, MD  cloNIDine (CATAPRES) 0.1 MG tablet Take 1 tablet (0.1 mg total) by mouth 2 (two)  times daily. 12/17/16  Yes Tanda Rockers, MD  colchicine 0.6 MG tablet Take 0.6 mg by mouth daily.  08/01/17  Yes [provider]  furosemide (LASIX) 20 MG tablet Take 1 tablet (20 mg total) by mouth daily. 12/17/16  Yes Tanda Rockers, MD  metFORMIN (GLUCOPHAGE) 1000 MG tablet Take 1,000 mg by mouth 2 (two) times daily. 04/07/19  Yes [provider]  spironolactone (ALDACTONE) 25 MG tablet Take 12.5 mg by mouth daily. TAKE 1/2 TABLET BY MOUTH EVERY DAY 12/17/16  Yes Tanda Rockers, MD  Vitamin D, Ergocalciferol, (DRISDOL) 1.25 MG (50000 UT) CAPS capsule Take 1 capsule by mouth once a week. 03/10/19  Yes [provider]    Family History Family History  Problem Relation Age of Onset  . Heart disease Mother   . Cancer Neg Hx     Social History Social History   Tobacco Use  . Smoking status: Former Smoker    Packs/day: 1.50    Years: 60.00    Pack years: 90.00    Types: Cigarettes    Last attempt to quit: 09/29/2012    Years since quitting: 6.5  . Smokeless tobacco: Never Used  Substance Use Topics  . Alcohol use: No    Alcohol/week: 0.0 standard drinks  . Drug use: No     Allergies   Levaquin [levofloxacin in d5w] and Lisinopril   Review of Systems Review of Systems  Constitutional: Negative for chills, diaphoresis and fever.  Eyes: Negative for visual disturbance.  Respiratory: Negative for cough.   Cardiovascular: Negative for chest pain and leg swelling.  Gastrointestinal: Negative for abdominal pain, blood in stool, diarrhea, nausea and vomiting.  Genitourinary: Negative for dysuria, frequency and hematuria.  Musculoskeletal: Negative for back pain and neck pain.  Skin: Positive for wound.  Neurological: Positive for syncope and light-headedness. Negative for weakness, numbness and headaches.  All other systems reviewed and are negative.    Physical Exam Updated Vital Signs BP 105/79 (BP Location: Left Arm)   Pulse 93   Temp 98.1 F  (36.7 C) (Oral)   Resp (!) 22   SpO2 90%   Physical Exam Vitals signs and nursing note reviewed.  Constitutional:      General: He is not in acute distress.    Appearance: He is well-developed. He is not diaphoretic.  HENT:     Head: Normocephalic.     Comments: Approximately 2.5 cm wound with 1 cm off branch to the right occipital region.  No noted instability or deformity.    Nose: Nose normal.     Mouth/Throat:     Mouth: Mucous membranes are moist.     Pharynx: Oropharynx is clear.  Eyes:     Extraocular Movements: Extraocular movements intact.     Conjunctiva/sclera: Conjunctivae normal.     Pupils: Pupils are equal, round, and reactive to light.  Neck:     Musculoskeletal: Neck supple.  Cardiovascular:     Rate  and Rhythm: Normal rate and regular rhythm.     Pulses: Normal pulses.          Radial pulses are 2+ on the right side and 2+ on the left side.       Posterior tibial pulses are 2+ on the right side and 2+ on the left side.     Heart sounds: Normal heart sounds.     Comments: Tactile temperature in the extremities appropriate and equal bilaterally. Pulmonary:     Effort: Tachypnea (mild) present. No accessory muscle usage or retractions.     Breath sounds: Examination of the right-lower field reveals rhonchi. Rhonchi present.     Comments: Some increased work of breathing at rest. Abdominal:     Palpations: Abdomen is soft.     Tenderness: There is no abdominal tenderness. There is no guarding.  Genitourinary:    Rectum: Guaiac result positive.     Comments: No external hemorrhoids, fissures, or lesions noted. No frank blood or melena. No stool burden. No rectal tenderness. No foreign bodies noted.  No noted prostatic enlargement, bogginess, or tenderness. Dr. Leonette Monarch served as chaperone during the rectal exam. Musculoskeletal:     Right lower leg: No edema.     Left lower leg: No edema.     Comments: Normal motor function intact in all extremities. No midline  spinal tenderness.   Lymphadenopathy:     Cervical: No cervical adenopathy.  Skin:    General: Skin is warm and dry.  Neurological:     Mental Status: He is alert and oriented to person, place, and time.     Comments: Sensation grossly intact to light touch in the extremities.  Grip strengths equal bilaterally.  Strength 5/5 in all extremities. Coordination intact. Cranial nerves III-XII grossly intact. No facial droop.   Psychiatric:        Mood and Affect: Mood and affect normal.        Speech: Speech normal.        Behavior: Behavior normal.      ED Treatments / Results  Labs (all labs ordered are listed, but only abnormal results are displayed) Labs Reviewed  COMPREHENSIVE METABOLIC PANEL - Abnormal; Notable for the following components:      Result Value   CO2 17 (*)    Glucose, Bld 232 (*)    BUN 51 (*)    Creatinine, Ser 1.73 (*)    Calcium 8.5 (*)    Total Protein 5.8 (*)    Albumin 2.8 (*)    GFR calc non Af Amer 38 (*)    GFR calc Af Amer 44 (*)    All other components within normal limits  CBC WITH DIFFERENTIAL/PLATELET - Abnormal; Notable for the following components:   WBC 18.7 (*)    RBC 1.87 (*)    Hemoglobin 5.9 (*)    HCT 20.3 (*)    MCV 108.6 (*)    MCHC 29.1 (*)    RDW 15.9 (*)    nRBC 16.6 (*)    Neutro Abs 14.1 (*)    Monocytes Absolute 1.7 (*)    Abs Immature Granulocytes 1.00 (*)    All other components within normal limits  PROTIME-INR - Abnormal; Notable for the following components:   Prothrombin Time 16.1 (*)    INR 1.3 (*)    All other components within normal limits  RETICULOCYTES - Abnormal; Notable for the following components:   Retic Ct Pct 9.2 (*)    RBC.  1.77 (*)    Immature Retic Fract 43.7 (*)    All other components within normal limits  POC OCCULT BLOOD, ED - Abnormal; Notable for the following components:   Fecal Occult Bld POSITIVE (*)    All other components within normal limits  ETHANOL  TROPONIN I  BRAIN  NATRIURETIC PEPTIDE  APTT  URINALYSIS, ROUTINE W REFLEX MICROSCOPIC  RAPID URINE DRUG SCREEN, HOSP PERFORMED  HEMOGLOBIN A1C  SODIUM, URINE, RANDOM  CREATININE, URINE, RANDOM  UREA NITROGEN, URINE  VITAMIN B12  FOLATE  IRON AND TIBC  FERRITIN  POC OCCULT BLOOD, ED  TYPE AND SCREEN  ABO/RH  PREPARE RBC (CROSSMATCH)   Hemoglobin  Date Value Ref Range Status  04/08/2019 5.9 (LL) 13.0 - 17.0 g/dL Final    Comment:    REPEATED TO VERIFY THIS CRITICAL RESULT HAS VERIFIED AND BEEN CALLED TO M.BROOKS,RN BY MELISSA BROGDON ON 04 09 2020 AT 0340, AND HAS BEEN READ BACK.    08/25/2017 12.6 (L) 13.0 - 17.0 g/dL Final  04/03/2017 11.9 (L) 13.0 - 17.0 g/dL Final   HGB  Date Value Ref Range Status  10/04/2016 13.2 13.0 - 17.1 g/dL Final  03/25/2016 12.7 (L) 13.0 - 17.1 g/dL Final  12/26/2015 13.4 13.0 - 17.1 g/dL Final  07/18/2015 13.6 13.0 - 17.1 g/dL Final   BUN  Date Value Ref Range Status  04/08/2019 51 (H) 8 - 23 mg/dL Final  08/25/2017 18 6 - 23 mg/dL Final  04/03/2017 19 6 - 20 mg/dL Final  10/04/2016 11.6 7.0 - 26.0 mg/dL Final  03/25/2016 16.5 7.0 - 26.0 mg/dL Final  12/26/2015 17.6 7.0 - 26.0 mg/dL Final  11/15/2015 11.5 7.0 - 26.0 mg/dL Final   Creatinine  Date Value Ref Range Status  10/04/2016 1.3 0.7 - 1.3 mg/dL Final  03/25/2016 1.4 (H) 0.7 - 1.3 mg/dL Final  12/26/2015 1.5 (H) 0.7 - 1.3 mg/dL Final  11/15/2015 1.2 0.7 - 1.3 mg/dL Final   Creatinine, Ser  Date Value Ref Range Status  04/08/2019 1.73 (H) 0.61 - 1.24 mg/dL Final  08/25/2017 1.19 0.40 - 1.50 mg/dL Final  04/03/2017 1.25 (H) 0.61 - 1.24 mg/dL Final      EKG None An EKG was performed, but unfortunately was not brought directly to 1 of the providers and was not transmitted into the system.  Radiology Dg Chest 2 View  Result Date: 04/08/2019 CLINICAL DATA:  Initial evaluation for acute syncope. EXAM: CHEST - 2 VIEW COMPARISON:  Prior radiograph from 10/06/2017 FINDINGS: Mild cardiomegaly.   Mediastinal silhouette normal. Lungs normally inflated. There is a 5.4 cm ovoid masslike opacity at the right lower lobe, new from previous. Additional irregular densities within the mid and lower right lung otherwise grossly similar. Minimal linear atelectasis and/or scarring at the left lung base. No other consolidative opacity. No edema or effusion. No pneumothorax. No acute osseous finding. Multilevel degenerative spurring noted throughout the visualized spine. IMPRESSION: 1. New 5.4 cm right lower lobe mass, indeterminate. Further evaluation with dedicated cross-sectional imaging of the chest recommended. 2. Cardiomegaly. Electronically Signed   By: Jeannine Boga M.D.   On: 04/08/2019 03:36   Ct Head Wo Contrast  Result Date: 04/08/2019 CLINICAL DATA:  Head trauma, minor, GCS>=13, high clinical risk, initial exam. Syncopal episode at home. EXAM: CT HEAD WITHOUT CONTRAST CT CERVICAL SPINE WITHOUT CONTRAST TECHNIQUE: Multidetector CT imaging of the head and cervical spine was performed following the standard protocol without intravenous contrast. Multiplanar CT image reconstructions of the cervical  spine were also generated. COMPARISON:  None. FINDINGS: CT HEAD FINDINGS Brain: No intracranial hemorrhage, mass effect, or midline shift. Age related atrophy. Moderate to advanced chronic small vessel ischemia. No hydrocephalus. The basilar cisterns are patent. No evidence of territorial infarct or acute ischemia. No extra-axial or intracranial fluid collection. Vascular: Atherosclerosis of skullbase vasculature without hyperdense vessel or abnormal calcification. Skull: No fracture or focal lesion. Sinuses/Orbits: Mucosal thickening of the ethmoid air cells. Remote medial left orbital fracture. Frontal sinuses are hypo pneumatized. Mastoid air cells are clear. Other: None. CT CERVICAL SPINE FINDINGS Alignment: Slight straightening of normal lordosis. No traumatic subluxation. Skull base and vertebrae: No  acute fracture. Vertebral body heights are maintained. The dens and skull base are intact. Soft tissues and spinal canal: No prevertebral fluid or swelling. No visible canal hematoma. Disc levels: Disc space narrowing and endplate spurring throughout, most prominent at C5-C6. Mild scattered facet arthropathy. Upper chest: No acute findings. Other: Dense carotid calcifications. IMPRESSION: 1. No acute intracranial abnormality. No skull fracture. 2. Age related atrophy. Moderate to advanced chronic small vessel ischemia. 3. Degenerative change in the cervical spine without acute fracture or subluxation. 4. Carotid and skullbase atherosclerosis. Electronically Signed   By: Keith Rake M.D.   On: 04/08/2019 03:32   Ct Cervical Spine Wo Contrast  Result Date: 04/08/2019 CLINICAL DATA:  Head trauma, minor, GCS>=13, high clinical risk, initial exam. Syncopal episode at home. EXAM: CT HEAD WITHOUT CONTRAST CT CERVICAL SPINE WITHOUT CONTRAST TECHNIQUE: Multidetector CT imaging of the head and cervical spine was performed following the standard protocol without intravenous contrast. Multiplanar CT image reconstructions of the cervical spine were also generated. COMPARISON:  None. FINDINGS: CT HEAD FINDINGS Brain: No intracranial hemorrhage, mass effect, or midline shift. Age related atrophy. Moderate to advanced chronic small vessel ischemia. No hydrocephalus. The basilar cisterns are patent. No evidence of territorial infarct or acute ischemia. No extra-axial or intracranial fluid collection. Vascular: Atherosclerosis of skullbase vasculature without hyperdense vessel or abnormal calcification. Skull: No fracture or focal lesion. Sinuses/Orbits: Mucosal thickening of the ethmoid air cells. Remote medial left orbital fracture. Frontal sinuses are hypo pneumatized. Mastoid air cells are clear. Other: None. CT CERVICAL SPINE FINDINGS Alignment: Slight straightening of normal lordosis. No traumatic subluxation. Skull  base and vertebrae: No acute fracture. Vertebral body heights are maintained. The dens and skull base are intact. Soft tissues and spinal canal: No prevertebral fluid or swelling. No visible canal hematoma. Disc levels: Disc space narrowing and endplate spurring throughout, most prominent at C5-C6. Mild scattered facet arthropathy. Upper chest: No acute findings. Other: Dense carotid calcifications. IMPRESSION: 1. No acute intracranial abnormality. No skull fracture. 2. Age related atrophy. Moderate to advanced chronic small vessel ischemia. 3. Degenerative change in the cervical spine without acute fracture or subluxation. 4. Carotid and skullbase atherosclerosis. Electronically Signed   By: Keith Rake M.D.   On: 04/08/2019 03:32    Procedures .Critical Care Performed by: Lorayne Bender, PA-C Authorized by: Lorayne Bender, PA-C   Critical care provider statement:    Critical care time (minutes):  35   Critical care time was exclusive of:  Separately billable procedures and treating other patients   Critical care was necessary to treat or prevent imminent or life-threatening deterioration of the following conditions: Acute anemia with syncope.   Critical care was time spent personally by me on the following activities:  Discussions with consultants, development of treatment plan with patient or surrogate, examination of patient, obtaining history from  patient or surrogate, ordering and performing treatments and interventions, ordering and review of laboratory studies, ordering and review of radiographic studies, pulse oximetry, re-evaluation of patient's condition and review of old charts   I assumed direction of critical care for this patient from another provider in my specialty: no    .Marland KitchenLaceration Repair Date/Time: 04/08/2019 6:10 AM Performed by: Lorayne Bender, PA-C Authorized by: Lorayne Bender, PA-C   Consent:    Consent obtained:  Verbal   Consent given by:  Patient   Risks discussed:   Infection, need for additional repair, pain, poor cosmetic result and poor wound healing Anesthesia (see MAR for exact dosages):    Anesthesia method:  Local infiltration   Local anesthetic:  Lidocaine 2% WITH epi Laceration details:    Location:  Scalp   Scalp location:  Occipital   Length (cm):  3.5 Repair type:    Repair type:  Simple Pre-procedure details:    Preparation:  Patient was prepped and draped in usual sterile fashion Exploration:    Wound exploration: wound explored through full range of motion and entire depth of wound probed and visualized   Treatment:    Area cleansed with:  Betadine and saline   Amount of cleaning:  Standard   Irrigation solution:  Sterile saline   Irrigation method:  Syringe Skin repair:    Repair method:  Staples   Number of staples:  5 Approximation:    Approximation:  Close Post-procedure details:    Dressing:  Antibiotic ointment   Patient tolerance of procedure:  Tolerated well, no immediate complications   (including critical care time)  Medications Ordered in ED Medications  mometasone-formoterol (DULERA) 200-5 MCG/ACT inhaler 2 puff (has no administration in time range)  albuterol (PROVENTIL HFA;VENTOLIN HFA) 108 (90 Base) MCG/ACT inhaler 2 puff (has no administration in time range)  sodium chloride flush (NS) 0.9 % injection 3 mL (has no administration in time range)  insulin aspart (novoLOG) injection 0-9 Units (has no administration in time range)  acetaminophen (TYLENOL) tablet 650 mg (has no administration in time range)    Or  acetaminophen (TYLENOL) suppository 650 mg (has no administration in time range)  0.9 %  sodium chloride infusion (has no administration in time range)  ondansetron (ZOFRAN) tablet 4 mg (has no administration in time range)    Or  ondansetron (ZOFRAN) injection 4 mg (has no administration in time range)  pantoprazole (PROTONIX) injection 40 mg (has no administration in time range)  Tdap (BOOSTRIX)  injection 0.5 mL (0.5 mLs Intramuscular Not Given 04/08/19 0603)  lidocaine-EPINEPHrine (XYLOCAINE W/EPI) 2 %-1:200000 (PF) injection 10 mL (has no administration in time range)  0.9 %  sodium chloride infusion (Manually program via Guardrails IV Fluids) ( Intravenous New Bag/Given 04/08/19 0431)     Initial Impression / Assessment and Plan / ED Course  I have reviewed the triage vital signs and the nursing notes.  Pertinent labs & imaging results that were available during my care of the patient were reviewed by me and considered in my medical decision making (see chart for details).  Clinical Course as of Apr 07 617  Thu Apr 08, 2019  0357 The last chest imaging study available from 2018 states the right lower lobe lung nodule was stable at around 1.4 cm.  DG Chest 2 View [[facility]]  [phone] Spoke with Dr. Silverio Decamp, [facility]. Suspects anemia of chronic disease rather than acute GI hemorrhage.  They will see the patient later today.   [  [facility]]  [phone] Spoke with Dr. Myna Hidalgo, hospitalist. Agrees to admit the patient.   [[facility]]    Clinical Course User Index [[facility]] Jilberto Vanderwall C, PA-C       Patient presents following a syncopal episode.  Found to be anemic at 5.9.  Positive Hemoccult.  Patient also noted to have a diagnosis of lung cancer.  He states he is not currently under treatment.  The lung mass in the right lower lobe seems to have grown since last x-ray in our system 2 years ago. Suspect patient's anemia may be anemia of chronic disease.  No acute abnormalities on head or cervical spine CT. Patient admitted for further management of his anemia.  Findings and plan of care discussed with Addison Lank, MD. Dr. Leonette Monarch personally evaluated and examined this patient.  Vitals:   04/08/19 0251 04/08/19 0256 04/08/19 0300 04/08/19 0430  BP: 105/79  122/66 111/65  Pulse: 93  87   Resp: (!) 22  (!) 22 (!) 21  Temp: 98.1 F (36.7 C)     TempSrc: Oral     SpO2:  90% 98%      Final Clinical  Impressions(s) / ED Diagnoses   Final diagnoses:  Syncope and collapse  Low hemoglobin    ED Discharge Orders    None       Layla Maw 04/08/19 [place], [place], MD 04/10/19 [phone]

## 2019-04-08 NOTE — ED Notes (Addendum)
ED TO INPATIENT HANDOFF REPORT  ED Nurse Name and Phone #:  Shawntel Farnworth [phone]  S Name/Age/Gender John [name] 73 y.o. male Room/Bed: 016C/016C  Code Status   Code Status: Full Code  Home/SNF/Other Home Patient oriented to: self, place, time and situation Is this baseline? Yes   Triage Complete: Triage complete  Chief Complaint syncope  Triage Note EMS called after pt had episode of syncope at home. Sustained a laceration to back. When EMS arrived he had another synope episode where EMS witnessed (- for seizure activity). Family members state he hasnt been taking his cardio meds.   Allergies Allergies  Allergen Reactions  . Levaquin [Levofloxacin In D5w]     Pt states he is not allergic to medication, last took it about 8 years ago  . Lisinopril Swelling    Level of Care/Admitting Diagnosis ED Disposition    ED Disposition Condition [facility]: [place] [[id number]]  Level of Care: Telemetry Medical [104]  I expect the patient will be discharged within 24 hours: Yes  LOW acuity---Tx typically complete <24 hrs---ACUTE conditions typically can be evaluated <24 hours---LABS likely to return to acceptable levels <24 hours---IS near functional baseline---EXPECTED to return to current living arrangement---NOT newly hypoxic: Does not meet criteria for 5C-Observation unit  Diagnosis: Syncope [809983]  Admitting Physician: Vianne Bulls [[id number]]  Attending Physician: Vianne Bulls [[id number]]  PT Class (Do Not Modify): Observation [104]  PT Acc Code (Do Not Modify): Observation [10022]       B Medical/Surgery History Past Medical History:  Diagnosis Date  . CHF (congestive heart failure) ([place])   . COPD (chronic obstructive pulmonary disease) ([place])   . Gout   . Hyperlipemia   . Hypertension   . 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 MI (myocardial infarction) ([place])   . Pulmonary  hypertension ([place])    Past Surgical History:  Procedure Laterality Date  . APPENDECTOMY    . LUNG BIOPSY       A IV Location/Drains/Wounds Patient Lines/Drains/Airways Status   Active Line/Drains/Airways    Name:   Placement date:   Placement time:   Site:   Days:   Peripheral IV 04/08/19 Right Antecubital   04/08/19    0251    Antecubital   less than 1   Peripheral IV 04/08/19 Hand   04/08/19    0315    Hand   less than 1   External Urinary Catheter   04/08/19    0313    -   less than 1          Intake/Output Last 24 hours No intake or output data in the 24 hours ending 04/08/19 0516  Labs/Imaging Results for orders placed or performed during the hospital encounter of 04/08/19 (from the past 48 hour(s))  Comprehensive metabolic panel     Status: Abnormal   Collection Time: 04/08/19  3:10 AM  Result Value Ref Range   Sodium 137 135 - 145 mmol/L   Potassium 4.5 3.5 - 5.1 mmol/L   Chloride 105 98 - 111 mmol/L   CO2 17 (L) 22 - 32 mmol/L   Glucose, Bld 232 (H) 70 - 99 mg/dL   BUN 51 (H) 8 - 23 mg/dL   Creatinine, Ser 1.73 (H) 0.61 - 1.24 mg/dL   Calcium 8.5 (L) 8.9 - 10.3 mg/dL   Total Protein 5.8 (L) 6.5 - 8.1 g/dL  Albumin 2.8 (L) 3.5 - 5.0 g/dL   AST 18 15 - 41 U/L   ALT 12 0 - 44 U/L   Alkaline Phosphatase 71 38 - 126 U/L   Total Bilirubin 0.6 0.3 - 1.2 mg/dL   GFR calc non Af Amer 38 (L) >60 mL/min   GFR calc Af Amer 44 (L) >60 mL/min   Anion gap 15 5 - 15    Comment: Performed at [facility] [address], [place], [place] [address]  CBC with Differential     Status: Abnormal   Collection Time: 04/08/19  3:10 AM  Result Value Ref Range   WBC 18.7 (H) 4.0 - 10.5 K/uL   RBC 1.87 (L) 4.22 - 5.81 MIL/uL   Hemoglobin 5.9 (LL) 13.0 - 17.0 g/dL    Comment: REPEATED TO VERIFY THIS CRITICAL RESULT HAS VERIFIED AND BEEN CALLED TO M.Horatio Bertz,RN BY MELISSA BROGDON ON 04 09 2020 AT 0340, AND HAS BEEN READ BACK.     HCT 20.3 (L) 39.0 - 52.0 %   MCV 108.6 (H) 80.0  - 100.0 fL   MCH 31.6 26.0 - 34.0 pg   MCHC 29.1 (L) 30.0 - 36.0 g/dL   RDW 15.9 (H) 11.5 - 15.5 %   Platelets 312 150 - 400 K/uL   nRBC 16.6 (H) 0.0 - 0.2 %   Neutrophils Relative % 76 %   Neutro Abs 14.1 (H) 1.7 - 7.7 K/uL   Lymphocytes Relative 10 %   Lymphs Abs 1.8 0.7 - 4.0 K/uL   Monocytes Relative 9 %   Monocytes Absolute 1.7 (H) 0.1 - 1.0 K/uL   Eosinophils Relative 0 %   Eosinophils Absolute 0.1 0.0 - 0.5 K/uL   Basophils Relative 0 %   Basophils Absolute 0.0 0.0 - 0.1 K/uL   WBC Morphology MILD LEFT SHIFT (1-5% METAS, OCC MYELO, OCC BANDS)    RBC Morphology RARE NUCLEATED RED CELLS    Immature Granulocytes 5 %   Abs Immature Granulocytes 1.00 (H) 0.00 - 0.07 K/uL   Polychromasia PRESENT     Comment: Performed at [facility], [place] [address] [place], [place] [address]  Troponin I - Once     Status: None   Collection Time: 04/08/19  3:10 AM  Result Value Ref Range   Troponin I <0.03 <0.03 ng/mL    Comment: Performed at [facility] [address], [place], [place] [address]  Brain natriuretic peptide     Status: None   Collection Time: 04/08/19  3:10 AM  Result Value Ref Range   B Natriuretic Peptide 61.0 0.0 - 100.0 pg/mL    Comment: Performed at [facility] [address], [place], [place] [address]  POC occult blood, ED Provider will collect     Status: Abnormal   Collection Time: 04/08/19  3:57 AM  Result Value Ref Range   Fecal Occult Bld POSITIVE (A) NEGATIVE  Protime-INR     Status: Abnormal   Collection Time: 04/08/19  4:10 AM  Result Value Ref Range   Prothrombin Time 16.1 (H) 11.4 - 15.2 seconds   INR 1.3 (H) 0.8 - 1.2    Comment: (NOTE) INR goal varies based on device and disease states. Performed at [facility], [place] [address], [place],  [address]   APTT     Status: None   Collection Time: 04/08/19  4:10 AM  Result Value Ref Range   aPTT 25 24 - 36  seconds    Comment: Performed at [facility], [place] [address], [place], [place] [address]  Type and screen [name]     Status: None (Preliminary result)   Collection Time: 04/08/19  4:20 AM  Result Value Ref Range   ABO/RH(D) PENDING    Antibody Screen PENDING    Sample Expiration      04/11/2019 Performed at [facility], [place] [address] [place], [place] [address]    Dg Chest 2 View  Result Date: 04/08/2019 CLINICAL DATA:  Initial evaluation for acute syncope. EXAM: CHEST - 2 VIEW COMPARISON:  Prior radiograph from 10/06/2017 FINDINGS: Mild cardiomegaly.  Mediastinal silhouette normal. Lungs normally inflated. There is a 5.4 cm ovoid masslike opacity at the right lower lobe, new from previous. Additional irregular densities within the mid and lower right lung otherwise grossly similar. Minimal linear atelectasis and/or scarring at the left lung base. No other consolidative opacity. No edema or effusion. No pneumothorax. No acute osseous finding. Multilevel degenerative spurring noted throughout the visualized spine. IMPRESSION: 1. New 5.4 cm right lower lobe mass, indeterminate. Further evaluation with dedicated cross-sectional imaging of the chest recommended. 2. Cardiomegaly. Electronically Signed   By: Jeannine Boga M.D.   On: 04/08/2019 03:36   Ct Head Wo Contrast  Result Date: 04/08/2019 CLINICAL DATA:  Head trauma, minor, GCS>=13, high clinical risk, initial exam. Syncopal episode at home. EXAM: CT HEAD WITHOUT CONTRAST CT CERVICAL SPINE WITHOUT CONTRAST TECHNIQUE: Multidetector CT imaging of the head and cervical spine was performed following the standard protocol without intravenous contrast. Multiplanar CT image reconstructions of the cervical spine were also generated. COMPARISON:  None. FINDINGS: CT HEAD FINDINGS Brain: No intracranial hemorrhage, mass effect, or midline shift. Age related atrophy. Moderate to advanced chronic small vessel ischemia. No hydrocephalus. The basilar cisterns are patent. No evidence  of territorial infarct or acute ischemia. No extra-axial or intracranial fluid collection. Vascular: Atherosclerosis of skullbase vasculature without hyperdense vessel or abnormal calcification. Skull: No fracture or focal lesion. Sinuses/Orbits: Mucosal thickening of the ethmoid air cells. Remote medial left orbital fracture. Frontal sinuses are hypo pneumatized. Mastoid air cells are clear. Other: None. CT CERVICAL SPINE FINDINGS Alignment: Slight straightening of normal lordosis. No traumatic subluxation. Skull base and vertebrae: No acute fracture. Vertebral body heights are maintained. The dens and skull base are intact. Soft tissues and spinal canal: No prevertebral fluid or swelling. No visible canal hematoma. Disc levels: Disc space narrowing and endplate spurring throughout, most prominent at C5-C6. Mild scattered facet arthropathy. Upper chest: No acute findings. Other: Dense carotid calcifications. IMPRESSION: 1. No acute intracranial abnormality. No skull fracture. 2. Age related atrophy. Moderate to advanced chronic small vessel ischemia. 3. Degenerative change in the cervical spine without acute fracture or subluxation. 4. Carotid and skullbase atherosclerosis. Electronically Signed   By: Keith Rake M.D.   On: 04/08/2019 03:32   Ct Cervical Spine Wo Contrast  Result Date: 04/08/2019 CLINICAL DATA:  Head trauma, minor, GCS>=13, high clinical risk, initial exam. Syncopal episode at home. EXAM: CT HEAD WITHOUT CONTRAST CT CERVICAL SPINE WITHOUT CONTRAST TECHNIQUE: Multidetector CT imaging of the head and cervical spine was performed following the standard protocol without intravenous contrast. Multiplanar CT image reconstructions of the cervical spine were also generated. COMPARISON:  None. FINDINGS: CT HEAD FINDINGS Brain: No intracranial hemorrhage, mass effect, or midline shift. Age related atrophy. Moderate to advanced chronic small vessel ischemia. No hydrocephalus. The basilar cisterns are  patent. No  evidence of territorial infarct or acute ischemia. No extra-axial or intracranial fluid collection. Vascular: Atherosclerosis of skullbase vasculature without hyperdense vessel or abnormal calcification. Skull: No fracture or focal lesion. Sinuses/Orbits: Mucosal thickening of the ethmoid air cells. Remote medial left orbital fracture. Frontal sinuses are hypo pneumatized. Mastoid air cells are clear. Other: None. CT CERVICAL SPINE FINDINGS Alignment: Slight straightening of normal lordosis. No traumatic subluxation. Skull base and vertebrae: No acute fracture. Vertebral body heights are maintained. The dens and skull base are intact. Soft tissues and spinal canal: No prevertebral fluid or swelling. No visible canal hematoma. Disc levels: Disc space narrowing and endplate spurring throughout, most prominent at C5-C6. Mild scattered facet arthropathy. Upper chest: No acute findings. Other: Dense carotid calcifications. IMPRESSION: 1. No acute intracranial abnormality. No skull fracture. 2. Age related atrophy. Moderate to advanced chronic small vessel ischemia. 3. Degenerative change in the cervical spine without acute fracture or subluxation. 4. Carotid and skullbase atherosclerosis. Electronically Signed   By: Keith Rake M.D.   On: 04/08/2019 03:32    Pending Labs Unresulted Labs (From admission, onward)    Start     Ordered   04/09/19 9892  Basic metabolic panel  Tomorrow morning,   R     04/08/19 0505   04/08/19 0505  Sodium, urine, random  Add-on,   R     04/08/19 0505   04/08/19 0505  Creatinine, urine, random  Add-on,   R     04/08/19 0505   04/08/19 0505  Urea nitrogen, urine  Add-on,   R     04/08/19 0505   04/08/19 0505  Vitamin B12  (Anemia Panel (PNL))  Once,   R     04/08/19 0505   04/08/19 0505  Folate  (Anemia Panel (PNL))  Once,   R     04/08/19 0505   04/08/19 0505  Iron and TIBC  (Anemia Panel (PNL))  Once,   R     04/08/19 0505   04/08/19 0505  Ferritin  (Anemia  Panel (PNL))  Once,   R     04/08/19 0505   04/08/19 0505  Reticulocytes  (Anemia Panel (PNL))  Once,   R     04/08/19 0505   04/08/19 0503  Hemoglobin A1c  Once,   R    Comments:  To assess prior glycemic control    04/08/19 0505   04/08/19 0402  Prepare RBC  (Adult Blood Administration - Red Blood Cells)  Once,   R    Question Answer Comment  # of Units 2 units   Transfusion Indications Actively Bleeding / GI Bleed   Transfusion Indications Symptomatic Anemia   If emergent release call blood bank [facility] [phone]   Instructions: Transfuse      04/08/19 0401   04/08/19 0308  Ethanol  Once,   STAT     04/08/19 0308   04/08/19 0308  Urinalysis, Routine w reflex microscopic  ONCE - STAT,   STAT     04/08/19 0308   04/08/19 0308  Urine rapid drug screen (hosp performed)  ONCE - STAT,   STAT     04/08/19 0308          Vitals/Pain Today's Vitals   04/08/19 0300 04/08/19 0414 04/08/19 0415 04/08/19 0430  BP: 122/66   111/65  Pulse: 87     Resp: (!) 22   (!) 21  Temp:      TempSrc:      SpO2: 98%  PainSc:  0-No pain 0-No pain     Isolation Precautions No active isolations  Medications Medications  mometasone-formoterol (DULERA) 200-5 MCG/ACT inhaler 2 puff (has no administration in time range)  albuterol (PROVENTIL HFA;VENTOLIN HFA) 108 (90 Base) MCG/ACT inhaler 2 puff (has no administration in time range)  sodium chloride flush (NS) 0.9 % injection 3 mL (has no administration in time range)  insulin aspart (novoLOG) injection 0-9 Units (has no administration in time range)  acetaminophen (TYLENOL) tablet 650 mg (has no administration in time range)    Or  acetaminophen (TYLENOL) suppository 650 mg (has no administration in time range)  0.9 %  sodium chloride infusion (has no administration in time range)  ondansetron (ZOFRAN) tablet 4 mg (has no administration in time range)    Or  ondansetron (ZOFRAN) injection 4 mg (has no administration in time range)   pantoprazole (PROTONIX) injection 40 mg (has no administration in time range)  0.9 %  sodium chloride infusion (Manually program via Guardrails IV Fluids) ( Intravenous New Bag/Given 04/08/19 0431)    Mobility walks High fall risk   Focused Assessments Cardiac Assessment Handoff:  Cardiac Rhythm: Normal sinus rhythm Lab Results  Component Value Date   CKTOTAL 69 02/23/2012   CKMB 2.9 02/23/2012   TROPONINI <0.03 04/08/2019   Lab Results  Component Value Date   DDIMER 0.44 11/28/2012   Does the Patient currently have chest pain? No     R Recommendations: See Admitting Provider Note  Report given to:   Additional Notes:     Wound to posterior head, from fall.

## 2019-04-08 NOTE — ED Notes (Signed)
Patient Granddaughter calling asking for a call back needing an update Sewell Pitner [phone]

## 2019-04-08 NOTE — ED Triage Notes (Signed)
EMS called after pt had episode of syncope at home. Sustained a laceration to back. When EMS arrived he had another synope episode where EMS witnessed (- for seizure activity). Family members state he hasnt been taking his cardio meds.

## 2019-04-08 NOTE — ED Notes (Signed)
Called to give report, receiving RN yet to review sbar report.

## 2019-04-08 NOTE — Progress Notes (Signed)
Patient is is drowsy oriented vitals sign stable receiving blood transfusion started at 0800

## 2019-04-08 NOTE — H&P (Signed)
History and Physical    John [name] DXI:[id number] DOB: 22-Aug-1946 DOA: 04/08/2019  PCP: Renato Shin, MD   Patient coming from: Home   Chief Complaint: Syncope   HPI: John [name] is a 73 y.o. male with medical history significant for hypertension, coronary artery disease, chronic diastolic CHF, COPD, chronic hypoxic respiratory failure, and non-small cell lung cancer status post radiotherapy, now presenting to the emergency department for evaluation of syncope. Patient reports the insidious development of fatigue over the course of months, then developed dark stool about 3 days ago. He denies any significant abdominal pain, denies vomiting, reports occasional loose stools. Denies chest pain, palpitations, fevers, or chills. He has not seen his oncologist in a couple years. Reports chronic DOE that is unchanged. Reports history of leg swelling, but none recently.   ED Course: Upon arrival to the ED, patient is found to be afebrile, saturating low 90s on room air, and with vitals otherwise normal.  Chest x-ray features a new 5.4 cm right lower lobe mass.  Noncontrast head CT is negative for acute intracranial abnormality or skull fracture, and cervical spine CT is negative for acute fracture or subluxation.  Chemistry panel is notable for bicarbonate of 17, glucose 232, BUN 51, and creatinine 1.73, up from 1.19 in 2018.  CBC is notable for leukocytosis to 18,700 and a macrocytic anemia with hemoglobin 5.9, down from 12.6 in 2018.  Fecal occult blood testing is positive.  2 units of packed red blood cells were ordered for transfusion and gastroenterology was consulted by the ED physician, recommending a medical admission.  Review of Systems:  All other systems reviewed and apart from HPI, are negative.  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 His smoking use included cigarettes. He has a 90.00 pack-year smoking history. He has never used smokeless tobacco. He reports that he does not drink alcohol or use drugs.  Allergies  Allergen Reactions   Levaquin [Levofloxacin In D5w]     Pt states he is not allergic to medication, last took it about 8 years ago   Lisinopril Swelling    Family History  Problem Relation Age of Onset   Heart disease Mother    Cancer Neg Hx      Prior to Admission medications   Medication Sig Start Date End Date Taking? Authorizing Provider  albuterol (PROAIR HFA) 108 (90 BASE) MCG/ACT inhaler INHALE 2 PUFFS BY MOUTH EVERY 4 HOURS AS NEEDED FOR WHEEZING 03/13/15   Tanda Rockers, MD  allopurinol (ZYLOPRIM) 100 MG tablet Take 1 tablet (100 mg total) by mouth daily. 09/26/16   Tanda Rockers, MD  amLODipine (NORVASC) 10 MG tablet Take 1 tablet (10 mg total) by mouth daily. 09/26/16   Tanda Rockers, MD  atorvastatin (LIPITOR) 20 MG tablet Take 1 tablet (20 mg total) by mouth daily. 09/26/16   Tanda Rockers, MD  bisoprolol (ZEBETA) 5 MG tablet Take 1 tablet (5 mg total) by  mouth daily. 09/26/16   Tanda Rockers, MD  budesonide-formoterol (SYMBICORT) 160-4.5 MCG/ACT inhaler INHALE 2 PUFFS BY MOUTH EVERY 12 HOURS (FIRST THING IN THE MORNING THEN 12 HOURS LATER) 03/24/17   Tanda Rockers, MD  cloNIDine (CATAPRES) 0.1 MG tablet Take 1 tablet (0.1 mg total) by mouth 2 (two) times daily. 12/17/16   Tanda Rockers, MD  colchicine 0.6 MG tablet  08/01/17   [provider]  furosemide (LASIX) 20 MG tablet Take 1 tablet (20 mg total) by mouth daily. 12/17/16   Tanda Rockers, MD  spironolactone (ALDACTONE) 25 MG tablet TAKE  1/2 TABLET BY MOUTH EVERY DAY 12/17/16   Tanda Rockers, MD    Physical Exam: Vitals:   04/08/19 0251 04/08/19 0256 04/08/19 0300 04/08/19 0430  BP: 105/79  122/66 111/65  Pulse: 93  87   Resp: (!) 22  (!) 22 (!) 21  Temp: 98.1 F (36.7 C)     TempSrc: Oral     SpO2:  90% 98%     Constitutional: NAD, calm  Eyes: PERTLA, lids and conjunctivae normal ENMT: Mucous membranes are moist. Posterior pharynx clear of any exudate or lesions.   Neck: normal, supple, no masses, no thyromegaly Respiratory: Breath sound diminished bilaterally. No accessory muscle use.  Cardiovascular: S1 & S2 heard, regular rate and rhythm No extremity edema.   Abdomen: Soft, no tenderness. Bowel sounds activity.  Musculoskeletal: no clubbing / cyanosis. No joint deformity upper and lower extremities.    Skin: Poor turgor. Warm, dry, well-perfused. Neurologic: No facial asymmetry. Sensation intact. Moving all extremitiess.  Psychiatric: Alert and oriented to person, place, and situation. Pleasant and cooperative.     Labs on Admission: I have personally reviewed following labs and imaging studies  CBC: Recent Labs  Lab 04/08/19 0310  WBC 18.7*  NEUTROABS 14.1*  HGB 5.9*  HCT 20.3*  MCV 108.6*  PLT 354   Basic Metabolic Panel: Recent Labs  Lab 04/08/19 0310  NA 137  K 4.5  CL 105  CO2 17*  GLUCOSE 232*  BUN 51*  CREATININE 1.73*  CALCIUM 8.5*   GFR: CrCl cannot be calculated (Unknown ideal weight.). Liver Function Tests: Recent Labs  Lab 04/08/19 0310  AST 18  ALT 12  ALKPHOS 71  BILITOT 0.6  PROT 5.8*  ALBUMIN 2.8*   No results for input(s): LIPASE, AMYLASE in the last 168 hours. No results for input(s): AMMONIA in the last 168 hours. Coagulation Profile: Recent Labs  Lab 04/08/19 0410  INR 1.3*   Cardiac Enzymes: Recent Labs  Lab 04/08/19 0310  TROPONINI <0.03   BNP (last 3 results) No results for input(s): PROBNP in the last 8760 hours. HbA1C: No results for  input(s): HGBA1C in the last 72 hours. CBG: No results for input(s): GLUCAP in the last 168 hours. Lipid Profile: No results for input(s): CHOL, HDL, LDLCALC, TRIG, CHOLHDL, LDLDIRECT in the last 72 hours. Thyroid Function Tests: No results for input(s): TSH, T4TOTAL, FREET4, T3FREE, THYROIDAB in the last 72 hours. Anemia Panel: No results for input(s): VITAMINB12, FOLATE, FERRITIN, TIBC, IRON, RETICCTPCT in the last 72 hours. Urine analysis:    Component Value Date/Time   COLORURINE YELLOW 04/03/2017 2005   APPEARANCEUR CLEAR 04/03/2017 2005   LABSPEC 1.016 04/03/2017 2005   PHURINE 5.0 04/03/2017 2005   GLUCOSEU NEGATIVE 04/03/2017 2005   HGBUR NEGATIVE 04/03/2017 2005   [place] NEGATIVE 04/03/2017 2005   [place] 04/03/2017 2005   PROTEINUR NEGATIVE 04/03/2017 2005  UROBILINOGEN 1.0 02/22/2012 0238   NITRITE NEGATIVE 04/03/2017 2005   LEUKOCYTESUR NEGATIVE 04/03/2017 2005   Sepsis Labs: @LABRCNTIP (procalcitonin:4,lacticidven:4) )No results found for this or any previous visit (from the past 240 hour(s)).   Radiological Exams on Admission: Dg Chest 2 View  Result Date: 04/08/2019 CLINICAL DATA:  Initial evaluation for acute syncope. EXAM: CHEST - 2 VIEW COMPARISON:  Prior radiograph from 10/06/2017 FINDINGS: Mild cardiomegaly.  Mediastinal silhouette normal. Lungs normally inflated. There is a 5.4 cm ovoid masslike opacity at the right lower lobe, new from previous. Additional irregular densities within the mid and lower right lung otherwise grossly similar. Minimal linear atelectasis and/or scarring at the left lung base. No other consolidative opacity. No edema or effusion. No pneumothorax. No acute osseous finding. Multilevel degenerative spurring noted throughout the visualized spine. IMPRESSION: 1. New 5.4 cm right lower lobe mass, indeterminate. Further evaluation with dedicated cross-sectional imaging of the chest recommended. 2. Cardiomegaly. Electronically  Signed   By: Jeannine Boga M.D.   On: 04/08/2019 03:36   Ct Head Wo Contrast  Result Date: 04/08/2019 CLINICAL DATA:  Head trauma, minor, GCS>=13, high clinical risk, initial exam. Syncopal episode at home. EXAM: CT HEAD WITHOUT CONTRAST CT CERVICAL SPINE WITHOUT CONTRAST TECHNIQUE: Multidetector CT imaging of the head and cervical spine was performed following the standard protocol without intravenous contrast. Multiplanar CT image reconstructions of the cervical spine were also generated. COMPARISON:  None. FINDINGS: CT HEAD FINDINGS Brain: No intracranial hemorrhage, mass effect, or midline shift. Age related atrophy. Moderate to advanced chronic small vessel ischemia. No hydrocephalus. The basilar cisterns are patent. No evidence of territorial infarct or acute ischemia. No extra-axial or intracranial fluid collection. Vascular: Atherosclerosis of skullbase vasculature without hyperdense vessel or abnormal calcification. Skull: No fracture or focal lesion. Sinuses/Orbits: Mucosal thickening of the ethmoid air cells. Remote medial left orbital fracture. Frontal sinuses are hypo pneumatized. Mastoid air cells are clear. Other: None. CT CERVICAL SPINE FINDINGS Alignment: Slight straightening of normal lordosis. No traumatic subluxation. Skull base and vertebrae: No acute fracture. Vertebral body heights are maintained. The dens and skull base are intact. Soft tissues and spinal canal: No prevertebral fluid or swelling. No visible canal hematoma. Disc levels: Disc space narrowing and endplate spurring throughout, most prominent at C5-C6. Mild scattered facet arthropathy. Upper chest: No acute findings. Other: Dense carotid calcifications. IMPRESSION: 1. No acute intracranial abnormality. No skull fracture. 2. Age related atrophy. Moderate to advanced chronic small vessel ischemia. 3. Degenerative change in the cervical spine without acute fracture or subluxation. 4. Carotid and skullbase atherosclerosis.  Electronically Signed   By: Keith Rake M.D.   On: 04/08/2019 03:32   Ct Cervical Spine Wo Contrast  Result Date: 04/08/2019 CLINICAL DATA:  Head trauma, minor, GCS>=13, high clinical risk, initial exam. Syncopal episode at home. EXAM: CT HEAD WITHOUT CONTRAST CT CERVICAL SPINE WITHOUT CONTRAST TECHNIQUE: Multidetector CT imaging of the head and cervical spine was performed following the standard protocol without intravenous contrast. Multiplanar CT image reconstructions of the cervical spine were also generated. COMPARISON:  None. FINDINGS: CT HEAD FINDINGS Brain: No intracranial hemorrhage, mass effect, or midline shift. Age related atrophy. Moderate to advanced chronic small vessel ischemia. No hydrocephalus. The basilar cisterns are patent. No evidence of territorial infarct or acute ischemia. No extra-axial or intracranial fluid collection. Vascular: Atherosclerosis of skullbase vasculature without hyperdense vessel or abnormal calcification. Skull: No fracture or focal lesion. Sinuses/Orbits: Mucosal thickening of the ethmoid air cells. Remote medial left orbital fracture. Frontal  sinuses are hypo pneumatized. Mastoid air cells are clear. Other: None. CT CERVICAL SPINE FINDINGS Alignment: Slight straightening of normal lordosis. No traumatic subluxation. Skull base and vertebrae: No acute fracture. Vertebral body heights are maintained. The dens and skull base are intact. Soft tissues and spinal canal: No prevertebral fluid or swelling. No visible canal hematoma. Disc levels: Disc space narrowing and endplate spurring throughout, most prominent at C5-C6. Mild scattered facet arthropathy. Upper chest: No acute findings. Other: Dense carotid calcifications. IMPRESSION: 1. No acute intracranial abnormality. No skull fracture. 2. Age related atrophy. Moderate to advanced chronic small vessel ischemia. 3. Degenerative change in the cervical spine without acute fracture or subluxation. 4. Carotid and  skullbase atherosclerosis. Electronically Signed   By: Keith Rake M.D.   On: 04/08/2019 03:32    EKG: Ordered, not yet performed.    Assessment/Plan   1. Syncope  - Presents after a syncopal episode that occurred while he was moving his bowels, and he had a second syncopal episode upon standing with EMS  - There was no chest pain or palpitations associated with this  - Check orthostatic vitals, check EKG, continue cardiac monitoring, continue IVF hydration, and check echocardiogram if EKG abnormal    2. Macrocytic anemia; occult GI bleeding  - Hgb is 5.9 on admission, down from 12.6 in 2018; MCV is 108.6, normal in 2018  - He denies melena or hematochezia, and denies abdominal pain  - FOBT is positive in ED  - 2 units RBC ordered  - GI is consulting and much appreciated  - Check anemia panel and post-transfusion CBC, and keep NPO pending GI eval and plan    3. Renal insufficiency  - SCr is 1.73 on admission, up from 1.19 in 2018  - Chronicity unclear, will check urine chemistries, renally-dose medications, avoid nephrotoxins, and repeat chem panel in am    4. Lung mass  - CXR features a 5.4 cm RLL mass  - Patient has history of non small cell lung cancer s/p curative radiotherapy to RML nodule and hilar adenopathy, was following with oncology for surveillance with chest CT q55mos, but appears to have stopped following  - Address new renal insufficiency as above and consider contrast-enhanced CT chest   5. COPD; chronic hypoxic respiratory failure  - Patient uses 2 Lpm supplemental O2 at baseline, reports his chronic dyspnea is stable  - Continue ICS/LABA, supplemental O2, and as-needed albuterol    6. Leukocytosis  - WBC is 18,700 on admission without fever or infectious s/s  - Culture if febrile    7. Hypertension  - BP at goal   8. CAD  - No anginal complaints  - Pharmacy med-rec pending   9. Hyperglycemia  - Serum glucose is 232 on admission  - A1c was normal  remotely  - Check CBG's and use a low-intensity SSI with Novolog as needed    10. Chronic diastolic CHF  - Appears hypovolemic on admission  - Continue IVF hydration and transfusions    PPE: Mask and face shield worn during encounter  DVT prophylaxis: SCD's  Code Status: Full  Family Communication: Discussed with patient  Consults called: GI consulted by ED physician  Admission status: Observation     Vianne Bulls, MD [facility] Pager [phone]  If 7PM-7AM, please contact night-coverage [email or web address] Password [email or web address]  04/08/2019, 5:06 AM

## 2019-04-09 LAB — GLUCOSE, CAPILLARY
Glucose-Capillary: 121 mg/dL — ABNORMAL HIGH (ref 70–99)
Glucose-Capillary: 123 mg/dL — ABNORMAL HIGH (ref 70–99)
Glucose-Capillary: 126 mg/dL — ABNORMAL HIGH (ref 70–99)
Glucose-Capillary: 165 mg/dL — ABNORMAL HIGH (ref 70–99)

## 2019-04-09 LAB — CBC
HCT: 22 % — ABNORMAL LOW (ref 39.0–52.0)
Hemoglobin: 7.2 g/dL — ABNORMAL LOW (ref 13.0–17.0)
MCH: 33.3 pg (ref 26.0–34.0)
MCHC: 32.7 g/dL (ref 30.0–36.0)
MCV: 101.9 fL — ABNORMAL HIGH (ref 80.0–100.0)
Platelets: 261 10*3/uL (ref 150–400)
RBC: 2.16 MIL/uL — ABNORMAL LOW (ref 4.22–5.81)
RDW: 17.8 % — ABNORMAL HIGH (ref 11.5–15.5)
WBC: 15.5 10*3/uL — ABNORMAL HIGH (ref 4.0–10.5)
nRBC: 14.1 % — ABNORMAL HIGH (ref 0.0–0.2)

## 2019-04-09 LAB — BASIC METABOLIC PANEL
Anion gap: 11 (ref 5–15)
BUN: 40 mg/dL — ABNORMAL HIGH (ref 8–23)
CO2: 22 mmol/L (ref 22–32)
Calcium: 8.7 mg/dL — ABNORMAL LOW (ref 8.9–10.3)
Chloride: 105 mmol/L (ref 98–111)
Creatinine, Ser: 1.52 mg/dL — ABNORMAL HIGH (ref 0.61–1.24)
GFR calc Af Amer: 52 mL/min — ABNORMAL LOW (ref >60)
GFR calc non Af Amer: 44 mL/min — ABNORMAL LOW (ref >60)
Glucose, Bld: 136 mg/dL — ABNORMAL HIGH (ref 70–99)
Potassium: 4 mmol/L (ref 3.5–5.1)
Sodium: 138 mmol/L (ref 135–145)

## 2019-04-09 LAB — PATHOLOGIST SMEAR REVIEW

## 2019-04-09 MED ORDER — FOLIC ACID 1 MG PO TABS: 1.0000 mg | ORAL_TABLET | Freq: Every day | ORAL | 11 refills | Status: AC

## 2019-04-09 MED ORDER — VITAMIN B-12 1000 MCG PO TABS: 1000.0000 ug | ORAL_TABLET | Freq: Every day | ORAL | 0 refills | Status: AC

## 2019-04-09 MED FILL — B-12 1000 MCG TABS: 1000 | 30 days supply | Qty: 30 | Fill #0

## 2019-04-09 MED FILL — FOLIC ACID 1 MG TABS: 1 | 30 days supply | Qty: 30 | Fill #0

## 2019-04-09 NOTE — Discharge Summary (Signed)
Physician Discharge Summary  John [name] PJK:[id number] DOB: January 11, 1946 DOA: 04/08/2019  PCP: No primary care provider on file.  Admit date: 04/08/2019 Discharge date: 04/09/2019  Admitted From: Home Disposition:  Home  Recommendations for Outpatient Follow-up:  1. Follow up with PCP in 1-2 weeks 2. Recommend repeat CBC in 1-2 weeks 3. Follow up with Oncology (Dr. Earlie Server) as scheduled  Discharge Condition:Improved CODE STATUS:Full Diet recommendation: Heart healthy   Brief/Interim Summary: 73 y.o.malewith medical history significant forhypertension, coronary artery disease, chronic diastolic CHF, COPD, chronic hypoxic respiratory failure, and non-small cell lung cancer status post radiotherapy, now presenting to the emergency department for evaluation of syncope.Patient reports the insidious development of fatigue over the course of months, then developed dark stool about 3 days ago. He denies any significant abdominal pain, denies vomiting, reports occasional loose stools. Denies chest pain, palpitations, fevers, or chills. He has not seen his oncologist in a couple years. Reports chronic DOE that is unchanged. Reports history of leg swelling, but none recently.  ED Course:Upon arrival to the ED, patient is found to be afebrile, saturating low 90s on room air, and with vitals otherwise normal. Chest x-ray features a new 5.4 cm right lower lobe mass. Noncontrast head CT is negative for acute intracranial abnormality or skull fracture, and cervical spine CT is negative for acute fracture or subluxation. Chemistry panel is notable for bicarbonate of 17, glucose 232, BUN 51, and creatinine 1.73, up from 1.19 in 2018. CBC is notable for leukocytosis to 18,700 and a macrocytic anemia with hemoglobin 5.9, down from 12.6 in 2018. Fecal occult blood testing is positive. 2 units of packed red blood cells were ordered for transfusion and gastroenterology was consulted by the ED physician,  recommending a medical admission.   Discharge Diagnoses:  Principal Problem:   Syncope and collapse Active Problems:   Chronic diastolic CHF (congestive heart failure) (HCC)   HTN (hypertension)   CAD (coronary artery disease)   Chronic respiratory failure with hypoxia (HCC)   COPD II/III with reversibility    Renal insufficiency   Right lower lobe lung mass   Symptomatic anemia   Occult GI bleeding   Hyperglycemia   Leukocytosis   Low hemoglobin   Syncope   1.Syncope -Presents after a syncopal episode that occurred while he was moving his bowels, and he had a second syncopal episode upon standing with EMS -Pt had denied chest pains -Cont on hydration and given PRBC transfusion - Suspect syncopal episode as a result of profound anemia, per below - Given concern over new lung mass, underwent MRI brain. No mets noted  2.Macrocytic anemia. Occult bleeding ruled out -Hgb is 5.9 on admission, down from 12.6 in 2018; MCV is 108.6, normal in 2018 -He denies melena or hematochezia, and denies abdominal pain -FOBT is positive in ED -2 units RBC ordered - GI is consulted, considering EGD vs colon as outpatient -B12 and folate low. Will prescribe replacement on d/c  3.Renal insufficiency -SCr is 1.73 on admission, up from 1.19 in 2018 -Chronicity unclear, will check urine chemistries, renally-dose medications, avoid nephrotoxins  4.Lung mass  - CXR features a 5.4 cm RLL mass, new finding - Patient has history of non small cell lung cancer s/p curative radiotherapy to RML nodule and hilar adenopathy, was following with oncology for surveillance with chest CT q55mos - On minimal O2 support currently  -Have alerted primary oncologist to above finding electronically. Recommend close Oncology follow up  5. COPD; chronic hypoxic respiratory failure -Patient  uses 2 Lpm supplemental O2 at baseline, reports his chronic dyspnea is stable -Continue ICS/LABA,  supplemental O2, and as-needed albuterol  - Stable at present  6.Leukocytosis -WBC is 18,700 on admission without fever or infectious s/s -Afebrile, WBC trended down to 15k  7.Hypertension -BP presently stable and controlled -given stable bp, will held norvasc, spironolactone, lasix -continued bisoprolol given CAD and cont clonidine to prevent rebound HTN  8.CAD -No anginal complaints  9.Hyperglycemia  - Serum glucose is 232 on admission -A1c was normal remotely -cont ssi coverage as needed while in hospital  10. Chronic diastolic CHF  -Appears hypovolemic on admission  -Stable   Discharge Instructions   Allergies as of 04/09/2019      Reactions   Levaquin [levofloxacin In D5w]    Pt states he is not allergic to medication, last took it about 8 years ago   Lisinopril Swelling      Medication List    STOP taking these medications   amLODipine 10 MG tablet Commonly known as:  NORVASC   spironolactone 25 MG tablet Commonly known as:  ALDACTONE     TAKE these medications   albuterol 108 (90 Base) MCG/ACT inhaler Commonly known as:  ProAir HFA INHALE 2 PUFFS BY MOUTH EVERY 4 HOURS AS NEEDED FOR WHEEZING What changed:    how much to take  how to take this  when to take this  reasons to take this  additional instructions   allopurinol 100 MG tablet Commonly known as:  ZYLOPRIM Take 1 tablet (100 mg total) by mouth daily.   atorvastatin 20 MG tablet Commonly known as:  LIPITOR Take 1 tablet (20 mg total) by mouth daily.   bisoprolol 5 MG tablet Commonly known as:  ZEBETA Take 1 tablet (5 mg total) by mouth daily.   budesonide-formoterol 160-4.5 MCG/ACT inhaler Commonly known as:  Symbicort INHALE 2 PUFFS BY MOUTH EVERY 12 HOURS (FIRST THING IN THE MORNING THEN 12 HOURS LATER) What changed:    how much to take  how to take this  when to take this  reasons to take this  additional instructions   cloNIDine 0.1 MG  tablet Commonly known as:  CATAPRES Take 1 tablet (0.1 mg total) by mouth 2 (two) times daily.   colchicine 0.6 MG tablet Take 0.6 mg by mouth daily.   folic acid 1 MG tablet Commonly known as:  FOLVITE Take 1 tablet (1 mg total) by mouth daily.   furosemide 20 MG tablet Commonly known as:  LASIX Take 1 tablet (20 mg total) by mouth daily.   metFORMIN 1000 MG tablet Commonly known as:  GLUCOPHAGE Take 1,000 mg by mouth 2 (two) times daily.   vitamin B-12 1000 MCG tablet Commonly known as:  CYANOCOBALAMIN Take 1 tablet (1,000 mcg total) by mouth daily for 30 days.   Vitamin D (Ergocalciferol) 1.25 MG (50000 UT) Caps capsule Commonly known as:  DRISDOL Take 1 capsule by mouth once a week.       Allergies  Allergen Reactions  . Levaquin [Levofloxacin In D5w]     Pt states he is not allergic to medication, last took it about 8 years ago  . Lisinopril Swelling    Consultations: GI  Procedures/Studies: Dg Chest 2 View  Result Date: 04/08/2019 CLINICAL DATA:  Initial evaluation for acute syncope. EXAM: CHEST - 2 VIEW COMPARISON:  Prior radiograph from 10/06/2017 FINDINGS: Mild cardiomegaly.  Mediastinal silhouette normal. Lungs normally inflated. There is a 5.4 cm ovoid masslike opacity  at the right lower lobe, new from previous. Additional irregular densities within the mid and lower right lung otherwise grossly similar. Minimal linear atelectasis and/or scarring at the left lung base. No other consolidative opacity. No edema or effusion. No pneumothorax. No acute osseous finding. Multilevel degenerative spurring noted throughout the visualized spine. IMPRESSION: 1. New 5.4 cm right lower lobe mass, indeterminate. Further evaluation with dedicated cross-sectional imaging of the chest recommended. 2. Cardiomegaly. Electronically Signed   By: Jeannine Boga M.D.   On: 04/08/2019 03:36   Ct Head Wo Contrast  Result Date: 04/08/2019 CLINICAL DATA:  Head trauma, minor,  GCS>=13, high clinical risk, initial exam. Syncopal episode at home. EXAM: CT HEAD WITHOUT CONTRAST CT CERVICAL SPINE WITHOUT CONTRAST TECHNIQUE: Multidetector CT imaging of the head and cervical spine was performed following the standard protocol without intravenous contrast. Multiplanar CT image reconstructions of the cervical spine were also generated. COMPARISON:  None. FINDINGS: CT HEAD FINDINGS Brain: No intracranial hemorrhage, mass effect, or midline shift. Age related atrophy. Moderate to advanced chronic small vessel ischemia. No hydrocephalus. The basilar cisterns are patent. No evidence of territorial infarct or acute ischemia. No extra-axial or intracranial fluid collection. Vascular: Atherosclerosis of skullbase vasculature without hyperdense vessel or abnormal calcification. Skull: No fracture or focal lesion. Sinuses/Orbits: Mucosal thickening of the ethmoid air cells. Remote medial left orbital fracture. Frontal sinuses are hypo pneumatized. Mastoid air cells are clear. Other: None. CT CERVICAL SPINE FINDINGS Alignment: Slight straightening of normal lordosis. No traumatic subluxation. Skull base and vertebrae: No acute fracture. Vertebral body heights are maintained. The dens and skull base are intact. Soft tissues and spinal canal: No prevertebral fluid or swelling. No visible canal hematoma. Disc levels: Disc space narrowing and endplate spurring throughout, most prominent at C5-C6. Mild scattered facet arthropathy. Upper chest: No acute findings. Other: Dense carotid calcifications. IMPRESSION: 1. No acute intracranial abnormality. No skull fracture. 2. Age related atrophy. Moderate to advanced chronic small vessel ischemia. 3. Degenerative change in the cervical spine without acute fracture or subluxation. 4. Carotid and skullbase atherosclerosis. Electronically Signed   By: Keith Rake M.D.   On: 04/08/2019 03:32   Ct Cervical Spine Wo Contrast  Result Date: 04/08/2019 CLINICAL DATA:   Head trauma, minor, GCS>=13, high clinical risk, initial exam. Syncopal episode at home. EXAM: CT HEAD WITHOUT CONTRAST CT CERVICAL SPINE WITHOUT CONTRAST TECHNIQUE: Multidetector CT imaging of the head and cervical spine was performed following the standard protocol without intravenous contrast. Multiplanar CT image reconstructions of the cervical spine were also generated. COMPARISON:  None. FINDINGS: CT HEAD FINDINGS Brain: No intracranial hemorrhage, mass effect, or midline shift. Age related atrophy. Moderate to advanced chronic small vessel ischemia. No hydrocephalus. The basilar cisterns are patent. No evidence of territorial infarct or acute ischemia. No extra-axial or intracranial fluid collection. Vascular: Atherosclerosis of skullbase vasculature without hyperdense vessel or abnormal calcification. Skull: No fracture or focal lesion. Sinuses/Orbits: Mucosal thickening of the ethmoid air cells. Remote medial left orbital fracture. Frontal sinuses are hypo pneumatized. Mastoid air cells are clear. Other: None. CT CERVICAL SPINE FINDINGS Alignment: Slight straightening of normal lordosis. No traumatic subluxation. Skull base and vertebrae: No acute fracture. Vertebral body heights are maintained. The dens and skull base are intact. Soft tissues and spinal canal: No prevertebral fluid or swelling. No visible canal hematoma. Disc levels: Disc space narrowing and endplate spurring throughout, most prominent at C5-C6. Mild scattered facet arthropathy. Upper chest: No acute findings. Other: Dense carotid calcifications. IMPRESSION: 1. No  acute intracranial abnormality. No skull fracture. 2. Age related atrophy. Moderate to advanced chronic small vessel ischemia. 3. Degenerative change in the cervical spine without acute fracture or subluxation. 4. Carotid and skullbase atherosclerosis. Electronically Signed   By: Keith Rake M.D.   On: 04/08/2019 03:32   Mr Jeri Cos QQ Contrast  Result Date:  04/08/2019 CLINICAL DATA:  New lung mass and seizure. EXAM: MRI HEAD WITHOUT AND WITH CONTRAST TECHNIQUE: Multiplanar, multiecho pulse sequences of the brain and surrounding structures were obtained without and with intravenous contrast. CONTRAST:  8 mL Gadavist COMPARISON:  Head CT 04/08/2019 FINDINGS: BRAIN: There is no acute infarct, acute hemorrhage or extra-axial collection. The midline structures are normal. 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 Diffuse confluent hyperintense T2-weighted signal within the periventricular, deep and juxtacortical white matter, most commonly due to chronic ischemic microangiopathy. Generalized atrophy without lobar predilection. Susceptibility-sensitive sequences show no chronic microhemorrhage or superficial siderosis. No abnormal contrast enhancement. VASCULAR: The major intracranial arterial and venous sinus flow voids are normal. SKULL AND UPPER CERVICAL SPINE: Calvarial bone marrow signal is normal. There is no skull base mass. Visualized upper cervical spine and soft tissues are normal. SINUSES/ORBITS: No fluid levels or advanced mucosal thickening. No mastoid or middle ear effusion. The orbits are normal. IMPRESSION: 1. No intracranial metastatic disease. 2. Chronic ischemic microangiopathy. Electronically Signed   By: Ulyses Jarred M.D.   On: 04/08/2019 19:59    Subjective: Eager to go home  Discharge Exam: Vitals:   04/09/19 0736 04/09/19 0849  BP: (!) 153/74 (!) 101/59  Pulse: 84 78  Resp: 18 18  Temp: 98.4 F (36.9 C) 98.4 F (36.9 C)  SpO2: 98% 100%   Vitals:   04/09/19 0335 04/09/19 0500 04/09/19 0736 04/09/19 0849  BP: (!) 144/73 (!) 111/54 (!) 153/74 (!) 101/59  Pulse: 79 77 84 78  Resp: 18 20 18 18   Temp: 97.9 F (36.6 C) 98.6 F (37 C) 98.4 F (36.9 C) 98.4 F (36.9 C)  TempSrc: Oral Oral Oral Oral  SpO2: 92% 98% 98% 100%  Weight:  88.7 kg    Height:        General: Pt is alert, awake, not in acute distress Cardiovascular: RRR, S1/S2  +, no rubs, no gallops Respiratory: CTA bilaterally, no wheezing, no rhonchi Abdominal: Soft, NT, ND, bowel sounds + Extremities: no edema, no cyanosis   The results of significant diagnostics from this hospitalization (including imaging, microbiology, ancillary and laboratory) are listed below for reference.     Microbiology: No results found for this or any previous visit (from the past 240 hour(s)).   Labs: BNP (last 3 results) Recent Labs    04/08/19 0310  BNP 76.1   Basic Metabolic Panel: Recent Labs  Lab 04/08/19 0310 04/09/19 0447  NA 137 138  K 4.5 4.0  CL 105 105  CO2 17* 22  GLUCOSE 232* 136*  BUN 51* 40*  CREATININE 1.73* 1.52*  CALCIUM 8.5* 8.7*   Liver Function Tests: Recent Labs  Lab 04/08/19 0310  AST 18  ALT 12  ALKPHOS 71  BILITOT 0.6  PROT 5.8*  ALBUMIN 2.8*   No results for input(s): LIPASE, AMYLASE in the last 168 hours. No results for input(s): AMMONIA in the last 168 hours. CBC: Recent Labs  Lab 04/08/19 0310 04/08/19 1401 04/09/19 0447  WBC 18.7*  --  15.5*  NEUTROABS 14.1*  --   --   HGB 5.9* 7.8* 7.2*  HCT 20.3* 25.0* 22.0*  MCV  108.6*  --  101.9*  PLT 312  --  261   Cardiac Enzymes: Recent Labs  Lab 04/08/19 0310  TROPONINI <0.03   BNP: Invalid input(s): POCBNP CBG: Recent Labs  Lab 04/08/19 1614 04/08/19 2021 04/09/19 0017 04/09/19 0503 04/09/19 0912  GLUCAP 144* 138* 126* 121* 165*   D-Dimer No results for input(s): DDIMER in the last 72 hours. Hgb A1c Recent Labs    04/08/19 0518  HGBA1C 5.8*   Lipid Profile No results for input(s): CHOL, HDL, LDLCALC, TRIG, CHOLHDL, LDLDIRECT in the last 72 hours. Thyroid function studies No results for input(s): TSH, T4TOTAL, T3FREE, THYROIDAB in the last 72 hours.  Invalid input(s): FREET3 Anemia work up Recent Labs    04/08/19 0518  VITAMINB12 88*  FOLATE 4.8*  FERRITIN 80  TIBC 315  IRON 82  RETICCTPCT 9.2*   Urinalysis    Component Value  Date/Time   COLORURINE YELLOW 04/03/2017 2005   APPEARANCEUR CLEAR 04/03/2017 2005   LABSPEC 1.016 04/03/2017 2005   PHURINE 5.0 04/03/2017 2005   GLUCOSEU NEGATIVE 04/03/2017 2005   HGBUR NEGATIVE 04/03/2017 2005   BILIRUBINUR NEGATIVE 04/03/2017 2005   KETONESUR NEGATIVE 04/03/2017 2005   PROTEINUR NEGATIVE 04/03/2017 2005   UROBILINOGEN 1.0 02/22/2012 0238   NITRITE NEGATIVE 04/03/2017 2005   LEUKOCYTESUR NEGATIVE 04/03/2017 2005   Sepsis Labs Invalid input(s): PROCALCITONIN,  WBC,  LACTICIDVEN Microbiology No results found for this or any previous visit (from the past 240 hour(s)).  Time spent: 30 min  SIGNED:   Marylu Lund, MD  [facility] 04/09/2019, 10:39 AM  If 7PM-7AM, please contact night-coverage

## 2019-04-12 LAB — TYPE AND SCREEN
ABO/RH(D): O POS
Antibody Screen: NEGATIVE
Unit division: 0
Unit division: 0

## 2019-04-12 LAB — BPAM RBC
Blood Product Expiration Date: 202004252359
Blood Product Expiration Date: 202004262359
ISSUE DATE / TIME: 202004090748
Unit Type and Rh: 5100
Unit Type and Rh: 5100

## 2019-04-13 ENCOUNTER — Telehealth: Payer: Self-pay | Admitting: Internal Medicine

## 2019-04-13 NOTE — Telephone Encounter (Signed)
Unable to reach patient - called per 4/11 sch message - mailed letter with appt date and time

## 2019-04-20 ENCOUNTER — Encounter (HOSPITAL_COMMUNITY): Payer: Self-pay | Admitting: Emergency Medicine

## 2019-04-20 ENCOUNTER — Inpatient Hospital Stay (HOSPITAL_COMMUNITY)
Admission: EM | Admit: 2019-04-20 | Discharge: 2019-04-24 | DRG: 378 | Disposition: A | Discharge status: Home / Self Care | Payer: Medicare Other | Attending: Family Medicine | Admitting: Family Medicine

## 2019-04-20 ENCOUNTER — Other Ambulatory Visit: Payer: Self-pay

## 2019-04-20 ENCOUNTER — Emergency Department (HOSPITAL_COMMUNITY): Payer: Medicare Other

## 2019-04-20 DIAGNOSIS — J9611 Chronic respiratory failure with hypoxia: Secondary | ICD-10-CM | POA: Diagnosis present

## 2019-04-20 DIAGNOSIS — K921 Melena: Secondary | ICD-10-CM | POA: Diagnosis not present

## 2019-04-20 DIAGNOSIS — I959 Hypotension, unspecified: Secondary | ICD-10-CM | POA: Diagnosis present

## 2019-04-20 DIAGNOSIS — N183 Chronic kidney disease, stage 3 unspecified: Secondary | ICD-10-CM | POA: Diagnosis present

## 2019-04-20 DIAGNOSIS — Z7951 Long term (current) use of inhaled steroids: Secondary | ICD-10-CM

## 2019-04-20 DIAGNOSIS — F0390 Unspecified dementia without behavioral disturbance: Secondary | ICD-10-CM | POA: Diagnosis present

## 2019-04-20 DIAGNOSIS — K2970 Gastritis, unspecified, without bleeding: Secondary | ICD-10-CM

## 2019-04-20 DIAGNOSIS — Z9181 History of falling: Secondary | ICD-10-CM

## 2019-04-20 DIAGNOSIS — E861 Hypovolemia: Secondary | ICD-10-CM | POA: Diagnosis present

## 2019-04-20 DIAGNOSIS — K264 Chronic or unspecified duodenal ulcer with hemorrhage: Secondary | ICD-10-CM | POA: Diagnosis not present

## 2019-04-20 DIAGNOSIS — D649 Anemia, unspecified: Secondary | ICD-10-CM | POA: Diagnosis present

## 2019-04-20 DIAGNOSIS — K8020 Calculus of gallbladder without cholecystitis without obstruction: Secondary | ICD-10-CM | POA: Diagnosis present

## 2019-04-20 DIAGNOSIS — E1122 Type 2 diabetes mellitus with diabetic chronic kidney disease: Secondary | ICD-10-CM | POA: Diagnosis present

## 2019-04-20 DIAGNOSIS — I10 Essential (primary) hypertension: Secondary | ICD-10-CM | POA: Diagnosis present

## 2019-04-20 DIAGNOSIS — D539 Nutritional anemia, unspecified: Secondary | ICD-10-CM | POA: Diagnosis present

## 2019-04-20 DIAGNOSIS — Z888 Allergy status to other drugs, medicaments and biological substances status: Secondary | ICD-10-CM

## 2019-04-20 DIAGNOSIS — I2510 Atherosclerotic heart disease of native coronary artery without angina pectoris: Secondary | ICD-10-CM | POA: Diagnosis present

## 2019-04-20 DIAGNOSIS — K259 Gastric ulcer, unspecified as acute or chronic, without hemorrhage or perforation: Secondary | ICD-10-CM | POA: Diagnosis not present

## 2019-04-20 DIAGNOSIS — E669 Obesity, unspecified: Secondary | ICD-10-CM | POA: Diagnosis present

## 2019-04-20 DIAGNOSIS — B9681 Helicobacter pylori [H. pylori] as the cause of diseases classified elsewhere: Secondary | ICD-10-CM | POA: Diagnosis present

## 2019-04-20 DIAGNOSIS — K260 Acute duodenal ulcer with hemorrhage: Secondary | ICD-10-CM | POA: Diagnosis present

## 2019-04-20 DIAGNOSIS — Z87891 Personal history of nicotine dependence: Secondary | ICD-10-CM

## 2019-04-20 DIAGNOSIS — K210 Gastro-esophageal reflux disease with esophagitis: Secondary | ICD-10-CM | POA: Diagnosis present

## 2019-04-20 DIAGNOSIS — I252 Old myocardial infarction: Secondary | ICD-10-CM

## 2019-04-20 DIAGNOSIS — Z6832 Body mass index (BMI) 32.0-32.9, adult: Secondary | ICD-10-CM

## 2019-04-20 DIAGNOSIS — I272 Pulmonary hypertension, unspecified: Secondary | ICD-10-CM | POA: Diagnosis present

## 2019-04-20 DIAGNOSIS — R739 Hyperglycemia, unspecified: Secondary | ICD-10-CM | POA: Diagnosis present

## 2019-04-20 DIAGNOSIS — Z8249 Family history of ischemic heart disease and other diseases of the circulatory system: Secondary | ICD-10-CM

## 2019-04-20 DIAGNOSIS — R6881 Early satiety: Secondary | ICD-10-CM | POA: Diagnosis present

## 2019-04-20 DIAGNOSIS — K209 Esophagitis, unspecified without bleeding: Secondary | ICD-10-CM

## 2019-04-20 DIAGNOSIS — I130 Hypertensive heart and chronic kidney disease with heart failure and stage 1 through stage 4 chronic kidney disease, or unspecified chronic kidney disease: Secondary | ICD-10-CM | POA: Diagnosis present

## 2019-04-20 DIAGNOSIS — J449 Chronic obstructive pulmonary disease, unspecified: Secondary | ICD-10-CM | POA: Diagnosis present

## 2019-04-20 DIAGNOSIS — M109 Gout, unspecified: Secondary | ICD-10-CM | POA: Diagnosis present

## 2019-04-20 DIAGNOSIS — D62 Acute posthemorrhagic anemia: Secondary | ICD-10-CM | POA: Diagnosis present

## 2019-04-20 DIAGNOSIS — E860 Dehydration: Secondary | ICD-10-CM | POA: Diagnosis present

## 2019-04-20 DIAGNOSIS — R55 Syncope and collapse: Secondary | ICD-10-CM | POA: Diagnosis present

## 2019-04-20 DIAGNOSIS — C3431 Malignant neoplasm of lower lobe, right bronchus or lung: Secondary | ICD-10-CM | POA: Diagnosis present

## 2019-04-20 DIAGNOSIS — K254 Chronic or unspecified gastric ulcer with hemorrhage: Secondary | ICD-10-CM | POA: Diagnosis present

## 2019-04-20 DIAGNOSIS — E538 Deficiency of other specified B group vitamins: Secondary | ICD-10-CM | POA: Diagnosis present

## 2019-04-20 DIAGNOSIS — Z9981 Dependence on supplemental oxygen: Secondary | ICD-10-CM

## 2019-04-20 DIAGNOSIS — E785 Hyperlipidemia, unspecified: Secondary | ICD-10-CM | POA: Diagnosis present

## 2019-04-20 DIAGNOSIS — Z7984 Long term (current) use of oral hypoglycemic drugs: Secondary | ICD-10-CM

## 2019-04-20 DIAGNOSIS — K922 Gastrointestinal hemorrhage, unspecified: Secondary | ICD-10-CM | POA: Diagnosis not present

## 2019-04-20 DIAGNOSIS — Z79899 Other long term (current) drug therapy: Secondary | ICD-10-CM

## 2019-04-20 DIAGNOSIS — Z881 Allergy status to other antibiotic agents status: Secondary | ICD-10-CM

## 2019-04-20 DIAGNOSIS — I5032 Chronic diastolic (congestive) heart failure: Secondary | ICD-10-CM | POA: Diagnosis present

## 2019-04-20 DIAGNOSIS — N179 Acute kidney failure, unspecified: Secondary | ICD-10-CM | POA: Diagnosis present

## 2019-04-20 DIAGNOSIS — Z923 Personal history of irradiation: Secondary | ICD-10-CM

## 2019-04-20 DIAGNOSIS — D72829 Elevated white blood cell count, unspecified: Secondary | ICD-10-CM | POA: Diagnosis present

## 2019-04-20 HISTORY — DX: Deficiency of other specified B group vitamins: E53.8

## 2019-04-20 HISTORY — DX: Type 2 diabetes mellitus without complications: E11.9

## 2019-04-20 LAB — COMPREHENSIVE METABOLIC PANEL
ALT: 12 U/L (ref 0–44)
ALT: 8 U/L (ref 0–44)
AST: 19 U/L (ref 15–41)
AST: 14 U/L — ABNORMAL LOW (ref 15–41)
Albumin: 2.8 g/dL — ABNORMAL LOW (ref 3.5–5.0)
Albumin: 2 g/dL — ABNORMAL LOW (ref 3.5–5.0)
Alkaline Phosphatase: 73 U/L (ref 38–126)
Alkaline Phosphatase: 55 U/L (ref 38–126)
Anion gap: 18 — ABNORMAL HIGH (ref 5–15)
Anion gap: 12 (ref 5–15)
BUN: 39 mg/dL — ABNORMAL HIGH (ref 8–23)
BUN: 34 mg/dL — ABNORMAL HIGH (ref 8–23)
CO2: 22 mmol/L (ref 22–32)
CO2: 17 mmol/L — ABNORMAL LOW (ref 22–32)
Calcium: 8.9 mg/dL (ref 8.9–10.3)
Calcium: 6.7 mg/dL — ABNORMAL LOW (ref 8.9–10.3)
Chloride: 96 mmol/L — ABNORMAL LOW (ref 98–111)
Chloride: 111 mmol/L (ref 98–111)
Creatinine, Ser: 1.97 mg/dL — ABNORMAL HIGH (ref 0.61–1.24)
Creatinine, Ser: 1.5 mg/dL — ABNORMAL HIGH (ref 0.61–1.24)
GFR calc Af Amer: 38 mL/min — ABNORMAL LOW (ref >60)
GFR calc Af Amer: 52 mL/min — ABNORMAL LOW (ref >60)
GFR calc non Af Amer: 33 mL/min — ABNORMAL LOW (ref >60)
GFR calc non Af Amer: 45 mL/min — ABNORMAL LOW (ref >60)
Glucose, Bld: 217 mg/dL — ABNORMAL HIGH (ref 70–99)
Glucose, Bld: 135 mg/dL — ABNORMAL HIGH (ref 70–99)
Potassium: 4.5 mmol/L (ref 3.5–5.1)
Potassium: 3.4 mmol/L — ABNORMAL LOW (ref 3.5–5.1)
Sodium: 136 mmol/L (ref 135–145)
Sodium: 140 mmol/L (ref 135–145)
Total Bilirubin: 1 mg/dL (ref 0.3–1.2)
Total Bilirubin: 0.6 mg/dL (ref 0.3–1.2)
Total Protein: 6.8 g/dL (ref 6.5–8.1)
Total Protein: 4.8 g/dL — ABNORMAL LOW (ref 6.5–8.1)

## 2019-04-20 LAB — PROTIME-INR
INR: 1.4 — ABNORMAL HIGH (ref 0.8–1.2)
Prothrombin Time: 16.9 seconds — ABNORMAL HIGH (ref 11.4–15.2)

## 2019-04-20 LAB — CBC WITH DIFFERENTIAL/PLATELET
Abs Immature Granulocytes: 0.12 10*3/uL — ABNORMAL HIGH (ref 0.00–0.07)
Basophils Absolute: 0 10*3/uL (ref 0.0–0.1)
Basophils Relative: 0 %
Eosinophils Absolute: 0.1 10*3/uL (ref 0.0–0.5)
Eosinophils Relative: 1 %
HCT: 26.2 % — ABNORMAL LOW (ref 39.0–52.0)
Hemoglobin: 7.9 g/dL — ABNORMAL LOW (ref 13.0–17.0)
Immature Granulocytes: 1 %
Lymphocytes Relative: 6 %
Lymphs Abs: 0.9 10*3/uL (ref 0.7–4.0)
MCH: 30.3 pg (ref 26.0–34.0)
MCHC: 30.2 g/dL (ref 30.0–36.0)
MCV: 100.4 fL — ABNORMAL HIGH (ref 80.0–100.0)
Monocytes Absolute: 1.2 10*3/uL — ABNORMAL HIGH (ref 0.1–1.0)
Monocytes Relative: 9 %
Neutro Abs: 11.6 10*3/uL — ABNORMAL HIGH (ref 1.7–7.7)
Neutrophils Relative %: 83 %
Platelets: 557 10*3/uL — ABNORMAL HIGH (ref 150–400)
RBC: 2.61 MIL/uL — ABNORMAL LOW (ref 4.22–5.81)
RDW: 17.5 % — ABNORMAL HIGH (ref 11.5–15.5)
WBC: 13.9 10*3/uL — ABNORMAL HIGH (ref 4.0–10.5)
nRBC: 0.3 % — ABNORMAL HIGH (ref 0.0–0.2)

## 2019-04-20 LAB — MAGNESIUM: Magnesium: 1.4 mg/dL — ABNORMAL LOW (ref 1.7–2.4)

## 2019-04-20 LAB — APTT: aPTT: 32 seconds (ref 24–36)

## 2019-04-20 LAB — GLUCOSE, CAPILLARY
Glucose-Capillary: 163 mg/dL — ABNORMAL HIGH (ref 70–99)
Glucose-Capillary: 79 mg/dL (ref 70–99)
Glucose-Capillary: 124 mg/dL — ABNORMAL HIGH (ref 70–99)
Glucose-Capillary: 143 mg/dL — ABNORMAL HIGH (ref 70–99)
Glucose-Capillary: 142 mg/dL — ABNORMAL HIGH (ref 70–99)

## 2019-04-20 LAB — PREPARE RBC (CROSSMATCH)

## 2019-04-20 LAB — TROPONIN I: Troponin I: <0.03 ng/mL (ref <0.03)

## 2019-04-20 LAB — MRSA PCR SCREENING: MRSA by PCR: NEGATIVE

## 2019-04-20 LAB — HEMOGLOBIN: Hemoglobin: 6.5 g/dL — CL (ref 13.0–17.0)

## 2019-04-20 LAB — HEMOGLOBIN AND HEMATOCRIT, BLOOD
HCT: 26.9 % — ABNORMAL LOW (ref 39.0–52.0)
Hemoglobin: 8.6 g/dL — ABNORMAL LOW (ref 13.0–17.0)

## 2019-04-20 LAB — POC OCCULT BLOOD, ED: Fecal Occult Bld: POSITIVE — AB

## 2019-04-20 LAB — HEMATOCRIT: HCT: 22.4 % — ABNORMAL LOW (ref 39.0–52.0)

## 2019-04-20 MED ORDER — SODIUM CHLORIDE 0.9 % IV SOLN
INTRAVENOUS | Status: AC
  Administered 2019-04-20: 05:00:00 via INTRAVENOUS

## 2019-04-20 MED ORDER — SODIUM CHLORIDE 0.9 % IV BOLUS (SEPSIS)
500.0000 mL | Freq: Once | INTRAVENOUS | Status: AC
  Administered 2019-04-20: 500 mL via INTRAVENOUS

## 2019-04-20 MED ORDER — SODIUM CHLORIDE 0.9 % IV BOLUS: 500.0000 mL | Freq: Once | INTRAVENOUS | Status: DC

## 2019-04-20 MED ORDER — SODIUM CHLORIDE 0.9% FLUSH
3.0000 mL | Freq: Two times a day (BID) | INTRAVENOUS | Status: DC
  Administered 2019-04-20 – 2019-04-24 (×7): 3 mL via INTRAVENOUS

## 2019-04-20 MED ORDER — MOMETASONE FURO-FORMOTEROL FUM 200-5 MCG/ACT IN AERO
2.0000 | INHALATION_SPRAY | Freq: Two times a day (BID) | RESPIRATORY_TRACT | Status: DC
  Administered 2019-04-20 – 2019-04-24 (×8): 2 via RESPIRATORY_TRACT
  Filled 2019-04-20: qty 8.8

## 2019-04-20 MED ORDER — SODIUM CHLORIDE 0.9% IV SOLUTION
Freq: Once | INTRAVENOUS | Status: AC
  Administered 2019-04-20: 07:00:00 via INTRAVENOUS

## 2019-04-20 MED ORDER — SODIUM CHLORIDE 0.9 % IV SOLN: INTRAVENOUS | Status: DC

## 2019-04-20 MED ORDER — FOLIC ACID 1 MG PO TABS
1.0000 mg | ORAL_TABLET | Freq: Every day | ORAL | Status: DC
  Administered 2019-04-20 – 2019-04-24 (×5): 1 mg via ORAL
  Filled 2019-04-20 (×5): qty 1

## 2019-04-20 MED ORDER — INSULIN ASPART 100 UNIT/ML ~~LOC~~ SOLN
0.0000 [IU] | SUBCUTANEOUS | Status: DC
  Administered 2019-04-20: 2 [IU] via SUBCUTANEOUS
  Administered 2019-04-20 (×3): 1 [IU] via SUBCUTANEOUS

## 2019-04-20 MED ORDER — ONDANSETRON HCL 4 MG PO TABS: 4.0000 mg | ORAL_TABLET | Freq: Four times a day (QID) | ORAL | Status: DC | PRN

## 2019-04-20 MED ORDER — SODIUM CHLORIDE 0.9 % IV SOLN
8.0000 mg/h | INTRAVENOUS | Status: DC
  Administered 2019-04-20: 8 mg/h via INTRAVENOUS
  Filled 2019-04-20 (×2): qty 80

## 2019-04-20 MED ORDER — POTASSIUM CHLORIDE CRYS ER 20 MEQ PO TBCR
40.0000 meq | EXTENDED_RELEASE_TABLET | Freq: Once | ORAL | Status: AC
  Administered 2019-04-20: 40 meq via ORAL
  Filled 2019-04-20: qty 2

## 2019-04-20 MED ORDER — MAGNESIUM SULFATE 4 GM/100ML IV SOLN
4.0000 g | Freq: Once | INTRAVENOUS | Status: AC
  Administered 2019-04-20: 4 g via INTRAVENOUS
  Filled 2019-04-20 (×2): qty 100

## 2019-04-20 MED ORDER — SODIUM CHLORIDE 0.9 % IV BOLUS
1000.0000 mL | Freq: Once | INTRAVENOUS | Status: AC
  Administered 2019-04-20: 1000 mL via INTRAVENOUS

## 2019-04-20 MED ORDER — CALCIUM GLUCONATE-NACL 1-0.675 GM/50ML-% IV SOLN
1.0000 g | Freq: Once | INTRAVENOUS | Status: AC
  Administered 2019-04-20: 1000 mg via INTRAVENOUS
  Filled 2019-04-20: qty 50

## 2019-04-20 MED ORDER — ALBUTEROL SULFATE (2.5 MG/3ML) 0.083% IN NEBU: 2.5000 mg | INHALATION_SOLUTION | RESPIRATORY_TRACT | Status: DC | PRN

## 2019-04-20 MED ORDER — CYANOCOBALAMIN 1000 MCG/ML IJ SOLN
1000.0000 ug | Freq: Every day | INTRAMUSCULAR | Status: DC
  Administered 2019-04-20 – 2019-04-24 (×5): 1000 ug via SUBCUTANEOUS
  Filled 2019-04-20 (×5): qty 1

## 2019-04-20 MED ORDER — POTASSIUM CHLORIDE CRYS ER 20 MEQ PO TBCR: 40.0000 meq | EXTENDED_RELEASE_TABLET | Freq: Once | ORAL | Status: DC

## 2019-04-20 MED ORDER — ONDANSETRON HCL 4 MG/2ML IJ SOLN: 4.0000 mg | Freq: Four times a day (QID) | INTRAMUSCULAR | Status: DC | PRN

## 2019-04-20 MED ORDER — ACETAMINOPHEN 650 MG RE SUPP: 650.0000 mg | Freq: Four times a day (QID) | RECTAL | Status: DC | PRN

## 2019-04-20 MED ORDER — SODIUM CHLORIDE 0.9 % IV SOLN
80.0000 mg | Freq: Once | INTRAVENOUS | Status: AC
  Administered 2019-04-20: 80 mg via INTRAVENOUS
  Filled 2019-04-20: qty 80

## 2019-04-20 MED ORDER — ACETAMINOPHEN 325 MG PO TABS: 650.0000 mg | ORAL_TABLET | Freq: Four times a day (QID) | ORAL | Status: DC | PRN

## 2019-04-20 MED ORDER — PANTOPRAZOLE SODIUM 40 MG IV SOLR
40.0000 mg | Freq: Two times a day (BID) | INTRAVENOUS | Status: DC
  Administered 2019-04-20 – 2019-04-21 (×2): 40 mg via INTRAVENOUS
  Filled 2019-04-20 (×2): qty 40

## 2019-04-20 NOTE — Progress Notes (Signed)
Patient potassium level 3.4 and calcium level 6.7 this morning.Text paged Chaney Malling NP awaiting a response.

## 2019-04-20 NOTE — Progress Notes (Addendum)
0700: Report from night nurse Bosie Helper. PRBC infusing at 174m/hr. No signs of distress.  0930: PRBC completed.   1601: Bladder scan performed. 547ml shown. Patient states he wants to try to urinate before being straight cath.

## 2019-04-20 NOTE — ED Notes (Signed)
ED TO INPATIENT HANDOFF REPORT  ED Nurse Name and Phone #: Nicki Reaper Gasquet Name/Age/Gender John [name] 73 y.o. male Room/Bed: 015C/015C  Code Status   Code Status: Full Code  Home/SNF/Other Home Patient oriented to: self, place, time and situation Is this baseline? Yes   Triage Complete: Triage complete  Chief Complaint gi bleed; hypotension   Triage Note Pt from home with a witnessed fall by family. Per EMS family stated pt was unresponsive for a short time, but was alert upon EMS arrival. EMS reports pt had tarry stools. Pt denies pain. Pt has hx of diabetes and hypertension. Pt reports n/v today with diarrhea.   Allergies Allergies  Allergen Reactions  . Levaquin [Levofloxacin In D5w]     Pt states he is not allergic to medication, last took it about 8 years ago  . Lisinopril Swelling    Level of Care/Admitting Diagnosis ED Disposition    ED Disposition Condition [facility]: [place] [[id number]]  Level of Care: Progressive [102]  I expect the patient will be discharged within 24 hours: No (not a candidate for 5C-Observation unit)  Covid Evaluation: N/A  Diagnosis: Upper GI bleed [370488]  Admitting Physician: Vianne Bulls [[id number]]  Attending Physician: Vianne Bulls [[id number]]  PT Class (Do Not Modify): Observation [104]  PT Acc Code (Do Not Modify): Observation [10022]       B Medical/Surgery History Past Medical History:  Diagnosis Date  . CHF (congestive heart failure) ([place])   . COPD (chronic obstructive pulmonary disease) ([place])   . Diabetes mellitus without complication ([place])   . Gout   . Hyperlipemia   . Hypertension   . 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 MI (myocardial infarction) ([place])   . Pulmonary hypertension ([place])    Past Surgical History:  Procedure Laterality Date  . APPENDECTOMY    . LUNG BIOPSY       A IV Location/Drains/Wounds Patient  Lines/Drains/Airways Status   Active Line/Drains/Airways    Name:   Placement date:   Placement time:   Site:   Days:   Peripheral IV 04/20/19 Left Hand   04/20/19    0122    Hand   less than 1   Peripheral IV 04/20/19 Right Antecubital   04/20/19    0232    Antecubital   less than 1   External Urinary Catheter   04/08/19    0313    -   12          Intake/Output Last 24 hours  Intake/Output Summary (Last 24 hours) at 04/20/2019 0345 Last data filed at 04/20/2019 0304 Gross per 24 hour  Intake 600 ml  Output -  Net 600 ml    Labs/Imaging Results for orders placed or performed during the hospital encounter of 04/20/19 (from the past 48 hour(s))  Type and screen [place]     Status: None   Collection Time: 04/20/19  1:44 AM  Result Value Ref Range   ABO/RH(D) O POS    Antibody Screen NEG    Sample Expiration      04/23/2019 Performed at [facility], [place] [address] [place], [place] [address]   Comprehensive metabolic panel     Status: Abnormal   Collection Time: 04/20/19  1:47 AM  Result Value Ref Range   Sodium 136 135 - 145 mmol/L  Potassium 4.5 3.5 - 5.1 mmol/L   Chloride 96 (L) 98 - 111 mmol/L   CO2 22 22 - 32 mmol/L   Glucose, Bld 217 (H) 70 - 99 mg/dL   BUN 39 (H) 8 - 23 mg/dL   Creatinine, Ser 1.97 (H) 0.61 - 1.24 mg/dL   Calcium 8.9 8.9 - 10.3 mg/dL   Total Protein 6.8 6.5 - 8.1 g/dL   Albumin 2.8 (L) 3.5 - 5.0 g/dL   AST 19 15 - 41 U/L   ALT 12 0 - 44 U/L   Alkaline Phosphatase 73 38 - 126 U/L   Total Bilirubin 1.0 0.3 - 1.2 mg/dL   GFR calc non Af Amer 33 (L) >60 mL/min   GFR calc Af Amer 38 (L) >60 mL/min   Anion gap 18 (H) 5 - 15    Comment: Performed at [facility] [address], [place], [place] [address]  CBC WITH DIFFERENTIAL     Status: Abnormal   Collection Time: 04/20/19  1:47 AM  Result Value Ref Range   WBC 13.9 (H) 4.0 - 10.5 K/uL   RBC 2.61 (L) 4.22 - 5.81 MIL/uL   Hemoglobin 7.9 (L) 13.0 - 17.0 g/dL    HCT 26.2 (L) 39.0 - 52.0 %   MCV 100.4 (H) 80.0 - 100.0 fL   MCH 30.3 26.0 - 34.0 pg   MCHC 30.2 30.0 - 36.0 g/dL   RDW 17.5 (H) 11.5 - 15.5 %   Platelets 557 (H) 150 - 400 K/uL   nRBC 0.3 (H) 0.0 - 0.2 %   Neutrophils Relative % 83 %   Neutro Abs 11.6 (H) 1.7 - 7.7 K/uL   Lymphocytes Relative 6 %   Lymphs Abs 0.9 0.7 - 4.0 K/uL   Monocytes Relative 9 %   Monocytes Absolute 1.2 (H) 0.1 - 1.0 K/uL   Eosinophils Relative 1 %   Eosinophils Absolute 0.1 0.0 - 0.5 K/uL   Basophils Relative 0 %   Basophils Absolute 0.0 0.0 - 0.1 K/uL   Immature Granulocytes 1 %   Abs Immature Granulocytes 0.12 (H) 0.00 - 0.07 K/uL    Comment: Performed at [facility], [address] [place], [place] [address]  Protime-INR     Status: Abnormal   Collection Time: 04/20/19  1:47 AM  Result Value Ref Range   Prothrombin Time 16.9 (H) 11.4 - 15.2 seconds   INR 1.4 (H) 0.8 - 1.2    Comment: (NOTE) INR goal varies based on device and disease states. Performed at [facility], [place] [address], [place], [place] [address]   APTT     Status: None   Collection Time: 04/20/19  1:47 AM  Result Value Ref Range   aPTT 32 24 - 36 seconds    Comment: Performed at [facility] [address], [place], [place] [address]  Troponin I - ONCE - STAT     Status: None   Collection Time: 04/20/19  1:47 AM  Result Value Ref Range   Troponin I <0.03 <0.03 ng/mL    Comment: Performed at [facility] [address] [place], [place] [address]  POC occult blood, ED     Status: Abnormal   Collection Time: 04/20/19  2:18 AM  Result Value Ref Range   Fecal Occult Bld POSITIVE (A) NEGATIVE   Dg Chest 1 View  Result Date: 04/20/2019 CLINICAL DATA:  Fall EXAM: CHEST  1 VIEW COMPARISON:  04/08/2019  FINDINGS: Right lower lobe mass again noted, unchanged since prior study measuring approximately 5.3 cm. No confluent opacity on the left. Heart is normal size. No effusions or acute bony abnormality.  IMPRESSION: Stable right lower lobe mass. Cannot exclude neoplasm. This could be further evaluated with chest CT. Electronically Signed   By: Rolm Baptise M.D.   On: 04/20/2019 02:17   Ct Head Wo Contrast  Result Date: 04/20/2019 CLINICAL DATA:  Fall with headache EXAM: CT HEAD WITHOUT CONTRAST CT CERVICAL SPINE WITHOUT CONTRAST TECHNIQUE: Multidetector CT imaging of the head and cervical spine was performed following the standard protocol without intravenous contrast. Multiplanar CT image reconstructions of the cervical spine were also generated. COMPARISON:  04/08/2019 head CT FINDINGS: CT HEAD FINDINGS Brain: There is no mass, hemorrhage or extra-axial collection. There is generalized atrophy without lobar predilection. There is hypoattenuation of the periventricular white matter, most commonly indicating chronic ischemic microangiopathy. Vascular: No abnormal hyperdensity of the major intracranial arteries or dural venous sinuses. No intracranial atherosclerosis. Skull: The visualized skull base, calvarium and extracranial soft tissues are normal. Sinuses/Orbits: No fluid levels or advanced mucosal thickening of the visualized paranasal sinuses. No mastoid or middle ear effusion. The orbits are normal. CT CERVICAL SPINE FINDINGS Alignment: No static subluxation. Facets are aligned. Occipital condyles are normally positioned. Skull base and vertebrae: No acute fracture. Soft tissues and spinal canal: No prevertebral fluid or swelling. No visible canal hematoma. Disc levels: No advanced spinal canal or neural foraminal stenosis. Upper chest: No pneumothorax, pulmonary nodule or pleural effusion. Other: Bilateral carotid bifurcation atherosclerosis. IMPRESSION: 1. Generalized atrophy and findings of chronic ischemic microangiopathy without acute intracranial abnormality. 2. No acute fracture or static subluxation of the cervical spine. Electronically Signed   By: Ulyses Jarred M.D.   On: 04/20/2019 03:10   Ct  Cervical Spine Wo Contrast  Result Date: 04/20/2019 CLINICAL DATA:  Fall with headache EXAM: CT HEAD WITHOUT CONTRAST CT CERVICAL SPINE WITHOUT CONTRAST TECHNIQUE: Multidetector CT imaging of the head and cervical spine was performed following the standard protocol without intravenous contrast. Multiplanar CT image reconstructions of the cervical spine were also generated. COMPARISON:  04/08/2019 head CT FINDINGS: CT HEAD FINDINGS Brain: There is no mass, hemorrhage or extra-axial collection. There is generalized atrophy without lobar predilection. There is hypoattenuation of the periventricular white matter, most commonly indicating chronic ischemic microangiopathy. Vascular: No abnormal hyperdensity of the major intracranial arteries or dural venous sinuses. No intracranial atherosclerosis. Skull: The visualized skull base, calvarium and extracranial soft tissues are normal. Sinuses/Orbits: No fluid levels or advanced mucosal thickening of the visualized paranasal sinuses. No mastoid or middle ear effusion. The orbits are normal. CT CERVICAL SPINE FINDINGS Alignment: No static subluxation. Facets are aligned. Occipital condyles are normally positioned. Skull base and vertebrae: No acute fracture. Soft tissues and spinal canal: No prevertebral fluid or swelling. No visible canal hematoma. Disc levels: No advanced spinal canal or neural foraminal stenosis. Upper chest: No pneumothorax, pulmonary nodule or pleural effusion. Other: Bilateral carotid bifurcation atherosclerosis. IMPRESSION: 1. Generalized atrophy and findings of chronic ischemic microangiopathy without acute intracranial abnormality. 2. No acute fracture or static subluxation of the cervical spine. Electronically Signed   By: Ulyses Jarred M.D.   On: 04/20/2019 03:10    Pending Labs Unresulted Labs (From admission, onward)    Start     Ordered   04/20/19 0500  Comprehensive metabolic panel  Tomorrow morning,   R     04/20/19 0332   04/20/19  0500  Hemoglobin  Now then every 8 hours,   R     04/20/19 0332   04/20/19 0500  Hematocrit  Now then every 8 hours,   R     04/20/19 0332          Vitals/Pain Today's Vitals   04/20/19 0130 04/20/19 0215 04/20/19 0304 04/20/19 0327  BP: 100/60 (!) 104/56 107/61   Pulse: 93 86 83   Resp: (!) 22 17 (!) 25   Temp:      SpO2: 97% 96% 100%   Weight:      Height:      PainSc:    0-No pain    Isolation Precautions No active isolations  Medications Medications  pantoprazole (PROTONIX) 80 mg in sodium chloride 0.9 % 250 mL (0.32 mg/mL) infusion (8 mg/hr Intravenous New Bag/Given 04/20/19 0303)  albuterol (VENTOLIN HFA) 108 (90 Base) MCG/ACT inhaler 2 puff (has no administration in time range)  mometasone-formoterol (DULERA) 200-5 MCG/ACT inhaler 2 puff (has no administration in time range)  sodium chloride flush (NS) 0.9 % injection 3 mL (has no administration in time range)  insulin aspart (novoLOG) injection 0-9 Units (has no administration in time range)  0.9 %  sodium chloride infusion (has no administration in time range)  acetaminophen (TYLENOL) tablet 650 mg (has no administration in time range)    Or  acetaminophen (TYLENOL) suppository 650 mg (has no administration in time range)  ondansetron (ZOFRAN) tablet 4 mg (has no administration in time range)    Or  ondansetron (ZOFRAN) injection 4 mg (has no administration in time range)  sodium chloride 0.9 % bolus 500 mL (0 mLs Intravenous Stopped 04/20/19 0303)  pantoprazole (PROTONIX) 80 mg in sodium chloride 0.9 % 100 mL IVPB (0 mg Intravenous Stopped 04/20/19 0304)    Mobility walks High fall risk   Focused Assessments    R Recommendations: See Admitting Provider Note  Report given to:   Additional Notes:

## 2019-04-20 NOTE — Plan of Care (Signed)
No variances this shift

## 2019-04-20 NOTE — H&P (Signed)
History and Physical    John [name] WSF:[id number] DOB: [birth year] DOA: 04/20/2019  PCP: Patient, No Pcp Per   Patient coming from: Home   Chief Complaint: Melena, syncope   HPI: John [name] is a 73 y.o. male with medical history significant for hypertension, coronary artery disease, chronic diastolic CHF, COPD, chronic hypoxic respiratory failure, and non-small cell lung cancer status post radiotherapy, now presenting to the emergency department after a syncopal episode upon standing.  Reports that he had just stood up when he became acutely lightheaded and suffered a transient loss of consciousness.  He is not sure if he hit his head or not, but denies any headache, change in vision or hearing, or focal numbness or weakness.  He reports some mild abdominal discomfort but denies pain.  Patient reports recent melena, was admitted to the hospital earlier this month with symptomatic anemia and received 2 units of packed red blood cells.  He has had a poor appetite since returning home from the hospital, has generalized weakness, no focal numbness or weakness, no fevers, no new cough or change in his chronic dyspnea, and no chest pain or palpitations.  Denies nausea or vomiting and denies any hematochezia.  ED Course: Upon arrival to the ED, patient is found to be afebrile, saturating well on 2 L/min of supplemental oxygen, and with SBP in the 90s.  EKG features a sinus rhythm with early R transition.  Chest x-ray is notable for stable right lower lobe mass.  Noncontrast head CT is negative for acute intracranial abnormality and cervical spine CT is negative for fracture or subluxation.  Chemistry panel features a creatinine 1.97, up from 1.6 range recently.  CBC is notable for a macrocytic anemia with hemoglobin 7.9, up from 7.2 at time of recent hospital discharge.  Patient was given 500 cc normal saline, started on IV Protonix, and type and screen was performed in the ED.  Review of Systems:    All other systems reviewed and apart from HPI, are negative.  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 His smoking use included cigarettes. He has a 90.00 pack-year smoking history. He has never used smokeless tobacco. He reports that he does not drink alcohol or use drugs.  Allergies  Allergen Reactions   Levaquin [Levofloxacin In D5w]     Pt states he is not allergic to medication, last took it about 8 years ago   Lisinopril Swelling    Family History  Problem Relation Age of Onset   Heart disease Mother    Cancer Neg Hx      Prior to Admission medications   Medication Sig Start Date End Date Taking? Authorizing Provider  albuterol (PROAIR HFA) 108 (90 BASE) MCG/ACT inhaler INHALE 2 PUFFS BY MOUTH EVERY 4 HOURS AS NEEDED FOR WHEEZING Patient taking differently: Inhale 2 puffs into the lungs every 4 (four) hours as needed for wheezing.  03/13/15  Tanda Rockers, MD  allopurinol (ZYLOPRIM) 100 MG tablet Take 1 tablet (100 mg total) by mouth daily. 09/26/16   Tanda Rockers, MD  atorvastatin (LIPITOR) 20 MG tablet Take 1 tablet (20 mg total) by mouth daily. 09/26/16   Tanda Rockers, MD  bisoprolol (ZEBETA) 5 MG tablet Take 1 tablet (5 mg total) by mouth daily. 09/26/16   Tanda Rockers, MD  budesonide-formoterol (SYMBICORT) 160-4.5 MCG/ACT inhaler INHALE 2 PUFFS BY MOUTH EVERY 12 HOURS (FIRST THING IN THE MORNING THEN 12 HOURS LATER) Patient taking differently: Inhale 2 puffs into the lungs 2 (two) times  daily as needed (SOB).  03/24/17   Tanda Rockers, MD  cloNIDine (CATAPRES) 0.1 MG tablet Take 1 tablet (0.1 mg total) by mouth 2 (two) times daily. 12/17/16   Tanda Rockers, MD  colchicine 0.6 MG tablet Take 0.6 mg by mouth daily.  08/01/17   [provider]  folic acid (FOLVITE) 1 MG tablet Take 1 tablet (1 mg total) by mouth daily. 04/09/19 04/08/20  Donne Hazel, MD  furosemide (LASIX) 20 MG tablet Take 1 tablet (20 mg total) by mouth daily. 12/17/16   Tanda Rockers, MD  metFORMIN (GLUCOPHAGE) 1000 MG tablet Take 1,000 mg by mouth 2 (two) times daily. 04/07/19   [provider]  vitamin B-12 (CYANOCOBALAMIN) 1000 MCG tablet Take 1 tablet (1,000 mcg total) by mouth daily for 30 days. 04/09/19 05/09/19  Donne Hazel, MD  Vitamin D, Ergocalciferol, (DRISDOL) 1.25 MG (50000 UT) CAPS capsule Take 1 capsule by mouth once a week. 03/10/19   [provider]    Physical Exam: Vitals:   04/20/19 0127 04/20/19 0130 04/20/19 0215 04/20/19 0304  BP:  100/60 (!) 104/56 107/61  Pulse:  93 86 83  Resp:  (!) 22 17 (!) 25  Temp:      SpO2:  97% 96% 100%  Weight: 88.7 kg     Height: 5\' 5"  (1.651 m)       Constitutional: NAD, calm  Eyes: PERTLA, lids and conjunctivae normal ENMT: Mucous membranes are moist. Posterior pharynx clear of any exudate or lesions.   Neck: normal, supple, no masses, no thyromegaly Respiratory: Breath sound diminished bilaterally. No accessory muscle use.  Cardiovascular: S1 & S2 heard, regular rate and rhythm No extremity edema.   Abdomen: Soft, no tenderness. Bowel sounds activity.  Musculoskeletal: no clubbing / cyanosis. No joint deformity upper and lower extremities.    Skin: Poor turgor. Warm, dry, well-perfused. Neurologic: No facial asymmetry. Sensation intact. Moving all extremitiess.  Psychiatric: Alert and oriented to person, place, and situation. Pleasant and cooperative.     Labs on Admission: I have personally reviewed following  labs and imaging studies  CBC: Recent Labs  Lab 04/20/19 0147  WBC 13.9*  NEUTROABS 11.6*  HGB 7.9*  HCT 26.2*  MCV 100.4*  PLT 712*   Basic Metabolic Panel: Recent Labs  Lab 04/20/19 0147  NA 136  K 4.5  CL 96*  CO2 22  GLUCOSE 217*  BUN 39*  CREATININE 1.97*  CALCIUM 8.9   GFR: Estimated Creatinine Clearance: 33.7 mL/min (A) (by C-G formula based on SCr of 1.97 mg/dL (H)). Liver Function Tests: Recent Labs  Lab 04/20/19 0147  AST 19  ALT 12  ALKPHOS 73  BILITOT 1.0  PROT 6.8  ALBUMIN 2.8*   No results for input(s): LIPASE, AMYLASE in the last 168 hours. No results for input(s): AMMONIA in the last 168 hours.  Coagulation Profile: Recent Labs  Lab 04/20/19 0147  INR 1.4*   Cardiac Enzymes: Recent Labs  Lab 04/20/19 0147  TROPONINI <0.03   BNP (last 3 results) No results for input(s): PROBNP in the last 8760 hours. HbA1C: No results for input(s): HGBA1C in the last 72 hours. CBG: No results for input(s): GLUCAP in the last 168 hours. Lipid Profile: No results for input(s): CHOL, HDL, LDLCALC, TRIG, CHOLHDL, LDLDIRECT in the last 72 hours. Thyroid Function Tests: No results for input(s): TSH, T4TOTAL, FREET4, T3FREE, THYROIDAB in the last 72 hours. Anemia Panel: No results for input(s): VITAMINB12, FOLATE, FERRITIN, TIBC, IRON, RETICCTPCT in the last 72 hours. Urine analysis:    Component Value Date/Time   COLORURINE YELLOW 04/03/2017 2005   APPEARANCEUR CLEAR 04/03/2017 2005   LABSPEC 1.016 04/03/2017 2005   PHURINE 5.0 04/03/2017 2005   GLUCOSEU NEGATIVE 04/03/2017 2005   HGBUR NEGATIVE 04/03/2017 2005   [place] NEGATIVE 04/03/2017 2005   [place] NEGATIVE 04/03/2017 2005   PROTEINUR NEGATIVE 04/03/2017 2005   UROBILINOGEN 1.0 02/22/2012 0238   NITRITE NEGATIVE 04/03/2017 2005   LEUKOCYTESUR NEGATIVE 04/03/2017 2005   Sepsis Labs: @LABRCNTIP (procalcitonin:4,lacticidven:4) )No results found for this or any previous visit (from the  past 240 hour(s)).   Radiological Exams on Admission: Dg Chest 1 View  Result Date: 04/20/2019 CLINICAL DATA:  Fall EXAM: CHEST  1 VIEW COMPARISON:  04/08/2019 FINDINGS: Right lower lobe mass again noted, unchanged since prior study measuring approximately 5.3 cm. No confluent opacity on the left. Heart is normal size. No effusions or acute bony abnormality. IMPRESSION: Stable right lower lobe mass. Cannot exclude neoplasm. This could be further evaluated with chest CT. Electronically Signed   By: Rolm Baptise M.D.   On: 04/20/2019 02:17   Ct Head Wo Contrast  Result Date: 04/20/2019 CLINICAL DATA:  Fall with headache EXAM: CT HEAD WITHOUT CONTRAST CT CERVICAL SPINE WITHOUT CONTRAST TECHNIQUE: Multidetector CT imaging of the head and cervical spine was performed following the standard protocol without intravenous contrast. Multiplanar CT image reconstructions of the cervical spine were also generated. COMPARISON:  04/08/2019 head CT FINDINGS: CT HEAD FINDINGS Brain: There is no mass, hemorrhage or extra-axial collection. There is generalized atrophy without lobar predilection. There is hypoattenuation of the periventricular white matter, most commonly indicating chronic ischemic microangiopathy. Vascular: No abnormal hyperdensity of the major intracranial arteries or dural venous sinuses. No intracranial atherosclerosis. Skull: The visualized skull base, calvarium and extracranial soft tissues are normal. Sinuses/Orbits: No fluid levels or advanced mucosal thickening of the visualized paranasal sinuses. No mastoid or middle ear effusion. The orbits are normal. CT CERVICAL SPINE FINDINGS Alignment: No static subluxation. Facets are aligned. Occipital condyles are normally positioned. Skull base and vertebrae: No acute fracture. Soft tissues and spinal canal: No prevertebral fluid or swelling. No visible canal hematoma. Disc levels: No advanced spinal canal or neural foraminal stenosis. Upper chest: No  pneumothorax, pulmonary nodule or pleural effusion. Other: Bilateral carotid bifurcation atherosclerosis. IMPRESSION: 1. Generalized atrophy and findings of chronic ischemic microangiopathy without acute intracranial abnormality. 2. No acute fracture or static subluxation of the cervical spine. Electronically Signed   By: Ulyses Jarred M.D.   On: 04/20/2019 03:10   Ct Cervical Spine Wo Contrast  Result Date: 04/20/2019 CLINICAL DATA:  Fall with headache EXAM: CT HEAD WITHOUT CONTRAST CT CERVICAL SPINE WITHOUT CONTRAST TECHNIQUE: Multidetector CT imaging of the head and cervical spine was performed following the standard protocol without intravenous contrast. Multiplanar CT image reconstructions of the  cervical spine were also generated. COMPARISON:  04/08/2019 head CT FINDINGS: CT HEAD FINDINGS Brain: There is no mass, hemorrhage or extra-axial collection. There is generalized atrophy without lobar predilection. There is hypoattenuation of the periventricular white matter, most commonly indicating chronic ischemic microangiopathy. Vascular: No abnormal hyperdensity of the major intracranial arteries or dural venous sinuses. No intracranial atherosclerosis. Skull: The visualized skull base, calvarium and extracranial soft tissues are normal. Sinuses/Orbits: No fluid levels or advanced mucosal thickening of the visualized paranasal sinuses. No mastoid or middle ear effusion. The orbits are normal. CT CERVICAL SPINE FINDINGS Alignment: No static subluxation. Facets are aligned. Occipital condyles are normally positioned. Skull base and vertebrae: No acute fracture. Soft tissues and spinal canal: No prevertebral fluid or swelling. No visible canal hematoma. Disc levels: No advanced spinal canal or neural foraminal stenosis. Upper chest: No pneumothorax, pulmonary nodule or pleural effusion. Other: Bilateral carotid bifurcation atherosclerosis. IMPRESSION: 1. Generalized atrophy and findings of chronic ischemic  microangiopathy without acute intracranial abnormality. 2. No acute fracture or static subluxation of the cervical spine. Electronically Signed   By: Ulyses Jarred M.D.   On: 04/20/2019 03:10    EKG: Independently reviewed. Sinus rhythm, early R-transition.   Assessment/Plan   1. Syncope  - Presents after a syncopal episode upon standing, reports melena, denies chest pain or palpitations, is found to have SBP in 90's with SR on EKG   - Likely secondary to orthostasis in setting of GI bleeding with hypovolemia  - Continue cardiac monitoring, check orthostatic vitals, continue IVF hydration     2. Upper GI bleeding; macrocytic anemia  - Hgb is 7.9 on admission, up from 7.2 at time of hospital discharge on 4/10  - He was found to have macrocytosis during recent admission with low B12 and folate and was prescribed supplements  - He reports melena and FOBT is positive in ED  - Continue IV PPI, bowel rest, and IVF hydration - Follow serial H&H and transfuse as needed   3. CKD III   - SCr is 1.97 on admission, slightly up from recent priors  - Continue IVF hydration while NPO, renally-dose medications    4. Lung mass; hx of NSCLC - Patient has hx of non small cell lung cancer s/p curative radiotherapy to RML nodule and hilar adenopathy, and had new RLL mass noted during recent admission  - He will continue oncology follow-up after discharge    5. COPD; chronic hypoxic respiratory failure  - Patient uses 2 Lpm supplemental O2 at baseline, reports his chronic dyspnea is stable  - Continue ICS/LABA, supplemental O2, and as-needed albuterol    6. Leukocytosis  - WBC is 13,900 on admission without fever or infectious s/s, continuing a downtrend from recent admission   - Culture if febrile    7. Hypertension  - SBP 90's in ED, improved with fluid bolus  - Hold antihypertensives initially   8. CAD  - No anginal complaints   9. Type II DM  - A1c was 5.8% earlier this month  -  Managed with metformin at home, held on admission   - Check CBG's and use a low-intensity SSI with Novolog while in hospital    10. Chronic diastolic CHF  - Appears hypovolemic on admission  - Continue IVF hydration and transfuse RBC as needed     PPE: Mask and face shield worn during encounter  DVT prophylaxis: SCD's  Code Status: Full  Family Communication: Discussed with patient  Consults called: None  Admission status: Observation     Vianne Bulls, MD [facility] Pager [phone]  If 7PM-7AM, please contact night-coverage [email or web address] Password [facility]  04/20/2019, 3:33 AM

## 2019-04-20 NOTE — Consult Note (Addendum)
[facility] Consult: 12:10 PM 04/20/2019  LOS: 0 days    Referring Provider: Dr Grandville Silos  Primary Care Physician: Gets his primary care at a medical clinic on [address]. Primary Gastroenterologist:  Dr. Silverio Decamp.  Just seen in patient consult for the first time on 04/08/2019.    Reason for Consultation: Anemia, reported melena.   HPI: John [name] is a 73 y.o. male.  PMH sq cell RLL lung CA 2016, treated with XRT. Pulm htn.  COPD.   CHF EF 55 to 60% in 2015.  DM 2 on oral agents. Htn.  Cholelithiasis, possible fatty liver on [place] 03/2017.  ACE I angioedema.  Underwent colon cancer screening with stool DNA testing within the last couple of years. Has never undergone colonoscopy or EGD per his recall.  Evaluated for macrocytic anemia 04/08/2019.  His Hgb was 5.9.  Only comparable was 12.6 in 07/2017.  MCV 108.  FOBT positive.  He received 1 PRBC.  Hgb 7.2 on 4/10. Labs confirmed folate and B12 deficiency but not iron deficiency. Dr. Silverio Decamp deferred work-up given no evidence for overt GI bleeding.  She plan to see him at the office in 6 to 8 weeks and reassess whether he would benefit from EGD and colonoscopy at that point.  As well he was diagnosed with right lower lobe mass and needed to reestablish with oncology, Dr. Earlie Server, appointment is set up for 05/05/2019.  Readmitted overnight.  Brought to the ED because of a fall at home and brief unresponsiveness.  EMS reported tarry stools and ED physician performed DRE revealed melena.  His Hgb initially 7.9, 6.5 on recheck this morning.  Remains macrocytic. + AKI.    This AM BP 90/53 sitting, 62/45 standing; corresponding pulses 87 >> 95.     GI ros + for anorexia, slight weight loss.  No abd pain, no n/v, no dysphagia.  Up until yesterday he had been having brown  stools but saw black stools yesterday. Other ROS includes stable dyspnea.  Had previously been able to walk around the house without dizziness.  No excessive or unusual bleeding or bruising.  No blood in his urine.  No lower extremity swelling. Takes 400 mg ibuprofen daily in the morning to deal with back stiffness/pain.     Past Medical History:  Diagnosis Date  . B12 deficiency   . CHF (congestive heart failure) ([place])   . COPD (chronic obstructive pulmonary disease) ([place])   . Diabetes mellitus without complication ([place])   . Folate deficiency   . Gout   . Hyperlipemia   . Hypertension   . Lung cancer ([place])    squamous cell carcinoma of right lower lobe lung  . MI (myocardial infarction) ([place])   . Pulmonary hypertension ([place])     Past Surgical History:  Procedure Laterality Date  . APPENDECTOMY    . LUNG BIOPSY      Prior to Admission medications   Medication Sig Start Date End Date Taking? Authorizing Provider  albuterol (PROAIR HFA) 108 (90 BASE) MCG/ACT inhaler INHALE 2 PUFFS BY  MOUTH EVERY 4 HOURS AS NEEDED FOR WHEEZING Patient taking differently: Inhale 2 puffs into the lungs every 4 (four) hours as needed for wheezing.  03/13/15   Tanda Rockers, MD  allopurinol (ZYLOPRIM) 100 MG tablet Take 1 tablet (100 mg total) by mouth daily. 09/26/16   Tanda Rockers, MD  atorvastatin (LIPITOR) 20 MG tablet Take 1 tablet (20 mg total) by mouth daily. 09/26/16   Tanda Rockers, MD  bisoprolol (ZEBETA) 5 MG tablet Take 1 tablet (5 mg total) by mouth daily. 09/26/16   Tanda Rockers, MD  budesonide-formoterol (SYMBICORT) 160-4.5 MCG/ACT inhaler INHALE 2 PUFFS BY MOUTH EVERY 12 HOURS (FIRST THING IN THE MORNING THEN 12 HOURS LATER) Patient taking differently: Inhale 2 puffs into the lungs 2 (two) times daily as needed (SOB).  03/24/17   Tanda Rockers, MD  cloNIDine (CATAPRES) 0.1 MG tablet Take 1 tablet (0.1 mg total) by mouth 2 (two) times daily. 12/17/16   Tanda Rockers, MD   colchicine 0.6 MG tablet Take 0.6 mg by mouth daily.  08/01/17   [provider]  folic acid (FOLVITE) 1 MG tablet Take 1 tablet (1 mg total) by mouth daily. 04/09/19 04/08/20  Donne Hazel, MD  furosemide (LASIX) 20 MG tablet Take 1 tablet (20 mg total) by mouth daily. 12/17/16   Tanda Rockers, MD  metFORMIN (GLUCOPHAGE) 1000 MG tablet Take 1,000 mg by mouth 2 (two) times daily. 04/07/19   [provider]  vitamin B-12 (CYANOCOBALAMIN) 1000 MCG tablet Take 1 tablet (1,000 mcg total) by mouth daily for 30 days. 04/09/19 05/09/19  Donne Hazel, MD  Vitamin D, Ergocalciferol, (DRISDOL) 1.25 MG (50000 UT) CAPS capsule Take 1 capsule by mouth once a week. 03/10/19   [provider]    Scheduled Meds: . cyanocobalamin  1,000 mcg Subcutaneous Daily  . folic acid  1 mg Oral Daily  . insulin aspart  0-9 Units Subcutaneous Q4H  . mometasone-formoterol  2 puff Inhalation BID  . pantoprazole (PROTONIX) IV  40 mg Intravenous Q12H  . sodium chloride flush  3 mL Intravenous Q12H   Infusions: . magnesium sulfate 1 - 4 g bolus IVPB     PRN Meds: acetaminophen **OR** acetaminophen, albuterol, ondansetron **OR** ondansetron (ZOFRAN) IV   Allergies as of 04/20/2019 - Review Complete 04/20/2019  Allergen Reaction Noted  . Levaquin [levofloxacin in d5w]  12/06/2012  . Lisinopril Swelling 02/22/2012    Family History  Problem Relation Age of Onset  . Heart disease Mother   . Cancer Neg Hx     Social History   Social History Narrative   Separated - 1 daughter and 1 son   Retired Horticulturist, commercial   Former smoker, no EtOH     REVIEW OF SYSTEMS: See HPI for results of 12 system review.   PHYSICAL EXAM: Vital signs in last 24 hours: Vitals:   04/20/19 0930 04/20/19 1126  BP: (!) 145/66 118/68  Pulse: 83 73  Resp: 19 (!) 21  Temp: 97.9 F (36.6 C) 98.2 F (36.8 C)  SpO2: 98% 97%   Wt Readings from Last 3 Encounters:  04/20/19 88.7 kg  04/09/19 88.7 kg   10/06/17 93.9 kg    General: Obese, pleasant, comfortable, alert elderly BM Head: No facial asymmetry or swelling.  No signs of head trauma. Eyes: No scleral icterus.  No conjunctival pallor.  EOMI. Ears: No HOH Nose: No discharge Mouth: Edentulous.  Full upper denture in place, not  removed for exam.  Mucosa clear, moist.  Tongue midline. Neck: No JVD, no masses, no thyromegaly. Lungs: No labored breathing or cough.  CTA bilaterally. Heart: RRR.  No MRG.  S1, S2 present. Abdomen: Small umbilical hernia.  Abdominal diastases at upper midline.  No HSM, masses, bruits, hernias.  Mild, nonfocal, left-sided tenderness.  Bowel sounds scant but normal quality..   Rectal: Deferred Musc/Skeltl: No joint redness, swelling or gross deformity.  Some arthritic changes in the hands/fingers. Extremities: No CCE. Neurologic: Fully alert and oriented.  Moves all 4 limbs, strength not tested.  No tremor. Skin: Abrasions, rashes, sores. Tattoos: None observed Nodes: Cervical adenopathy. Psych: Cooperative, calm, pleasant.  Clear speech.  Intake/Output from previous day: 04/20 0701 - 04/21 0700 In: 915 [Blood:315; IV Piggyback:600] Out: -  Intake/Output this shift: Total I/O In: 1707.5 [I.V.:351.2; Blood:305.7; IV Piggyback:1050.6] Out: -   LAB RESULTS: Recent Labs    04/20/19 0147 04/20/19 0351  WBC 13.9*  --   HGB 7.9* 6.5*  HCT 26.2* 22.4*  PLT 557*  --    BMET Lab Results  Component Value Date   NA 140 04/20/2019   NA 136 04/20/2019   NA 138 04/09/2019   K 3.4 (L) 04/20/2019   K 4.5 04/20/2019   K 4.0 04/09/2019   CL 111 04/20/2019   CL 96 (L) 04/20/2019   CL 105 04/09/2019   CO2 17 (L) 04/20/2019   CO2 22 04/20/2019   CO2 22 04/09/2019   GLUCOSE 135 (H) 04/20/2019   GLUCOSE 217 (H) 04/20/2019   GLUCOSE 136 (H) 04/09/2019   BUN 34 (H) 04/20/2019   BUN 39 (H) 04/20/2019   BUN 40 (H) 04/09/2019   CREATININE 1.50 (H) 04/20/2019   CREATININE 1.97 (H) 04/20/2019    CREATININE 1.52 (H) 04/09/2019   CALCIUM 6.7 (L) 04/20/2019   CALCIUM 8.9 04/20/2019   CALCIUM 8.7 (L) 04/09/2019   LFT Recent Labs    04/20/19 0147 04/20/19 0351  PROT 6.8 4.8*  ALBUMIN 2.8* 2.0*  AST 19 14*  ALT 12 8  ALKPHOS 73 55  BILITOT 1.0 0.6   PT/INR Lab Results  Component Value Date   INR 1.4 (H) 04/20/2019   INR 1.3 (H) 04/08/2019   INR 1.09 06/05/2015  Lipase     Component Value Date/Time   LIPASE 25 04/03/2017 1927    Drugs of Abuse     Component Value Date/Time   LABOPIA NONE DETECTED 05/09/2010 1210   COCAINSCRNUR NONE DETECTED 05/09/2010 1210   LABBENZ NONE DETECTED 05/09/2010 1210   AMPHETMU NONE DETECTED 05/09/2010 1210   THCU NONE DETECTED 05/09/2010 1210   LABBARB  05/09/2010 1210    NONE DETECTED        DRUG SCREEN FOR MEDICAL PURPOSES ONLY.  IF CONFIRMATION IS NEEDED FOR ANY PURPOSE, NOTIFY LAB WITHIN 5 DAYS.        LOWEST DETECTABLE LIMITS FOR URINE DRUG SCREEN Drug Class       Cutoff (ng/mL) Amphetamine      1000 Barbiturate      200 Benzodiazepine   270 Tricyclics       786 Opiates          300 Cocaine          300 THC              50     RADIOLOGY STUDIES: Dg Chest 1 View  Result Date: 04/20/2019 CLINICAL DATA:  Fall EXAM: CHEST  1 VIEW COMPARISON:  04/08/2019 FINDINGS: Right lower lobe mass again noted, unchanged since prior study measuring approximately 5.3 cm. No confluent opacity on the left. Heart is normal size. No effusions or acute bony abnormality. IMPRESSION: Stable right lower lobe mass. Cannot exclude neoplasm. This could be further evaluated with chest CT. Electronically Signed   By: Rolm Baptise M.D.   On: 04/20/2019 02:17   Ct Head Wo Contrast  Result Date: 04/20/2019 CLINICAL DATA:  Fall with headache EXAM: CT HEAD WITHOUT CONTRAST CT CERVICAL SPINE WITHOUT CONTRAST TECHNIQUE: Multidetector CT imaging of the head and cervical spine was performed following the standard protocol without intravenous contrast.  Multiplanar CT image reconstructions of the cervical spine were also generated. COMPARISON:  04/08/2019 head CT FINDINGS: CT HEAD FINDINGS Brain: There is no mass, hemorrhage or extra-axial collection. There is generalized atrophy without lobar predilection. There is hypoattenuation of the periventricular white matter, most commonly indicating chronic ischemic microangiopathy. Vascular: No abnormal hyperdensity of the major intracranial arteries or dural venous sinuses. No intracranial atherosclerosis. Skull: The visualized skull base, calvarium and extracranial soft tissues are normal. Sinuses/Orbits: No fluid levels or advanced mucosal thickening of the visualized paranasal sinuses. No mastoid or middle ear effusion. The orbits are normal. CT CERVICAL SPINE FINDINGS Alignment: No static subluxation. Facets are aligned. Occipital condyles are normally positioned. Skull base and vertebrae: No acute fracture. Soft tissues and spinal canal: No prevertebral fluid or swelling. No visible canal hematoma. Disc levels: No advanced spinal canal or neural foraminal stenosis. Upper chest: No pneumothorax, pulmonary nodule or pleural effusion. Other: Bilateral carotid bifurcation atherosclerosis. IMPRESSION: 1. Generalized atrophy and findings of chronic ischemic microangiopathy without acute intracranial abnormality. 2. No acute fracture or static subluxation of the cervical spine. Electronically Signed   By: Ulyses Jarred M.D.   On: 04/20/2019 03:10   Ct Cervical Spine Wo Contrast  Result Date: 04/20/2019 CLINICAL DATA:  Fall with headache EXAM: CT HEAD WITHOUT CONTRAST CT CERVICAL SPINE WITHOUT CONTRAST TECHNIQUE: Multidetector CT imaging of the head and cervical spine was performed following the standard protocol without intravenous contrast. Multiplanar CT image reconstructions of the cervical spine were also generated. COMPARISON:  04/08/2019 head CT FINDINGS: CT HEAD FINDINGS Brain: There is no mass, hemorrhage or  extra-axial collection. There is generalized atrophy without lobar predilection. There is hypoattenuation of the periventricular white matter, most commonly indicating chronic ischemic microangiopathy. Vascular: No abnormal hyperdensity of the major intracranial arteries or dural venous sinuses. No intracranial atherosclerosis. Skull: The visualized skull base, calvarium and extracranial soft tissues are normal. Sinuses/Orbits: No fluid levels or advanced mucosal thickening of the visualized paranasal sinuses. No mastoid or middle ear effusion. The orbits are normal. CT CERVICAL SPINE FINDINGS Alignment: No static subluxation. Facets are aligned. Occipital condyles are normally positioned. Skull base and vertebrae: No acute fracture. Soft tissues and spinal canal: No prevertebral fluid or swelling. No visible canal hematoma. Disc levels: No advanced spinal canal or neural foraminal stenosis. Upper chest: No pneumothorax, pulmonary nodule or pleural effusion. Other: Bilateral carotid bifurcation atherosclerosis. IMPRESSION: 1. Generalized atrophy and findings of chronic ischemic microangiopathy without acute intracranial abnormality. 2. No acute fracture or static subluxation of the cervical spine. Electronically Signed   By: Ulyses Jarred M.D.   On: 04/20/2019 03:10     IMPRESSION:   *    Macrocytic anemia.  Folate and b12 deficient.  On oral J24 and folic acid at home for last 2 weeks.   Melenic stool, previously FOBT positive. Work-up for this delayed  after consultation 2 weeks prior due to need to investigate recurrent vs never eradicated lung cancer. Awaiting follow-up Hgb after 1 PRBC  *   Syncope.  Unremarkable head CT.    *   Lung cancer.  Recurrent vs never eradicated.    *   AKI.       PLAN:     *  ?  Pursue EGD and/or colonoscopy.  Given that he has had melena, may be safest to pursue EGD and if cause for GI bleed encountered, colonoscopy can be deferred to a future time. I am  leaving him on clear liquids for now in case Dr. Arelia Longest decides to pursue colonoscopy in addition to EGD tmrw.  *   Going to switch off IV drip Protonix and onto IV twice daily formulation.   Silvano Rusk  04/20/2019, 12:10 PM Phone [phone]      [place] Attending   I have taken an interval history, reviewed the chart and examined the patient. I agree with the Advanced Practitioner's note, impression and recommendations.   Also spoke to daughter w/ him via phone Discovered he takes Gabriel Earing powders fairly often. Given that and ibuprofen ? NSAID ulcer  He also has some early satiety and possible weight loss though not in 10 d  Wt Readings from Last 3 Encounters:  04/20/19 88.7 kg  04/09/19 88.7 kg  10/06/17 93.9 kg    Needs EGD which will do tomorrow The risks and benefits as well as alternatives of endoscopic procedure(s) have been discussed and reviewed. All questions answered. The patient agrees to proceed.  Depending upon what that shows we may need CT scanning here after we get through with GI w/u Looks like he had a new nodule in 2017 RLL but never had a CT after that  Gatha Mayer, MD, [facility] 04/20/2019 12:10 PM Pager [phone]

## 2019-04-20 NOTE — Progress Notes (Signed)
Patient hemoglobin down 6.5 this morning.Patient orthostatic  vital signs as follows lying B/P 93/71 hr 87,sitting B/P 90/53 hr 86,standing B/P 62/45 hr 95.Patient complaining of being dizzy unable to stand for 3 minutes.Text paged Chaney Malling NP.

## 2019-04-20 NOTE — H&P (View-Only) (Signed)
[facility] Consult: 12:10 PM 04/20/2019  LOS: 0 days    Referring Provider: Dr Grandville Silos  Primary Care Physician: Gets his primary care at a medical clinic on [address]. Primary Gastroenterologist:  Dr. Silverio Decamp.  Just seen in patient consult for the first time on 04/08/2019.    Reason for Consultation: Anemia, reported melena.   HPI: John [name] is a 73 y.o. male.  PMH sq cell RLL lung CA 2016, treated with XRT. Pulm htn.  COPD.   CHF EF 55 to 60% in 2015.  DM 2 on oral agents. Htn.  Cholelithiasis, possible fatty liver on [place] 03/2017.  ACE I angioedema.  Underwent colon cancer screening with stool DNA testing within the last couple of years. Has never undergone colonoscopy or EGD per his recall.  Evaluated for macrocytic anemia 04/08/2019.  His Hgb was 5.9.  Only comparable was 12.6 in 07/2017.  MCV 108.  FOBT positive.  He received 1 PRBC.  Hgb 7.2 on 4/10. Labs confirmed folate and B12 deficiency but not iron deficiency. Dr. Silverio Decamp deferred work-up given no evidence for overt GI bleeding.  She plan to see him at the office in 6 to 8 weeks and reassess whether he would benefit from EGD and colonoscopy at that point.  As well he was diagnosed with right lower lobe mass and needed to reestablish with oncology, Dr. Earlie Server, appointment is set up for 05/05/2019.  Readmitted overnight.  Brought to the ED because of a fall at home and brief unresponsiveness.  EMS reported tarry stools and ED physician performed DRE revealed melena.  His Hgb initially 7.9, 6.5 on recheck this morning.  Remains macrocytic. + AKI.    This AM BP 90/53 sitting, 62/45 standing; corresponding pulses 87 >> 95.     GI ros + for anorexia, slight weight loss.  No abd pain, no n/v, no dysphagia.  Up until yesterday he had been having brown  stools but saw black stools yesterday. Other ROS includes stable dyspnea.  Had previously been able to walk around the house without dizziness.  No excessive or unusual bleeding or bruising.  No blood in his urine.  No lower extremity swelling. Takes 400 mg ibuprofen daily in the morning to deal with back stiffness/pain.     Past Medical History:  Diagnosis Date  . B12 deficiency   . CHF (congestive heart failure) ([place])   . COPD (chronic obstructive pulmonary disease) ([place])   . Diabetes mellitus without complication ([place])   . Folate deficiency   . Gout   . Hyperlipemia   . Hypertension   . Lung cancer ([place])    squamous cell carcinoma of right lower lobe lung  . MI (myocardial infarction) ([place])   . Pulmonary hypertension ([place])     Past Surgical History:  Procedure Laterality Date  . APPENDECTOMY    . LUNG BIOPSY      Prior to Admission medications   Medication Sig Start Date End Date Taking? Authorizing Provider  albuterol (PROAIR HFA) 108 (90 BASE) MCG/ACT inhaler INHALE 2 PUFFS BY  MOUTH EVERY 4 HOURS AS NEEDED FOR WHEEZING Patient taking differently: Inhale 2 puffs into the lungs every 4 (four) hours as needed for wheezing.  03/13/15   Tanda Rockers, MD  allopurinol (ZYLOPRIM) 100 MG tablet Take 1 tablet (100 mg total) by mouth daily. 09/26/16   Tanda Rockers, MD  atorvastatin (LIPITOR) 20 MG tablet Take 1 tablet (20 mg total) by mouth daily. 09/26/16   Tanda Rockers, MD  bisoprolol (ZEBETA) 5 MG tablet Take 1 tablet (5 mg total) by mouth daily. 09/26/16   Tanda Rockers, MD  budesonide-formoterol (SYMBICORT) 160-4.5 MCG/ACT inhaler INHALE 2 PUFFS BY MOUTH EVERY 12 HOURS (FIRST THING IN THE MORNING THEN 12 HOURS LATER) Patient taking differently: Inhale 2 puffs into the lungs 2 (two) times daily as needed (SOB).  03/24/17   Tanda Rockers, MD  cloNIDine (CATAPRES) 0.1 MG tablet Take 1 tablet (0.1 mg total) by mouth 2 (two) times daily. 12/17/16   Tanda Rockers, MD   colchicine 0.6 MG tablet Take 0.6 mg by mouth daily.  08/01/17   [provider]  folic acid (FOLVITE) 1 MG tablet Take 1 tablet (1 mg total) by mouth daily. 04/09/19 04/08/20  Donne Hazel, MD  furosemide (LASIX) 20 MG tablet Take 1 tablet (20 mg total) by mouth daily. 12/17/16   Tanda Rockers, MD  metFORMIN (GLUCOPHAGE) 1000 MG tablet Take 1,000 mg by mouth 2 (two) times daily. 04/07/19   [provider]  vitamin B-12 (CYANOCOBALAMIN) 1000 MCG tablet Take 1 tablet (1,000 mcg total) by mouth daily for 30 days. 04/09/19 05/09/19  Donne Hazel, MD  Vitamin D, Ergocalciferol, (DRISDOL) 1.25 MG (50000 UT) CAPS capsule Take 1 capsule by mouth once a week. 03/10/19   [provider]    Scheduled Meds: . cyanocobalamin  1,000 mcg Subcutaneous Daily  . folic acid  1 mg Oral Daily  . insulin aspart  0-9 Units Subcutaneous Q4H  . mometasone-formoterol  2 puff Inhalation BID  . pantoprazole (PROTONIX) IV  40 mg Intravenous Q12H  . sodium chloride flush  3 mL Intravenous Q12H   Infusions: . magnesium sulfate 1 - 4 g bolus IVPB     PRN Meds: acetaminophen **OR** acetaminophen, albuterol, ondansetron **OR** ondansetron (ZOFRAN) IV   Allergies as of 04/20/2019 - Review Complete 04/20/2019  Allergen Reaction Noted  . Levaquin [levofloxacin in d5w]  12/06/2012  . Lisinopril Swelling 02/22/2012    Family History  Problem Relation Age of Onset  . Heart disease Mother   . Cancer Neg Hx     Social History   Social History Narrative   Separated - 1 daughter and 1 son   Retired Horticulturist, commercial   Former smoker, no EtOH     REVIEW OF SYSTEMS: See HPI for results of 12 system review.   PHYSICAL EXAM: Vital signs in last 24 hours: Vitals:   04/20/19 0930 04/20/19 1126  BP: (!) 145/66 118/68  Pulse: 83 73  Resp: 19 (!) 21  Temp: 97.9 F (36.6 C) 98.2 F (36.8 C)  SpO2: 98% 97%   Wt Readings from Last 3 Encounters:  04/20/19 88.7 kg  04/09/19 88.7 kg   10/06/17 93.9 kg    General: Obese, pleasant, comfortable, alert elderly BM Head: No facial asymmetry or swelling.  No signs of head trauma. Eyes: No scleral icterus.  No conjunctival pallor.  EOMI. Ears: No HOH Nose: No discharge Mouth: Edentulous.  Full upper denture in place, not  removed for exam.  Mucosa clear, moist.  Tongue midline. Neck: No JVD, no masses, no thyromegaly. Lungs: No labored breathing or cough.  CTA bilaterally. Heart: RRR.  No MRG.  S1, S2 present. Abdomen: Small umbilical hernia.  Abdominal diastases at upper midline.  No HSM, masses, bruits, hernias.  Mild, nonfocal, left-sided tenderness.  Bowel sounds scant but normal quality..   Rectal: Deferred Musc/Skeltl: No joint redness, swelling or gross deformity.  Some arthritic changes in the hands/fingers. Extremities: No CCE. Neurologic: Fully alert and oriented.  Moves all 4 limbs, strength not tested.  No tremor. Skin: Abrasions, rashes, sores. Tattoos: None observed Nodes: Cervical adenopathy. Psych: Cooperative, calm, pleasant.  Clear speech.  Intake/Output from previous day: 04/20 0701 - 04/21 0700 In: 915 [Blood:315; IV Piggyback:600] Out: -  Intake/Output this shift: Total I/O In: 1707.5 [I.V.:351.2; Blood:305.7; IV Piggyback:1050.6] Out: -   LAB RESULTS: Recent Labs    04/20/19 0147 04/20/19 0351  WBC 13.9*  --   HGB 7.9* 6.5*  HCT 26.2* 22.4*  PLT 557*  --    BMET Lab Results  Component Value Date   NA 140 04/20/2019   NA 136 04/20/2019   NA 138 04/09/2019   K 3.4 (L) 04/20/2019   K 4.5 04/20/2019   K 4.0 04/09/2019   CL 111 04/20/2019   CL 96 (L) 04/20/2019   CL 105 04/09/2019   CO2 17 (L) 04/20/2019   CO2 22 04/20/2019   CO2 22 04/09/2019   GLUCOSE 135 (H) 04/20/2019   GLUCOSE 217 (H) 04/20/2019   GLUCOSE 136 (H) 04/09/2019   BUN 34 (H) 04/20/2019   BUN 39 (H) 04/20/2019   BUN 40 (H) 04/09/2019   CREATININE 1.50 (H) 04/20/2019   CREATININE 1.97 (H) 04/20/2019    CREATININE 1.52 (H) 04/09/2019   CALCIUM 6.7 (L) 04/20/2019   CALCIUM 8.9 04/20/2019   CALCIUM 8.7 (L) 04/09/2019   LFT Recent Labs    04/20/19 0147 04/20/19 0351  PROT 6.8 4.8*  ALBUMIN 2.8* 2.0*  AST 19 14*  ALT 12 8  ALKPHOS 73 55  BILITOT 1.0 0.6   PT/INR Lab Results  Component Value Date   INR 1.4 (H) 04/20/2019   INR 1.3 (H) 04/08/2019   INR 1.09 06/05/2015  Lipase     Component Value Date/Time   LIPASE 25 04/03/2017 1927    Drugs of Abuse     Component Value Date/Time   LABOPIA NONE DETECTED 05/09/2010 1210   COCAINSCRNUR NONE DETECTED 05/09/2010 1210   LABBENZ NONE DETECTED 05/09/2010 1210   AMPHETMU NONE DETECTED 05/09/2010 1210   THCU NONE DETECTED 05/09/2010 1210   LABBARB  05/09/2010 1210    NONE DETECTED        DRUG SCREEN FOR MEDICAL PURPOSES ONLY.  IF CONFIRMATION IS NEEDED FOR ANY PURPOSE, NOTIFY LAB WITHIN 5 DAYS.        LOWEST DETECTABLE LIMITS FOR URINE DRUG SCREEN Drug Class       Cutoff (ng/mL) Amphetamine      1000 Barbiturate      200 Benzodiazepine   376 Tricyclics       283 Opiates          300 Cocaine          300 THC              50     RADIOLOGY STUDIES: Dg Chest 1 View  Result Date: 04/20/2019 CLINICAL DATA:  Fall EXAM: CHEST  1 VIEW COMPARISON:  04/08/2019 FINDINGS: Right lower lobe mass again noted, unchanged since prior study measuring approximately 5.3 cm. No confluent opacity on the left. Heart is normal size. No effusions or acute bony abnormality. IMPRESSION: Stable right lower lobe mass. Cannot exclude neoplasm. This could be further evaluated with chest CT. Electronically Signed   By: Rolm Baptise M.D.   On: 04/20/2019 02:17   Ct Head Wo Contrast  Result Date: 04/20/2019 CLINICAL DATA:  Fall with headache EXAM: CT HEAD WITHOUT CONTRAST CT CERVICAL SPINE WITHOUT CONTRAST TECHNIQUE: Multidetector CT imaging of the head and cervical spine was performed following the standard protocol without intravenous contrast.  Multiplanar CT image reconstructions of the cervical spine were also generated. COMPARISON:  04/08/2019 head CT FINDINGS: CT HEAD FINDINGS Brain: There is no mass, hemorrhage or extra-axial collection. There is generalized atrophy without lobar predilection. There is hypoattenuation of the periventricular white matter, most commonly indicating chronic ischemic microangiopathy. Vascular: No abnormal hyperdensity of the major intracranial arteries or dural venous sinuses. No intracranial atherosclerosis. Skull: The visualized skull base, calvarium and extracranial soft tissues are normal. Sinuses/Orbits: No fluid levels or advanced mucosal thickening of the visualized paranasal sinuses. No mastoid or middle ear effusion. The orbits are normal. CT CERVICAL SPINE FINDINGS Alignment: No static subluxation. Facets are aligned. Occipital condyles are normally positioned. Skull base and vertebrae: No acute fracture. Soft tissues and spinal canal: No prevertebral fluid or swelling. No visible canal hematoma. Disc levels: No advanced spinal canal or neural foraminal stenosis. Upper chest: No pneumothorax, pulmonary nodule or pleural effusion. Other: Bilateral carotid bifurcation atherosclerosis. IMPRESSION: 1. Generalized atrophy and findings of chronic ischemic microangiopathy without acute intracranial abnormality. 2. No acute fracture or static subluxation of the cervical spine. Electronically Signed   By: Ulyses Jarred M.D.   On: 04/20/2019 03:10   Ct Cervical Spine Wo Contrast  Result Date: 04/20/2019 CLINICAL DATA:  Fall with headache EXAM: CT HEAD WITHOUT CONTRAST CT CERVICAL SPINE WITHOUT CONTRAST TECHNIQUE: Multidetector CT imaging of the head and cervical spine was performed following the standard protocol without intravenous contrast. Multiplanar CT image reconstructions of the cervical spine were also generated. COMPARISON:  04/08/2019 head CT FINDINGS: CT HEAD FINDINGS Brain: There is no mass, hemorrhage or  extra-axial collection. There is generalized atrophy without lobar predilection. There is hypoattenuation of the periventricular white matter, most commonly indicating chronic ischemic microangiopathy. Vascular: No abnormal hyperdensity of the major intracranial arteries or dural venous sinuses. No intracranial atherosclerosis. Skull: The visualized skull base, calvarium and extracranial soft tissues are normal. Sinuses/Orbits: No fluid levels or advanced mucosal thickening of the visualized paranasal sinuses. No mastoid or middle ear effusion. The orbits are normal. CT CERVICAL SPINE FINDINGS Alignment: No static subluxation. Facets are aligned. Occipital condyles are normally positioned. Skull base and vertebrae: No acute fracture. Soft tissues and spinal canal: No prevertebral fluid or swelling. No visible canal hematoma. Disc levels: No advanced spinal canal or neural foraminal stenosis. Upper chest: No pneumothorax, pulmonary nodule or pleural effusion. Other: Bilateral carotid bifurcation atherosclerosis. IMPRESSION: 1. Generalized atrophy and findings of chronic ischemic microangiopathy without acute intracranial abnormality. 2. No acute fracture or static subluxation of the cervical spine. Electronically Signed   By: Ulyses Jarred M.D.   On: 04/20/2019 03:10     IMPRESSION:   *    Macrocytic anemia.  Folate and b12 deficient.  On oral D17 and folic acid at home for last 2 weeks.   Melenic stool, previously FOBT positive. Work-up for this delayed  after consultation 2 weeks prior due to need to investigate recurrent vs never eradicated lung cancer. Awaiting follow-up Hgb after 1 PRBC  *   Syncope.  Unremarkable head CT.    *   Lung cancer.  Recurrent vs never eradicated.    *   AKI.       PLAN:     *  ?  Pursue EGD and/or colonoscopy.  Given that he has had melena, may be safest to pursue EGD and if cause for GI bleed encountered, colonoscopy can be deferred to a future time. I am  leaving him on clear liquids for now in case Dr. Arelia Longest decides to pursue colonoscopy in addition to EGD tmrw.  *   Going to switch off IV drip Protonix and onto IV twice daily formulation.   Silvano Rusk  04/20/2019, 12:10 PM Phone [phone]      [place] Attending   I have taken an interval history, reviewed the chart and examined the patient. I agree with the Advanced Practitioner's note, impression and recommendations.   Also spoke to daughter w/ him via phone Discovered he takes Gabriel Earing powders fairly often. Given that and ibuprofen ? NSAID ulcer  He also has some early satiety and possible weight loss though not in 10 d  Wt Readings from Last 3 Encounters:  04/20/19 88.7 kg  04/09/19 88.7 kg  10/06/17 93.9 kg    Needs EGD which will do tomorrow The risks and benefits as well as alternatives of endoscopic procedure(s) have been discussed and reviewed. All questions answered. The patient agrees to proceed.  Depending upon what that shows we may need CT scanning here after we get through with GI w/u Looks like he had a new nodule in 2017 RLL but never had a CT after that  Gatha Mayer, MD, [facility] 04/20/2019 12:10 PM Pager [phone]

## 2019-04-20 NOTE — ED Notes (Signed)
Attempted report and RN asked if she could call back due to using bathroom.

## 2019-04-20 NOTE — ED Provider Notes (Signed)
TIME SEEN: 1:39 AM  CHIEF COMPLAINT: Syncope, melena  HPI: Patient is a 73 year old male with history of hypertension, diabetes, hyperlipidemia, COPD, CHF who presents to the emergency department with melena, syncope.  Patient is unable to tell me how many days he has had melena.  Was admitted to the hospital in April 9 for the same.  He had to be transfused packed red blood cells at that time.  Was seen by GI but endoscopy not performed as no signs of active bleeding at that time and given we are and a coronavirus pandemic.  Patient states tonight that he passed out.  He does state he felt short of breath but no chest pain.  Unknown if patient hit his head.  Not on blood thinners.  No hematochezia, hematemesis.  No abdominal pain.  Denies fevers, chills, cough.  ROS: See HPI Constitutional: no fever  Eyes: no drainage  ENT: no runny nose   Cardiovascular:  no chest pain  Resp:  SOB  GI: no vomiting GU: no dysuria Integumentary: no rash  Allergy: no hives  Musculoskeletal: no leg swelling  Neurological: no slurred speech ROS otherwise negative  PAST MEDICAL HISTORY/PAST SURGICAL HISTORY:  Past Medical History:  Diagnosis Date  . CHF (congestive heart failure) ([place])   . COPD (chronic obstructive pulmonary disease) ([place])   . Diabetes mellitus without complication ([place])   . Gout   . Hyperlipemia   . Hypertension   . 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 MI (myocardial infarction) ([place])   . Pulmonary hypertension ([facility])     MEDICATIONS:  Prior to Admission medications   Medication Sig Start Date End Date Taking? Authorizing Provider  albuterol (PROAIR HFA) 108 (90 BASE) MCG/ACT inhaler INHALE 2 PUFFS BY MOUTH EVERY 4 HOURS AS NEEDED FOR WHEEZING Patient taking differently: Inhale 2 puffs into the lungs every 4 (four) hours as needed for wheezing.  03/13/15   Tanda Rockers, MD  allopurinol (ZYLOPRIM) 100 MG tablet Take 1 tablet (100 mg total) by  mouth daily. 09/26/16   Tanda Rockers, MD  atorvastatin (LIPITOR) 20 MG tablet Take 1 tablet (20 mg total) by mouth daily. 09/26/16   Tanda Rockers, MD  bisoprolol (ZEBETA) 5 MG tablet Take 1 tablet (5 mg total) by mouth daily. 09/26/16   Tanda Rockers, MD  budesonide-formoterol (SYMBICORT) 160-4.5 MCG/ACT inhaler INHALE 2 PUFFS BY MOUTH EVERY 12 HOURS (FIRST THING IN THE MORNING THEN 12 HOURS LATER) Patient taking differently: Inhale 2 puffs into the lungs 2 (two) times daily as needed (SOB).  03/24/17   Tanda Rockers, MD  cloNIDine (CATAPRES) 0.1 MG tablet Take 1 tablet (0.1 mg total) by mouth 2 (two) times daily. 12/17/16   Tanda Rockers, MD  colchicine 0.6 MG tablet Take 0.6 mg by mouth daily.  08/01/17   [provider]  folic acid (FOLVITE) 1 MG tablet Take 1 tablet (1 mg total) by mouth daily. 04/09/19 04/08/20  Donne Hazel, MD  furosemide (LASIX) 20 MG tablet Take 1 tablet (20 mg total) by mouth daily. 12/17/16   Tanda Rockers, MD  metFORMIN (GLUCOPHAGE) 1000 MG tablet Take 1,000 mg by mouth 2 (two) times daily. 04/07/19   [provider]  vitamin B-12 (CYANOCOBALAMIN) 1000 MCG tablet Take 1 tablet (1,000 mcg total) by mouth daily for 30 days. 04/09/19 05/09/19  Donne Hazel, MD  Vitamin D, Ergocalciferol, (DRISDOL) 1.25 MG (50000  UT) CAPS capsule Take 1 capsule by mouth once a week. 03/10/19   [provider]    ALLERGIES:  Allergies  Allergen Reactions  . Levaquin [Levofloxacin In D5w]     Pt states he is not allergic to medication, last took it about 8 years ago  . Lisinopril Swelling    SOCIAL HISTORY:  Social History   Tobacco Use  . Smoking status: Former Smoker    Packs/day: 1.50    Years: 60.00    Pack years: 90.00    Types: Cigarettes    Last attempt to quit: 09/29/2012    Years since quitting: 6.5  . Smokeless tobacco: Never Used  Substance Use Topics  . Alcohol use: No    Alcohol/week: 0.0 standard drinks    FAMILY  HISTORY: Family History  Problem Relation Age of Onset  . Heart disease Mother   . Cancer Neg Hx     EXAM: BP (!) 97/59   Pulse 94   Temp 97.7 F (36.5 C)   Resp (!) 23   Ht 5\' 5"  (1.651 m)   Wt 88.7 kg   SpO2 98%   BMI 32.54 kg/m  CONSTITUTIONAL: Alert and oriented and responds appropriately to questions.  Elderly, in no distress HEAD: Normocephalic, atraumatic EYES: Conjunctivae clear, pupils appear equal, EOMI ENT: normal nose; moist mucous membranes NECK: Supple, no meningismus, no nuchal rigidity, no LAD  CARD: RRR; S1 and S2 appreciated; no murmurs, no clicks, no rubs, no gallops RESP: Normal chest excursion without splinting or tachypnea; breath sounds clear and equal bilaterally; no wheezes, no rhonchi, no rales, no hypoxia or respiratory distress, speaking full sentences ABD/GI: Normal bowel sounds; non-distended; soft, non-tender, no rebound, no guarding, no peritoneal signs, no hepatosplenomegaly BACK:  The back appears normal and is non-tender to palpation, there is no CVA tenderness RECTAL:  Normal rectal tone, no gross blood, patient has melena on exam, guaiac positive, no hemorrhoids appreciated, nontender rectal exam, no fecal impaction EXT: Normal ROM in all joints; non-tender to palpation; no edema; normal capillary refill; no cyanosis, no calf tenderness or swelling    SKIN: Normal color for age and race; warm; no rash NEURO: Moves all extremities equally, no facial asymmetry, normal speech, normal sensation diffusely PSYCH: The patient's mood and manner are appropriate. Grooming and personal hygiene are appropriate.  MEDICAL DECISION MAKING: Patient here with GI bleed.  Hypotensive with EMS but this is improving with IV hydration.  Was recently admitted to the hospital for the same and required blood transfusion.  Reportedly had a syncopal event today at home.  No sign of trauma on exam.  Unclear if patient hit his head.  Will obtain CT of the head and cervical  spine.  Will obtain labs, chest x-ray, EKG.  Will place second peripheral IV and give IV fluids, Protonix.  Patient may need a blood transfusion.  Patient will need admission.  ED PROGRESS: Patient's blood pressure improving.  Hemoglobin is 7.9 which is up from his discharge blood work of 7.2.  CT head and cervical spine show no acute abnormality.  Troponin negative.  Chest x-ray shows stable lung mass.  Will discuss with medicine for admission for GI bleed.   3:24 AM Discussed patient's case with hospitalist, Dr. Myna Hidalgo.  I have recommended admission and patient (and family if present) agree with this plan. Admitting physician will place admission orders.   I reviewed all nursing notes, vitals, pertinent previous records, EKGs, lab and urine results, imaging (as  available).      EKG Interpretation  Date/Time:  Tuesday April 20 2019 02:05:01 EDT Ventricular Rate:  88 PR Interval:    QRS Duration: 94 QT Interval:  362 QTC Calculation: 438 R Axis:   30 Text Interpretation:  Sinus rhythm Abnormal R-wave progression, early transition Minimal ST elevation, anterior leads No significant change since last tracing Confirmed by Pryor Curia [phone]) on 04/20/2019 2:12:11 AM        CRITICAL CARE Performed by: Cyril Mourning Arisa Congleton   Total critical care time: 65 minutes  Critical care time was exclusive of separately billable procedures and treating other patients.  Critical care was necessary to treat or prevent imminent or life-threatening deterioration.  Critical care was time spent personally by me on the following activities: development of treatment plan with patient and/or surrogate as well as nursing, discussions with consultants, evaluation of patient's response to treatment, examination of patient, obtaining history from patient or surrogate, ordering and performing treatments and interventions, ordering and review of laboratory studies, ordering and review of radiographic studies, pulse  oximetry and re-evaluation of patient's condition.    Britain Anagnos, Delice Bison, DO 04/20/19 [phone]

## 2019-04-20 NOTE — ED Triage Notes (Signed)
Pt from home with a witnessed fall by family. Per EMS family stated pt was unresponsive for a short time, but was alert upon EMS arrival. EMS reports pt had tarry stools. Pt denies pain. Pt has hx of diabetes and hypertension. Pt reports n/v today with diarrhea.

## 2019-04-20 NOTE — Progress Notes (Signed)
Patient arrived to unit [facility] bed 13 from emergency room and assisted to bed by nursing staff.Oriented patient to nursing unit and call bell system.Patient had fall prior to coming to hospital so patient placed on low bed with floor mats ,yellow bracelet ,yellow socks , and bed alarm on for safety.Educated patient and verbalized understanding.No acute distress noted at present time.

## 2019-04-20 NOTE — Progress Notes (Addendum)
I have seen and assessed patient and agree with Dr. Criss Rosales assessment and plan.  Patient is a pleasant 73-year-old gentleman history of hypertension, coronary artery disease, chronic diastolic failure, COPD, chronic hypoxic respiratory failure, history of non-small cell lung cancer status post radiotherapy, B52 and folic acid deficiency, presented to the ED after a syncopal episode upon standing when he became lightheaded and suffered transient loss of consciousness.  Patient noted to be having some melanotic stools.  Patient admitted to the hospital earlier on this month with symptomatic anemia and similar symptoms and received 2 units of packed red blood cells and discharged home to follow-up with GI for outpatient evaluation for possible endoscopy and colonoscopy.  Patient noted to be hypotensive on admission.  H&H drawn with a hemoglobin of 6.5.  Patient being transfused a unit of packed red blood cells.  Patient does endorse use of Goody's powder.  Will reconsult GI for further evaluation and probable upper endoscopy during his hospitalization.  Patient also noted on chest x-ray to have stable right lower lobe mass cannot exclude neoplasm.  Once upper GI evaluation has been done and patient hemodynamically stable will need to get CT of the chest for further evaluation of right lower lobe mass.  Continue IV fluids.  Patient was to follow-up with Dr. Lorna Few of oncology to establish care and will need to do this in the outpatient setting once he is discharged for further evaluation of right lower lobe mass.   No charge.

## 2019-04-21 ENCOUNTER — Inpatient Hospital Stay (HOSPITAL_COMMUNITY): Payer: Medicare Other | Admitting: Anesthesiology

## 2019-04-21 ENCOUNTER — Encounter (HOSPITAL_COMMUNITY): Payer: Self-pay

## 2019-04-21 ENCOUNTER — Inpatient Hospital Stay (HOSPITAL_COMMUNITY): Payer: Medicare Other

## 2019-04-21 ENCOUNTER — Encounter (HOSPITAL_COMMUNITY)
Admission: EM | Disposition: A | Discharge status: Home / Self Care | Payer: Self-pay | Source: Home / Self Care | Attending: Family Medicine

## 2019-04-21 DIAGNOSIS — D649 Anemia, unspecified: Secondary | ICD-10-CM

## 2019-04-21 DIAGNOSIS — D62 Acute posthemorrhagic anemia: Secondary | ICD-10-CM

## 2019-04-21 DIAGNOSIS — R55 Syncope and collapse: Secondary | ICD-10-CM

## 2019-04-21 DIAGNOSIS — K264 Chronic or unspecified duodenal ulcer with hemorrhage: Secondary | ICD-10-CM | POA: Diagnosis present

## 2019-04-21 DIAGNOSIS — K210 Gastro-esophageal reflux disease with esophagitis: Secondary | ICD-10-CM

## 2019-04-21 DIAGNOSIS — K209 Esophagitis, unspecified without bleeding: Secondary | ICD-10-CM

## 2019-04-21 DIAGNOSIS — J9611 Chronic respiratory failure with hypoxia: Secondary | ICD-10-CM

## 2019-04-21 DIAGNOSIS — J449 Chronic obstructive pulmonary disease, unspecified: Secondary | ICD-10-CM

## 2019-04-21 DIAGNOSIS — I2510 Atherosclerotic heart disease of native coronary artery without angina pectoris: Secondary | ICD-10-CM

## 2019-04-21 DIAGNOSIS — C3431 Malignant neoplasm of lower lobe, right bronchus or lung: Secondary | ICD-10-CM

## 2019-04-21 DIAGNOSIS — I10 Essential (primary) hypertension: Secondary | ICD-10-CM

## 2019-04-21 DIAGNOSIS — E538 Deficiency of other specified B group vitamins: Secondary | ICD-10-CM

## 2019-04-21 DIAGNOSIS — N183 Chronic kidney disease, stage 3 (moderate): Secondary | ICD-10-CM

## 2019-04-21 DIAGNOSIS — K259 Gastric ulcer, unspecified as acute or chronic, without hemorrhage or perforation: Secondary | ICD-10-CM | POA: Diagnosis present

## 2019-04-21 DIAGNOSIS — I5032 Chronic diastolic (congestive) heart failure: Secondary | ICD-10-CM

## 2019-04-21 HISTORY — PX: ESOPHAGOGASTRODUODENOSCOPY (EGD) WITH PROPOFOL: SHX5813

## 2019-04-21 HISTORY — PX: BIOPSY: SHX5522

## 2019-04-21 LAB — CBC
HCT: 24.3 % — ABNORMAL LOW (ref 39.0–52.0)
Hemoglobin: 7.7 g/dL — ABNORMAL LOW (ref 13.0–17.0)
MCH: 30.3 pg (ref 26.0–34.0)
MCHC: 31.7 g/dL (ref 30.0–36.0)
MCV: 95.7 fL (ref 80.0–100.0)
Platelets: 425 10*3/uL — ABNORMAL HIGH (ref 150–400)
RBC: 2.54 MIL/uL — ABNORMAL LOW (ref 4.22–5.81)
RDW: 17.7 % — ABNORMAL HIGH (ref 11.5–15.5)
WBC: 11.5 10*3/uL — ABNORMAL HIGH (ref 4.0–10.5)
nRBC: 0.7 % — ABNORMAL HIGH (ref 0.0–0.2)

## 2019-04-21 LAB — TYPE AND SCREEN
ABO/RH(D): O POS
Antibody Screen: NEGATIVE
Unit division: 0

## 2019-04-21 LAB — BPAM RBC
Blood Product Expiration Date: 202005012359
ISSUE DATE / TIME: 202004210642
Unit Type and Rh: 5100

## 2019-04-21 LAB — COMPREHENSIVE METABOLIC PANEL
ALT: 11 U/L (ref 0–44)
AST: 14 U/L — ABNORMAL LOW (ref 15–41)
Albumin: 2.5 g/dL — ABNORMAL LOW (ref 3.5–5.0)
Alkaline Phosphatase: 65 U/L (ref 38–126)
Anion gap: 12 (ref 5–15)
BUN: 26 mg/dL — ABNORMAL HIGH (ref 8–23)
CO2: 21 mmol/L — ABNORMAL LOW (ref 22–32)
Calcium: 8.5 mg/dL — ABNORMAL LOW (ref 8.9–10.3)
Chloride: 106 mmol/L (ref 98–111)
Creatinine, Ser: 1.34 mg/dL — ABNORMAL HIGH (ref 0.61–1.24)
GFR calc Af Amer: >60 mL/min (ref >60)
GFR calc non Af Amer: 52 mL/min — ABNORMAL LOW (ref >60)
Glucose, Bld: 104 mg/dL — ABNORMAL HIGH (ref 70–99)
Potassium: 4.1 mmol/L (ref 3.5–5.1)
Sodium: 139 mmol/L (ref 135–145)
Total Bilirubin: 0.9 mg/dL (ref 0.3–1.2)
Total Protein: 6 g/dL — ABNORMAL LOW (ref 6.5–8.1)

## 2019-04-21 LAB — GLUCOSE, CAPILLARY
Glucose-Capillary: 105 mg/dL — ABNORMAL HIGH (ref 70–99)
Glucose-Capillary: 107 mg/dL — ABNORMAL HIGH (ref 70–99)
Glucose-Capillary: 89 mg/dL (ref 70–99)
Glucose-Capillary: 95 mg/dL (ref 70–99)
Glucose-Capillary: 115 mg/dL — ABNORMAL HIGH (ref 70–99)
Glucose-Capillary: 101 mg/dL — ABNORMAL HIGH (ref 70–99)

## 2019-04-21 LAB — MAGNESIUM: Magnesium: 2.3 mg/dL (ref 1.7–2.4)

## 2019-04-21 SURGERY — ESOPHAGOGASTRODUODENOSCOPY (EGD) WITH PROPOFOL
Anesthesia: General

## 2019-04-21 MED ORDER — DEXAMETHASONE SODIUM PHOSPHATE 10 MG/ML IJ SOLN
INTRAMUSCULAR | Status: DC | PRN
  Administered 2019-04-21: 4 mg via INTRAVENOUS

## 2019-04-21 MED ORDER — LACTATED RINGERS IV SOLN
INTRAVENOUS | Status: DC | PRN
  Administered 2019-04-21: 14:00:00 via INTRAVENOUS

## 2019-04-21 MED ORDER — IOHEXOL 300 MG/ML  SOLN
100.0000 mL | Freq: Once | INTRAMUSCULAR | Status: AC | PRN
  Administered 2019-04-21: 100 mL via INTRAVENOUS

## 2019-04-21 MED ORDER — PROPOFOL 10 MG/ML IV BOLUS
INTRAVENOUS | Status: DC | PRN
  Administered 2019-04-21: 140 mg via INTRAVENOUS

## 2019-04-21 MED ORDER — FENTANYL CITRATE (PF) 250 MCG/5ML IJ SOLN
INTRAMUSCULAR | Status: DC | PRN
  Administered 2019-04-21: 100 ug via INTRAVENOUS

## 2019-04-21 MED ORDER — INSULIN ASPART 100 UNIT/ML ~~LOC~~ SOLN: 0.0000 [IU] | Freq: Every day | SUBCUTANEOUS | Status: DC

## 2019-04-21 MED ORDER — INSULIN ASPART 100 UNIT/ML ~~LOC~~ SOLN: 0.0000 [IU] | Freq: Three times a day (TID) | SUBCUTANEOUS | Status: DC

## 2019-04-21 MED ORDER — SODIUM CHLORIDE 0.9 % IV SOLN
8.0000 mg/h | INTRAVENOUS | Status: DC
  Administered 2019-04-21 – 2019-04-23 (×4): 8 mg/h via INTRAVENOUS
  Filled 2019-04-21 (×6): qty 80

## 2019-04-21 MED ORDER — LIDOCAINE 2% (20 MG/ML) 5 ML SYRINGE
INTRAMUSCULAR | Status: DC | PRN
  Administered 2019-04-21: 100 mg via INTRAVENOUS

## 2019-04-21 MED ORDER — PANTOPRAZOLE SODIUM 40 MG IV SOLR: 40.0000 mg | Freq: Two times a day (BID) | INTRAVENOUS | Status: DC

## 2019-04-21 MED ORDER — SODIUM CHLORIDE 0.9 % IV SOLN
80.0000 mg | Freq: Once | INTRAVENOUS | Status: AC
  Administered 2019-04-21: 80 mg via INTRAVENOUS
  Filled 2019-04-21 (×2): qty 80

## 2019-04-21 MED ORDER — SUCCINYLCHOLINE CHLORIDE 200 MG/10ML IV SOSY
PREFILLED_SYRINGE | INTRAVENOUS | Status: DC | PRN
  Administered 2019-04-21: 100 mg via INTRAVENOUS

## 2019-04-21 MED ORDER — ONDANSETRON HCL 4 MG/2ML IJ SOLN
INTRAMUSCULAR | Status: DC | PRN
  Administered 2019-04-21: 4 mg via INTRAVENOUS

## 2019-04-21 SURGICAL SUPPLY — 15 items

## 2019-04-21 NOTE — TOC Initial Note (Signed)
Transition of Care [facility]) - Initial/Assessment Note    Patient Details  Name: John [name] MRN: [id number] Date of Birth: [birth year]  [facility]) CM/SW Contact:    Benard Halsted, LCSW Phone Number: 04/21/2019, 3:51 PM  Clinical Narrative:                 CSW received request from MD to provide PACE information to the patient's family. Info placed on AVS for follow-up.   Expected Discharge Plan: Home/Self Care Barriers to Discharge: Continued Medical Work up   Patient Goals and CMS Choice Patient states their goals for this hospitalization and ongoing recovery are:: to go home CMS Medicare.gov Compare Post Acute Care list provided to:: Patient Choice offered to / list presented to : NA  Expected Discharge Plan and Services Expected Discharge Plan: Home/Self Care In-house Referral: NA Discharge Planning Services: CM Consult Post Acute Care Choice: NA Living arrangements for the past 2 months: Single Family Home                 DME Arranged: N/A DME Agency: NA HH Arranged: NA HH Agency: NA  Prior Living Arrangements/Services Living arrangements for the past 2 months: Single Family Home Lives with:: Spouse, Adult Children   Do you feel safe going back to the place where you live?: Yes               Activities of Daily Living Home Assistive Devices/Equipment: Environmental consultant (specify type), Eyeglasses, Dentures (specify type), Grab bars in shower, Oxygen ADL Screening (condition at time of admission) Patient's cognitive ability adequate to safely complete daily activities?: Yes Is the patient deaf or have difficulty hearing?: No Does the patient have difficulty seeing, even when wearing glasses/contacts?: No Does the patient have difficulty concentrating, remembering, or making decisions?: No Patient able to express need for assistance with ADLs?: Yes Does the patient have difficulty dressing or bathing?: No Independently performs ADLs?: Yes (appropriate for  developmental age) Communication: Independent Dressing (OT): Independent Grooming: Independent Feeding: Independent Does the patient have difficulty walking or climbing stairs?: No(due weakness) Weakness of Legs: Both Weakness of Arms/Hands: None  Permission Sought/Granted Permission sought to share information with : Case Manager, Family Supports Permission granted to share information with : Yes, Verbal Permission Granted              Emotional Assessment              Admission diagnosis:  Syncope [R55] Gastrointestinal hemorrhage with melena [K92.1] Patient Active Problem List   Diagnosis Date Noted  . Chronic duodenal ulcer with hemorrhage   . Multiple gastric ulcers   . Acute esophagitis   . Upper GI bleed 04/20/2019  . Vitamin B12 deficiency 04/20/2019  . Folate deficiency 04/20/2019  . Dehydration 04/20/2019  . Hypotension 04/20/2019  . Melena   . Acute blood loss anemia   . CKD (chronic kidney disease), stage III ([place]) 04/08/2019  . Right lower lobe lung mass 04/08/2019  . Syncope and collapse 04/08/2019  . Symptomatic anemia 04/08/2019  . Occult GI bleeding 04/08/2019  . Hyperglycemia 04/08/2019  . Leukocytosis 04/08/2019  . Syncope 04/08/2019  . Low hemoglobin   . Dyspnea on exertion 08/25/2017  . Bursitis of elbow 03/15/2016  . Obesity (BMI 30-39.9) 12/03/2015  . Primary cancer of right lower lobe of lung ([place]) 06/14/2015  . Solitary pulmonary nodule 03/13/2015  . RVF (right ventricular failure) ([place]) 11/04/2014  . COPD II/III with reversibility  12/19/2012  .  Chronic respiratory failure with hypoxia ([place]) 12/18/2012  . Drug-induced hyperglycemia 12/08/2012  . Allergic drug rash 12/06/2012  . Pulmonary HTN ([place]) 03/18/2012  . Abnormal echocardiogram 03/18/2012  . LV dysfunction 03/18/2012  . COPD exacerbation ([place]) 02/22/2012  . Chronic diastolic CHF (congestive heart failure) ([place]) 02/22/2012  . HTN (hypertension) 02/22/2012  . CAD (coronary  artery disease) 02/22/2012  . Acute on chronic systolic heart failure ([place]) 02/22/2012   PCP:  Patient, No Pcp Per Pharmacy:   [place], [place] - [address] [place] [address] Phone: [phone] Fax: [phone]     Social Determinants of Health (SDOH) Interventions    Readmission Risk Interventions No flowsheet data found.

## 2019-04-21 NOTE — Plan of Care (Signed)

## 2019-04-21 NOTE — Progress Notes (Addendum)
PROGRESS NOTE    John [name]  ZSW:[id number] DOB: 09-Mar-1946 DOA: 04/20/2019 PCP: Patient, No Pcp Per      Brief Narrative:  [name] is a 73 y.o. M with HTN, dCHF, CAD, COPD on home O2, and hx remote NSCLC s/p radiation who presented with syncope.  Patient recently admitted for symptomatic anemia.  Transfused 2 units, discharged with plans in place for outpatient endoscopy.    On day of admission, stood up suddenly, became dizzy and passed out.  In ER, Hgb stable from previous, although he noted melena since discharge.  FOBT+.  GI consulted and admitted for EGD.      Assessment & Plan:  Syncope This occurred in the context of orthostasis in the setting of GI bleeding and hypovolemia.  No further cardiogenic syncope work-up recommended.   Acute on chronic anemia Macrocytic anemia Possible GI bleeding Hgb 6.5 g/dL yesterday after fluids, dilutional.  Transfused 1 unit, Hemoglobin 7.7, appropriate rise. -Consult gastroenterology, appreciate expert cares -Endoscopy today -Continue PPI IV  Chronic kidney disease stage III Stable relative to baseline, 1.3  Lung mass History of NSCLC -CT chest after endoscopy -he has oncology follow-up already arranged  COPD Chronic hypoxic respiratory failure -Continue home oxygen -Continue ICS/lama -Continue as needed bronchodilator  Diabetes -Continue sliding scale corrections  Dementia -Look into PACE         MDM and disposition: The below labs and imaging reports were reviewed and summarized above.  Medication management as above.  The patient was admitted with syncope from hypovolemia and acute or chronic blood loss anemia.  Transfused 1 unit, now to endoscopy.  Further disposition pending endsocopy.  At this time, continued inpatient services are reasonable and expected, given the patient's: age, co-morbid dementia, chronic kidney disease, history of cancer, ongoing IV protonix, and the high likelihood of an  adverse outcome, including readmission, debility or death if the patient were to be discharged prematurely.       DVT prophylaxis: SCDs Code Status: FULL Family Communication: Attempted to reach daughter by phone, no answer    Consultants:   GI  Procedures:   Endoscopy 4/22 pending  Antimicrobials:   None    Subjective: Feeling well.  No melena, hematochezia, syncope, seizures, passing out, fever, cough, chest pain, or palpitations.  Objective: Vitals:   04/21/19 0500 04/21/19 0811 04/21/19 0813 04/21/19 0900  BP:    138/61  Pulse:    84  Resp:    (!) 28  Temp:  98.2 F (36.8 C)    TempSrc:  Oral    SpO2:   98% (!) 39%  Weight: 90.7 kg     Height:        Intake/Output Summary (Last 24 hours) at 04/21/2019 1138 Last data filed at 04/21/2019 3220 Gross per 24 hour  Intake 2142.42 ml  Output 2050 ml  Net 92.42 ml   Filed Weights   04/20/19 0127 04/21/19 0500  Weight: 88.7 kg 90.7 kg    Examination: General appearance:  adult male, alert and in no acute distress.   HEENT: Anicteric, conjunctiva pink, lids and lashes normal. No nasal deformity, discharge, epistaxis.  Lips moist, dentures in place, no oral lesions, OP moist, hearing diminished.   Skin: Warm and dry.  No suspicious rashes or lesions. Cardiac: RRR, nl S1-S2, no murmurs appreciated.  Capillary refill is brisk.  JVP normal.  No LE edema.  Radial pulses 2+ and symmetric. Respiratory: Normal respiratory rate and rhythm.  CTAB without rales or  wheezes. Abdomen: Abdomen soft.  no TTP. No ascites, distension, hepatosplenomegaly.   MSK: No deformities or effusions. Neuro: Awake and alert.  EOMI, moves all extremities. Speech fluent.    Psych: Sensorium intact and responding to questions, attention normal. Affect normal, pleasant.  Judgment and insight appear moderately impaired by dementia.  Oriented to self, place.  Not oriented to month ("October?")    Data Reviewed: I have personally reviewed  following labs and imaging studies:  CBC: Recent Labs  Lab 04/20/19 0147 04/20/19 0351 04/20/19 1140 04/21/19 0420  WBC 13.9*  --   --  11.5*  NEUTROABS 11.6*  --   --   --   HGB 7.9* 6.5* 8.6* 7.7*  HCT 26.2* 22.4* 26.9* 24.3*  MCV 100.4*  --   --  95.7  PLT 557*  --   --  096*   Basic Metabolic Panel: Recent Labs  Lab 04/20/19 0147 04/20/19 0351 04/20/19 0900 04/21/19 0420  NA 136 140  --  139  K 4.5 3.4*  --  4.1  CL 96* 111  --  106  CO2 22 17*  --  21*  GLUCOSE 217* 135*  --  104*  BUN 39* 34*  --  26*  CREATININE 1.97* 1.50*  --  1.34*  CALCIUM 8.9 6.7*  --  8.5*  MG  --   --  1.4* 2.3   GFR: Estimated Creatinine Clearance: 50.1 mL/min (A) (by C-G formula based on SCr of 1.34 mg/dL (H)). Liver Function Tests: Recent Labs  Lab 04/20/19 0147 04/20/19 0351 04/21/19 0420  AST 19 14* 14*  ALT 12 8 11   ALKPHOS 73 55 65  BILITOT 1.0 0.6 0.9  PROT 6.8 4.8* 6.0*  ALBUMIN 2.8* 2.0* 2.5*   No results for input(s): LIPASE, AMYLASE in the last 168 hours. No results for input(s): AMMONIA in the last 168 hours. Coagulation Profile: Recent Labs  Lab 04/20/19 0147  INR 1.4*   Cardiac Enzymes: Recent Labs  Lab 04/20/19 0147  TROPONINI <0.03   BNP (last 3 results) No results for input(s): PROBNP in the last 8760 hours. HbA1C: No results for input(s): HGBA1C in the last 72 hours. CBG: Recent Labs  Lab 04/20/19 1525 04/20/19 2129 04/21/19 0225 04/21/19 0550 04/21/19 0807  GLUCAP 143* 142* 105* 107* 89   Lipid Profile: No results for input(s): CHOL, HDL, LDLCALC, TRIG, CHOLHDL, LDLDIRECT in the last 72 hours. Thyroid Function Tests: No results for input(s): TSH, T4TOTAL, FREET4, T3FREE, THYROIDAB in the last 72 hours. Anemia Panel: No results for input(s): VITAMINB12, FOLATE, FERRITIN, TIBC, IRON, RETICCTPCT in the last 72 hours. Urine analysis:    Component Value Date/Time   COLORURINE YELLOW 04/03/2017 2005   APPEARANCEUR CLEAR 04/03/2017 2005     LABSPEC 1.016 04/03/2017 2005   PHURINE 5.0 04/03/2017 2005   GLUCOSEU NEGATIVE 04/03/2017 2005   HGBUR NEGATIVE 04/03/2017 2005   BILIRUBINUR NEGATIVE 04/03/2017 2005   KETONESUR NEGATIVE 04/03/2017 2005   PROTEINUR NEGATIVE 04/03/2017 2005   UROBILINOGEN 1.0 02/22/2012 0238   NITRITE NEGATIVE 04/03/2017 2005   LEUKOCYTESUR NEGATIVE 04/03/2017 2005   Sepsis Labs: @LABRCNTIP (procalcitonin:4,lacticacidven:4)  ) Recent Results (from the past 240 hour(s))  MRSA PCR Screening     Status: None   Collection Time: 04/20/19  5:10 AM  Result Value Ref Range Status   MRSA by PCR NEGATIVE NEGATIVE Final    Comment:        The GeneXpert MRSA Assay (FDA approved for NASAL specimens only), is one  component of a comprehensive MRSA colonization surveillance program. It is not intended to diagnose MRSA infection nor to guide or monitor treatment for MRSA infections. Performed at [facility], [place] [address] [place], [place] [address]          Radiology Studies: Dg Chest 1 View  Result Date: 04/20/2019 CLINICAL DATA:  Fall EXAM: CHEST  1 VIEW COMPARISON:  04/08/2019 FINDINGS: Right lower lobe mass again noted, unchanged since prior study measuring approximately 5.3 cm. No confluent opacity on the left. Heart is normal size. No effusions or acute bony abnormality. IMPRESSION: Stable right lower lobe mass. Cannot exclude neoplasm. This could be further evaluated with chest CT. Electronically Signed   By: Rolm Baptise M.D.   On: 04/20/2019 02:17   Ct Head Wo Contrast  Result Date: 04/20/2019 CLINICAL DATA:  Fall with headache EXAM: CT HEAD WITHOUT CONTRAST CT CERVICAL SPINE WITHOUT CONTRAST TECHNIQUE: Multidetector CT imaging of the head and cervical spine was performed following the standard protocol without intravenous contrast. Multiplanar CT image reconstructions of the cervical spine were also generated. COMPARISON:  04/08/2019 head CT FINDINGS: CT HEAD FINDINGS Brain: There is  no mass, hemorrhage or extra-axial collection. There is generalized atrophy without lobar predilection. There is hypoattenuation of the periventricular white matter, most commonly indicating chronic ischemic microangiopathy. Vascular: No abnormal hyperdensity of the major intracranial arteries or dural venous sinuses. No intracranial atherosclerosis. Skull: The visualized skull base, calvarium and extracranial soft tissues are normal. Sinuses/Orbits: No fluid levels or advanced mucosal thickening of the visualized paranasal sinuses. No mastoid or middle ear effusion. The orbits are normal. CT CERVICAL SPINE FINDINGS Alignment: No static subluxation. Facets are aligned. Occipital condyles are normally positioned. Skull base and vertebrae: No acute fracture. Soft tissues and spinal canal: No prevertebral fluid or swelling. No visible canal hematoma. Disc levels: No advanced spinal canal or neural foraminal stenosis. Upper chest: No pneumothorax, pulmonary nodule or pleural effusion. Other: Bilateral carotid bifurcation atherosclerosis. IMPRESSION: 1. Generalized atrophy and findings of chronic ischemic microangiopathy without acute intracranial abnormality. 2. No acute fracture or static subluxation of the cervical spine. Electronically Signed   By: Ulyses Jarred M.D.   On: 04/20/2019 03:10   Ct Cervical Spine Wo Contrast  Result Date: 04/20/2019 CLINICAL DATA:  Fall with headache EXAM: CT HEAD WITHOUT CONTRAST CT CERVICAL SPINE WITHOUT CONTRAST TECHNIQUE: Multidetector CT imaging of the head and cervical spine was performed following the standard protocol without intravenous contrast. Multiplanar CT image reconstructions of the cervical spine were also generated. COMPARISON:  04/08/2019 head CT FINDINGS: CT HEAD FINDINGS Brain: There is no mass, hemorrhage or extra-axial collection. There is generalized atrophy without lobar predilection. There is hypoattenuation of the periventricular white matter, most commonly  indicating chronic ischemic microangiopathy. Vascular: No abnormal hyperdensity of the major intracranial arteries or dural venous sinuses. No intracranial atherosclerosis. Skull: The visualized skull base, calvarium and extracranial soft tissues are normal. Sinuses/Orbits: No fluid levels or advanced mucosal thickening of the visualized paranasal sinuses. No mastoid or middle ear effusion. The orbits are normal. CT CERVICAL SPINE FINDINGS Alignment: No static subluxation. Facets are aligned. Occipital condyles are normally positioned. Skull base and vertebrae: No acute fracture. Soft tissues and spinal canal: No prevertebral fluid or swelling. No visible canal hematoma. Disc levels: No advanced spinal canal or neural foraminal stenosis. Upper chest: No pneumothorax, pulmonary nodule or pleural effusion. Other: Bilateral carotid bifurcation atherosclerosis. IMPRESSION: 1. Generalized atrophy and findings of chronic ischemic microangiopathy without acute intracranial  abnormality. 2. No acute fracture or static subluxation of the cervical spine. Electronically Signed   By: Ulyses Jarred M.D.   On: 04/20/2019 03:10        Scheduled Meds:  cyanocobalamin  1,000 mcg Subcutaneous Daily   folic acid  1 mg Oral Daily   insulin aspart  0-9 Units Subcutaneous Q4H   mometasone-formoterol  2 puff Inhalation BID   pantoprazole (PROTONIX) IV  40 mg Intravenous Q12H   sodium chloride flush  3 mL Intravenous Q12H   Continuous Infusions:  sodium chloride 75 mL/hr at 04/21/19 0906     LOS: 1 day    Time spent: 25 minutes    Edwin Dada, MD [facility] 04/21/2019, 11:38 AM     Please page through [place]:  [email or web address] Password [email or web address] If 7PM-7AM, please contact night-coverage

## 2019-04-21 NOTE — Interval H&P Note (Signed)
History and Physical Interval Note:  04/21/2019 1:50 PM  John [name]  has presented today for surgery, with the diagnosis of anemia, melena.  The various methods of treatment have been discussed with the patient and family. After consideration of risks, benefits and other options for treatment, the patient has consented to  Procedure(s): ESOPHAGOGASTRODUODENOSCOPY (EGD) WITH PROPOFOL (N/A) as a surgical intervention.  The patient's history has been reviewed, patient examined, no change in status, stable for surgery.  I have reviewed the patient's chart and labs.  Questions were answered to the patient's satisfaction.     Silvano Rusk

## 2019-04-21 NOTE — Op Note (Addendum)
[facility] Patient Name: John [name] Procedure Date : 04/21/2019 MRN: [id number] Attending MD: Gatha Mayer , MD Date of Birth: 06-27-1946 CSN: [id number] Age: 73 Admit Type: Outpatient Procedure:                Upper GI endoscopy Indications:              Melena Providers:                Gatha Mayer, MD, Glori Bickers, RN, Carlyn Reichert, RN, William Dalton, Technician, Ladona Ridgel, Technician Referring MD:              Medicines:                General Anesthesia Complications:            No immediate complications. Estimated Blood Loss:     Estimated blood loss was minimal. Procedure:                Pre-Anesthesia Assessment:                           - Prior to the procedure, a History and Physical                            was performed, and patient medications and                            allergies were reviewed. The patient's tolerance of                            previous anesthesia was also reviewed. The risks                            and benefits of the procedure and the sedation                            options and risks were discussed with the patient.                            All questions were answered, and informed consent                            was obtained. Prior Anticoagulants: The patient has                            taken no previous anticoagulant or antiplatelet                            agents. ASA Grade Assessment: III - A patient with  severe systemic disease. After reviewing the risks                            and benefits, the patient was deemed in                            satisfactory condition to undergo the procedure.                           After obtaining informed consent, the endoscope was                            passed under direct vision. Throughout the                            procedure, the patient's blood pressure,  pulse, and                            oxygen saturations were monitored continuously. The                            [id number]-H190 ([id number] Olympus gastroscope was                            introduced through the mouth, and advanced to the                            second part of duodenum. The upper GI endoscopy was                            accomplished without difficulty. The patient                            tolerated the procedure well. Scope In: Scope Out: Findings:      One non-bleeding cratered duodenal ulcer with adherent clot was found in       the duodenal bulb. See below for additional description      Patchy mucosal changes characterized by ulceration were found in the       prepyloric region of the stomach.      LA Grade C (one or more mucosal breaks continuous between tops of 2 or       more mucosal folds, less than 75% circumference) esophagitis with no       bleeding was found in the distal esophagus.      The exam was otherwise without abnormality.      The cardia and gastric fundus were normal on retroflexion.      Biopsies were taken with a cold forceps in the gastric antrum for       Helicobacter pylori testing using CLOtest. Verification of patient       identification for the specimen was done. Estimated blood loss was       minimal. Impression:               - Deep and large Non-bleeding duodenal ulcer with  adherent clot. Higher-risk of rebleeding - odd                            situation as I can enter duodenum via pylorus or                            this uvler creater so either duodenal ulcer that                            fistulized to stomach or vice versa. Not treated as                            I think attempting endoscopic therapy in this                            seeting - if these adherent clots are non-bleeding                            visble vessels they are large and at higher risk of                             treatment-related bleeding problems.                           - Ulcerated mucosa in the prepyloric region of the                            stomach.                           - LA Grade C reflux esophagitis.                           - The examination was otherwise normal.                           - Biopsies were taken with a cold forceps for                            Helicobacter pylori testing using CLOtest. Recommendation:           - Clear liquid diet.                           - Continue present medications. Back on PPI                            infusion - may need higher level of care will                            discuss w/ hospitalist. If rebleeds rescoping                            reasonable depending upon what CT tells [place] and  clinical scenario - could need embolization Tx                            instead.                           - Await pathology results. CLO test                           - Return patient to hospital ward for ongoing care.                           - Suspect Goody's, Ibuprofen responsible for all of                            this - malignancy in duodenum unlikely - given all                            of his issues will CT chest and abdomen/pelvis to                            better understand process.                           - depending upon clinical course will need f/u EGD                            at least 2 mos down road Procedure Code(s):        --- Professional ---                           [phone], Esophagogastroduodenoscopy, flexible,                            transoral; with biopsy, single or multiple Diagnosis Code(s):        --- Professional ---                           K26.4, Chronic or unspecified duodenal ulcer with                            hemorrhage                           K25.9, Gastric ulcer, unspecified as acute or                            chronic, without hemorrhage or perforation                            K21.0, Gastro-esophageal reflux disease with                            esophagitis  K92.1, Melena (includes Hematochezia) CPT copyright 2019 [employer]. All rights reserved. The codes documented in this report are preliminary and upon coder review may  be revised to meet current compliance requirements. Gatha Mayer, MD 04/21/2019 2:37:11 PM This report has been signed electronically. Number of Addenda: 0

## 2019-04-21 NOTE — Transfer of Care (Signed)
Immediate Anesthesia Transfer of Care Note  Patient: John [name]  Procedure(s) Performed: ESOPHAGOGASTRODUODENOSCOPY (EGD) WITH PROPOFOL (N/A ) BIOPSY  Patient Location: PACU  Anesthesia Type:General  Level of Consciousness: awake, drowsy and patient cooperative  Airway & Oxygen Therapy: Patient Spontanous Breathing and Patient connected to face mask oxygen  Post-op Assessment: Report given to RN and Post -op Vital signs reviewed and stable  Post vital signs: Reviewed and stable  Last Vitals:  Vitals Value Taken Time  BP 132/36 04/21/2019  2:22 PM  Temp 36.5 C 04/21/2019  2:21 PM  Pulse 91 04/21/2019  2:23 PM  Resp 26 04/21/2019  2:23 PM  SpO2 100 % 04/21/2019  2:23 PM  Vitals shown include unvalidated device data.  Last Pain:  Vitals:   04/21/19 1421  TempSrc: Oral  PainSc: 0-No pain         Complications: No apparent anesthesia complications

## 2019-04-21 NOTE — Progress Notes (Signed)
Per anesthesia, prior to intubation, pt noted to have canker sore on inside of bottom right lip

## 2019-04-21 NOTE — Progress Notes (Signed)
Call placed to daughter, updated on condition.

## 2019-04-21 NOTE — Anesthesia Preprocedure Evaluation (Signed)
Anesthesia Evaluation  Patient identified by MRN, date of birth, ID band Patient awake    Reviewed: Allergy & Precautions, NPO status , Patient's Chart, lab work & pertinent test results  Airway Mallampati: II  TM Distance: >3 FB Neck ROM: Full    Dental no notable dental hx. 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   Pulmonary exam normal breath sounds clear to auscultation       Cardiovascular hypertension, + CAD and +CHF  Normal cardiovascular exam Rhythm:Regular Rate:Normal     Neuro/Psych negative neurological ROS     GI/Hepatic negative GI ROS, Neg liver ROS,   Endo/Other  diabetes  Renal/GU Cr 1.34 K+ 4.1     Musculoskeletal   Abdominal   Peds  Hematology  (+) anemia , Hgb 7.7   Anesthesia Other Findings   Reproductive/Obstetrics                            Anesthesia Physical Anesthesia Plan  ASA: IV  Anesthesia Plan: General   Post-op Pain Management:    Induction: Intravenous, Cricoid pressure planned and Rapid sequence  PONV Risk Score and Plan: Treatment may vary due to age or medical condition, Ondansetron and Dexamethasone  Airway Management Planned: Oral ETT  Additional Equipment:   Intra-op Plan:   Post-operative Plan: Extubation in OR  Informed Consent: I have reviewed the patients History and Physical, chart, labs and discussed the procedure including the risks, benefits and alternatives for the proposed anesthesia with the patient or authorized representative who has indicated his/her understanding and acceptance.     Dental advisory given  Plan Discussed with:   Anesthesia Plan Comments:         Anesthesia Quick Evaluation

## 2019-04-21 NOTE — Anesthesia Procedure Notes (Signed)
Procedure Name: Intubation Date/Time: 04/21/2019 1:51 PM Performed by: Renato Shin, CRNA Pre-anesthesia Checklist: Patient identified, Emergency Drugs available, Suction available and Patient being monitored Patient Re-evaluated:Patient Re-evaluated prior to induction Oxygen Delivery Method: Circle system utilized Preoxygenation: Pre-oxygenation with 100% oxygen Induction Type: IV induction and Rapid sequence Laryngoscope Size: Miller and 2 Grade View: Grade II Tube type: Oral Tube size: 8.0 mm Number of attempts: 1 Airway Equipment and Method: Stylet and Oral airway Placement Confirmation: ETT inserted through vocal cords under direct vision,  positive ETCO2 and breath sounds checked- equal and bilateral Secured at: 21 cm Tube secured with: Tape Dental Injury: Teeth and Oropharynx as per pre-operative assessment

## 2019-04-21 NOTE — Evaluation (Signed)
Physical Therapy Evaluation Patient Details Name: John [name] MRN: [id number] DOB: 1946-06-04 Today's Date: 04/21/2019   History of Present Illness  John [name] is a 73 y.o. M with HTN, dCHF, CAD, COPD on home O2, and hx remote NSCLC s/p radiation who presented with syncope.  Patient recently admitted for symptomatic anemia.  Transfused 2 units, discharged with plans in place for outpatient endoscopy.   Clinical Impression  Pt admitted with above. Pt denies dizziness. Pt ambulated 200' without AD and SpO2 dec to 82% on RA. RN notified. Pt also remains mildly orthostatic. Acute PT to cont to follow.    Follow Up Recommendations Home health PT;Supervision/Assistance - 24 hour(may progress and not need it)    Equipment Recommendations  None recommended by PT    Recommendations for Other Services       Precautions / Restrictions Precautions Precautions: Fall Restrictions Weight Bearing Restrictions: No      Mobility  Bed Mobility Overal bed mobility: Needs Assistance Bed Mobility: Supine to Sit     Supine to sit: Supervision     General bed mobility comments: HOB elevated, no use of bed rails or physical assist, supervision for safety due to mildly impulsive/quick  Transfers Overall transfer level: Needs assistance Equipment used: None Transfers: Sit to/from Stand Sit to Stand: Min guard         General transfer comment: pt quick to move, min guard for safety, swaying ant/post upon initial stand  Ambulation/Gait Ambulation/Gait assistance: Min guard Gait Distance (Feet): 200 Feet Assistive device: None Gait Pattern/deviations: Step-through pattern;Decreased stride length Gait velocity: wfl for age Gait velocity interpretation: 1.31 - 2.62 ft/sec, indicative of limited community ambulator General Gait Details: pt with +SOB, SpO2 at 82% on RA, s/p 2 min rest pt at 90% SpO2 on RA. RN notified, pt denies dizziness with ambulation  Stairs            Wheelchair  Mobility    Modified Rankin (Stroke Patients Only)       Balance Overall balance assessment: Mild deficits observed, not formally tested                                           Pertinent Vitals/Pain Pain Assessment: No/denies pain    Home Living Family/patient expects to be discharged to:: Private residence Living Arrangements: Other relatives(neice and daughter) Available Help at Discharge: Family;Available 24 hours/day(neice home all the time but not dtr) Type of Home: House Home Access: Stairs to enter Entrance Stairs-Rails: None Entrance Stairs-Number of Steps: 1 Home Layout: One level Home Equipment: Walker - 2 wheels;Cane - single point      Prior Function Level of Independence: Independent         Comments: occasionally uses cane or walker but predominately amb without AD, will walk to grocery store     Hand Dominance   Dominant Hand: Right    Extremity/Trunk Assessment   Upper Extremity Assessment Upper Extremity Assessment: Generalized weakness    Lower Extremity Assessment Lower Extremity Assessment: Generalized weakness    Cervical / Trunk Assessment Cervical / Trunk Assessment: Kyphotic  Communication   Communication: No difficulties  Cognition Arousal/Alertness: Awake/alert Behavior During Therapy: WFL for tasks assessed/performed Overall Cognitive Status: Within Functional Limits for tasks assessed  General Comments General comments (skin integrity, edema, etc.): watch SpO2 and BP with ambulation, Pt with mild drop in BP to 518 systolic from 841 in sitting    Exercises     Assessment/Plan    PT Assessment Patient needs continued PT services  PT Problem List Decreased strength;Decreased activity tolerance;Decreased balance;Decreased mobility;Decreased safety awareness       PT Treatment Interventions DME instruction;Gait training;Stair training;Functional  mobility training;Therapeutic activities;Therapeutic exercise;Balance training    PT Goals (Current goals can be found in the Care Plan section)  Acute Rehab PT Goals Patient Stated Goal: home PT Goal Formulation: With patient Time For Goal Achievement: 05/05/19 Potential to Achieve Goals: Good    Frequency Min 3X/week   Barriers to discharge        Co-evaluation               AM-PAC PT "6 Clicks" Mobility  Outcome Measure Help needed turning from your back to your side while in a flat bed without using bedrails?: None Help needed moving from lying on your back to sitting on the side of a flat bed without using bedrails?: None Help needed moving to and from a bed to a chair (including a wheelchair)?: A Little Help needed standing up from a chair using your arms (e.g., wheelchair or bedside chair)?: A Little Help needed to walk in hospital room?: A Little Help needed climbing 3-5 steps with a railing? : A Little 6 Click Score: 20    End of Session   Activity Tolerance: Patient tolerated treatment well Patient left: in chair(in transport chair for EGD) Nurse Communication: Mobility status PT Visit Diagnosis: Unsteadiness on feet (R26.81)    Time: 6606-3016 PT Time Calculation (min) (ACUTE ONLY): 28 min   Charges:   PT Evaluation $PT Eval Moderate Complexity: 1 Mod PT Treatments $Gait Training: 8-22 mins        Kittie Plater, PT, DPT Acute Rehabilitation Services Pager #: [phone] Office #: [phone]   Berline Lopes 04/21/2019, 1:27 PM

## 2019-04-21 NOTE — Anesthesia Postprocedure Evaluation (Signed)
Anesthesia Post Note  Patient: John [name]  Procedure(s) Performed: ESOPHAGOGASTRODUODENOSCOPY (EGD) WITH PROPOFOL (N/A ) BIOPSY     Patient location during evaluation: Endoscopy Anesthesia Type: General Level of consciousness: awake and alert Pain management: pain level controlled Vital Signs Assessment: post-procedure vital signs reviewed and stable Respiratory status: spontaneous breathing, nonlabored ventilation, respiratory function stable and patient connected to nasal cannula oxygen Cardiovascular status: blood pressure returned to baseline and stable Postop Assessment: no apparent nausea or vomiting Anesthetic complications: no    Last Vitals:  Vitals:   04/21/19 1450 04/21/19 1455  BP: (!) 173/58 (!) 173/58  Pulse: 92 90  Resp: (!) 25 (!) 34  Temp:    SpO2: 96% 93%    Last Pain:  Vitals:   04/21/19 1421  TempSrc: Oral  PainSc: 0-No pain                 Barnet Glasgow

## 2019-04-22 ENCOUNTER — Encounter (HOSPITAL_COMMUNITY): Payer: Self-pay | Admitting: Internal Medicine

## 2019-04-22 DIAGNOSIS — K2970 Gastritis, unspecified, without bleeding: Secondary | ICD-10-CM

## 2019-04-22 DIAGNOSIS — B9681 Helicobacter pylori [H. pylori] as the cause of diseases classified elsewhere: Secondary | ICD-10-CM | POA: Diagnosis present

## 2019-04-22 LAB — GLUCOSE, CAPILLARY
Glucose-Capillary: 104 mg/dL — ABNORMAL HIGH (ref 70–99)
Glucose-Capillary: 82 mg/dL (ref 70–99)
Glucose-Capillary: 87 mg/dL (ref 70–99)
Glucose-Capillary: 91 mg/dL (ref 70–99)
Glucose-Capillary: 96 mg/dL (ref 70–99)

## 2019-04-22 LAB — CBC
HCT: 23.6 % — ABNORMAL LOW (ref 39.0–52.0)
Hemoglobin: 7.4 g/dL — ABNORMAL LOW (ref 13.0–17.0)
MCH: 30.2 pg (ref 26.0–34.0)
MCHC: 31.4 g/dL (ref 30.0–36.0)
MCV: 96.3 fL (ref 80.0–100.0)
Platelets: 421 10*3/uL — ABNORMAL HIGH (ref 150–400)
RBC: 2.45 MIL/uL — ABNORMAL LOW (ref 4.22–5.81)
RDW: 17.5 % — ABNORMAL HIGH (ref 11.5–15.5)
WBC: 10.8 10*3/uL — ABNORMAL HIGH (ref 4.0–10.5)
nRBC: 0.6 % — ABNORMAL HIGH (ref 0.0–0.2)

## 2019-04-22 LAB — COMPREHENSIVE METABOLIC PANEL
ALT: 11 U/L (ref 0–44)
AST: 16 U/L (ref 15–41)
Albumin: 2.5 g/dL — ABNORMAL LOW (ref 3.5–5.0)
Alkaline Phosphatase: 69 U/L (ref 38–126)
Anion gap: 9 (ref 5–15)
BUN: 13 mg/dL (ref 8–23)
CO2: 22 mmol/L (ref 22–32)
Calcium: 8.4 mg/dL — ABNORMAL LOW (ref 8.9–10.3)
Chloride: 108 mmol/L (ref 98–111)
Creatinine, Ser: 1.38 mg/dL — ABNORMAL HIGH (ref 0.61–1.24)
GFR calc Af Amer: 58 mL/min — ABNORMAL LOW (ref >60)
GFR calc non Af Amer: 50 mL/min — ABNORMAL LOW (ref >60)
Glucose, Bld: 89 mg/dL (ref 70–99)
Potassium: 4 mmol/L (ref 3.5–5.1)
Sodium: 139 mmol/L (ref 135–145)
Total Bilirubin: 0.7 mg/dL (ref 0.3–1.2)
Total Protein: 6.1 g/dL — ABNORMAL LOW (ref 6.5–8.1)

## 2019-04-22 LAB — CLOTEST (H. PYLORI), BIOPSY: Helicobacter screen: POSITIVE — AB

## 2019-04-22 NOTE — Progress Notes (Signed)
Physical Therapy Treatment Patient Details Name: John [name] MRN: [id number] DOB: 12/24/1946 Today's Date: 04/22/2019    History of Present Illness John [name] is a 73 y.o. M with HTN, dCHF, CAD, COPD on home O2, and hx remote NSCLC s/p radiation who presented with syncope.  Patient recently admitted for symptomatic anemia.  Transfused 2 units, discharged with plans in place for outpatient endoscopy.     PT Comments    Patient seen for mobility progression. Pt requires min guard assist for safety with OOB mobility. Pt continues to become SOB while ambulating and requires standing rest break. SpO2 desat into 80s on RA and >90% on RA at rest. Continue to progress as tolerated.     Follow Up Recommendations  Home health PT;Supervision/Assistance - 24 hour(may progress and not need it)     Equipment Recommendations  None recommended by PT    Recommendations for Other Services       Precautions / Restrictions Precautions Precautions: Fall    Mobility  Bed Mobility Overal bed mobility: Modified Independent Bed Mobility: Supine to Sit              Transfers Overall transfer level: Needs assistance Equipment used: None Transfers: Sit to/from Stand Sit to Stand: Min guard         General transfer comment: min guard for safety upon standing  Ambulation/Gait Ambulation/Gait assistance: Min guard;Min assist Gait Distance (Feet): (130 ft X 2 with standing rest break) Assistive device: None Gait Pattern/deviations: Step-through pattern;Decreased stride length Gait velocity: decreased   General Gait Details: pt grossly requires min guard for safety with ambulation without AD however with L horizontal head turn/trunk rotation pt experience LOB requiring assistance to recover; pt SOB and with increased RR into 30s and SpO2 desat into 80s on RA; standing rest break required   Stairs             Wheelchair Mobility    Modified Rankin (Stroke Patients Only)        Balance Overall balance assessment: Mild deficits observed, not formally tested                                          Cognition Arousal/Alertness: Awake/alert Behavior During Therapy: WFL for tasks assessed/performed Overall Cognitive Status: Within Functional Limits for tasks assessed                                        Exercises      General Comments        Pertinent Vitals/Pain Pain Assessment: No/denies pain    Home Living                      Prior Function            PT Goals (current goals can now be found in the care plan section) Acute Rehab PT Goals Patient Stated Goal: home Progress towards PT goals: Progressing toward goals    Frequency    Min 3X/week      PT Plan Current plan remains appropriate    Co-evaluation              AM-PAC PT "6 Clicks" Mobility   Outcome Measure  Help needed turning from your back to your side while in  a flat bed without using bedrails?: None Help needed moving from lying on your back to sitting on the side of a flat bed without using bedrails?: None Help needed moving to and from a bed to a chair (including a wheelchair)?: A Little Help needed standing up from a chair using your arms (e.g., wheelchair or bedside chair)?: A Little Help needed to walk in hospital room?: A Little Help needed climbing 3-5 steps with a railing? : A Little 6 Click Score: 20    End of Session Equipment Utilized During Treatment: Gait belt Activity Tolerance: Patient tolerated treatment well Patient left: in chair;with call bell/phone within reach;with chair alarm set Nurse Communication: Mobility status PT Visit Diagnosis: Unsteadiness on feet (R26.81)     Time: 7412-8786 PT Time Calculation (min) (ACUTE ONLY): 28 min  Charges:  $Gait Training: 23-37 mins                     John [name], PTA Acute Rehabilitation Services Pager: [phone] Office: [phone]     John [name] 04/22/2019, 12:00 PM

## 2019-04-22 NOTE — Progress Notes (Signed)
   Patient Name: RUTH TULLY Date of Encounter: 04/22/2019, 10:14 AM    Subjective  No new c/o No stool recorded   Objective  BP (!) 119/54 (BP Location: Right Arm)   Pulse 87   Temp 98.8 F (37.1 C) (Oral)   Resp 18   Ht 5\' 5"  (1.651 m)   Wt 90.7 kg   SpO2 97%   BMI 33.28 kg/m  Elderly chronically ill Lungs cta ant Cor s1s2 abd soft and NT BS + Ext no edema Awake and alert, appropriate affect   CBC Latest Ref Rng & Units 04/22/2019 04/21/2019 04/20/2019  WBC 4.0 - 10.5 K/uL 10.8(H) 11.5(H) -  Hemoglobin 13.0 - 17.0 g/dL 7.4(L) 7.7(L) 8.6(L)  Hematocrit 39.0 - 52.0 % 23.6(L) 24.3(L) 26.9(L)  Platelets 150 - 400 K/uL 421(H) 425(H) -   CT scanning shows 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 Also demonstrates the ulcer changes I see on EGD - the fistula is very short (see below)  CLO test + H pylori   Assessment and Plan  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 doing this) with higher risk bleeding stigmata present Gastric ulcers - no stigmata H pylori + Esophagitis Acute blood loss anemia - drifting Hgb Lung cancer B12 def, Folate def NSAID/Salicylate use  Plan is to stay on PPI infusion today and tomorrow  Stay on clears and if stable tomorrow advance diet If all goes well possibly home Sat If signs of major bleeding will need to decide on IR vs repeat EGD - no need for surgery consult at this time I do not think Tx H pylori necessary right now and may just complicate things so will wait I updated his daughter by phone  Gatha Mayer, MD, [facility] 04/22/2019 10:14 AM Pager [phone]

## 2019-04-22 NOTE — Progress Notes (Signed)
PROGRESS NOTE    John [name]  ZOX:[id number] DOB: 24-Feb-1946 DOA: 04/20/2019 PCP: Patient, No Pcp Per      Brief Narrative:  [name] is a 73 y.o. M with HTN, dCHF, CAD, COPD on home O2, and hx remote NSCLC s/p radiation who presented with syncope.  Patient recently admitted for symptomatic anemia.  Transfused 2 units, discharged with plans in place for outpatient endoscopy.    On day of admission, stood up suddenly, became dizzy and passed out.  In ER, Hgb stable from previous, although he noted melena since discharge.  FOBT+.  GI consulted and admitted for EGD.      Assessment & Plan:  Acute on chronic anemia Macrocytic anemia Possible GI bleeding It appears during his last hospitalization, he had +FOBT but a macrocytosis plus folate and B12 deficiency both, as well as no gross melena in the hospital, and so it was theorized appropriately that he had simply a folate/B12 deficiency anemia.  He was referred to GI for positive occult blood, but endoscopy seemed reasonable to defer to the outpatient setting and he was discharged with vitamin supplementation.  However, he went home, took [facility] that same night, began to have melena again, and returned to the hospital with worsened anemia, reports of melena and syncope and it at that point became clear that he likely had a GI bleed driving his anemia.  Hgb 6.5 g/dL on admission, transfused 1 unit.  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  THis has stigmata for high risk.    Hgb stable this AM, no melena. -Clears  -Monitor stools -Continue PPI IV -Consult GI, appreciate expert cares -Continue folate and B12  Syncope This occurred in the context of orthostasis in the setting of GI bleeding and hypovolemia.  It is resolved, no further cardiogenic syncope work-up recommended.   Chronic kidney disease stage III stAble no change  Lung mass History of NSCLC CT  chest shows "new/enlarging irregular mass in right lower lobe, 4.8 cm inferior to the previously treated SCC" as well as "increasing volume of soft tissue with large pleural attachment, in the region of previously biopsied and treated carcinoma, 4.6 cm"  I conversed with Dr. Earlie Server about this electronically today. -Patient has follow-up with oncology on May 6  COPD Chronic hypoxic respiratory failure -Continue home oxygen -Continue ICS/LABA -Continue as needed bronchodilator  Diabetes Glucoses well controlled -Continue sliding scale correction insulin  Dementia I have asked social work to provide information about local PACE program to the family           MDM and disposition: The below labs and imaging reports were reviewed and summarized above.  Medication management as above.  The patient was admitted with syncope from hypovolemia and acute on chronic blood loss anemia.  He was transfused 1 unit, went to endoscopy yesterday which showed a chronic duodenal ulcer with stigmata of recent bleeding.  We will continue to monitor him in inpatient setting, continue clears.  Tomorrow we will advance his diet to soft food, and potentially home if no more melena, no evidence of rebleeding, and hemoglobin remained stable by Saturday.        DVT prophylaxis: SCDs Code Status: FULL Family Communication: Daughter by phone    Consultants:   GI  Procedures:   Endoscopy 4/22  Findings:      One non-bleeding cratered duodenal ulcer with adherent clot was found in  the duodenal bulb. See below for additional description      Patchy mucosal changes characterized by ulceration were found in the       prepyloric region of the stomach.      LA Grade C (one or more mucosal breaks continuous between tops of 2 or       more mucosal folds, less than 75% circumference) esophagitis with no       bleeding was found in the distal esophagus.      The exam was otherwise without  abnormality.      The cardia and gastric fundus were normal on retroflexion.      Biopsies were taken with a cold forceps in the gastric antrum for       Helicobacter pylori testing using CLOtest. Verification of patient       identification for the specimen was done. Estimated blood loss was       minimal. Impression:               - Deep and large Non-bleeding duodenal ulcer with                            adherent clot. Higher-risk of rebleeding - odd                            situation as I can enter duodenum via pylorus or                            this uvler creater so either duodenal ulcer that                            fistulized to stomach or vice versa. Not treated as                            I think attempting endoscopic therapy in this                            seeting - if these adherent clots are non-bleeding                            visble vessels they are large and at higher risk of                            treatment-related bleeding problems.                           - Ulcerated mucosa in the prepyloric region of the                            stomach.                           - LA Grade C reflux esophagitis.                           - The examination was otherwise normal.                           -  Biopsies were taken with a cold forceps for                            Helicobacter pylori testing using CLOtest. Recommendation:           - Clear liquid diet.                           - Continue present medications. Back on PPI                            infusion - may need higher level of care will                            discuss w/ hospitalist. If rebleeds rescoping                            reasonable depending upon what CT tells [place] and                            clinical scenario - could need embolization Tx                            instead.                           - Await pathology results. CLO test                           - Return patient to  hospital ward for ongoing care.                           - Suspect Goody's, Ibuprofen responsible for all of                            this - malignancy in duodenum unlikely - given all                            of his issues will CT chest and abdomen/pelvis to                            better understand process.                           - depending upon clinical course will need f/u EGD                            at least 2 mos down road       CT chest  CT abdomen IMPRESSION: CT demonstrates evidence of pyloric/duodenal ulcer that was described today on endoscopy. No evidence of free air/perforation. There is a single small lymph node within the gastrohepatic ligament, nonspecific, potentially inflammatory/reactive.  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 Referral for oncology evaluation is  recommended.  Increasing plate like soft tissue at the periphery of the right lower lobe, abutting the pleura, in the region of previously treated squamous cell carcinoma. Recurrence cannot be excluded.  Aortic atherosclerosis and associated coronary artery disease. Aortic Atherosclerosis (ICD10-I70.0).  Ancillary findings as above.   Electronically Signed   By: Corrie Mckusick D.O.   On: 04/21/2019 20:28     Antimicrobials:   None    Subjective: No new complaints, no melena, hematochezia, syncope, seizures, passing out, fever, cough, chest pain, palpitations, vomiting or malaise.  Objective: Vitals:   04/22/19 0809 04/22/19 0816 04/22/19 0856 04/22/19 1222  BP: 128/74  (!) 119/54   Pulse: 84  87   Resp: 16  18   Temp: 98.8 F (37.1 C) 98.8 F (37.1 C)  98.6 F (37 C)  TempSrc: Oral Oral  Oral  SpO2: 96%  97%   Weight:      Height:        Intake/Output Summary (Last 24 hours) at 04/22/2019 1333 Last data filed at 04/22/2019  0940 Gross per 24 hour  Intake 1175.76 ml  Output 500 ml  Net 675.76 ml   Filed Weights   04/20/19 0127 04/21/19 0500  Weight: 88.7 kg 90.7 kg    Examination: General appearance: Adult male, lying in bed, pleasant, no acute distress HEENT: Anicteric, conjunctival pink, lids and lashes normal, no nasal deformity, discharge, or epistaxis.  Lips moist, dentures in place, no oral lesions, oropharynx moist, hearing diminished.   Skin: Warm and dry.  No suspicious rashes or lesions. Cardiac: Regular rate and rhythm, no murmurs, JVP normal, no lower extremity edema Respiratory: Normal respiratory rate and rhythm, lungs clear without rales or wheezes  abdomen: Mild epigastric tenderness, no rebound or guarding.  No hepatosplenomegaly. MSK: Normal muscle bulk and tone, no deformities or effusions of the large joints of the upper or lower extremities bilaterally Neuro: Alert, extraocular movements intact, moves all extremities, speech fluent.Marland Kitchen    Psych: Sensorium intact responding to questions, attention normal, affect pleasant, judgment and insight appear moderately impaired by dementia.    Data Reviewed: I have personally reviewed following labs and imaging studies:  CBC: Recent Labs  Lab 04/20/19 0147 04/20/19 0351 04/20/19 1140 04/21/19 0420 04/22/19 0316  WBC 13.9*  --   --  11.5* 10.8*  NEUTROABS 11.6*  --   --   --   --   HGB 7.9* 6.5* 8.6* 7.7* 7.4*  HCT 26.2* 22.4* 26.9* 24.3* 23.6*  MCV 100.4*  --   --  95.7 96.3  PLT 557*  --   --  425* 474*   Basic Metabolic Panel: Recent Labs  Lab 04/20/19 0147 04/20/19 0351 04/20/19 0900 04/21/19 0420 04/22/19 0316  NA 136 140  --  139 139  K 4.5 3.4*  --  4.1 4.0  CL 96* 111  --  106 108  CO2 22 17*  --  21* 22  GLUCOSE 217* 135*  --  104* 89  BUN 39* 34*  --  26* 13  CREATININE 1.97* 1.50*  --  1.34* 1.38*  CALCIUM 8.9 6.7*  --  8.5* 8.4*  MG  --   --  1.4* 2.3  --    GFR: Estimated Creatinine Clearance: 48.6 mL/min  (A) (by C-G formula based on SCr of 1.38 mg/dL (H)). Liver Function Tests: Recent Labs  Lab 04/20/19 0147 04/20/19 0351 04/21/19 0420 04/22/19 0316  AST 19 14* 14* 16  ALT 12 8 11  11  ALKPHOS 73 55 65 69  BILITOT 1.0 0.6 0.9 0.7  PROT 6.8 4.8* 6.0* 6.1*  ALBUMIN 2.8* 2.0* 2.5* 2.5*   No results for input(s): LIPASE, AMYLASE in the last 168 hours. No results for input(s): AMMONIA in the last 168 hours. Coagulation Profile: Recent Labs  Lab 04/20/19 0147  INR 1.4*   Cardiac Enzymes: Recent Labs  Lab 04/20/19 0147  TROPONINI <0.03   BNP (last 3 results) No results for input(s): PROBNP in the last 8760 hours. HbA1C: No results for input(s): HGBA1C in the last 72 hours. CBG: Recent Labs  Lab 04/21/19 1649 04/21/19 2022 04/22/19 0014 04/22/19 0805 04/22/19 1222  GLUCAP 115* 101* 87 82 104*   Lipid Profile: No results for input(s): CHOL, HDL, LDLCALC, TRIG, CHOLHDL, LDLDIRECT in the last 72 hours. Thyroid Function Tests: No results for input(s): TSH, T4TOTAL, FREET4, T3FREE, THYROIDAB in the last 72 hours. Anemia Panel: No results for input(s): VITAMINB12, FOLATE, FERRITIN, TIBC, IRON, RETICCTPCT in the last 72 hours. Urine analysis:    Component Value Date/Time   COLORURINE YELLOW 04/03/2017 2005   APPEARANCEUR CLEAR 04/03/2017 2005   LABSPEC 1.016 04/03/2017 2005   PHURINE 5.0 04/03/2017 2005   GLUCOSEU NEGATIVE 04/03/2017 2005   HGBUR NEGATIVE 04/03/2017 2005   BILIRUBINUR NEGATIVE 04/03/2017 2005   KETONESUR NEGATIVE 04/03/2017 2005   PROTEINUR NEGATIVE 04/03/2017 2005   UROBILINOGEN 1.0 02/22/2012 0238   NITRITE NEGATIVE 04/03/2017 2005   LEUKOCYTESUR NEGATIVE 04/03/2017 2005   Sepsis Labs: @LABRCNTIP (procalcitonin:4,lacticacidven:4)  ) Recent Results (from the past 240 hour(s))  MRSA PCR Screening     Status: None   Collection Time: 04/20/19  5:10 AM  Result Value Ref Range Status   MRSA by PCR NEGATIVE NEGATIVE Final    Comment:        The  GeneXpert MRSA Assay (FDA approved for NASAL specimens only), is one component of a comprehensive MRSA colonization surveillance program. It is not intended to diagnose MRSA infection nor to guide or monitor treatment for MRSA infections. Performed at [facility], [place] [address] [place], [place] [address]          Radiology Studies: Ct Chest W Contrast  Result Date: 04/21/2019 CLINICAL DATA:  73 year old male with a duodenal ulcer EXAM: CT CHEST, ABDOMEN, AND PELVIS WITH CONTRAST TECHNIQUE: Multidetector CT imaging of the chest, abdomen and pelvis was performed following the standard protocol during bolus administration of intravenous contrast. CONTRAST:  168mL OMNIPAQUE IOHEXOL 300 MG/ML  SOLN COMPARISON:  10/04/2016 FINDINGS: CT CHEST FINDINGS Cardiovascular: Cardiomegaly. No pericardial fluid/thickening. Dense calcifications of the left main, left anterior descending, circumflex, right coronary arteries. Normal course caliber and contour of the thoracic aorta. Patent branch vessels. Atherosclerotic changes of the aortic arch and descending thoracic aorta. No aneurysm. Unremarkable visualized pulmonary arteries. Mediastinum/Nodes: Small lymph nodes of the mediastinum. No mediastinal adenopathy. Unremarkable course of the thoracic esophagus. Unremarkable thoracic inlet. Lungs/Pleura: Partially calcified granulomas of the right lung apex, similar to the comparison CT of 04/01/2016 and 10/04/2016. No pneumothorax. No confluent airspace disease. Increasing volume of soft tissue in the lateral aspect of the right lower lobe, with large pleural attachment, largest axial dimension 4.6 cm. This was in the region of the previously biopsied and treated carcinoma. Increased nodularity on the deep margin of this plate like soft tissue. 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 There is a  tail of  tissue that extends to the pleura at the inferior margin at the costophrenic sulcus, as well as the tail that appears to extend to the diaphragm. Atelectasis of the dependent aspects of the left lower lobe. CT ABDOMEN PELVIS FINDINGS Hepatobiliary: Unremarkable liver. Calcified gallstones with no inflammatory changes. Pancreas: Unremarkable pancreas Spleen: Unremarkable spleen Adrenals/Urinary Tract: Unremarkable adrenal glands. Left kidney without hydronephrosis or nephrolithiasis. Unremarkable course of the left ureter. Right kidney demonstrates nonobstructing stones in the superior collecting system, largest measuring 8 mm. Partially septated cyst of the lateral cortex. Unremarkable course of the right ureter. No hydronephrosis or inflammatory changes. Unremarkable urinary bladder. Stomach/Bowel: Stomach decompressed. In the region of the proximal duodenum/pylorus, there is gas and fluid collection involving the wall, which likely corresponds to the ulcer described on the endoscopy report. I do not see a clear fistula to the duodenum that was described, however, duodenum is decompressed. There is no free air or focal fluid. Single small lymph node within the gastrohepatic ligament. Remainder of small bowel unremarkable, decompressed. No focal inflammatory changes. Appendix is not visualized, however, no inflammatory changes are present adjacent to the cecum to indicate an appendicitis. Colon is decompressed with no focal inflammatory changes. Colonic diverticula. Vascular/Lymphatic: Atherosclerotic changes of the abdominal aorta and the bilateral iliac arteries. No retroperitoneal adenopathy. No free fluid. No pelvic adenopathy. No inguinal adenopathy. Reproductive: Transverse diameter of the prostate measures 4.0 cm Other: Fat containing umbilical hernia. Musculoskeletal: No acute displaced fracture. No aggressive lytic lesions or sclerotic lesions. Advanced degenerative changes of the thoracolumbar  spine, with flowing anterior osteophyte production of the thoracolumbar spine. Multilevel degenerative changes through the lumbar spine with vacuum disc phenomenon at L3-L4, L4-L5 levels. IMPRESSION: CT demonstrates evidence of pyloric/duodenal ulcer that was described today on endoscopy. No evidence of free air/perforation. There is a single small lymph node within the gastrohepatic ligament, nonspecific, potentially inflammatory/reactive. 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 Referral for oncology evaluation is recommended. Increasing plate like soft tissue at the periphery of the right lower lobe, abutting the pleura, in the region of previously treated squamous cell carcinoma. Recurrence cannot be excluded. Aortic atherosclerosis and associated coronary artery disease. Aortic Atherosclerosis (ICD10-I70.0). Ancillary findings as above. Electronically Signed   By: Corrie Mckusick D.O.   On: 04/21/2019 20:28   Ct Abdomen Pelvis W Contrast  Result Date: 04/21/2019 CLINICAL DATA:  73 year old male with a duodenal ulcer EXAM: CT CHEST, ABDOMEN, AND PELVIS WITH CONTRAST TECHNIQUE: Multidetector CT imaging of the chest, abdomen and pelvis was performed following the standard protocol during bolus administration of intravenous contrast. CONTRAST:  146mL OMNIPAQUE IOHEXOL 300 MG/ML  SOLN COMPARISON:  10/04/2016 FINDINGS: CT CHEST FINDINGS Cardiovascular: Cardiomegaly. No pericardial fluid/thickening. Dense calcifications of the left main, left anterior descending, circumflex, right coronary arteries. Normal course caliber and contour of the thoracic aorta. Patent branch vessels. Atherosclerotic changes of the aortic arch and descending thoracic aorta. No aneurysm. Unremarkable visualized pulmonary arteries. Mediastinum/Nodes: Small lymph nodes of the mediastinum. No mediastinal  adenopathy. Unremarkable course of the thoracic esophagus. Unremarkable thoracic inlet. Lungs/Pleura: Partially calcified granulomas of the right lung apex, similar to the comparison CT of 04/01/2016 and 10/04/2016. No pneumothorax. No confluent airspace disease. Increasing volume of soft tissue in the lateral aspect of the right lower lobe, with large pleural attachment, largest axial dimension 4.6 cm. This was in the region of the previously biopsied and treated  carcinoma. Increased nodularity on the deep margin of this plate like soft tissue. 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 There is a tail of tissue that extends to the pleura at the inferior margin at the costophrenic sulcus, as well as the tail that appears to extend to the diaphragm. Atelectasis of the dependent aspects of the left lower lobe. CT ABDOMEN PELVIS FINDINGS Hepatobiliary: Unremarkable liver. Calcified gallstones with no inflammatory changes. Pancreas: Unremarkable pancreas Spleen: Unremarkable spleen Adrenals/Urinary Tract: Unremarkable adrenal glands. Left kidney without hydronephrosis or nephrolithiasis. Unremarkable course of the left ureter. Right kidney demonstrates nonobstructing stones in the superior collecting system, largest measuring 8 mm. Partially septated cyst of the lateral cortex. Unremarkable course of the right ureter. No hydronephrosis or inflammatory changes. Unremarkable urinary bladder. Stomach/Bowel: Stomach decompressed. In the region of the proximal duodenum/pylorus, there is gas and fluid collection involving the wall, which likely corresponds to the ulcer described on the endoscopy report. I do not see a clear fistula to the duodenum that was described, however, duodenum is decompressed. There is no free air or focal fluid. Single small lymph node within the gastrohepatic ligament. Remainder of small  bowel unremarkable, decompressed. No focal inflammatory changes. Appendix is not visualized, however, no inflammatory changes are present adjacent to the cecum to indicate an appendicitis. Colon is decompressed with no focal inflammatory changes. Colonic diverticula. Vascular/Lymphatic: Atherosclerotic changes of the abdominal aorta and the bilateral iliac arteries. No retroperitoneal adenopathy. No free fluid. No pelvic adenopathy. No inguinal adenopathy. Reproductive: Transverse diameter of the prostate measures 4.0 cm Other: Fat containing umbilical hernia. Musculoskeletal: No acute displaced fracture. No aggressive lytic lesions or sclerotic lesions. Advanced degenerative changes of the thoracolumbar spine, with flowing anterior osteophyte production of the thoracolumbar spine. Multilevel degenerative changes through the lumbar spine with vacuum disc phenomenon at L3-L4, L4-L5 levels. IMPRESSION: CT demonstrates evidence of pyloric/duodenal ulcer that was described today on endoscopy. No evidence of free air/perforation. There is a single small lymph node within the gastrohepatic ligament, nonspecific, potentially inflammatory/reactive. 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 Referral for oncology evaluation is recommended. Increasing plate like soft tissue at the periphery of the right lower lobe, abutting the pleura, in the region of previously treated squamous cell carcinoma. Recurrence cannot be excluded. Aortic atherosclerosis and associated coronary artery disease. Aortic Atherosclerosis (ICD10-I70.0). Ancillary findings as above. Electronically Signed   By: Corrie Mckusick D.O.   On: 04/21/2019 20:28        Scheduled Meds:  cyanocobalamin  1,000 mcg Subcutaneous Daily   folic acid  1 mg Oral Daily   insulin aspart  0-15 Units Subcutaneous TID WC   insulin aspart   0-5 Units Subcutaneous QHS   mometasone-formoterol  2 puff Inhalation BID   [START ON 04/25/2019] pantoprazole  40 mg Intravenous Q12H   sodium chloride flush  3 mL Intravenous Q12H   Continuous Infusions:  pantoprozole (PROTONIX) infusion 8 mg/hr (04/22/19 0313)     LOS: 2 days    Time spent: 25 minutes    Edwin Dada, MD [facility] 04/22/2019, 1:33 PM     Please page through [place]:  [email or web address] Password [email or web address] If 7PM-7AM, please contact night-coverage

## 2019-04-23 ENCOUNTER — Other Ambulatory Visit: Payer: Self-pay | Admitting: Internal Medicine

## 2019-04-23 DIAGNOSIS — K264 Chronic or unspecified duodenal ulcer with hemorrhage: Secondary | ICD-10-CM

## 2019-04-23 LAB — COMPREHENSIVE METABOLIC PANEL
ALT: 13 U/L (ref 0–44)
AST: 23 U/L (ref 15–41)
Albumin: 2.7 g/dL — ABNORMAL LOW (ref 3.5–5.0)
Alkaline Phosphatase: 70 U/L (ref 38–126)
Anion gap: 10 (ref 5–15)
BUN: 9 mg/dL (ref 8–23)
CO2: 20 mmol/L — ABNORMAL LOW (ref 22–32)
Calcium: 8.6 mg/dL — ABNORMAL LOW (ref 8.9–10.3)
Chloride: 109 mmol/L (ref 98–111)
Creatinine, Ser: 1.15 mg/dL (ref 0.61–1.24)
GFR calc Af Amer: >60 mL/min (ref >60)
GFR calc non Af Amer: >60 mL/min (ref >60)
Glucose, Bld: 95 mg/dL (ref 70–99)
Potassium: 4.2 mmol/L (ref 3.5–5.1)
Sodium: 139 mmol/L (ref 135–145)
Total Bilirubin: 0.9 mg/dL (ref 0.3–1.2)
Total Protein: 6.1 g/dL — ABNORMAL LOW (ref 6.5–8.1)

## 2019-04-23 LAB — CBC
HCT: 26.8 % — ABNORMAL LOW (ref 39.0–52.0)
Hemoglobin: 8 g/dL — ABNORMAL LOW (ref 13.0–17.0)
MCH: 29.4 pg (ref 26.0–34.0)
MCHC: 29.9 g/dL — ABNORMAL LOW (ref 30.0–36.0)
MCV: 98.5 fL (ref 80.0–100.0)
Platelets: 398 10*3/uL (ref 150–400)
RBC: 2.72 MIL/uL — ABNORMAL LOW (ref 4.22–5.81)
RDW: 17.7 % — ABNORMAL HIGH (ref 11.5–15.5)
WBC: 10.7 10*3/uL — ABNORMAL HIGH (ref 4.0–10.5)
nRBC: 0.4 % — ABNORMAL HIGH (ref 0.0–0.2)

## 2019-04-23 LAB — GLUCOSE, CAPILLARY
Glucose-Capillary: 91 mg/dL (ref 70–99)
Glucose-Capillary: 102 mg/dL — ABNORMAL HIGH (ref 70–99)
Glucose-Capillary: 120 mg/dL — ABNORMAL HIGH (ref 70–99)
Glucose-Capillary: 133 mg/dL — ABNORMAL HIGH (ref 70–99)

## 2019-04-23 MED ORDER — PANTOPRAZOLE SODIUM 40 MG PO TBEC
40.0000 mg | DELAYED_RELEASE_TABLET | Freq: Two times a day (BID) | ORAL | Status: DC
  Administered 2019-04-23 – 2019-04-24 (×2): 40 mg via ORAL
  Filled 2019-04-23 (×2): qty 1

## 2019-04-23 MED ORDER — CLARITHROMYCIN 500 MG PO TABS
500.0000 mg | ORAL_TABLET | Freq: Two times a day (BID) | ORAL | Status: DC
  Administered 2019-04-23 – 2019-04-24 (×3): 500 mg via ORAL
  Filled 2019-04-23 (×3): qty 1

## 2019-04-23 MED ORDER — AMOXICILLIN 500 MG PO CAPS
1000.0000 mg | ORAL_CAPSULE | Freq: Two times a day (BID) | ORAL | Status: DC
  Administered 2019-04-23 – 2019-04-24 (×3): 1000 mg via ORAL
  Filled 2019-04-23 (×3): qty 2

## 2019-04-23 NOTE — Progress Notes (Signed)
PROGRESS NOTE    John [name]  IPJ:[id number] DOB: Oct 09, 1946 DOA: 04/20/2019 PCP: Patient, No Pcp Per      Brief Narrative:  John [name] is a 73 y.o. M with HTN, dCHF, CAD, COPD on home O2, and hx remote NSCLC s/p radiation who presented with syncope.  Patient recently admitted for symptomatic anemia.  Transfused 2 units, discharged with plans in place for outpatient endoscopy.    On day of admission, stood up suddenly, became dizzy and passed out.  In ER, Hgb stable from previous, although he noted melena since discharge.  FOBT+.  GI consulted and admitted for EGD.      Assessment & Plan:  Acute on chronic anemia Macrocytic anemia Possible GI bleeding No melena, no hematochezia, hemoglobin stable, urease positive. -Start clarithromycin and amoxicillin -Continue PPI, transition to oral -Consult GI, appreciate cares -Continue folate and B12 -Advance diet as tolerated   Syncope This occurred in the context of orthostasis in the setting of GI bleeding and hypovolemia.  It is resolved, no further cardiogenic syncope work-up recommended.   Chronic kidney disease stage III Stable  Lung mass History of NSCLC CT chest shows "new/enlarging irregular mass in right lower lobe, 4.8 cm inferior to the previously treated SCC" as well as "increasing volume of soft tissue with large pleural attachment, in the region of previously biopsied and treated carcinoma, 4.6 cm"  I conversed with Dr. Earlie Server about this electronically today. -Patient has follow-up with oncology on May 6  COPD Chronic hypoxic respiratory failure -Continue home oxygen -Continue ICS/LABA -Continue as needed bronchodilator  Diabetes Glucoses well controlled -Continue sliding scale correction insulin  Dementia I have asked social work to provide information about local PACE program to the family           MDM and disposition: The below labs and imaging reports reviewed and summarized above.   Medication management as above.  Patient was admitted with syncope from hypovolemia and acute on chronic blood loss anemia.  He was transfused 1 unit, went to endoscopy 4/22, and was found to have a large duodenal ulcer with stigmata of recent bleeding.  He has been monitored on IV PPI, will advance diet today, possibly home tomorrow.        DVT prophylaxis: SCDs Code Status: FULL Family Communication: Daughter by phone    Consultants:   GI  Procedures:   Endoscopy 4/22  Findings:      One non-bleeding cratered duodenal ulcer with adherent clot was found in       the duodenal bulb. See below for additional description      Patchy mucosal changes characterized by ulceration were found in the       prepyloric region of the stomach.      LA Grade C (one or more mucosal breaks continuous between tops of 2 or       more mucosal folds, less than 75% circumference) esophagitis with no       bleeding was found in the distal esophagus.      The exam was otherwise without abnormality.      The cardia and gastric fundus were normal on retroflexion.      Biopsies were taken with a cold forceps in the gastric antrum for       Helicobacter pylori testing using CLOtest. Verification of patient       identification for the specimen was done. Estimated blood loss was       minimal. Impression:               -  Deep and large Non-bleeding duodenal ulcer with                            adherent clot. Higher-risk of rebleeding - odd                            situation as I can enter duodenum via pylorus or                            this uvler creater so either duodenal ulcer that                            fistulized to stomach or vice versa. Not treated as                            I think attempting endoscopic therapy in this                            seeting - if these adherent clots are non-bleeding                            visble vessels they are large and at higher risk of                             treatment-related bleeding problems.                           - Ulcerated mucosa in the prepyloric region of the                            stomach.                           - LA Grade C reflux esophagitis.                           - The examination was otherwise normal.                           - Biopsies were taken with a cold forceps for                            Helicobacter pylori testing using CLOtest. Recommendation:           - Clear liquid diet.                           - Continue present medications. Back on PPI                            infusion - may need higher level of care will                            discuss w/ hospitalist. If rebleeds rescoping  reasonable depending upon what CT tells [place] and                            clinical scenario - could need embolization Tx                            instead.                           - Await pathology results. CLO test                           - Return patient to hospital ward for ongoing care.                           - Suspect Goody's, Ibuprofen responsible for all of                            this - malignancy in duodenum unlikely - given all                            of his issues will CT chest and abdomen/pelvis to                            better understand process.                           - depending upon clinical course will need f/u EGD                            at least 2 mos down road       CT chest  CT abdomen IMPRESSION: CT demonstrates evidence of pyloric/duodenal ulcer that was described today on endoscopy. No evidence of free air/perforation. There is a single small lymph node within the gastrohepatic ligament, nonspecific, potentially inflammatory/reactive.  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 Referral for  oncology evaluation is recommended.  Increasing plate like soft tissue at the periphery of the right lower lobe, abutting the pleura, in the region of previously treated squamous cell carcinoma. Recurrence cannot be excluded.  Aortic atherosclerosis and associated coronary artery disease. Aortic Atherosclerosis (ICD10-I70.0).  Ancillary findings as above.   Electronically Signed   By: Corrie Mckusick D.O.   On: 04/21/2019 20:28     Antimicrobials:   None    Subjective: No syncope, passing out, seizures, fever, cough, chest pain, palpitations, vomiting, melena, hematochezia, melena.    Objective: Vitals:   04/23/19 0800 04/23/19 0816 04/23/19 1058 04/23/19 1310  BP:   (!) 149/71 (!) 110/96  Pulse:   90   Resp:   17 15  Temp: 98 F (36.7 C)  98.4 F (36.9 C) 98.3 F (36.8 C)  TempSrc: Oral  Oral Oral  SpO2:  90% 95% 94%  Weight:      Height:        Intake/Output Summary (Last 24 hours) at 04/23/2019 1618 Last data filed at 04/23/2019 1235 Gross per 24 hour  Intake 1016.25 ml  Output 400 ml  Net 616.25 ml   Filed Weights   04/20/19 0127 04/21/19 0500 04/23/19 0542  Weight: 88.7 kg 90.7 kg 86.5 kg    Examination: General appearance: Adult male, pleasant, lying in bed, no acute distress  HEENT: Anicteric, conjunctival pink, lids and lashes normal, no nasal deformity, discharge, or epistaxis.  Lips moist, dentures in place, no oral lesions, oropharynx moist, hearing diminished.   Skin: Warm and dry.  No suspicious rashes or lesions. Cardiac: Regular rate and rhythm, no murmurs, JVP normal, no lower extremity edema Respiratory: Normal respiratory rate and rhythm, lungs clear without rales or wheezes abdomen: Epigastric tenderness, no rebound or guarding, no hepatosplenomegaly MSK: Normal muscle bulk and tone, no warmness or effusions of the large joints of the upper lower extremities bilaterally Neuro: Alert and oriented, extraocular movements intact, moves  all extremities with normal strength and coordination, speech fluent.    Psych: Sensorium intact responding to questions, intention normal, affect pleasant, judgment and insight appear somewhat impaired by dementia    Data Reviewed: I have personally reviewed following labs and imaging studies:  CBC: Recent Labs  Lab 04/20/19 0147 04/20/19 0351 04/20/19 1140 04/21/19 0420 04/22/19 0316 04/23/19 0227  WBC 13.9*  --   --  11.5* 10.8* 10.7*  NEUTROABS 11.6*  --   --   --   --   --   HGB 7.9* 6.5* 8.6* 7.7* 7.4* 8.0*  HCT 26.2* 22.4* 26.9* 24.3* 23.6* 26.8*  MCV 100.4*  --   --  95.7 96.3 98.5  PLT 557*  --   --  425* 421* 102   Basic Metabolic Panel: Recent Labs  Lab 04/20/19 0147 04/20/19 0351 04/20/19 0900 04/21/19 0420 04/22/19 0316 04/23/19 0227  NA 136 140  --  139 139 139  K 4.5 3.4*  --  4.1 4.0 4.2  CL 96* 111  --  106 108 109  CO2 22 17*  --  21* 22 20*  GLUCOSE 217* 135*  --  104* 89 95  BUN 39* 34*  --  26* 13 9  CREATININE 1.97* 1.50*  --  1.34* 1.38* 1.15  CALCIUM 8.9 6.7*  --  8.5* 8.4* 8.6*  MG  --   --  1.4* 2.3  --   --    GFR: Estimated Creatinine Clearance: 57 mL/min (by C-G formula based on SCr of 1.15 mg/dL). Liver Function Tests: Recent Labs  Lab 04/20/19 0147 04/20/19 0351 04/21/19 0420 04/22/19 0316 04/23/19 0227  AST 19 14* 14* 16 23  ALT 12 8 11 11 13   ALKPHOS 73 55 65 69 70  BILITOT 1.0 0.6 0.9 0.7 0.9  PROT 6.8 4.8* 6.0* 6.1* 6.1*  ALBUMIN 2.8* 2.0* 2.5* 2.5* 2.7*   No results for input(s): LIPASE, AMYLASE in the last 168 hours. No results for input(s): AMMONIA in the last 168 hours. Coagulation Profile: Recent Labs  Lab 04/20/19 0147  INR 1.4*   Cardiac Enzymes: Recent Labs  Lab 04/20/19 0147  TROPONINI <0.03   BNP (last 3 results) No results for input(s): PROBNP in the last 8760 hours. HbA1C: No results for input(s): HGBA1C in the last 72 hours. CBG: Recent Labs  Lab 04/22/19 1222 04/22/19 1643 04/22/19 2058  04/23/19 0746 04/23/19 1154  GLUCAP 104* 91 96 91 102*   Lipid Profile: No results for input(s): CHOL, HDL, LDLCALC, TRIG, CHOLHDL, LDLDIRECT in the last 72 hours. Thyroid Function Tests: No results for input(s): TSH, T4TOTAL, FREET4,  T3FREE, THYROIDAB in the last 72 hours. Anemia Panel: No results for input(s): VITAMINB12, FOLATE, FERRITIN, TIBC, IRON, RETICCTPCT in the last 72 hours. Urine analysis:    Component Value Date/Time   COLORURINE YELLOW 04/03/2017 2005   APPEARANCEUR CLEAR 04/03/2017 2005   LABSPEC 1.016 04/03/2017 2005   PHURINE 5.0 04/03/2017 2005   GLUCOSEU NEGATIVE 04/03/2017 2005   HGBUR NEGATIVE 04/03/2017 2005   BILIRUBINUR NEGATIVE 04/03/2017 2005   KETONESUR NEGATIVE 04/03/2017 2005   PROTEINUR NEGATIVE 04/03/2017 2005   UROBILINOGEN 1.0 02/22/2012 0238   NITRITE NEGATIVE 04/03/2017 2005   LEUKOCYTESUR NEGATIVE 04/03/2017 2005   Sepsis Labs: @LABRCNTIP (procalcitonin:4,lacticacidven:4)  ) Recent Results (from the past 240 hour(s))  MRSA PCR Screening     Status: None   Collection Time: 04/20/19  5:10 AM  Result Value Ref Range Status   MRSA by PCR NEGATIVE NEGATIVE Final    Comment:        The GeneXpert MRSA Assay (FDA approved for NASAL specimens only), is one component of a comprehensive MRSA colonization surveillance program. It is not intended to diagnose MRSA infection nor to guide or monitor treatment for MRSA infections. Performed at [facility], [place] [address], [place], [place] [address]          Radiology Studies: Ct Chest W Contrast  Result Date: 04/21/2019 CLINICAL DATA:  73 year old male with a duodenal ulcer EXAM: CT CHEST, ABDOMEN, AND PELVIS WITH CONTRAST TECHNIQUE: Multidetector CT imaging of the chest, abdomen and pelvis was performed following the standard protocol during bolus administration of intravenous contrast. CONTRAST:  162mL OMNIPAQUE IOHEXOL 300 MG/ML  SOLN COMPARISON:  10/04/2016 FINDINGS: CT CHEST  FINDINGS Cardiovascular: Cardiomegaly. No pericardial fluid/thickening. Dense calcifications of the left main, left anterior descending, circumflex, right coronary arteries. Normal course caliber and contour of the thoracic aorta. Patent branch vessels. Atherosclerotic changes of the aortic arch and descending thoracic aorta. No aneurysm. Unremarkable visualized pulmonary arteries. Mediastinum/Nodes: Small lymph nodes of the mediastinum. No mediastinal adenopathy. Unremarkable course of the thoracic esophagus. Unremarkable thoracic inlet. Lungs/Pleura: Partially calcified granulomas of the right lung apex, similar to the comparison CT of 04/01/2016 and 10/04/2016. No pneumothorax. No confluent airspace disease. Increasing volume of soft tissue in the lateral aspect of the right lower lobe, with large pleural attachment, largest axial dimension 4.6 cm. This was in the region of the previously biopsied and treated carcinoma. Increased nodularity on the deep margin of this plate like soft tissue. 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 There is a tail of tissue that extends to the pleura at the inferior margin at the costophrenic sulcus, as well as the tail that appears to extend to the diaphragm. Atelectasis of the dependent aspects of the left lower lobe. CT ABDOMEN PELVIS FINDINGS Hepatobiliary: Unremarkable liver. Calcified gallstones with no inflammatory changes. Pancreas: Unremarkable pancreas Spleen: Unremarkable spleen Adrenals/Urinary Tract: Unremarkable adrenal glands. Left kidney without hydronephrosis or nephrolithiasis. Unremarkable course of the left ureter. Right kidney demonstrates nonobstructing stones in the superior collecting system, largest measuring 8 mm. Partially septated cyst of the lateral cortex. Unremarkable course of the right ureter. No hydronephrosis or inflammatory changes.  Unremarkable urinary bladder. Stomach/Bowel: Stomach decompressed. In the region of the proximal duodenum/pylorus, there is gas and fluid collection involving the wall, which likely corresponds to the ulcer described on the endoscopy report. I do not see a clear fistula to the duodenum that  was described, however, duodenum is decompressed. There is no free air or focal fluid. Single small lymph node within the gastrohepatic ligament. Remainder of small bowel unremarkable, decompressed. No focal inflammatory changes. Appendix is not visualized, however, no inflammatory changes are present adjacent to the cecum to indicate an appendicitis. Colon is decompressed with no focal inflammatory changes. Colonic diverticula. Vascular/Lymphatic: Atherosclerotic changes of the abdominal aorta and the bilateral iliac arteries. No retroperitoneal adenopathy. No free fluid. No pelvic adenopathy. No inguinal adenopathy. Reproductive: Transverse diameter of the prostate measures 4.0 cm Other: Fat containing umbilical hernia. Musculoskeletal: No acute displaced fracture. No aggressive lytic lesions or sclerotic lesions. Advanced degenerative changes of the thoracolumbar spine, with flowing anterior osteophyte production of the thoracolumbar spine. Multilevel degenerative changes through the lumbar spine with vacuum disc phenomenon at L3-L4, L4-L5 levels. IMPRESSION: CT demonstrates evidence of pyloric/duodenal ulcer that was described today on endoscopy. No evidence of free air/perforation. There is a single small lymph node within the gastrohepatic ligament, nonspecific, potentially inflammatory/reactive. 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 Referral for oncology evaluation is recommended. Increasing plate like soft tissue at the periphery of the right lower lobe, abutting the pleura, in the region  of previously treated squamous cell carcinoma. Recurrence cannot be excluded. Aortic atherosclerosis and associated coronary artery disease. Aortic Atherosclerosis (ICD10-I70.0). Ancillary findings as above. Electronically Signed   By: Corrie Mckusick D.O.   On: 04/21/2019 20:28   Ct Abdomen Pelvis W Contrast  Result Date: 04/21/2019 CLINICAL DATA:  73 year old male with a duodenal ulcer EXAM: CT CHEST, ABDOMEN, AND PELVIS WITH CONTRAST TECHNIQUE: Multidetector CT imaging of the chest, abdomen and pelvis was performed following the standard protocol during bolus administration of intravenous contrast. CONTRAST:  165mL OMNIPAQUE IOHEXOL 300 MG/ML  SOLN COMPARISON:  10/04/2016 FINDINGS: CT CHEST FINDINGS Cardiovascular: Cardiomegaly. No pericardial fluid/thickening. Dense calcifications of the left main, left anterior descending, circumflex, right coronary arteries. Normal course caliber and contour of the thoracic aorta. Patent branch vessels. Atherosclerotic changes of the aortic arch and descending thoracic aorta. No aneurysm. Unremarkable visualized pulmonary arteries. Mediastinum/Nodes: Small lymph nodes of the mediastinum. No mediastinal adenopathy. Unremarkable course of the thoracic esophagus. Unremarkable thoracic inlet. Lungs/Pleura: Partially calcified granulomas of the right lung apex, similar to the comparison CT of 04/01/2016 and 10/04/2016. No pneumothorax. No confluent airspace disease. Increasing volume of soft tissue in the lateral aspect of the right lower lobe, with large pleural attachment, largest axial dimension 4.6 cm. This was in the region of the previously biopsied and treated carcinoma. Increased nodularity on the deep margin of this plate like soft tissue. 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 There is a tail of tissue that extends to the pleura at the inferior margin at  the costophrenic sulcus, as well as the tail that appears to extend to the diaphragm. Atelectasis of the dependent aspects of the left lower lobe. CT ABDOMEN PELVIS FINDINGS Hepatobiliary: Unremarkable liver. Calcified gallstones with no inflammatory changes. Pancreas: Unremarkable pancreas Spleen: Unremarkable spleen Adrenals/Urinary Tract: Unremarkable adrenal glands. Left kidney without hydronephrosis or nephrolithiasis. Unremarkable course of the left ureter. Right kidney demonstrates nonobstructing stones in the superior collecting system, largest measuring 8 mm. Partially septated cyst of the lateral cortex. Unremarkable course of the right ureter. No hydronephrosis or inflammatory changes. Unremarkable urinary  bladder. Stomach/Bowel: Stomach decompressed. In the region of the proximal duodenum/pylorus, there is gas and fluid collection involving the wall, which likely corresponds to the ulcer described on the endoscopy report. I do not see a clear fistula to the duodenum that was described, however, duodenum is decompressed. There is no free air or focal fluid. Single small lymph node within the gastrohepatic ligament. Remainder of small bowel unremarkable, decompressed. No focal inflammatory changes. Appendix is not visualized, however, no inflammatory changes are present adjacent to the cecum to indicate an appendicitis. Colon is decompressed with no focal inflammatory changes. Colonic diverticula. Vascular/Lymphatic: Atherosclerotic changes of the abdominal aorta and the bilateral iliac arteries. No retroperitoneal adenopathy. No free fluid. No pelvic adenopathy. No inguinal adenopathy. Reproductive: Transverse diameter of the prostate measures 4.0 cm Other: Fat containing umbilical hernia. Musculoskeletal: No acute displaced fracture. No aggressive lytic lesions or sclerotic lesions. Advanced degenerative changes of the thoracolumbar spine, with flowing anterior osteophyte production of the thoracolumbar  spine. Multilevel degenerative changes through the lumbar spine with vacuum disc phenomenon at L3-L4, L4-L5 levels. IMPRESSION: CT demonstrates evidence of pyloric/duodenal ulcer that was described today on endoscopy. No evidence of free air/perforation. There is a single small lymph node within the gastrohepatic ligament, nonspecific, potentially inflammatory/reactive. 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 Referral for oncology evaluation is recommended. Increasing plate like soft tissue at the periphery of the right lower lobe, abutting the pleura, in the region of previously treated squamous cell carcinoma. Recurrence cannot be excluded. Aortic atherosclerosis and associated coronary artery disease. Aortic Atherosclerosis (ICD10-I70.0). Ancillary findings as above. Electronically Signed   By: Corrie Mckusick D.O.   On: 04/21/2019 20:28        Scheduled Meds:  amoxicillin  1,000 mg Oral Q12H   clarithromycin  500 mg Oral Q12H   cyanocobalamin  1,000 mcg Subcutaneous Daily   folic acid  1 mg Oral Daily   insulin aspart  0-15 Units Subcutaneous TID WC   insulin aspart  0-5 Units Subcutaneous QHS   mometasone-formoterol  2 puff Inhalation BID   pantoprazole  40 mg Oral BID AC   sodium chloride flush  3 mL Intravenous Q12H   Continuous Infusions:    LOS: 3 days    Time spent: 25 minutes    Edwin Dada, MD [facility] 04/23/2019, 4:18 PM     Please page through Trainer:  [email or web address] Password [email or web address] If 7PM-7AM, please contact night-coverage

## 2019-04-23 NOTE — Progress Notes (Addendum)
   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 Silverio Decamp - will be for 5/29 10 AM Also want him to do [facility] on Tuesday 4/28 (in DC notes) Plan to see tomorrow but if dc criteria met not necessary for [place] to see before dc  Thanks  Gatha Mayer, MD, [facility] 04/23/2019 10:57 AM Pager [phone]

## 2019-04-23 NOTE — Care Management Important Message (Signed)
Important Message  Patient Details  Name: John [name] MRN: [id number] Date of Birth: June 08, 1946   Medicare Important Message Given:  Yes    Orbie Pyo 04/23/2019, 2:15 PM

## 2019-04-24 DIAGNOSIS — K259 Gastric ulcer, unspecified as acute or chronic, without hemorrhage or perforation: Secondary | ICD-10-CM

## 2019-04-24 DIAGNOSIS — B9681 Helicobacter pylori [H. pylori] as the cause of diseases classified elsewhere: Secondary | ICD-10-CM

## 2019-04-24 DIAGNOSIS — K2970 Gastritis, unspecified, without bleeding: Secondary | ICD-10-CM

## 2019-04-24 DIAGNOSIS — K209 Esophagitis, unspecified: Secondary | ICD-10-CM

## 2019-04-24 DIAGNOSIS — K264 Chronic or unspecified duodenal ulcer with hemorrhage: Secondary | ICD-10-CM

## 2019-04-24 LAB — COMPREHENSIVE METABOLIC PANEL
ALT: 12 U/L (ref 0–44)
AST: 16 U/L (ref 15–41)
Albumin: 2.6 g/dL — ABNORMAL LOW (ref 3.5–5.0)
Alkaline Phosphatase: 71 U/L (ref 38–126)
Anion gap: 11 (ref 5–15)
BUN: 8 mg/dL (ref 8–23)
CO2: 22 mmol/L (ref 22–32)
Calcium: 8.7 mg/dL — ABNORMAL LOW (ref 8.9–10.3)
Chloride: 106 mmol/L (ref 98–111)
Creatinine, Ser: 1.22 mg/dL (ref 0.61–1.24)
GFR calc Af Amer: >60 mL/min (ref >60)
GFR calc non Af Amer: 58 mL/min — ABNORMAL LOW (ref >60)
Glucose, Bld: 104 mg/dL — ABNORMAL HIGH (ref 70–99)
Potassium: 3.5 mmol/L (ref 3.5–5.1)
Sodium: 139 mmol/L (ref 135–145)
Total Bilirubin: 0.8 mg/dL (ref 0.3–1.2)
Total Protein: 6.3 g/dL — ABNORMAL LOW (ref 6.5–8.1)

## 2019-04-24 LAB — CBC
HCT: 25.6 % — ABNORMAL LOW (ref 39.0–52.0)
Hemoglobin: 7.9 g/dL — ABNORMAL LOW (ref 13.0–17.0)
MCH: 29.7 pg (ref 26.0–34.0)
MCHC: 30.9 g/dL (ref 30.0–36.0)
MCV: 96.2 fL (ref 80.0–100.0)
Platelets: 377 10*3/uL (ref 150–400)
RBC: 2.66 MIL/uL — ABNORMAL LOW (ref 4.22–5.81)
RDW: 17 % — ABNORMAL HIGH (ref 11.5–15.5)
WBC: 9.3 10*3/uL (ref 4.0–10.5)
nRBC: 0.3 % — ABNORMAL HIGH (ref 0.0–0.2)

## 2019-04-24 LAB — GLUCOSE, CAPILLARY: Glucose-Capillary: 116 mg/dL — ABNORMAL HIGH (ref 70–99)

## 2019-04-24 MED ORDER — PANTOPRAZOLE SODIUM 40 MG PO TBEC: 40.0000 mg | DELAYED_RELEASE_TABLET | Freq: Two times a day (BID) | ORAL | 0 refills | Status: DC

## 2019-04-24 MED ORDER — CLARITHROMYCIN 500 MG PO TABS: 500.0000 mg | ORAL_TABLET | Freq: Two times a day (BID) | ORAL | 0 refills | Status: AC

## 2019-04-24 MED ORDER — AMOXICILLIN 500 MG PO CAPS: 1000.0000 mg | ORAL_CAPSULE | Freq: Two times a day (BID) | ORAL | 0 refills | Status: AC

## 2019-04-24 NOTE — Plan of Care (Signed)
  Problem: Bowel/Gastric: Goal: Will show no signs and symptoms of gastrointestinal bleeding Outcome: Progressing Note:  POC reviewed with pt.

## 2019-04-24 NOTE — Progress Notes (Signed)
Physical Therapy Treatment Patient Details Name: John [name] MRN: [id number] DOB: 11/29/1946 Today's Date: 04/24/2019    History of Present Illness Mr. Ace is a 73 y.o. male with HTN, dCHF, CAD, COPD on home O2, and hx remote NSCLC s/p radiation, admitted 04/20/19 with syncope in the setting of GI bleeding and hypovolemia. Of note, patient recently admitted for symptomatic anemia with plans for outpatient endoscopy.    PT Comments    Pt progressing well with mobility. Ambulating without device at supervision-level. SpO2 90-95% on RA with activity. Pt planning for discharge home this afternoon. Will have necessary assist from family. Pt declined follow-up with PT services; educ on importance of continued mobility upon return home. If to remain admitted, will follow acutely.    Follow Up Recommendations  No PT follow up;Supervision - Intermittent     Equipment Recommendations  None recommended by PT    Recommendations for Other Services       Precautions / Restrictions Precautions Precautions: Fall Restrictions Weight Bearing Restrictions: No    Mobility  Bed Mobility Overal bed mobility: Independent                Transfers Overall transfer level: Independent Equipment used: None Transfers: Sit to/from Stand              Ambulation/Gait Ambulation/Gait assistance: Supervision Gait Distance (Feet): 150 Feet Assistive device: None Gait Pattern/deviations: Step-through pattern;Decreased stride length   Gait velocity interpretation: 1.31 - 2.62 ft/sec, indicative of limited community ambulator General Gait Details: Slow, steady gait without DME; supervision for safety. SpO2 down to 90% on RA. Declined further distance due to fatigue and preparing for d/c   Stairs Stairs: (Pt declined)           Wheelchair Mobility    Modified Rankin (Stroke Patients Only)       Balance Overall balance assessment: Needs assistance   Sitting balance-Leahy  Scale: Good       Standing balance-Leahy Scale: Good               High level balance activites: Direction changes;Turns;Sudden stops;Head turns High Level Balance Comments: No overt instability or LOB with these higher level balance activities            Cognition Arousal/Alertness: Awake/alert Behavior During Therapy: WFL for tasks assessed/performed Overall Cognitive Status: Within Functional Limits for tasks assessed                                        Exercises      General Comments        Pertinent Vitals/Pain Pain Assessment: No/denies pain    Home Living                      Prior Function            PT Goals (current goals can now be found in the care plan section) Acute Rehab PT Goals Patient Stated Goal: home PT Goal Formulation: With patient Time For Goal Achievement: 05/05/19 Potential to Achieve Goals: Good Progress towards PT goals: Progressing toward goals    Frequency    Min 3X/week      PT Plan Current plan remains appropriate    Co-evaluation              AM-PAC PT "6 Clicks" Mobility   Outcome Measure  Help needed turning  from your back to your side while in a flat bed without using bedrails?: None Help needed moving from lying on your back to sitting on the side of a flat bed without using bedrails?: None Help needed moving to and from a bed to a chair (including a wheelchair)?: None Help needed standing up from a chair using your arms (e.g., wheelchair or bedside chair)?: None   Help needed climbing 3-5 steps with a railing? : A Little 6 Click Score: 19    End of Session   Activity Tolerance: Patient tolerated treatment well Patient left: (seated EOB) Nurse Communication: Mobility status PT Visit Diagnosis: Unsteadiness on feet (R26.81)     Time: 6812-7517 PT Time Calculation (min) (ACUTE ONLY): 11 min  Charges:  $Gait Training: 8-22 mins                    Mabeline Caras, PT,  DPT Acute Rehabilitation Services  Pager [phone] Office [phone]  Derry Lory 04/24/2019, 10:55 AM

## 2019-04-24 NOTE — Care Management (Signed)
Spoke w patient over the phone and he states that he has a ballpark that he plans to walk around by his house and he has again declined [facility] services.

## 2019-04-24 NOTE — Discharge Summary (Signed)
Physician Discharge Summary  John [name] GEX:[id number] DOB: October 13, 1946 DOA: 04/20/2019  PCP: Patient, No Pcp Per  Admit date: 04/20/2019 Discharge date: 04/24/2019  Admitted From: Home  Disposition:  Home with [facility]   Recommendations for Outpatient Follow-up:  1. Follow up with Oncology in 1 week 2. Obtain CBC in 5 days 3. Please follow up biopsy results    Home Health: Yes  Equipment/Devices: None  Discharge Condition: Good  CODE STATUS: FULL Diet recommendation: Regular  Brief/Interim Summary: John [name] is a 73 y.o. M with HTN, dCHF, CAD, COPD on home O2, and hx remote NSCLC s/p radiation who presented with syncope.  Patient recently admitted for symptomatic anemia.  Transfused 2 units, discharged with plans in place for outpatient endoscopy.    On day of admission, stood up suddenly, became dizzy and passed out.  In ER, Hgb stable from previous, although he noted melena since discharge.  FOBT+.  GI consulted and admitted for EGD.     PRINCIPAL HOSPITAL DIAGNOSIS: Acute upper GI bleed    Discharge Diagnoses:   Acute on chronic anemia Macrocytic anemia Acute bleeding peptic or duodenal ulcer Gastroduodenal fistula/double pylorus Patient admitted with history of taking BC Powders, melena, and recurrent acute anemia.  Transfused 1 unit.  Underwent endoscopy by Dr. Carlean Purl that showed large ulcer, probably duodenal fistula to the stomach (although possibly peptic fistula to the duodenum) with adherent clot in the duodenum.    He was placed in stepdown, treated with IV PPI for 72 hours.  He had no further melena, no further drop in Hgb.  Urease positive, so started on clarithromycin and amoxicillin in addition to PPI.  Folate and B12 continued for deficiency and macrocytosis.  Tolerated a soft diet well.  Has GI follow up for labs in 5 days, follow up in office in 1 month.    Syncope This occurred in the context of orthostasis in the setting of GI bleeding and  hypovolemia.  It is resolved, no further cardiogenic syncope work-up recommended.   Chronic kidney disease stage III Stable  Lung mass History of NSCLC CXR showed an incidental right lower lobe mass.  CT chest obtained that showed "new/enlarging irregular mass in right lower lobe, 4.8 cm inferior to the previously treated SCC" as well as "increasing volume of soft tissue with large pleural attachment, in the region of previously biopsied and treated carcinoma, 4.6 cm"  I conversed with Dr. Earlie Server about this electronically, and he has follow up arranged on May 6, Daughter aware.  COPD Chronic hypoxic respiratory failure No active disease  Diabetes Glucoses well controlled  Dementia             Discharge Instructions  Discharge Instructions    Diet - low sodium heart healthy   Complete by:  As directed    Discharge instructions   Complete by:  As directed    From Dr. Loleta Books: You were admitted for a bleeding ulcer.  This ulcer was caused by two things: (1) NSAIDs like naproxen, Aleve, BC powders, Goody's powders and (2) an infection in the stomach called H pylori  First of all, it is important to avoid anything that will further irritate the stomach and drill an ulcer in the stomach.  This means that you should forever avoid all NSAID pain relievers.  All over the counter pain medicines or cold medicines that contain NSAIDs will be labeled.  For information, some examples of NSAIDs are those listed above, aspirin, ibuprofen, Advil, Motrin, Aleve,  naproxen, BC powders, Goody's powders, etc.    SECONDLY, we have to get rid of the infection called H Pylori, so that it can't cause another ulcer. To do this, take antibiotics for 2 months, amoxicillin and clarithromycin Take amoxicillin 1000 mg (two caps) twice daily for 13 more days Take clarithromycin 500 mg (one cap) twice daily for 13 more days then stop IMPORTANT ** DO NOT TAKE YOUR GOUT MEDICINE COLCHICINE  UNTIL YOU ARE DONE TAKING CLARITHROMYCIN.  These medicines interact, and they are dangerous to take together.  Resume your colchicine AFTER you are done taking clarithromycin   Lastly, you need to suppress stomach acid with pantoprazole/Protonix 40 mg twice daily  And Follow up with Dr. Carlean Purl in his office    For your blood pressure: For the next week, stop taking clonidine, but resume all your other home medicines. Call your primary care doctor for a follow up appointment, and have them check your blood pressure and tell you if you should restart clonidine   With regard to your lung mass, you should go see Dr. Julien Nordmann.  He has scheduled an appointment for you on May 6th.  Listed on this sheet.    If you are interested in the PACE program we talked about, it is [phone] or go to [email or web address]   Increase activity slowly   Complete by:  As directed      Allergies as of 04/24/2019      Reactions   Levaquin [levofloxacin In D5w]    Pt states he is not allergic to medication, last took it about 8 years ago   Lisinopril Swelling      Medication List    STOP taking these medications   cloNIDine 0.1 MG tablet Commonly known as:  CATAPRES   colchicine 0.6 MG tablet   naproxen 500 MG tablet Commonly known as:  NAPROSYN     TAKE these medications   albuterol 108 (90 Base) MCG/ACT inhaler Commonly known as:  ProAir HFA INHALE 2 PUFFS BY MOUTH EVERY 4 HOURS AS NEEDED FOR WHEEZING What changed:    how much to take  how to take this  when to take this  reasons to take this  additional instructions   allopurinol 300 MG tablet Commonly known as:  ZYLOPRIM Take 300 mg by mouth daily. What changed:  Another medication with the same name was removed. Continue taking this medication, and follow the directions you see here.   amoxicillin 500 MG capsule Commonly known as:  AMOXIL Take 2 capsules (1,000 mg total) by mouth every 12 (twelve) hours for 13 days.    atorvastatin 20 MG tablet Commonly known as:  LIPITOR Take 1 tablet (20 mg total) by mouth daily.   bisoprolol 5 MG tablet Commonly known as:  ZEBETA Take 1 tablet (5 mg total) by mouth daily.   budesonide-formoterol 160-4.5 MCG/ACT inhaler Commonly known as:  Symbicort INHALE 2 PUFFS BY MOUTH EVERY 12 HOURS (FIRST THING IN THE MORNING THEN 12 HOURS LATER) What changed:    how much to take  how to take this  when to take this  reasons to take this  additional instructions   clarithromycin 500 MG tablet Commonly known as:  BIAXIN Take 1 tablet (500 mg total) by mouth every 12 (twelve) hours for 13 days.   folic acid 1 MG tablet Commonly known as:  FOLVITE Take 1 tablet (1 mg total) by mouth daily.   furosemide 20 MG tablet Commonly known as:  LASIX Take 1 tablet (20 mg total) by mouth daily.   metFORMIN 1000 MG tablet Commonly known as:  GLUCOPHAGE Take 1,000 mg by mouth 2 (two) times daily.   pantoprazole 40 MG tablet Commonly known as:  PROTONIX Take 1 tablet (40 mg total) by mouth 2 (two) times daily before a meal.   vitamin B-12 1000 MCG tablet Commonly known as:  CYANOCOBALAMIN Take 1 tablet (1,000 mcg total) by mouth daily for 30 days.   Vitamin D (Ergocalciferol) 1.25 MG (50000 UT) Caps capsule Commonly known as:  DRISDOL Take 1 capsule by mouth once a week.      Follow-up [employer], [place]. Call.   Why:  Please contact to see if patient qualifies for the PACE program (Program of All-inclusive Care for the Elderly). Contact information: [place] [address] [phone]        Gatha Mayer, MD. Go on 04/27/2019.   Specialty:  Gastroenterology Why:  Lab in basement and have blood count drawn Contact information: [address] [address] [phone]          Allergies  Allergen Reactions  . Levaquin [Levofloxacin In D5w]     Pt states he is not allergic to  medication, last took it about 8 years ago  . Lisinopril Swelling    Consultations:  Gastroenterology   Procedures/Studies: Dg Chest 1 View  Result Date: 04/20/2019 CLINICAL DATA:  Fall EXAM: CHEST  1 VIEW COMPARISON:  04/08/2019 FINDINGS: Right lower lobe mass again noted, unchanged since prior study measuring approximately 5.3 cm. No confluent opacity on the left. Heart is normal size. No effusions or acute bony abnormality. IMPRESSION: Stable right lower lobe mass. Cannot exclude neoplasm. This could be further evaluated with chest CT. Electronically Signed   By: Rolm Baptise M.D.   On: 04/20/2019 02:17   Dg Chest 2 View  Result Date: 04/08/2019 CLINICAL DATA:  Initial evaluation for acute syncope. EXAM: CHEST - 2 VIEW COMPARISON:  Prior radiograph from 10/06/2017 FINDINGS: Mild cardiomegaly.  Mediastinal silhouette normal. Lungs normally inflated. There is a 5.4 cm ovoid masslike opacity at the right lower lobe, new from previous. Additional irregular densities within the mid and lower right lung otherwise grossly similar. Minimal linear atelectasis and/or scarring at the left lung base. No other consolidative opacity. No edema or effusion. No pneumothorax. No acute osseous finding. Multilevel degenerative spurring noted throughout the visualized spine. IMPRESSION: 1. New 5.4 cm right lower lobe mass, indeterminate. Further evaluation with dedicated cross-sectional imaging of the chest recommended. 2. Cardiomegaly. Electronically Signed   By: Jeannine Boga M.D.   On: 04/08/2019 03:36   Ct Head Wo Contrast  Result Date: 04/20/2019 CLINICAL DATA:  Fall with headache EXAM: CT HEAD WITHOUT CONTRAST CT CERVICAL SPINE WITHOUT CONTRAST TECHNIQUE: Multidetector CT imaging of the head and cervical spine was performed following the standard protocol without intravenous contrast. Multiplanar CT image reconstructions of the cervical spine were also generated. COMPARISON:  04/08/2019 head CT  FINDINGS: CT HEAD FINDINGS Brain: There is no mass, hemorrhage or extra-axial collection. There is generalized atrophy without lobar predilection. There is hypoattenuation of the periventricular white matter, most commonly indicating chronic ischemic microangiopathy. Vascular: No abnormal hyperdensity of the major intracranial arteries or dural venous sinuses. No intracranial atherosclerosis. Skull: The visualized skull base, calvarium and extracranial soft tissues are normal. Sinuses/Orbits: No fluid levels or advanced mucosal thickening of the visualized paranasal sinuses.  No mastoid or middle ear effusion. The orbits are normal. CT CERVICAL SPINE FINDINGS Alignment: No static subluxation. Facets are aligned. Occipital condyles are normally positioned. Skull base and vertebrae: No acute fracture. Soft tissues and spinal canal: No prevertebral fluid or swelling. No visible canal hematoma. Disc levels: No advanced spinal canal or neural foraminal stenosis. Upper chest: No pneumothorax, pulmonary nodule or pleural effusion. Other: Bilateral carotid bifurcation atherosclerosis. IMPRESSION: 1. Generalized atrophy and findings of chronic ischemic microangiopathy without acute intracranial abnormality. 2. No acute fracture or static subluxation of the cervical spine. Electronically Signed   By: Ulyses Jarred M.D.   On: 04/20/2019 03:10   Ct Head Wo Contrast  Result Date: 04/08/2019 CLINICAL DATA:  Head trauma, minor, GCS>=13, high clinical risk, initial exam. Syncopal episode at home. EXAM: CT HEAD WITHOUT CONTRAST CT CERVICAL SPINE WITHOUT CONTRAST TECHNIQUE: Multidetector CT imaging of the head and cervical spine was performed following the standard protocol without intravenous contrast. Multiplanar CT image reconstructions of the cervical spine were also generated. COMPARISON:  None. FINDINGS: CT HEAD FINDINGS Brain: No intracranial hemorrhage, mass effect, or midline shift. Age related atrophy. Moderate to  advanced chronic small vessel ischemia. No hydrocephalus. The basilar cisterns are patent. No evidence of territorial infarct or acute ischemia. No extra-axial or intracranial fluid collection. Vascular: Atherosclerosis of skullbase vasculature without hyperdense vessel or abnormal calcification. Skull: No fracture or focal lesion. Sinuses/Orbits: Mucosal thickening of the ethmoid air cells. Remote medial left orbital fracture. Frontal sinuses are hypo pneumatized. Mastoid air cells are clear. Other: None. CT CERVICAL SPINE FINDINGS Alignment: Slight straightening of normal lordosis. No traumatic subluxation. Skull base and vertebrae: No acute fracture. Vertebral body heights are maintained. The dens and skull base are intact. Soft tissues and spinal canal: No prevertebral fluid or swelling. No visible canal hematoma. Disc levels: Disc space narrowing and endplate spurring throughout, most prominent at C5-C6. Mild scattered facet arthropathy. Upper chest: No acute findings. Other: Dense carotid calcifications. IMPRESSION: 1. No acute intracranial abnormality. No skull fracture. 2. Age related atrophy. Moderate to advanced chronic small vessel ischemia. 3. Degenerative change in the cervical spine without acute fracture or subluxation. 4. Carotid and skullbase atherosclerosis. Electronically Signed   By: Keith Rake M.D.   On: 04/08/2019 03:32   Ct Chest W Contrast  Result Date: 04/21/2019 CLINICAL DATA:  73 year old male with a duodenal ulcer EXAM: CT CHEST, ABDOMEN, AND PELVIS WITH CONTRAST TECHNIQUE: Multidetector CT imaging of the chest, abdomen and pelvis was performed following the standard protocol during bolus administration of intravenous contrast. CONTRAST:  178mL OMNIPAQUE IOHEXOL 300 MG/ML  SOLN COMPARISON:  10/04/2016 FINDINGS: CT CHEST FINDINGS Cardiovascular: Cardiomegaly. No pericardial fluid/thickening. Dense calcifications of the left main, left anterior descending, circumflex, right  coronary arteries. Normal course caliber and contour of the thoracic aorta. Patent branch vessels. Atherosclerotic changes of the aortic arch and descending thoracic aorta. No aneurysm. Unremarkable visualized pulmonary arteries. Mediastinum/Nodes: Small lymph nodes of the mediastinum. No mediastinal adenopathy. Unremarkable course of the thoracic esophagus. Unremarkable thoracic inlet. Lungs/Pleura: Partially calcified granulomas of the right lung apex, similar to the comparison CT of 04/01/2016 and 10/04/2016. No pneumothorax. No confluent airspace disease. Increasing volume of soft tissue in the lateral aspect of the right lower lobe, with large pleural attachment, largest axial dimension 4.6 cm. This was in the region of the previously biopsied and treated carcinoma. Increased nodularity on the deep margin of this plate like soft tissue. In addition there is a new/enlarging irregular mass of  the right lower lobe measuring 4.8 cm, inferior to the previously treated squamous cell carcinoma. There is a tail of tissue that extends to the pleura at the inferior margin at the costophrenic sulcus, as well as the tail that appears to extend to the diaphragm. Atelectasis of the dependent aspects of the left lower lobe. CT ABDOMEN PELVIS FINDINGS Hepatobiliary: Unremarkable liver. Calcified gallstones with no inflammatory changes. Pancreas: Unremarkable pancreas Spleen: Unremarkable spleen Adrenals/Urinary Tract: Unremarkable adrenal glands. Left kidney without hydronephrosis or nephrolithiasis. Unremarkable course of the left ureter. Right kidney demonstrates nonobstructing stones in the superior collecting system, largest measuring 8 mm. Partially septated cyst of the lateral cortex. Unremarkable course of the right ureter. No hydronephrosis or inflammatory changes. Unremarkable urinary bladder. Stomach/Bowel: Stomach decompressed. In the region of the proximal duodenum/pylorus, there is gas and fluid collection  involving the wall, which likely corresponds to the ulcer described on the endoscopy report. I do not see a clear fistula to the duodenum that was described, however, duodenum is decompressed. There is no free air or focal fluid. Single small lymph node within the gastrohepatic ligament. Remainder of small bowel unremarkable, decompressed. No focal inflammatory changes. Appendix is not visualized, however, no inflammatory changes are present adjacent to the cecum to indicate an appendicitis. Colon is decompressed with no focal inflammatory changes. Colonic diverticula. Vascular/Lymphatic: Atherosclerotic changes of the abdominal aorta and the bilateral iliac arteries. No retroperitoneal adenopathy. No free fluid. No pelvic adenopathy. No inguinal adenopathy. Reproductive: Transverse diameter of the prostate measures 4.0 cm Other: Fat containing umbilical hernia. Musculoskeletal: No acute displaced fracture. No aggressive lytic lesions or sclerotic lesions. Advanced degenerative changes of the thoracolumbar spine, with flowing anterior osteophyte production of the thoracolumbar spine. Multilevel degenerative changes through the lumbar spine with vacuum disc phenomenon at L3-L4, L4-L5 levels. IMPRESSION: CT demonstrates evidence of pyloric/duodenal ulcer that was described today on endoscopy. No evidence of free air/perforation. There is a single small lymph node within the gastrohepatic ligament, nonspecific, potentially inflammatory/reactive. 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 Referral for oncology evaluation is recommended. Increasing plate like soft tissue at the periphery of the right lower lobe, abutting the pleura, in the region of previously treated squamous cell carcinoma. Recurrence cannot be excluded. Aortic atherosclerosis and associated coronary artery disease. Aortic  Atherosclerosis (ICD10-I70.0). Ancillary findings as above. Electronically Signed   By: Corrie Mckusick D.O.   On: 04/21/2019 20:28   Ct Cervical Spine Wo Contrast  Result Date: 04/20/2019 CLINICAL DATA:  Fall with headache EXAM: CT HEAD WITHOUT CONTRAST CT CERVICAL SPINE WITHOUT CONTRAST TECHNIQUE: Multidetector CT imaging of the head and cervical spine was performed following the standard protocol without intravenous contrast. Multiplanar CT image reconstructions of the cervical spine were also generated. COMPARISON:  04/08/2019 head CT FINDINGS: CT HEAD FINDINGS Brain: There is no mass, hemorrhage or extra-axial collection. There is generalized atrophy without lobar predilection. There is hypoattenuation of the periventricular white matter, most commonly indicating chronic ischemic microangiopathy. Vascular: No abnormal hyperdensity of the major intracranial arteries or dural venous sinuses. No intracranial atherosclerosis. Skull: The visualized skull base, calvarium and extracranial soft tissues are normal. Sinuses/Orbits: No fluid levels or advanced mucosal thickening of the visualized paranasal sinuses. No mastoid or middle ear effusion. The orbits are normal. CT CERVICAL SPINE FINDINGS Alignment: No static subluxation. Facets are aligned. Occipital condyles are normally positioned. Skull base and vertebrae: No acute  fracture. Soft tissues and spinal canal: No prevertebral fluid or swelling. No visible canal hematoma. Disc levels: No advanced spinal canal or neural foraminal stenosis. Upper chest: No pneumothorax, pulmonary nodule or pleural effusion. Other: Bilateral carotid bifurcation atherosclerosis. IMPRESSION: 1. Generalized atrophy and findings of chronic ischemic microangiopathy without acute intracranial abnormality. 2. No acute fracture or static subluxation of the cervical spine. Electronically Signed   By: Ulyses Jarred M.D.   On: 04/20/2019 03:10   Ct Cervical Spine Wo Contrast  Result Date:  04/08/2019 CLINICAL DATA:  Head trauma, minor, GCS>=13, high clinical risk, initial exam. Syncopal episode at home. EXAM: CT HEAD WITHOUT CONTRAST CT CERVICAL SPINE WITHOUT CONTRAST TECHNIQUE: Multidetector CT imaging of the head and cervical spine was performed following the standard protocol without intravenous contrast. Multiplanar CT image reconstructions of the cervical spine were also generated. COMPARISON:  None. FINDINGS: CT HEAD FINDINGS Brain: No intracranial hemorrhage, mass effect, or midline shift. Age related atrophy. Moderate to advanced chronic small vessel ischemia. No hydrocephalus. The basilar cisterns are patent. No evidence of territorial infarct or acute ischemia. No extra-axial or intracranial fluid collection. Vascular: Atherosclerosis of skullbase vasculature without hyperdense vessel or abnormal calcification. Skull: No fracture or focal lesion. Sinuses/Orbits: Mucosal thickening of the ethmoid air cells. Remote medial left orbital fracture. Frontal sinuses are hypo pneumatized. Mastoid air cells are clear. Other: None. CT CERVICAL SPINE FINDINGS Alignment: Slight straightening of normal lordosis. No traumatic subluxation. Skull base and vertebrae: No acute fracture. Vertebral body heights are maintained. The dens and skull base are intact. Soft tissues and spinal canal: No prevertebral fluid or swelling. No visible canal hematoma. Disc levels: Disc space narrowing and endplate spurring throughout, most prominent at C5-C6. Mild scattered facet arthropathy. Upper chest: No acute findings. Other: Dense carotid calcifications. IMPRESSION: 1. No acute intracranial abnormality. No skull fracture. 2. Age related atrophy. Moderate to advanced chronic small vessel ischemia. 3. Degenerative change in the cervical spine without acute fracture or subluxation. 4. Carotid and skullbase atherosclerosis. Electronically Signed   By: Keith Rake M.D.   On: 04/08/2019 03:32   Mr Jeri Cos IW  Contrast  Result Date: 04/08/2019 CLINICAL DATA:  New lung mass and seizure. EXAM: MRI HEAD WITHOUT AND WITH CONTRAST TECHNIQUE: Multiplanar, multiecho pulse sequences of the brain and surrounding structures were obtained without and with intravenous contrast. CONTRAST:  8 mL Gadavist COMPARISON:  Head CT 04/08/2019 FINDINGS: BRAIN: There is no acute infarct, acute hemorrhage or extra-axial collection. The midline structures are normal. 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 Diffuse confluent hyperintense T2-weighted signal within the periventricular, deep and juxtacortical white matter, most commonly due to chronic ischemic microangiopathy. Generalized atrophy without lobar predilection. Susceptibility-sensitive sequences show no chronic microhemorrhage or superficial siderosis. No abnormal contrast enhancement. VASCULAR: The major intracranial arterial and venous sinus flow voids are normal. SKULL AND UPPER CERVICAL SPINE: Calvarial bone marrow signal is normal. There is no skull base mass. Visualized upper cervical spine and soft tissues are normal. SINUSES/ORBITS: No fluid levels or advanced mucosal thickening. No mastoid or middle ear effusion. The orbits are normal. IMPRESSION: 1. No intracranial metastatic disease. 2. Chronic ischemic microangiopathy. Electronically Signed   By: Ulyses Jarred M.D.   On: 04/08/2019 19:59   Ct Abdomen Pelvis W Contrast  Result Date: 04/21/2019 CLINICAL DATA:  73 year old male with a duodenal ulcer EXAM: CT CHEST, ABDOMEN, AND PELVIS WITH CONTRAST TECHNIQUE: Multidetector CT imaging of the chest, abdomen and pelvis was performed following the standard protocol during bolus administration  of intravenous contrast. CONTRAST:  137mL OMNIPAQUE IOHEXOL 300 MG/ML  SOLN COMPARISON:  10/04/2016 FINDINGS: CT CHEST FINDINGS Cardiovascular: Cardiomegaly. No pericardial fluid/thickening. Dense calcifications of the left main, left anterior descending, circumflex, right coronary arteries.  Normal course caliber and contour of the thoracic aorta. Patent branch vessels. Atherosclerotic changes of the aortic arch and descending thoracic aorta. No aneurysm. Unremarkable visualized pulmonary arteries. Mediastinum/Nodes: Small lymph nodes of the mediastinum. No mediastinal adenopathy. Unremarkable course of the thoracic esophagus. Unremarkable thoracic inlet. Lungs/Pleura: Partially calcified granulomas of the right lung apex, similar to the comparison CT of 04/01/2016 and 10/04/2016. No pneumothorax. No confluent airspace disease. Increasing volume of soft tissue in the lateral aspect of the right lower lobe, with large pleural attachment, largest axial dimension 4.6 cm. This was in the region of the previously biopsied and treated carcinoma. Increased nodularity on the deep margin of this plate like soft tissue. 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 There is a tail of tissue that extends to the pleura at the inferior margin at the costophrenic sulcus, as well as the tail that appears to extend to the diaphragm. Atelectasis of the dependent aspects of the left lower lobe. CT ABDOMEN PELVIS FINDINGS Hepatobiliary: Unremarkable liver. Calcified gallstones with no inflammatory changes. Pancreas: Unremarkable pancreas Spleen: Unremarkable spleen Adrenals/Urinary Tract: Unremarkable adrenal glands. Left kidney without hydronephrosis or nephrolithiasis. Unremarkable course of the left ureter. Right kidney demonstrates nonobstructing stones in the superior collecting system, largest measuring 8 mm. Partially septated cyst of the lateral cortex. Unremarkable course of the right ureter. No hydronephrosis or inflammatory changes. Unremarkable urinary bladder. Stomach/Bowel: Stomach decompressed. In the region of the proximal duodenum/pylorus, there is gas and fluid collection involving the wall, which  likely corresponds to the ulcer described on the endoscopy report. I do not see a clear fistula to the duodenum that was described, however, duodenum is decompressed. There is no free air or focal fluid. Single small lymph node within the gastrohepatic ligament. Remainder of small bowel unremarkable, decompressed. No focal inflammatory changes. Appendix is not visualized, however, no inflammatory changes are present adjacent to the cecum to indicate an appendicitis. Colon is decompressed with no focal inflammatory changes. Colonic diverticula. Vascular/Lymphatic: Atherosclerotic changes of the abdominal aorta and the bilateral iliac arteries. No retroperitoneal adenopathy. No free fluid. No pelvic adenopathy. No inguinal adenopathy. Reproductive: Transverse diameter of the prostate measures 4.0 cm Other: Fat containing umbilical hernia. Musculoskeletal: No acute displaced fracture. No aggressive lytic lesions or sclerotic lesions. Advanced degenerative changes of the thoracolumbar spine, with flowing anterior osteophyte production of the thoracolumbar spine. Multilevel degenerative changes through the lumbar spine with vacuum disc phenomenon at L3-L4, L4-L5 levels. IMPRESSION: CT demonstrates evidence of pyloric/duodenal ulcer that was described today on endoscopy. No evidence of free air/perforation. There is a single small lymph node within the gastrohepatic ligament, nonspecific, potentially inflammatory/reactive. 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 Referral for oncology evaluation is recommended. Increasing plate like soft tissue at the periphery of the right lower lobe, abutting the pleura, in the region of previously treated squamous cell carcinoma. Recurrence cannot be excluded. Aortic atherosclerosis and associated coronary artery disease. Aortic Atherosclerosis  (ICD10-I70.0). Ancillary findings as above. Electronically Signed   By: Corrie Mckusick D.O.   On: 04/21/2019 20:28  Subjective: Feeling well, no melena, no hematochezia, no vomiting.  No abdominal pain, no syncope, no seizures, no confusion or fever.  Discharge Exam: Vitals:   04/24/19 0808 04/24/19 0826  BP:    Pulse:    Resp:    Temp: 97.8 F (36.6 C)   SpO2:  100%   Vitals:   04/24/19 0418 04/24/19 0626 04/24/19 0808 04/24/19 0826  BP:      Pulse:      Resp:      Temp: 98.3 F (36.8 C)  97.8 F (36.6 C)   TempSrc: Oral  Oral   SpO2:    100%  Weight:  86 kg    Height:        General: Pt is alert, awake, not in acute distress Cardiovascular: RRR, nl S1-S2, no murmurs appreciated.   No LE edema.   Respiratory: Normal respiratory rate and rhythm.  CTAB without rales or wheezes. Abdominal: Abdomen soft and non-tender.  No distension or HSM.   Neuro/Psych: Strength symmetric in upper and lower extremities.  Judgment and insight appear moderately impaired by dementia.   The results of significant diagnostics from this hospitalization (including imaging, microbiology, ancillary and laboratory) are listed below for reference.     Microbiology: Recent Results (from the past 240 hour(s))  MRSA PCR Screening     Status: None   Collection Time: 04/20/19  5:10 AM  Result Value Ref Range Status   MRSA by PCR NEGATIVE NEGATIVE Final    Comment:        The GeneXpert MRSA Assay (FDA approved for NASAL specimens only), is one component of a comprehensive MRSA colonization surveillance program. It is not intended to diagnose MRSA infection nor to guide or monitor treatment for MRSA infections. Performed at [facility], [place] [address], [place], [place] [address]      Labs: BNP (last 3 results) Recent Labs    04/08/19 0310  BNP 03.0   Basic Metabolic Panel: Recent Labs  Lab 04/20/19 0351 04/20/19 0900 04/21/19 0420 04/22/19 0316 04/23/19 0227  04/24/19 0314  NA 140  --  139 139 139 139  K 3.4*  --  4.1 4.0 4.2 3.5  CL 111  --  106 108 109 106  CO2 17*  --  21* 22 20* 22  GLUCOSE 135*  --  104* 89 95 104*  BUN 34*  --  26* 13 9 8   CREATININE 1.50*  --  1.34* 1.38* 1.15 1.22  CALCIUM 6.7*  --  8.5* 8.4* 8.6* 8.7*  MG  --  1.4* 2.3  --   --   --    Liver Function Tests: Recent Labs  Lab 04/20/19 0351 04/21/19 0420 04/22/19 0316 04/23/19 0227 04/24/19 0314  AST 14* 14* 16 23 16   ALT 8 11 11 13 12   ALKPHOS 55 65 69 70 71  BILITOT 0.6 0.9 0.7 0.9 0.8  PROT 4.8* 6.0* 6.1* 6.1* 6.3*  ALBUMIN 2.0* 2.5* 2.5* 2.7* 2.6*   No results for input(s): LIPASE, AMYLASE in the last 168 hours. No results for input(s): AMMONIA in the last 168 hours. CBC: Recent Labs  Lab 04/20/19 0147  04/20/19 1140 04/21/19 0420 04/22/19 0316 04/23/19 0227 04/24/19 0314  WBC 13.9*  --   --  11.5* 10.8* 10.7* 9.3  NEUTROABS 11.6*  --   --   --   --   --   --   HGB 7.9*   < > 8.6* 7.7* 7.4* 8.0*  7.9*  HCT 26.2*   < > 26.9* 24.3* 23.6* 26.8* 25.6*  MCV 100.4*  --   --  95.7 96.3 98.5 96.2  PLT 557*  --   --  425* 421* 398 377   < > = values in this interval not displayed.   Cardiac Enzymes: Recent Labs  Lab 04/20/19 0147  TROPONINI <0.03   BNP: Invalid input(s): POCBNP CBG: Recent Labs  Lab 04/23/19 0746 04/23/19 1154 04/23/19 1712 04/23/19 2030 04/24/19 0806  GLUCAP 91 102* 120* 133* 116*   D-Dimer No results for input(s): DDIMER in the last 72 hours. Hgb A1c No results for input(s): HGBA1C in the last 72 hours. Lipid Profile No results for input(s): CHOL, HDL, LDLCALC, TRIG, CHOLHDL, LDLDIRECT in the last 72 hours. Thyroid function studies No results for input(s): TSH, T4TOTAL, T3FREE, THYROIDAB in the last 72 hours.  Invalid input(s): FREET3 Anemia work up No results for input(s): VITAMINB12, FOLATE, FERRITIN, TIBC, IRON, RETICCTPCT in the last 72 hours. Urinalysis    Component Value Date/Time   COLORURINE YELLOW  04/03/2017 2005   APPEARANCEUR CLEAR 04/03/2017 2005   LABSPEC 1.016 04/03/2017 2005   PHURINE 5.0 04/03/2017 2005   GLUCOSEU NEGATIVE 04/03/2017 2005   HGBUR NEGATIVE 04/03/2017 2005   BILIRUBINUR NEGATIVE 04/03/2017 2005   KETONESUR NEGATIVE 04/03/2017 2005   PROTEINUR NEGATIVE 04/03/2017 2005   UROBILINOGEN 1.0 02/22/2012 0238   NITRITE NEGATIVE 04/03/2017 2005   LEUKOCYTESUR NEGATIVE 04/03/2017 2005   Sepsis Labs Invalid input(s): PROCALCITONIN,  WBC,  LACTICIDVEN Microbiology Recent Results (from the past 240 hour(s))  MRSA PCR Screening     Status: None   Collection Time: 04/20/19  5:10 AM  Result Value Ref Range Status   MRSA by PCR NEGATIVE NEGATIVE Final    Comment:        The GeneXpert MRSA Assay (FDA approved for NASAL specimens only), is one component of a comprehensive MRSA colonization surveillance program. It is not intended to diagnose MRSA infection nor to guide or monitor treatment for MRSA infections. Performed at [facility], [place] [address], [place], [place] [address]      Time coordinating discharge: 25 minutes      SIGNED:   Edwin Dada, MD  [facility] 04/24/2019, 1:34 PM

## 2019-04-24 NOTE — Progress Notes (Addendum)
Daily Rounding Note  04/24/2019, 10:45 AM  LOS: 4 days   SUBJECTIVE:   Chief complaint: Duodenal ulcer, anemia, GI bleed. He feels great.  He is being discharged this morning.  Denies nausea, abdominal pain.  Stools no longer dark.  OBJECTIVE:         Vital signs in last 24 hours:    Temp:  [97.8 F (36.6 C)-98.5 F (36.9 C)] 97.8 F (36.6 C) (04/25 0808) Pulse Rate:  [90-95] 95 (04/24 1731) Resp:  [15-20] 20 (04/24 1731) BP: (110-149)/(71-96) 148/71 (04/24 1731) SpO2:  [94 %-100 %] 100 % (04/25 0826) Weight:  [86 kg] 86 kg (04/25 0626) Last BM Date: 04/22/19 Filed Weights   04/21/19 0500 04/23/19 0542 04/24/19 0626  Weight: 90.7 kg 86.5 kg 86 kg   General: Very pleasant, comfortable.  Does not look ill. Heart: RRR. Chest: Clear bilaterally no labored breathing or cough. Abdomen: Soft, not tender or distended.  Active bowel sounds. Extremities: No CCE. Neuro/Psych: Appropriate.  Fluid speech.  Alert and oriented x3.  Telling me about his home on [facility], [place] it is located, that he inherited it from his mother and now lives there with his daughter.  Intake/Output from previous day: 04/24 0701 - 04/25 0700 In: 480 [P.O.:480] Out: 450 [Urine:450]  Intake/Output this shift: Total I/O In: 3 [I.V.:3] Out: -   Lab Results: Recent Labs    04/22/19 0316 04/23/19 0227 04/24/19 0314  WBC 10.8* 10.7* 9.3  HGB 7.4* 8.0* 7.9*  HCT 23.6* 26.8* 25.6*  PLT 421* 398 377   BMET Recent Labs    04/22/19 0316 04/23/19 0227 04/24/19 0314  NA 139 139 139  K 4.0 4.2 3.5  CL 108 109 106  CO2 22 20* 22  GLUCOSE 89 95 104*  BUN 13 9 8   CREATININE 1.38* 1.15 1.22  CALCIUM 8.4* 8.6* 8.7*   LFT Recent Labs    04/22/19 0316 04/23/19 0227 04/24/19 0314  PROT 6.1* 6.1* 6.3*  ALBUMIN 2.5* 2.7* 2.6*  AST 16 23 16   ALT 11 13 12   ALKPHOS 69 70 71  BILITOT 0.7 0.9 0.8   Scheduled Meds: . amoxicillin   1,000 mg Oral Q12H  . clarithromycin  500 mg Oral Q12H  . cyanocobalamin  1,000 mcg Subcutaneous Daily  . folic acid  1 mg Oral Daily  . insulin aspart  0-15 Units Subcutaneous TID WC  . insulin aspart  0-5 Units Subcutaneous QHS  . mometasone-formoterol  2 puff Inhalation BID  . pantoprazole  40 mg Oral BID AC  . sodium chloride flush  3 mL Intravenous Q12H   Continuous Infusions: PRN Meds:.acetaminophen **OR** acetaminophen, albuterol, ondansetron **OR** ondansetron (ZOFRAN) IV   ASSESMENT:   *   FOBT positive, acute melenic stools now resolved..  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 Gastric ulcers.  Esophagitis.  Was taking BC powders and NSAIDs at home.  *    H. pylori positive.  Currently day 2 of 14 amoxicillin, Biaxin, BID PPI for eradication  *     Non-small cell lung cancer.  Has appointment with oncology 5/6.  *      Anemia hgb 6.5.  B12 and folate deficient and being suppplemented.  . Status post PRBC x2.  Hgb stable.    PLAN   *   Home on twice daily pantoprazole, should continue this until he is seen  back by Dr. Silverio Decamp on 5/29 at 10 AM.  Complete 2 weeks H. pylori eradication antibiotics.   *    Emphasize no NSAIDs, aspirin products.    Azucena Freed  04/24/2019, 10:45 AM Phone [phone]  Agree with Ms. Sandi Carne assessment and plan. Gatha Mayer, MD, [facility]

## 2019-04-27 ENCOUNTER — Other Ambulatory Visit (INDEPENDENT_AMBULATORY_CARE_PROVIDER_SITE_OTHER): Payer: Medicare Other

## 2019-04-27 ENCOUNTER — Other Ambulatory Visit: Payer: Self-pay

## 2019-04-27 ENCOUNTER — Encounter: Payer: Self-pay | Admitting: Neurology

## 2019-04-27 DIAGNOSIS — K264 Chronic or unspecified duodenal ulcer with hemorrhage: Secondary | ICD-10-CM

## 2019-04-27 LAB — CBC
HCT: 23.8 % — CL (ref 39.0–52.0)
Hemoglobin: 7.5 g/dL — CL (ref 13.0–17.0)
MCHC: 31.7 g/dL (ref 30.0–36.0)
MCV: 94.6 fl (ref 78.0–100.0)
Platelets: 425 10*3/uL — ABNORMAL HIGH (ref 150.0–400.0)
RBC: 2.52 Mil/uL — ABNORMAL LOW (ref 4.22–5.81)
RDW: 17.7 % — ABNORMAL HIGH (ref 11.5–15.5)
WBC: 11.2 10*3/uL — ABNORMAL HIGH (ref 4.0–10.5)

## 2019-04-27 NOTE — Progress Notes (Signed)
Hgb low but stable  He will see Dr. Silverio Decamp in May  His ferritin was 80 in march but I wonder if it will not be lower now  CBC and ferritin in 1 week please  Dx Chronic duodenal ulcer with hemorrhage

## 2019-04-28 ENCOUNTER — Encounter (HOSPITAL_COMMUNITY): Payer: Self-pay

## 2019-04-28 ENCOUNTER — Inpatient Hospital Stay (HOSPITAL_COMMUNITY)
Admission: EM | Admit: 2019-04-28 | Discharge: 2019-05-01 | DRG: 357 | Disposition: A | Discharge status: Home Health | Payer: Medicare Other | Attending: Internal Medicine | Admitting: Internal Medicine

## 2019-04-28 ENCOUNTER — Emergency Department (HOSPITAL_COMMUNITY): Payer: Medicare Other

## 2019-04-28 ENCOUNTER — Other Ambulatory Visit: Payer: Self-pay

## 2019-04-28 DIAGNOSIS — Z8249 Family history of ischemic heart disease and other diseases of the circulatory system: Secondary | ICD-10-CM

## 2019-04-28 DIAGNOSIS — Z87891 Personal history of nicotine dependence: Secondary | ICD-10-CM

## 2019-04-28 DIAGNOSIS — K2970 Gastritis, unspecified, without bleeding: Secondary | ICD-10-CM

## 2019-04-28 DIAGNOSIS — I252 Old myocardial infarction: Secondary | ICD-10-CM

## 2019-04-28 DIAGNOSIS — D72829 Elevated white blood cell count, unspecified: Secondary | ICD-10-CM | POA: Diagnosis present

## 2019-04-28 DIAGNOSIS — D62 Acute posthemorrhagic anemia: Secondary | ICD-10-CM | POA: Diagnosis present

## 2019-04-28 DIAGNOSIS — I130 Hypertensive heart and chronic kidney disease with heart failure and stage 1 through stage 4 chronic kidney disease, or unspecified chronic kidney disease: Secondary | ICD-10-CM | POA: Diagnosis present

## 2019-04-28 DIAGNOSIS — R630 Anorexia: Secondary | ICD-10-CM | POA: Diagnosis present

## 2019-04-28 DIAGNOSIS — Z8673 Personal history of transient ischemic attack (TIA), and cerebral infarction without residual deficits: Secondary | ICD-10-CM

## 2019-04-28 DIAGNOSIS — I728 Aneurysm of other specified arteries: Secondary | ICD-10-CM | POA: Diagnosis present

## 2019-04-28 DIAGNOSIS — D649 Anemia, unspecified: Secondary | ICD-10-CM | POA: Diagnosis not present

## 2019-04-28 DIAGNOSIS — E538 Deficiency of other specified B group vitamins: Secondary | ICD-10-CM | POA: Diagnosis present

## 2019-04-28 DIAGNOSIS — I2510 Atherosclerotic heart disease of native coronary artery without angina pectoris: Secondary | ICD-10-CM | POA: Diagnosis present

## 2019-04-28 DIAGNOSIS — R251 Tremor, unspecified: Secondary | ICD-10-CM | POA: Diagnosis present

## 2019-04-28 DIAGNOSIS — R531 Weakness: Secondary | ICD-10-CM

## 2019-04-28 DIAGNOSIS — R918 Other nonspecific abnormal finding of lung field: Secondary | ICD-10-CM | POA: Diagnosis present

## 2019-04-28 DIAGNOSIS — Z6831 Body mass index (BMI) 31.0-31.9, adult: Secondary | ICD-10-CM

## 2019-04-28 DIAGNOSIS — J9611 Chronic respiratory failure with hypoxia: Secondary | ICD-10-CM | POA: Diagnosis present

## 2019-04-28 DIAGNOSIS — E1122 Type 2 diabetes mellitus with diabetic chronic kidney disease: Secondary | ICD-10-CM | POA: Diagnosis present

## 2019-04-28 DIAGNOSIS — Z20828 Contact with and (suspected) exposure to other viral communicable diseases: Secondary | ICD-10-CM | POA: Diagnosis present

## 2019-04-28 DIAGNOSIS — Z7984 Long term (current) use of oral hypoglycemic drugs: Secondary | ICD-10-CM

## 2019-04-28 DIAGNOSIS — K209 Esophagitis, unspecified: Secondary | ICD-10-CM | POA: Diagnosis present

## 2019-04-28 DIAGNOSIS — W19XXXA Unspecified fall, initial encounter: Secondary | ICD-10-CM | POA: Diagnosis present

## 2019-04-28 DIAGNOSIS — I951 Orthostatic hypotension: Secondary | ICD-10-CM | POA: Diagnosis present

## 2019-04-28 DIAGNOSIS — E785 Hyperlipidemia, unspecified: Secondary | ICD-10-CM | POA: Diagnosis present

## 2019-04-28 DIAGNOSIS — K264 Chronic or unspecified duodenal ulcer with hemorrhage: Principal | ICD-10-CM | POA: Diagnosis present

## 2019-04-28 DIAGNOSIS — K259 Gastric ulcer, unspecified as acute or chronic, without hemorrhage or perforation: Secondary | ICD-10-CM | POA: Diagnosis present

## 2019-04-28 DIAGNOSIS — Z85118 Personal history of other malignant neoplasm of bronchus and lung: Secondary | ICD-10-CM

## 2019-04-28 DIAGNOSIS — M109 Gout, unspecified: Secondary | ICD-10-CM | POA: Diagnosis present

## 2019-04-28 DIAGNOSIS — R634 Abnormal weight loss: Secondary | ICD-10-CM | POA: Diagnosis present

## 2019-04-28 DIAGNOSIS — E669 Obesity, unspecified: Secondary | ICD-10-CM | POA: Diagnosis present

## 2019-04-28 DIAGNOSIS — B9681 Helicobacter pylori [H. pylori] as the cause of diseases classified elsewhere: Secondary | ICD-10-CM

## 2019-04-28 DIAGNOSIS — F0150 Vascular dementia without behavioral disturbance: Secondary | ICD-10-CM | POA: Diagnosis present

## 2019-04-28 DIAGNOSIS — Z9981 Dependence on supplemental oxygen: Secondary | ICD-10-CM

## 2019-04-28 DIAGNOSIS — N183 Chronic kidney disease, stage 3 unspecified: Secondary | ICD-10-CM | POA: Diagnosis present

## 2019-04-28 DIAGNOSIS — T39395A Adverse effect of other nonsteroidal anti-inflammatory drugs [NSAID], initial encounter: Secondary | ICD-10-CM | POA: Diagnosis present

## 2019-04-28 DIAGNOSIS — Z79899 Other long term (current) drug therapy: Secondary | ICD-10-CM

## 2019-04-28 DIAGNOSIS — I5032 Chronic diastolic (congestive) heart failure: Secondary | ICD-10-CM | POA: Diagnosis present

## 2019-04-28 DIAGNOSIS — K922 Gastrointestinal hemorrhage, unspecified: Secondary | ICD-10-CM

## 2019-04-28 DIAGNOSIS — Z923 Personal history of irradiation: Secondary | ICD-10-CM

## 2019-04-28 DIAGNOSIS — E861 Hypovolemia: Secondary | ICD-10-CM | POA: Diagnosis present

## 2019-04-28 DIAGNOSIS — N179 Acute kidney failure, unspecified: Secondary | ICD-10-CM | POA: Diagnosis present

## 2019-04-28 DIAGNOSIS — D500 Iron deficiency anemia secondary to blood loss (chronic): Secondary | ICD-10-CM | POA: Diagnosis not present

## 2019-04-28 DIAGNOSIS — Y92013 Bedroom of single-family (private) house as the place of occurrence of the external cause: Secondary | ICD-10-CM

## 2019-04-28 DIAGNOSIS — I272 Pulmonary hypertension, unspecified: Secondary | ICD-10-CM | POA: Diagnosis present

## 2019-04-28 DIAGNOSIS — K316 Fistula of stomach and duodenum: Secondary | ICD-10-CM | POA: Diagnosis present

## 2019-04-28 DIAGNOSIS — Z888 Allergy status to other drugs, medicaments and biological substances status: Secondary | ICD-10-CM

## 2019-04-28 DIAGNOSIS — K2971 Gastritis, unspecified, with bleeding: Secondary | ICD-10-CM | POA: Diagnosis present

## 2019-04-28 DIAGNOSIS — C3431 Malignant neoplasm of lower lobe, right bronchus or lung: Secondary | ICD-10-CM | POA: Diagnosis present

## 2019-04-28 DIAGNOSIS — J449 Chronic obstructive pulmonary disease, unspecified: Secondary | ICD-10-CM | POA: Diagnosis present

## 2019-04-28 LAB — COMPREHENSIVE METABOLIC PANEL
ALT: 14 U/L (ref 0–44)
AST: 31 U/L (ref 15–41)
Albumin: 2.8 g/dL — ABNORMAL LOW (ref 3.5–5.0)
Alkaline Phosphatase: 74 U/L (ref 38–126)
Anion gap: 12 (ref 5–15)
BUN: 29 mg/dL — ABNORMAL HIGH (ref 8–23)
CO2: 24 mmol/L (ref 22–32)
Calcium: 9 mg/dL (ref 8.9–10.3)
Chloride: 101 mmol/L (ref 98–111)
Creatinine, Ser: 1.47 mg/dL — ABNORMAL HIGH (ref 0.61–1.24)
GFR calc Af Amer: 54 mL/min — ABNORMAL LOW (ref >60)
GFR calc non Af Amer: 46 mL/min — ABNORMAL LOW (ref >60)
Glucose, Bld: 132 mg/dL — ABNORMAL HIGH (ref 70–99)
Potassium: 4.9 mmol/L (ref 3.5–5.1)
Sodium: 137 mmol/L (ref 135–145)
Total Bilirubin: 0.8 mg/dL (ref 0.3–1.2)
Total Protein: 6.1 g/dL — ABNORMAL LOW (ref 6.5–8.1)

## 2019-04-28 LAB — CBC WITH DIFFERENTIAL/PLATELET
Abs Immature Granulocytes: 0.06 10*3/uL (ref 0.00–0.07)
Basophils Absolute: 0 10*3/uL (ref 0.0–0.1)
Basophils Relative: 0 %
Eosinophils Absolute: 0.2 10*3/uL (ref 0.0–0.5)
Eosinophils Relative: 2 %
HCT: 22.6 % — ABNORMAL LOW (ref 39.0–52.0)
Hemoglobin: 6.7 g/dL — CL (ref 13.0–17.0)
Immature Granulocytes: 1 %
Lymphocytes Relative: 7 %
Lymphs Abs: 0.8 10*3/uL (ref 0.7–4.0)
MCH: 29 pg (ref 26.0–34.0)
MCHC: 29.6 g/dL — ABNORMAL LOW (ref 30.0–36.0)
MCV: 97.8 fL (ref 80.0–100.0)
Monocytes Absolute: 0.7 10*3/uL (ref 0.1–1.0)
Monocytes Relative: 7 %
Neutro Abs: 9 10*3/uL — ABNORMAL HIGH (ref 1.7–7.7)
Neutrophils Relative %: 83 %
Platelets: 394 10*3/uL (ref 150–400)
RBC: 2.31 MIL/uL — ABNORMAL LOW (ref 4.22–5.81)
RDW: 17.3 % — ABNORMAL HIGH (ref 11.5–15.5)
WBC: 10.8 10*3/uL — ABNORMAL HIGH (ref 4.0–10.5)
nRBC: 0.2 % (ref 0.0–0.2)

## 2019-04-28 LAB — URINALYSIS, ROUTINE W REFLEX MICROSCOPIC
Bilirubin Urine: NEGATIVE
Glucose, UA: NEGATIVE mg/dL
Hgb urine dipstick: NEGATIVE
Ketones, ur: NEGATIVE mg/dL
Leukocytes,Ua: NEGATIVE
Nitrite: NEGATIVE
Protein, ur: NEGATIVE mg/dL
Specific Gravity, Urine: 1.009 (ref 1.005–1.030)
pH: 5 (ref 5.0–8.0)

## 2019-04-28 LAB — TROPONIN I: Troponin I: <0.03 ng/mL (ref <0.03)

## 2019-04-28 LAB — BRAIN NATRIURETIC PEPTIDE: B Natriuretic Peptide: 49.5 pg/mL (ref 0.0–100.0)

## 2019-04-28 LAB — PREPARE RBC (CROSSMATCH)

## 2019-04-28 LAB — GLUCOSE, CAPILLARY: Glucose-Capillary: 96 mg/dL (ref 70–99)

## 2019-04-28 LAB — MAGNESIUM: Magnesium: 1.8 mg/dL (ref 1.7–2.4)

## 2019-04-28 LAB — POC OCCULT BLOOD, ED: Fecal Occult Bld: POSITIVE — AB

## 2019-04-28 LAB — PHOSPHORUS: Phosphorus: 3.6 mg/dL (ref 2.5–4.6)

## 2019-04-28 LAB — PROTIME-INR
INR: 1.2 (ref 0.8–1.2)
Prothrombin Time: 15.3 seconds — ABNORMAL HIGH (ref 11.4–15.2)

## 2019-04-28 LAB — SARS CORONAVIRUS 2 BY RT PCR (HOSPITAL ORDER, PERFORMED IN ~~LOC~~ HOSPITAL LAB): SARS Coronavirus 2: NEGATIVE

## 2019-04-28 MED ORDER — SODIUM CHLORIDE 0.9% FLUSH
3.0000 mL | Freq: Two times a day (BID) | INTRAVENOUS | Status: DC
  Administered 2019-04-29 – 2019-04-30 (×3): 3 mL via INTRAVENOUS

## 2019-04-28 MED ORDER — SODIUM CHLORIDE 0.9 % IV SOLN: 250.0000 mL | INTRAVENOUS | Status: DC | PRN

## 2019-04-28 MED ORDER — ACETAMINOPHEN 325 MG PO TABS: 650.0000 mg | ORAL_TABLET | Freq: Four times a day (QID) | ORAL | Status: DC | PRN

## 2019-04-28 MED ORDER — SODIUM CHLORIDE 0.9 % IV SOLN
8.0000 mg/h | INTRAVENOUS | Status: DC
  Administered 2019-04-29 – 2019-04-30 (×4): 8 mg/h via INTRAVENOUS
  Filled 2019-04-28 (×7): qty 80

## 2019-04-28 MED ORDER — MOMETASONE FURO-FORMOTEROL FUM 200-5 MCG/ACT IN AERO
2.0000 | INHALATION_SPRAY | Freq: Two times a day (BID) | RESPIRATORY_TRACT | Status: DC
  Administered 2019-04-29 – 2019-05-01 (×4): 2 via RESPIRATORY_TRACT
  Filled 2019-04-28 (×2): qty 8.8

## 2019-04-28 MED ORDER — SODIUM CHLORIDE 0.9% FLUSH: 3.0000 mL | INTRAVENOUS | Status: DC | PRN

## 2019-04-28 MED ORDER — SODIUM CHLORIDE 0.9 % IV SOLN
10.0000 mL/h | Freq: Once | INTRAVENOUS | Status: AC
  Administered 2019-04-28: 10 mL/h via INTRAVENOUS

## 2019-04-28 MED ORDER — SODIUM CHLORIDE 0.9% IV SOLUTION: Freq: Once | INTRAVENOUS | Status: DC

## 2019-04-28 MED ORDER — SODIUM CHLORIDE 0.9% FLUSH
3.0000 mL | Freq: Two times a day (BID) | INTRAVENOUS | Status: DC
  Administered 2019-04-30: 3 mL via INTRAVENOUS

## 2019-04-28 MED ORDER — INSULIN ASPART 100 UNIT/ML ~~LOC~~ SOLN: 0.0000 [IU] | SUBCUTANEOUS | Status: DC

## 2019-04-28 MED ORDER — ONDANSETRON HCL 4 MG PO TABS: 4.0000 mg | ORAL_TABLET | Freq: Four times a day (QID) | ORAL | Status: DC | PRN

## 2019-04-28 MED ORDER — SODIUM CHLORIDE 0.9 % IV SOLN
80.0000 mg | Freq: Once | INTRAVENOUS | Status: AC
  Administered 2019-04-28: 80 mg via INTRAVENOUS
  Filled 2019-04-28: qty 80

## 2019-04-28 MED ORDER — SODIUM CHLORIDE 0.9 % IV BOLUS
250.0000 mL | Freq: Once | INTRAVENOUS | Status: AC
  Administered 2019-04-28: 250 mL via INTRAVENOUS

## 2019-04-28 MED ORDER — ACETAMINOPHEN 650 MG RE SUPP: 650.0000 mg | Freq: Four times a day (QID) | RECTAL | Status: DC | PRN

## 2019-04-28 MED ORDER — ALBUTEROL SULFATE (2.5 MG/3ML) 0.083% IN NEBU: 3.0000 mL | INHALATION_SOLUTION | RESPIRATORY_TRACT | Status: DC | PRN

## 2019-04-28 MED ORDER — ONDANSETRON HCL 4 MG/2ML IJ SOLN: 4.0000 mg | Freq: Four times a day (QID) | INTRAMUSCULAR | Status: DC | PRN

## 2019-04-28 NOTE — ED Provider Notes (Signed)
[facility] EMERGENCY DEPARTMENT Provider Note   CSN: [id number] Arrival date & time: 04/28/19  1447    History   Chief Complaint Chief Complaint  Patient presents with  . Weakness  . Tremors    HPI John [name] is a 73 y.o. male with a PMH of CHF, COPD, Diabetes, HTN, and Lung Cancer presenting with constant generalized weakness and worsening shortness of breath onset 3 days ago. Patient reports he fell forward on his bed today due to generalized weakness. Patient reports tremors upon standing. Patient states he is typically able to ambulate with cane or walker, but states he is has not been able to ambulate today. Patient denies hitting head, LOC, or headache. Patient reports shortness of breath is worse with exertion. Patient states resting makes symptoms better. Patient denies chest pain or palpitations. Patient reports a dry cough, but denies fever, chills, or sweats. Patient reports 15lbs unintentional weight loss in the last 2 months. Patient reports loss of appetite. Patient reports dizziness upon standing. Patient denies numbness or vision changes. Patient reports rhinorrhea and congestion onset today. Patient denies sick exposures, known COVID-19 exposure, or recent travel. Patient denies leg pain, leg edema, or hx of DVT/PE. Patient reports he sees oncologist, Dr. Earlie Server, and states he finished radiation earlier this month. Patient denies receiving chemotherapy. Patient was recently admitted on 04/21 for a GI bleed and required a transfusion of 2 units. Patient had duodenal ulcer on EGD performed by Dr. Carlean Purl ([facility]) on 04/21/2019. Patient denies alcohol or NSAID use. Patient denies nausea, vomiting, abdominal pain, diarrhea, or stool changes.      HPI  Past Medical History:  Diagnosis Date  . B12 deficiency   . CHF (congestive heart failure) ([place])   . COPD (chronic obstructive pulmonary disease) ([place])   . Diabetes mellitus without complication  ([place])   . Folate deficiency   . Gout   . Hyperlipemia   . Hypertension   . 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 MI (myocardial infarction) ([place])   . Pulmonary hypertension [facility])     Patient Active Problem List   Diagnosis Date Noted  . Helicobacter pylori gastritis   . Chronic duodenal ulcer with hemorrhage   . Multiple gastric ulcers   . Acute esophagitis   . Upper GI bleed 04/20/2019  . Vitamin B12 deficiency 04/20/2019  . Folate deficiency 04/20/2019  . Dehydration 04/20/2019  . Hypotension 04/20/2019  . Melena   . Acute blood loss anemia   . CKD (chronic kidney disease), stage III ([place]) 04/08/2019  . Right lower lobe lung mass 04/08/2019  . Syncope and collapse 04/08/2019  . Symptomatic anemia 04/08/2019  . Occult GI bleeding 04/08/2019  . Hyperglycemia 04/08/2019  . Leukocytosis 04/08/2019  . Syncope 04/08/2019  . Low hemoglobin   . Dyspnea on exertion 08/25/2017  . Bursitis of elbow 03/15/2016  . Obesity (BMI 30-39.9) 12/03/2015  . Primary cancer of right lower lobe of lung ([place]) 06/14/2015  . Solitary pulmonary nodule 03/13/2015  . RVF (right ventricular failure) ([place]) 11/04/2014  . COPD II/III with reversibility  12/19/2012  . Chronic respiratory failure with hypoxia ([place]) 12/18/2012  . Drug-induced hyperglycemia 12/08/2012  . Allergic drug rash 12/06/2012  . Pulmonary HTN ([place]) 03/18/2012  . Abnormal echocardiogram 03/18/2012  . LV dysfunction 03/18/2012  . COPD exacerbation ([place]) 02/22/2012  . Chronic diastolic CHF (congestive heart failure) ([place]) 02/22/2012  . HTN (hypertension) 02/22/2012  .  CAD (coronary artery disease) 02/22/2012  . Acute on chronic systolic heart failure ([place]) 02/22/2012    Past Surgical History:  Procedure Laterality Date  . APPENDECTOMY    . BIOPSY  04/21/2019   Procedure: BIOPSY;  Surgeon: Gatha Mayer, MD;  Location: [facility];  Service: Endoscopy;;  . ESOPHAGOGASTRODUODENOSCOPY  (EGD) WITH PROPOFOL N/A 04/21/2019   Procedure: ESOPHAGOGASTRODUODENOSCOPY (EGD) WITH PROPOFOL;  Surgeon: Gatha Mayer, MD;  Location: [facility];  Service: Endoscopy;  Laterality: N/A;  . LUNG BIOPSY          Home Medications    Prior to Admission medications   Medication Sig Start Date End Date Taking? Authorizing Provider  albuterol (PROAIR HFA) 108 (90 BASE) MCG/ACT inhaler INHALE 2 PUFFS BY MOUTH EVERY 4 HOURS AS NEEDED FOR WHEEZING Patient taking differently: Inhale 2 puffs into the lungs every 4 (four) hours as needed for wheezing.  03/13/15  Yes Tanda Rockers, MD  allopurinol (ZYLOPRIM) 300 MG tablet Take 300 mg by mouth daily. 04/07/19  Yes [provider]  amoxicillin (AMOXIL) 500 MG capsule Take 2 capsules (1,000 mg total) by mouth every 12 (twelve) hours for 13 days. 04/24/19 05/07/19 Yes Danford, Suann Larry, MD  atorvastatin (LIPITOR) 20 MG tablet Take 1 tablet (20 mg total) by mouth daily. 09/26/16  Yes Tanda Rockers, MD  bisoprolol (ZEBETA) 5 MG tablet Take 1 tablet (5 mg total) by mouth daily. 09/26/16  Yes Tanda Rockers, MD  budesonide-formoterol (SYMBICORT) 160-4.5 MCG/ACT inhaler INHALE 2 PUFFS BY MOUTH EVERY 12 HOURS (FIRST THING IN THE MORNING THEN 12 HOURS LATER) Patient taking differently: Inhale 2 puffs into the lungs 2 (two) times daily as needed (SOB).  03/24/17  Yes Tanda Rockers, MD  clarithromycin (BIAXIN) 500 MG tablet Take 1 tablet (500 mg total) by mouth every 12 (twelve) hours for 13 days. 04/24/19 05/07/19 Yes Danford, Suann Larry, MD  folic acid (FOLVITE) 1 MG tablet Take 1 tablet (1 mg total) by mouth daily. 04/09/19 04/08/20 Yes Donne Hazel, MD  furosemide (LASIX) 20 MG tablet Take 1 tablet (20 mg total) by mouth daily. 12/17/16  Yes Tanda Rockers, MD  metFORMIN (GLUCOPHAGE) 1000 MG tablet Take 1,000 mg by mouth 2 (two) times daily. 04/07/19  Yes [provider]  pantoprazole (PROTONIX) 40 MG tablet Take 1 tablet (40 mg total) by  mouth 2 (two) times daily before a meal. 04/24/19  Yes Danford, Suann Larry, MD  vitamin B-12 (CYANOCOBALAMIN) 1000 MCG tablet Take 1 tablet (1,000 mcg total) by mouth daily for 30 days. 04/09/19 05/09/19 Yes Donne Hazel, MD  Vitamin D, Ergocalciferol, (DRISDOL) 1.25 MG (50000 UT) CAPS capsule Take 1 capsule by mouth every Friday.  03/10/19  Yes [provider]    Family History Family History  Problem Relation Age of Onset  . Heart disease Mother   . Cancer Neg Hx     Social History Social History   Tobacco Use  . Smoking status: Former Smoker    Packs/day: 1.50    Years: 60.00    Pack years: 90.00    Types: Cigarettes    Last attempt to quit: 09/29/2012    Years since quitting: 6.5  . Smokeless tobacco: Never Used  Substance Use Topics  . Alcohol use: No    Alcohol/week: 0.0 standard drinks  . Drug use: No     Allergies   Levaquin [levofloxacin in d5w] and Lisinopril   Review of Systems Review of Systems  Constitutional: Positive for appetite change, fatigue and unexpected weight change. Negative for chills, diaphoresis and fever.  HENT: Positive for congestion and rhinorrhea. Negative for sore throat and trouble swallowing.   Eyes: Negative for visual disturbance.  Respiratory: Positive for cough and shortness of breath. Negative for chest tightness, wheezing and stridor.   Cardiovascular: Negative for chest pain, palpitations and leg swelling.  Gastrointestinal: Negative for abdominal pain, blood in stool, diarrhea, nausea and vomiting.  Endocrine: Negative for cold intolerance and heat intolerance.  Genitourinary: Negative for dysuria.  Skin: Negative for pallor, rash and wound.  Allergic/Immunologic: Positive for immunocompromised state. Negative for environmental allergies and food allergies.  Neurological: Positive for dizziness, tremors and weakness. Negative for syncope, speech difficulty, light-headedness, numbness and headaches.   Psychiatric/Behavioral: The patient is not nervous/anxious.    Physical Exam Updated Vital Signs BP (!) 107/57 (BP Location: Right Arm)   Pulse 66   Temp 97.6 F (36.4 C) (Oral)   Resp 18   Ht 5\' 5"  (1.651 m)   Wt 89.4 kg   SpO2 100%   BMI 32.78 kg/m   Physical Exam Vitals signs and nursing note reviewed. Exam conducted with a chaperone present.  Constitutional:      General: He is not in acute distress.    Appearance: He is well-developed. He is not diaphoretic.  HENT:     Head: Normocephalic and atraumatic.     Right Ear: Tympanic membrane, ear canal and external ear normal.     Left Ear: Tympanic membrane, ear canal and external ear normal.     Nose: Nose normal.     Mouth/Throat:     Mouth: Mucous membranes are dry.     Pharynx: No posterior oropharyngeal erythema.  Eyes:     Extraocular Movements: Extraocular movements intact.     Conjunctiva/sclera: Conjunctivae normal.     Pupils: Pupils are equal, round, and reactive to light.  Neck:     Musculoskeletal: Normal range of motion.  Cardiovascular:     Rate and Rhythm: Normal rate and regular rhythm.     Heart sounds: Normal heart sounds. No murmur. No friction rub. No gallop.   Pulmonary:     Effort: Pulmonary effort is normal. No respiratory distress.     Breath sounds: Examination of the right-middle field reveals decreased breath sounds. Examination of the right-lower field reveals decreased breath sounds. Decreased breath sounds present. No wheezing or rales.     Comments: Patient is speaking in full sentences in no acute distress. Oxygen saturation is 100% on room air.  Abdominal:     Palpations: Abdomen is soft.     Tenderness: There is no abdominal tenderness. There is no guarding.  Genitourinary:    Rectum: Guaiac result positive. No tenderness or external hemorrhoid. Normal anal tone.  Musculoskeletal: Normal range of motion.     Right lower leg: No edema.     Left lower leg: No edema.  Skin:     General: Skin is warm.     Findings: No erythema or rash.  Neurological:     Mental Status: He is alert.    Mental Status:  Alert, oriented, thought content appropriate, able to give a coherent history. Speech fluent without evidence of aphasia. Able to follow 2 step commands without difficulty.  Cranial Nerves:  II:  Peripheral visual fields grossly normal, pupils equal, round, reactive to light III,IV, VI: ptosis not present, extra-ocular motions intact bilaterally  V,VII: smile symmetric, facial light touch sensation equal  VIII: hearing grossly normal to voice  IX,X: symmetric elevation of soft palate, uvula elevates symmetrically  XI: bilateral shoulder shrug symmetric and strong XII: midline tongue extension without fassiculations Motor:  Normal tone. 5/5 in upper and lower extremities bilaterally including strong and equal grip strength and dorsiflexion/plantar flexion Sensory: Pinprick and light touch normal in all extremities.  Deep Tendon Reflexes: 2+ and symmetric in the biceps and patella Cerebellar: normal finger-to-nose with bilateral upper extremities Gait: patient is unable to ambulate due to weakness at this time.  CV: distal pulses palpable throughout   ED Treatments / Results  Labs (all labs ordered are listed, but only abnormal results are displayed) Labs Reviewed  CBC WITH DIFFERENTIAL/PLATELET - Abnormal; Notable for the following components:      Result Value   WBC 10.8 (*)    RBC 2.31 (*)    Hemoglobin 6.7 (*)    HCT 22.6 (*)    MCHC 29.6 (*)    RDW 17.3 (*)    Neutro Abs 9.0 (*)    All other components within normal limits  COMPREHENSIVE METABOLIC PANEL - Abnormal; Notable for the following components:   Glucose, Bld 132 (*)    BUN 29 (*)    Creatinine, Ser 1.47 (*)    Total Protein 6.1 (*)    Albumin 2.8 (*)    GFR calc non Af Amer 46 (*)    GFR calc Af Amer 54 (*)    All other components within normal limits  URINALYSIS, ROUTINE W REFLEX  MICROSCOPIC - Abnormal; Notable for the following components:   Color, Urine STRAW (*)    All other components within normal limits  PROTIME-INR - Abnormal; Notable for the following components:   Prothrombin Time 15.3 (*)    All other components within normal limits  POC OCCULT BLOOD, ED - Abnormal; Notable for the following components:   Fecal Occult Bld POSITIVE (*)    All other components within normal limits  SARS CORONAVIRUS 2 (HOSPITAL ORDER, [facility])  BRAIN NATRIURETIC PEPTIDE  TROPONIN I  MAGNESIUM  PHOSPHORUS  TYPE AND SCREEN  PREPARE RBC (CROSSMATCH)    EKG EKG Interpretation  Date/Time:  Wednesday April 28 2019 14:48:17 EDT Ventricular Rate:  81 PR Interval:    QRS Duration: 106 QT Interval:  361 QTC Calculation: 419 R Axis:   29 Text Interpretation:  Sinus rhythm Borderline ST elevation, anterolateral leads no sig change from previous Confirmed by Charlesetta Shanks [phone]) on 04/28/2019 3:42:34 PM   Radiology Dg Chest Port 1 View  Result Date: 04/28/2019 CLINICAL DATA:  Generalized weakness over the last 3 days. Tremors. Syncopal episode. EXAM: PORTABLE CHEST 1 VIEW COMPARISON:  04/20/2019 FINDINGS: Borderline cardiomegaly. Mediastinal shadows are normal. The left chest is clear. 5 cm mass in the right lower lobe as seen on previous imaging. No evidence new finding. No heart failure or effusion. No significant bone abnormality. IMPRESSION: No change.  5 cm right lower lobe mass.  No other active finding. Electronically Signed   By: Nelson Chimes M.D.   On: 04/28/2019 15:46    Procedures Procedures (including critical care time)  Medications Ordered in ED Medications  pantoprazole (PROTONIX) 80 mg in sodium chloride 0.9 % 250 mL (0.32 mg/mL) infusion (has no administration in time range)  sodium chloride 0.9 % bolus 250 mL (0 mLs Intravenous Stopped 04/28/19 1726)  pantoprazole (PROTONIX) 80 mg in sodium chloride 0.9 % 100 mL IVPB (0 mg  Intravenous Stopped  04/28/19 1745)  0.9 %  sodium chloride infusion (10 mL/hr Intravenous New Bag/Given 04/28/19 1718)     Initial Impression / Assessment and Plan / ED Course  I have reviewed the triage vital signs and the nursing notes.  Pertinent labs & imaging results that were available during my care of the patient were reviewed by me and considered in my medical decision making (see chart for details).  Clinical Course as of Apr 27 1858  Wed Apr 28, 2019  1548 CXR reveals no changes. 5 cm right lower lobe mass.  No other active finding.  DG Chest Port 1 View [AH]  1615 Hemoglobin low at 6.7. Will order 2 units of blood.  Hemoglobin(!!): 6.7 [AH]    Clinical Course User Index [AH] Arville Lime, PA-C      Patient presents with generalized weakness. Patient had a syncopal episode while attempting orthostatic vitals. Occult blood test was positive. Patient had a hemoglobin on 6.7. Patient was given IVF, protonix, and transfused with 2 units of blood. Consulted [facility]. GI states she will follow patient. Consulted hospitalist for admission. Hospitalist has agreed to admit patient.  Findings and plan of care discussed with supervising physician Dr. Johnney Killian who personally evaluated and examined this patient.  Final Clinical Impressions(s) / ED Diagnoses   Final diagnoses:  Generalized weakness  Symptomatic anemia    ED Discharge Orders    None       Arville Lime, [place] 04/28/19 1900    Charlesetta Shanks, MD 04/29/19 1736

## 2019-04-28 NOTE — ED Notes (Signed)
Grand-daughter- Heidi Lemay, called and requested to be the only one called with information regarding the pt. Advised I would leave information for the RN to speak with pt. [phone]

## 2019-04-28 NOTE — ED Notes (Signed)
Pt became weak when standing for orthostatics. Pt became unresponsive and with shallow breathes and rapid change of color. This RN called for help, we started fluids and got the EDP to the room asap.

## 2019-04-28 NOTE — ED Notes (Signed)
Pt provided urinal and made aware of the need for urine.

## 2019-04-28 NOTE — H&P (Signed)
History and Physical    ADARIAN BUR KVQ:[id number] DOB: 12-27-1946 DOA: 04/28/2019  PCP: Patient, No Pcp Per   Patient coming from: home   Chief Complaint: melena, lightheadedness/pre-syncope, gen weakness   HPI: ANTHONEE GELIN is a 73 y.o. male with medical history significant for  COPD with chronic hypoxic respiratory failure, chronic diastolic CHF, type 2 diabetes mellitus, lung cancer status post curative radiation, and 2 admissions earlier this month with symptomatic anemia due to GI bleeding, now presenting with recurrence and melena, generalized weakness, lightheadedness, and presyncope.  Patient underwent upper endoscopy 1 week ago with duodenal ulcer that had an adherent clot, ulceration in the prepyloric stomach, and esophagitis.  He was also found to have H. pylori during the recent admission.  Patient has been undergoing treatment for H. pylori, has been avoiding NSAIDs, but has developed recurrence and melena with recurrent generalized weakness, lightheadedness, and near syncope.  He denies any change in his chronic cough, denies any fevers or chills, denies chest pain, and denies any abdominal pain, nausea, or vomiting.  ED Course: Upon arrival to the ED, patient is found to be afebrile, saturating mid 90s on room air, slightly tachycardic, and with blood pressure 102/54.  EKG features a sinus rhythm and chest x-ray is notable for an unchanged 5 cm right lower lobe mass.  Chemistry panel features a creatinine 1.47, similar to priors.  CBC is notable for slight leukocytosis and a normocytic anemia with hemoglobin 6.7, down from 7.9 four days ago.  Fecal occult blood testing is positive.  Rapid COVID-19 test was sent for unknown indication and was negative.  Patient became hypotensive with systolic in the 64P and had a brief loss of consciousness while standing for orthostatic vitals.  He was given 250 cc normal saline with improvement in blood pressure.  He was started on Protonix  infusion.  2 units of packed red blood cells were ordered from the ED.  Gastroenterology was consulted by the ED physician and recommended medical admission.  Review of Systems:  All other systems reviewed and apart from HPI, are negative.  Past Medical History:  Diagnosis Date  . B12 deficiency   . CHF (congestive heart failure) ([place])   . COPD (chronic obstructive pulmonary disease) ([place])   . Diabetes mellitus without complication ([place])   . Folate deficiency   . Gout   . Hyperlipemia   . Hypertension   . 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 MI (myocardial infarction) ([place])   . Pulmonary hypertension ([place])     Past Surgical History:  Procedure Laterality Date  . APPENDECTOMY    . BIOPSY  04/21/2019   Procedure: BIOPSY;  Surgeon: Gatha Mayer, MD;  Location: [facility];  Service: Endoscopy;;  . ESOPHAGOGASTRODUODENOSCOPY (EGD) WITH PROPOFOL N/A 04/21/2019   Procedure: ESOPHAGOGASTRODUODENOSCOPY (EGD) WITH PROPOFOL;  Surgeon: Gatha Mayer, MD;  Location: [facility];  Service: Endoscopy;  Laterality: N/A;  . LUNG BIOPSY       reports that he quit smoking about 6 years ago. His smoking use included cigarettes. He has a 90.00 pack-year smoking history. He has never used smokeless tobacco. He reports that he does not drink alcohol or use drugs.  Allergies  Allergen Reactions  . Levaquin [Levofloxacin In D5w]     Pt states he is not allergic to medication, last took it about 8 years ago  . Lisinopril Swelling    Family History  Problem  Relation Age of Onset  . Heart disease Mother   . Cancer Neg Hx      Prior to Admission medications   Medication Sig Start Date End Date Taking? Authorizing Provider  albuterol (PROAIR HFA) 108 (90 BASE) MCG/ACT inhaler INHALE 2 PUFFS BY MOUTH EVERY 4 HOURS AS NEEDED FOR WHEEZING Patient taking differently: Inhale 2 puffs into the lungs every 4 (four) hours as needed for wheezing.  03/13/15  Yes  Tanda Rockers, MD  allopurinol (ZYLOPRIM) 300 MG tablet Take 300 mg by mouth daily. 04/07/19  Yes [provider]  amoxicillin (AMOXIL) 500 MG capsule Take 2 capsules (1,000 mg total) by mouth every 12 (twelve) hours for 13 days. 04/24/19 05/07/19 Yes Danford, Suann Larry, MD  atorvastatin (LIPITOR) 20 MG tablet Take 1 tablet (20 mg total) by mouth daily. 09/26/16  Yes Tanda Rockers, MD  bisoprolol (ZEBETA) 5 MG tablet Take 1 tablet (5 mg total) by mouth daily. 09/26/16  Yes Tanda Rockers, MD  budesonide-formoterol (SYMBICORT) 160-4.5 MCG/ACT inhaler INHALE 2 PUFFS BY MOUTH EVERY 12 HOURS (FIRST THING IN THE MORNING THEN 12 HOURS LATER) Patient taking differently: Inhale 2 puffs into the lungs 2 (two) times daily as needed (SOB).  03/24/17  Yes Tanda Rockers, MD  clarithromycin (BIAXIN) 500 MG tablet Take 1 tablet (500 mg total) by mouth every 12 (twelve) hours for 13 days. 04/24/19 05/07/19 Yes Danford, Suann Larry, MD  folic acid (FOLVITE) 1 MG tablet Take 1 tablet (1 mg total) by mouth daily. 04/09/19 04/08/20 Yes Donne Hazel, MD  furosemide (LASIX) 20 MG tablet Take 1 tablet (20 mg total) by mouth daily. 12/17/16  Yes Tanda Rockers, MD  metFORMIN (GLUCOPHAGE) 1000 MG tablet Take 1,000 mg by mouth 2 (two) times daily. 04/07/19  Yes [provider]  pantoprazole (PROTONIX) 40 MG tablet Take 1 tablet (40 mg total) by mouth 2 (two) times daily before a meal. 04/24/19  Yes Danford, Suann Larry, MD  vitamin B-12 (CYANOCOBALAMIN) 1000 MCG tablet Take 1 tablet (1,000 mcg total) by mouth daily for 30 days. 04/09/19 05/09/19 Yes Donne Hazel, MD  Vitamin D, Ergocalciferol, (DRISDOL) 1.25 MG (50000 UT) CAPS capsule Take 1 capsule by mouth every Friday.  03/10/19  Yes [provider]    Physical Exam: Vitals:   04/28/19 1905 04/28/19 1920 04/28/19 1921 04/28/19 1928  BP:  (!) 106/47  (!) 106/47  Pulse:  71  72  Resp:  18  19  Temp: 98 F (36.7 C)  97.9 F (36.6 C) 98.4  F (36.9 C)  TempSrc: Oral  Oral Oral  SpO2:  99%  97%  Weight:      Height:        Constitutional:NAD, calm  Eyes:PERTLA, lids and conjunctivae normal ENMT:Mucous membranes are moist. Posterior pharynx clear of any exudate or lesions.  Neck:normal, supple, no masses, no thyromegaly Respiratory:Breath sound diminished bilaterally. No accessory muscle use.  Cardiovascular:S1 &S2 heard, regular rate and rhythm No extremity edema.  Abdomen:Soft, no tenderness. Bowel soundsactivity.  Musculoskeletal:no clubbing / cyanosis. No joint deformity upper and lower extremities.  Skin:Poor turgor. Warm, dry, well-perfused. Neurologic:No facial asymmetry. Sensation intact.Moving all extremitiess.  Psychiatric:Alert and oriented to person, place, and situation.Pleasant and cooperative.  Labs on Admission: I have personally reviewed following labs and imaging studies  CBC: Recent Labs  Lab 04/22/19 0316 04/23/19 0227 04/24/19 0314 04/27/19 1207 04/28/19 1529  WBC 10.8* 10.7* 9.3 11.2* 10.8*  NEUTROABS  --   --   --   --  9.0*  HGB 7.4* 8.0* 7.9* 7.5 Repeated and verified X2.* 6.7*  HCT 23.6* 26.8* 25.6* 23.8 Repeated and verified X2.* 22.6*  MCV 96.3 98.5 96.2 94.6 97.8  PLT 421* 398 377 425.0* 622   Basic Metabolic Panel: Recent Labs  Lab 04/22/19 0316 04/23/19 0227 04/24/19 0314 04/28/19 1529 04/28/19 1601  NA 139 139 139 137  --   K 4.0 4.2 3.5 4.9  --   CL 108 109 106 101  --   CO2 22 20* 22 24  --   GLUCOSE 89 95 104* 132*  --   BUN 13 9 8  29*  --   CREATININE 1.38* 1.15 1.22 1.47*  --   CALCIUM 8.4* 8.6* 8.7* 9.0  --   MG  --   --   --   --  1.8  PHOS  --   --   --   --  3.6   GFR: Estimated Creatinine Clearance: 45.3 mL/min (A) (by C-G formula based on SCr of 1.47 mg/dL (H)). Liver Function Tests: Recent Labs  Lab 04/22/19 0316 04/23/19 0227 04/24/19 0314 04/28/19 1529  AST 16 23 16 31   ALT 11 13 12 14   ALKPHOS 69 70 71 74  BILITOT 0.7  0.9 0.8 0.8  PROT 6.1* 6.1* 6.3* 6.1*  ALBUMIN 2.5* 2.7* 2.6* 2.8*   No results for input(s): LIPASE, AMYLASE in the last 168 hours. No results for input(s): AMMONIA in the last 168 hours. Coagulation Profile: Recent Labs  Lab 04/28/19 1655  INR 1.2   Cardiac Enzymes: Recent Labs  Lab 04/28/19 1529  TROPONINI <0.03   BNP (last 3 results) No results for input(s): PROBNP in the last 8760 hours. HbA1C: No results for input(s): HGBA1C in the last 72 hours. CBG: Recent Labs  Lab 04/23/19 0746 04/23/19 1154 04/23/19 1712 04/23/19 2030 04/24/19 0806  GLUCAP 91 102* 120* 133* 116*   Lipid Profile: No results for input(s): CHOL, HDL, LDLCALC, TRIG, CHOLHDL, LDLDIRECT in the last 72 hours. Thyroid Function Tests: No results for input(s): TSH, T4TOTAL, FREET4, T3FREE, THYROIDAB in the last 72 hours. Anemia Panel: No results for input(s): VITAMINB12, FOLATE, FERRITIN, TIBC, IRON, RETICCTPCT in the last 72 hours. Urine analysis:    Component Value Date/Time   COLORURINE STRAW (A) 04/28/2019 1720   APPEARANCEUR CLEAR 04/28/2019 1720   LABSPEC 1.009 04/28/2019 1720   PHURINE 5.0 04/28/2019 1720   GLUCOSEU NEGATIVE 04/28/2019 1720   HGBUR NEGATIVE 04/28/2019 1720   BILIRUBINUR NEGATIVE 04/28/2019 1720   KETONESUR NEGATIVE 04/28/2019 1720   PROTEINUR NEGATIVE 04/28/2019 1720   UROBILINOGEN 1.0 02/22/2012 0238   NITRITE NEGATIVE 04/28/2019 1720   LEUKOCYTESUR NEGATIVE 04/28/2019 1720   Sepsis Labs: @LABRCNTIP (procalcitonin:4,lacticidven:4) ) Recent Results (from the past 240 hour(s))  MRSA PCR Screening     Status: None   Collection Time: 04/20/19  5:10 AM  Result Value Ref Range Status   MRSA by PCR NEGATIVE NEGATIVE Final    Comment:        The GeneXpert MRSA Assay (FDA approved for NASAL specimens only), is one component of a comprehensive MRSA colonization surveillance program. It is not intended to diagnose MRSA infection nor to guide or monitor treatment for  MRSA infections. Performed at [facility], [place] [address], [place], [place] [address]   SARS Coronavirus 2 [facility] order, Performed in [facility] hospital lab)     Status: None   Collection Time: 04/28/19  4:54 PM  Result Value Ref Range Status  SARS Coronavirus 2 NEGATIVE NEGATIVE Final    Comment: (NOTE) If result is NEGATIVE SARS-CoV-2 target nucleic acids are NOT DETECTED. The SARS-CoV-2 RNA is generally detectable in upper and lower  respiratory specimens during the acute phase of infection. The lowest  concentration of SARS-CoV-2 viral copies this assay can detect is 250  copies / mL. A negative result does not preclude SARS-CoV-2 infection  and should not be used as the sole basis for treatment or other  patient management decisions.  A negative result may occur with  improper specimen collection / handling, submission of specimen other  than nasopharyngeal swab, presence of viral mutation(s) within the  areas targeted by this assay, and inadequate number of viral copies  (<250 copies / mL). A negative result must be combined with clinical  observations, patient history, and epidemiological information. If result is POSITIVE SARS-CoV-2 target nucleic acids are DETECTED. The SARS-CoV-2 RNA is generally detectable in upper and lower  respiratory specimens dur ing the acute phase of infection.  Positive  results are indicative of active infection with SARS-CoV-2.  Clinical  correlation with patient history and other diagnostic information is  necessary to determine patient infection status.  Positive results do  not rule out bacterial infection or co-infection with other viruses. If result is PRESUMPTIVE POSTIVE SARS-CoV-2 nucleic acids MAY BE PRESENT.   A presumptive positive result was obtained on the submitted specimen  and confirmed on repeat testing.  While 2019 novel coronavirus  (SARS-CoV-2) nucleic acids may be present in the submitted sample  additional  confirmatory testing may be necessary for epidemiological  and / or clinical management purposes  to differentiate between  SARS-CoV-2 and other Sarbecovirus currently known to infect humans.  If clinically indicated additional testing with an alternate test  methodology [phone]) is advised. The SARS-CoV-2 RNA is generally  detectable in upper and lower respiratory sp ecimens during the acute  phase of infection. The expected result is Negative. Fact Sheet for Patients:  StrictlyIdeas.no Fact Sheet for Healthcare Providers: BankingDealers.co.za This test is not yet approved or cleared by the [place] FDA and has been authorized for detection and/or diagnosis of SARS-CoV-2 by FDA under an Emergency Use Authorization (EUA).  This EUA will remain in effect (meaning this test can be used) for the duration of the COVID-19 declaration under Section 564(b)(1) of the Act, 21 U.S.C. section 360bbb-3(b)(1), unless the authorization is terminated or revoked sooner. Performed at [facility], [place] [address], [place], [place] [address]      Radiological Exams on Admission: Dg Chest Port 1 View  Result Date: 04/28/2019 CLINICAL DATA:  Generalized weakness over the last 3 days. Tremors. Syncopal episode. EXAM: PORTABLE CHEST 1 VIEW COMPARISON:  04/20/2019 FINDINGS: Borderline cardiomegaly. Mediastinal shadows are normal. The left chest is clear. 5 cm mass in the right lower lobe as seen on previous imaging. No evidence new finding. No heart failure or effusion. No significant bone abnormality. IMPRESSION: No change.  5 cm right lower lobe mass.  No other active finding. Electronically Signed   By: Nelson Chimes M.D.   On: 04/28/2019 15:46    EKG: Independently reviewed. Sinus rhythm, non-specific ST abnormality, similar to prior.   Assessment/Plan   1. Symptomatic anemia; recurrent UGIB  - Presents with recurrence in melena, gen weakness,  light headedness on standing, and pre-syncope - Found to have Hgb of 6.7 in ED, down from 7.9 four days earlier  - He had EGD during recent admission with  duodenal ulcer with adherent clot, ulceration of prepyloric stomach, esophagitis  - He was also found to have H pylori and his condition was suspected secondary to NSAID's and H pylori  - GI is consulted by ED physician and much appreciated  - Continue IV PPI and bowel rest, check post-transfusion CBC, follow-up GI recommendations    2. Syncope; orthostatic hypotension  - Presents with recurrence in melena, gen weakness, light headedness on standing, and pre-syncope  - He became hypotensive and had brief LOC while standing in ED for orthostatic vitals  - Secondary to recurrent GIB  - BP improved with IVF in ED and he is now receiving RBC's  - Hold beta-blocker, address GIB as above, repeat orthostatic vitals in am    3. COPD; chronic hypoxic respiratory failure  -Patient uses 2 Lpm supplemental O2 at baseline, reports his chronic dyspnea is stable -Continue ICS/LABA, supplemental O2, and as-needed albuterol   4. Lung mass; history of lung cancer  - Patient has hx of non small cell lung cancer s/p curative radiotherapy to RML nodule and hilar adenopathy, and had new RLL mass noted during recent admission  - No acute changes on admission CXR  - He will continue oncology follow-up after discharge    5. Chronic diastolic CHF  - Appears hypovolemic in ED, was hypotensive on standing and treated with 250 cc NS  - He is receiving 2 units RBC on admission  - Hold beta-blocker in setting of hypotension, follow daily weights    6. CKD III  - SCr is 1.47 on admission, similar to priors  - Renally-dose medications    7. Type II DM  - A1c was 5.8% earlier this month  - Managed with metformin at home, held on admission  -Check CBG's and use SSI with Novolog as needed while in hospital   8. CAD - No anginal complaints  - Hold  beta-blocker in light of hypotension and avoid antiplatelets with GIB    PPE: Mask, face shield  DVT prophylaxis: SCD's  Code Status: Full  Family Communication: Discussed with patient Consults called: Gastroenterology  Admission status: Observation     Vianne Bulls, MD [facility] Pager [phone]  If 7PM-7AM, please contact night-coverage [email or web address] Password [email or web address]  04/28/2019, 7:32 PM

## 2019-04-28 NOTE — ED Notes (Signed)
Nurse navigator spoke at bedside spoke with patient who wanted to speak to his daughter and update her how he is doing. Nurse and patient spoke with daughter regarding plan of care.

## 2019-04-28 NOTE — ED Provider Notes (Signed)
Medical screening examination/treatment/procedure(s) were conducted as a shared visit with non-physician practitioner(s) and myself.  I personally evaluated the patient during the encounter.  EKG Interpretation  Date/Time:  Wednesday April 28 2019 14:48:17 EDT Ventricular Rate:  81 PR Interval:    QRS Duration: 106 QT Interval:  361 QTC Calculation: 419 R Axis:   29 Text Interpretation:  Sinus rhythm Borderline ST elevation, anterolateral leads no sig change from previous Confirmed by Charlesetta Shanks [phone]) on 04/28/2019 3:42:34 PM  Patient reports he has gotten increasingly weak and feeling short of breath over the past 3 days.  Any exertion and he feels very short of breath and weak.  He feels upon standing that he is shaky and might pass out.  Denies any chest pain.  He reports he has been coughing for several days.  No documented fever or chills.  Patient does have prior history of GI bleed requiring blood transfusion.  I evaluated the patient just at the time that nurse had attempted orthostatic blood pressures.  Patient became profoundly orthostatic with systolic blood pressure dropping down in the 50s and becoming near syncopal.  He did not have loss of consciousness or loss of pulses.  He did not require respiratory support.  Once supine, symptoms began to improve.  His heart is regular.  Peripheral pulses present.  Lungs grossly clear.  Abdomen soft without guarding.  No peripheral edema or calf tenderness.  Dorsalis pedis pulses intact once patient was supine and pressures improved.  Patient is pale in appearance.  His mental status is alert and oriented.  Hemaglobin returns at 6.6.  Consistent with symptomatic anemia.  Suspect recurrence of GI bleed as patient has recently been treated for such.  Will initiate blood transfusion and plan for admission.  With plan of management.  CRITICAL CARE Performed by: Charlesetta Shanks   Total critical care time: 20 minutes  Critical care time  was exclusive of separately billable procedures and treating other patients.  Critical care was necessary to treat or prevent imminent or life-threatening deterioration.  Critical care was time spent personally by me on the following activities: development of treatment plan with patient and/or surrogate as well as nursing, discussions with consultants, evaluation of patient's response to treatment, examination of patient, obtaining history from patient or surrogate, ordering and performing treatments and interventions, ordering and review of laboratory studies, ordering and review of radiographic studies, pulse oximetry and re-evaluation of patient's condition.   Charlesetta Shanks, MD 04/28/19 [phone]

## 2019-04-28 NOTE — ED Notes (Signed)
Report attempted 

## 2019-04-28 NOTE — ED Notes (Signed)
Called lab to add on phos and mag

## 2019-04-28 NOTE — ED Notes (Signed)
Notified EDP, Dr. Maryan Rued of low bp and asked if fluids should be started.

## 2019-04-28 NOTE — ED Triage Notes (Signed)
[facility] reports pt from home with generalized weakness x 3 days and new on set tremors (in legs) with near syncopal when event today. Pt fell across the bed. DOE. BP trending down 147 to 88/40. No fever or cough. cbg 192. Given 100 mL NS enroute.  Lorretta Harp [phone]

## 2019-04-28 NOTE — ED Notes (Signed)
ED TO INPATIENT HANDOFF REPORT  ED Nurse Name and Phone #: Benjamine Mola [phone]  S Name/Age/Gender John [name] 73 y.o. male Room/Bed: 029C/029C  Code Status   Code Status: Prior  Home/SNF/Other Home Patient oriented to: self, place, time and situation Is this baseline? Yes   Triage Complete: Triage complete  Chief Complaint weakness  Triage Note [facility] reports pt from home with generalized weakness x 3 days and new on set tremors (in legs) with near syncopal when event today. Pt fell across the bed. DOE. BP trending down 147 to 88/40. No fever or cough. cbg 192. Given 100 mL NS enroute.  Lorretta Harp [phone]   Allergies Allergies  Allergen Reactions  . Levaquin [Levofloxacin In D5w]     Pt states he is not allergic to medication, last took it about 8 years ago  . Lisinopril Swelling    Level of Care/Admitting Diagnosis ED Disposition    ED Disposition Condition [facility]: [place] [100100]  Level of Care: Progressive [102]  I expect the patient will be discharged within 24 hours: Yes  LOW acuity---Tx typically complete <24 hrs---ACUTE conditions typically can be evaluated <24 hours---LABS likely to return to acceptable levels <24 hours---IS near functional baseline---EXPECTED to return to current living arrangement---NOT newly hypoxic: Does not meet criteria for 5C-Observation unit  Covid Evaluation: N/A  Diagnosis: Symptomatic anemia [5170017]  Admitting Physician: Vianne Bulls [[id number]]  Attending Physician: Vianne Bulls [[id number]]  PT Class (Do Not Modify): Observation [104]  PT Acc Code (Do Not Modify): Observation [10022]       B Medical/Surgery History Past Medical History:  Diagnosis Date  . B12 deficiency   . CHF (congestive heart failure) ([place])   . COPD (chronic obstructive pulmonary disease) ([place])   . Diabetes mellitus without complication ([place])   . Folate deficiency   . Gout   . Hyperlipemia    . Hypertension   . 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 MI (myocardial infarction) ([place])   . Pulmonary hypertension ([place])    Past Surgical History:  Procedure Laterality Date  . APPENDECTOMY    . BIOPSY  04/21/2019   Procedure: BIOPSY;  Surgeon: Gatha Mayer, MD;  Location: [facility];  Service: Endoscopy;;  . ESOPHAGOGASTRODUODENOSCOPY (EGD) WITH PROPOFOL N/A 04/21/2019   Procedure: ESOPHAGOGASTRODUODENOSCOPY (EGD) WITH PROPOFOL;  Surgeon: Gatha Mayer, MD;  Location: [facility];  Service: Endoscopy;  Laterality: N/A;  . LUNG BIOPSY       A IV Location/Drains/Wounds Patient Lines/Drains/Airways Status   Active Line/Drains/Airways    Name:   Placement date:   Placement time:   Site:   Days:   Peripheral IV 04/28/19 Left Antecubital   04/28/19    1451    Antecubital   less than 1   Peripheral IV 04/28/19 Left;Medial;Upper Arm   04/28/19    1835    Arm   less than 1          Intake/Output Last 24 hours  Intake/Output Summary (Last 24 hours) at 04/28/2019 1911 Last data filed at 04/28/2019 1745 Gross per 24 hour  Intake 352.57 ml  Output -  Net 352.57 ml    Labs/Imaging Results for orders placed or performed during the hospital encounter of 04/28/19 (from the past 48 hour(s))  CBC with Differential     Status: Abnormal   Collection Time: 04/28/19  3:29 PM  Result  Value Ref Range   WBC 10.8 (H) 4.0 - 10.5 K/uL   RBC 2.31 (L) 4.22 - 5.81 MIL/uL   Hemoglobin 6.7 (LL) 13.0 - 17.0 g/dL    Comment: REPEATED TO VERIFY THIS CRITICAL RESULT HAS VERIFIED AND BEEN CALLED TO E Zenaya Ulatowski RN BY KIRSTENE FORSYTH ON 04 29 2020 AT 1605, AND HAS BEEN READ BACK.     HCT 22.6 (L) 39.0 - 52.0 %   MCV 97.8 80.0 - 100.0 fL   MCH 29.0 26.0 - 34.0 pg   MCHC 29.6 (L) 30.0 - 36.0 g/dL   RDW 17.3 (H) 11.5 - 15.5 %   Platelets 394 150 - 400 K/uL   nRBC 0.2 0.0 - 0.2 %   Neutrophils Relative % 83 %   Neutro Abs 9.0 (H) 1.7 - 7.7 K/uL   Lymphocytes  Relative 7 %   Lymphs Abs 0.8 0.7 - 4.0 K/uL   Monocytes Relative 7 %   Monocytes Absolute 0.7 0.1 - 1.0 K/uL   Eosinophils Relative 2 %   Eosinophils Absolute 0.2 0.0 - 0.5 K/uL   Basophils Relative 0 %   Basophils Absolute 0.0 0.0 - 0.1 K/uL   Immature Granulocytes 1 %   Abs Immature Granulocytes 0.06 0.00 - 0.07 K/uL    Comment: Performed at [facility] [address] [place], [place] [address]  Brain natriuretic peptide     Status: None   Collection Time: 04/28/19  3:29 PM  Result Value Ref Range   B Natriuretic Peptide 49.5 0.0 - 100.0 pg/mL    Comment: Performed at [facility] [address], [place], [place] [address]  Comprehensive metabolic panel     Status: Abnormal   Collection Time: 04/28/19  3:29 PM  Result Value Ref Range   Sodium 137 135 - 145 mmol/L   Potassium 4.9 3.5 - 5.1 mmol/L   Chloride 101 98 - 111 mmol/L   CO2 24 22 - 32 mmol/L   Glucose, Bld 132 (H) 70 - 99 mg/dL   BUN 29 (H) 8 - 23 mg/dL   Creatinine, Ser 1.47 (H) 0.61 - 1.24 mg/dL   Calcium 9.0 8.9 - 10.3 mg/dL   Total Protein 6.1 (L) 6.5 - 8.1 g/dL   Albumin 2.8 (L) 3.5 - 5.0 g/dL   AST 31 15 - 41 U/L   ALT 14 0 - 44 U/L   Alkaline Phosphatase 74 38 - 126 U/L   Total Bilirubin 0.8 0.3 - 1.2 mg/dL   GFR calc non Af Amer 46 (L) >60 mL/min   GFR calc Af Amer 54 (L) >60 mL/min   Anion gap 12 5 - 15    Comment: Performed at [facility] [address] [place], [place] [address]  Troponin I - Once     Status: None   Collection Time: 04/28/19  3:29 PM  Result Value Ref Range   Troponin I <0.03 <0.03 ng/mL    Comment: Performed at [facility] [address] [place], [place] [address]  Magnesium     Status: None   Collection Time: 04/28/19  4:01 PM  Result Value Ref Range   Magnesium 1.8 1.7 - 2.4 mg/dL    Comment: Performed at [facility] [address], [place], [place] [address]  Phosphorus     Status: None   Collection Time: 04/28/19  4:01 PM  Result  Value Ref Range   Phosphorus 3.6 2.5 - 4.6 mg/dL  Comment: Performed at [facility], [place] [address] [place], [place] [address]  Type and screen [place]     Status: None (Preliminary result)   Collection Time: 04/28/19  4:06 PM  Result Value Ref Range   ABO/RH(D) O POS    Antibody Screen NEG    Sample Expiration 05/01/2019    Unit Number [id number]    Blood Component Type RED CELLS,LR    Unit division 00    Status of Unit ISSUED    Transfusion Status OK TO TRANSFUSE    Crossmatch Result      Compatible Performed at [facility], [place] [address] [place], [place] [address]   POC occult blood, ED     Status: Abnormal   Collection Time: 04/28/19  4:33 PM  Result Value Ref Range   Fecal Occult Bld POSITIVE (A) NEGATIVE  SARS Coronavirus 2 [facility] order, Performed in [facility] hospital lab)     Status: None   Collection Time: 04/28/19  4:54 PM  Result Value Ref Range   SARS Coronavirus 2 NEGATIVE NEGATIVE    Comment: (NOTE) If result is NEGATIVE SARS-CoV-2 target nucleic acids are NOT DETECTED. The SARS-CoV-2 RNA is generally detectable in upper and lower  respiratory specimens during the acute phase of infection. The lowest  concentration of SARS-CoV-2 viral copies this assay can detect is 250  copies / mL. A negative result does not preclude SARS-CoV-2 infection  and should not be used as the sole basis for treatment or other  patient management decisions.  A negative result may occur with  improper specimen collection / handling, submission of specimen other  than nasopharyngeal swab, presence of viral mutation(s) within the  areas targeted by this assay, and inadequate number of viral copies  (<250 copies / mL). A negative result must be combined with clinical  observations, patient history, and epidemiological information. If result is POSITIVE SARS-CoV-2 target nucleic acids are DETECTED. The SARS-CoV-2 RNA is generally detectable  in upper and lower  respiratory specimens dur ing the acute phase of infection.  Positive  results are indicative of active infection with SARS-CoV-2.  Clinical  correlation with patient history and other diagnostic information is  necessary to determine patient infection status.  Positive results do  not rule out bacterial infection or co-infection with other viruses. If result is PRESUMPTIVE POSTIVE SARS-CoV-2 nucleic acids MAY BE PRESENT.   A presumptive positive result was obtained on the submitted specimen  and confirmed on repeat testing.  While 2019 novel coronavirus  (SARS-CoV-2) nucleic acids may be present in the submitted sample  additional confirmatory testing may be necessary for epidemiological  and / or clinical management purposes  to differentiate between  SARS-CoV-2 and other Sarbecovirus currently known to infect humans.  If clinically indicated additional testing with an alternate test  methodology [phone]) is advised. The SARS-CoV-2 RNA is generally  detectable in upper and lower respiratory sp ecimens during the acute  phase of infection. The expected result is Negative. Fact Sheet for Patients:  StrictlyIdeas.no Fact Sheet for Healthcare Providers: BankingDealers.co.za This test is not yet approved or cleared by the [place] FDA and has been authorized for detection and/or diagnosis of SARS-CoV-2 by FDA under an Emergency Use Authorization (EUA).  This EUA will remain in effect (meaning this test can be used) for the duration of the COVID-19 declaration under Section 564(b)(1) of the Act, 21 U.S.C. section 360bbb-3(b)(1), unless the authorization is terminated or revoked  sooner. Performed at [facility], [place] [address] [place], [place] [address]   Protime-INR     Status: Abnormal   Collection Time: 04/28/19  4:55 PM  Result Value Ref Range   Prothrombin Time 15.3 (H) 11.4 - 15.2 seconds   INR 1.2  0.8 - 1.2    Comment: (NOTE) INR goal varies based on device and disease states. Performed at [facility], [place] [address], [place], [place] [address]   Urinalysis, Routine w reflex microscopic     Status: Abnormal   Collection Time: 04/28/19  5:20 PM  Result Value Ref Range   Color, Urine STRAW (A) YELLOW   APPearance CLEAR CLEAR   Specific Gravity, Urine 1.009 1.005 - 1.030   pH 5.0 5.0 - 8.0   Glucose, UA NEGATIVE NEGATIVE mg/dL   Hgb urine dipstick NEGATIVE NEGATIVE   Bilirubin Urine NEGATIVE NEGATIVE   Ketones, ur NEGATIVE NEGATIVE mg/dL   Protein, ur NEGATIVE NEGATIVE mg/dL   Nitrite NEGATIVE NEGATIVE   Leukocytes,Ua NEGATIVE NEGATIVE    Comment: Performed at [facility] [address], [place], [place] [address]  Prepare RBC     Status: None   Collection Time: 04/28/19  5:26 PM  Result Value Ref Range   Order Confirmation      ORDER PROCESSED BY BLOOD BANK Performed at [facility], [place] [address], [place], [place] [address]    Dg Chest Port 1 View  Result Date: 04/28/2019 CLINICAL DATA:  Generalized weakness over the last 3 days. Tremors. Syncopal episode. EXAM: PORTABLE CHEST 1 VIEW COMPARISON:  04/20/2019 FINDINGS: Borderline cardiomegaly. Mediastinal shadows are normal. The left chest is clear. 5 cm mass in the right lower lobe as seen on previous imaging. No evidence new finding. No heart failure or effusion. No significant bone abnormality. IMPRESSION: No change.  5 cm right lower lobe mass.  No other active finding. Electronically Signed   By: Nelson Chimes M.D.   On: 04/28/2019 15:46    Pending Labs Unresulted Labs (From admission, onward)   None      Vitals/Pain Today's Vitals   04/28/19 1756 04/28/19 1900 04/28/19 1905 04/28/19 1910  BP: (!) 107/57 (!) 112/56    Pulse: 66 66    Resp: 18 15    Temp:   98 F (36.7 C)   TempSrc:   Oral   SpO2: 100% 100%    Weight:      Height:      PainSc:    0-No pain    Isolation  Precautions Droplet and Contact precautions  Medications Medications  pantoprazole (PROTONIX) 80 mg in sodium chloride 0.9 % 250 mL (0.32 mg/mL) infusion (has no administration in time range)  sodium chloride 0.9 % bolus 250 mL (0 mLs Intravenous Stopped 04/28/19 1726)  pantoprazole (PROTONIX) 80 mg in sodium chloride 0.9 % 100 mL IVPB (0 mg Intravenous Stopped 04/28/19 1745)  0.9 %  sodium chloride infusion (10 mL/hr Intravenous New Bag/Given 04/28/19 1718)    Mobility walks with person assist Moderate fall risk   Focused Assessments Pulmonary Assessment Handoff:  Lung sounds:   O2 Device: Room Air        R Recommendations: See Admitting Provider Note  Report given to:   Additional Notes:

## 2019-04-29 ENCOUNTER — Encounter (HOSPITAL_COMMUNITY)
Admission: EM | Disposition: A | Discharge status: Home Health | Payer: Self-pay | Source: Home / Self Care | Attending: Internal Medicine

## 2019-04-29 ENCOUNTER — Observation Stay (HOSPITAL_COMMUNITY): Payer: Medicare Other | Admitting: Anesthesiology

## 2019-04-29 ENCOUNTER — Inpatient Hospital Stay (HOSPITAL_COMMUNITY): Payer: Medicare Other

## 2019-04-29 ENCOUNTER — Encounter (HOSPITAL_COMMUNITY): Payer: Self-pay | Admitting: Anesthesiology

## 2019-04-29 DIAGNOSIS — I951 Orthostatic hypotension: Secondary | ICD-10-CM | POA: Diagnosis present

## 2019-04-29 DIAGNOSIS — E1122 Type 2 diabetes mellitus with diabetic chronic kidney disease: Secondary | ICD-10-CM | POA: Diagnosis present

## 2019-04-29 DIAGNOSIS — K209 Esophagitis, unspecified: Secondary | ICD-10-CM | POA: Diagnosis present

## 2019-04-29 DIAGNOSIS — I272 Pulmonary hypertension, unspecified: Secondary | ICD-10-CM | POA: Diagnosis present

## 2019-04-29 DIAGNOSIS — Y92013 Bedroom of single-family (private) house as the place of occurrence of the external cause: Secondary | ICD-10-CM | POA: Diagnosis not present

## 2019-04-29 DIAGNOSIS — K267 Chronic duodenal ulcer without hemorrhage or perforation: Secondary | ICD-10-CM | POA: Diagnosis not present

## 2019-04-29 DIAGNOSIS — F0150 Vascular dementia without behavioral disturbance: Secondary | ICD-10-CM | POA: Diagnosis present

## 2019-04-29 DIAGNOSIS — J9611 Chronic respiratory failure with hypoxia: Secondary | ICD-10-CM | POA: Diagnosis present

## 2019-04-29 DIAGNOSIS — K2971 Gastritis, unspecified, with bleeding: Secondary | ICD-10-CM | POA: Diagnosis present

## 2019-04-29 DIAGNOSIS — I5032 Chronic diastolic (congestive) heart failure: Secondary | ICD-10-CM | POA: Diagnosis present

## 2019-04-29 DIAGNOSIS — N183 Chronic kidney disease, stage 3 (moderate): Secondary | ICD-10-CM | POA: Diagnosis present

## 2019-04-29 DIAGNOSIS — N179 Acute kidney failure, unspecified: Secondary | ICD-10-CM | POA: Diagnosis present

## 2019-04-29 DIAGNOSIS — E861 Hypovolemia: Secondary | ICD-10-CM | POA: Diagnosis present

## 2019-04-29 DIAGNOSIS — K264 Chronic or unspecified duodenal ulcer with hemorrhage: Secondary | ICD-10-CM | POA: Diagnosis present

## 2019-04-29 DIAGNOSIS — W19XXXA Unspecified fall, initial encounter: Secondary | ICD-10-CM | POA: Diagnosis present

## 2019-04-29 DIAGNOSIS — I130 Hypertensive heart and chronic kidney disease with heart failure and stage 1 through stage 4 chronic kidney disease, or unspecified chronic kidney disease: Secondary | ICD-10-CM | POA: Diagnosis present

## 2019-04-29 DIAGNOSIS — J449 Chronic obstructive pulmonary disease, unspecified: Secondary | ICD-10-CM | POA: Diagnosis present

## 2019-04-29 DIAGNOSIS — K2970 Gastritis, unspecified, without bleeding: Secondary | ICD-10-CM | POA: Diagnosis not present

## 2019-04-29 DIAGNOSIS — D500 Iron deficiency anemia secondary to blood loss (chronic): Secondary | ICD-10-CM | POA: Diagnosis present

## 2019-04-29 DIAGNOSIS — D62 Acute posthemorrhagic anemia: Secondary | ICD-10-CM | POA: Diagnosis present

## 2019-04-29 DIAGNOSIS — D649 Anemia, unspecified: Secondary | ICD-10-CM | POA: Diagnosis not present

## 2019-04-29 DIAGNOSIS — D72829 Elevated white blood cell count, unspecified: Secondary | ICD-10-CM | POA: Diagnosis present

## 2019-04-29 DIAGNOSIS — Z6831 Body mass index (BMI) 31.0-31.9, adult: Secondary | ICD-10-CM | POA: Diagnosis not present

## 2019-04-29 DIAGNOSIS — Z9981 Dependence on supplemental oxygen: Secondary | ICD-10-CM | POA: Diagnosis not present

## 2019-04-29 DIAGNOSIS — Z20828 Contact with and (suspected) exposure to other viral communicable diseases: Secondary | ICD-10-CM | POA: Diagnosis present

## 2019-04-29 DIAGNOSIS — K922 Gastrointestinal hemorrhage, unspecified: Secondary | ICD-10-CM | POA: Diagnosis not present

## 2019-04-29 DIAGNOSIS — K316 Fistula of stomach and duodenum: Secondary | ICD-10-CM | POA: Diagnosis present

## 2019-04-29 DIAGNOSIS — E669 Obesity, unspecified: Secondary | ICD-10-CM | POA: Diagnosis present

## 2019-04-29 DIAGNOSIS — I2510 Atherosclerotic heart disease of native coronary artery without angina pectoris: Secondary | ICD-10-CM | POA: Diagnosis present

## 2019-04-29 DIAGNOSIS — B9681 Helicobacter pylori [H. pylori] as the cause of diseases classified elsewhere: Secondary | ICD-10-CM | POA: Diagnosis present

## 2019-04-29 DIAGNOSIS — K259 Gastric ulcer, unspecified as acute or chronic, without hemorrhage or perforation: Secondary | ICD-10-CM | POA: Diagnosis present

## 2019-04-29 HISTORY — PX: IR US GUIDE VASC ACCESS RIGHT: IMG2390

## 2019-04-29 HISTORY — PX: IR ANGIOGRAM VISCERAL SELECTIVE: IMG657

## 2019-04-29 HISTORY — PX: IR EMBO ART  VEN HEMORR LYMPH EXTRAV  INC GUIDE ROADMAPPING: IMG5450

## 2019-04-29 HISTORY — PX: ESOPHAGOGASTRODUODENOSCOPY (EGD) WITH PROPOFOL: SHX5813

## 2019-04-29 HISTORY — PX: HEMOSTASIS CLIP PLACEMENT: SHX6857

## 2019-04-29 HISTORY — PX: IR ANGIOGRAM SELECTIVE EACH ADDITIONAL VESSEL: IMG667

## 2019-04-29 LAB — COMPREHENSIVE METABOLIC PANEL
ALT: 12 U/L (ref 0–44)
AST: 14 U/L — ABNORMAL LOW (ref 15–41)
Albumin: 2.6 g/dL — ABNORMAL LOW (ref 3.5–5.0)
Alkaline Phosphatase: 70 U/L (ref 38–126)
Anion gap: 9 (ref 5–15)
BUN: 31 mg/dL — ABNORMAL HIGH (ref 8–23)
CO2: 25 mmol/L (ref 22–32)
Calcium: 8.7 mg/dL — ABNORMAL LOW (ref 8.9–10.3)
Chloride: 103 mmol/L (ref 98–111)
Creatinine, Ser: 1.37 mg/dL — ABNORMAL HIGH (ref 0.61–1.24)
GFR calc Af Amer: 58 mL/min — ABNORMAL LOW (ref >60)
GFR calc non Af Amer: 50 mL/min — ABNORMAL LOW (ref >60)
Glucose, Bld: 85 mg/dL (ref 70–99)
Potassium: 4.2 mmol/L (ref 3.5–5.1)
Sodium: 137 mmol/L (ref 135–145)
Total Bilirubin: 1.2 mg/dL (ref 0.3–1.2)
Total Protein: 5.8 g/dL — ABNORMAL LOW (ref 6.5–8.1)

## 2019-04-29 LAB — PREPARE RBC (CROSSMATCH)

## 2019-04-29 LAB — HEMOGLOBIN AND HEMATOCRIT, BLOOD
HCT: 20.9 % — ABNORMAL LOW (ref 39.0–52.0)
HCT: 23.2 % — ABNORMAL LOW (ref 39.0–52.0)
HCT: 27.9 % — ABNORMAL LOW (ref 39.0–52.0)
Hemoglobin: 6.4 g/dL — CL (ref 13.0–17.0)
Hemoglobin: 7.3 g/dL — ABNORMAL LOW (ref 13.0–17.0)
Hemoglobin: 9.1 g/dL — ABNORMAL LOW (ref 13.0–17.0)

## 2019-04-29 LAB — POCT I-STAT 4, (NA,K, GLUC, HGB,HCT)
Glucose, Bld: 113 mg/dL — ABNORMAL HIGH (ref 70–99)
Glucose, Bld: 103 mg/dL — ABNORMAL HIGH (ref 70–99)
HCT: 26 % — ABNORMAL LOW (ref 39.0–52.0)
HCT: 20 % — ABNORMAL LOW (ref 39.0–52.0)
Hemoglobin: 8.8 g/dL — ABNORMAL LOW (ref 13.0–17.0)
Hemoglobin: 6.8 g/dL — CL (ref 13.0–17.0)
Potassium: 6.3 mmol/L (ref 3.5–5.1)
Potassium: 4.3 mmol/L (ref 3.5–5.1)
Sodium: 136 mmol/L (ref 135–145)
Sodium: 138 mmol/L (ref 135–145)

## 2019-04-29 LAB — GLUCOSE, CAPILLARY
Glucose-Capillary: 102 mg/dL — ABNORMAL HIGH (ref 70–99)
Glucose-Capillary: 104 mg/dL — ABNORMAL HIGH (ref 70–99)
Glucose-Capillary: 106 mg/dL — ABNORMAL HIGH (ref 70–99)
Glucose-Capillary: 110 mg/dL — ABNORMAL HIGH (ref 70–99)
Glucose-Capillary: 114 mg/dL — ABNORMAL HIGH (ref 70–99)
Glucose-Capillary: 88 mg/dL (ref 70–99)

## 2019-04-29 SURGERY — ESOPHAGOGASTRODUODENOSCOPY (EGD) WITH PROPOFOL
Anesthesia: General

## 2019-04-29 MED ORDER — LIDOCAINE HCL 1 % IJ SOLN
INTRAMUSCULAR | Status: AC | PRN
  Administered 2019-04-29: 10 mL

## 2019-04-29 MED ORDER — PROPOFOL 10 MG/ML IV BOLUS
INTRAVENOUS | Status: DC | PRN
  Administered 2019-04-29: 80 mg via INTRAVENOUS

## 2019-04-29 MED ORDER — PHENYLEPHRINE HCL (PRESSORS) 10 MG/ML IV SOLN
INTRAVENOUS | Status: DC | PRN
  Administered 2019-04-29 (×2): 100 ug via INTRAVENOUS

## 2019-04-29 MED ORDER — SUGAMMADEX SODIUM 200 MG/2ML IV SOLN
INTRAVENOUS | Status: DC | PRN
  Administered 2019-04-29: 200 mg via INTRAVENOUS

## 2019-04-29 MED ORDER — ONDANSETRON HCL 4 MG/2ML IJ SOLN
INTRAMUSCULAR | Status: DC | PRN
  Administered 2019-04-29: 4 mg via INTRAVENOUS

## 2019-04-29 MED ORDER — LACTATED RINGERS IV SOLN
INTRAVENOUS | Status: DC
  Administered 2019-04-29: 15:00:00 via INTRAVENOUS

## 2019-04-29 MED ORDER — SODIUM CHLORIDE 0.9 % IV SOLN
INTRAVENOUS | Status: DC | PRN
  Administered 2019-04-29: 100 ug/min via INTRAVENOUS

## 2019-04-29 MED ORDER — SODIUM CHLORIDE 0.9% IV SOLUTION: Freq: Once | INTRAVENOUS | Status: DC

## 2019-04-29 MED ORDER — ROCURONIUM BROMIDE 100 MG/10ML IV SOLN
INTRAVENOUS | Status: DC | PRN
  Administered 2019-04-29: 20 mg via INTRAVENOUS

## 2019-04-29 MED ORDER — LACTATED RINGERS IV SOLN
INTRAVENOUS | Status: DC | PRN
  Administered 2019-04-29: 14:00:00 via INTRAVENOUS

## 2019-04-29 MED ORDER — ALBUMIN HUMAN 5 % IV SOLN
INTRAVENOUS | Status: DC | PRN
  Administered 2019-04-29: 16:00:00 via INTRAVENOUS

## 2019-04-29 MED ORDER — IOHEXOL 300 MG/ML  SOLN
50.0000 mL | Freq: Once | INTRAMUSCULAR | Status: AC | PRN
  Administered 2019-04-29: 50 mL via INTRA_ARTERIAL

## 2019-04-29 MED ORDER — LIDOCAINE 2% (20 MG/ML) 5 ML SYRINGE
INTRAMUSCULAR | Status: DC | PRN
  Administered 2019-04-29 (×2): 30 mg via INTRAVENOUS

## 2019-04-29 MED ORDER — SUCCINYLCHOLINE CHLORIDE 20 MG/ML IJ SOLN
INTRAMUSCULAR | Status: DC | PRN
  Administered 2019-04-29: 100 mg via INTRAVENOUS

## 2019-04-29 MED ORDER — LIDOCAINE HCL 1 % IJ SOLN
INTRAMUSCULAR | Status: AC
  Filled 2019-04-29: qty 20

## 2019-04-29 MED ORDER — DEXTROSE 50 % IV SOLN
INTRAVENOUS | Status: DC | PRN
  Administered 2019-04-29: 12.5 g via INTRAVENOUS

## 2019-04-29 MED ORDER — PROPOFOL 500 MG/50ML IV EMUL
INTRAVENOUS | Status: DC | PRN
  Administered 2019-04-29: 50 ug/kg/min via INTRAVENOUS

## 2019-04-29 MED ORDER — CLARITHROMYCIN 500 MG PO TABS
500.0000 mg | ORAL_TABLET | Freq: Two times a day (BID) | ORAL | Status: DC
  Administered 2019-04-29 – 2019-05-01 (×4): 500 mg via ORAL
  Filled 2019-04-29 (×5): qty 1

## 2019-04-29 MED ORDER — PHENYLEPHRINE HCL (PRESSORS) 10 MG/ML IV SOLN: INTRAVENOUS | Status: DC | PRN

## 2019-04-29 MED ORDER — AMOXICILLIN 500 MG PO CAPS
1000.0000 mg | ORAL_CAPSULE | Freq: Two times a day (BID) | ORAL | Status: DC
  Administered 2019-04-29 – 2019-05-01 (×4): 1000 mg via ORAL
  Filled 2019-04-29 (×5): qty 2

## 2019-04-29 MED ORDER — PROPOFOL 10 MG/ML IV BOLUS: INTRAVENOUS | Status: DC | PRN

## 2019-04-29 MED ORDER — INSULIN ASPART 100 UNIT/ML ~~LOC~~ SOLN
SUBCUTANEOUS | Status: DC | PRN
  Administered 2019-04-29: 10 [IU] via SUBCUTANEOUS

## 2019-04-29 SURGICAL SUPPLY — 15 items

## 2019-04-29 NOTE — Consult Note (Addendum)
[facility] Consult: 8:28 AM 04/29/2019  LOS: 0 days    Referring Provider: Dr Zigmund Daniel  Primary Care Physician:  Patient, No Pcp Per Primary Gastroenterologist:  Dr. Silverio Decamp.       Reason for Consultation:  Recurrent anemia.  Recent PUD.     HPI: John [name] is a 73 y.o. male.  [place] RLL lung CA 2016, treated with XRT. Pulm htn. COPD, home oxygen. CHF EF 55 to 60% in 2015. DM 2 on oral agents. Htn. Cholelithiasis, possible fatty liver on [place] 03/2017. ACE I angioedema.  Underwent colon cancer screening with stool DNA testing within the last couple of years. No records of colonoscopy.   Evaluated for macrocytic anemia 04/08/2019.  Hgb 5.9.  Only comp was 12.6 in 07/2017. MCV 108.  FOBT positive.  He received 1 PRBC.  Hgb 7.2 on 4/10. Labs confirmed folate and B12 deficiency but not iron deficiency. Dr. Silverio Decamp deferred work-up to future outpt setting given no evidence for overt GI bleeding.  As well he was diagnosed with right lower lobe mass and needed to reestablish with oncology, Dr. Earlie Server, appointment set for 05/05/2019.  Readmitted 4/21, Hgb 7.9 >> 6.5, AKI, hypotensive.  Stools black beginning 4/20.  No orthostatic sxs.  Was taking NSAIDs, Goodies unbenownst to MDs..   4/22 EGD per Dr Carlean Purl: Esophagitis. 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  Gastric ulcers - no stigmata. H Pylori +, Rxd with 2 weeks Amoxil, Biaxin; BID PPI ongoing.  Discharged 4/25 with Hgb 7.9.  Received 1 PRBC during admission.       GI fup 5/29, outpt CBC on 4/28: 7.5.  Increasing weakness, SOB/DOE, + cough.  Appetite is fair to good, which is chronic for him.  No nausea, vomiting.  No black/tarry stools or bloody stool.  Last bowel  movement 2 or 3 days ago.  Orthostatic and near syncopal per [facility] nurse 4/29.  Syncopal in ED.  FOBT + stool.  Hgb 6.7>> 1 U PRBC >>  7.3 this AM.        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   MI (myocardial infarction) ([place])    Pulmonary hypertension ([place])     Past Surgical History:  Procedure Laterality Date   APPENDECTOMY     BIOPSY  04/21/2019   Procedure: BIOPSY;  Surgeon: Gatha Mayer, MD;  Location: [facility] ENDOSCOPY;  Service: Endoscopy;;   ESOPHAGOGASTRODUODENOSCOPY (EGD) WITH PROPOFOL N/A 04/21/2019   Procedure: ESOPHAGOGASTRODUODENOSCOPY (EGD) WITH PROPOFOL;  Surgeon: Gatha Mayer, MD;  Location: [facility] ENDOSCOPY;  Service: Endoscopy;  Laterality: N/A;   LUNG BIOPSY      Prior to Admission  medications   Medication Sig Start Date End Date Taking? Authorizing Provider  albuterol (PROAIR HFA) 108 (90 BASE) MCG/ACT inhaler INHALE 2 PUFFS BY MOUTH EVERY 4 HOURS AS NEEDED FOR WHEEZING Patient taking differently: Inhale 2 puffs into the lungs every 4 (four) hours as needed for wheezing.  03/13/15  Yes Tanda Rockers, MD  allopurinol (ZYLOPRIM) 300 MG tablet Take 300 mg by mouth daily. 04/07/19  Yes [provider]  amoxicillin (AMOXIL) 500 MG capsule Take 2 capsules (1,000 mg total) by mouth every 12 (twelve) hours for 13 days. 04/24/19 05/07/19 Yes Danford, Suann Larry, MD  atorvastatin (LIPITOR) 20 MG tablet Take 1 tablet (20 mg total) by mouth daily. 09/26/16  Yes Tanda Rockers, MD  bisoprolol (ZEBETA) 5 MG tablet Take 1 tablet (5 mg total) by mouth daily. 09/26/16  Yes Tanda Rockers, MD  budesonide-formoterol (SYMBICORT) 160-4.5 MCG/ACT inhaler INHALE 2 PUFFS BY  MOUTH EVERY 12 HOURS (FIRST THING IN THE MORNING THEN 12 HOURS LATER) Patient taking differently: Inhale 2 puffs into the lungs 2 (two) times daily as needed (SOB).  03/24/17  Yes Tanda Rockers, MD  clarithromycin (BIAXIN) 500 MG tablet Take 1 tablet (500 mg total) by mouth every 12 (twelve) hours for 13 days. 04/24/19 05/07/19 Yes Danford, Suann Larry, MD  folic acid (FOLVITE) 1 MG tablet Take 1 tablet (1 mg total) by mouth daily. 04/09/19 04/08/20 Yes Donne Hazel, MD  furosemide (LASIX) 20 MG tablet Take 1 tablet (20 mg total) by mouth daily. 12/17/16  Yes Tanda Rockers, MD  metFORMIN (GLUCOPHAGE) 1000 MG tablet Take 1,000 mg by mouth 2 (two) times daily. 04/07/19  Yes [provider]  pantoprazole (PROTONIX) 40 MG tablet Take 1 tablet (40 mg total) by mouth 2 (two) times daily before a meal. 04/24/19  Yes Danford, Suann Larry, MD  vitamin B-12 (CYANOCOBALAMIN) 1000 MCG tablet Take 1 tablet (1,000 mcg total) by mouth daily for 30 days. 04/09/19 05/09/19 Yes Donne Hazel, MD  Vitamin D, Ergocalciferol, (DRISDOL) 1.25 MG (50000 UT) CAPS capsule Take 1 capsule by mouth every Friday.  03/10/19  Yes [provider]    Scheduled Meds:  sodium chloride   Intravenous Once   insulin aspart  0-7 Units Subcutaneous Q4H   mometasone-formoterol  2 puff Inhalation BID   sodium chloride flush  3 mL Intravenous Q12H   sodium chloride flush  3 mL Intravenous Q12H   Infusions:  sodium chloride     pantoprozole (PROTONIX) infusion 8 mg/hr (04/29/19 0700)   PRN Meds: sodium chloride, acetaminophen **OR** acetaminophen, albuterol, ondansetron **OR** ondansetron (ZOFRAN) IV, sodium chloride flush   Allergies as of 04/28/2019 - Review Complete 04/28/2019  Allergen Reaction Noted   Levaquin [levofloxacin in d5w]  12/06/2012   Lisinopril Swelling 02/22/2012    Family History  Problem Relation Age of Onset   Heart disease Mother    Cancer Neg Hx     Social History    Socioeconomic History   Marital status: Legally Separated    Spouse name: Not on file   Number of children: 2   Years of education: Not on file   Highest education level: Not on file  Occupational History   Not on file  Social Needs   Financial resource strain: Not on file   Food insecurity:    Worry: Not on file    Inability: Not on file   Transportation needs:    Medical: Not on file  Non-medical: Not on file  Tobacco Use   Smoking status: Former Smoker    Packs/day: 1.50    Years: 60.00    Pack years: 90.00    Types: Cigarettes    Last attempt to quit: 09/29/2012    Years since quitting: 6.5   Smokeless tobacco: Never Used  Substance and Sexual Activity   Alcohol use: No    Alcohol/week: 0.0 standard drinks   Drug use: No   Sexual activity: Yes  Lifestyle   Physical activity:    Days per week: Not on file    Minutes per session: Not on file   Stress: Not on file  Relationships   Social connections:    Talks on phone: Not on file    Gets together: Not on file    Attends religious service: Not on file    Active member of club or organization: Not on file    Attends meetings of clubs or organizations: Not on file    Relationship status: Not on file   Intimate partner violence:    Fear of current or ex partner: Not on file    Emotionally abused: Not on file    Physically abused: Not on file    Forced sexual activity: Not on file  Other Topics Concern   Not on file  Social History Narrative   Separated - 1 daughter and 1 son   Retired Horticulturist, commercial   Former smoker, no EtOH    REVIEW OF SYSTEMS: Constitutional: Per HPI. ENT:  No nose bleeds Pulm: Per HPI. CV:  No palpitations, no LE edema.  Chest pain. GU:  No hematuria, no frequency GI: per HPI. Heme: Denies unusual or excessive bleeding or bruising. Transfusions: per HPI. Neuro:  No headaches, no peripheral tingling or numbness Derm:  No itching, no rash or sores.  Endocrine:  No  sweats or chills.  No polyuria or dysuria Immunization: Reviewed. Travel:  None beyond local counties in last few months.    PHYSICAL EXAM: Vital signs in last 24 hours: Vitals:   04/29/19 0012 04/29/19 0433  BP: 127/62 (!) 95/58  Pulse: 69 83  Resp:    Temp: 98.2 F (36.8 C) 99.1 F (37.3 C)  SpO2: 100% 98%   Wt Readings from Last 3 Encounters:  04/29/19 87.8 kg  04/24/19 86 kg  04/09/19 88.7 kg   General: Obese, pleasant, comfortable, alert elderly BM Head: No facial asymmetry or swelling.  No signs of head trauma. Eyes: No scleral icterus.  No conjunctival pallor.  EOMI. Ears: No HOH Nose: No discharge Mouth: Edentulous.  Full upper denture in place, not removed for exam.  Mucosa clear, moist.  Tongue midline. Neck: No JVD, no masses, no thyromegaly. Lungs: No labored breathing or cough.  CTA bilaterally. Heart: RRR.  No MRG.  S1, S2 present. Abdomen: Small umbilical hernia.  Abdominal diastases at upper midline.  No HSM, masses, bruits, hernias.  Mild, nonfocal, left-sided tenderness.  Bowel sounds scant but normal quality.   Rectal: Deferred Musc/Skeltl: No joint redness, swelling or gross deformity.  Some arthritic changes in the hands/fingers. Extremities: No CCE. Neurologic: Fully alert and oriented.  Moves all 4 limbs, strength not tested.  No tremor. Skin: no abrasions, rashes, sores. Tattoos: None observed Nodes: No cervical adenopathy. Psych: Cooperative, calm, pleasant.  Clear speech.   Intake/Output from previous day: 04/29 0701 - 04/30 0700 In: 502.6 [I.V.:150; IV Piggyback:352.6] Out: -  Intake/Output this shift: No intake/output data recorded.  LAB RESULTS:  Recent Labs    04/27/19 1207 04/28/19 1529 04/29/19 0014  WBC 11.2* 10.8*  --   HGB 7.5 Repeated and verified X2.* 6.7* 7.3*  HCT 23.8 Repeated and verified X2.* 22.6* 23.2*  PLT 425.0* 394  --    BMET Lab Results  Component Value Date   NA 137 04/29/2019   NA 137 04/28/2019   NA  139 04/24/2019   K 4.2 04/29/2019   K 4.9 04/28/2019   K 3.5 04/24/2019   CL 103 04/29/2019   CL 101 04/28/2019   CL 106 04/24/2019   CO2 25 04/29/2019   CO2 24 04/28/2019   CO2 22 04/24/2019   GLUCOSE 85 04/29/2019   GLUCOSE 132 (H) 04/28/2019   GLUCOSE 104 (H) 04/24/2019   BUN 31 (H) 04/29/2019   BUN 29 (H) 04/28/2019   BUN 8 04/24/2019   CREATININE 1.37 (H) 04/29/2019   CREATININE 1.47 (H) 04/28/2019   CREATININE 1.22 04/24/2019   CALCIUM 8.7 (L) 04/29/2019   CALCIUM 9.0 04/28/2019   CALCIUM 8.7 (L) 04/24/2019   LFT Recent Labs    04/28/19 1529 04/29/19 0014  PROT 6.1* 5.8*  ALBUMIN 2.8* 2.6*  AST 31 14*  ALT 14 12  ALKPHOS 74 70  BILITOT 0.8 1.2   PT/INR Lab Results  Component Value Date   INR 1.2 04/28/2019   INR 1.4 (H) 04/20/2019   INR 1.3 (H) 04/08/2019   Hepatitis Panel No results for input(s): HEPBSAG, HCVAB, HEPAIGM, HEPBIGM in the last 72 hours. C-Diff No components found for: CDIFF Lipase     Component Value Date/Time   LIPASE 25 04/03/2017 1927    Drugs of Abuse     Component Value Date/Time   LABOPIA NONE DETECTED 05/09/2010 1210   COCAINSCRNUR NONE DETECTED 05/09/2010 1210   LABBENZ NONE DETECTED 05/09/2010 1210   AMPHETMU NONE DETECTED 05/09/2010 1210   THCU NONE DETECTED 05/09/2010 1210   LABBARB  05/09/2010 1210    NONE DETECTED        DRUG SCREEN FOR MEDICAL PURPOSES ONLY.  IF CONFIRMATION IS NEEDED FOR ANY PURPOSE, NOTIFY LAB WITHIN 5 DAYS.        LOWEST DETECTABLE LIMITS FOR URINE DRUG SCREEN Drug Class       Cutoff (ng/mL) Amphetamine      1000 Barbiturate      200 Benzodiazepine   016 Tricyclics       010 Opiates          300 Cocaine          300 THC              50     RADIOLOGY STUDIES: Dg Chest Port 1 View  Result Date: 04/28/2019 CLINICAL DATA:  Generalized weakness over the last 3 days. Tremors. Syncopal episode. EXAM: PORTABLE CHEST 1 VIEW COMPARISON:  04/20/2019 FINDINGS: Borderline cardiomegaly.  Mediastinal shadows are normal. The left chest is clear. 5 cm mass in the right lower lobe as seen on previous imaging. No evidence new finding. No heart failure or effusion. No significant bone abnormality. IMPRESSION: No change.  5 cm right lower lobe mass.  No other active finding. Electronically Signed   By: Nelson Chimes M.D.   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 H Pylori +, on Amoxil, Biaxin. Also using NSAIDs, Goodies prior to recent admissions.    *  Blood loss anemia, recurrent.  S/p 1 U PRBCs.  Transfused blood on last 2 admissions B12 and folate deficiency dx established on admission #1, on supplements.    *  Lung mass in pt with hx NSCLC.  Sees oncologist next week.         PLAN:     *   EGD today.  Case discussed with Dr. Carita Pian  04/29/2019, 8:28 AM Phone [phone]   Attending physician's note   I have taken a history, examined the patient and reviewed the chart. I agree with the Advanced Practitioner's note, impression and recommendations.  73 year old male with history of lung cancer status post radiation therapy, COPD on home O2, CHF, he was recently hospitalized with GI bleed.  EGD by Dr. 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  Biopsy positive for H. pylori and also NSAID use history. Admitted with worsening anemia yesterday, shortness of breath and dyspnea on exertion.  Presentation concerning for ongoing GI bleed from the large cratered duodenal ulcer.   We will proceed with EGD The risks and benefits as well as alternatives of endoscopic procedure(s) have been discussed and reviewed. All questions answered. The patient agrees to proceed.  Damaris Hippo , MD

## 2019-04-29 NOTE — Progress Notes (Signed)
Patient transported to IR from Endo with endo nurse and CRNA.

## 2019-04-29 NOTE — Anesthesia Postprocedure Evaluation (Signed)
Anesthesia Post Note  Patient: John [name]  Procedure(s) Performed: ESOPHAGOGASTRODUODENOSCOPY (EGD) WITH PROPOFOL (N/A ) HEMOSTASIS CLIP PLACEMENT     Patient location during evaluation: Endoscopy Anesthesia Type: General Level of consciousness: awake Pain management: pain level controlled Respiratory status: spontaneous breathing Cardiovascular status: stable Postop Assessment: no apparent nausea or vomiting Anesthetic complications: no    Last Vitals:  Vitals:   04/29/19 1930 04/29/19 1951  BP: 136/68 130/62  Pulse: (!) 107 (!) 107  Resp: (!) 23 20  Temp: (!) 36.3 C 36.6 C  SpO2: 94% 94%    Last Pain:  Vitals:   04/29/19 1951  TempSrc: Oral  PainSc:                  Ryken Paschal

## 2019-04-29 NOTE — Consult Note (Signed)
Chief Complaint: Patient was seen in consultation today for mesenteric arteriogram with embolization Chief Complaint  Patient presents with   Weakness   Tremors   at the request of Dr Pat Kocher   Supervising Physician: Markus Daft  Patient Status: [facility] - In-pt  History of Present Illness: John [name] is a 73 y.o. male   Hx Lung Ca Pulm HTN; COPD; CHF; DM Poss fatty liver  Recent hypotension Black stools Chronic duodenal ulcer -- taking NSAIDs at home Weakness and los of appetite Near syncopal at home Admitted for evaluation  Dr Silverio Decamp consult:  *    Recurrent GIB in pt with large duodenal ulcer, fistulized to prepylorus vs prepyloric ulcer fistulizing to duodenum. H Pylori +, on Amoxil, Biaxin. Also using NSAIDs, Goodies prior to recent admissions.    *   Blood loss anemia, recurrent.  S/p 1 U PRBCs.  Transfused blood on last 2 admissions B12 and folate deficiency dx established on admission #1, on supplements.    *  Lung mass in pt with hx NSCLC.  Sees oncologist next week.     Endo today: Findings: Clotted blood was found in the gastric body extending to fundus. One non-obstructing non-bleeding cratered duodenal ulcer with large adherent clot was found in the duodenal bulb, fistulous opening into gastric prepyloric area. The lesion was 12 mm in largest dimension. There is no evidence of perforation. For location marking, one hemostatic clip was successfully placed (MR conditional). The second portion of the duodenum was normal.  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   MI (myocardial infarction) ([place])    Pulmonary hypertension ([place])     Past Surgical History:  Procedure Laterality Date   APPENDECTOMY     BIOPSY  04/21/2019   Procedure: BIOPSY;  Surgeon: Gatha Mayer, MD;  Location: [facility] ENDOSCOPY;  Service: Endoscopy;;   ESOPHAGOGASTRODUODENOSCOPY (EGD) WITH PROPOFOL N/A 04/21/2019   Procedure: ESOPHAGOGASTRODUODENOSCOPY (EGD) WITH PROPOFOL;  Surgeon: Gatha Mayer, MD;  Location: [facility] ENDOSCOPY;  Service: Endoscopy;  Laterality: N/A;   LUNG BIOPSY      Allergies: Levaquin [levofloxacin in d5w] and Lisinopril  Medications: Prior to Admission medications   Medication Sig Start Date End Date Taking? Authorizing Provider  albuterol (PROAIR HFA) 108 (90 BASE) MCG/ACT inhaler INHALE 2 PUFFS BY MOUTH EVERY 4 HOURS AS NEEDED FOR WHEEZING Patient taking differently: Inhale 2 puffs into the lungs every 4 (four) hours as needed for wheezing.  03/13/15  Yes Tanda Rockers, MD  allopurinol (ZYLOPRIM) 300 MG tablet Take 300 mg by mouth daily. 04/07/19  Yes [provider]  amoxicillin (AMOXIL) 500 MG capsule Take 2 capsules (1,000 mg total) by mouth every 12 (twelve) hours for 13 days. 04/24/19 05/07/19 Yes Danford, Suann Larry, MD  atorvastatin (LIPITOR) 20 MG tablet Take 1 tablet (20 mg total) by mouth daily. 09/26/16  Yes Tanda Rockers, MD  bisoprolol (ZEBETA) 5 MG tablet Take 1 tablet (5 mg total) by mouth daily. 09/26/16  Yes  Tanda Rockers, MD  budesonide-formoterol (SYMBICORT) 160-4.5 MCG/ACT inhaler INHALE 2 PUFFS BY MOUTH EVERY 12 HOURS (FIRST THING IN THE MORNING THEN 12 HOURS LATER) Patient taking differently: Inhale 2 puffs into the lungs 2 (two) times daily as needed (SOB).  03/24/17  Yes Tanda Rockers, MD  clarithromycin (BIAXIN) 500 MG tablet Take 1 tablet (500 mg total) by mouth every 12 (twelve) hours for 13 days. 04/24/19 05/07/19 Yes Danford, Suann Larry, MD  folic acid (FOLVITE)  1 MG tablet Take 1 tablet (1 mg total) by mouth daily. 04/09/19 04/08/20 Yes Donne Hazel, MD  furosemide (LASIX) 20 MG tablet Take 1 tablet (20 mg total) by mouth daily. 12/17/16  Yes Tanda Rockers, MD  metFORMIN (GLUCOPHAGE) 1000 MG tablet Take 1,000 mg by mouth 2 (two) times daily. 04/07/19  Yes [provider]  pantoprazole (PROTONIX) 40 MG tablet Take 1 tablet (40 mg total) by mouth 2 (two) times daily before a meal. 04/24/19  Yes Danford, Suann Larry, MD  vitamin B-12 (CYANOCOBALAMIN) 1000 MCG tablet Take 1 tablet (1,000 mcg total) by mouth daily for 30 days. 04/09/19 05/09/19 Yes Donne Hazel, MD  Vitamin D, Ergocalciferol, (DRISDOL) 1.25 MG (50000 UT) CAPS capsule Take 1 capsule by mouth every Friday.  03/10/19  Yes [provider]     Family History  Problem Relation Age of Onset   Heart disease Mother    Cancer Neg Hx     Social History   Socioeconomic History   Marital status: Legally Separated    Spouse name: Not on file   Number of children: 2   Years of education: Not on file   Highest education level: Not on file  Occupational History   Not on file  Social Needs   Financial resource strain: Not on file   Food insecurity:    Worry: Not on file    Inability: Not on file   Transportation needs:    Medical: Not on file    Non-medical: Not on file  Tobacco Use   Smoking status: Former Smoker    Packs/day: 1.50    Years: 60.00    Pack years: 90.00    Types: Cigarettes    Last attempt to quit: 09/29/2012    Years since quitting: 6.5   Smokeless tobacco: Never Used  Substance and Sexual Activity   Alcohol use: No    Alcohol/week: 0.0 standard drinks   Drug use: No   Sexual activity: Yes  Lifestyle   Physical activity:    Days per week: Not on file    Minutes per session: Not on file   Stress: Not on file  Relationships   Social connections:    Talks on phone: Not on file    Gets together: Not on file    Attends  religious service: Not on file    Active member of club or organization: Not on file    Attends meetings of clubs or organizations: Not on file    Relationship status: Not on file  Other Topics Concern   Not on file  Social History Narrative   Separated - 1 daughter and 1 son   Retired Horticulturist, commercial   Former smoker, no EtOH    Review of Systems: A 12 point ROS discussed and pertinent positives are indicated in the HPI above.  All other systems are negative.   Vital Signs: BP (!) 90/54    Pulse 96    Temp 98.5 F (36.9  C) (Axillary)    Resp 17    Ht 5\' 5"  (1.651 m)    Wt 193 lb 9 oz (87.8 kg)    SpO2 100%    BMI 32.21 kg/m   Physical Exam Vitals signs reviewed.  Constitutional:      Comments: Intubated  Cardiovascular:     Rate and Rhythm: Normal rate.  Pulmonary:     Breath sounds: Normal breath sounds.  Skin:    General: Skin is warm and dry.  Psychiatric:     Comments: Consented with Dtr Lynette via phone     Imaging: Dg Chest 1 View  Result Date: 04/20/2019 CLINICAL DATA:  Fall EXAM: CHEST  1 VIEW COMPARISON:  04/08/2019 FINDINGS: Right lower lobe mass again noted, unchanged since prior study measuring approximately 5.3 cm. No confluent opacity on the left. Heart is normal size. No effusions or acute bony abnormality. IMPRESSION: Stable right lower lobe mass. Cannot exclude neoplasm. This could be further evaluated with chest CT. Electronically Signed   By: Rolm Baptise M.D.   On: 04/20/2019 02:17   Dg Chest 2 View  Result Date: 04/08/2019 CLINICAL DATA:  Initial evaluation for acute syncope. EXAM: CHEST - 2 VIEW COMPARISON:  Prior radiograph from 10/06/2017 FINDINGS: Mild cardiomegaly.  Mediastinal silhouette normal. Lungs normally inflated. There is a 5.4 cm ovoid masslike opacity at the right lower lobe, new from previous. Additional irregular densities within the mid and lower right lung otherwise grossly similar. Minimal linear atelectasis and/or scarring at the left  lung base. No other consolidative opacity. No edema or effusion. No pneumothorax. No acute osseous finding. Multilevel degenerative spurring noted throughout the visualized spine. IMPRESSION: 1. New 5.4 cm right lower lobe mass, indeterminate. Further evaluation with dedicated cross-sectional imaging of the chest recommended. 2. Cardiomegaly. Electronically Signed   By: Jeannine Boga M.D.   On: 04/08/2019 03:36   Ct Head Wo Contrast  Result Date: 04/20/2019 CLINICAL DATA:  Fall with headache EXAM: CT HEAD WITHOUT CONTRAST CT CERVICAL SPINE WITHOUT CONTRAST TECHNIQUE: Multidetector CT imaging of the head and cervical spine was performed following the standard protocol without intravenous contrast. Multiplanar CT image reconstructions of the cervical spine were also generated. COMPARISON:  04/08/2019 head CT FINDINGS: CT HEAD FINDINGS Brain: There is no mass, hemorrhage or extra-axial collection. There is generalized atrophy without lobar predilection. There is hypoattenuation of the periventricular white matter, most commonly indicating chronic ischemic microangiopathy. Vascular: No abnormal hyperdensity of the major intracranial arteries or dural venous sinuses. No intracranial atherosclerosis. Skull: The visualized skull base, calvarium and extracranial soft tissues are normal. Sinuses/Orbits: No fluid levels or advanced mucosal thickening of the visualized paranasal sinuses. No mastoid or middle ear effusion. The orbits are normal. CT CERVICAL SPINE FINDINGS Alignment: No static subluxation. Facets are aligned. Occipital condyles are normally positioned. Skull base and vertebrae: No acute fracture. Soft tissues and spinal canal: No prevertebral fluid or swelling. No visible canal hematoma. Disc levels: No advanced spinal canal or neural foraminal stenosis. Upper chest: No pneumothorax, pulmonary nodule or pleural effusion. Other: Bilateral carotid bifurcation atherosclerosis. IMPRESSION: 1. Generalized  atrophy and findings of chronic ischemic microangiopathy without acute intracranial abnormality. 2. No acute fracture or static subluxation of the cervical spine. Electronically Signed   By: Ulyses Jarred M.D.   On: 04/20/2019 03:10   Ct Head Wo Contrast  Result Date: 04/08/2019 CLINICAL DATA:  Head trauma, minor, GCS>=13, high clinical risk, initial exam. Syncopal episode at home. EXAM: CT HEAD WITHOUT CONTRAST  CT CERVICAL SPINE WITHOUT CONTRAST TECHNIQUE: Multidetector CT imaging of the head and cervical spine was performed following the standard protocol without intravenous contrast. Multiplanar CT image reconstructions of the cervical spine were also generated. COMPARISON:  None. FINDINGS: CT HEAD FINDINGS Brain: No intracranial hemorrhage, mass effect, or midline shift. Age related atrophy. Moderate to advanced chronic small vessel ischemia. No hydrocephalus. The basilar cisterns are patent. No evidence of territorial infarct or acute ischemia. No extra-axial or intracranial fluid collection. Vascular: Atherosclerosis of skullbase vasculature without hyperdense vessel or abnormal calcification. Skull: No fracture or focal lesion. Sinuses/Orbits: Mucosal thickening of the ethmoid air cells. Remote medial left orbital fracture. Frontal sinuses are hypo pneumatized. Mastoid air cells are clear. Other: None. CT CERVICAL SPINE FINDINGS Alignment: Slight straightening of normal lordosis. No traumatic subluxation. Skull base and vertebrae: No acute fracture. Vertebral body heights are maintained. The dens and skull base are intact. Soft tissues and spinal canal: No prevertebral fluid or swelling. No visible canal hematoma. Disc levels: Disc space narrowing and endplate spurring throughout, most prominent at C5-C6. Mild scattered facet arthropathy. Upper chest: No acute findings. Other: Dense carotid calcifications. IMPRESSION: 1. No acute intracranial abnormality. No skull fracture. 2. Age related atrophy. Moderate  to advanced chronic small vessel ischemia. 3. Degenerative change in the cervical spine without acute fracture or subluxation. 4. Carotid and skullbase atherosclerosis. Electronically Signed   By: Keith Rake M.D.   On: 04/08/2019 03:32   Ct Chest W Contrast  Result Date: 04/21/2019 CLINICAL DATA:  73 year old male with a duodenal ulcer EXAM: CT CHEST, ABDOMEN, AND PELVIS WITH CONTRAST TECHNIQUE: Multidetector CT imaging of the chest, abdomen and pelvis was performed following the standard protocol during bolus administration of intravenous contrast. CONTRAST:  116mL OMNIPAQUE IOHEXOL 300 MG/ML  SOLN COMPARISON:  10/04/2016 FINDINGS: CT CHEST FINDINGS Cardiovascular: Cardiomegaly. No pericardial fluid/thickening. Dense calcifications of the left main, left anterior descending, circumflex, right coronary arteries. Normal course caliber and contour of the thoracic aorta. Patent branch vessels. Atherosclerotic changes of the aortic arch and descending thoracic aorta. No aneurysm. Unremarkable visualized pulmonary arteries. Mediastinum/Nodes: Small lymph nodes of the mediastinum. No mediastinal adenopathy. Unremarkable course of the thoracic esophagus. Unremarkable thoracic inlet. Lungs/Pleura: Partially calcified granulomas of the right lung apex, similar to the comparison CT of 04/01/2016 and 10/04/2016. No pneumothorax. No confluent airspace disease. Increasing volume of soft tissue in the lateral aspect of the right lower lobe, with large pleural attachment, largest axial dimension 4.6 cm. This was in the region of the previously biopsied and treated carcinoma. Increased nodularity on the deep margin of this plate like soft tissue. 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 There is a tail of tissue that extends to the pleura at the inferior margin at the costophrenic sulcus, as well as the tail  that appears to extend to the diaphragm. Atelectasis of the dependent aspects of the left lower lobe. CT ABDOMEN PELVIS FINDINGS Hepatobiliary: Unremarkable liver. Calcified gallstones with no inflammatory changes. Pancreas: Unremarkable pancreas Spleen: Unremarkable spleen Adrenals/Urinary Tract: Unremarkable adrenal glands. Left kidney without hydronephrosis or nephrolithiasis. Unremarkable course of the left ureter. Right kidney demonstrates nonobstructing stones in the superior collecting system, largest measuring 8 mm. Partially septated cyst of the lateral cortex. Unremarkable course of the right ureter. No hydronephrosis or inflammatory changes. Unremarkable urinary bladder. Stomach/Bowel: Stomach decompressed. In the region of the proximal duodenum/pylorus, there is gas and  fluid collection involving the wall, which likely corresponds to the ulcer described on the endoscopy report. I do not see a clear fistula to the duodenum that was described, however, duodenum is decompressed. There is no free air or focal fluid. Single small lymph node within the gastrohepatic ligament. Remainder of small bowel unremarkable, decompressed. No focal inflammatory changes. Appendix is not visualized, however, no inflammatory changes are present adjacent to the cecum to indicate an appendicitis. Colon is decompressed with no focal inflammatory changes. Colonic diverticula. Vascular/Lymphatic: Atherosclerotic changes of the abdominal aorta and the bilateral iliac arteries. No retroperitoneal adenopathy. No free fluid. No pelvic adenopathy. No inguinal adenopathy. Reproductive: Transverse diameter of the prostate measures 4.0 cm Other: Fat containing umbilical hernia. Musculoskeletal: No acute displaced fracture. No aggressive lytic lesions or sclerotic lesions. Advanced degenerative changes of the thoracolumbar spine, with flowing anterior osteophyte production of the thoracolumbar spine. Multilevel degenerative changes  through the lumbar spine with vacuum disc phenomenon at L3-L4, L4-L5 levels. IMPRESSION: CT demonstrates evidence of pyloric/duodenal ulcer that was described today on endoscopy. No evidence of free air/perforation. There is a single small lymph node within the gastrohepatic ligament, nonspecific, potentially inflammatory/reactive. 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 Referral for oncology evaluation is recommended. Increasing plate like soft tissue at the periphery of the right lower lobe, abutting the pleura, in the region of previously treated squamous cell carcinoma. Recurrence cannot be excluded. Aortic atherosclerosis and associated coronary artery disease. Aortic Atherosclerosis (ICD10-I70.0). Ancillary findings as above. Electronically Signed   By: Corrie Mckusick D.O.   On: 04/21/2019 20:28   Ct Cervical Spine Wo Contrast  Result Date: 04/20/2019 CLINICAL DATA:  Fall with headache EXAM: CT HEAD WITHOUT CONTRAST CT CERVICAL SPINE WITHOUT CONTRAST TECHNIQUE: Multidetector CT imaging of the head and cervical spine was performed following the standard protocol without intravenous contrast. Multiplanar CT image reconstructions of the cervical spine were also generated. COMPARISON:  04/08/2019 head CT FINDINGS: CT HEAD FINDINGS Brain: There is no mass, hemorrhage or extra-axial collection. There is generalized atrophy without lobar predilection. There is hypoattenuation of the periventricular white matter, most commonly indicating chronic ischemic microangiopathy. Vascular: No abnormal hyperdensity of the major intracranial arteries or dural venous sinuses. No intracranial atherosclerosis. Skull: The visualized skull base, calvarium and extracranial soft tissues are normal. Sinuses/Orbits: No fluid levels or advanced mucosal thickening of the visualized paranasal sinuses. No  mastoid or middle ear effusion. The orbits are normal. CT CERVICAL SPINE FINDINGS Alignment: No static subluxation. Facets are aligned. Occipital condyles are normally positioned. Skull base and vertebrae: No acute fracture. Soft tissues and spinal canal: No prevertebral fluid or swelling. No visible canal hematoma. Disc levels: No advanced spinal canal or neural foraminal stenosis. Upper chest: No pneumothorax, pulmonary nodule or pleural effusion. Other: Bilateral carotid bifurcation atherosclerosis. IMPRESSION: 1. Generalized atrophy and findings of chronic ischemic microangiopathy without acute intracranial abnormality. 2. No acute fracture or static subluxation of the cervical spine. Electronically Signed   By: Ulyses Jarred M.D.   On: 04/20/2019 03:10   Ct Cervical Spine Wo Contrast  Result Date: 04/08/2019 CLINICAL DATA:  Head trauma, minor, GCS>=13, high clinical risk, initial exam. Syncopal episode at home. EXAM: CT HEAD WITHOUT CONTRAST CT CERVICAL SPINE WITHOUT CONTRAST TECHNIQUE: Multidetector CT imaging of the head and cervical spine was performed following the standard protocol without intravenous contrast. Multiplanar CT image reconstructions of the cervical  spine were also generated. COMPARISON:  None. FINDINGS: CT HEAD FINDINGS Brain: No intracranial hemorrhage, mass effect, or midline shift. Age related atrophy. Moderate to advanced chronic small vessel ischemia. No hydrocephalus. The basilar cisterns are patent. No evidence of territorial infarct or acute ischemia. No extra-axial or intracranial fluid collection. Vascular: Atherosclerosis of skullbase vasculature without hyperdense vessel or abnormal calcification. Skull: No fracture or focal lesion. Sinuses/Orbits: Mucosal thickening of the ethmoid air cells. Remote medial left orbital fracture. Frontal sinuses are hypo pneumatized. Mastoid air cells are clear. Other: None. CT CERVICAL SPINE FINDINGS Alignment: Slight straightening of normal  lordosis. No traumatic subluxation. Skull base and vertebrae: No acute fracture. Vertebral body heights are maintained. The dens and skull base are intact. Soft tissues and spinal canal: No prevertebral fluid or swelling. No visible canal hematoma. Disc levels: Disc space narrowing and endplate spurring throughout, most prominent at C5-C6. Mild scattered facet arthropathy. Upper chest: No acute findings. Other: Dense carotid calcifications. IMPRESSION: 1. No acute intracranial abnormality. No skull fracture. 2. Age related atrophy. Moderate to advanced chronic small vessel ischemia. 3. Degenerative change in the cervical spine without acute fracture or subluxation. 4. Carotid and skullbase atherosclerosis. Electronically Signed   By: Keith Rake M.D.   On: 04/08/2019 03:32   Mr Jeri Cos ZO Contrast  Result Date: 04/08/2019 CLINICAL DATA:  New lung mass and seizure. EXAM: MRI HEAD WITHOUT AND WITH CONTRAST TECHNIQUE: Multiplanar, multiecho pulse sequences of the brain and surrounding structures were obtained without and with intravenous contrast. CONTRAST:  8 mL Gadavist COMPARISON:  Head CT 04/08/2019 FINDINGS: BRAIN: There is no acute infarct, acute hemorrhage or extra-axial collection. The midline structures are normal. 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 Diffuse confluent hyperintense T2-weighted signal within the periventricular, deep and juxtacortical white matter, most commonly due to chronic ischemic microangiopathy. Generalized atrophy without lobar predilection. Susceptibility-sensitive sequences show no chronic microhemorrhage or superficial siderosis. No abnormal contrast enhancement. VASCULAR: The major intracranial arterial and venous sinus flow voids are normal. SKULL AND UPPER CERVICAL SPINE: Calvarial bone marrow signal is normal. There is no skull base mass. Visualized upper cervical spine and soft tissues are normal. SINUSES/ORBITS: No fluid levels or advanced mucosal thickening. No mastoid  or middle ear effusion. The orbits are normal. IMPRESSION: 1. No intracranial metastatic disease. 2. Chronic ischemic microangiopathy. Electronically Signed   By: Ulyses Jarred M.D.   On: 04/08/2019 19:59   Ct Abdomen Pelvis W Contrast  Result Date: 04/21/2019 CLINICAL DATA:  73 year old male with a duodenal ulcer EXAM: CT CHEST, ABDOMEN, AND PELVIS WITH CONTRAST TECHNIQUE: Multidetector CT imaging of the chest, abdomen and pelvis was performed following the standard protocol during bolus administration of intravenous contrast. CONTRAST:  149mL OMNIPAQUE IOHEXOL 300 MG/ML  SOLN COMPARISON:  10/04/2016 FINDINGS: CT CHEST FINDINGS Cardiovascular: Cardiomegaly. No pericardial fluid/thickening. Dense calcifications of the left main, left anterior descending, circumflex, right coronary arteries. Normal course caliber and contour of the thoracic aorta. Patent branch vessels. Atherosclerotic changes of the aortic arch and descending thoracic aorta. No aneurysm. Unremarkable visualized pulmonary arteries. Mediastinum/Nodes: Small lymph nodes of the mediastinum. No mediastinal adenopathy. Unremarkable course of the thoracic esophagus. Unremarkable thoracic inlet. Lungs/Pleura: Partially calcified granulomas of the right lung apex, similar to the comparison CT of 04/01/2016 and 10/04/2016. No pneumothorax. No confluent airspace disease. Increasing volume of soft tissue in the lateral aspect of the right lower lobe, with large pleural attachment, largest axial dimension 4.6 cm. This was in the region of the previously biopsied and  treated carcinoma. Increased nodularity on the deep margin of this plate like soft tissue. 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 There is a tail of tissue that extends to the pleura at the inferior margin at the costophrenic sulcus, as well as the tail that appears to extend to the  diaphragm. Atelectasis of the dependent aspects of the left lower lobe. CT ABDOMEN PELVIS FINDINGS Hepatobiliary: Unremarkable liver. Calcified gallstones with no inflammatory changes. Pancreas: Unremarkable pancreas Spleen: Unremarkable spleen Adrenals/Urinary Tract: Unremarkable adrenal glands. Left kidney without hydronephrosis or nephrolithiasis. Unremarkable course of the left ureter. Right kidney demonstrates nonobstructing stones in the superior collecting system, largest measuring 8 mm. Partially septated cyst of the lateral cortex. Unremarkable course of the right ureter. No hydronephrosis or inflammatory changes. Unremarkable urinary bladder. Stomach/Bowel: Stomach decompressed. In the region of the proximal duodenum/pylorus, there is gas and fluid collection involving the wall, which likely corresponds to the ulcer described on the endoscopy report. I do not see a clear fistula to the duodenum that was described, however, duodenum is decompressed. There is no free air or focal fluid. Single small lymph node within the gastrohepatic ligament. Remainder of small bowel unremarkable, decompressed. No focal inflammatory changes. Appendix is not visualized, however, no inflammatory changes are present adjacent to the cecum to indicate an appendicitis. Colon is decompressed with no focal inflammatory changes. Colonic diverticula. Vascular/Lymphatic: Atherosclerotic changes of the abdominal aorta and the bilateral iliac arteries. No retroperitoneal adenopathy. No free fluid. No pelvic adenopathy. No inguinal adenopathy. Reproductive: Transverse diameter of the prostate measures 4.0 cm Other: Fat containing umbilical hernia. Musculoskeletal: No acute displaced fracture. No aggressive lytic lesions or sclerotic lesions. Advanced degenerative changes of the thoracolumbar spine, with flowing anterior osteophyte production of the thoracolumbar spine. Multilevel degenerative changes through the lumbar spine with vacuum  disc phenomenon at L3-L4, L4-L5 levels. IMPRESSION: CT demonstrates evidence of pyloric/duodenal ulcer that was described today on endoscopy. No evidence of free air/perforation. There is a single small lymph node within the gastrohepatic ligament, nonspecific, potentially inflammatory/reactive. 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 Referral for oncology evaluation is recommended. Increasing plate like soft tissue at the periphery of the right lower lobe, abutting the pleura, in the region of previously treated squamous cell carcinoma. Recurrence cannot be excluded. Aortic atherosclerosis and associated coronary artery disease. Aortic Atherosclerosis (ICD10-I70.0). Ancillary findings as above. Electronically Signed   By: Corrie Mckusick D.O.   On: 04/21/2019 20:28   Dg Chest Port 1 View  Result Date: 04/28/2019 CLINICAL DATA:  Generalized weakness over the last 3 days. Tremors. Syncopal episode. EXAM: PORTABLE CHEST 1 VIEW COMPARISON:  04/20/2019 FINDINGS: Borderline cardiomegaly. Mediastinal shadows are normal. The left chest is clear. 5 cm mass in the right lower lobe as seen on previous imaging. No evidence new finding. No heart failure or effusion. No significant bone abnormality. IMPRESSION: No change.  5 cm right lower lobe mass.  No other active finding. Electronically Signed   By: Nelson Chimes M.D.   On: 04/28/2019 15:46    Labs:  CBC: Recent Labs    04/23/19 0227 04/24/19 0314 04/27/19 1207 04/28/19 1529 04/29/19 0014 04/29/19 1133  WBC 10.7* 9.3 11.2* 10.8*  --   --   HGB 8.0* 7.9* 7.5 Repeated and verified X2.* 6.7* 7.3* 6.4*  HCT 26.8* 25.6* 23.8 Repeated and verified X2.* 22.6* 23.2* 20.9*  PLT 398 377 425.0* 394  --   --     COAGS: Recent Labs    04/08/19 0410 04/20/19 0147 04/28/19 1655  INR 1.3* 1.4* 1.2  APTT 25 32  --     BMP: Recent  Labs    04/23/19 0227 04/24/19 0314 04/28/19 1529 04/29/19 0014  NA 139 139 137 137  K 4.2 3.5 4.9 4.2  CL 109 106 101 103  CO2 20* 22 24 25   GLUCOSE 95 104* 132* 85  BUN 9 8 29* 31*  CALCIUM 8.6* 8.7* 9.0 8.7*  CREATININE 1.15 1.22 1.47* 1.37*  GFRNONAA >60 58* 46* 50*  GFRAA >60 >60 54* 58*    LIVER FUNCTION TESTS: Recent Labs    04/23/19 0227 04/24/19 0314 04/28/19 1529 04/29/19 0014  BILITOT 0.9 0.8 0.8 1.2  AST 23 16 31  14*  ALT 13 12 14 12   ALKPHOS 70 71 74 70  PROT 6.1* 6.3* 6.1* 5.8*  ALBUMIN 2.7* 2.6* 2.8* 2.6*    TUMOR MARKERS: No results for input(s): AFPTM, CEA, CA199, CHROMGRNA in the last 8760 hours.  Assessment and Plan:  GI bleed Duodenal ulcer Endo today:  One non-obstructing non-bleeding cratered duodenal ulcer with large adherent clot was found in the duodenal bulb, fistulous opening into gastric prepyloric area. The lesion was 12 mm in largest dimension. Scheduled now for mesenteric arteriogram with GDA embolization Risks and benefits of mesenteric arteriogram with GDA embolization were discussed with the patient's daughter Willette Cluster including, but not limited to bleeding, infection, vascular injury or contrast induced renal failure.  This interventional procedure involves the use of X-rays and because of the nature of the planned procedure, it is possible that we will have prolonged use of X-ray fluoroscopy.  Potential radiation risks to you include (but are not limited to) the following: - A slightly elevated risk for cancer  several years later in life. This risk is typically less than 0.5% percent. This risk is low in comparison to the normal incidence of human cancer, which is 33% for women and 50% for men according to the [place]. - Radiation induced injury can include skin redness, resembling a rash, tissue breakdown / ulcers and hair loss (which can be temporary or permanent).   The likelihood of either of these occurring  depends on the difficulty of the procedure and whether you are sensitive to radiation due to previous procedures, disease, or genetic conditions.   IF your procedure requires a prolonged use of radiation, you will be notified and given written instructions for further action.  It is your responsibility to monitor the irradiated area for the 2 weeks following the procedure and to notify your physician if you are concerned that you have suffered a radiation induced injury.    All of her questions were answered, she is agreeable to proceed. Consent signed and in chart.  Thank you for this interesting consult.  I greatly enjoyed meeting DERICK SEMINARA and look forward to participating in their care.  A copy of this report was sent to the requesting provider on this date.  Electronically Signed: Lavonia Drafts, PA-C 04/29/2019, 4:12 PM   I spent a total of 40 Minutes    in face to face in clinical consultation, greater than 50% of which was counseling/coordinating care for mesenteric arteriogram with GDA embolization

## 2019-04-29 NOTE — Progress Notes (Addendum)
PROGRESS NOTE    John [name]  FYB:[id number] DOB: [birth year] DOA: 04/28/2019 PCP: Patient, No Pcp Per  Brief Narrative: 73 y.o. male with medical history significant for  COPD with chronic hypoxic respiratory failure, chronic diastolic CHF, type 2 diabetes mellitus, lung cancer status post curative radiation, and 2 admissions earlier this month with symptomatic anemia due to GI bleeding, now presenting with recurrence and melena, generalized weakness, lightheadedness, and presyncope.  Patient underwent upper endoscopy 1 week ago with duodenal ulcer that had an adherent clot, ulceration in the prepyloric stomach, and esophagitis.  He was also found to have H. pylori during the recent admission.  Patient has been undergoing treatment for H. pylori, has been avoiding NSAIDs, but has developed recurrence and melena with recurrent generalized weakness, lightheadedness, and near syncope.  He denies any change in his chronic cough, denies any fevers or chills, denies chest pain, and denies any abdominal pain, nausea, or vomiting.  ED Course: Upon arrival to the ED, patient is found to be afebrile, saturating mid 90s on room air, slightly tachycardic, and with blood pressure 102/54.  EKG features a sinus rhythm and chest x-ray is notable for an unchanged 5 cm right lower lobe mass.  Chemistry panel features a creatinine 1.47, similar to priors.  CBC is notable for slight leukocytosis and a normocytic anemia with hemoglobin 6.7, down from 7.9 four days ago.  Fecal occult blood testing is positive.  Rapid COVID-19 test was sent for unknown indication and was negative.  Patient became hypotensive with systolic in the 52D and had a brief loss of consciousness while standing for orthostatic vitals.  He was given 250 cc normal saline with improvement in blood pressure.  He was started on Protonix infusion.  2 units of packed red blood cells were ordered from the ED.  Gastroenterology was consulted by the ED physician  and recommended medical admission.  Assessment & Plan:   Principal Problem:   Symptomatic anemia Active Problems:   Chronic diastolic CHF (congestive heart failure) (HCC)   Chronic respiratory failure with hypoxia (HCC)   Primary cancer of right lower lobe of lung (HCC)   CKD (chronic kidney disease), stage III (HCC)   Upper GI bleed   Helicobacter pylori gastritis    Symptomatic anemia; recurrent UGIB  - Presents with recurrence in melena, gen weakness, light headedness on standing, and pre-syncope - Found to have Hgb of 6.7 in ED, down from 7.9 four days earlier  - He had EGD during recent admission with duodenal ulcer with adherent clot, ulceration of prepyloric stomach, esophagitis  - He was also found to have H pylori and his condition was suspected secondary to NSAID's and H pylori .Marland KitchenRESTART BIAXIN AND AMOXICILLIN FOR H PYLORI TO FINISH THE COURSE. - GI is consult appreciated patient to go for EGD today. - Continue IV PPI and bowel rest, check post-transfusion CBC, follow-up GI recommendations   Patient received 1 unit of packed RBCs so far hemoglobin went up to 7.3.  2. Syncope; orthostatic hypotension  - Presents with recurrence in melena, gen weakness, light headedness on standing, and pre-syncope  - He became hypotensive and had brief LOC while standing in ED for orthostatic vitals  - Secondary to recurrent GIB  - BP improved with IVF in ED and he is now receiving RBC's  - Hold beta-blocker, address GIB as above  3. COPD; chronic hypoxic respiratory failure  -Patient uses 2 Lpm supplemental O2 at baseline, reports his chronic dyspnea is stable -  Continue ICS/LABA, supplemental O2, and as-needed albuterol   4. 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 Chronic diastolic CHF  - Appears hypovolemic in ED, was hypotensive on standing and treated with 250 cc NS  - He received 1 unit of blood transfusion so far with a hemoglobin up to 7.3. - Hold beta-blocker in setting of hypotension, follow daily weights    6. CKD III  - SCr is 1.47 on admission, similar to priors  - Renally-dose medications    7. Type II DM  - A1c was 5.8% earlier this month  - Managed with metformin at home, held on admission -Check CBG's and use SSI with Novolog as needed while in hospital  8. CAD - No anginal complaints  - Hold beta-blocker in light of hypotension and avoid antiplatelets with GIB    Estimated body mass index is 32.21 kg/m as calculated from the following:   Height as of this encounter: 5\' 5"  (1.651 m).   Weight as of this encounter: 87.8 kg.  DVT prophylaxis:SCD Code Status:FULL Family Communication:dw daughter Disposition Plan pending clinical work-up   Consultants: GI  Procedures: None Antimicrobials: Biaxin  Subjective patient resting in bed received 1 unit of packed RBC overnight staff reported he had large melanotic stools  Objective: Vitals:   04/28/19 2013 04/28/19 2204 04/29/19 0012 04/29/19 0433  BP: (!) 127/58 (!) 115/57 127/62 (!) 95/58  Pulse: 67 67 69 83  Resp:  (!) 24    Temp: 97.6 F (36.4 C) 98.6 F (37 C) 98.2 F (36.8 C) 99.1 F (37.3 C)  TempSrc: Oral Oral Oral Oral  SpO2: 100% 100% 100% 98%  Weight:    87.8 kg  Height:        Intake/Output Summary (Last 24 hours) at 04/29/2019 1138 Last data filed at 04/29/2019 0700 Gross per 24 hour  Intake 502.57 ml  Output --  Net 502.57 ml   Filed Weights   04/28/19 1457 04/28/19 2007 04/29/19 0433  Weight: 89.4 kg 86.3 kg 87.8 kg    Examination:  General exam: Appears calm and comfortable  Respiratory system: Clear to auscultation. Respiratory effort normal. Cardiovascular system: S1 & S2 heard, RRR. No  JVD, murmurs, rubs, gallops or clicks. No pedal edema. Gastrointestinal system: Abdomen is distended, soft and generalized tender. No organomegaly or masses felt. Normal bowel sounds heard. Central nervous system: Alert and oriented. No focal neurological deficits. Extremities: Symmetric 5 x 5 power. Skin: No rashes, lesions or ulcers Psychiatry: Judgement and insight appear normal. Mood & affect appropriate.     Data Reviewed: I have personally reviewed following labs and imaging studies  CBC: Recent Labs  Lab 04/23/19 0227 04/24/19 0314 04/27/19 1207 04/28/19 1529 04/29/19 0014  WBC 10.7* 9.3 11.2* 10.8*  --   NEUTROABS  --   --   --  9.0*  --   HGB 8.0* 7.9* 7.5 Repeated and verified X2.* 6.7* 7.3*  HCT 26.8* 25.6* 23.8 Repeated and verified X2.* 22.6* 23.2*  MCV 98.5 96.2 94.6 97.8  --   PLT 398 377 425.0* 394  --    Basic Metabolic Panel: Recent Labs  Lab 04/23/19 0227 04/24/19 0314 04/28/19 1529 04/28/19 1601 04/29/19 0014  NA 139 139 137  --  137  K 4.2 3.5 4.9  --  4.2  CL 109 106 101  --  103  CO2 20* 22 24  --  25  GLUCOSE 95 104* 132*  --  85  BUN 9 8 29*  --  31*  CREATININE 1.15 1.22 1.47*  --  1.37*  CALCIUM 8.6* 8.7* 9.0  --  8.7*  MG  --   --   --  1.8  --   PHOS  --   --   --  3.6  --    GFR: Estimated Creatinine Clearance: 48.2 mL/min (A) (by C-G formula based on SCr of 1.37 mg/dL (H)). Liver Function Tests: Recent Labs  Lab 04/23/19 0227 04/24/19 0314 04/28/19 1529 04/29/19 0014  AST 23 16 31  14*  ALT 13 12 14 12   ALKPHOS 70 71 74 70  BILITOT 0.9 0.8 0.8 1.2  PROT 6.1* 6.3* 6.1* 5.8*  ALBUMIN 2.7* 2.6* 2.8* 2.6*   No results for input(s): LIPASE, AMYLASE in the last 168 hours. No results for input(s): AMMONIA in the last 168 hours. Coagulation Profile: Recent Labs  Lab 04/28/19 1655  INR 1.2   Cardiac Enzymes: Recent Labs  Lab 04/28/19 1529  TROPONINI <0.03   BNP (last 3 results) No results for input(s): PROBNP in the last  8760 hours. HbA1C: No results for input(s): HGBA1C in the last 72 hours. CBG: Recent Labs  Lab 04/24/19 0806 04/28/19 2010 04/29/19 0013 04/29/19 0436 04/29/19 0750  GLUCAP 116* 96 88 106* 114*   Lipid Profile: No results for input(s): CHOL, HDL, LDLCALC, TRIG, CHOLHDL, LDLDIRECT in the last 72 hours. Thyroid Function Tests: No results for input(s): TSH, T4TOTAL, FREET4, T3FREE, THYROIDAB in the last 72 hours. Anemia Panel: No results for input(s): VITAMINB12, FOLATE, FERRITIN, TIBC, IRON, RETICCTPCT in the last 72 hours. Sepsis Labs: No results for input(s): PROCALCITON, LATICACIDVEN in the last 168 hours.  Recent Results (from the past 240 hour(s))  MRSA PCR Screening     Status: None   Collection Time: 04/20/19  5:10 AM  Result Value Ref Range Status   MRSA by PCR NEGATIVE NEGATIVE Final    Comment:        The GeneXpert MRSA Assay (FDA approved for NASAL specimens only), is one component of a comprehensive MRSA colonization surveillance program. It is not intended to diagnose MRSA infection nor to guide or monitor treatment for MRSA infections. Performed at [facility], [place] [address] [place], [place] [address]   SARS Coronavirus 2 [facility] order, Performed in [facility] hospital lab)     Status: None   Collection Time: 04/28/19  4:54 PM  Result Value Ref Range Status   SARS Coronavirus 2 NEGATIVE NEGATIVE Final    Comment: (NOTE) If result is NEGATIVE SARS-CoV-2 target nucleic acids are NOT DETECTED. The SARS-CoV-2 RNA is generally detectable in upper and lower  respiratory specimens during the acute phase of infection. The lowest  concentration of SARS-CoV-2 viral copies this assay can detect is 250  copies / mL. A negative result does not preclude SARS-CoV-2 infection  and should not be used as the sole basis for treatment or other  patient management decisions.  A negative result may occur with  improper specimen collection / handling, submission  of specimen other  than nasopharyngeal swab, presence of viral mutation(s) within the  areas targeted by this assay, and inadequate number of viral copies  (<250 copies / mL). A negative result must be combined with  clinical  observations, patient history, and epidemiological information. If result is POSITIVE SARS-CoV-2 target nucleic acids are DETECTED. The SARS-CoV-2 RNA is generally detectable in upper and lower  respiratory specimens dur ing the acute phase of infection.  Positive  results are indicative of active infection with SARS-CoV-2.  Clinical  correlation with patient history and other diagnostic information is  necessary to determine patient infection status.  Positive results do  not rule out bacterial infection or co-infection with other viruses. If result is PRESUMPTIVE POSTIVE SARS-CoV-2 nucleic acids MAY BE PRESENT.   A presumptive positive result was obtained on the submitted specimen  and confirmed on repeat testing.  While 2019 novel coronavirus  (SARS-CoV-2) nucleic acids may be present in the submitted sample  additional confirmatory testing may be necessary for epidemiological  and / or clinical management purposes  to differentiate between  SARS-CoV-2 and other Sarbecovirus currently known to infect humans.  If clinically indicated additional testing with an alternate test  methodology [phone]) is advised. The SARS-CoV-2 RNA is generally  detectable in upper and lower respiratory sp ecimens during the acute  phase of infection. The expected result is Negative. Fact Sheet for Patients:  StrictlyIdeas.no Fact Sheet for Healthcare Providers: BankingDealers.co.za This test is not yet approved or cleared by the [place] FDA and has been authorized for detection and/or diagnosis of SARS-CoV-2 by FDA under an Emergency Use Authorization (EUA).  This EUA will remain in effect (meaning this test can be used) for  the duration of the COVID-19 declaration under Section 564(b)(1) of the Act, 21 U.S.C. section 360bbb-3(b)(1), unless the authorization is terminated or revoked sooner. Performed at [facility], [place] [address], [place], [place] [address]          Radiology Studies: Dg Chest Port 1 View  Result Date: 04/28/2019 CLINICAL DATA:  Generalized weakness over the last 3 days. Tremors. Syncopal episode. EXAM: PORTABLE CHEST 1 VIEW COMPARISON:  04/20/2019 FINDINGS: Borderline cardiomegaly. Mediastinal shadows are normal. The left chest is clear. 5 cm mass in the right lower lobe as seen on previous imaging. No evidence new finding. No heart failure or effusion. No significant bone abnormality. IMPRESSION: No change.  5 cm right lower lobe mass.  No other active finding. Electronically Signed   By: Nelson Chimes M.D.   On: 04/28/2019 15:46        Scheduled Meds:  sodium chloride   Intravenous Once   insulin aspart  0-7 Units Subcutaneous Q4H   mometasone-formoterol  2 puff Inhalation BID   sodium chloride flush  3 mL Intravenous Q12H   sodium chloride flush  3 mL Intravenous Q12H   Continuous Infusions:  sodium chloride     pantoprozole (PROTONIX) infusion 8 mg/hr (04/29/19 0700)     LOS: 0 days     Georgette Shell, MD [facility]  If 7PM-7AM, please contact night-coverage [email or web address] Password [facility] 04/29/2019, 11:38 AM

## 2019-04-29 NOTE — Transfer of Care (Signed)
Immediate Anesthesia Transfer of Care Note  Patient: John [name]  Procedure(s) Performed: ESOPHAGOGASTRODUODENOSCOPY (EGD) WITH PROPOFOL (N/A ) HEMOSTASIS CLIP PLACEMENT  Patient Location: PACU  Anesthesia Type:General  Level of Consciousness: awake, alert  and oriented  Airway & Oxygen Therapy: Patient Spontanous Breathing and Patient connected to face mask oxygen  Post-op Assessment: Report given to RN and Post -op Vital signs reviewed and stable  Post vital signs: Reviewed and stable  Last Vitals:  Vitals Value Taken Time  BP 141/76 04/29/2019  7:19 PM  Temp    Pulse 108 04/29/2019  7:21 PM  Resp 21 04/29/2019  7:21 PM  SpO2 94 % 04/29/2019  7:21 PM  Vitals shown include unvalidated device data.  Last Pain:  Vitals:   04/29/19 1604  TempSrc: Axillary  PainSc:       Patients Stated Pain Goal: 0 (43/83/[id number])  Complications: No apparent anesthesia complications

## 2019-04-29 NOTE — Progress Notes (Signed)
CRITICAL VALUE ALERT  Critical Value:  Hemoglobin=6.4  Date & Time Notied: 04/29/2019 at 41   Provider Notified: Dr Rodena Piety   Orders Received/Actions taken: stated will order transfusion

## 2019-04-29 NOTE — Sedation Documentation (Signed)
Anesthesia to monitor and record V/S

## 2019-04-29 NOTE — Anesthesia Procedure Notes (Signed)
Procedure Name: Intubation Date/Time: 04/29/2019 3:21 PM Performed by: Eligha Bridegroom, CRNA Pre-anesthesia Checklist: Patient identified, Emergency Drugs available, Suction available, Patient being monitored and Timeout performed Patient Re-evaluated:Patient Re-evaluated prior to induction Oxygen Delivery Method: Circle system utilized Preoxygenation: Pre-oxygenation with 100% oxygen Induction Type: IV induction Laryngoscope Size: Mac and 4 Grade View: Grade I Tube type: Oral Tube size: 8.0 mm Number of attempts: 1 Airway Equipment and Method: Stylet

## 2019-04-29 NOTE — Procedures (Signed)
Interventional Radiology Procedure Note  Procedure: Celiac, common hepatic, GDA arteriography and embolization of GDA   Complications: None  Estimated Blood Loss: < 10 mL  Findings: Arteriography demonstrates pseudoaneurysm of duodenal branch of GDA next to hemostatic clip placed at time of endoscopy. Small supplying branch could not be selectively catheterized so long segment of GDA coiled, including across the origin of duodenal branch supplying the pseudoaneurysm. GDA and supply to pseudoaneurysm successfully occluded.  Venetia Night. Kathlene Cote, M.D Pager:  [phone]

## 2019-04-29 NOTE — Progress Notes (Signed)
Pt with Hgb=7.3 post 1 unit PRBC transfusion. As per NP Baltazar Najjar to hold transfusion of 2nd unit of PRBC & to follow as per protocol from blood bank.

## 2019-04-29 NOTE — Progress Notes (Signed)
Called his daughter Kingsly Kloepfer [phone] and updated her re his condition and EGD findings.  Plan to proceed with GDA embolization by IR to prevent  Recurrent bleeding. She is in agreement.   Damaris Hippo , MD

## 2019-04-29 NOTE — Anesthesia Preprocedure Evaluation (Signed)
Anesthesia Evaluation  Patient identified by MRN, date of birth, ID band Patient awake    Reviewed: Allergy & Precautions, NPO status , Patient's Chart, lab work & pertinent test results  Airway Mallampati: II  TM Distance: >3 FB Neck ROM: Full    Dental no notable dental hx. (+) Teeth Intact   Pulmonary COPD,  oxygen dependent, former smoker,  Hx of Lung CA   Pulmonary exam normal breath sounds clear to auscultation       Cardiovascular Exercise Tolerance: Poor hypertension, Pt. on medications + CAD and +CHF  Normal cardiovascular exam Rhythm:Regular Rate:Normal     Neuro/Psych    GI/Hepatic PUD,   Endo/Other  diabetes  Renal/GU      Musculoskeletal   Abdominal (+) + obese,   Peds  Hematology  (+) anemia ,   Anesthesia Other Findings   Reproductive/Obstetrics                             Lab Results  Component Value Date   CREATININE 1.37 (H) 04/29/2019   BUN 31 (H) 04/29/2019   NA 137 04/29/2019   K 4.2 04/29/2019   CL 103 04/29/2019   CO2 25 04/29/2019    Lab Results  Component Value Date   WBC 10.8 (H) 04/28/2019   HGB 6.4 (LL) 04/29/2019   HCT 20.9 (L) 04/29/2019   MCV 97.8 04/28/2019   PLT 394 04/28/2019    Anesthesia Physical Anesthesia Plan  ASA: IV  Anesthesia Plan: General   Post-op Pain Management:    Induction: Intravenous, Rapid sequence and Cricoid pressure planned  PONV Risk Score and Plan: 2 and Treatment may vary due to age or medical condition, Ondansetron and Dexamethasone  Airway Management Planned: Oral ETT  Additional Equipment:   Intra-op Plan:   Post-operative Plan: Extubation in OR  Informed Consent: I have reviewed the patients History and Physical, chart, labs and discussed the procedure including the risks, benefits and alternatives for the proposed anesthesia with the patient or authorized representative who has indicated his/her  understanding and acceptance.     Dental advisory given  Plan Discussed with:   Anesthesia Plan Comments:         Anesthesia Quick Evaluation

## 2019-04-29 NOTE — Op Note (Signed)
[facility] Patient Name: John [name] Procedure Date : 04/29/2019 MRN: [id number] Attending MD: Mauri Pole , MD Date of Birth: Mar 11, 1946 CSN: [id number] Age: 73 Admit Type: Inpatient Procedure:                Upper GI endoscopy Indications:              Active gastrointestinal bleeding. 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 Estimated Blood Loss:     Estimated blood loss was minimal. Procedure:                Pre-Anesthesia Assessment:                           - Prior to the procedure, a History and Physical                            was performed, and patient medications and                            allergies were reviewed. The patient's tolerance of                            previous anesthesia was also reviewed. The risks                            and benefits of the procedure and the sedation                            options and risks were discussed with the patient.                            All questions were answered, and informed consent                            was obtained. Prior Anticoagulants: The patient has                            taken no previous anticoagulant or antiplatelet  agents. ASA Grade Assessment: IV - A patient with                            severe systemic disease that is a constant threat                            to life. After reviewing the risks and benefits,                            the patient was deemed in satisfactory condition to                            undergo the procedure.     After obtaining informed consent, the endoscope was                            passed under direct vision. Throughout the                            procedure, the patient's blood pressure, pulse, and                            oxygen saturations were monitored continuously. The                            GIF-H190 ([id number] Olympus gastroscope was                            introduced through the mouth, and advanced to the                            second part of duodenum. The upper GI endoscopy was                            accomplished without difficulty. The patient                            tolerated the procedure well. Scope In: Scope Out: Findings:      The esophagus was normal.      Clotted blood was found in the gastric body extending to fundus.      One non-obstructing non-bleeding cratered duodenal ulcer with large       adherent clot was found in the duodenal bulb, fistulous opening into       gastric prepyloric area. The lesion was 12 mm in largest dimension.       There is no evidence of perforation. For location marking, one       hemostatic clip was successfully placed (MR conditional).      The second portion of the duodenum was normal. Impression:               - Normal esophagus.                           - Clotted blood in the gastric body.                           -  Non-obstructing non-bleeding duodenal ulcer with                            adherent clot. NSAID induced etiology. There is no                            evidence of perforation. Clip (MR conditional) was                            placed.                           - Normal second portion of the duodenum.                           - No specimens collected. Recommendation:           - NPO.                           - Continue present medications.                           - Discussed with interventional radiology re                            prohylactic GDA embolization (endoclip placed for                             location marking)                           - PPI gtt Procedure Code(s):        --- Professional ---                           [phone], Esophagogastroduodenoscopy, flexible,                            transoral; diagnostic, including collection of                            specimen(s) by brushing or washing, when performed                            (separate procedure)                           44799, Unlisted procedure, small intestine Diagnosis Code(s):        --- Professional ---                           K92.2, Gastrointestinal hemorrhage, unspecified                           T39.395S, Adverse effect of other nonsteroidal  anti-inflammatory drugs [NSAID], sequela                           K26.4, Chronic or unspecified duodenal ulcer with                            hemorrhage CPT copyright 2019 [employer]. All rights reserved. The codes documented in this report are preliminary and upon coder review may  be revised to meet current compliance requirements. Mauri Pole, MD 04/29/2019 4:08:16 PM This report has been signed electronically. Number of Addenda: 0

## 2019-04-29 NOTE — Progress Notes (Signed)
Case Ended in Endo at 1536. Nandigam MD speaking with IR about pt coming to IR. IR PA coming to Endo unit to evaluate patient. Houser MD and Charge CRNA aware.

## 2019-04-30 ENCOUNTER — Encounter (HOSPITAL_COMMUNITY): Payer: Self-pay | Admitting: Interventional Radiology

## 2019-04-30 DIAGNOSIS — F0150 Vascular dementia without behavioral disturbance: Secondary | ICD-10-CM

## 2019-04-30 DIAGNOSIS — D62 Acute posthemorrhagic anemia: Secondary | ICD-10-CM

## 2019-04-30 DIAGNOSIS — E119 Type 2 diabetes mellitus without complications: Secondary | ICD-10-CM

## 2019-04-30 LAB — GLUCOSE, CAPILLARY
Glucose-Capillary: 106 mg/dL — ABNORMAL HIGH (ref 70–99)
Glucose-Capillary: 107 mg/dL — ABNORMAL HIGH (ref 70–99)
Glucose-Capillary: 111 mg/dL — ABNORMAL HIGH (ref 70–99)
Glucose-Capillary: 120 mg/dL — ABNORMAL HIGH (ref 70–99)
Glucose-Capillary: 132 mg/dL — ABNORMAL HIGH (ref 70–99)
Glucose-Capillary: 96 mg/dL (ref 70–99)
Glucose-Capillary: 97 mg/dL (ref 70–99)

## 2019-04-30 LAB — TYPE AND SCREEN
ABO/RH(D): O POS
Antibody Screen: NEGATIVE
Unit division: 0
Unit division: 0
Unit division: 0
Unit division: 0

## 2019-04-30 LAB — BPAM RBC
Blood Product Expiration Date: 202005052359
Blood Product Expiration Date: 202005052359
Blood Product Expiration Date: 202005062359
Blood Product Expiration Date: 202005062359
ISSUE DATE / TIME: 202004291852
ISSUE DATE / TIME: 202004301400
ISSUE DATE / TIME: 202004301808
ISSUE DATE / TIME: 202004301808
Unit Type and Rh: 5100
Unit Type and Rh: 5100
Unit Type and Rh: 5100
Unit Type and Rh: 5100

## 2019-04-30 LAB — CBC
HCT: 26.9 % — ABNORMAL LOW (ref 39.0–52.0)
Hemoglobin: 8.8 g/dL — ABNORMAL LOW (ref 13.0–17.0)
MCH: 29.4 pg (ref 26.0–34.0)
MCHC: 32.7 g/dL (ref 30.0–36.0)
MCV: 90 fL (ref 80.0–100.0)
Platelets: 262 10*3/uL (ref 150–400)
RBC: 2.99 MIL/uL — ABNORMAL LOW (ref 4.22–5.81)
RDW: 17.3 % — ABNORMAL HIGH (ref 11.5–15.5)
WBC: 12.4 10*3/uL — ABNORMAL HIGH (ref 4.0–10.5)
nRBC: 0.8 % — ABNORMAL HIGH (ref 0.0–0.2)

## 2019-04-30 LAB — BASIC METABOLIC PANEL
Anion gap: 11 (ref 5–15)
BUN: 28 mg/dL — ABNORMAL HIGH (ref 8–23)
CO2: 21 mmol/L — ABNORMAL LOW (ref 22–32)
Calcium: 8.8 mg/dL — ABNORMAL LOW (ref 8.9–10.3)
Chloride: 109 mmol/L (ref 98–111)
Creatinine, Ser: 1.42 mg/dL — ABNORMAL HIGH (ref 0.61–1.24)
GFR calc Af Amer: 56 mL/min — ABNORMAL LOW (ref >60)
GFR calc non Af Amer: 48 mL/min — ABNORMAL LOW (ref >60)
Glucose, Bld: 96 mg/dL (ref 70–99)
Potassium: 4.3 mmol/L (ref 3.5–5.1)
Sodium: 141 mmol/L (ref 135–145)

## 2019-04-30 NOTE — Progress Notes (Signed)
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 Small supplying branch could not be selectively catheterized so long segment of GDA coiled, including across the origin of duodenal branch supplying the pseudoaneurysm. GDA and supply to pseudoaneurysm successfully occluded.  Pt is up at side of bed this am Good spirits -- feeling good Denies pain Denies N/V  Hg 8.8 this am-- stable    Allergies: Levaquin [levofloxacin in d5w] and Lisinopril  Medications: Prior to Admission medications   Medication Sig Start Date End Date Taking? Authorizing Provider  albuterol (PROAIR HFA) 108 (90 BASE) MCG/ACT inhaler INHALE 2 PUFFS BY MOUTH EVERY 4 HOURS AS NEEDED FOR WHEEZING Patient taking differently: Inhale 2 puffs into the lungs every 4 (four) hours as needed for wheezing.  03/13/15  Yes Tanda Rockers, MD  allopurinol (ZYLOPRIM) 300 MG tablet Take 300 mg by mouth daily. 04/07/19  Yes [provider]  amoxicillin (AMOXIL) 500 MG capsule Take 2 capsules (1,000 mg total) by mouth every 12 (twelve) hours for 13 days. 04/24/19 05/07/19 Yes Danford, Suann Larry, MD  atorvastatin (LIPITOR) 20 MG tablet Take 1 tablet (20 mg total) by mouth daily. 09/26/16  Yes Tanda Rockers, MD  bisoprolol (ZEBETA) 5 MG tablet Take 1 tablet (5 mg total) by mouth daily. 09/26/16  Yes Tanda Rockers, MD  budesonide-formoterol (SYMBICORT) 160-4.5 MCG/ACT inhaler INHALE 2 PUFFS BY MOUTH EVERY 12 HOURS (FIRST THING IN THE MORNING THEN 12 HOURS LATER) Patient taking differently: Inhale 2 puffs  into the lungs 2 (two) times daily as needed (SOB).  03/24/17  Yes Tanda Rockers, MD  clarithromycin (BIAXIN) 500 MG tablet Take 1 tablet (500 mg total) by mouth every 12 (twelve) hours for 13 days. 04/24/19 05/07/19 Yes Danford, Suann Larry, MD  folic acid (FOLVITE) 1 MG tablet Take 1 tablet (1 mg total) by mouth daily. 04/09/19 04/08/20 Yes Donne Hazel, MD  furosemide (LASIX) 20 MG tablet Take 1 tablet (20 mg total) by mouth daily. 12/17/16  Yes Tanda Rockers, MD  metFORMIN (GLUCOPHAGE) 1000 MG tablet Take 1,000 mg by mouth 2 (two) times daily. 04/07/19  Yes [provider]  pantoprazole (PROTONIX) 40 MG tablet Take 1 tablet (40 mg total) by mouth 2 (two) times daily before a meal. 04/24/19  Yes Danford, Suann Larry, MD  vitamin B-12 (CYANOCOBALAMIN) 1000 MCG tablet Take 1 tablet (1,000 mcg total) by mouth daily for 30 days. 04/09/19 05/09/19 Yes Donne Hazel, MD  Vitamin D, Ergocalciferol, (DRISDOL) 1.25 MG (50000 UT) CAPS capsule Take 1 capsule by mouth every Friday.  03/10/19  Yes [provider]     Vital Signs: BP 129/72 (BP Location: Right Arm)    Pulse (!) 102    Temp 98 F (36.7 C) (Oral)    Resp (!) 21    Ht 5\' 5"  (1.651 m)    Wt 191 lb 12.8 oz (87 kg)    SpO2 97%    BMI 31.92 kg/m   Physical Exam Vitals signs reviewed.  Skin:    General: Skin is warm and dry.     Comments: Right  groin is soft NT no bleeding No hematoma Rt foot 1+ pulses  Neurological:     Mental Status: He is alert.     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 Status post endoscopy with placement of single hemostatic clip at the location of most significant here into blood clot. The patient presents for arteriography with probable embolization blood supply to the region of the bleeding ulcer. MEDICATIONS: None ANESTHESIA/SEDATION:  The patient was intubated and monitored by Anesthesia during the procedure. He had been intubated for endoscopy immediately prior to the arteriographic procedure and was transported to IR directly from Endoscopy. CONTRAST:  50 mL Omnipaque 300 FLUOROSCOPY TIME:  Fluoroscopy Time: 16 minutes and 54 seconds. 1208 mGy. COMPLICATIONS: None immediate. PROCEDURE: Informed consent was obtained from the patient's daughter following explanation of the procedure, risks, benefits and alternatives. The patient's daughter understands, agrees and consents for the procedure. All questions were addressed. A time out was performed prior to the initiation of the procedure. Maximal barrier sterile technique utilized including caps, mask, sterile gowns, sterile gloves, large sterile drape, hand hygiene, and chlorhexidine prep. Ultrasound was utilized to confirm patency of the right common femoral artery. Local anesthesia was provided with 1% lidocaine. The right common femoral artery was accessed utilizing a 21 gauge needle and micropuncture set under ultrasound guidance. Ultrasound image documentation was performed. A 5 French sheath was placed over a guidewire. A 5 French Cobra catheter was advanced into the abdominal aorta. Selective arteriography was initially performed of a trunk off of the aorta supplying the right inferior phrenic artery. The catheter was then used to selectively catheterize the celiac axis. Selective arteriography was performed of the celiac axis. The 5 French catheter was further advanced into the common hepatic artery and selective arteriography performed. A microcatheter was then advanced through the 5 French catheter into the gastroduodenal artery and selective arteriography performed. Attempt was made to catheterize a branch of the gastroduodenal artery. Transcatheter embolization was performed of the gastroduodenal artery via the microcatheter with placement of multiple embolization coils. A total of 8  Interlock 0.018 inch microcoils ranging in diameter from 4 mm up to 6 mm of various lengths were placed. Periodic arteriography was performed through the microcatheter during coil embolization. Additional arteriography was then performed at the level of the common hepatic artery via the 5 French Cobra catheter. The catheter was removed. Oblique arteriography was performed at the level of the common femoral access via the sheath. Femoral closure was performed with the Cordis ExoSeal device. FINDINGS: Initial catheterization was performed of a trunk just adjacent to the celiac trunk supplying the right inferior phrenic artery and an additional left-sided branch that appears to represent an accessory gastric artery. Celiac arteriography demonstrates typical branching pattern with patent splenic, left gastric and common hepatic arteries identified. The gastroduodenal artery emanates off of the common hepatic artery and normally patent proper, right and left hepatic arteries are identified. Common hepatic arteriography demonstrates typical anatomy of the gastroduodenal artery as well as a gastroepiploic artery. In an oblique projection, there was evidence of an arterial pseudoaneurysm of a gastroduodenal branch representing a transverse proximal branch of the GDA supplying the region of the duodenal bulb/distal stomach. This pseudoaneurysm is located along the lateral aspect of the hemostatic clip placed at the time of endoscopy. Due to transverse takeoff of the arterial branch and small caliber of the branch,  a microcatheter could not be advanced into the duodenal branch to allow selective embolization of the branch supplying the pseudoaneurysm. Embolization was therefore performed of the gastroduodenal artery itself including across the origin of the offending branch. Gastroduodenal arteriography demonstrates additional normal duodenal branches and a normal gastroepiploic artery. Embolization of the gastroduodenal  trunk was performed successfully nearly to the orifice of the GDA. This resulted in complete occlusion of the artery including the transverse duodenal branch supplying the arterial pseudoaneurysm. There was preserved flow in the hepatic arteries. 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 This is located immediately adjacent to the hemostatic clip placed at the time of endoscopy. The gastroduodenal artery was successfully occluded with embolization coils including across the origin of the abnormal proximal duodenal branch resulting in complete occlusion of the branch supplying the pseudoaneurysm. Electronically Signed   By: Aletta Edouard M.D.   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 Status post endoscopy with placement of single hemostatic clip at the location of most significant here into blood clot. The patient presents for arteriography with probable embolization blood supply to the region of the bleeding ulcer. MEDICATIONS: None ANESTHESIA/SEDATION: The patient was intubated and monitored by Anesthesia during the procedure. He had been intubated for endoscopy immediately prior to the arteriographic procedure and was transported to IR directly from Endoscopy. CONTRAST:  50 mL Omnipaque 300 FLUOROSCOPY TIME:  Fluoroscopy Time: 16 minutes and 54 seconds. 1208 mGy. COMPLICATIONS: None immediate. PROCEDURE: Informed consent was obtained from the patient's daughter following explanation of the procedure, risks, benefits and alternatives. The patient's daughter understands, agrees and consents for the procedure. All questions were addressed. A time out was performed prior to the  initiation of the procedure. Maximal barrier sterile technique utilized including caps, mask, sterile gowns, sterile gloves, large sterile drape, hand hygiene, and chlorhexidine prep. Ultrasound was utilized to confirm patency of the right common femoral artery. Local anesthesia was provided with 1% lidocaine. The right common femoral artery was accessed utilizing a 21 gauge needle and micropuncture set under ultrasound guidance. Ultrasound image documentation was performed. A 5 French sheath was placed over a guidewire. A 5 French Cobra catheter was advanced into the abdominal aorta. Selective arteriography was initially performed of a trunk off of the aorta supplying the right inferior phrenic artery. The catheter was then used to selectively catheterize the celiac axis. Selective arteriography was performed of the celiac axis. The 5 French catheter was further advanced into the common hepatic artery and selective arteriography performed. A microcatheter was then advanced through the 5 French catheter into the gastroduodenal artery and selective arteriography performed. Attempt was made to catheterize a branch of the gastroduodenal artery. Transcatheter embolization was performed of the gastroduodenal artery via the microcatheter with placement of multiple embolization coils. A total of 8 Interlock 0.018 inch microcoils ranging in diameter from 4 mm up to 6 mm of various lengths were placed. Periodic arteriography was performed through the microcatheter during coil embolization. Additional arteriography was then performed at the level of the common hepatic artery via the 5 French Cobra catheter. The catheter was removed. Oblique arteriography was performed at the level of the common femoral access via the sheath. Femoral closure was performed with the Cordis ExoSeal device. FINDINGS: Initial  catheterization was performed of a trunk just adjacent to the celiac trunk supplying the right inferior phrenic artery and  an additional left-sided branch that appears to represent an accessory gastric artery. Celiac arteriography demonstrates typical branching pattern with patent splenic, left gastric and common hepatic arteries identified. The gastroduodenal artery emanates off of the common hepatic artery and normally patent proper, right and left hepatic arteries are identified. Common hepatic arteriography demonstrates typical anatomy of the gastroduodenal artery as well as a gastroepiploic artery. In an oblique projection, there was evidence of an arterial pseudoaneurysm of a gastroduodenal branch representing a transverse proximal branch of the GDA supplying the region of the duodenal bulb/distal stomach. This pseudoaneurysm is located along the lateral aspect of the hemostatic clip placed at the time of endoscopy. Due to transverse takeoff of the arterial branch and small caliber of the branch, a microcatheter could not be advanced into the duodenal branch to allow selective embolization of the branch supplying the pseudoaneurysm. Embolization was therefore performed of the gastroduodenal artery itself including across the origin of the offending branch. Gastroduodenal arteriography demonstrates additional normal duodenal branches and a normal gastroepiploic artery. Embolization of the gastroduodenal trunk was performed successfully nearly to the orifice of the GDA. This resulted in complete occlusion of the artery including the transverse duodenal branch supplying the arterial pseudoaneurysm. There was preserved flow in the hepatic arteries. 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 This is located immediately adjacent to the hemostatic clip placed at the time of endoscopy. The gastroduodenal artery was successfully occluded with embolization coils including across the  origin of the abnormal proximal duodenal branch resulting in complete occlusion of the branch supplying the pseudoaneurysm. Electronically Signed   By: Aletta Edouard M.D.   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 Status post endoscopy with placement of single hemostatic clip at the location of most significant here into blood clot. The patient presents for arteriography with probable embolization blood supply to the region of the bleeding ulcer. MEDICATIONS: None ANESTHESIA/SEDATION: The patient was intubated and monitored by Anesthesia during the procedure. He had been intubated for endoscopy immediately prior to the arteriographic procedure and was transported to IR directly from Endoscopy. CONTRAST:  50 mL Omnipaque 300 FLUOROSCOPY TIME:  Fluoroscopy Time: 16 minutes and 54 seconds. 1208 mGy. COMPLICATIONS: None immediate. PROCEDURE: Informed consent was obtained from the patient's daughter following explanation of the procedure, risks, benefits and alternatives. The patient's daughter understands, agrees and consents for the procedure. All questions were addressed. A time out was performed prior to the initiation of the procedure. Maximal barrier sterile technique utilized including caps, mask, sterile gowns, sterile gloves, large sterile drape, hand hygiene, and chlorhexidine prep. Ultrasound was utilized to confirm patency of the right common femoral artery. Local anesthesia was provided with 1% lidocaine. The right common femoral artery was accessed utilizing a 21 gauge needle and micropuncture set under ultrasound guidance. Ultrasound image documentation was performed. A 5 French sheath was placed over a guidewire. A 5 French Cobra catheter was advanced into the abdominal aorta. Selective arteriography was initially  performed of a trunk off of the aorta supplying the right inferior phrenic artery. The catheter was then used to selectively catheterize the celiac axis. Selective arteriography was performed  of the celiac axis. The 5 French catheter was further advanced into the common hepatic artery and selective arteriography performed. A microcatheter was then advanced through the 5 French catheter into the gastroduodenal artery and selective arteriography performed. Attempt was made to catheterize a branch of the gastroduodenal artery. Transcatheter embolization was performed of the gastroduodenal artery via the microcatheter with placement of multiple embolization coils. A total of 8 Interlock 0.018 inch microcoils ranging in diameter from 4 mm up to 6 mm of various lengths were placed. Periodic arteriography was performed through the microcatheter during coil embolization. Additional arteriography was then performed at the level of the common hepatic artery via the 5 French Cobra catheter. The catheter was removed. Oblique arteriography was performed at the level of the common femoral access via the sheath. Femoral closure was performed with the Cordis ExoSeal device. FINDINGS: Initial catheterization was performed of a trunk just adjacent to the celiac trunk supplying the right inferior phrenic artery and an additional left-sided branch that appears to represent an accessory gastric artery. Celiac arteriography demonstrates typical branching pattern with patent splenic, left gastric and common hepatic arteries identified. The gastroduodenal artery emanates off of the common hepatic artery and normally patent proper, right and left hepatic arteries are identified. Common hepatic arteriography demonstrates typical anatomy of the gastroduodenal artery as well as a gastroepiploic artery. In an oblique projection, there was evidence of an arterial pseudoaneurysm of a gastroduodenal branch representing a transverse proximal branch  of the GDA supplying the region of the duodenal bulb/distal stomach. This pseudoaneurysm is located along the lateral aspect of the hemostatic clip placed at the time of endoscopy. Due to transverse takeoff of the arterial branch and small caliber of the branch, a microcatheter could not be advanced into the duodenal branch to allow selective embolization of the branch supplying the pseudoaneurysm. Embolization was therefore performed of the gastroduodenal artery itself including across the origin of the offending branch. Gastroduodenal arteriography demonstrates additional normal duodenal branches and a normal gastroepiploic artery. Embolization of the gastroduodenal trunk was performed successfully nearly to the orifice of the GDA. This resulted in complete occlusion of the artery including the transverse duodenal branch supplying the arterial pseudoaneurysm. There was preserved flow in the hepatic arteries. 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 This is located immediately adjacent to the hemostatic clip placed at the time of endoscopy. The gastroduodenal artery was successfully occluded with embolization coils including across the origin of the abnormal proximal duodenal branch resulting in complete occlusion of the branch supplying the pseudoaneurysm. Electronically Signed   By: Aletta Edouard M.D.   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 Status post endoscopy with placement of single hemostatic clip at the location of most significant here into blood clot. The patient presents for arteriography with probable embolization blood supply to the region of the bleeding  ulcer. MEDICATIONS: None ANESTHESIA/SEDATION: The patient was intubated and monitored by Anesthesia during the procedure. He had been intubated for endoscopy immediately prior to the arteriographic procedure and was transported to IR directly from Endoscopy. CONTRAST:  50 mL Omnipaque 300 FLUOROSCOPY TIME:  Fluoroscopy Time: 16 minutes and 54 seconds. 1208 mGy. COMPLICATIONS: None immediate. PROCEDURE: Informed consent was obtained from the patient's daughter following explanation of  the procedure, risks, benefits and alternatives. The patient's daughter understands, agrees and consents for the procedure. All questions were addressed. A time out was performed prior to the initiation of the procedure. Maximal barrier sterile technique utilized including caps, mask, sterile gowns, sterile gloves, large sterile drape, hand hygiene, and chlorhexidine prep. Ultrasound was utilized to confirm patency of the right common femoral artery. Local anesthesia was provided with 1% lidocaine. The right common femoral artery was accessed utilizing a 21 gauge needle and micropuncture set under ultrasound guidance. Ultrasound image documentation was performed. A 5 French sheath was placed over a guidewire. A 5 French Cobra catheter was advanced into the abdominal aorta. Selective arteriography was initially performed of a trunk off of the aorta supplying the right inferior phrenic artery. The catheter was then used to selectively catheterize the celiac axis. Selective arteriography was performed of the celiac axis. The 5 French catheter was further advanced into the common hepatic artery and selective arteriography performed. A microcatheter was then advanced through the 5 French catheter into the gastroduodenal artery and selective arteriography performed. Attempt was made to catheterize a branch of the gastroduodenal artery. Transcatheter embolization was performed of the gastroduodenal artery via the microcatheter with placement  of multiple embolization coils. A total of 8 Interlock 0.018 inch microcoils ranging in diameter from 4 mm up to 6 mm of various lengths were placed. Periodic arteriography was performed through the microcatheter during coil embolization. Additional arteriography was then performed at the level of the common hepatic artery via the 5 French Cobra catheter. The catheter was removed. Oblique arteriography was performed at the level of the common femoral access via the sheath. Femoral closure was performed with the Cordis ExoSeal device. FINDINGS: Initial catheterization was performed of a trunk just adjacent to the celiac trunk supplying the right inferior phrenic artery and an additional left-sided branch that appears to represent an accessory gastric artery. Celiac arteriography demonstrates typical branching pattern with patent splenic, left gastric and common hepatic arteries identified. The gastroduodenal artery emanates off of the common hepatic artery and normally patent proper, right and left hepatic arteries are identified. Common hepatic arteriography demonstrates typical anatomy of the gastroduodenal artery as well as a gastroepiploic artery. In an oblique projection, there was evidence of an arterial pseudoaneurysm of a gastroduodenal branch representing a transverse proximal branch of the GDA supplying the region of the duodenal bulb/distal stomach. This pseudoaneurysm is located along the lateral aspect of the hemostatic clip placed at the time of endoscopy. Due to transverse takeoff of the arterial branch and small caliber of the branch, a microcatheter could not be advanced into the duodenal branch to allow selective embolization of the branch supplying the pseudoaneurysm. Embolization was therefore performed of the gastroduodenal artery itself including across the origin of the offending branch. Gastroduodenal arteriography demonstrates additional normal duodenal branches and a normal gastroepiploic  artery. Embolization of the gastroduodenal trunk was performed successfully nearly to the orifice of the GDA. This resulted in complete occlusion of the artery including the transverse duodenal branch supplying the arterial pseudoaneurysm. There was preserved flow in the hepatic arteries. 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 This is located immediately adjacent to the hemostatic clip placed at the time of endoscopy. The gastroduodenal artery was successfully occluded with embolization coils including across the origin of the abnormal proximal duodenal branch resulting in complete occlusion of the branch supplying the  pseudoaneurysm. Electronically Signed   By: Aletta Edouard M.D.   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 Status post endoscopy with placement of single hemostatic clip at the location of most significant here into blood clot. The patient presents for arteriography with probable embolization blood supply to the region of the bleeding ulcer. MEDICATIONS: None ANESTHESIA/SEDATION: The patient was intubated and monitored by Anesthesia during the procedure. He had been intubated for endoscopy immediately prior to the arteriographic procedure and was transported to IR directly from Endoscopy. CONTRAST:  50 mL Omnipaque 300 FLUOROSCOPY TIME:  Fluoroscopy Time: 16 minutes and 54 seconds. 1208 mGy. COMPLICATIONS: None immediate. PROCEDURE: Informed consent was obtained from the patient's daughter following explanation of the procedure, risks, benefits and alternatives. The patient's daughter understands, agrees and consents for the procedure. All questions were addressed. A time out was performed  prior to the initiation of the procedure. Maximal barrier sterile technique utilized including caps, mask, sterile gowns, sterile gloves, large sterile drape, hand hygiene, and chlorhexidine prep. Ultrasound was utilized to confirm patency of the right common femoral artery. Local anesthesia was provided with 1% lidocaine. The right common femoral artery was accessed utilizing a 21 gauge needle and micropuncture set under ultrasound guidance. Ultrasound image documentation was performed. A 5 French sheath was placed over a guidewire. A 5 French Cobra catheter was advanced into the abdominal aorta. Selective arteriography was initially performed of a trunk off of the aorta supplying the right inferior phrenic artery. The catheter was then used to selectively catheterize the celiac axis. Selective arteriography was performed of the celiac axis. The 5 French catheter was further advanced into the common hepatic artery and selective arteriography performed. A microcatheter was then advanced through the 5 French catheter into the gastroduodenal artery and selective arteriography performed. Attempt was made to catheterize a branch of the gastroduodenal artery. Transcatheter embolization was performed of the gastroduodenal artery via the microcatheter with placement of multiple embolization coils. A total of 8 Interlock 0.018 inch microcoils ranging in diameter from 4 mm up to 6 mm of various lengths were placed. Periodic arteriography was performed through the microcatheter during coil embolization. Additional arteriography was then performed at the level of the common hepatic artery via the 5 French Cobra catheter. The catheter was removed. Oblique arteriography was performed at the level of the common femoral access via the sheath. Femoral closure was performed with the Cordis ExoSeal device. FINDINGS: Initial catheterization was performed of a trunk just adjacent to the celiac trunk supplying the right inferior phrenic  artery and an additional left-sided branch that appears to represent an accessory gastric artery. Celiac arteriography demonstrates typical branching pattern with patent splenic, left gastric and common hepatic arteries identified. The gastroduodenal artery emanates off of the common hepatic artery and normally patent proper, right and left hepatic arteries are identified. Common hepatic arteriography demonstrates typical anatomy of the gastroduodenal artery as well as a gastroepiploic artery. In an oblique projection, there was evidence of an arterial pseudoaneurysm of a gastroduodenal branch representing a transverse proximal branch of the GDA supplying the region of the duodenal bulb/distal stomach. This pseudoaneurysm is located along the lateral aspect of the hemostatic clip placed at the time of endoscopy. Due to transverse takeoff of the arterial branch and small caliber of the branch, a microcatheter could not be advanced into the duodenal branch to allow  selective embolization of the branch supplying the pseudoaneurysm. Embolization was therefore performed of the gastroduodenal artery itself including across the origin of the offending branch. Gastroduodenal arteriography demonstrates additional normal duodenal branches and a normal gastroepiploic artery. Embolization of the gastroduodenal trunk was performed successfully nearly to the orifice of the GDA. This resulted in complete occlusion of the artery including the transverse duodenal branch supplying the arterial pseudoaneurysm. There was preserved flow in the hepatic arteries. 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 This is located immediately adjacent to the hemostatic clip placed at the time of endoscopy. The gastroduodenal artery was successfully occluded with embolization coils including  across the origin of the abnormal proximal duodenal branch resulting in complete occlusion of the branch supplying the pseudoaneurysm. Electronically Signed   By: Aletta Edouard M.D.   On: 04/30/2019 08:38   Dg Chest Port 1 View  Result Date: 04/28/2019 CLINICAL DATA:  Generalized weakness over the last 3 days. Tremors. Syncopal episode. EXAM: PORTABLE CHEST 1 VIEW COMPARISON:  04/20/2019 FINDINGS: Borderline cardiomegaly. Mediastinal shadows are normal. The left chest is clear. 5 cm mass in the right lower lobe as seen on previous imaging. No evidence new finding. No heart failure or effusion. No significant bone abnormality. IMPRESSION: No change.  5 cm right lower lobe mass.  No other active finding. Electronically Signed   By: Nelson Chimes M.D.   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 Status post endoscopy with placement of single hemostatic clip at the location of most significant here into blood clot. The patient presents for arteriography with probable embolization blood supply to the region of the bleeding ulcer. MEDICATIONS: None ANESTHESIA/SEDATION: The patient was intubated and monitored by Anesthesia during the procedure. He had been intubated for endoscopy immediately prior to the arteriographic procedure and was transported to IR directly from Endoscopy. CONTRAST:  50 mL Omnipaque 300 FLUOROSCOPY TIME:  Fluoroscopy Time: 16 minutes and 54 seconds. 1208 mGy. COMPLICATIONS: None immediate. PROCEDURE: Informed consent was obtained from the patient's daughter following explanation of the procedure, risks, benefits and alternatives. The patient's daughter understands, agrees and consents for the procedure. All questions were addressed. A time out was performed prior to the initiation of the procedure.  Maximal barrier sterile technique utilized including caps, mask, sterile gowns, sterile gloves, large sterile drape, hand hygiene, and chlorhexidine prep. Ultrasound was utilized to confirm patency of the right common femoral artery. Local anesthesia was provided with 1% lidocaine. The right common femoral artery was accessed utilizing a 21 gauge needle and micropuncture set under ultrasound guidance. Ultrasound image documentation was performed. A 5 French sheath was placed over a guidewire. A 5 French Cobra catheter was advanced into the abdominal aorta. Selective arteriography was initially performed of a trunk off of the aorta supplying the right inferior phrenic artery. The catheter was then used to selectively catheterize the celiac axis. Selective arteriography was performed of the celiac axis. The 5 French catheter was further advanced into the common hepatic artery and selective arteriography performed. A microcatheter was then advanced through the 5 French catheter into the gastroduodenal artery and selective arteriography performed. Attempt was made to catheterize a branch of the gastroduodenal artery. Transcatheter embolization was performed of the  gastroduodenal artery via the microcatheter with placement of multiple embolization coils. A total of 8 Interlock 0.018 inch microcoils ranging in diameter from 4 mm up to 6 mm of various lengths were placed. Periodic arteriography was performed through the microcatheter during coil embolization. Additional arteriography was then performed at the level of the common hepatic artery via the 5 French Cobra catheter. The catheter was removed. Oblique arteriography was performed at the level of the common femoral access via the sheath. Femoral closure was performed with the Cordis ExoSeal device. FINDINGS: Initial catheterization was performed of a trunk just adjacent to the celiac trunk supplying the right inferior phrenic artery and an additional left-sided  branch that appears to represent an accessory gastric artery. Celiac arteriography demonstrates typical branching pattern with patent splenic, left gastric and common hepatic arteries identified. The gastroduodenal artery emanates off of the common hepatic artery and normally patent proper, right and left hepatic arteries are identified. Common hepatic arteriography demonstrates typical anatomy of the gastroduodenal artery as well as a gastroepiploic artery. In an oblique projection, there was evidence of an arterial pseudoaneurysm of a gastroduodenal branch representing a transverse proximal branch of the GDA supplying the region of the duodenal bulb/distal stomach. This pseudoaneurysm is located along the lateral aspect of the hemostatic clip placed at the time of endoscopy. Due to transverse takeoff of the arterial branch and small caliber of the branch, a microcatheter could not be advanced into the duodenal branch to allow selective embolization of the branch supplying the pseudoaneurysm. Embolization was therefore performed of the gastroduodenal artery itself including across the origin of the offending branch. Gastroduodenal arteriography demonstrates additional normal duodenal branches and a normal gastroepiploic artery. Embolization of the gastroduodenal trunk was performed successfully nearly to the orifice of the GDA. This resulted in complete occlusion of the artery including the transverse duodenal branch supplying the arterial pseudoaneurysm. There was preserved flow in the hepatic arteries. 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 This is located immediately adjacent to the hemostatic clip placed at the time of endoscopy. The gastroduodenal artery was successfully occluded with embolization coils including across the origin of the abnormal  proximal duodenal branch resulting in complete occlusion of the branch supplying the pseudoaneurysm. Electronically Signed   By: Aletta Edouard M.D.   On: 04/30/2019 08:38    Labs:  CBC: Recent Labs    04/24/19 0314 04/27/19 1207 04/28/19 1529  04/29/19 1625 04/29/19 1745 04/29/19 2321 04/30/19 0328  WBC 9.3 11.2* 10.8*  --   --   --   --  12.4*  HGB 7.9* 7.5 Repeated and verified X2.* 6.7*   < > 8.8* 6.8* 9.1* 8.8*  HCT 25.6* 23.8 Repeated and verified X2.* 22.6*   < > 26.0* 20.0* 27.9* 26.9*  PLT 377 425.0* 394  --   --   --   --  262   < > = values in this interval not displayed.    COAGS: Recent Labs    04/08/19 0410 04/20/19 0147 04/28/19 1655  INR 1.3* 1.4* 1.2  APTT 25 32  --     BMP: Recent Labs    04/24/19 0314 04/28/19 1529 04/29/19 0014 04/29/19 1625 04/29/19 1745 04/30/19 0328  NA 139 137 137 136 138 141  K 3.5 4.9 4.2 6.3* 4.3 4.3  CL 106 101 103  --   --  109  CO2 22 24  25  --   --  21*  GLUCOSE 104* 132* 85 113* 103* 96  BUN 8 29* 31*  --   --  28*  CALCIUM 8.7* 9.0 8.7*  --   --  8.8*  CREATININE 1.22 1.47* 1.37*  --   --  1.42*  GFRNONAA 58* 46* 50*  --   --  48*  GFRAA >60 54* 58*  --   --  56*    LIVER FUNCTION TESTS: Recent Labs    04/23/19 0227 04/24/19 0314 04/28/19 1529 04/29/19 0014  BILITOT 0.9 0.8 0.8 1.2  AST 23 16 31  14*  ALT 13 12 14 12   ALKPHOS 70 71 74 70  PROT 6.1* 6.3* 6.1* 5.8*  ALBUMIN 2.7* 2.6* 2.8* 2.6*    Assessment and Plan:  Post mesenteric arteriogram with GDA embolization Pt up in bed; no symptoms-- feeling great today Hg stable Call us if need [place]  Electronically Signed: Lavonia Drafts, PA-C 04/30/2019, 8:52 AM   I spent a total of 15 Minutes at the the patient's bedside AND on the patient's hospital floor or unit, greater than 50% of which was counseling/coordinating care for mesenteric arteriogram with GDA [facility]

## 2019-04-30 NOTE — TOC Initial Note (Signed)
Transition of Care [facility]) - Initial/Assessment Note    Patient Details  Name: John [name] MRN: [id number] Date of Birth: [birth year]  Transition of Care [facility]) CM/SW Contact:    Bethena Roys, RN Phone Number: 04/30/2019, 3:38 PM  Clinical Narrative:  Pt presented for Symptomatic anemia. PTA from home with daughter. Patient utilizes a cane in the home. In need of [place] [facility]. CM did speak with daughter over the phone and went over agency list. Daughter chose [facility]. Referral provided to [facility] with [facility] and [facility] to begin within 24-48 hours post transition home. Pt has PCP at [facility] @ [place]-f/u appointment scheduled. f No further needs at this time.                Expected Discharge Plan: [place] Barriers to Discharge: Continued Medical Work up   Patient Goals and CMS Choice Patient states their goals for this hospitalization and ongoing recovery are:: "to go home"  CMS Medicare.gov Compare Post Acute Care list provided to:: (Spoke to daughter over the phone and discussed list. ) Choice offered to / list presented to : NA  Expected Discharge Plan and Services Expected Discharge Plan: [place]   Discharge Planning Services: CM Consult Post Acute Care Choice: NA Living arrangements for the past 2 months: Single Family Home                 DME Arranged: N/A DME Agency: NA       HH Arranged: RN, Disease Management [place] Agency: [facility] Date [place] Agency Contacted: 04/30/19 Time [place]: [place] Representative spoke with at [place]: Dorian Pod  Prior Living Arrangements/Services Living arrangements for the past 2 months: [place] Lives with:: Adult Children Patient language and need for interpreter reviewed:: Yes Do you feel safe going back to the place where you live?: Yes      Need for Family Participation in Patient Care: Yes (Comment) Care giver support system in place?: Yes  (comment) Current home services: DME(Pt has a cane) Criminal Activity/Legal Involvement Pertinent to Current Situation/Hospitalization: No - Comment as needed  Activities of Daily Living      Permission Sought/Granted Permission sought to share information with : Family Supports     Emotional Assessment Appearance:: Appears stated age Attitude/Demeanor/Rapport: Engaged Affect (typically observed): Accepting Orientation: : Oriented to Self, Oriented to Place Alcohol / Substance Use: Not Applicable Psych Involvement: No (comment)  Admission diagnosis:  Generalized weakness [R53.1] Symptomatic anemia [D64.9] Patient Active Problem List   Diagnosis Date Noted  . Helicobacter pylori gastritis   . Chronic duodenal ulcer with hemorrhage   . Multiple gastric ulcers   . Acute esophagitis   . Gastrointestinal hemorrhage 04/20/2019  . Vitamin B12 deficiency 04/20/2019  . Folate deficiency 04/20/2019  . Dehydration 04/20/2019  . Hypotension 04/20/2019  . Melena   . Acute blood loss anemia   . CKD (chronic kidney disease), stage III ([place]) 04/08/2019  . Right lower lobe lung mass 04/08/2019  . Syncope and collapse 04/08/2019  . Symptomatic anemia 04/08/2019  . Occult GI bleeding 04/08/2019  . Hyperglycemia 04/08/2019  . Leukocytosis 04/08/2019  . Syncope 04/08/2019  . Low hemoglobin   . Dyspnea on exertion 08/25/2017  . Bursitis of elbow 03/15/2016  . Obesity (BMI 30-39.9) 12/03/2015  . Primary cancer of right lower lobe of lung ([place]) 06/14/2015  . Solitary pulmonary nodule 03/13/2015  . RVF (right ventricular  failure) ([place]) 11/04/2014  . COPD II/III with reversibility  12/19/2012  . Chronic respiratory failure with hypoxia ([place]) 12/18/2012  . Drug-induced hyperglycemia 12/08/2012  . Allergic drug rash 12/06/2012  . Pulmonary HTN ([place]) 03/18/2012  . Abnormal echocardiogram 03/18/2012  . LV dysfunction 03/18/2012  . COPD exacerbation ([place]) 02/22/2012  . Chronic diastolic  CHF (congestive heart failure) ([place]) 02/22/2012  . HTN (hypertension) 02/22/2012  . CAD (coronary artery disease) 02/22/2012  . Acute on chronic systolic heart failure ([place]) 02/22/2012   PCP:  Patient, No Pcp Per Pharmacy:   [place], [place] - [address] [place] [address] Phone: [phone] Fax: [phone]     Social Determinants of Health (SDOH) Interventions    Readmission Risk Interventions Readmission Risk Prevention Plan 04/30/2019  Transportation Screening Complete  Medication Review ([place]) Complete  HRI or [place] Complete  [place] Not Applicable  Some recent data might be hidden

## 2019-04-30 NOTE — Progress Notes (Addendum)
Daily Rounding Note  04/30/2019, 11:26 AM  LOS: 1 day   SUBJECTIVE:   Chief complaint: bleeding duodenal ulcer    Stools still dark.  Feels well.  No dizziness, no nausea.  No ab pain.    OBJECTIVE:         Vital signs in last 24 hours:    Temp:  [97.3 F (36.3 C)-98.5 F (36.9 C)] 97.9 F (36.6 C) (05/01 1124) Pulse Rate:  [73-107] 73 (05/01 1124) Resp:  [13-23] 20 (05/01 1124) BP: (90-160)/(54-109) 125/109 (05/01 1124) SpO2:  [94 %-100 %] 97 % (05/01 0900) Weight:  [87 kg-87.8 kg] 87 kg (05/01 0437) Last BM Date: 04/28/19 Filed Weights   04/29/19 0433 04/29/19 1445 04/30/19 0437  Weight: 87.8 kg 87.8 kg 87 kg   General: pleasant, comfortable. Aged but no acutely ill looking   Heart: RRR Chest: clear bil but BS diminished on right Abdomen: obese, active BS.  ND  Extremities: no CCE Neuro/Psych:  Appropriated, calm,  Fluid speech.  No gross deficits or weakness.     Intake/Output from previous day: 04/30 0701 - 05/01 0700 In: 873 [Blood:623; IV Piggyback:250] Out: 600 [Urine:600]  Intake/Output this shift: Total I/O In: 240 [P.O.:240] Out: -   Lab Results: Recent Labs    04/27/19 1207 04/28/19 1529  04/29/19 1745 04/29/19 2321 04/30/19 0328  WBC 11.2* 10.8*  --   --   --  12.4*  HGB 7.5 Repeated and verified X2.* 6.7*   < > 6.8* 9.1* 8.8*  HCT 23.8 Repeated and verified X2.* 22.6*   < > 20.0* 27.9* 26.9*  PLT 425.0* 394  --   --   --  262   < > = values in this interval not displayed.   BMET Recent Labs    04/28/19 1529 04/29/19 0014 04/29/19 1625 04/29/19 1745 04/30/19 0328  NA 137 137 136 138 141  K 4.9 4.2 6.3* 4.3 4.3  CL 101 103  --   --  109  CO2 24 25  --   --  21*  GLUCOSE 132* 85 113* 103* 96  BUN 29* 31*  --   --  28*  CREATININE 1.47* 1.37*  --   --  1.42*  CALCIUM 9.0 8.7*  --   --  8.8*   LFT Recent Labs    04/28/19 1529 04/29/19 0014  PROT 6.1* 5.8*  ALBUMIN 2.8*  2.6*  AST 31 14*  ALT 14 12  ALKPHOS 74 70  BILITOT 0.8 1.2   PT/INR Recent Labs    04/28/19 1655  LABPROT 15.3*  INR 1.2   Hepatitis Panel No results for input(s): HEPBSAG, HCVAB, HEPAIGM, HEPBIGM in the last 72 hours.  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 Status post endoscopy with placement of single hemostatic clip at the location of most significant here into blood clot. The patient presents for arteriography with probable embolization blood supply to the region of the bleeding ulcer. MEDICATIONS: None ANESTHESIA/SEDATION: The patient was intubated and monitored by Anesthesia during the procedure. He had been intubated for endoscopy immediately prior to the arteriographic procedure and was transported to IR directly from Endoscopy. CONTRAST:  50 mL  Omnipaque 300 FLUOROSCOPY TIME:  Fluoroscopy Time: 16 minutes and 54 seconds. 1208 mGy. COMPLICATIONS: None immediate. PROCEDURE: Informed consent was obtained from the patient's daughter following explanation of the procedure, risks, benefits and alternatives. The patient's daughter understands, agrees and consents for the procedure. All questions were addressed. A time out was performed prior to the initiation of the procedure. Maximal barrier sterile technique utilized including caps, mask, sterile gowns, sterile gloves, large sterile drape, hand hygiene, and chlorhexidine prep. Ultrasound was utilized to confirm patency of the right common femoral artery. Local anesthesia was provided with 1% lidocaine. The right common femoral artery was accessed utilizing a 21 gauge needle and micropuncture set under ultrasound  guidance. Ultrasound image documentation was performed. A 5 French sheath was placed over a guidewire. A 5 French Cobra catheter was advanced into the abdominal aorta. Selective arteriography was initially performed of a trunk off of the aorta supplying the right inferior phrenic artery. The catheter was then used to selectively catheterize the celiac axis. Selective arteriography was performed of the celiac axis. The 5 French catheter was further advanced into the common hepatic artery and selective arteriography performed. A microcatheter was then advanced through the 5 French catheter into the gastroduodenal artery and selective arteriography performed. Attempt was made to catheterize a branch of the gastroduodenal artery. Transcatheter embolization was performed of the gastroduodenal artery via the microcatheter with placement of multiple embolization coils. A total of 8 Interlock 0.018 inch microcoils ranging in diameter from 4 mm up to 6 mm of various lengths were placed. Periodic arteriography was performed through the microcatheter during coil embolization. Additional arteriography was then performed at the level of the common hepatic artery via the 5 French Cobra catheter. The catheter was removed. Oblique arteriography was performed at the level of the common femoral access via the sheath. Femoral closure was performed with the Cordis ExoSeal device. FINDINGS: Initial catheterization was performed of a trunk just adjacent to the celiac trunk supplying the right inferior phrenic artery and an additional left-sided branch that appears to represent an accessory gastric artery. Celiac arteriography demonstrates typical branching pattern with patent splenic, left gastric and common hepatic arteries identified. The gastroduodenal artery emanates off of the common hepatic artery and normally patent proper, right and left hepatic arteries are identified. Common hepatic arteriography demonstrates typical anatomy of  the gastroduodenal artery as well as a gastroepiploic artery. In an oblique projection, there was evidence of an arterial pseudoaneurysm of a gastroduodenal branch representing a transverse proximal branch of the GDA supplying the region of the duodenal bulb/distal stomach. This pseudoaneurysm is located along the lateral aspect of the hemostatic clip placed at the time of endoscopy. Due to transverse takeoff of the arterial branch and small caliber of the branch, a microcatheter could not be advanced into the duodenal branch to allow selective embolization of the branch supplying the pseudoaneurysm. Embolization was therefore performed of the gastroduodenal artery itself including across the origin of the offending branch. Gastroduodenal arteriography demonstrates additional normal duodenal branches and a normal gastroepiploic artery. Embolization of the gastroduodenal trunk was performed successfully nearly to the orifice of the GDA. This resulted in complete occlusion of the artery including the transverse duodenal branch supplying the arterial pseudoaneurysm. There was preserved flow in the hepatic arteries. 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 This is located immediately adjacent to the hemostatic clip placed at the time of  endoscopy. The gastroduodenal artery was successfully occluded with embolization coils including across the origin of the abnormal proximal duodenal branch resulting in complete occlusion of the branch supplying the pseudoaneurysm. Electronically Signed   By: Aletta Edouard M.D.   On: 04/30/2019 08:38    Dg Chest Port 1 View  Result Date: 04/28/2019 CLINICAL DATA:  Generalized weakness over the last 3 days. Tremors. Syncopal episode. EXAM: PORTABLE CHEST 1 VIEW COMPARISON:  04/20/2019 FINDINGS: Borderline cardiomegaly.  Mediastinal shadows are normal. The left chest is clear. 5 cm mass in the right lower lobe as seen on previous imaging. No evidence new finding. No heart failure or effusion. No significant bone abnormality. IMPRESSION: No change.  5 cm right lower lobe mass.  No other active finding. Electronically Signed   By: Nelson Chimes M.D.   On: 04/28/2019 15:46   Scheduled Meds:  sodium chloride   Intravenous Once   sodium chloride   Intravenous Once   amoxicillin  1,000 mg Oral Q12H   clarithromycin  500 mg Oral Q12H   insulin aspart  0-7 Units Subcutaneous Q4H   mometasone-formoterol  2 puff Inhalation BID   sodium chloride flush  3 mL Intravenous Q12H   sodium chloride flush  3 mL Intravenous Q12H   Continuous Infusions:  sodium chloride     pantoprozole (PROTONIX) infusion 8 mg/hr (04/30/19 0911)   PRN Meds:.sodium chloride, acetaminophen **OR** acetaminophen, albuterol, ondansetron **OR** ondansetron (ZOFRAN) IV, sodium chloride flush  ASSESMENT:   *     Bleeding duodenal ulcer. 04/30/2019.  Arteriography showing pseudoaneurysm of proximal branch of duodenal branch of gastro-duodenal artery.  Small supplying branch could not be catheterized so long segment of GDA coiled, embolized. Presence of duodenal ulcer established on EGD 4/22.  H pylori + but also using aspirin powders and ibuprofen Continues on Protonix infusion and PO Amoxil/clarithromycin  *     Blood loss anemia.   S/p PRBC x4.  Hgb 6.7 >> 7.3 >> 6.4 >> 8.8  *   Lung mass.  History NSCLC.  Has appointment to meet with oncology med on 5/6.   PLAN   *    ?  Need to complete 72 hours of Protonix infusion or can we switch today over to IV twice daily versus oral twice daily?    *    Has appointment with GI Dr. Silverio Decamp on 5/29.  *  Full diet at dinner, then [facility] diet in AM.    *   If stable hgb and vitals, possible DC in AM.      Azucena Freed  04/30/2019, 11:26 AM Phone [phone]   Attending physician's note    I have taken an interval history, reviewed the chart and examined the patient. I agree with the Advanced Practitioner's note, impression and recommendations.   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 Denzil Magnuson , MD [phone]

## 2019-04-30 NOTE — Progress Notes (Addendum)
Patient alert and oriented * 1. Patient confused about whereabouts, time, and situation. Patient is easily oriented , no facial droop, no drift, and clear speech. NIH preformed. Score of 1. HR  103 ST, BP 131/626, 100 %RA. Pt s/p anesthesia.  Paged [facility]. Will continue to monitor patient.

## 2019-04-30 NOTE — Progress Notes (Signed)
PROGRESS NOTE  John [name] MPN:[id number] DOB: [birth year] DOA: 04/28/2019 PCP: Patient, No Pcp Per  HPI/Recap of past 24 hours:  He denies pain, pleasantly confused, oriented to person, calm and cooperative currently  On ppi drip, no active bleed currently, hgb 8.8 Assessment/Plan: Principal Problem:   Symptomatic anemia Active Problems:   Chronic diastolic CHF (congestive heart failure) (HCC)   Chronic respiratory failure with hypoxia ([place])   Primary cancer of right lower lobe of lung (HCC)   CKD (chronic kidney disease), stage III (HCC)   Gastrointestinal hemorrhage   Helicobacter pylori gastritis   1. Symptomatic anemia; recurrent UGIB  - Presents with recurrence in melena, gen weakness, light headedness on standing, and pre-syncope - Found to have Hgb of 6.7 in ED, down from 7.9 four days earlier  - He had EGD during recent admission with duodenal ulcer with adherent clot, ulceration of prepyloric stomach, esophagitis  - He was also found to have H pylori and his condition was suspected secondary to NSAID's and H pylori  - s/p EGD and IR embolization of GDA on 4/30, continue on ppi drip -GI and IR input appreciated  2. Syncope; orthostatic hypotension  - Presents with recurrence in melena, gen weakness, light headedness on standing, and pre-syncope  - He became hypotensive and had brief LOC while standing in ED for orthostatic vitals  - Secondary to recurrent GIB  - BP improved with IVF in ED and he is now receiving RBC's  - Hold beta-blocker, repeat orthostatic vitals in am  , likely able to resume betablocker tomorrow if no more bleeding  3. COPD; chronic hypoxic respiratory failure  -Patient uses 2 Lpm supplemental O2 at baseline, reports his chronic dyspnea is stable -Continue ICS/LABA, supplemental O2, and as-needed albuterol  -cxr no acute infiltrate, lung clear on exam, no hypoxia, no cough,  4. Lung mass; history of lung cancer  -Patient has hx  ofnon small cell lung cancer s/p curative radiotherapy to RML nodule and hilar adenopathy,and had new RLL mass noted during recent admission - No acute changes on admission CXR  -He will continue oncology Dr Julien Nordmann follow-up after discharge  5. Chronic diastolic CHF  - Appears hypovolemic in ED, was hypotensive on standing and treated with 250 cc NS  - He is receiving 2 units RBC on admission  - Hold beta-blocker in setting of hypotension, follow daily weights  , appear euvolemic  today  6. CKD III  - SCr is 1.47 on admission, similar to priors  - Renally-dose medications    7. Type II DM , noninsulin dependent, controlled - A1c was 5.8% earlier this month  - Managed with metformin at home, held on admission -Check CBG's and use SSI with Novolog as needed while in hospital  8. CAD - No anginal complaints  - Hold beta-blocker in light of hypotension and avoid antiplatelets with GIB   9. Dementia:  Daughter reports patient has been having progressive memory loss for several year, she is interested in neurology referral for dementia evaluation Daughter requested to set up home health RN   PPE: Mask, face shield  DVT prophylaxis: SCD's  Code Status: Full  Family Communication: Discussed with patient , talked to daughter over the phone Consults: Gastroenterology    Disposition Plan: home on 5/2 if no more bleeding, able to tolerate diet and hgb stable, needs GI clearance prior to discharge  Daughter desires to set up home health RN  Procedures:  EGD on 4/30  IR  GDA arteriography and embolization on 4/30  Antibiotics:  Amoxil/clarithromycin for H pylori   Objective: BP (!) 125/109 (BP Location: Right Arm)    Pulse 73    Temp 97.9 F (36.6 C) (Oral)    Resp 20    Ht 5\' 5"  (1.651 m)    Wt 87 kg    SpO2 97%    BMI 31.92 kg/m   Intake/Output Summary (Last 24 hours) at 04/30/2019 1150 Last data filed at 04/30/2019 0900 Gross per 24 hour  Intake 1113 ml    Output 600 ml  Net 513 ml   Filed Weights   04/29/19 0433 04/29/19 1445 04/30/19 0437  Weight: 87.8 kg 87.8 kg 87 kg    Exam: Patient is examined daily including today on 04/30/2019, exams remain the same as of yesterday except that has changed    General:  NAD, pleasantly confused, oriented to self only, calm and cooperative currently  Cardiovascular: RRR  Respiratory: CTABL  Abdomen: Soft/ND/NT, positive BS  Musculoskeletal: No Edema  Neuro: alert, oriented   Data Reviewed: Basic Metabolic Panel: Recent Labs  Lab 04/24/19 0314 04/28/19 1529 04/28/19 1601 04/29/19 0014 04/29/19 1625 04/29/19 1745 04/30/19 0328  NA 139 137  --  137 136 138 141  K 3.5 4.9  --  4.2 6.3* 4.3 4.3  CL 106 101  --  103  --   --  109  CO2 22 24  --  25  --   --  21*  GLUCOSE 104* 132*  --  85 113* 103* 96  BUN 8 29*  --  31*  --   --  28*  CREATININE 1.22 1.47*  --  1.37*  --   --  1.42*  CALCIUM 8.7* 9.0  --  8.7*  --   --  8.8*  MG  --   --  1.8  --   --   --   --   PHOS  --   --  3.6  --   --   --   --    Liver Function Tests: Recent Labs  Lab 04/24/19 0314 04/28/19 1529 04/29/19 0014  AST 16 31 14*  ALT 12 14 12   ALKPHOS 71 74 70  BILITOT 0.8 0.8 1.2  PROT 6.3* 6.1* 5.8*  ALBUMIN 2.6* 2.8* 2.6*   No results for input(s): LIPASE, AMYLASE in the last 168 hours. No results for input(s): AMMONIA in the last 168 hours. CBC: Recent Labs  Lab 04/24/19 0314 04/27/19 1207 04/28/19 1529  04/29/19 1133 04/29/19 1625 04/29/19 1745 04/29/19 2321 04/30/19 0328  WBC 9.3 11.2* 10.8*  --   --   --   --   --  12.4*  NEUTROABS  --   --  9.0*  --   --   --   --   --   --   HGB 7.9* 7.5 Repeated and verified X2.* 6.7*   < > 6.4* 8.8* 6.8* 9.1* 8.8*  HCT 25.6* 23.8 Repeated and verified X2.* 22.6*   < > 20.9* 26.0* 20.0* 27.9* 26.9*  MCV 96.2 94.6 97.8  --   --   --   --   --  90.0  PLT 377 425.0* 394  --   --   --   --   --  262   < > = values in this interval not displayed.    Cardiac Enzymes:   Recent Labs  Lab 04/28/19 1529  TROPONINI <0.03   BNP (  last 3 results) Recent Labs    04/08/19 0310 04/28/19 1529  BNP 61.0 49.5    ProBNP (last 3 results) No results for input(s): PROBNP in the last 8760 hours.  CBG: Recent Labs  Lab 04/29/19 2109 04/30/19 0010 04/30/19 0435 04/30/19 0814 04/30/19 1122  GLUCAP 102* 106* 96 97 120*    Recent Results (from the past 240 hour(s))  SARS Coronavirus 2 [facility] order, Performed in [place] hospital lab)     Status: None   Collection Time: 04/28/19  4:54 PM  Result Value Ref Range Status   SARS Coronavirus 2 NEGATIVE NEGATIVE Final    Comment: (NOTE) If result is NEGATIVE SARS-CoV-2 target nucleic acids are NOT DETECTED. The SARS-CoV-2 RNA is generally detectable in upper and lower  respiratory specimens during the acute phase of infection. The lowest  concentration of SARS-CoV-2 viral copies this assay can detect is 250  copies / mL. A negative result does not preclude SARS-CoV-2 infection  and should not be used as the sole basis for treatment or other  patient management decisions.  A negative result may occur with  improper specimen collection / handling, submission of specimen other  than nasopharyngeal swab, presence of viral mutation(s) within the  areas targeted by this assay, and inadequate number of viral copies  (<250 copies / mL). A negative result must be combined with clinical  observations, patient history, and epidemiological information. If result is POSITIVE SARS-CoV-2 target nucleic acids are DETECTED. The SARS-CoV-2 RNA is generally detectable in upper and lower  respiratory specimens dur ing the acute phase of infection.  Positive  results are indicative of active infection with SARS-CoV-2.  Clinical  correlation with patient history and other diagnostic information is  necessary to determine patient infection status.  Positive results do  not rule out bacterial infection  or co-infection with other viruses. If result is PRESUMPTIVE POSTIVE SARS-CoV-2 nucleic acids MAY BE PRESENT.   A presumptive positive result was obtained on the submitted specimen  and confirmed on repeat testing.  While 2019 novel coronavirus  (SARS-CoV-2) nucleic acids may be present in the submitted sample  additional confirmatory testing may be necessary for epidemiological  and / or clinical management purposes  to differentiate between  SARS-CoV-2 and other Sarbecovirus currently known to infect humans.  If clinically indicated additional testing with an alternate test  methodology [phone]) is advised. The SARS-CoV-2 RNA is generally  detectable in upper and lower respiratory sp ecimens during the acute  phase of infection. The expected result is Negative. Fact Sheet for Patients:  StrictlyIdeas.no Fact Sheet for Healthcare Providers: BankingDealers.co.za This test is not yet approved or cleared by the [place] FDA and has been authorized for detection and/or diagnosis of SARS-CoV-2 by FDA under an Emergency Use Authorization (EUA).  This EUA will remain in effect (meaning this test can be used) for the duration of the COVID-19 declaration under Section 564(b)(1) of the Act, 21 U.S.C. section 360bbb-3(b)(1), unless the authorization is terminated or revoked sooner. 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 Status post endoscopy with placement of single hemostatic clip at the location of most significant here into blood clot. The patient presents for arteriography with probable embolization blood supply to the region of the bleeding ulcer. MEDICATIONS: None ANESTHESIA/SEDATION:  The patient was  intubated and monitored by Anesthesia during the procedure. He had been intubated for endoscopy immediately prior to the arteriographic procedure and was transported to IR directly from Endoscopy. CONTRAST:  50 mL Omnipaque 300 FLUOROSCOPY TIME:  Fluoroscopy Time: 16 minutes and 54 seconds. 1208 mGy. COMPLICATIONS: None immediate. PROCEDURE: Informed consent was obtained from the patient's daughter following explanation of the procedure, risks, benefits and alternatives. The patient's daughter understands, agrees and consents for the procedure. All questions were addressed. A time out was performed prior to the initiation of the procedure. Maximal barrier sterile technique utilized including caps, mask, sterile gowns, sterile gloves, large sterile drape, hand hygiene, and chlorhexidine prep. Ultrasound was utilized to confirm patency of the right common femoral artery. Local anesthesia was provided with 1% lidocaine. The right common femoral artery was accessed utilizing a 21 gauge needle and micropuncture set under ultrasound guidance. Ultrasound image documentation was performed. A 5 French sheath was placed over a guidewire. A 5 French Cobra catheter was advanced into the abdominal aorta. Selective arteriography was initially performed of a trunk off of the aorta supplying the right inferior phrenic artery. The catheter was then used to selectively catheterize the celiac axis. Selective arteriography was performed of the celiac axis. The 5 French catheter was further advanced into the common hepatic artery and selective arteriography performed. A microcatheter was then advanced through the 5 French catheter into the gastroduodenal artery and selective arteriography performed. Attempt was made to catheterize a branch of the gastroduodenal artery. Transcatheter embolization was performed of the gastroduodenal artery via the microcatheter with placement of multiple embolization coils. A total of 8 Interlock 0.018  inch microcoils ranging in diameter from 4 mm up to 6 mm of various lengths were placed. Periodic arteriography was performed through the microcatheter during coil embolization. Additional arteriography was then performed at the level of the common hepatic artery via the 5 French Cobra catheter. The catheter was removed. Oblique arteriography was performed at the level of the common femoral access via the sheath. Femoral closure was performed with the Cordis ExoSeal device. FINDINGS: Initial catheterization was performed of a trunk just adjacent to the celiac trunk supplying the right inferior phrenic artery and an additional left-sided branch that appears to represent an accessory gastric artery. Celiac arteriography demonstrates typical branching pattern with patent splenic, left gastric and common hepatic arteries identified. The gastroduodenal artery emanates off of the common hepatic artery and normally patent proper, right and left hepatic arteries are identified. Common hepatic arteriography demonstrates typical anatomy of the gastroduodenal artery as well as a gastroepiploic artery. In an oblique projection, there was evidence of an arterial pseudoaneurysm of a gastroduodenal branch representing a transverse proximal branch of the GDA supplying the region of the duodenal bulb/distal stomach. This pseudoaneurysm is located along the lateral aspect of the hemostatic clip placed at the time of endoscopy. Due to transverse takeoff of the arterial branch and small caliber of the branch, a microcatheter could not be advanced into the duodenal branch to allow selective embolization of the branch supplying the pseudoaneurysm. Embolization was therefore performed of the gastroduodenal artery itself including across the origin of the offending branch. Gastroduodenal arteriography demonstrates additional normal duodenal branches and a normal gastroepiploic artery. Embolization of the gastroduodenal trunk was performed  successfully nearly to the orifice of the GDA. This resulted in complete occlusion of the artery including the transverse duodenal branch supplying the arterial pseudoaneurysm. There was preserved flow in the hepatic arteries. 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 This is located immediately adjacent to the hemostatic clip placed at the time of endoscopy. The gastroduodenal artery was successfully occluded with embolization coils including across the origin of the abnormal proximal duodenal branch resulting in complete occlusion of the branch supplying the pseudoaneurysm. Electronically Signed   By: Aletta Edouard M.D.   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 Status post endoscopy with placement of single hemostatic clip at the location of most significant here into blood clot. The patient presents for arteriography with probable embolization blood supply to the region of the bleeding ulcer. MEDICATIONS: None ANESTHESIA/SEDATION: The patient was intubated and monitored by Anesthesia during the procedure. He had been intubated for endoscopy immediately prior to the arteriographic procedure and was transported to IR directly from Endoscopy. CONTRAST:  50 mL Omnipaque 300 FLUOROSCOPY TIME:  Fluoroscopy Time: 16 minutes and 54 seconds. 1208 mGy. COMPLICATIONS: None immediate. PROCEDURE: Informed consent was obtained from the patient's daughter following explanation of the procedure, risks, benefits and alternatives. The patient's daughter understands, agrees and consents for the procedure. All questions were addressed. A time out was performed prior to the initiation of the  procedure. Maximal barrier sterile technique utilized including caps, mask, sterile gowns, sterile gloves, large sterile drape, hand hygiene, and chlorhexidine prep. Ultrasound was utilized to confirm patency of the right common femoral artery. Local anesthesia was provided with 1% lidocaine. The right common femoral artery was accessed utilizing a 21 gauge needle and micropuncture set under ultrasound guidance. Ultrasound image documentation was performed. A 5 French sheath was placed over a guidewire. A 5 French Cobra catheter was advanced into the abdominal aorta. Selective arteriography was initially performed of a trunk off of the aorta supplying the right inferior phrenic artery. The catheter was then used to selectively catheterize the celiac axis. Selective arteriography was performed of the celiac axis. The 5 French catheter was further advanced into the common hepatic artery and selective arteriography performed. A microcatheter was then advanced through the 5 French catheter into the gastroduodenal artery and selective arteriography performed. Attempt was made to catheterize a branch of the gastroduodenal artery. Transcatheter embolization was performed of the gastroduodenal artery via the microcatheter with placement of multiple embolization coils. A total of 8 Interlock 0.018 inch microcoils ranging in diameter from 4 mm up to 6 mm of various lengths were placed. Periodic arteriography was performed through the microcatheter during coil embolization. Additional arteriography was then performed at the level of the common hepatic artery via the 5 French Cobra catheter. The catheter was removed. Oblique arteriography was performed at the level of the common femoral access via the sheath. Femoral closure was performed with the Cordis ExoSeal device. FINDINGS: Initial catheterization was performed of a trunk just adjacent to the celiac trunk supplying the right inferior phrenic artery and an additional  left-sided branch that appears to represent an accessory gastric artery. Celiac arteriography demonstrates typical branching pattern with patent splenic, left gastric and common hepatic arteries identified. The gastroduodenal artery emanates off of the common hepatic artery and normally patent proper, right and left hepatic arteries are identified. Common hepatic arteriography demonstrates typical anatomy of the gastroduodenal artery as well as a gastroepiploic artery. In an oblique projection, there was evidence  of an arterial pseudoaneurysm of a gastroduodenal branch representing a transverse proximal branch of the GDA supplying the region of the duodenal bulb/distal stomach. This pseudoaneurysm is located along the lateral aspect of the hemostatic clip placed at the time of endoscopy. Due to transverse takeoff of the arterial branch and small caliber of the branch, a microcatheter could not be advanced into the duodenal branch to allow selective embolization of the branch supplying the pseudoaneurysm. Embolization was therefore performed of the gastroduodenal artery itself including across the origin of the offending branch. Gastroduodenal arteriography demonstrates additional normal duodenal branches and a normal gastroepiploic artery. Embolization of the gastroduodenal trunk was performed successfully nearly to the orifice of the GDA. This resulted in complete occlusion of the artery including the transverse duodenal branch supplying the arterial pseudoaneurysm. There was preserved flow in the hepatic arteries. 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 This is located immediately adjacent to the hemostatic clip placed at the time of endoscopy. The gastroduodenal artery was successfully occluded with embolization coils including across the origin of the  abnormal proximal duodenal branch resulting in complete occlusion of the branch supplying the pseudoaneurysm. Electronically Signed   By: Aletta Edouard M.D.   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 Status post endoscopy with placement of single hemostatic clip at the location of most significant here into blood clot. The patient presents for arteriography with probable embolization blood supply to the region of the bleeding ulcer. MEDICATIONS: None ANESTHESIA/SEDATION: The patient was intubated and monitored by Anesthesia during the procedure. He had been intubated for endoscopy immediately prior to the arteriographic procedure and was transported to IR directly from Endoscopy. CONTRAST:  50 mL Omnipaque 300 FLUOROSCOPY TIME:  Fluoroscopy Time: 16 minutes and 54 seconds. 1208 mGy. COMPLICATIONS: None immediate. PROCEDURE: Informed consent was obtained from the patient's daughter following explanation of the procedure, risks, benefits and alternatives. The patient's daughter understands, agrees and consents for the procedure. All questions were addressed. A time out was performed prior to the initiation of the procedure. Maximal barrier sterile technique utilized including caps, mask, sterile gowns, sterile gloves, large sterile drape, hand hygiene, and chlorhexidine prep. Ultrasound was utilized to confirm patency of the right common femoral artery. Local anesthesia was provided with 1% lidocaine. The right common femoral artery was accessed utilizing a 21 gauge needle and micropuncture set under ultrasound guidance. Ultrasound image documentation was performed. A 5 French sheath was placed over a guidewire. A 5 French Cobra catheter was advanced into the abdominal aorta. Selective arteriography was initially performed of a  trunk off of the aorta supplying the right inferior phrenic artery. The catheter was then used to selectively catheterize the celiac axis. Selective arteriography was performed of the celiac axis. The 5 French catheter was further advanced into the common hepatic artery and selective arteriography performed. A microcatheter was then advanced through the 5 French catheter into the gastroduodenal artery and selective arteriography performed. Attempt was made to catheterize a branch of the gastroduodenal artery. Transcatheter embolization was performed of the gastroduodenal artery via the microcatheter with placement of multiple embolization coils. A total of 8 Interlock 0.018 inch microcoils ranging in diameter from 4 mm up to 6 mm of various lengths were placed. Periodic arteriography was  performed through the microcatheter during coil embolization. Additional arteriography was then performed at the level of the common hepatic artery via the 5 French Cobra catheter. The catheter was removed. Oblique arteriography was performed at the level of the common femoral access via the sheath. Femoral closure was performed with the Cordis ExoSeal device. FINDINGS: Initial catheterization was performed of a trunk just adjacent to the celiac trunk supplying the right inferior phrenic artery and an additional left-sided branch that appears to represent an accessory gastric artery. Celiac arteriography demonstrates typical branching pattern with patent splenic, left gastric and common hepatic arteries identified. The gastroduodenal artery emanates off of the common hepatic artery and normally patent proper, right and left hepatic arteries are identified. Common hepatic arteriography demonstrates typical anatomy of the gastroduodenal artery as well as a gastroepiploic artery. In an oblique projection, there was evidence of an arterial pseudoaneurysm of a gastroduodenal branch representing a transverse proximal branch of the GDA  supplying the region of the duodenal bulb/distal stomach. This pseudoaneurysm is located along the lateral aspect of the hemostatic clip placed at the time of endoscopy. Due to transverse takeoff of the arterial branch and small caliber of the branch, a microcatheter could not be advanced into the duodenal branch to allow selective embolization of the branch supplying the pseudoaneurysm. Embolization was therefore performed of the gastroduodenal artery itself including across the origin of the offending branch. Gastroduodenal arteriography demonstrates additional normal duodenal branches and a normal gastroepiploic artery. Embolization of the gastroduodenal trunk was performed successfully nearly to the orifice of the GDA. This resulted in complete occlusion of the artery including the transverse duodenal branch supplying the arterial pseudoaneurysm. There was preserved flow in the hepatic arteries. 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 This is located immediately adjacent to the hemostatic clip placed at the time of endoscopy. The gastroduodenal artery was successfully occluded with embolization coils including across the origin of the abnormal proximal duodenal branch resulting in complete occlusion of the branch supplying the pseudoaneurysm. Electronically Signed   By: Aletta Edouard M.D.   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 Status post endoscopy with placement of single hemostatic clip at the location of most significant here into blood clot. The patient presents for arteriography with probable embolization blood supply to the region of the bleeding ulcer.  MEDICATIONS: None ANESTHESIA/SEDATION: The patient was intubated and monitored by Anesthesia during the procedure. He had been intubated for endoscopy immediately prior to the arteriographic procedure and was transported to IR directly from Endoscopy. CONTRAST:  50 mL Omnipaque 300 FLUOROSCOPY TIME:  Fluoroscopy Time: 16 minutes and 54 seconds. 1208 mGy. COMPLICATIONS: None immediate. PROCEDURE: Informed consent was obtained from the patient's daughter following explanation of the procedure, risks, benefits and alternatives. The patient's daughter understands, agrees and consents for the procedure. All questions were addressed. A time out was performed prior to the initiation of the procedure. Maximal barrier sterile technique utilized including caps, mask, sterile gowns, sterile gloves, large sterile drape, hand hygiene, and chlorhexidine prep. Ultrasound was utilized to confirm patency of the right common femoral artery. Local anesthesia was provided with 1% lidocaine. The right common femoral artery was accessed utilizing a 21 gauge needle and micropuncture set under ultrasound guidance. Ultrasound image documentation  was performed. A 5 French sheath was placed over a guidewire. A 5 French Cobra catheter was advanced into the abdominal aorta. Selective arteriography was initially performed of a trunk off of the aorta supplying the right inferior phrenic artery. The catheter was then used to selectively catheterize the celiac axis. Selective arteriography was performed of the celiac axis. The 5 French catheter was further advanced into the common hepatic artery and selective arteriography performed. A microcatheter was then advanced through the 5 French catheter into the gastroduodenal artery and selective arteriography performed. Attempt was made to catheterize a branch of the gastroduodenal artery. Transcatheter embolization was performed of the gastroduodenal artery via the microcatheter with placement of  multiple embolization coils. A total of 8 Interlock 0.018 inch microcoils ranging in diameter from 4 mm up to 6 mm of various lengths were placed. Periodic arteriography was performed through the microcatheter during coil embolization. Additional arteriography was then performed at the level of the common hepatic artery via the 5 French Cobra catheter. The catheter was removed. Oblique arteriography was performed at the level of the common femoral access via the sheath. Femoral closure was performed with the Cordis ExoSeal device. FINDINGS: Initial catheterization was performed of a trunk just adjacent to the celiac trunk supplying the right inferior phrenic artery and an additional left-sided branch that appears to represent an accessory gastric artery. Celiac arteriography demonstrates typical branching pattern with patent splenic, left gastric and common hepatic arteries identified. The gastroduodenal artery emanates off of the common hepatic artery and normally patent proper, right and left hepatic arteries are identified. Common hepatic arteriography demonstrates typical anatomy of the gastroduodenal artery as well as a gastroepiploic artery. In an oblique projection, there was evidence of an arterial pseudoaneurysm of a gastroduodenal branch representing a transverse proximal branch of the GDA supplying the region of the duodenal bulb/distal stomach. This pseudoaneurysm is located along the lateral aspect of the hemostatic clip placed at the time of endoscopy. Due to transverse takeoff of the arterial branch and small caliber of the branch, a microcatheter could not be advanced into the duodenal branch to allow selective embolization of the branch supplying the pseudoaneurysm. Embolization was therefore performed of the gastroduodenal artery itself including across the origin of the offending branch. Gastroduodenal arteriography demonstrates additional normal duodenal branches and a normal gastroepiploic  artery. Embolization of the gastroduodenal trunk was performed successfully nearly to the orifice of the GDA. This resulted in complete occlusion of the artery including the transverse duodenal branch supplying the arterial pseudoaneurysm. There was preserved flow in the hepatic arteries. 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 This is located immediately adjacent to the hemostatic clip placed at the time of endoscopy. The gastroduodenal artery was successfully occluded with embolization coils including across the origin of the abnormal proximal duodenal branch resulting in complete occlusion of the branch supplying the pseudoaneurysm. Electronically Signed   By: Aletta Edouard M.D.   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 Status post endoscopy with placement of single hemostatic clip at the location of most significant here into blood clot. The patient presents for arteriography with probable embolization blood supply to the region of the bleeding ulcer. MEDICATIONS: None ANESTHESIA/SEDATION: The patient was intubated and monitored by Anesthesia during the procedure. He  had been intubated for endoscopy immediately prior to the arteriographic procedure and was transported to IR directly from Endoscopy. CONTRAST:  50 mL Omnipaque 300 FLUOROSCOPY TIME:  Fluoroscopy Time: 16 minutes and 54 seconds. 1208 mGy. COMPLICATIONS: None immediate. PROCEDURE: Informed consent was obtained from the patient's daughter following explanation of the procedure, risks, benefits and alternatives. The patient's daughter understands, agrees and consents for the procedure. All questions were addressed. A time out was performed  prior to the initiation of the procedure. Maximal barrier sterile technique utilized including caps, mask, sterile gowns, sterile gloves, large sterile drape, hand hygiene, and chlorhexidine prep. Ultrasound was utilized to confirm patency of the right common femoral artery. Local anesthesia was provided with 1% lidocaine. The right common femoral artery was accessed utilizing a 21 gauge needle and micropuncture set under ultrasound guidance. Ultrasound image documentation was performed. A 5 French sheath was placed over a guidewire. A 5 French Cobra catheter was advanced into the abdominal aorta. Selective arteriography was initially performed of a trunk off of the aorta supplying the right inferior phrenic artery. The catheter was then used to selectively catheterize the celiac axis. Selective arteriography was performed of the celiac axis. The 5 French catheter was further advanced into the common hepatic artery and selective arteriography performed. A microcatheter was then advanced through the 5 French catheter into the gastroduodenal artery and selective arteriography performed. Attempt was made to catheterize a branch of the gastroduodenal artery. Transcatheter embolization was performed of the gastroduodenal artery via the microcatheter with placement of multiple embolization coils. A total of 8 Interlock 0.018 inch microcoils ranging in diameter from 4 mm up to 6 mm of various lengths were placed. Periodic arteriography was performed through the microcatheter during coil embolization. Additional arteriography was then performed at the level of the common hepatic artery via the 5 French Cobra catheter. The catheter was removed. Oblique arteriography was performed at the level of the common femoral access via the sheath. Femoral closure was performed with the Cordis ExoSeal device. FINDINGS: Initial catheterization was performed of a trunk just adjacent to the celiac trunk supplying the right inferior phrenic  artery and an additional left-sided branch that appears to represent an accessory gastric artery. Celiac arteriography demonstrates typical branching pattern with patent splenic, left gastric and common hepatic arteries identified. The gastroduodenal artery emanates off of the common hepatic artery and normally patent proper, right and left hepatic arteries are identified. Common hepatic arteriography demonstrates typical anatomy of the gastroduodenal artery as well as a gastroepiploic artery. In an oblique projection, there was evidence of an arterial pseudoaneurysm of a gastroduodenal branch representing a transverse proximal branch of the GDA supplying the region of the duodenal bulb/distal stomach. This pseudoaneurysm is located along the lateral aspect of the hemostatic clip placed at the time of endoscopy. Due to transverse takeoff of the arterial branch and small caliber of the branch, a microcatheter could not be advanced into the duodenal branch to allow selective embolization of the branch supplying the pseudoaneurysm. Embolization was therefore performed of the gastroduodenal artery itself including across the origin of the offending branch. Gastroduodenal arteriography demonstrates additional normal duodenal branches and a normal gastroepiploic artery. Embolization of the gastroduodenal trunk was performed successfully nearly to the orifice of the GDA. This resulted in complete occlusion of the artery including the transverse duodenal branch supplying the arterial pseudoaneurysm. There was preserved flow in the hepatic arteries. 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 This is located immediately adjacent to the hemostatic clip placed at the time of endoscopy. The gastroduodenal artery was successfully occluded with embolization coils including  across the origin of the abnormal proximal duodenal branch resulting in complete occlusion of the branch supplying the pseudoaneurysm. Electronically Signed   By: Aletta Edouard M.D.   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 Status post endoscopy with placement of single hemostatic clip at the location of most significant here into blood clot. The patient presents for arteriography with probable embolization blood supply to the region of the bleeding ulcer. MEDICATIONS: None ANESTHESIA/SEDATION: The patient was intubated and monitored by Anesthesia during the procedure. He had been intubated for endoscopy immediately prior to the arteriographic procedure and was transported to IR directly from Endoscopy. CONTRAST:  50 mL Omnipaque 300 FLUOROSCOPY TIME:  Fluoroscopy Time: 16 minutes and 54 seconds. 1208 mGy. COMPLICATIONS: None immediate. PROCEDURE: Informed consent was obtained from the patient's daughter following explanation of the procedure, risks, benefits and alternatives. The patient's daughter understands, agrees and consents for the procedure. All questions were addressed. A time out was performed prior to the initiation of the procedure. Maximal barrier sterile technique utilized including caps, mask, sterile gowns, sterile gloves, large sterile drape, hand hygiene, and chlorhexidine prep. Ultrasound was utilized to confirm patency of the right common femoral artery. Local anesthesia was provided with 1% lidocaine. The right common femoral artery was accessed utilizing a 21 gauge needle and micropuncture set under ultrasound guidance. Ultrasound image documentation was performed. A 5 French sheath was placed over a guidewire. A 5 French Cobra catheter was advanced into the abdominal aorta. Selective  arteriography was initially performed of a trunk off of the aorta supplying the right inferior phrenic artery. The catheter was then used to selectively catheterize the celiac axis. Selective arteriography was performed of the celiac axis. The 5 French catheter was further advanced into the common hepatic artery and selective arteriography performed. A microcatheter was then advanced through the 5 French catheter into the gastroduodenal artery and selective arteriography performed. Attempt was made to catheterize a branch of the gastroduodenal artery. Transcatheter embolization was performed of the gastroduodenal artery via the microcatheter with placement of multiple embolization coils. A total of 8 Interlock 0.018 inch microcoils ranging in diameter from 4 mm up to 6 mm of various lengths were placed. Periodic arteriography was performed through the microcatheter during coil embolization. Additional arteriography was then performed at the level of the common hepatic artery via the 5 French Cobra catheter. The catheter was removed. Oblique arteriography was performed at the level of the common femoral access via the sheath. Femoral closure was performed with the Cordis ExoSeal device. FINDINGS: Initial catheterization was performed of a trunk just adjacent to the celiac trunk supplying the right inferior phrenic artery and an additional left-sided branch that appears to represent an accessory gastric artery. Celiac arteriography demonstrates typical branching pattern with patent splenic, left gastric and common hepatic arteries identified. The gastroduodenal artery emanates off of the common hepatic artery and normally patent proper, right and left hepatic arteries are identified. Common hepatic arteriography demonstrates typical anatomy of the gastroduodenal artery as well as a gastroepiploic artery. In an oblique projection, there was evidence of an arterial pseudoaneurysm of a gastroduodenal  branch representing  a transverse proximal branch of the GDA supplying the region of the duodenal bulb/distal stomach. This pseudoaneurysm is located along the lateral aspect of the hemostatic clip placed at the time of endoscopy. Due to transverse takeoff of the arterial branch and small caliber of the branch, a microcatheter could not be advanced into the duodenal branch to allow selective embolization of the branch supplying the pseudoaneurysm. Embolization was therefore performed of the gastroduodenal artery itself including across the origin of the offending branch. Gastroduodenal arteriography demonstrates additional normal duodenal branches and a normal gastroepiploic artery. Embolization of the gastroduodenal trunk was performed successfully nearly to the orifice of the GDA. This resulted in complete occlusion of the artery including the transverse duodenal branch supplying the arterial pseudoaneurysm. There was preserved flow in the hepatic arteries. 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 This is located immediately adjacent to the hemostatic clip placed at the time of endoscopy. The gastroduodenal artery was successfully occluded with embolization coils including across the origin of the abnormal proximal duodenal branch resulting in complete occlusion of the branch supplying the pseudoaneurysm. Electronically Signed   By: Aletta Edouard M.D.   On: 04/30/2019 08:38    Scheduled Meds:  sodium chloride   Intravenous Once   sodium chloride   Intravenous Once   amoxicillin  1,000 mg Oral Q12H   clarithromycin  500 mg Oral Q12H   insulin aspart  0-7 Units Subcutaneous Q4H   mometasone-formoterol  2 puff Inhalation BID   sodium chloride flush  3 mL Intravenous Q12H   sodium chloride flush  3 mL Intravenous Q12H    Continuous Infusions:  sodium  chloride     pantoprozole (PROTONIX) infusion 8 mg/hr (04/30/19 0911)     Time spent: 68mins I have personally reviewed and interpreted on  04/30/2019 daily labs, tele strips, imagings as discussed above under date review session and assessment and plans.  I reviewed all nursing notes, pharmacy notes, consultant notes,  vitals, pertinent old records  I have discussed plan of care as described above with RN , patient and family on 04/30/2019   Florencia Reasons MD, PhD  [facility] Pager [phone]. If 7PM-7AM, please contact night-coverage at [email or web address], password [facility] 04/30/2019, 11:50 AM  LOS: 1 day

## 2019-05-01 LAB — CBC
HCT: 25.5 % — ABNORMAL LOW (ref 39.0–52.0)
Hemoglobin: 8.3 g/dL — ABNORMAL LOW (ref 13.0–17.0)
MCH: 29.3 pg (ref 26.0–34.0)
MCHC: 32.5 g/dL (ref 30.0–36.0)
MCV: 90.1 fL (ref 80.0–100.0)
Platelets: 251 10*3/uL (ref 150–400)
RBC: 2.83 MIL/uL — ABNORMAL LOW (ref 4.22–5.81)
RDW: 17.6 % — ABNORMAL HIGH (ref 11.5–15.5)
WBC: 11.6 10*3/uL — ABNORMAL HIGH (ref 4.0–10.5)
nRBC: 0.7 % — ABNORMAL HIGH (ref 0.0–0.2)

## 2019-05-01 LAB — BASIC METABOLIC PANEL
Anion gap: 7 (ref 5–15)
BUN: 16 mg/dL (ref 8–23)
CO2: 24 mmol/L (ref 22–32)
Calcium: 8.5 mg/dL — ABNORMAL LOW (ref 8.9–10.3)
Chloride: 108 mmol/L (ref 98–111)
Creatinine, Ser: 1.18 mg/dL (ref 0.61–1.24)
GFR calc Af Amer: >60 mL/min (ref >60)
GFR calc non Af Amer: >60 mL/min (ref >60)
Glucose, Bld: 97 mg/dL (ref 70–99)
Potassium: 3.6 mmol/L (ref 3.5–5.1)
Sodium: 139 mmol/L (ref 135–145)

## 2019-05-01 LAB — GLUCOSE, CAPILLARY
Glucose-Capillary: 87 mg/dL (ref 70–99)
Glucose-Capillary: 96 mg/dL (ref 70–99)
Glucose-Capillary: 99 mg/dL (ref 70–99)

## 2019-05-01 MED ORDER — FERROUS SULFATE 325 (65 FE) MG PO TABS: 325.0000 mg | ORAL_TABLET | ORAL | 0 refills | Status: DC

## 2019-05-01 MED ORDER — SUCRALFATE 1 G PO TABS: 1.0000 g | ORAL_TABLET | Freq: Three times a day (TID) | ORAL | 0 refills | Status: DC

## 2019-05-01 MED ORDER — METFORMIN HCL 1000 MG PO TABS: 500.0000 mg | ORAL_TABLET | Freq: Two times a day (BID) | ORAL | 0 refills | Status: DC

## 2019-05-01 MED ORDER — PANTOPRAZOLE SODIUM 40 MG PO TBEC
40.0000 mg | DELAYED_RELEASE_TABLET | Freq: Two times a day (BID) | ORAL | Status: DC
  Administered 2019-05-01: 40 mg via ORAL

## 2019-05-01 MED ORDER — FUROSEMIDE 20 MG PO TABS: 20.0000 mg | ORAL_TABLET | ORAL | 0 refills | Status: DC

## 2019-05-01 MED ORDER — SENNOSIDES-DOCUSATE SODIUM 8.6-50 MG PO TABS: 1.0000 | ORAL_TABLET | Freq: Every day | ORAL | 0 refills | Status: AC

## 2019-05-01 NOTE — Progress Notes (Signed)
   Patient Name: John [name] Date of Encounter: 05/01/2019, 10:07 AM    Subjective  Feels better, no further bleeding. 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 Pylori Continue oral iron Repeat CBC in 1 week Follow-up in GI office with me on May 29th Okay to discharge home from GI standpoint   Damaris Hippo , MD [phone]

## 2019-05-01 NOTE — Progress Notes (Signed)
Two PIV's removed. Catheter intact. Pt for discharge.. D/c instructions given to pt and daughter [name] via phone.

## 2019-05-01 NOTE — Discharge Summary (Addendum)
Discharge Summary  John [name]:[id number] DOB: 1946/11/12  PCP: [name], NP  Admit date: 04/28/2019 Discharge date: 05/01/2019  Time spent: 29mins, more than 50% time spent on coordination of care.  Recommendations for Outpatient Follow-up:  1. F/u with PCP on 5/7  for hospital discharge follow up, repeat cbc/bmp at follow up. 2. F/u with GI on 5/29 3. F/u with oncology on 5/6 4. F/u with neurology for dementia eval on 6/24 with Dr Delice Lesch  5. Home health arranged   Discharge Diagnoses:  Active Hospital Problems   Diagnosis Date Noted   Symptomatic anemia 04/08/2019   Vascular dementia without behavioral disturbance (HCC)    Helicobacter pylori gastritis    Gastrointestinal hemorrhage 04/20/2019   CKD (chronic kidney disease), stage III ([place]) 04/08/2019   Primary cancer of right lower lobe of lung ([place]) 06/14/2015   Chronic respiratory failure with hypoxia (HCC) 12/18/2012   Chronic diastolic CHF (congestive heart failure) ([place]) 02/22/2012    Resolved Hospital Problems  No resolved problems to display.    Discharge Condition: stable  Diet recommendation: heart healthy/carb modified  Filed Weights   04/29/19 1445 04/30/19 0437 05/01/19 0435  Weight: 87.8 kg 87 kg 86.1 kg    History of present illness: (per admitting MD Dr Myna Hidalgo) Patient coming from: home   Chief Complaint: melena, lightheadedness/pre-syncope, gen weakness   HPI: John [name] is a 73 y.o. male with medical history significant for  COPD with chronic hypoxic respiratory failure, chronic diastolic CHF, type 2 diabetes mellitus, lung cancer status post curative radiation, and 2 admissions earlier this month with symptomatic anemia due to GI bleeding, now presenting with recurrence and melena, generalized weakness, lightheadedness, and presyncope.  Patient underwent upper endoscopy 1 week ago with duodenal ulcer that had an adherent clot, ulceration in the prepyloric stomach, and  esophagitis.  He was also found to have H. pylori during the recent admission.  Patient has been undergoing treatment for H. pylori, has been avoiding NSAIDs, but has developed recurrence and melena with recurrent generalized weakness, lightheadedness, and near syncope.  He denies any change in his chronic cough, denies any fevers or chills, denies chest pain, and denies any abdominal pain, nausea, or vomiting.  ED Course: Upon arrival to the ED, patient is found to be afebrile, saturating mid 90s on room air, slightly tachycardic, and with blood pressure 102/54.  EKG features a sinus rhythm and chest x-ray is notable for an unchanged 5 cm right lower lobe mass.  Chemistry panel features a creatinine 1.47, similar to priors.  CBC is notable for slight leukocytosis and a normocytic anemia with hemoglobin 6.7, down from 7.9 four days ago.  Fecal occult blood testing is positive.  Rapid COVID-19 test was sent for unknown indication and was negative.  Patient became hypotensive with systolic in the 42V and had a brief loss of consciousness while standing for orthostatic vitals.  He was given 250 cc normal saline with improvement in blood pressure.  He was started on Protonix infusion.  2 units of packed red blood cells were ordered from the ED.  Gastroenterology was consulted by the ED physician and recommended medical admission.   Hospital Course:  Principal Problem:   Symptomatic anemia Active Problems:   Chronic diastolic CHF (congestive heart failure) (HCC)   Chronic respiratory failure with hypoxia (HCC)   Primary cancer of right lower lobe of lung (HCC)   CKD (chronic kidney disease), stage III (HCC)   Gastrointestinal hemorrhage   Helicobacter  pylori gastritis   Vascular dementia without behavioral disturbance ([place])  1.Symptomatic anemia; recurrent UGIB -Presents with recurrence in melena, gen weakness, light headedness on standing, and pre-syncope -Found to have Hgb of 6.7 in ED, down  from 7.9 four days earlier -He had EGD during recent admission with duodenal ulcer with adherent clot, ulceration of prepyloric stomach, esophagitis -He was also found to have H pylori and his condition was suspected secondary to NSAID's and H pylori -s/p EGD and IR embolization of GDA on 4/30, he is treated with ppi [id number] in the hospital, transitioned to oral PPI bid,  -GI and IR input appreciated -he is cleared to discharge home by GI on ppi bid, carafate, iron supplement, finish abx for H pylori treatment, he is to follow up with pcp and gi.  2.Syncope; orthostatic hypotension -Presents with recurrence in melena, gen weakness, light headedness on standing, and pre-syncope -He became hypotensive and had brief LOC while standing in ED for orthostatic vitals -Secondary to recurrent GIB -home meds beta-blocker/lasix  held in the hospital, he received ivf and prbc transfusion -improved, he is discharged on home dose betablocker  He is discharged on lasix 20mg  MWF ( he was on lasix 20mg  daily PTA).  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  - appear euvolemic at discharge  6.AKI on CKD II -SCr is 1.47 on admission, cr improved , cr 1.18 on discharge -Renally-dose medications  7.Type II DM, noninsulin dependent, controlled - A1c was 5.8% earlier this month  - Managed with metformin at home, held on admission -fasting blood glucose range from 85 to 132, he is discharged on reduced dose metformin, f/u with pcp  8.CAD -No anginal complaints - avoid antiplatelets with GIB  -continue statin, resume betablocker   9. Dementia ?:  Daughter reports patient has been having progressive memory loss for several year, she is interested in neurology referral for dementia evaluation Patient is very pleasant, does appear to have mild dementia, he is not oriented to the month, slightly confused about the location but easily  redirected  Daughter requested to set up home health RN    DVT prophylaxis:SCD's Code Status:Full Family Communication:Discussed with patient , talked to daughter over the phone daily Consults:Gastroenterology   Disposition Plan: home on 5/2 if no more bleeding, able to tolerate diet and hgb stable, needs GI clearance prior to discharge  Daughter desires to set up home health RN  Procedures:  EGD on 4/30  IR GDA arteriography and embolization on 4/30  Antibiotics:  Amoxil/clarithromycin for H pylori   Discharge Exam: BP 117/76 (BP Location: Left Arm)    Pulse 79  Temp 98 F (36.7 C) (Oral)    Resp 15    Ht 5\' 5"  (1.651 m)    Wt 86.1 kg    SpO2 92%    BMI 31.60 kg/m   General: NAD, pleasant, mild confusion Cardiovascular: RRR Respiratory: CTABL  Discharge Instructions You were cared for by a hospitalist during your hospital stay. If you have any questions about your discharge medications or the care you received while you were in the hospital after you are discharged, you can call the unit and asked  to speak with the hospitalist on call if the hospitalist that took care of you is not available. Once you are discharged, your primary care physician will handle any further medical issues. Please note that NO REFILLS for any discharge medications will be authorized once you are discharged, as it is imperative that you return to your primary care physician (or establish a relationship with a primary care physician if you do not have one) for your aftercare needs so that they can reassess your need for medications and monitor your lab values.  Discharge Instructions    Diet - low sodium heart healthy   Complete by:  As directed    Soft diet, carb modified   Increase activity slowly   Complete by:  As directed      Allergies as of 05/01/2019      Reactions   Levaquin [levofloxacin In D5w]    Pt states he is not allergic to medication, last took it about 8 years ago   Lisinopril Swelling      Medication List    TAKE these medications   albuterol 108 (90 Base) MCG/ACT inhaler Commonly known as:  ProAir HFA INHALE 2 PUFFS BY MOUTH EVERY 4 HOURS AS NEEDED FOR WHEEZING What changed:    how much to take  how to take this  when to take this  reasons to take this  additional instructions   allopurinol 300 MG tablet Commonly known as:  ZYLOPRIM Take 300 mg by mouth daily.   amoxicillin 500 MG capsule Commonly known as:  AMOXIL Take 2 capsules (1,000 mg total) by mouth every 12 (twelve) hours for 13 days.   atorvastatin 20 MG tablet Commonly known as:  LIPITOR Take 1 tablet (20 mg total) by mouth daily.   bisoprolol 5 MG tablet Commonly known as:  ZEBETA Take 1 tablet (5 mg total) by mouth daily.   budesonide-formoterol 160-4.5 MCG/ACT inhaler Commonly known as:  Symbicort INHALE 2 PUFFS BY MOUTH EVERY 12 HOURS (FIRST THING IN THE MORNING THEN 12 HOURS LATER) What changed:    how much to take  how to take this  when to take this  reasons to take this  additional  instructions   clarithromycin 500 MG tablet Commonly known as:  BIAXIN Take 1 tablet (500 mg total) by mouth every 12 (twelve) hours for 13 days.   ferrous sulfate 325 (65 FE) MG tablet Take 1 tablet (325 mg total) by mouth every Monday, Wednesday, and Friday. Start taking on:  May 02, 2750   folic acid 1 MG tablet Commonly known as:  FOLVITE Take 1 tablet (1 mg total) by mouth daily.   furosemide 20 MG tablet Commonly known as:  LASIX Take 1 tablet (20 mg total) by mouth every Monday, Wednesday, and Friday. Start taking on:  May 03, 2019 What changed:  when to take this   metFORMIN 1000 MG tablet Commonly known as:  GLUCOPHAGE Take 0.5 tablets (  500 mg total) by mouth 2 (two) times daily. What changed:  how much to take   pantoprazole 40 MG tablet Commonly known as:  PROTONIX Take 1 tablet (40 mg total) by mouth 2 (two) times daily before a meal.   senna-docusate 8.6-50 MG tablet Commonly known as:  Senokot-S Take 1 tablet by mouth at bedtime.   sucralfate 1 g tablet Commonly known as:  Carafate Take 1 tablet (1 g total) by mouth 4 (four) times daily -  with meals and at bedtime for 30 days.   vitamin B-12 1000 MCG tablet Commonly known as:  CYANOCOBALAMIN Take 1 tablet (1,000 mcg total) by mouth daily for 30 days.   Vitamin D (Ergocalciferol) 1.25 MG (50000 UT) Caps capsule Commonly known as:  DRISDOL Take 1 capsule by mouth every Friday.      Allergies  Allergen Reactions   Levaquin [Levofloxacin In D5w]     Pt states he is not allergic to medication, last took it about 8 years ago   Lisinopril Swelling   Follow-up Information    [facility] Follow up in 1 month(s).   Why:  for dementia evaluation Contact information: [address]     [address] [place] [address] [phone]       [name], NP Follow up on 05/06/2019.   Specialty:  Family Medicine Why:  @ 2:00 pm for hospital follow up appointment.  Tele-health visist.  pcp to repeat cbc/bmp at hospital discharge follow up. Contact information: [address] [place] [address] [phone]            The results of significant diagnostics from this hospitalization (including imaging, microbiology, ancillary and laboratory) are listed below for reference.    Significant Diagnostic Studies: Dg Chest 1 View  Result Date: 04/20/2019 CLINICAL DATA:  Fall EXAM: CHEST  1 VIEW COMPARISON:  04/08/2019 FINDINGS: Right lower lobe mass again noted, unchanged since prior study measuring approximately 5.3 cm. No confluent opacity on the left. Heart is normal size. No effusions or acute bony abnormality. IMPRESSION: Stable right lower lobe mass. Cannot exclude neoplasm. This could be further evaluated with chest CT. Electronically Signed   By: Rolm Baptise M.D.   On: 04/20/2019 02:17   Dg Chest 2 View  Result Date: 04/08/2019 CLINICAL DATA:  Initial evaluation for acute syncope. EXAM: CHEST - 2 VIEW COMPARISON:  Prior radiograph from 10/06/2017 FINDINGS: Mild cardiomegaly.  Mediastinal silhouette normal. Lungs normally inflated. There is a 5.4 cm ovoid masslike opacity at the right lower lobe, new from previous. Additional irregular densities within the mid and lower right lung otherwise grossly similar. Minimal linear atelectasis and/or scarring at the left lung base. No other consolidative opacity. No edema or effusion. No pneumothorax. No acute osseous finding. Multilevel degenerative spurring noted throughout the visualized spine. IMPRESSION: 1. New 5.4 cm right lower lobe mass, indeterminate. Further evaluation with dedicated cross-sectional imaging of the chest recommended. 2. Cardiomegaly. Electronically Signed   By: Jeannine Boga M.D.   On: 04/08/2019 03:36   Ct Head Wo Contrast  Result Date: 04/20/2019 CLINICAL DATA:  Fall with headache EXAM: CT HEAD WITHOUT CONTRAST CT CERVICAL SPINE WITHOUT CONTRAST TECHNIQUE: Multidetector  CT imaging of the head and cervical spine was performed following the standard protocol without intravenous contrast. Multiplanar CT image reconstructions of the cervical spine were also generated. COMPARISON:  04/08/2019 head CT FINDINGS: CT HEAD FINDINGS Brain: There is no mass, hemorrhage or extra-axial collection. There is generalized  atrophy without lobar predilection. There is hypoattenuation of the periventricular white matter, most commonly indicating chronic ischemic microangiopathy. Vascular: No abnormal hyperdensity of the major intracranial arteries or dural venous sinuses. No intracranial atherosclerosis. Skull: The visualized skull base, calvarium and extracranial soft tissues are normal. Sinuses/Orbits: No fluid levels or advanced mucosal thickening of the visualized paranasal sinuses. No mastoid or middle ear effusion. The orbits are normal. CT CERVICAL SPINE FINDINGS Alignment: No static subluxation. Facets are aligned. Occipital condyles are normally positioned. Skull base and vertebrae: No acute fracture. Soft tissues and spinal canal: No prevertebral fluid or swelling. No visible canal hematoma. Disc levels: No advanced spinal canal or neural foraminal stenosis. Upper chest: No pneumothorax, pulmonary nodule or pleural effusion. Other: Bilateral carotid bifurcation atherosclerosis. IMPRESSION: 1. Generalized atrophy and findings of chronic ischemic microangiopathy without acute intracranial abnormality. 2. No acute fracture or static subluxation of the cervical spine. Electronically Signed   By: Ulyses Jarred M.D.   On: 04/20/2019 03:10   Ct Head Wo Contrast  Result Date: 04/08/2019 CLINICAL DATA:  Head trauma, minor, GCS>=13, high clinical risk, initial exam. Syncopal episode at home. EXAM: CT HEAD WITHOUT CONTRAST CT CERVICAL SPINE WITHOUT CONTRAST TECHNIQUE: Multidetector CT imaging of the head and cervical spine was performed following the standard protocol without intravenous contrast.  Multiplanar CT image reconstructions of the cervical spine were also generated. COMPARISON:  None. FINDINGS: CT HEAD FINDINGS Brain: No intracranial hemorrhage, mass effect, or midline shift. Age related atrophy. Moderate to advanced chronic small vessel ischemia. No hydrocephalus. The basilar cisterns are patent. No evidence of territorial infarct or acute ischemia. No extra-axial or intracranial fluid collection. Vascular: Atherosclerosis of skullbase vasculature without hyperdense vessel or abnormal calcification. Skull: No fracture or focal lesion. Sinuses/Orbits: Mucosal thickening of the ethmoid air cells. Remote medial left orbital fracture. Frontal sinuses are hypo pneumatized. Mastoid air cells are clear. Other: None. CT CERVICAL SPINE FINDINGS Alignment: Slight straightening of normal lordosis. No traumatic subluxation. Skull base and vertebrae: No acute fracture. Vertebral body heights are maintained. The dens and skull base are intact. Soft tissues and spinal canal: No prevertebral fluid or swelling. No visible canal hematoma. Disc levels: Disc space narrowing and endplate spurring throughout, most prominent at C5-C6. Mild scattered facet arthropathy. Upper chest: No acute findings. Other: Dense carotid calcifications. IMPRESSION: 1. No acute intracranial abnormality. No skull fracture. 2. Age related atrophy. Moderate to advanced chronic small vessel ischemia. 3. Degenerative change in the cervical spine without acute fracture or subluxation. 4. Carotid and skullbase atherosclerosis. Electronically Signed   By: Keith Rake M.D.   On: 04/08/2019 03:32   Ct Chest W Contrast  Result Date: 04/21/2019 CLINICAL DATA:  72 year old male with a duodenal ulcer EXAM: CT CHEST, ABDOMEN, AND PELVIS WITH CONTRAST TECHNIQUE: Multidetector CT imaging of the chest, abdomen and pelvis was performed following the standard protocol during bolus administration of intravenous contrast. CONTRAST:  156mL OMNIPAQUE  IOHEXOL 300 MG/ML  SOLN COMPARISON:  10/04/2016 FINDINGS: CT CHEST FINDINGS Cardiovascular: Cardiomegaly. No pericardial fluid/thickening. Dense calcifications of the left main, left anterior descending, circumflex, right coronary arteries. Normal course caliber and contour of the thoracic aorta. Patent branch vessels. Atherosclerotic changes of the aortic arch and descending thoracic aorta. No aneurysm. Unremarkable visualized pulmonary arteries. Mediastinum/Nodes: Small lymph nodes of the mediastinum. No mediastinal adenopathy. Unremarkable course of the thoracic esophagus. Unremarkable thoracic inlet. Lungs/Pleura: Partially calcified granulomas of the right lung apex, similar to the comparison CT of 04/01/2016 and 10/04/2016. No pneumothorax. No  confluent airspace disease. Increasing volume of soft tissue in the lateral aspect of the right lower lobe, with large pleural attachment, largest axial dimension 4.6 cm. This was in the region of the previously biopsied and treated carcinoma. Increased nodularity on the deep margin of this plate like soft tissue. 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 There is a tail of tissue that extends to the pleura at the inferior margin at the costophrenic sulcus, as well as the tail that appears to extend to the diaphragm. Atelectasis of the dependent aspects of the left lower lobe. CT ABDOMEN PELVIS FINDINGS Hepatobiliary: Unremarkable liver. Calcified gallstones with no inflammatory changes. Pancreas: Unremarkable pancreas Spleen: Unremarkable spleen Adrenals/Urinary Tract: Unremarkable adrenal glands. Left kidney without hydronephrosis or nephrolithiasis. Unremarkable course of the left ureter. Right kidney demonstrates nonobstructing stones in the superior collecting system, largest measuring 8 mm. Partially septated cyst of the lateral cortex. Unremarkable  course of the right ureter. No hydronephrosis or inflammatory changes. Unremarkable urinary bladder. Stomach/Bowel: Stomach decompressed. In the region of the proximal duodenum/pylorus, there is gas and fluid collection involving the wall, which likely corresponds to the ulcer described on the endoscopy report. I do not see a clear fistula to the duodenum that was described, however, duodenum is decompressed. There is no free air or focal fluid. Single small lymph node within the gastrohepatic ligament. Remainder of small bowel unremarkable, decompressed. No focal inflammatory changes. Appendix is not visualized, however, no inflammatory changes are present adjacent to the cecum to indicate an appendicitis. Colon is decompressed with no focal inflammatory changes. Colonic diverticula. Vascular/Lymphatic: Atherosclerotic changes of the abdominal aorta and the bilateral iliac arteries. No retroperitoneal adenopathy. No free fluid. No pelvic adenopathy. No inguinal adenopathy. Reproductive: Transverse diameter of the prostate measures 4.0 cm Other: Fat containing umbilical hernia. Musculoskeletal: No acute displaced fracture. No aggressive lytic lesions or sclerotic lesions. Advanced degenerative changes of the thoracolumbar spine, with flowing anterior osteophyte production of the thoracolumbar spine. Multilevel degenerative changes through the lumbar spine with vacuum disc phenomenon at L3-L4, L4-L5 levels. IMPRESSION: CT demonstrates evidence of pyloric/duodenal ulcer that was described today on endoscopy. No evidence of free air/perforation. There is a single small lymph node within the gastrohepatic ligament, nonspecific, potentially inflammatory/reactive. 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 Referral for oncology evaluation is recommended. Increasing plate like soft tissue at the  periphery of the right lower lobe, abutting the pleura, in the region of previously treated squamous cell carcinoma. Recurrence cannot be excluded. Aortic atherosclerosis and associated coronary artery disease. Aortic Atherosclerosis (ICD10-I70.0). Ancillary findings as above. Electronically Signed   By: Corrie Mckusick D.O.   On: 04/21/2019 20:28   Ct Cervical Spine Wo Contrast  Result Date: 04/20/2019 CLINICAL DATA:  Fall with headache EXAM: CT HEAD WITHOUT CONTRAST CT CERVICAL SPINE WITHOUT CONTRAST TECHNIQUE: Multidetector CT imaging of the head and cervical spine was performed following the standard protocol without intravenous contrast. Multiplanar CT image reconstructions of the cervical spine were also generated. COMPARISON:  04/08/2019 head CT FINDINGS: CT HEAD FINDINGS Brain: There is no mass, hemorrhage or extra-axial collection. There is generalized atrophy without lobar predilection. There is hypoattenuation of the periventricular white matter, most commonly indicating chronic ischemic microangiopathy. Vascular: No abnormal hyperdensity of the major intracranial arteries or dural venous sinuses. No  intracranial atherosclerosis. Skull: The visualized skull base, calvarium and extracranial soft tissues are normal. Sinuses/Orbits: No fluid levels or advanced mucosal thickening of the visualized paranasal sinuses. No mastoid or middle ear effusion. The orbits are normal. CT CERVICAL SPINE FINDINGS Alignment: No static subluxation. Facets are aligned. Occipital condyles are normally positioned. Skull base and vertebrae: No acute fracture. Soft tissues and spinal canal: No prevertebral fluid or swelling. No visible canal hematoma. Disc levels: No advanced spinal canal or neural foraminal stenosis. Upper chest: No pneumothorax, pulmonary nodule or pleural effusion. Other: Bilateral carotid bifurcation atherosclerosis. IMPRESSION: 1. Generalized atrophy and findings of chronic ischemic microangiopathy without  acute intracranial abnormality. 2. No acute fracture or static subluxation of the cervical spine. Electronically Signed   By: Ulyses Jarred M.D.   On: 04/20/2019 03:10   Ct Cervical Spine Wo Contrast  Result Date: 04/08/2019 CLINICAL DATA:  Head trauma, minor, GCS>=13, high clinical risk, initial exam. Syncopal episode at home. EXAM: CT HEAD WITHOUT CONTRAST CT CERVICAL SPINE WITHOUT CONTRAST TECHNIQUE: Multidetector CT imaging of the head and cervical spine was performed following the standard protocol without intravenous contrast. Multiplanar CT image reconstructions of the cervical spine were also generated. COMPARISON:  None. FINDINGS: CT HEAD FINDINGS Brain: No intracranial hemorrhage, mass effect, or midline shift. Age related atrophy. Moderate to advanced chronic small vessel ischemia. No hydrocephalus. The basilar cisterns are patent. No evidence of territorial infarct or acute ischemia. No extra-axial or intracranial fluid collection. Vascular: Atherosclerosis of skullbase vasculature without hyperdense vessel or abnormal calcification. Skull: No fracture or focal lesion. Sinuses/Orbits: Mucosal thickening of the ethmoid air cells. Remote medial left orbital fracture. Frontal sinuses are hypo pneumatized. Mastoid air cells are clear. Other: None. CT CERVICAL SPINE FINDINGS Alignment: Slight straightening of normal lordosis. No traumatic subluxation. Skull base and vertebrae: No acute fracture. Vertebral body heights are maintained. The dens and skull base are intact. Soft tissues and spinal canal: No prevertebral fluid or swelling. No visible canal hematoma. Disc levels: Disc space narrowing and endplate spurring throughout, most prominent at C5-C6. Mild scattered facet arthropathy. Upper chest: No acute findings. Other: Dense carotid calcifications. IMPRESSION: 1. No acute intracranial abnormality. No skull fracture. 2. Age related atrophy. Moderate to advanced chronic small vessel ischemia. 3.  Degenerative change in the cervical spine without acute fracture or subluxation. 4. Carotid and skullbase atherosclerosis. Electronically Signed   By: Keith Rake M.D.   On: 04/08/2019 03:32   Mr Jeri Cos KK Contrast  Result Date: 04/08/2019 CLINICAL DATA:  New lung mass and seizure. EXAM: MRI HEAD WITHOUT AND WITH CONTRAST TECHNIQUE: Multiplanar, multiecho pulse sequences of the brain and surrounding structures were obtained without and with intravenous contrast. CONTRAST:  8 mL Gadavist COMPARISON:  Head CT 04/08/2019 FINDINGS: BRAIN: There is no acute infarct, acute hemorrhage or extra-axial collection. The midline structures are normal. 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 Diffuse confluent hyperintense T2-weighted signal within the periventricular, deep and juxtacortical white matter, most commonly due to chronic ischemic microangiopathy. Generalized atrophy without lobar predilection. Susceptibility-sensitive sequences show no chronic microhemorrhage or superficial siderosis. No abnormal contrast enhancement. VASCULAR: The major intracranial arterial and venous sinus flow voids are normal. SKULL AND UPPER CERVICAL SPINE: Calvarial bone marrow signal is normal. There is no skull base mass. Visualized upper cervical spine and soft tissues are normal. SINUSES/ORBITS: No fluid levels or advanced mucosal thickening. No mastoid or middle ear effusion. The orbits are normal. IMPRESSION: 1. No intracranial metastatic disease. 2. Chronic ischemic microangiopathy. Electronically Signed  By: Ulyses Jarred M.D.   On: 04/08/2019 19:59   Ct Abdomen Pelvis W Contrast  Result Date: 04/21/2019 CLINICAL DATA:  73 year old male with a duodenal ulcer EXAM: CT CHEST, ABDOMEN, AND PELVIS WITH CONTRAST TECHNIQUE: Multidetector CT imaging of the chest, abdomen and pelvis was performed following the standard protocol during bolus administration of intravenous contrast. CONTRAST:  137mL OMNIPAQUE IOHEXOL 300 MG/ML  SOLN  COMPARISON:  10/04/2016 FINDINGS: CT CHEST FINDINGS Cardiovascular: Cardiomegaly. No pericardial fluid/thickening. Dense calcifications of the left main, left anterior descending, circumflex, right coronary arteries. Normal course caliber and contour of the thoracic aorta. Patent branch vessels. Atherosclerotic changes of the aortic arch and descending thoracic aorta. No aneurysm. Unremarkable visualized pulmonary arteries. Mediastinum/Nodes: Small lymph nodes of the mediastinum. No mediastinal adenopathy. Unremarkable course of the thoracic esophagus. Unremarkable thoracic inlet. Lungs/Pleura: Partially calcified granulomas of the right lung apex, similar to the comparison CT of 04/01/2016 and 10/04/2016. No pneumothorax. No confluent airspace disease. Increasing volume of soft tissue in the lateral aspect of the right lower lobe, with large pleural attachment, largest axial dimension 4.6 cm. This was in the region of the previously biopsied and treated carcinoma. Increased nodularity on the deep margin of this plate like soft tissue. 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 There is a tail of tissue that extends to the pleura at the inferior margin at the costophrenic sulcus, as well as the tail that appears to extend to the diaphragm. Atelectasis of the dependent aspects of the left lower lobe. CT ABDOMEN PELVIS FINDINGS Hepatobiliary: Unremarkable liver. Calcified gallstones with no inflammatory changes. Pancreas: Unremarkable pancreas Spleen: Unremarkable spleen Adrenals/Urinary Tract: Unremarkable adrenal glands. Left kidney without hydronephrosis or nephrolithiasis. Unremarkable course of the left ureter. Right kidney demonstrates nonobstructing stones in the superior collecting system, largest measuring 8 mm. Partially septated cyst of the lateral cortex. Unremarkable course of the right ureter.  No hydronephrosis or inflammatory changes. Unremarkable urinary bladder. Stomach/Bowel: Stomach decompressed. In the region of the proximal duodenum/pylorus, there is gas and fluid collection involving the wall, which likely corresponds to the ulcer described on the endoscopy report. I do not see a clear fistula to the duodenum that was described, however, duodenum is decompressed. There is no free air or focal fluid. Single small lymph node within the gastrohepatic ligament. Remainder of small bowel unremarkable, decompressed. No focal inflammatory changes. Appendix is not visualized, however, no inflammatory changes are present adjacent to the cecum to indicate an appendicitis. Colon is decompressed with no focal inflammatory changes. Colonic diverticula. Vascular/Lymphatic: Atherosclerotic changes of the abdominal aorta and the bilateral iliac arteries. No retroperitoneal adenopathy. No free fluid. No pelvic adenopathy. No inguinal adenopathy. Reproductive: Transverse diameter of the prostate measures 4.0 cm Other: Fat containing umbilical hernia. Musculoskeletal: No acute displaced fracture. No aggressive lytic lesions or sclerotic lesions. Advanced degenerative changes of the thoracolumbar spine, with flowing anterior osteophyte production of the thoracolumbar spine. Multilevel degenerative changes through the lumbar spine with vacuum disc phenomenon at L3-L4, L4-L5 levels. IMPRESSION: CT demonstrates evidence of pyloric/duodenal ulcer that was described today on endoscopy. No evidence of free air/perforation. There is a single small lymph node within the gastrohepatic ligament, nonspecific, potentially inflammatory/reactive. 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 Referral for oncology evaluation is recommended. Increasing plate like soft tissue  at the periphery of the right lower  lobe, abutting the pleura, in the region of previously treated squamous cell carcinoma. Recurrence cannot be excluded. Aortic atherosclerosis and associated coronary artery disease. Aortic Atherosclerosis (ICD10-I70.0). Ancillary findings as above. Electronically Signed   By: Corrie Mckusick D.O.   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 Status post endoscopy with placement of single hemostatic clip at the location of most significant here into blood clot. The patient presents for arteriography with probable embolization blood supply to the region of the bleeding ulcer. MEDICATIONS: None ANESTHESIA/SEDATION: The patient was intubated and monitored by Anesthesia during the procedure. He had been intubated for endoscopy immediately prior to the arteriographic procedure and was transported to IR directly from Endoscopy. CONTRAST:  50 mL Omnipaque 300 FLUOROSCOPY TIME:  Fluoroscopy Time: 16 minutes and 54 seconds. 1208 mGy. COMPLICATIONS: None immediate. PROCEDURE: Informed consent was obtained from the patient's daughter following explanation of the procedure, risks, benefits and alternatives. The patient's daughter understands, agrees and consents for the procedure. All questions were addressed. A time out was performed prior to the initiation of the procedure. Maximal barrier sterile technique utilized including caps, mask, sterile gowns, sterile gloves, large sterile drape, hand hygiene, and chlorhexidine prep. Ultrasound was utilized to confirm patency of the right common femoral artery. Local anesthesia was provided with 1% lidocaine. The right common femoral artery was accessed utilizing a 21 gauge needle and micropuncture set under ultrasound guidance. Ultrasound image documentation was performed. A 5 French sheath was placed over a guidewire.  A 5 French Cobra catheter was advanced into the abdominal aorta. Selective arteriography was initially performed of a trunk off of the aorta supplying the right inferior phrenic artery. The catheter was then used to selectively catheterize the celiac axis. Selective arteriography was performed of the celiac axis. The 5 French catheter was further advanced into the common hepatic artery and selective arteriography performed. A microcatheter was then advanced through the 5 French catheter into the gastroduodenal artery and selective arteriography performed. Attempt was made to catheterize a branch of the gastroduodenal artery. Transcatheter embolization was performed of the gastroduodenal artery via the microcatheter with placement of multiple embolization coils. A total of 8 Interlock 0.018 inch microcoils ranging in diameter from 4 mm up to 6 mm of various lengths were placed. Periodic arteriography was performed through the microcatheter during coil embolization. Additional arteriography was then performed at the level of the common hepatic artery via the 5 French Cobra catheter. The catheter was removed. Oblique arteriography was performed at the level of the common femoral access via the sheath. Femoral closure was performed with the Cordis ExoSeal device. FINDINGS: Initial catheterization was performed of a trunk just adjacent to the celiac trunk supplying the right inferior phrenic artery and an additional left-sided branch that appears to represent an accessory gastric artery. Celiac arteriography demonstrates typical branching pattern with patent splenic, left gastric and common hepatic arteries identified. The gastroduodenal artery emanates off of the common hepatic artery and normally patent proper, right and left hepatic arteries are identified. Common hepatic arteriography demonstrates typical anatomy of the gastroduodenal artery as well as a gastroepiploic artery. In an oblique projection, there was  evidence of an arterial pseudoaneurysm of a gastroduodenal branch representing a transverse proximal branch of the GDA supplying the region of the duodenal bulb/distal stomach. This pseudoaneurysm is located along  the lateral aspect of the hemostatic clip placed at the time of endoscopy. Due to transverse takeoff of the arterial branch and small caliber of the branch, a microcatheter could not be advanced into the duodenal branch to allow selective embolization of the branch supplying the pseudoaneurysm. Embolization was therefore performed of the gastroduodenal artery itself including across the origin of the offending branch. Gastroduodenal arteriography demonstrates additional normal duodenal branches and a normal gastroepiploic artery. Embolization of the gastroduodenal trunk was performed successfully nearly to the orifice of the GDA. This resulted in complete occlusion of the artery including the transverse duodenal branch supplying the arterial pseudoaneurysm. There was preserved flow in the hepatic arteries. 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 This is located immediately adjacent to the hemostatic clip placed at the time of endoscopy. The gastroduodenal artery was successfully occluded with embolization coils including across the origin of the abnormal proximal duodenal branch resulting in complete occlusion of the branch supplying the pseudoaneurysm. Electronically Signed   By: Aletta Edouard M.D.   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 Status post endoscopy with placement of single hemostatic clip at the location of most significant here into blood clot. The  patient presents for arteriography with probable embolization blood supply to the region of the bleeding ulcer. MEDICATIONS: None ANESTHESIA/SEDATION: The patient was intubated and monitored by Anesthesia during the procedure. He had been intubated for endoscopy immediately prior to the arteriographic procedure and was transported to IR directly from Endoscopy. CONTRAST:  50 mL Omnipaque 300 FLUOROSCOPY TIME:  Fluoroscopy Time: 16 minutes and 54 seconds. 1208 mGy. COMPLICATIONS: None immediate. PROCEDURE: Informed consent was obtained from the patient's daughter following explanation of the procedure, risks, benefits and alternatives. The patient's daughter understands, agrees and consents for the procedure. All questions were addressed. A time out was performed prior to the initiation of the procedure. Maximal barrier sterile technique utilized including caps, mask, sterile gowns, sterile gloves, large sterile drape, hand hygiene, and chlorhexidine prep. Ultrasound was utilized to confirm patency of the right common femoral artery. Local anesthesia was provided with 1% lidocaine. The right common femoral artery was accessed utilizing a 21 gauge needle and micropuncture set under ultrasound guidance. Ultrasound image documentation was performed. A 5 French sheath was placed over a guidewire. A 5 French Cobra catheter was advanced into the abdominal aorta. Selective arteriography was initially performed of a trunk off of the aorta supplying the right inferior phrenic artery. The catheter was then used to selectively catheterize the celiac axis. Selective arteriography was performed of the celiac axis. The 5 French catheter was further advanced into the common hepatic artery and selective arteriography performed. A microcatheter was then advanced through the 5 French catheter into the gastroduodenal artery and selective arteriography performed. Attempt was made to catheterize a branch of the gastroduodenal artery.  Transcatheter embolization was performed of the gastroduodenal artery via the microcatheter with placement of multiple embolization coils. A total of 8 Interlock 0.018 inch microcoils ranging in diameter from 4 mm up to 6 mm of various lengths were placed. Periodic arteriography was performed through the microcatheter during coil embolization. Additional arteriography was then performed at the level of the common hepatic artery via the 5 French Cobra catheter. The catheter was  removed. Oblique arteriography was performed at the level of the common femoral access via the sheath. Femoral closure was performed with the Cordis ExoSeal device. FINDINGS: Initial catheterization was performed of a trunk just adjacent to the celiac trunk supplying the right inferior phrenic artery and an additional left-sided branch that appears to represent an accessory gastric artery. Celiac arteriography demonstrates typical branching pattern with patent splenic, left gastric and common hepatic arteries identified. The gastroduodenal artery emanates off of the common hepatic artery and normally patent proper, right and left hepatic arteries are identified. Common hepatic arteriography demonstrates typical anatomy of the gastroduodenal artery as well as a gastroepiploic artery. In an oblique projection, there was evidence of an arterial pseudoaneurysm of a gastroduodenal branch representing a transverse proximal branch of the GDA supplying the region of the duodenal bulb/distal stomach. This pseudoaneurysm is located along the lateral aspect of the hemostatic clip placed at the time of endoscopy. Due to transverse takeoff of the arterial branch and small caliber of the branch, a microcatheter could not be advanced into the duodenal branch to allow selective embolization of the branch supplying the pseudoaneurysm. Embolization was therefore performed of the gastroduodenal artery itself including across the origin of the offending branch.  Gastroduodenal arteriography demonstrates additional normal duodenal branches and a normal gastroepiploic artery. Embolization of the gastroduodenal trunk was performed successfully nearly to the orifice of the GDA. This resulted in complete occlusion of the artery including the transverse duodenal branch supplying the arterial pseudoaneurysm. There was preserved flow in the hepatic arteries. 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 This is located immediately adjacent to the hemostatic clip placed at the time of endoscopy. The gastroduodenal artery was successfully occluded with embolization coils including across the origin of the abnormal proximal duodenal branch resulting in complete occlusion of the branch supplying the pseudoaneurysm. Electronically Signed   By: Aletta Edouard M.D.   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 Status post endoscopy with placement of single hemostatic clip at the location of most significant here into blood clot. The patient presents for arteriography with probable embolization blood supply to the region of the bleeding ulcer. MEDICATIONS: None ANESTHESIA/SEDATION: The patient was intubated and monitored by Anesthesia during the procedure. He had been intubated for endoscopy immediately prior to the arteriographic procedure and was transported to IR directly from Endoscopy. CONTRAST:  50 mL Omnipaque 300 FLUOROSCOPY TIME:  Fluoroscopy Time: 16 minutes and 54 seconds. 1208 mGy. COMPLICATIONS: None immediate. PROCEDURE: Informed consent was obtained from the patient's daughter following explanation of the procedure, risks, benefits and  alternatives. The patient's daughter understands, agrees and consents for the procedure. All questions were addressed. A time out was performed prior to the initiation of the procedure. Maximal barrier sterile technique utilized including caps, mask, sterile gowns, sterile gloves, large sterile drape, hand hygiene, and chlorhexidine prep. Ultrasound was utilized to confirm patency of the right common femoral artery. Local anesthesia was provided with 1% lidocaine. The right common femoral artery was accessed utilizing a 21 gauge needle and micropuncture set under ultrasound guidance. Ultrasound image documentation was performed. A 5 French sheath was placed over a guidewire. A 5 French Cobra catheter was advanced into the abdominal aorta. Selective arteriography was initially performed of a  trunk off of the aorta supplying the right inferior phrenic artery. The catheter was then used to selectively catheterize the celiac axis. Selective arteriography was performed of the celiac axis. The 5 French catheter was further advanced into the common hepatic artery and selective arteriography performed. A microcatheter was then advanced through the 5 French catheter into the gastroduodenal artery and selective arteriography performed. Attempt was made to catheterize a branch of the gastroduodenal artery. Transcatheter embolization was performed of the gastroduodenal artery via the microcatheter with placement of multiple embolization coils. A total of 8 Interlock 0.018 inch microcoils ranging in diameter from 4 mm up to 6 mm of various lengths were placed. Periodic arteriography was performed through the microcatheter during coil embolization. Additional arteriography was then performed at the level of the common hepatic artery via the 5 French Cobra catheter. The catheter was removed. Oblique arteriography was performed at the level of the common femoral access via the sheath. Femoral closure was performed with the Cordis  ExoSeal device. FINDINGS: Initial catheterization was performed of a trunk just adjacent to the celiac trunk supplying the right inferior phrenic artery and an additional left-sided branch that appears to represent an accessory gastric artery. Celiac arteriography demonstrates typical branching pattern with patent splenic, left gastric and common hepatic arteries identified. The gastroduodenal artery emanates off of the common hepatic artery and normally patent proper, right and left hepatic arteries are identified. Common hepatic arteriography demonstrates typical anatomy of the gastroduodenal artery as well as a gastroepiploic artery. In an oblique projection, there was evidence of an arterial pseudoaneurysm of a gastroduodenal branch representing a transverse proximal branch of the GDA supplying the region of the duodenal bulb/distal stomach. This pseudoaneurysm is located along the lateral aspect of the hemostatic clip placed at the time of endoscopy. Due to transverse takeoff of the arterial branch and small caliber of the branch, a microcatheter could not be advanced into the duodenal branch to allow selective embolization of the branch supplying the pseudoaneurysm. Embolization was therefore performed of the gastroduodenal artery itself including across the origin of the offending branch. Gastroduodenal arteriography demonstrates additional normal duodenal branches and a normal gastroepiploic artery. Embolization of the gastroduodenal trunk was performed successfully nearly to the orifice of the GDA. This resulted in complete occlusion of the artery including the transverse duodenal branch supplying the arterial pseudoaneurysm. There was preserved flow in the hepatic arteries. 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 This is located immediately adjacent  to the hemostatic clip placed at the time of endoscopy. The gastroduodenal artery was successfully occluded with embolization coils including across the origin of the abnormal proximal duodenal branch resulting in complete occlusion of the branch supplying the pseudoaneurysm. Electronically Signed   By: Aletta Edouard M.D.   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 Status post endoscopy with placement of single hemostatic clip at the location of most significant here into blood clot. The patient presents for arteriography with probable embolization blood supply to the region of the bleeding ulcer. MEDICATIONS: None ANESTHESIA/SEDATION: The patient was intubated and monitored by Anesthesia during the procedure. He had been intubated for endoscopy immediately prior to the arteriographic procedure and was transported to IR directly from Endoscopy. CONTRAST:  50 mL Omnipaque 300 FLUOROSCOPY  TIME:  Fluoroscopy Time: 16 minutes and 54 seconds. 1208 mGy. COMPLICATIONS: None immediate. PROCEDURE: Informed consent was obtained from the patient's daughter following explanation of the procedure, risks, benefits and alternatives. The patient's daughter understands, agrees and consents for the procedure. All questions were addressed. A time out was performed prior to the initiation of the procedure. Maximal barrier sterile technique utilized including caps, mask, sterile gowns, sterile gloves, large sterile drape, hand hygiene, and chlorhexidine prep. Ultrasound was utilized to confirm patency of the right common femoral artery. Local anesthesia was provided with 1% lidocaine. The right common femoral artery was accessed utilizing a 21 gauge needle and micropuncture set under ultrasound guidance. Ultrasound image documentation was performed. A  5 French sheath was placed over a guidewire. A 5 French Cobra catheter was advanced into the abdominal aorta. Selective arteriography was initially performed of a trunk off of the aorta supplying the right inferior phrenic artery. The catheter was then used to selectively catheterize the celiac axis. Selective arteriography was performed of the celiac axis. The 5 French catheter was further advanced into the common hepatic artery and selective arteriography performed. A microcatheter was then advanced through the 5 French catheter into the gastroduodenal artery and selective arteriography performed. Attempt was made to catheterize a branch of the gastroduodenal artery. Transcatheter embolization was performed of the gastroduodenal artery via the microcatheter with placement of multiple embolization coils. A total of 8 Interlock 0.018 inch microcoils ranging in diameter from 4 mm up to 6 mm of various lengths were placed. Periodic arteriography was performed through the microcatheter during coil embolization. Additional arteriography was then performed at the level of the common hepatic artery via the 5 French Cobra catheter. The catheter was removed. Oblique arteriography was performed at the level of the common femoral access via the sheath. Femoral closure was performed with the Cordis ExoSeal device. FINDINGS: Initial catheterization was performed of a trunk just adjacent to the celiac trunk supplying the right inferior phrenic artery and an additional left-sided branch that appears to represent an accessory gastric artery. Celiac arteriography demonstrates typical branching pattern with patent splenic, left gastric and common hepatic arteries identified. The gastroduodenal artery emanates off of the common hepatic artery and normally patent proper, right and left hepatic arteries are identified. Common hepatic arteriography demonstrates typical anatomy of the gastroduodenal artery as well as a gastroepiploic  artery. In an oblique projection, there was evidence of an arterial pseudoaneurysm of a gastroduodenal branch representing a transverse proximal branch of the GDA supplying the region of the duodenal bulb/distal stomach. This pseudoaneurysm is located along the lateral aspect of the hemostatic clip placed at the time of endoscopy. Due to transverse takeoff of the arterial branch and small caliber of the branch, a microcatheter could not be advanced into the duodenal branch to allow selective embolization of the branch supplying the pseudoaneurysm. Embolization was therefore performed of the gastroduodenal artery itself including across the origin of the offending branch. Gastroduodenal arteriography demonstrates additional normal duodenal branches and a normal gastroepiploic artery. Embolization of the gastroduodenal trunk was performed successfully nearly to the orifice of the GDA. This resulted in complete occlusion of the artery including the transverse duodenal branch supplying the arterial pseudoaneurysm. There was preserved flow in the hepatic arteries. 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 This is located immediately adjacent to the hemostatic clip placed at the time of endoscopy. The gastroduodenal  artery was successfully occluded with embolization coils including across the origin of the abnormal proximal duodenal branch resulting in complete occlusion of the branch supplying the pseudoaneurysm. Electronically Signed   By: Aletta Edouard M.D.   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 Status post endoscopy with placement of single hemostatic clip at the location of most significant here into  blood clot. The patient presents for arteriography with probable embolization blood supply to the region of the bleeding ulcer. MEDICATIONS: None ANESTHESIA/SEDATION: The patient was intubated and monitored by Anesthesia during the procedure. He had been intubated for endoscopy immediately prior to the arteriographic procedure and was transported to IR directly from Endoscopy. CONTRAST:  50 mL Omnipaque 300 FLUOROSCOPY TIME:  Fluoroscopy Time: 16 minutes and 54 seconds. 1208 mGy. COMPLICATIONS: None immediate. PROCEDURE: Informed consent was obtained from the patient's daughter following explanation of the procedure, risks, benefits and alternatives. The patient's daughter understands, agrees and consents for the procedure. All questions were addressed. A time out was performed prior to the initiation of the procedure. Maximal barrier sterile technique utilized including caps, mask, sterile gowns, sterile gloves, large sterile drape, hand hygiene, and chlorhexidine prep. Ultrasound was utilized to confirm patency of the right common femoral artery. Local anesthesia was provided with 1% lidocaine. The right common femoral artery was accessed utilizing a 21 gauge needle and micropuncture set under ultrasound guidance. Ultrasound image documentation was performed. A 5 French sheath was placed over a guidewire. A 5 French Cobra catheter was advanced into the abdominal aorta. Selective arteriography was initially performed of a trunk off of the aorta supplying the right inferior phrenic artery. The catheter was then used to selectively catheterize the celiac axis. Selective arteriography was performed of the celiac axis. The 5 French catheter was further advanced into the common hepatic artery and selective arteriography performed. A microcatheter was then advanced through the 5 French catheter into the gastroduodenal artery and selective arteriography performed. Attempt was made to catheterize a branch of the  gastroduodenal artery. Transcatheter embolization was performed of the gastroduodenal artery via the microcatheter with placement of multiple embolization coils. A total of 8 Interlock 0.018 inch microcoils ranging in diameter from 4 mm up to 6 mm of various lengths were placed. Periodic arteriography was performed through the microcatheter during coil embolization. Additional arteriography was then performed at the level of the common hepatic artery via the 5 French Cobra catheter. The catheter was removed. Oblique arteriography was performed at the level of the common femoral access via the sheath. Femoral closure was performed with the Cordis ExoSeal device. FINDINGS: Initial catheterization was performed of a trunk just adjacent to the celiac trunk supplying the right inferior phrenic artery and an additional left-sided branch that appears to represent an accessory gastric artery. Celiac arteriography demonstrates typical branching pattern with patent splenic, left gastric and common hepatic arteries identified. The gastroduodenal artery emanates off of the common hepatic artery and normally patent proper, right and left hepatic arteries are identified. Common hepatic arteriography demonstrates typical anatomy of the gastroduodenal artery as well as a gastroepiploic artery. In an oblique projection, there was evidence of an arterial pseudoaneurysm of a gastroduodenal branch representing a transverse proximal branch of the GDA supplying the region of the duodenal bulb/distal stomach. This pseudoaneurysm is located along the lateral aspect of the hemostatic clip placed at the time of endoscopy.  Due to transverse takeoff of the arterial branch and small caliber of the branch, a microcatheter could not be advanced into the duodenal branch to allow selective embolization of the branch supplying the pseudoaneurysm. Embolization was therefore performed of the gastroduodenal artery itself including across the origin of  the offending branch. Gastroduodenal arteriography demonstrates additional normal duodenal branches and a normal gastroepiploic artery. Embolization of the gastroduodenal trunk was performed successfully nearly to the orifice of the GDA. This resulted in complete occlusion of the artery including the transverse duodenal branch supplying the arterial pseudoaneurysm. There was preserved flow in the hepatic arteries. 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 This is located immediately adjacent to the hemostatic clip placed at the time of endoscopy. The gastroduodenal artery was successfully occluded with embolization coils including across the origin of the abnormal proximal duodenal branch resulting in complete occlusion of the branch supplying the pseudoaneurysm. Electronically Signed   By: Aletta Edouard M.D.   On: 04/30/2019 08:38   Dg Chest Port 1 View  Result Date: 04/28/2019 CLINICAL DATA:  Generalized weakness over the last 3 days. Tremors. Syncopal episode. EXAM: PORTABLE CHEST 1 VIEW COMPARISON:  04/20/2019 FINDINGS: Borderline cardiomegaly. Mediastinal shadows are normal. The left chest is clear. 5 cm mass in the right lower lobe as seen on previous imaging. No evidence new finding. No heart failure or effusion. No significant bone abnormality. IMPRESSION: No change.  5 cm right lower lobe mass.  No other active finding. Electronically Signed   By: Nelson Chimes M.D.   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 Status post endoscopy with placement of single hemostatic clip at the location of most significant here into blood clot. The patient presents for  arteriography with probable embolization blood supply to the region of the bleeding ulcer. MEDICATIONS: None ANESTHESIA/SEDATION: The patient was intubated and monitored by Anesthesia during the procedure. He had been intubated for endoscopy immediately prior to the arteriographic procedure and was transported to IR directly from Endoscopy. CONTRAST:  50 mL Omnipaque 300 FLUOROSCOPY TIME:  Fluoroscopy Time: 16 minutes and 54 seconds. 1208 mGy. COMPLICATIONS: None immediate. PROCEDURE: Informed consent was obtained from the patient's daughter following explanation of the procedure, risks, benefits and alternatives. The patient's daughter understands, agrees and consents for the procedure. All questions were addressed. A time out was performed prior to the initiation of the procedure. Maximal barrier sterile technique utilized including caps, mask, sterile gowns, sterile gloves, large sterile drape, hand hygiene, and chlorhexidine prep. Ultrasound was utilized to confirm patency of the right common femoral artery. Local anesthesia was provided with 1% lidocaine. The right common femoral artery was accessed utilizing a 21 gauge needle and micropuncture set under ultrasound guidance. Ultrasound image documentation was performed. A 5 French sheath was placed over a guidewire. A 5 French Cobra catheter was advanced into the abdominal aorta. Selective arteriography was initially performed of a trunk off of the aorta supplying the right inferior phrenic artery. The catheter was then used to selectively catheterize the celiac axis. Selective arteriography was performed of the celiac axis. The 5 French catheter was further advanced into the common hepatic artery and selective arteriography performed. A microcatheter was then advanced through the 5 [place]  catheter into the gastroduodenal artery and selective arteriography performed. Attempt was made to catheterize a branch of the gastroduodenal artery. Transcatheter  embolization was performed of the gastroduodenal artery via the microcatheter with placement of multiple embolization coils. A total of 8 Interlock 0.018 inch microcoils ranging in diameter from 4 mm up to 6 mm of various lengths were placed. Periodic arteriography was performed through the microcatheter during coil embolization. Additional arteriography was then performed at the level of the common hepatic artery via the 5 French Cobra catheter. The catheter was removed. Oblique arteriography was performed at the level of the common femoral access via the sheath. Femoral closure was performed with the Cordis ExoSeal device. FINDINGS: Initial catheterization was performed of a trunk just adjacent to the celiac trunk supplying the right inferior phrenic artery and an additional left-sided branch that appears to represent an accessory gastric artery. Celiac arteriography demonstrates typical branching pattern with patent splenic, left gastric and common hepatic arteries identified. The gastroduodenal artery emanates off of the common hepatic artery and normally patent proper, right and left hepatic arteries are identified. Common hepatic arteriography demonstrates typical anatomy of the gastroduodenal artery as well as a gastroepiploic artery. In an oblique projection, there was evidence of an arterial pseudoaneurysm of a gastroduodenal branch representing a transverse proximal branch of the GDA supplying the region of the duodenal bulb/distal stomach. This pseudoaneurysm is located along the lateral aspect of the hemostatic clip placed at the time of endoscopy. Due to transverse takeoff of the arterial branch and small caliber of the branch, a microcatheter could not be advanced into the duodenal branch to allow selective embolization of the branch supplying the pseudoaneurysm. Embolization was therefore performed of the gastroduodenal artery itself including across the origin of the offending branch. Gastroduodenal  arteriography demonstrates additional normal duodenal branches and a normal gastroepiploic artery. Embolization of the gastroduodenal trunk was performed successfully nearly to the orifice of the GDA. This resulted in complete occlusion of the artery including the transverse duodenal branch supplying the arterial pseudoaneurysm. There was preserved flow in the hepatic arteries. 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 This is located immediately adjacent to the hemostatic clip placed at the time of endoscopy. The gastroduodenal artery was successfully occluded with embolization coils including across the origin of the abnormal proximal duodenal branch resulting in complete occlusion of the branch supplying the pseudoaneurysm. Electronically Signed   By: Aletta Edouard M.D.   On: 04/30/2019 08:38    Microbiology: Recent Results (from the past 240 hour(s))  SARS Coronavirus 2 [facility] order, Performed in [place] hospital lab)     Status: None   Collection Time: 04/28/19  4:54 PM  Result Value Ref Range Status   SARS Coronavirus 2 NEGATIVE NEGATIVE Final    Comment: (NOTE) If result is NEGATIVE SARS-CoV-2 target nucleic acids are NOT DETECTED. The SARS-CoV-2 RNA is generally detectable in upper and lower  respiratory specimens during the acute phase of infection. The lowest  concentration of SARS-CoV-2 viral copies this assay can detect is 250  copies / mL. A negative result does not preclude SARS-CoV-2 infection  and should not be used as the sole basis for treatment or other  patient management decisions.  A negative result may occur with  improper specimen collection / handling, submission of specimen other  than nasopharyngeal swab, presence of viral mutation(s) within the  areas targeted by  this assay, and inadequate number of viral  copies  (<250 copies / mL). A negative result must be combined with clinical  observations, patient history, and epidemiological information. If result is POSITIVE SARS-CoV-2 target nucleic acids are DETECTED. The SARS-CoV-2 RNA is generally detectable in upper and lower  respiratory specimens dur ing the acute phase of infection.  Positive  results are indicative of active infection with SARS-CoV-2.  Clinical  correlation with patient history and other diagnostic information is  necessary to determine patient infection status.  Positive results do  not rule out bacterial infection or co-infection with other viruses. If result is PRESUMPTIVE POSTIVE SARS-CoV-2 nucleic acids MAY BE PRESENT.   A presumptive positive result was obtained on the submitted specimen  and confirmed on repeat testing.  While 2019 novel coronavirus  (SARS-CoV-2) nucleic acids may be present in the submitted sample  additional confirmatory testing may be necessary for epidemiological  and / or clinical management purposes  to differentiate between  SARS-CoV-2 and other Sarbecovirus currently known to infect humans.  If clinically indicated additional testing with an alternate test  methodology [phone]) is advised. The SARS-CoV-2 RNA is generally  detectable in upper and lower respiratory sp ecimens during the acute  phase of infection. The expected result is Negative. Fact Sheet for Patients:  StrictlyIdeas.no Fact Sheet for Healthcare Providers: BankingDealers.co.za This test is not yet approved or cleared by the [place] FDA and has been authorized for detection and/or diagnosis of SARS-CoV-2 by FDA under an Emergency Use Authorization (EUA).  This EUA will remain in effect (meaning this test can be used) for the duration of the COVID-19 declaration under Section 564(b)(1) of the Act, 21 U.S.C. section 360bbb-3(b)(1), unless the authorization is terminated  or revoked sooner. Performed at [facility], [place] [address], [place],  [address]      Labs: Basic Metabolic Panel: Recent Labs  Lab 04/28/19 1529 04/28/19 1601 04/29/19 0014 04/29/19 1625 04/29/19 1745 04/30/19 0328 05/01/19 0235  NA 137  --  137 136 138 141 139  K 4.9  --  4.2 6.3* 4.3 4.3 3.6  CL 101  --  103  --   --  109 108  CO2 24  --  25  --   --  21* 24  GLUCOSE 132*  --  85 113* 103* 96 97  BUN 29*  --  31*  --   --  28* 16  CREATININE 1.47*  --  1.37*  --   --  1.42* 1.18  CALCIUM 9.0  --  8.7*  --   --  8.8* 8.5*  MG  --  1.8  --   --   --   --   --   PHOS  --  3.6  --   --   --   --   --    Liver Function Tests: Recent Labs  Lab 04/28/19 1529 04/29/19 0014  AST 31 14*  ALT 14 12  ALKPHOS 74 70  BILITOT 0.8 1.2  PROT 6.1* 5.8*  ALBUMIN 2.8* 2.6*   No results for input(s): LIPASE, AMYLASE in the last 168 hours. No results for input(s): AMMONIA in the last 168 hours. CBC: Recent Labs  Lab 04/27/19 1207 04/28/19 1529  04/29/19 1625 04/29/19 1745 04/29/19 2321 04/30/19 0328 05/01/19 0235  WBC 11.2* 10.8*  --   --   --   --  12.4* 11.6*  NEUTROABS  --  9.0*  --   --   --   --   --   --  HGB 7.5 Repeated and verified X2.* 6.7*   < > 8.8* 6.8* 9.1* 8.8* 8.3*  HCT 23.8 Repeated and verified X2.* 22.6*   < > 26.0* 20.0* 27.9* 26.9* 25.5*  MCV 94.6 97.8  --   --   --   --  90.0 90.1  PLT 425.0* 394  --   --   --   --  262 251   < > = values in this interval not displayed.   Cardiac Enzymes: Recent Labs  Lab 04/28/19 1529  TROPONINI <0.03   BNP: BNP (last 3 results) Recent Labs    04/08/19 0310 04/28/19 1529  BNP 61.0 49.5    ProBNP (last 3 results) No results for input(s): PROBNP in the last 8760 hours.  CBG: Recent Labs  Lab 04/30/19 1620 04/30/19 2054 05/01/19 0043 05/01/19 0429 05/01/19 0750  GLUCAP 132* 107* 99 87 96       Signed:  Florencia Reasons MD, PhD  [facility] 05/01/2019, 10:39 AM

## 2019-05-03 ENCOUNTER — Other Ambulatory Visit: Payer: Self-pay

## 2019-05-03 ENCOUNTER — Encounter (HOSPITAL_COMMUNITY): Payer: Self-pay

## 2019-05-04 ENCOUNTER — Other Ambulatory Visit: Payer: Self-pay

## 2019-05-04 ENCOUNTER — Telehealth: Payer: Self-pay

## 2019-05-04 DIAGNOSIS — K264 Chronic or unspecified duodenal ulcer with hemorrhage: Secondary | ICD-10-CM

## 2019-05-04 NOTE — Telephone Encounter (Signed)
Called the patient's home number. Male answering phone the number is not Mr Keeble's number. Called the daughter's home number. Left a message. Called her cell phone number and left a message also. Asked for a call back and briefly what the call was about. Order is in the system for CBC in Dr Woodward Ku name.

## 2019-05-04 NOTE — Telephone Encounter (Signed)
-----   Message from Mauri Pole, MD sent at 05/04/2019 12:53 PM EDT ----- Just do the orders under my name.  He was probably following up from previous hospitalization, patient had 1 more hospitalization and he has follow-up visit scheduled with me. Thank you ----- Message ----- From: Greggory Keen, LPN Sent: 06/08/5071   9:22 AM EDT To: Mauri Pole, MD  Dr Carlean Purl has orders in for a Ferritin and CBC.  Who does the patient belong to? If he is ours, I want to cancel Dr Celesta Aver orders and place the order in your name. Thanks ----- Message ----- From: Mauri Pole, MD Sent: 05/01/2019  10:16 AM EDT To: Greggory Keen, LPN  Please schedule CBC check next Thursday, May 7th.  Thanks

## 2019-05-05 ENCOUNTER — Encounter: Payer: Self-pay | Admitting: Internal Medicine

## 2019-05-05 ENCOUNTER — Other Ambulatory Visit: Payer: Self-pay

## 2019-05-05 ENCOUNTER — Telehealth: Payer: Self-pay | Admitting: Internal Medicine

## 2019-05-05 ENCOUNTER — Inpatient Hospital Stay: Payer: Medicare Other | Attending: Internal Medicine | Admitting: Internal Medicine

## 2019-05-05 VITALS — BP 127/79 | HR 79 | Temp 97.9°F | Resp 18 | Ht 65.0 in | Wt 184.3 lb

## 2019-05-05 DIAGNOSIS — D500 Iron deficiency anemia secondary to blood loss (chronic): Secondary | ICD-10-CM

## 2019-05-05 DIAGNOSIS — I110 Hypertensive heart disease with heart failure: Secondary | ICD-10-CM | POA: Insufficient documentation

## 2019-05-05 DIAGNOSIS — I10 Essential (primary) hypertension: Secondary | ICD-10-CM

## 2019-05-05 DIAGNOSIS — I272 Pulmonary hypertension, unspecified: Secondary | ICD-10-CM

## 2019-05-05 DIAGNOSIS — R5383 Other fatigue: Secondary | ICD-10-CM | POA: Diagnosis not present

## 2019-05-05 DIAGNOSIS — Z923 Personal history of irradiation: Secondary | ICD-10-CM | POA: Insufficient documentation

## 2019-05-05 DIAGNOSIS — Z7984 Long term (current) use of oral hypoglycemic drugs: Secondary | ICD-10-CM | POA: Diagnosis not present

## 2019-05-05 DIAGNOSIS — Z79899 Other long term (current) drug therapy: Secondary | ICD-10-CM | POA: Insufficient documentation

## 2019-05-05 DIAGNOSIS — C3431 Malignant neoplasm of lower lobe, right bronchus or lung: Secondary | ICD-10-CM

## 2019-05-05 DIAGNOSIS — J449 Chronic obstructive pulmonary disease, unspecified: Secondary | ICD-10-CM | POA: Diagnosis not present

## 2019-05-05 DIAGNOSIS — E785 Hyperlipidemia, unspecified: Secondary | ICD-10-CM | POA: Diagnosis not present

## 2019-05-05 DIAGNOSIS — C3490 Malignant neoplasm of unspecified part of unspecified bronchus or lung: Secondary | ICD-10-CM

## 2019-05-05 DIAGNOSIS — K269 Duodenal ulcer, unspecified as acute or chronic, without hemorrhage or perforation: Secondary | ICD-10-CM | POA: Diagnosis not present

## 2019-05-05 DIAGNOSIS — I252 Old myocardial infarction: Secondary | ICD-10-CM

## 2019-05-05 DIAGNOSIS — Z7951 Long term (current) use of inhaled steroids: Secondary | ICD-10-CM

## 2019-05-05 DIAGNOSIS — E119 Type 2 diabetes mellitus without complications: Secondary | ICD-10-CM | POA: Diagnosis not present

## 2019-05-05 NOTE — Telephone Encounter (Signed)
Scheduled appt per 5/6 los - unable to reach patients daughter. Left message with appt date and time

## 2019-05-05 NOTE — Progress Notes (Signed)
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  PRIOR THERAPY: Course of curative radiotherapy to the right lower lobe lung nodule as well as mediastinal lymphadenopathy under the care of Dr. Tammi Klippel.  CURRENT THERAPY: Observation.  INTERVAL HISTORY: John [name] 73 y.o. male returns to the clinic today for evaluation.  The patient was last seen in 2007 lost to follow-up for the last 3 years.  He was admitted to the hospital last month with syncopal episode and was found to have symptomatic anemia with GI blood loss.  He received PRBCs transfusion and had upper endoscopy that showed deep and large nonbleeding duodenal ulcer with adherent clot was high risk for rebleeding.  He is followed by Dr. Carlean Purl from gastroenterology.  During his evaluation he had CT scan of the chest, abdomen and pelvis performed on 04/21/2019 and that showed increasing volume of soft tissue in the lateral aspect of the right lower lobe with large pleural attachment with the largest axial dimension measuring 4.6 cm.  This was in the region of the previously biopsied and treated carcinoma.  In addition there was a new/enlarging irregular mass of the right lower lobe measuring 4.8 cm inferior to the previously treated squamous cell carcinoma.  There is a 10 tissue that extends to the pleura at the inferior margin at the costophrenic sulcus as well as the tail that appears to extend to the diaphragm.  The patient was referred to me today for evaluation and recommendation regarding this new findings.  When seen today he is feeling fine except for fatigue and shortness of breath with exertion.  He denied having any current chest pain, cough or hemoptysis.  He denied having any fever or chills.  He has  no nausea, vomiting, diarrhea or constipation.   MEDICAL HISTORY: Past Medical History:  Diagnosis Date  . B12 deficiency   . CHF (congestive heart failure) ([facility])   . COPD (chronic obstructive pulmonary disease) ([facility])   . Diabetes mellitus without complication ([facility])   . Folate deficiency   . Gout   . Hyperlipemia   . Hypertension   . Lung cancer ([facility])    squamous cell carcinoma of right lower lobe lung  . MI (myocardial infarction) ([facility])   . Pulmonary hypertension ([facility])     ALLERGIES:  is allergic to levaquin [levofloxacin in d5w] and lisinopril.  MEDICATIONS:  Current Outpatient Medications  Medication Sig Dispense Refill  . albuterol (PROAIR HFA) 108 (90 BASE) MCG/ACT inhaler INHALE 2 PUFFS BY MOUTH EVERY 4 HOURS AS NEEDED FOR WHEEZING (Patient taking differently: Inhale 2 puffs into the lungs every 4 (four) hours as needed for wheezing. ) 1 Inhaler 11  . allopurinol (ZYLOPRIM) 300 MG tablet Take 300 mg by mouth daily.    Marland Kitchen amoxicillin (AMOXIL) 500 MG capsule Take 2 capsules (1,000 mg total) by mouth every 12 (twelve) hours for 13 days. 52 capsule 0  . atorvastatin (LIPITOR) 20 MG tablet Take 1 tablet (20 mg total) by mouth daily. 30 tablet 0  . bisoprolol (ZEBETA) 5 MG tablet Take 1 tablet (5 mg total) by mouth daily. 30 tablet 0  . budesonide-formoterol (SYMBICORT) 160-4.5 MCG/ACT inhaler INHALE 2 PUFFS BY MOUTH EVERY 12 HOURS (FIRST THING IN THE MORNING THEN 12 HOURS LATER) (Patient taking  differently: Inhale 2 puffs into the lungs 2 (two) times daily as needed (SOB). ) 1 Inhaler 0  . clarithromycin (BIAXIN) 500 MG tablet Take 1 tablet (500 mg total) by mouth every 12 (twelve) hours for 13 days. 26 tablet 0  . ferrous sulfate 325 (65 FE) MG tablet Take 1 tablet (325 mg total) by mouth every Monday, Wednesday, and Friday. 30 tablet 0  . folic acid (FOLVITE) 1 MG tablet Take 1 tablet (1 mg total) by mouth daily. 30 tablet 11  . furosemide (LASIX) 20 MG tablet Take 1 tablet (20 mg  total) by mouth every Monday, Wednesday, and Friday. 30 tablet 0  . metFORMIN (GLUCOPHAGE) 1000 MG tablet Take 0.5 tablets (500 mg total) by mouth 2 (two) times daily. 60 tablet 0  . pantoprazole (PROTONIX) 40 MG tablet Take 1 tablet (40 mg total) by mouth 2 (two) times daily before a meal. 60 tablet 0  . senna-docusate (SENOKOT-S) 8.6-50 MG tablet Take 1 tablet by mouth at bedtime. 30 tablet 0  . sucralfate (CARAFATE) 1 g tablet Take 1 tablet (1 g total) by mouth 4 (four) times daily -  with meals and at bedtime for 30 days. 120 tablet 0  . vitamin B-12 (CYANOCOBALAMIN) 1000 MCG tablet Take 1 tablet (1,000 mcg total) by mouth daily for 30 days. 30 tablet 0  . Vitamin D, Ergocalciferol, (DRISDOL) 1.25 MG (50000 UT) CAPS capsule Take 1 capsule by mouth every Friday.      No current facility-administered medications for this visit.     SURGICAL HISTORY:  Past Surgical History:  Procedure Laterality Date  . APPENDECTOMY    . BIOPSY  04/21/2019   Procedure: BIOPSY;  Surgeon: Gatha Mayer, MD;  Location: [facility];  Service: Endoscopy;;  . ESOPHAGOGASTRODUODENOSCOPY (EGD) WITH PROPOFOL N/A 04/21/2019   Procedure: ESOPHAGOGASTRODUODENOSCOPY (EGD) WITH PROPOFOL;  Surgeon: Gatha Mayer, MD;  Location: [facility];  Service: Endoscopy;  Laterality: N/A;  . ESOPHAGOGASTRODUODENOSCOPY (EGD) WITH PROPOFOL N/A 04/29/2019   Procedure: ESOPHAGOGASTRODUODENOSCOPY (EGD) WITH PROPOFOL;  Surgeon: Mauri Pole, MD;  Location: [facility];  Service: Endoscopy;  Laterality: N/A;  . HEMOSTASIS CLIP PLACEMENT  04/29/2019   Procedure: HEMOSTASIS CLIP PLACEMENT;  Surgeon: Mauri Pole, MD;  Location: [facility];  Service: Endoscopy;;  Clip placed as marker not for bleeding control  . IR ANGIOGRAM SELECTIVE EACH ADDITIONAL VESSEL  04/29/2019  . IR ANGIOGRAM SELECTIVE EACH ADDITIONAL VESSEL  04/29/2019  . IR ANGIOGRAM SELECTIVE EACH ADDITIONAL VESSEL  04/29/2019  . IR ANGIOGRAM VISCERAL SELECTIVE   04/29/2019  . IR EMBO ART  VEN HEMORR LYMPH EXTRAV  INC GUIDE ROADMAPPING  04/29/2019  . IR US GUIDE VASC ACCESS RIGHT  04/29/2019  . LUNG BIOPSY      REVIEW OF SYSTEMS:  Constitutional: positive for fatigue Eyes: negative Ears, nose, mouth, throat, and face: negative Respiratory: positive for dyspnea on exertion Cardiovascular: negative Gastrointestinal: negative Genitourinary:negative Integument/breast: negative Hematologic/lymphatic: negative Musculoskeletal:negative Neurological: negative Behavioral/Psych: negative Endocrine: negative Allergic/Immunologic: negative   PHYSICAL EXAMINATION: General appearance: alert, cooperative, fatigued and no distress Head: Normocephalic, without obvious abnormality, atraumatic Neck: no adenopathy, no JVD, supple, symmetrical, trachea midline and thyroid not enlarged, symmetric, no tenderness/mass/nodules Lymph nodes: Cervical, supraclavicular, and axillary nodes normal. Resp: clear to auscultation bilaterally Back: symmetric, no curvature. ROM normal. No CVA tenderness. Cardio: regular rate and rhythm, S1, S2 normal, no murmur, click, rub or gallop GI: soft, non-tender; bowel sounds normal; no masses,  no organomegaly Extremities: extremities normal, atraumatic,  no cyanosis or edema Neurologic: Alert and oriented X 3, normal strength and tone. Normal symmetric reflexes. Normal coordination and gait  ECOG PERFORMANCE STATUS: 1 - Symptomatic but completely ambulatory  Blood pressure 127/79, pulse 79, temperature 97.9 F (36.6 C), temperature source Oral, resp. rate 18, height 5\' 5"  (1.651 m), weight 184 lb 4.8 oz (83.6 kg), SpO2 96 %.  LABORATORY DATA: Lab Results  Component Value Date   WBC 11.6 (H) 05/01/2019   HGB 8.3 (L) 05/01/2019   HCT 25.5 (L) 05/01/2019   MCV 90.1 05/01/2019   PLT 251 05/01/2019      Chemistry      Component Value Date/Time   NA 139 05/01/2019 0235   NA 143 10/04/2016 1354   K 3.6 05/01/2019 0235   K 3.9  10/04/2016 1354   CL 108 05/01/2019 0235   CO2 24 05/01/2019 0235   CO2 25 10/04/2016 1354   BUN 16 05/01/2019 0235   BUN 11.6 10/04/2016 1354   CREATININE 1.18 05/01/2019 0235   CREATININE 1.3 10/04/2016 1354      Component Value Date/Time   CALCIUM 8.5 (L) 05/01/2019 0235   CALCIUM 9.5 10/04/2016 1354   ALKPHOS 70 04/29/2019 0014   ALKPHOS 119 10/04/2016 1354   AST 14 (L) 04/29/2019 0014   AST 16 10/04/2016 1354   ALT 12 04/29/2019 0014   ALT 14 10/04/2016 1354   BILITOT 1.2 04/29/2019 0014   BILITOT 0.53 10/04/2016 1354       RADIOGRAPHIC STUDIES: Dg Chest 1 View  Result Date: 04/20/2019 CLINICAL DATA:  Fall EXAM: CHEST  1 VIEW COMPARISON:  04/08/2019 FINDINGS: Right lower lobe mass again noted, unchanged since prior study measuring approximately 5.3 cm. No confluent opacity on the left. Heart is normal size. No effusions or acute bony abnormality. IMPRESSION: Stable right lower lobe mass. Cannot exclude neoplasm. This could be further evaluated with chest CT. Electronically Signed   By: Rolm Baptise M.D.   On: 04/20/2019 02:17   Dg Chest 2 View  Result Date: 04/08/2019 CLINICAL DATA:  Initial evaluation for acute syncope. EXAM: CHEST - 2 VIEW COMPARISON:  Prior radiograph from 10/06/2017 FINDINGS: Mild cardiomegaly.  Mediastinal silhouette normal. Lungs normally inflated. There is a 5.4 cm ovoid masslike opacity at the right lower lobe, new from previous. Additional irregular densities within the mid and lower right lung otherwise grossly similar. Minimal linear atelectasis and/or scarring at the left lung base. No other consolidative opacity. No edema or effusion. No pneumothorax. No acute osseous finding. Multilevel degenerative spurring noted throughout the visualized spine. IMPRESSION: 1. New 5.4 cm right lower lobe mass, indeterminate. Further evaluation with dedicated cross-sectional imaging of the chest recommended. 2. Cardiomegaly. Electronically Signed   By: Jeannine Boga M.D.   On: 04/08/2019 03:36   Ct Head Wo Contrast  Result Date: 04/20/2019 CLINICAL DATA:  Fall with headache EXAM: CT HEAD WITHOUT CONTRAST CT CERVICAL SPINE WITHOUT CONTRAST TECHNIQUE: Multidetector CT imaging of the head and cervical spine was performed following the standard protocol without intravenous contrast. Multiplanar CT image reconstructions of the cervical spine were also generated. COMPARISON:  04/08/2019 head CT FINDINGS: CT HEAD FINDINGS Brain: There is no mass, hemorrhage or extra-axial collection. There is generalized atrophy without lobar predilection. There is hypoattenuation of the periventricular white matter, most commonly indicating chronic ischemic microangiopathy. Vascular: No abnormal hyperdensity of the major intracranial arteries or dural venous sinuses. No intracranial atherosclerosis. Skull: The visualized skull base, calvarium and extracranial soft tissues are  normal. Sinuses/Orbits: No fluid levels or advanced mucosal thickening of the visualized paranasal sinuses. No mastoid or middle ear effusion. The orbits are normal. CT CERVICAL SPINE FINDINGS Alignment: No static subluxation. Facets are aligned. Occipital condyles are normally positioned. Skull base and vertebrae: No acute fracture. Soft tissues and spinal canal: No prevertebral fluid or swelling. No visible canal hematoma. Disc levels: No advanced spinal canal or neural foraminal stenosis. Upper chest: No pneumothorax, pulmonary nodule or pleural effusion. Other: Bilateral carotid bifurcation atherosclerosis. IMPRESSION: 1. Generalized atrophy and findings of chronic ischemic microangiopathy without acute intracranial abnormality. 2. No acute fracture or static subluxation of the cervical spine. Electronically Signed   By: Ulyses Jarred M.D.   On: 04/20/2019 03:10   Ct Head Wo Contrast  Result Date: 04/08/2019 CLINICAL DATA:  Head trauma, minor, GCS>=13, high clinical risk, initial exam. Syncopal episode at  home. EXAM: CT HEAD WITHOUT CONTRAST CT CERVICAL SPINE WITHOUT CONTRAST TECHNIQUE: Multidetector CT imaging of the head and cervical spine was performed following the standard protocol without intravenous contrast. Multiplanar CT image reconstructions of the cervical spine were also generated. COMPARISON:  None. FINDINGS: CT HEAD FINDINGS Brain: No intracranial hemorrhage, mass effect, or midline shift. Age related atrophy. Moderate to advanced chronic small vessel ischemia. No hydrocephalus. The basilar cisterns are patent. No evidence of territorial infarct or acute ischemia. No extra-axial or intracranial fluid collection. Vascular: Atherosclerosis of skullbase vasculature without hyperdense vessel or abnormal calcification. Skull: No fracture or focal lesion. Sinuses/Orbits: Mucosal thickening of the ethmoid air cells. Remote medial left orbital fracture. Frontal sinuses are hypo pneumatized. Mastoid air cells are clear. Other: None. CT CERVICAL SPINE FINDINGS Alignment: Slight straightening of normal lordosis. No traumatic subluxation. Skull base and vertebrae: No acute fracture. Vertebral body heights are maintained. The dens and skull base are intact. Soft tissues and spinal canal: No prevertebral fluid or swelling. No visible canal hematoma. Disc levels: Disc space narrowing and endplate spurring throughout, most prominent at C5-C6. Mild scattered facet arthropathy. Upper chest: No acute findings. Other: Dense carotid calcifications. IMPRESSION: 1. No acute intracranial abnormality. No skull fracture. 2. Age related atrophy. Moderate to advanced chronic small vessel ischemia. 3. Degenerative change in the cervical spine without acute fracture or subluxation. 4. Carotid and skullbase atherosclerosis. Electronically Signed   By: Keith Rake M.D.   On: 04/08/2019 03:32   Ct Chest W Contrast  Result Date: 04/21/2019 CLINICAL DATA:  73 year old male with a duodenal ulcer EXAM: CT CHEST, ABDOMEN, AND  PELVIS WITH CONTRAST TECHNIQUE: Multidetector CT imaging of the chest, abdomen and pelvis was performed following the standard protocol during bolus administration of intravenous contrast. CONTRAST:  125mL OMNIPAQUE IOHEXOL 300 MG/ML  SOLN COMPARISON:  10/04/2016 FINDINGS: CT CHEST FINDINGS Cardiovascular: Cardiomegaly. No pericardial fluid/thickening. Dense calcifications of the left main, left anterior descending, circumflex, right coronary arteries. Normal course caliber and contour of the thoracic aorta. Patent branch vessels. Atherosclerotic changes of the aortic arch and descending thoracic aorta. No aneurysm. Unremarkable visualized pulmonary arteries. Mediastinum/Nodes: Small lymph nodes of the mediastinum. No mediastinal adenopathy. Unremarkable course of the thoracic esophagus. Unremarkable thoracic inlet. Lungs/Pleura: Partially calcified granulomas of the right lung apex, similar to the comparison CT of 04/01/2016 and 10/04/2016. No pneumothorax. No confluent airspace disease. Increasing volume of soft tissue in the lateral aspect of the right lower lobe, with large pleural attachment, largest axial dimension 4.6 cm. This was in the region of the previously biopsied and treated carcinoma. Increased nodularity on the deep margin of  this plate like soft tissue. 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 There is a tail of tissue that extends to the pleura at the inferior margin at the costophrenic sulcus, as well as the tail that appears to extend to the diaphragm. Atelectasis of the dependent aspects of the left lower lobe. CT ABDOMEN PELVIS FINDINGS Hepatobiliary: Unremarkable liver. Calcified gallstones with no inflammatory changes. Pancreas: Unremarkable pancreas Spleen: Unremarkable spleen Adrenals/Urinary Tract: Unremarkable adrenal glands. Left kidney without hydronephrosis or  nephrolithiasis. Unremarkable course of the left ureter. Right kidney demonstrates nonobstructing stones in the superior collecting system, largest measuring 8 mm. Partially septated cyst of the lateral cortex. Unremarkable course of the right ureter. No hydronephrosis or inflammatory changes. Unremarkable urinary bladder. Stomach/Bowel: Stomach decompressed. In the region of the proximal duodenum/pylorus, there is gas and fluid collection involving the wall, which likely corresponds to the ulcer described on the endoscopy report. I do not see a clear fistula to the duodenum that was described, however, duodenum is decompressed. There is no free air or focal fluid. Single small lymph node within the gastrohepatic ligament. Remainder of small bowel unremarkable, decompressed. No focal inflammatory changes. Appendix is not visualized, however, no inflammatory changes are present adjacent to the cecum to indicate an appendicitis. Colon is decompressed with no focal inflammatory changes. Colonic diverticula. Vascular/Lymphatic: Atherosclerotic changes of the abdominal aorta and the bilateral iliac arteries. No retroperitoneal adenopathy. No free fluid. No pelvic adenopathy. No inguinal adenopathy. Reproductive: Transverse diameter of the prostate measures 4.0 cm Other: Fat containing umbilical hernia. Musculoskeletal: No acute displaced fracture. No aggressive lytic lesions or sclerotic lesions. Advanced degenerative changes of the thoracolumbar spine, with flowing anterior osteophyte production of the thoracolumbar spine. Multilevel degenerative changes through the lumbar spine with vacuum disc phenomenon at L3-L4, L4-L5 levels. IMPRESSION: CT demonstrates evidence of pyloric/duodenal ulcer that was described today on endoscopy. No evidence of free air/perforation. There is a single small lymph node within the gastrohepatic ligament, nonspecific, potentially inflammatory/reactive. Right lower lobe mass, compatible with  either recurrent or new bronchogenic carcinoma. 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 Referral for oncology evaluation is recommended. Increasing plate like soft tissue at the periphery of the right lower lobe, abutting the pleura, in the region of previously treated squamous cell carcinoma. Recurrence cannot be excluded. Aortic atherosclerosis and associated coronary artery disease. Aortic Atherosclerosis (ICD10-I70.0). Ancillary findings as above. Electronically Signed   By: Corrie Mckusick D.O.   On: 04/21/2019 20:28   Ct Cervical Spine Wo Contrast  Result Date: 04/20/2019 CLINICAL DATA:  Fall with headache EXAM: CT HEAD WITHOUT CONTRAST CT CERVICAL SPINE WITHOUT CONTRAST TECHNIQUE: Multidetector CT imaging of the head and cervical spine was performed following the standard protocol without intravenous contrast. Multiplanar CT image reconstructions of the cervical spine were also generated. COMPARISON:  04/08/2019 head CT FINDINGS: CT HEAD FINDINGS Brain: There is no mass, hemorrhage or extra-axial collection. There is generalized atrophy without lobar predilection. There is hypoattenuation of the periventricular white matter, most commonly indicating chronic ischemic microangiopathy. Vascular: No abnormal hyperdensity of the major intracranial arteries or dural venous sinuses. No intracranial atherosclerosis. Skull: The visualized skull base, calvarium and extracranial soft tissues are normal. Sinuses/Orbits: No fluid levels or advanced mucosal thickening of the visualized paranasal sinuses. No mastoid or middle ear effusion. The orbits are normal. CT CERVICAL SPINE FINDINGS Alignment: No static subluxation.  Facets are aligned. Occipital condyles are normally positioned. Skull base and vertebrae: No acute fracture. Soft tissues and spinal canal: No prevertebral fluid or swelling. No visible canal hematoma. Disc levels: No advanced spinal canal or neural foraminal  stenosis. Upper chest: No pneumothorax, pulmonary nodule or pleural effusion. Other: Bilateral carotid bifurcation atherosclerosis. IMPRESSION: 1. Generalized atrophy and findings of chronic ischemic microangiopathy without acute intracranial abnormality. 2. No acute fracture or static subluxation of the cervical spine. Electronically Signed   By: Ulyses Jarred M.D.   On: 04/20/2019 03:10   Ct Cervical Spine Wo Contrast  Result Date: 04/08/2019 CLINICAL DATA:  Head trauma, minor, GCS>=13, high clinical risk, initial exam. Syncopal episode at home. EXAM: CT HEAD WITHOUT CONTRAST CT CERVICAL SPINE WITHOUT CONTRAST TECHNIQUE: Multidetector CT imaging of the head and cervical spine was performed following the standard protocol without intravenous contrast. Multiplanar CT image reconstructions of the cervical spine were also generated. COMPARISON:  None. FINDINGS: CT HEAD FINDINGS Brain: No intracranial hemorrhage, mass effect, or midline shift. Age related atrophy. Moderate to advanced chronic small vessel ischemia. No hydrocephalus. The basilar cisterns are patent. No evidence of territorial infarct or acute ischemia. No extra-axial or intracranial fluid collection. Vascular: Atherosclerosis of skullbase vasculature without hyperdense vessel or abnormal calcification. Skull: No fracture or focal lesion. Sinuses/Orbits: Mucosal thickening of the ethmoid air cells. Remote medial left orbital fracture. Frontal sinuses are hypo pneumatized. Mastoid air cells are clear. Other: None. CT CERVICAL SPINE FINDINGS Alignment: Slight straightening of normal lordosis. No traumatic subluxation. Skull base and vertebrae: No acute fracture. Vertebral body heights are maintained. The dens and skull base are intact. Soft tissues and spinal canal: No prevertebral fluid or swelling. No visible canal hematoma. Disc levels: Disc space narrowing and endplate spurring throughout, most prominent at C5-C6. Mild scattered facet arthropathy.  Upper chest: No acute findings. Other: Dense carotid calcifications. IMPRESSION: 1. No acute intracranial abnormality. No skull fracture. 2. Age related atrophy. Moderate to advanced chronic small vessel ischemia. 3. Degenerative change in the cervical spine without acute fracture or subluxation. 4. Carotid and skullbase atherosclerosis. Electronically Signed   By: Keith Rake M.D.   On: 04/08/2019 03:32   Mr Jeri Cos WU Contrast  Result Date: 04/08/2019 CLINICAL DATA:  New lung mass and seizure. EXAM: MRI HEAD WITHOUT AND WITH CONTRAST TECHNIQUE: Multiplanar, multiecho pulse sequences of the brain and surrounding structures were obtained without and with intravenous contrast. CONTRAST:  8 mL Gadavist COMPARISON:  Head CT 04/08/2019 FINDINGS: BRAIN: There is no acute infarct, acute hemorrhage or extra-axial collection. The midline structures are normal. 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 Diffuse confluent hyperintense T2-weighted signal within the periventricular, deep and juxtacortical white matter, most commonly due to chronic ischemic microangiopathy. Generalized atrophy without lobar predilection. Susceptibility-sensitive sequences show no chronic microhemorrhage or superficial siderosis. No abnormal contrast enhancement. VASCULAR: The major intracranial arterial and venous sinus flow voids are normal. SKULL AND UPPER CERVICAL SPINE: Calvarial bone marrow signal is normal. There is no skull base mass. Visualized upper cervical spine and soft tissues are normal. SINUSES/ORBITS: No fluid levels or advanced mucosal thickening. No mastoid or middle ear effusion. The orbits are normal. IMPRESSION: 1. No intracranial metastatic disease. 2. Chronic ischemic microangiopathy. Electronically Signed   By: Ulyses Jarred M.D.   On: 04/08/2019 19:59   Ct Abdomen Pelvis W Contrast  Result Date: 04/21/2019 CLINICAL DATA:  73 year old male with a duodenal ulcer EXAM: CT CHEST, ABDOMEN, AND PELVIS WITH CONTRAST  TECHNIQUE: Multidetector CT imaging  of the chest, abdomen and pelvis was performed following the standard protocol during bolus administration of intravenous contrast. CONTRAST:  17mL OMNIPAQUE IOHEXOL 300 MG/ML  SOLN COMPARISON:  10/04/2016 FINDINGS: CT CHEST FINDINGS Cardiovascular: Cardiomegaly. No pericardial fluid/thickening. Dense calcifications of the left main, left anterior descending, circumflex, right coronary arteries. Normal course caliber and contour of the thoracic aorta. Patent branch vessels. Atherosclerotic changes of the aortic arch and descending thoracic aorta. No aneurysm. Unremarkable visualized pulmonary arteries. Mediastinum/Nodes: Small lymph nodes of the mediastinum. No mediastinal adenopathy. Unremarkable course of the thoracic esophagus. Unremarkable thoracic inlet. Lungs/Pleura: Partially calcified granulomas of the right lung apex, similar to the comparison CT of 04/01/2016 and 10/04/2016. No pneumothorax. No confluent airspace disease. Increasing volume of soft tissue in the lateral aspect of the right lower lobe, with large pleural attachment, largest axial dimension 4.6 cm. This was in the region of the previously biopsied and treated carcinoma. Increased nodularity on the deep margin of this plate like soft tissue. 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 There is a tail of tissue that extends to the pleura at the inferior margin at the costophrenic sulcus, as well as the tail that appears to extend to the diaphragm. Atelectasis of the dependent aspects of the left lower lobe. CT ABDOMEN PELVIS FINDINGS Hepatobiliary: Unremarkable liver. Calcified gallstones with no inflammatory changes. Pancreas: Unremarkable pancreas Spleen: Unremarkable spleen Adrenals/Urinary Tract: Unremarkable adrenal glands. Left kidney without hydronephrosis or nephrolithiasis. Unremarkable  course of the left ureter. Right kidney demonstrates nonobstructing stones in the superior collecting system, largest measuring 8 mm. Partially septated cyst of the lateral cortex. Unremarkable course of the right ureter. No hydronephrosis or inflammatory changes. Unremarkable urinary bladder. Stomach/Bowel: Stomach decompressed. In the region of the proximal duodenum/pylorus, there is gas and fluid collection involving the wall, which likely corresponds to the ulcer described on the endoscopy report. I do not see a clear fistula to the duodenum that was described, however, duodenum is decompressed. There is no free air or focal fluid. Single small lymph node within the gastrohepatic ligament. Remainder of small bowel unremarkable, decompressed. No focal inflammatory changes. Appendix is not visualized, however, no inflammatory changes are present adjacent to the cecum to indicate an appendicitis. Colon is decompressed with no focal inflammatory changes. Colonic diverticula. Vascular/Lymphatic: Atherosclerotic changes of the abdominal aorta and the bilateral iliac arteries. No retroperitoneal adenopathy. No free fluid. No pelvic adenopathy. No inguinal adenopathy. Reproductive: Transverse diameter of the prostate measures 4.0 cm Other: Fat containing umbilical hernia. Musculoskeletal: No acute displaced fracture. No aggressive lytic lesions or sclerotic lesions. Advanced degenerative changes of the thoracolumbar spine, with flowing anterior osteophyte production of the thoracolumbar spine. Multilevel degenerative changes through the lumbar spine with vacuum disc phenomenon at L3-L4, L4-L5 levels. IMPRESSION: CT demonstrates evidence of pyloric/duodenal ulcer that was described today on endoscopy. No evidence of free air/perforation. There is a single small lymph node within the gastrohepatic ligament, nonspecific, potentially inflammatory/reactive. 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 Referral for oncology evaluation is recommended. Increasing plate like soft tissue at the periphery of the right lower lobe, abutting the pleura, in the region of previously treated squamous cell carcinoma. Recurrence cannot be excluded. Aortic atherosclerosis and associated coronary artery disease. Aortic Atherosclerosis (ICD10-I70.0). Ancillary findings as above. Electronically Signed  By: Corrie Mckusick D.O.   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 Status post endoscopy with placement of single hemostatic clip at the location of most significant here into blood clot. The patient presents for arteriography with probable embolization blood supply to the region of the bleeding ulcer. MEDICATIONS: None ANESTHESIA/SEDATION: The patient was intubated and monitored by Anesthesia during the procedure. He had been intubated for endoscopy immediately prior to the arteriographic procedure and was transported to IR directly from Endoscopy. CONTRAST:  50 mL Omnipaque 300 FLUOROSCOPY TIME:  Fluoroscopy Time: 16 minutes and 54 seconds. 1208 mGy. COMPLICATIONS: None immediate. PROCEDURE: Informed consent was obtained from the patient's daughter following explanation of the procedure, risks, benefits and alternatives. The patient's daughter understands, agrees and consents for the procedure. All questions were addressed. A time out was performed prior to the initiation of the procedure. Maximal barrier sterile technique utilized including caps, mask, sterile gowns, sterile gloves, large sterile drape, hand hygiene, and chlorhexidine prep. Ultrasound was utilized to confirm patency of the right common femoral artery. Local anesthesia was provided with  1% lidocaine. The right common femoral artery was accessed utilizing a 21 gauge needle and micropuncture set under ultrasound guidance. Ultrasound image documentation was performed. A 5 French sheath was placed over a guidewire. A 5 French Cobra catheter was advanced into the abdominal aorta. Selective arteriography was initially performed of a trunk off of the aorta supplying the right inferior phrenic artery. The catheter was then used to selectively catheterize the celiac axis. Selective arteriography was performed of the celiac axis. The 5 French catheter was further advanced into the common hepatic artery and selective arteriography performed. A microcatheter was then advanced through the 5 French catheter into the gastroduodenal artery and selective arteriography performed. Attempt was made to catheterize a branch of the gastroduodenal artery. Transcatheter embolization was performed of the gastroduodenal artery via the microcatheter with placement of multiple embolization coils. A total of 8 Interlock 0.018 inch microcoils ranging in diameter from 4 mm up to 6 mm of various lengths were placed. Periodic arteriography was performed through the microcatheter during coil embolization. Additional arteriography was then performed at the level of the common hepatic artery via the 5 French Cobra catheter. The catheter was removed. Oblique arteriography was performed at the level of the common femoral access via the sheath. Femoral closure was performed with the Cordis ExoSeal device. FINDINGS: Initial catheterization was performed of a trunk just adjacent to the celiac trunk supplying the right inferior phrenic artery and an additional left-sided branch that appears to represent an accessory gastric artery. Celiac arteriography demonstrates typical branching pattern with patent splenic, left gastric and common hepatic arteries identified. The gastroduodenal artery emanates off of the common hepatic artery and  normally patent proper, right and left hepatic arteries are identified. Common hepatic arteriography demonstrates typical anatomy of the gastroduodenal artery as well as a gastroepiploic artery. In an oblique projection, there was evidence of an arterial pseudoaneurysm of a gastroduodenal branch representing a transverse proximal branch of the GDA supplying the region of the duodenal bulb/distal stomach. This pseudoaneurysm is located along the lateral aspect of the hemostatic clip placed at the time of endoscopy. Due to transverse takeoff of the arterial branch and small caliber of the branch, a microcatheter could not be advanced into the duodenal branch to allow selective embolization of the  branch supplying the pseudoaneurysm. Embolization was therefore performed of the gastroduodenal artery itself including across the origin of the offending branch. Gastroduodenal arteriography demonstrates additional normal duodenal branches and a normal gastroepiploic artery. Embolization of the gastroduodenal trunk was performed successfully nearly to the orifice of the GDA. This resulted in complete occlusion of the artery including the transverse duodenal branch supplying the arterial pseudoaneurysm. There was preserved flow in the hepatic arteries. 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 This is located immediately adjacent to the hemostatic clip placed at the time of endoscopy. The gastroduodenal artery was successfully occluded with embolization coils including across the origin of the abnormal proximal duodenal branch resulting in complete occlusion of the branch supplying the pseudoaneurysm. Electronically Signed   By: Aletta Edouard M.D.   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 Status post endoscopy with placement of single hemostatic clip at the location of most significant here into blood clot. The patient presents for arteriography with probable embolization blood supply to the region of the bleeding ulcer. MEDICATIONS: None ANESTHESIA/SEDATION: The patient was intubated and monitored by Anesthesia during the procedure. He had been intubated for endoscopy immediately prior to the arteriographic procedure and was transported to IR directly from Endoscopy. CONTRAST:  50 mL Omnipaque 300 FLUOROSCOPY TIME:  Fluoroscopy Time: 16 minutes and 54 seconds. 1208 mGy. COMPLICATIONS: None immediate. PROCEDURE: Informed consent was obtained from the patient's daughter following explanation of the procedure, risks, benefits and alternatives. The patient's daughter understands, agrees and consents for the procedure. All questions were addressed. A time out was performed prior to the initiation of the procedure. Maximal barrier sterile technique utilized including caps, mask, sterile gowns, sterile gloves, large sterile drape, hand hygiene, and chlorhexidine prep. Ultrasound was utilized to confirm patency of the right common femoral artery. Local anesthesia was provided with 1% lidocaine. The right common femoral artery was accessed utilizing a 21 gauge needle and micropuncture set under ultrasound guidance. Ultrasound image documentation was performed. A 5 French sheath was placed over a guidewire. A 5 French Cobra catheter was advanced into the abdominal aorta. Selective arteriography was initially performed of a trunk off of the aorta supplying the right inferior phrenic artery. The catheter was then used to selectively catheterize the celiac axis. Selective arteriography was performed of the celiac axis. The 5 French catheter was further advanced into the common hepatic artery and  selective arteriography performed. A microcatheter was then advanced through the 5 French catheter into the gastroduodenal artery and selective arteriography performed. Attempt was made to catheterize a branch of the gastroduodenal artery. Transcatheter embolization was performed of the gastroduodenal artery via the microcatheter with placement of multiple embolization coils. A total of 8 Interlock 0.018 inch microcoils ranging in diameter from 4 mm up to 6 mm of various lengths were placed. Periodic arteriography was performed through the microcatheter during coil embolization. Additional arteriography was then performed at the level of the common hepatic artery via the 5 French Cobra catheter. The catheter was removed. Oblique arteriography was performed at the level of the common femoral access via the sheath. Femoral closure was performed with the Cordis ExoSeal device. FINDINGS: Initial catheterization was performed of a trunk just adjacent to the celiac trunk supplying the right inferior  phrenic artery and an additional left-sided branch that appears to represent an accessory gastric artery. Celiac arteriography demonstrates typical branching pattern with patent splenic, left gastric and common hepatic arteries identified. The gastroduodenal artery emanates off of the common hepatic artery and normally patent proper, right and left hepatic arteries are identified. Common hepatic arteriography demonstrates typical anatomy of the gastroduodenal artery as well as a gastroepiploic artery. In an oblique projection, there was evidence of an arterial pseudoaneurysm of a gastroduodenal branch representing a transverse proximal branch of the GDA supplying the region of the duodenal bulb/distal stomach. This pseudoaneurysm is located along the lateral aspect of the hemostatic clip placed at the time of endoscopy. Due to transverse takeoff of the arterial branch and small caliber of the branch, a microcatheter could not  be advanced into the duodenal branch to allow selective embolization of the branch supplying the pseudoaneurysm. Embolization was therefore performed of the gastroduodenal artery itself including across the origin of the offending branch. Gastroduodenal arteriography demonstrates additional normal duodenal branches and a normal gastroepiploic artery. Embolization of the gastroduodenal trunk was performed successfully nearly to the orifice of the GDA. This resulted in complete occlusion of the artery including the transverse duodenal branch supplying the arterial pseudoaneurysm. There was preserved flow in the hepatic arteries. 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 This is located immediately adjacent to the hemostatic clip placed at the time of endoscopy. The gastroduodenal artery was successfully occluded with embolization coils including across the origin of the abnormal proximal duodenal branch resulting in complete occlusion of the branch supplying the pseudoaneurysm. Electronically Signed   By: Aletta Edouard M.D.   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 Status post endoscopy with placement of single hemostatic clip at the location of most significant here into blood clot. The patient presents for arteriography with probable embolization blood supply to the region of the bleeding ulcer. MEDICATIONS: None ANESTHESIA/SEDATION: The patient was intubated and monitored by Anesthesia during the procedure. He had been intubated for endoscopy immediately prior to the arteriographic procedure and was transported to IR directly from Endoscopy. CONTRAST:  50 mL Omnipaque 300  FLUOROSCOPY TIME:  Fluoroscopy Time: 16 minutes and 54 seconds. 1208 mGy. COMPLICATIONS: None immediate. PROCEDURE: Informed consent was obtained from the patient's daughter following explanation of the procedure, risks, benefits and alternatives. The patient's daughter understands, agrees and consents for the procedure. All questions were addressed. A time out was performed prior to the initiation of the procedure. Maximal barrier sterile technique utilized including caps, mask, sterile gowns, sterile gloves, large sterile drape, hand hygiene, and chlorhexidine prep. Ultrasound was utilized to confirm patency of the right common femoral artery. Local anesthesia was provided with 1% lidocaine. The right common femoral artery was accessed utilizing a 21 gauge needle and micropuncture set under ultrasound guidance. Ultrasound image documentation was performed. A 5 French sheath was placed over a guidewire. A 5 French Cobra catheter was advanced into the abdominal aorta. Selective arteriography was initially performed of a trunk off of the aorta supplying the right inferior phrenic artery. The catheter was then used to selectively catheterize the celiac axis. Selective arteriography was performed of the celiac axis. The 5 French catheter was further advanced into the common hepatic artery and  selective arteriography performed. A microcatheter was then advanced through the 5 French catheter into the gastroduodenal artery and selective arteriography performed. Attempt was made to catheterize a branch of the gastroduodenal artery. Transcatheter embolization was performed of the gastroduodenal artery via the microcatheter with placement of multiple embolization coils. A total of 8 Interlock 0.018 inch microcoils ranging in diameter from 4 mm up to 6 mm of various lengths were placed. Periodic arteriography was performed through the microcatheter during coil embolization. Additional arteriography was then performed at the  level of the common hepatic artery via the 5 French Cobra catheter. The catheter was removed. Oblique arteriography was performed at the level of the common femoral access via the sheath. Femoral closure was performed with the Cordis ExoSeal device. FINDINGS: Initial catheterization was performed of a trunk just adjacent to the celiac trunk supplying the right inferior phrenic artery and an additional left-sided branch that appears to represent an accessory gastric artery. Celiac arteriography demonstrates typical branching pattern with patent splenic, left gastric and common hepatic arteries identified. The gastroduodenal artery emanates off of the common hepatic artery and normally patent proper, right and left hepatic arteries are identified. Common hepatic arteriography demonstrates typical anatomy of the gastroduodenal artery as well as a gastroepiploic artery. In an oblique projection, there was evidence of an arterial pseudoaneurysm of a gastroduodenal branch representing a transverse proximal branch of the GDA supplying the region of the duodenal bulb/distal stomach. This pseudoaneurysm is located along the lateral aspect of the hemostatic clip placed at the time of endoscopy. Due to transverse takeoff of the arterial branch and small caliber of the branch, a microcatheter could not be advanced into the duodenal branch to allow selective embolization of the branch supplying the pseudoaneurysm. Embolization was therefore performed of the gastroduodenal artery itself including across the origin of the offending branch. Gastroduodenal arteriography demonstrates additional normal duodenal branches and a normal gastroepiploic artery. Embolization of the gastroduodenal trunk was performed successfully nearly to the orifice of the GDA. This resulted in complete occlusion of the artery including the transverse duodenal branch supplying the arterial pseudoaneurysm. There was preserved flow in the hepatic arteries.  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 This is located immediately adjacent to the hemostatic clip placed at the time of endoscopy. The gastroduodenal artery was successfully occluded with embolization coils including across the origin of the abnormal proximal duodenal branch resulting in complete occlusion of the branch supplying the pseudoaneurysm. Electronically Signed   By: Aletta Edouard M.D.   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 Status post endoscopy with placement of single hemostatic clip at the location of most significant here into blood clot. The patient presents for arteriography with probable embolization blood supply to the region of the bleeding ulcer. MEDICATIONS: None ANESTHESIA/SEDATION: The patient was intubated and monitored by Anesthesia during the procedure. He had been intubated for endoscopy immediately prior to the arteriographic procedure and was transported to IR directly from Endoscopy. CONTRAST:  50 mL Omnipaque 300 FLUOROSCOPY TIME:  Fluoroscopy Time: 16 minutes and 54 seconds. 1208 mGy. COMPLICATIONS: None immediate. PROCEDURE: Informed consent was obtained from the patient's daughter following explanation of the procedure, risks, benefits and alternatives. The patient's daughter understands, agrees and consents for the procedure. All  questions were addressed. A time out was performed prior to the initiation of the procedure. Maximal barrier sterile technique utilized including caps, mask, sterile gowns, sterile gloves, large sterile drape, hand hygiene, and chlorhexidine prep. Ultrasound was utilized to confirm patency of the right common  femoral artery. Local anesthesia was provided with 1% lidocaine. The right common femoral artery was accessed utilizing a 21 gauge needle and micropuncture set under ultrasound guidance. Ultrasound image documentation was performed. A 5 French sheath was placed over a guidewire. A 5 French Cobra catheter was advanced into the abdominal aorta. Selective arteriography was initially performed of a trunk off of the aorta supplying the right inferior phrenic artery. The catheter was then used to selectively catheterize the celiac axis. Selective arteriography was performed of the celiac axis. The 5 French catheter was further advanced into the common hepatic artery and selective arteriography performed. A microcatheter was then advanced through the 5 French catheter into the gastroduodenal artery and selective arteriography performed. Attempt was made to catheterize a branch of the gastroduodenal artery. Transcatheter embolization was performed of the gastroduodenal artery via the microcatheter with placement of multiple embolization coils. A total of 8 Interlock 0.018 inch microcoils ranging in diameter from 4 mm up to 6 mm of various lengths were placed. Periodic arteriography was performed through the microcatheter during coil embolization. Additional arteriography was then performed at the level of the common hepatic artery via the 5 French Cobra catheter. The catheter was removed. Oblique arteriography was performed at the level of the common femoral access via the sheath. Femoral closure was performed with the Cordis ExoSeal device. FINDINGS: Initial catheterization was performed of a trunk just adjacent to the celiac trunk supplying the right inferior phrenic artery and an additional left-sided branch that appears to represent an accessory gastric artery. Celiac arteriography demonstrates typical branching pattern with patent splenic, left gastric and common hepatic arteries identified. The gastroduodenal artery  emanates off of the common hepatic artery and normally patent proper, right and left hepatic arteries are identified. Common hepatic arteriography demonstrates typical anatomy of the gastroduodenal artery as well as a gastroepiploic artery. In an oblique projection, there was evidence of an arterial pseudoaneurysm of a gastroduodenal branch representing a transverse proximal branch of the GDA supplying the region of the duodenal bulb/distal stomach. This pseudoaneurysm is located along the lateral aspect of the hemostatic clip placed at the time of endoscopy. Due to transverse takeoff of the arterial branch and small caliber of the branch, a microcatheter could not be advanced into the duodenal branch to allow selective embolization of the branch supplying the pseudoaneurysm. Embolization was therefore performed of the gastroduodenal artery itself including across the origin of the offending branch. Gastroduodenal arteriography demonstrates additional normal duodenal branches and a normal gastroepiploic artery. Embolization of the gastroduodenal trunk was performed successfully nearly to the orifice of the GDA. This resulted in complete occlusion of the artery including the transverse duodenal branch supplying the arterial pseudoaneurysm. There was preserved flow in the hepatic arteries. 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 This is located immediately adjacent to the hemostatic clip placed at the time of endoscopy. The gastroduodenal artery was successfully occluded with embolization coils including across the origin of the abnormal proximal duodenal branch resulting in complete occlusion of the branch supplying the pseudoaneurysm. Electronically Signed   By: Aletta Edouard M.D.   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 Status post endoscopy with placement of single hemostatic clip at the location of most significant here into blood clot. The patient presents for arteriography with probable embolization blood supply to the region of the bleeding ulcer. MEDICATIONS: None ANESTHESIA/SEDATION: The patient was intubated and monitored by Anesthesia during the procedure. He had been intubated for endoscopy immediately prior to the arteriographic procedure and was transported to IR directly from Endoscopy. CONTRAST:  50 mL Omnipaque 300 FLUOROSCOPY TIME:  Fluoroscopy Time: 16 minutes and 54 seconds. 1208 mGy. COMPLICATIONS: None immediate. PROCEDURE: Informed consent was obtained from the patient's daughter following explanation of the procedure, risks, benefits and alternatives. The patient's daughter understands, agrees and consents for the procedure. All questions were addressed. A time out was performed prior to the initiation of the procedure. Maximal barrier sterile technique utilized including caps, mask, sterile gowns, sterile gloves, large sterile drape, hand hygiene, and chlorhexidine prep. Ultrasound was utilized to confirm patency of the right common femoral artery. Local anesthesia was provided with 1% lidocaine. The right common femoral artery was accessed utilizing a 21 gauge needle and micropuncture set under ultrasound guidance. Ultrasound image documentation was performed. A 5 French sheath was placed over a guidewire. A 5 French Cobra catheter was advanced into the abdominal aorta. Selective arteriography was initially performed of a trunk off of the aorta supplying the right inferior phrenic artery. The catheter was then used to selectively catheterize the celiac axis. Selective arteriography was performed of the celiac axis. The 5 French catheter was further advanced into the  common hepatic artery and selective arteriography performed. A microcatheter was then advanced through the 5 French catheter into the gastroduodenal artery and selective arteriography performed. Attempt was made to catheterize a branch of the gastroduodenal artery. Transcatheter embolization was performed of the gastroduodenal artery via the microcatheter with placement of multiple embolization coils. A total of 8 Interlock 0.018 inch microcoils ranging in diameter from 4 mm up to 6 mm of various lengths were placed. Periodic arteriography was performed through the microcatheter during coil embolization. Additional arteriography was then performed at the level of the common hepatic artery via the 5 French Cobra catheter. The catheter was removed. Oblique arteriography was performed at the level of the common femoral access via the sheath. Femoral closure was performed with the Cordis ExoSeal device. FINDINGS: Initial catheterization was performed of a trunk just adjacent to the celiac trunk supplying the right inferior phrenic artery and an additional left-sided branch that appears to represent an accessory gastric artery. Celiac arteriography demonstrates typical branching pattern with patent splenic, left gastric and common hepatic arteries identified. The gastroduodenal artery emanates off of the common hepatic artery and normally patent proper, right and left hepatic arteries are identified. Common hepatic arteriography demonstrates typical anatomy of the gastroduodenal artery as well as a gastroepiploic artery. In an oblique projection, there was evidence of an arterial pseudoaneurysm of a gastroduodenal branch representing a transverse proximal branch of the GDA supplying the region of the duodenal bulb/distal stomach. This pseudoaneurysm is located along the lateral aspect of the hemostatic clip placed at the time of endoscopy. Due to transverse takeoff of the arterial branch and small caliber of the branch,  a microcatheter could not be advanced into the duodenal branch to allow selective embolization of the branch supplying the pseudoaneurysm. Embolization was therefore performed of  the gastroduodenal artery itself including across the origin of the offending branch. Gastroduodenal arteriography demonstrates additional normal duodenal branches and a normal gastroepiploic artery. Embolization of the gastroduodenal trunk was performed successfully nearly to the orifice of the GDA. This resulted in complete occlusion of the artery including the transverse duodenal branch supplying the arterial pseudoaneurysm. There was preserved flow in the hepatic arteries. 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 This is located immediately adjacent to the hemostatic clip placed at the time of endoscopy. The gastroduodenal artery was successfully occluded with embolization coils including across the origin of the abnormal proximal duodenal branch resulting in complete occlusion of the branch supplying the pseudoaneurysm. Electronically Signed   By: Aletta Edouard M.D.   On: 04/30/2019 08:38   Dg Chest Port 1 View  Result Date: 04/28/2019 CLINICAL DATA:  Generalized weakness over the last 3 days. Tremors. Syncopal episode. EXAM: PORTABLE CHEST 1 VIEW COMPARISON:  04/20/2019 FINDINGS: Borderline cardiomegaly. Mediastinal shadows are normal. The left chest is clear. 5 cm mass in the right lower lobe as seen on previous imaging. No evidence new finding. No heart failure or effusion. No significant bone abnormality. IMPRESSION: No change.  5 cm right lower lobe mass.  No other active finding. Electronically Signed   By: Nelson Chimes M.D.   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 Status post endoscopy with placement of single hemostatic clip at the location of most significant here into blood clot. The patient presents for arteriography with probable embolization blood supply to the region of the bleeding ulcer. MEDICATIONS: None ANESTHESIA/SEDATION: The patient was intubated and monitored by Anesthesia during the procedure. He had been intubated for endoscopy immediately prior to the arteriographic procedure and was transported to IR directly from Endoscopy. CONTRAST:  50 mL Omnipaque 300 FLUOROSCOPY TIME:  Fluoroscopy Time: 16 minutes and 54 seconds. 1208 mGy. COMPLICATIONS: None immediate. PROCEDURE: Informed consent was obtained from the patient's daughter following explanation of the procedure, risks, benefits and alternatives. The patient's daughter understands, agrees and consents for the procedure. All questions were addressed. A time out was performed prior to the initiation of the procedure. Maximal barrier sterile technique utilized including caps, mask, sterile gowns, sterile gloves, large sterile drape, hand hygiene, and chlorhexidine prep. Ultrasound was utilized to confirm patency of the right common femoral artery. Local anesthesia was provided with 1% lidocaine. The right common femoral artery was accessed utilizing a 21 gauge needle and micropuncture set under ultrasound guidance. Ultrasound image documentation was performed. A 5 French sheath was placed over a guidewire. A 5 French Cobra catheter was advanced into the abdominal aorta. Selective arteriography was initially performed of a trunk off of the aorta supplying the right inferior phrenic artery. The catheter was then used to selectively catheterize the celiac axis. Selective arteriography was performed of the celiac axis. The 5 French catheter was further advanced into the common hepatic artery and selective  arteriography performed. A microcatheter was then advanced through the 5 French catheter into the gastroduodenal artery and selective arteriography performed. Attempt was made to catheterize a branch of the gastroduodenal artery. Transcatheter embolization was performed of the gastroduodenal artery via the microcatheter with placement of multiple embolization coils. A total  of 8 Interlock 0.018 inch microcoils ranging in diameter from 4 mm up to 6 mm of various lengths were placed. Periodic arteriography was performed through the microcatheter during coil embolization. Additional arteriography was then performed at the level of the common hepatic artery via the 5 French Cobra catheter. The catheter was removed. Oblique arteriography was performed at the level of the common femoral access via the sheath. Femoral closure was performed with the Cordis ExoSeal device. FINDINGS: Initial catheterization was performed of a trunk just adjacent to the celiac trunk supplying the right inferior phrenic artery and an additional left-sided branch that appears to represent an accessory gastric artery. Celiac arteriography demonstrates typical branching pattern with patent splenic, left gastric and common hepatic arteries identified. The gastroduodenal artery emanates off of the common hepatic artery and normally patent proper, right and left hepatic arteries are identified. Common hepatic arteriography demonstrates typical anatomy of the gastroduodenal artery as well as a gastroepiploic artery. In an oblique projection, there was evidence of an arterial pseudoaneurysm of a gastroduodenal branch representing a transverse proximal branch of the GDA supplying the region of the duodenal bulb/distal stomach. This pseudoaneurysm is located along the lateral aspect of the hemostatic clip placed at the time of endoscopy. Due to transverse takeoff of the arterial branch and small caliber of the branch, a microcatheter could not be  advanced into the duodenal branch to allow selective embolization of the branch supplying the pseudoaneurysm. Embolization was therefore performed of the gastroduodenal artery itself including across the origin of the offending branch. Gastroduodenal arteriography demonstrates additional normal duodenal branches and a normal gastroepiploic artery. Embolization of the gastroduodenal trunk was performed successfully nearly to the orifice of the GDA. This resulted in complete occlusion of the artery including the transverse duodenal branch supplying the arterial pseudoaneurysm. There was preserved flow in the hepatic arteries. 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 This is located immediately adjacent to the hemostatic clip placed at the time of endoscopy. The gastroduodenal artery was successfully occluded with embolization coils including across the origin of the abnormal proximal duodenal branch resulting in complete occlusion of the branch supplying the pseudoaneurysm. Electronically Signed   By: Aletta Edouard M.D.   On: 04/30/2019 08:38    ASSESSMENT AND PLAN: This is a very pleasant 73 years old African-American male with stage IA non-small cell lung cancer, squamous cell carcinoma diagnosed in June 2016 status post curative radiotherapy with partial response.  The patient has been on observation since that time. He was lost to follow-up since 2017. The patient presented with new findings on recent CT scan of the chest including annual right lower lobe lung mass as well as increasing size of the previous lesion in the right lower lobe. I personally and independently reviewed the scan images and discussed the results with the patient today. His scan is concerning for disease recurrence. I recommended for the patient to have a PET scan for further  evaluation of the new nodule.  Based on the findings on the PET scan will decide whether to proceed with a biopsy of referred the patient back to Dr. Tammi Klippel for consideration of stereotactic radiotherapy. I will see the patient back for follow-up visit in 2 weeks for reevaluation and discussion of his treatment options based on the PET scan results. For the history of duodenal ulcer, he is followed by Dr. Carlean Purl. The patient was advised  to call immediately if he has any concerning symptoms in the interval. The patient voices understanding of current disease status and treatment options and is in agreement with the current care plan.  All questions were answered. The patient knows to call the clinic with any problems, questions or concerns. We can certainly see the patient much sooner if necessary.  Disclaimer: This note was dictated with voice recognition software. Similar sounding words can inadvertently be transcribed and may not be corrected upon review.

## 2019-05-18 ENCOUNTER — Telehealth: Payer: Self-pay | Admitting: Medical Oncology

## 2019-05-18 NOTE — Telephone Encounter (Signed)
Obtained HIPPA information from pt that we can only speak to his daughter John [name] about his medical infomation. Appt-Pt and Lynette notified that his appt for tomorrow has been cancelled and will be r/s to after PET scan.

## 2019-05-18 NOTE — Telephone Encounter (Signed)
Spoke with Janett Billow. Agrees to the virtual visit. Patient had CBC done through his PCP on 05/04/19

## 2019-05-19 ENCOUNTER — Inpatient Hospital Stay: Payer: Medicare Other | Admitting: Internal Medicine

## 2019-05-19 ENCOUNTER — Telehealth: Payer: Self-pay | Admitting: Internal Medicine

## 2019-05-19 NOTE — Telephone Encounter (Signed)
Scheduled appt per sch msg. Called Lynette to confirm.

## 2019-05-26 ENCOUNTER — Ambulatory Visit (HOSPITAL_COMMUNITY)
Admission: RE | Admit: 2019-05-26 | Discharge: 2019-05-26 | Disposition: A | Discharge status: Home / Self Care | Payer: Medicare Other | Source: Ambulatory Visit | Attending: Internal Medicine | Admitting: Internal Medicine

## 2019-05-26 ENCOUNTER — Other Ambulatory Visit: Payer: Self-pay

## 2019-05-26 DIAGNOSIS — I700 Atherosclerosis of aorta: Secondary | ICD-10-CM | POA: Insufficient documentation

## 2019-05-26 DIAGNOSIS — I2510 Atherosclerotic heart disease of native coronary artery without angina pectoris: Secondary | ICD-10-CM | POA: Insufficient documentation

## 2019-05-26 DIAGNOSIS — K8020 Calculus of gallbladder without cholecystitis without obstruction: Secondary | ICD-10-CM | POA: Insufficient documentation

## 2019-05-26 DIAGNOSIS — N200 Calculus of kidney: Secondary | ICD-10-CM | POA: Insufficient documentation

## 2019-05-26 DIAGNOSIS — J439 Emphysema, unspecified: Secondary | ICD-10-CM | POA: Insufficient documentation

## 2019-05-26 DIAGNOSIS — C3490 Malignant neoplasm of unspecified part of unspecified bronchus or lung: Secondary | ICD-10-CM | POA: Insufficient documentation

## 2019-05-26 LAB — GLUCOSE, CAPILLARY: Glucose-Capillary: 139 mg/dL — ABNORMAL HIGH (ref 70–99)

## 2019-05-26 MED ORDER — FLUDEOXYGLUCOSE F - 18 (FDG) INJECTION
8.7600 | Freq: Once | INTRAVENOUS | Status: AC | PRN
  Administered 2019-05-26: 16:00:00 8.76 via INTRAVENOUS

## 2019-05-27 ENCOUNTER — Encounter: Payer: Self-pay | Admitting: General Surgery

## 2019-05-28 ENCOUNTER — Other Ambulatory Visit: Payer: Self-pay

## 2019-05-28 ENCOUNTER — Ambulatory Visit: Payer: Medicare Other | Admitting: Gastroenterology

## 2019-05-28 ENCOUNTER — Telehealth: Payer: Self-pay

## 2019-05-28 ENCOUNTER — Other Ambulatory Visit (INDEPENDENT_AMBULATORY_CARE_PROVIDER_SITE_OTHER): Payer: Medicare Other

## 2019-05-28 ENCOUNTER — Ambulatory Visit (INDEPENDENT_AMBULATORY_CARE_PROVIDER_SITE_OTHER): Payer: Medicare Other | Admitting: Gastroenterology

## 2019-05-28 VITALS — Ht 64.0 in | Wt 196.0 lb

## 2019-05-28 DIAGNOSIS — K264 Chronic or unspecified duodenal ulcer with hemorrhage: Secondary | ICD-10-CM

## 2019-05-28 DIAGNOSIS — D500 Iron deficiency anemia secondary to blood loss (chronic): Secondary | ICD-10-CM | POA: Diagnosis not present

## 2019-05-28 DIAGNOSIS — A048 Other specified bacterial intestinal infections: Secondary | ICD-10-CM | POA: Diagnosis not present

## 2019-05-28 DIAGNOSIS — K269 Duodenal ulcer, unspecified as acute or chronic, without hemorrhage or perforation: Secondary | ICD-10-CM

## 2019-05-28 LAB — CBC WITH DIFFERENTIAL/PLATELET
Basophils Absolute: 0.1 10*3/uL (ref 0.0–0.1)
Basophils Relative: 0.8 % (ref 0.0–3.0)
Eosinophils Absolute: 0.2 10*3/uL (ref 0.0–0.7)
Eosinophils Relative: 2.5 % (ref 0.0–5.0)
HCT: 30.7 % — ABNORMAL LOW (ref 39.0–52.0)
Hemoglobin: 10.3 g/dL — ABNORMAL LOW (ref 13.0–17.0)
Lymphocytes Relative: 13.1 % (ref 12.0–46.0)
Lymphs Abs: 1.2 10*3/uL (ref 0.7–4.0)
MCHC: 33.4 g/dL (ref 30.0–36.0)
MCV: 89.6 fl (ref 78.0–100.0)
Monocytes Absolute: 1 10*3/uL (ref 0.1–1.0)
Monocytes Relative: 10.4 % (ref 3.0–12.0)
Neutro Abs: 6.9 10*3/uL (ref 1.4–7.7)
Neutrophils Relative %: 73.2 % (ref 43.0–77.0)
Platelets: 197 10*3/uL (ref 150.0–400.0)
RBC: 3.43 Mil/uL — ABNORMAL LOW (ref 4.22–5.81)
RDW: 18 % — ABNORMAL HIGH (ref 11.5–15.5)
WBC: 9.4 10*3/uL (ref 4.0–10.5)

## 2019-05-28 LAB — FERRITIN: Ferritin: 36.6 ng/mL (ref 22.0–322.0)

## 2019-05-28 MED ORDER — PANTOPRAZOLE SODIUM 40 MG PO TBEC: 40.0000 mg | DELAYED_RELEASE_TABLET | Freq: Two times a day (BID) | ORAL | 3 refills | Status: AC

## 2019-05-28 NOTE — Progress Notes (Signed)
FYI Had CBC also

## 2019-05-28 NOTE — Patient Instructions (Addendum)
Continue Protonix 40 mg twice daily, 30 minutes before breakfast and dinner.  Please send prescription with refills for a year - I have sent this electronically to your pharmacy.  Avoid NSAIDs  Continue oral iron  Follow-up in 6 months - Please call the office in about 4 months to schedule this follow up for the end of November.

## 2019-05-28 NOTE — Progress Notes (Signed)
John [name]    [id number]    1946-05-13  Primary Care Physician:Kelly-Coleman, Brayton Layman, NP  Referring Physician: [name], NP [place], [place] [address]  This service was provided via audio only telemedicine (Telephone) due to [place] 19 pandemic.  Patient location: Home Provider location: Office Used 2 patient identifiers to confirm the correct person. Explained the limitations in evaluation and management via telemedicine. Patient is aware of potential medical charges for this visit.  Patient consented to this virtual visit.  The persons participating in this telemedicine service were myself , his daughter and the patient    Chief complaint:  Duodenal ulcer HPI: 73 year old male with history of COPD, CHF, diabetes, lung cancer status post patient with recurrent mass lesion in the right lung and a recent hospitalization GI hemorrhage in the setting of large duodenal ulcer.  He underwent prophylactic GDA embolization.  Completed H. pylori therapy. Denies taking any NSAIDs. Repeat hemoglobin improved to 10. He is doing well overall. Denies any nausea, vomiting, abdominal pain, melena or bright red blood per rectum Is planning to undergo PET scan and therapy for likely recurrent lung cancer, being managed by Dr. Julien Nordmann at [facility].    Outpatient Encounter Medications as of 05/28/2019  Medication Sig  . albuterol (PROAIR HFA) 108 (90 BASE) MCG/ACT inhaler INHALE 2 PUFFS BY MOUTH EVERY 4 HOURS AS NEEDED FOR WHEEZING (Patient taking differently: Inhale 2 puffs into the lungs every 4 (four) hours as needed for wheezing. )  . allopurinol (ZYLOPRIM) 300 MG tablet Take 300 mg by mouth daily.  Marland Kitchen atorvastatin (LIPITOR) 20 MG tablet Take 1 tablet (20 mg total) by mouth daily.  . bisoprolol (ZEBETA) 5 MG tablet Take 1 tablet (5 mg total) by mouth daily.  . budesonide-formoterol (SYMBICORT) 160-4.5 MCG/ACT inhaler INHALE 2 PUFFS BY MOUTH EVERY  12 HOURS (FIRST THING IN THE MORNING THEN 12 HOURS LATER) (Patient taking differently: Inhale 2 puffs into the lungs 2 (two) times daily as needed (SOB). )  . ferrous sulfate 325 (65 FE) MG tablet Take 1 tablet (325 mg total) by mouth every Monday, Wednesday, and Friday.  . folic acid (FOLVITE) 1 MG tablet Take 1 tablet (1 mg total) by mouth daily.  . furosemide (LASIX) 20 MG tablet Take 1 tablet (20 mg total) by mouth every Monday, Wednesday, and Friday.  . metFORMIN (GLUCOPHAGE) 1000 MG tablet Take 0.5 tablets (500 mg total) by mouth 2 (two) times daily.  . pantoprazole (PROTONIX) 40 MG tablet Take 1 tablet (40 mg total) by mouth 2 (two) times daily before a meal.  . senna-docusate (SENOKOT-S) 8.6-50 MG tablet Take 1 tablet by mouth at bedtime.  . sucralfate (CARAFATE) 1 g tablet Take 1 tablet (1 g total) by mouth 4 (four) times daily -  with meals and at bedtime for 30 days.  . Vitamin D, Ergocalciferol, (DRISDOL) 1.25 MG (50000 UT) CAPS capsule Take 1 capsule by mouth every Friday.    No facility-administered encounter medications on file as of 05/28/2019.     Allergies as of 05/28/2019 - Review Complete 05/27/2019  Allergen Reaction Noted  . Levaquin [levofloxacin in d5w]  12/06/2012  . Lisinopril Swelling 02/22/2012    Past Medical History:  Diagnosis Date  . B12 deficiency   . CHF (congestive heart failure) ([place])   . COPD (chronic obstructive pulmonary disease) ([place])   . Diabetes mellitus without complication ([place])   . Folate deficiency   .  Gout   . Hyperlipemia   . Hypertension   . Lung cancer ([place])    squamous cell carcinoma of right lower lobe lung  . MI (myocardial infarction) ([place])   . Pulmonary hypertension ([place])     Past Surgical History:  Procedure Laterality Date  . APPENDECTOMY    . BIOPSY  04/21/2019   Procedure: BIOPSY;  Surgeon: Gatha Mayer, MD;  Location: [facility];  Service: Endoscopy;;  . ESOPHAGOGASTRODUODENOSCOPY (EGD) WITH PROPOFOL N/A 04/21/2019    Procedure: ESOPHAGOGASTRODUODENOSCOPY (EGD) WITH PROPOFOL;  Surgeon: Gatha Mayer, MD;  Location: [facility];  Service: Endoscopy;  Laterality: N/A;  . ESOPHAGOGASTRODUODENOSCOPY (EGD) WITH PROPOFOL N/A 04/29/2019   Procedure: ESOPHAGOGASTRODUODENOSCOPY (EGD) WITH PROPOFOL;  Surgeon: Mauri Pole, MD;  Location: [facility];  Service: Endoscopy;  Laterality: N/A;  . HEMOSTASIS CLIP PLACEMENT  04/29/2019   Procedure: HEMOSTASIS CLIP PLACEMENT;  Surgeon: Mauri Pole, MD;  Location: [facility];  Service: Endoscopy;;  Clip placed as marker not for bleeding control  . IR ANGIOGRAM SELECTIVE EACH ADDITIONAL VESSEL  04/29/2019  . IR ANGIOGRAM SELECTIVE EACH ADDITIONAL VESSEL  04/29/2019  . IR ANGIOGRAM SELECTIVE EACH ADDITIONAL VESSEL  04/29/2019  . IR ANGIOGRAM VISCERAL SELECTIVE  04/29/2019  . IR EMBO ART  VEN HEMORR LYMPH EXTRAV  INC GUIDE ROADMAPPING  04/29/2019  . IR US GUIDE VASC ACCESS RIGHT  04/29/2019  . LUNG BIOPSY      Family History  Problem Relation Age of Onset  . Heart disease Mother   . Cancer Neg Hx     Social History   Socioeconomic History  . Marital status: Legally Separated    Spouse name: Not on file  . Number of children: 2  . Years of education: Not on file  . Highest education level: Not on file  Occupational History  . Occupation: retired  Scientific laboratory technician  . Financial resource strain: Not on file  . Food insecurity:    Worry: Not on file    Inability: Not on file  . Transportation needs:    Medical: Not on file    Non-medical: Not on file  Tobacco Use  . Smoking status: Former Smoker    Packs/day: 1.50    Years: 60.00    Pack years: 90.00    Types: Cigarettes    Last attempt to quit: 09/29/2012    Years since quitting: 6.6  . Smokeless tobacco: Never Used  Substance and Sexual Activity  . Alcohol use: No    Alcohol/week: 0.0 standard drinks  . Drug use: No  . Sexual activity: Yes  Lifestyle  . Physical activity:    Days per week:  Not on file    Minutes per session: Not on file  . Stress: Not on file  Relationships  . Social connections:    Talks on phone: Not on file    Gets together: Not on file    Attends religious service: Not on file    Active member of club or organization: Not on file    Attends meetings of clubs or organizations: Not on file    Relationship status: Not on file  . Intimate partner violence:    Fear of current or ex partner: Not on file    Emotionally abused: Not on file    Physically abused: Not on file    Forced sexual activity: Not on file  Other Topics Concern  . Not on file  Social History Narrative   Separated - 1 daughter  and 1 son   Retired Horticulturist, commercial   Former smoker, no EtOH      Review of systems: Review of Systems as per HPI All other systems reviewed and are negative.   Physical Exam: Vitals were not taken and physical exam was not performed during this virtual visit.  Data Reviewed:  Reviewed labs, radiology imaging, old records and pertinent past GI work up   Assessment and Plan/Recommendations:  73 year old male with COPD, CHF, diabetes, likely recurrent lung cancer, large duodenal ulcer secondary to NSAIDs and H. pylori with recurrent GI hemorrhage status post prophylactic embolization of GDA. Continue Protonix twice daily for additional 2 months, will then consider to decrease to once daily Continue oral iron Avoid NSAIDs We will plan for repeat EGD in 6 months to document healing of ulcer and obtain repeat biopsies. Follow-up in 6 months    K. Denzil Magnuson , MD   CC: Kelly-Coleman, [place], *

## 2019-05-28 NOTE — Telephone Encounter (Signed)
Dr. Silverio Decamp was unable to get in touch with patient for phone visit today at 10:00am.  Patient's daughter Willette Cluster called asking about his appointment - they were not clear that this was not an in office visit.  She stated he would be available to be called after 4:00pm .  Dr. Silverio Decamp agreed to do so.

## 2019-05-30 ENCOUNTER — Encounter: Payer: Self-pay | Admitting: Gastroenterology

## 2019-05-31 ENCOUNTER — Encounter: Payer: Self-pay | Admitting: Internal Medicine

## 2019-05-31 ENCOUNTER — Inpatient Hospital Stay: Payer: Medicare Other | Attending: Internal Medicine | Admitting: Internal Medicine

## 2019-05-31 ENCOUNTER — Other Ambulatory Visit: Payer: Self-pay

## 2019-05-31 VITALS — BP 136/60 | HR 74 | Temp 98.2°F | Resp 18 | Ht 64.0 in | Wt 193.6 lb

## 2019-05-31 DIAGNOSIS — Z791 Long term (current) use of non-steroidal anti-inflammatories (NSAID): Secondary | ICD-10-CM | POA: Insufficient documentation

## 2019-05-31 DIAGNOSIS — I10 Essential (primary) hypertension: Secondary | ICD-10-CM | POA: Diagnosis not present

## 2019-05-31 DIAGNOSIS — Z7951 Long term (current) use of inhaled steroids: Secondary | ICD-10-CM | POA: Insufficient documentation

## 2019-05-31 DIAGNOSIS — C3431 Malignant neoplasm of lower lobe, right bronchus or lung: Secondary | ICD-10-CM | POA: Diagnosis not present

## 2019-05-31 DIAGNOSIS — Z923 Personal history of irradiation: Secondary | ICD-10-CM | POA: Diagnosis not present

## 2019-05-31 DIAGNOSIS — Z7984 Long term (current) use of oral hypoglycemic drugs: Secondary | ICD-10-CM | POA: Insufficient documentation

## 2019-05-31 DIAGNOSIS — E785 Hyperlipidemia, unspecified: Secondary | ICD-10-CM | POA: Diagnosis not present

## 2019-05-31 DIAGNOSIS — E119 Type 2 diabetes mellitus without complications: Secondary | ICD-10-CM | POA: Insufficient documentation

## 2019-05-31 DIAGNOSIS — J449 Chronic obstructive pulmonary disease, unspecified: Secondary | ICD-10-CM | POA: Diagnosis not present

## 2019-05-31 DIAGNOSIS — C3490 Malignant neoplasm of unspecified part of unspecified bronchus or lung: Secondary | ICD-10-CM

## 2019-05-31 DIAGNOSIS — Z79899 Other long term (current) drug therapy: Secondary | ICD-10-CM | POA: Diagnosis not present

## 2019-05-31 DIAGNOSIS — I252 Old myocardial infarction: Secondary | ICD-10-CM | POA: Insufficient documentation

## 2019-05-31 NOTE — Progress Notes (Signed)
[place] [phone]   [phone]  OFFICE PROGRESS NOTE  [name], NP [place] [address]  DIAGNOSIS: Recurrent non-small cell lung cancer initially diagnosed as stage IA (T1b, N0, M0) non-small cell lung cancer, squamous cell carcinoma diagnosed in June 2016.  The patient has disease recurrence in May 2020.  PRIOR THERAPY: Course of curative radiotherapy to the right lower lobe lung nodule as well as mediastinal lymphadenopathy under the care of Dr. Tammi Klippel.  CURRENT THERAPY: Observation.  INTERVAL HISTORY: John [name] 73 y.o. male returns to the clinic today for follow-up visit.  The patient is feeling fine today with no concerning complaints except for mild fatigue.  He denied having any chest pain, shortness of breath, cough or hemoptysis.  He denied having any fever or chills.  He has no nausea, vomiting, diarrhea or constipation.  He denied having any weight loss or night sweats.  He denied having any headache or visual changes.  He was lost to follow-up for more than 3 years and recently found on CT scan of the chest to have enlarging right lung mass.  I ordered a PET scan which was performed recently and the patient is here today for evaluation and discussion of his PET scan results and recommendation regarding his condition.   MEDICAL HISTORY: Past Medical History:  Diagnosis Date  . B12 deficiency   . CHF (congestive heart failure) ([facility])   . COPD (chronic obstructive pulmonary disease) ([facility])   . Diabetes mellitus without complication ([place])   . Folate deficiency   . Gout   . Hyperlipemia   . Hypertension   . Lung cancer ([place])    squamous cell carcinoma of right lower lobe lung  . MI (myocardial infarction) ([facility])   . Pulmonary hypertension ([facility])     ALLERGIES:  is allergic to levaquin [levofloxacin in d5w] and lisinopril.  MEDICATIONS:  Current Outpatient Medications  Medication Sig  Dispense Refill  . albuterol (PROAIR HFA) 108 (90 BASE) MCG/ACT inhaler INHALE 2 PUFFS BY MOUTH EVERY 4 HOURS AS NEEDED FOR WHEEZING (Patient taking differently: Inhale 2 puffs into the lungs every 4 (four) hours as needed for wheezing. ) 1 Inhaler 11  . allopurinol (ZYLOPRIM) 300 MG tablet Take 300 mg by mouth daily.    Marland Kitchen amLODipine (NORVASC) 10 MG tablet Take 10 mg by mouth daily.    Marland Kitchen atorvastatin (LIPITOR) 20 MG tablet Take 1 tablet (20 mg total) by mouth daily. 30 tablet 0  . bisoprolol (ZEBETA) 5 MG tablet Take 1 tablet (5 mg total) by mouth daily. 30 tablet 0  . BREO ELLIPTA 100-25 MCG/INH AEPB     . budesonide-formoterol (SYMBICORT) 160-4.5 MCG/ACT inhaler INHALE 2 PUFFS BY MOUTH EVERY 12 HOURS (FIRST THING IN THE MORNING THEN 12 HOURS LATER) (Patient taking differently: Inhale 2 puffs into the lungs 2 (two) times daily as needed (SOB). ) 1 Inhaler 0  . Cholecalciferol (VITAMIN D3) 50 MCG (2000 UT) capsule TAKE ONE TABLET BY MOUTH EVERY DAY    . cloNIDine (CATAPRES) 0.1 MG tablet Take 0.1 mg by mouth 2 (two) times daily.    . ferrous sulfate 325 (65 FE) MG tablet Take 1 tablet (325 mg total) by mouth every Monday, Wednesday, and Friday. 30 tablet 0  . folic acid (FOLVITE) 1 MG tablet Take 1 tablet (1 mg total) by mouth daily. 30 tablet 11  . furosemide (LASIX) 20 MG tablet Take 1 tablet (20  mg total) by mouth every Monday, Wednesday, and Friday. 30 tablet 0  . metFORMIN (GLUCOPHAGE) 1000 MG tablet Take 0.5 tablets (500 mg total) by mouth 2 (two) times daily. 60 tablet 0  . naproxen (NAPROSYN) 500 MG tablet take 1 tablet by oral route 2 times a day as needed for 30 days    . pantoprazole (PROTONIX) 40 MG tablet Take 1 tablet (40 mg total) by mouth 2 (two) times daily before a meal. 180 tablet 3  . senna-docusate (SENOKOT-S) 8.6-50 MG tablet Take 1 tablet by mouth at bedtime. 30 tablet 0  . spironolactone (ALDACTONE) 25 MG tablet Take 12.5 mg by mouth daily.    . sucralfate (CARAFATE) 1 g  tablet Take 1 tablet (1 g total) by mouth 4 (four) times daily -  with meals and at bedtime for 30 days. 120 tablet 0  . Vitamin D, Ergocalciferol, (DRISDOL) 1.25 MG (50000 UT) CAPS capsule Take 1 capsule by mouth every Friday.      No current facility-administered medications for this visit.     SURGICAL HISTORY:  Past Surgical History:  Procedure Laterality Date  . APPENDECTOMY    . BIOPSY  04/21/2019   Procedure: BIOPSY;  Surgeon: Gatha Mayer, MD;  Location: [facility];  Service: Endoscopy;;  . ESOPHAGOGASTRODUODENOSCOPY (EGD) WITH PROPOFOL N/A 04/21/2019   Procedure: ESOPHAGOGASTRODUODENOSCOPY (EGD) WITH PROPOFOL;  Surgeon: Gatha Mayer, MD;  Location: [facility];  Service: Endoscopy;  Laterality: N/A;  . ESOPHAGOGASTRODUODENOSCOPY (EGD) WITH PROPOFOL N/A 04/29/2019   Procedure: ESOPHAGOGASTRODUODENOSCOPY (EGD) WITH PROPOFOL;  Surgeon: Mauri Pole, MD;  Location: [facility];  Service: Endoscopy;  Laterality: N/A;  . HEMOSTASIS CLIP PLACEMENT  04/29/2019   Procedure: HEMOSTASIS CLIP PLACEMENT;  Surgeon: Mauri Pole, MD;  Location: [facility];  Service: Endoscopy;;  Clip placed as marker not for bleeding control  . IR ANGIOGRAM SELECTIVE EACH ADDITIONAL VESSEL  04/29/2019  . IR ANGIOGRAM SELECTIVE EACH ADDITIONAL VESSEL  04/29/2019  . IR ANGIOGRAM SELECTIVE EACH ADDITIONAL VESSEL  04/29/2019  . IR ANGIOGRAM VISCERAL SELECTIVE  04/29/2019  . IR EMBO ART  VEN HEMORR LYMPH EXTRAV  INC GUIDE ROADMAPPING  04/29/2019  . IR US GUIDE VASC ACCESS RIGHT  04/29/2019  . LUNG BIOPSY      REVIEW OF SYSTEMS:  Constitutional: positive for fatigue Eyes: negative Ears, nose, mouth, throat, and face: negative Respiratory: negative Cardiovascular: negative Gastrointestinal: negative Genitourinary:negative Integument/breast: negative Hematologic/lymphatic: negative Musculoskeletal:negative Neurological: negative Behavioral/Psych: negative Endocrine:  negative Allergic/Immunologic: negative   PHYSICAL EXAMINATION: General appearance: alert, cooperative, fatigued and no distress Head: Normocephalic, without obvious abnormality, atraumatic Neck: no adenopathy, no JVD, supple, symmetrical, trachea midline and thyroid not enlarged, symmetric, no tenderness/mass/nodules Lymph nodes: Cervical, supraclavicular, and axillary nodes normal. Resp: clear to auscultation bilaterally Back: symmetric, no curvature. ROM normal. No CVA tenderness. Cardio: regular rate and rhythm, S1, S2 normal, no murmur, click, rub or gallop GI: soft, non-tender; bowel sounds normal; no masses,  no organomegaly Extremities: extremities normal, atraumatic, no cyanosis or edema Neurologic: Alert and oriented X 3, normal strength and tone. Normal symmetric reflexes. Normal coordination and gait  ECOG PERFORMANCE STATUS: 1 - Symptomatic but completely ambulatory  Blood pressure 136/60, pulse 74, temperature 98.2 F (36.8 C), temperature source Oral, resp. rate 18, height 5\' 4"  (1.626 m), weight 193 lb 9.6 oz (87.8 kg), SpO2 100 %.  LABORATORY DATA: Lab Results  Component Value Date   WBC 9.4 05/28/2019   HGB 10.3 (L) 05/28/2019   HCT 30.7 (  L) 05/28/2019   MCV 89.6 05/28/2019   PLT 197.0 05/28/2019      Chemistry      Component Value Date/Time   NA 139 05/01/2019 0235   NA 143 10/04/2016 1354   K 3.6 05/01/2019 0235   K 3.9 10/04/2016 1354   CL 108 05/01/2019 0235   CO2 24 05/01/2019 0235   CO2 25 10/04/2016 1354   BUN 16 05/01/2019 0235   BUN 11.6 10/04/2016 1354   CREATININE 1.18 05/01/2019 0235   CREATININE 1.3 10/04/2016 1354      Component Value Date/Time   CALCIUM 8.5 (L) 05/01/2019 0235   CALCIUM 9.5 10/04/2016 1354   ALKPHOS 70 04/29/2019 0014   ALKPHOS 119 10/04/2016 1354   AST 14 (L) 04/29/2019 0014   AST 16 10/04/2016 1354   ALT 12 04/29/2019 0014   ALT 14 10/04/2016 1354   BILITOT 1.2 04/29/2019 0014   BILITOT 0.53 10/04/2016 1354        RADIOGRAPHIC STUDIES: Nm Pet Image Restag (ps) Skull Base To Thigh  Result Date: 05/27/2019 CLINICAL DATA:  Subsequent treatment strategy for lung cancer. EXAM: NUCLEAR MEDICINE PET SKULL BASE TO THIGH TECHNIQUE: 8.76 mCi F-18 FDG was injected intravenously. Full-ring PET imaging was performed from the skull base to thigh after the radiotracer. CT data was obtained and used for attenuation correction and anatomic localization. Fasting blood glucose: 139 mg/dl COMPARISON:  04/13/2015 FINDINGS: Mediastinal blood pool activity: SUV max 1.98 Liver activity: SUV max NA NECK: No hypermetabolic lymph nodes in the neck. Incidental CT findings: none CHEST: No hypermetabolic axillary or supraclavicular lymph nodes. No hypermetabolic mediastinal or hilar lymph nodes. - in the posterior right lower lobe there is a solid lung mass which measures 5.3 cm and has an SUV max of 18.3, image 45/8. On study from 10/04/2016 there was a cystic and solid lesion in this area. The solid nodular component measured 1 cm. -The previously treated right lower lobe lung nodule with surrounding changes of external beam radiation measures the 2 cm and has an SUV max of 2.45. Incidental CT findings: Emphysema. Aortic atherosclerosis. Calcification in the RCA, LAD and left circumflex coronary arteries. ABDOMEN/PELVIS: No abnormal radiotracer uptake identified within the liver, pancreas, spleen or adrenal glands. No hypermetabolic abdominopelvic adenopathy. Incidental CT findings: Aortic atherosclerosis. Right kidney cysts. Several stones within the upper pole of right kidney are noted measuring up to 6 mm. Gallstones. SKELETON: No focal hypermetabolic activity to suggest skeletal metastasis. Incidental CT findings: none IMPRESSION: 1. Large solid mass in the right lower lobe is identified measuring 5.3 cm within SUV max of 18.3. This appears to have arisen from previous cystic and solid lesion in the posterior right lower lobe. 2. Mild  nonspecific FDG uptake associated with previously treated right lower lobe lung nodule with surrounding radiation change. 3. Aortic Atherosclerosis (ICD10-I70.0) and Emphysema (ICD10-J43.9). 4. Coronary artery calcifications 5. Gallstones and kidney stones. Electronically Signed   By: Kerby Moors M.D.   On: 05/27/2019 08:30    ASSESSMENT AND PLAN: This is a very pleasant 73 years old African-American male with stage IA non-small cell lung cancer, squamous cell carcinoma diagnosed in June 2016 status post curative radiotherapy with partial response.  The patient has been on observation since that time. He was lost to follow-up since 2017. The patient presented with new findings on recent CT scan of the chest including annual right lower lobe lung mass as well as increasing size of the previous lesion in the right  lower lobe. I ordered a PET scan which was performed recently and showed hypermetabolic activity in the large solid mass in the right lower lobe. I personally and independently reviewed the scan images and discussed the results with the patient today. I recommended for him to have CT-guided core biopsy of the right lower lobe lung mass for confirmation of tissue diagnosis. I also referred the patient to Dr. Tammi Klippel for consideration of curative radiotherapy to the new right lower lobe lung mass. I will see him back for follow-up visit in 4 months for evaluation with repeat CT scan of the chest for restaging of his disease after the radiation. The patient was advised to call immediately if he has any other concerning symptoms in the interval. The patient voices understanding of current disease status and treatment options and is in agreement with the current care plan.  All questions were answered. The patient knows to call the clinic with any problems, questions or concerns. We can certainly see the patient much sooner if necessary.  Disclaimer: This note was dictated with voice  recognition software. Similar sounding words can inadvertently be transcribed and may not be corrected upon review.

## 2019-06-01 ENCOUNTER — Ambulatory Visit: Payer: Medicare Other | Admitting: Urology

## 2019-06-01 ENCOUNTER — Telehealth: Payer: Self-pay | Admitting: Internal Medicine

## 2019-06-01 ENCOUNTER — Ambulatory Visit: Payer: Medicare Other

## 2019-06-01 NOTE — Telephone Encounter (Signed)
Scheduled appt per 6/1 los - mail letter with appt date and time .

## 2019-06-02 ENCOUNTER — Telehealth (HOSPITAL_COMMUNITY): Payer: Self-pay

## 2019-06-02 ENCOUNTER — Encounter: Payer: Self-pay | Admitting: Radiation Oncology

## 2019-06-02 NOTE — Progress Notes (Signed)
Thoracic Location of Tumor / Histology: Recurrent non-small cell lung cancer initially diagnosed as stage IA (T1b, N0, M0) non-small cell lung cancer, squamous cell carcinoma diagnosed in June 2016.  The patient has disease recurrence in May 2020.  He was lost to follow-up for more than 3 years and recently found on CT scan of the chest to have enlarging right lung mass.   Biopsies of RLL(if applicable) revealed:   Tobacco/Marijuana/Snuff/ETOH use: Former smoker. Stopped 2013.  Past/Anticipated interventions by cardiothoracic surgery, if any: no  Past/Anticipated interventions by medical oncology, if any:  PRIOR THERAPY: Course of curative radiotherapy to the right lower lobe lung nodule as well as mediastinal lymphadenopathy under the care of Dr. Tammi Klippel.  CURRENT THERAPY: Observation.  Signs/Symptoms  Weight changes, if any: Denies  Respiratory complaints, if any: Denies cough or shortness of breath.  Hemoptysis, if any: Denies  Pain issues, if any:  Denies  SAFETY ISSUES:  Prior radiation? yes  Pacemaker/ICD? no  Possible current pregnancy?no, male patient  Is the patient on methotrexate? no  Current Complaints / other details:  73 year old male. Single with two children.

## 2019-06-04 ENCOUNTER — Ambulatory Visit
Admission: RE | Admit: 2019-06-04 | Discharge: 2019-06-04 | Disposition: A | Discharge status: Home / Self Care | Payer: Medicare Other | Source: Ambulatory Visit | Attending: Radiation Oncology | Admitting: Radiation Oncology

## 2019-06-04 ENCOUNTER — Other Ambulatory Visit: Payer: Self-pay

## 2019-06-04 ENCOUNTER — Encounter: Payer: Self-pay | Admitting: Radiation Oncology

## 2019-06-04 ENCOUNTER — Telehealth: Payer: Self-pay | Admitting: *Deleted

## 2019-06-04 VITALS — Ht 64.0 in | Wt 193.0 lb

## 2019-06-04 DIAGNOSIS — C3431 Malignant neoplasm of lower lobe, right bronchus or lung: Secondary | ICD-10-CM

## 2019-06-04 DIAGNOSIS — R918 Other nonspecific abnormal finding of lung field: Secondary | ICD-10-CM

## 2019-06-04 NOTE — Telephone Encounter (Signed)
Received call from pt's daughter, Jamey Ripa. She is asking who would be the person to remove staples from her father's head. Apparently he had a fall in May and has had the staples for 'awhile'.  Unclear as to when this fall occurred. Advised daughter to call pt's PCP regarding removing the staples in his head.   Lynnette voiced understanding.

## 2019-06-04 NOTE — Progress Notes (Signed)
See progress note under physician encounter. 

## 2019-06-04 NOTE — Progress Notes (Signed)
Radiation Oncology         [phone] ________________________________  Initial outpatient Consultation - Conducted via telephone due to current COVID-19 concerns for limiting patient exposure  Name: John [name] MRN: [id number]  Date: 06/04/2019  DOB: 1946-10-30  [id number], Brayton Layman, NP  Curt Bears, MD   REFERRING PHYSICIAN: Curt Bears, MD  DIAGNOSIS: 73 y.o. gentleman with new, enlarging RLL mass suspected secondary to recurrent non-small cell lung cancer.    ICD-10-CM   1. 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 Primary cancer of right lower lobe of lung (HCC) C34.31   3. Right lower lobe lung mass R91.8     HISTORY OF PRESENT ILLNESS: John [name] is a 73 y.o. male with a diagnosis of recurrent lung cancer. He was initially diagnosed in 05/2015 and treated with definitive radiation therapy. He was followed under observation by Dr. Julien Nordmann until 2017, when he was lost to follow up. He presented to the ED after losing consciousness on 04/08/2019. At that time, he underwent chest x-ray, which showed a new, indeterminate 5.4 cm right lower lobe mass. Brain MRI performed after the chest x-ray showed no intercranial metastatic disease. Several weeks later on 04/21/2019, he presented back to the ED for the same. He was found to have GI bleed and underwent endoscopy. He underwent further imaging for a duodenal ulcer following the endoscopy. The chest/abdomen/pelvis CT showed: right lower lobe mass, compatible with either recurrent or new bronchogenic carcinoma, inferior to the previously treated right lower lobe squamous cell carcinoma.   He returned to Dr. Julien Nordmann on 05/05/2019, who ordered a PET scan for further evaluation of the lung mass. Performed on 05/26/2019, the scan revealed: large solid mass in the right lower lobe is identified measuring 5.3 cm within SUV max of 18.3, this appears to have arisen from previous cystic and solid lesion in  the posterior right lower lobe; mild nonspecific FDG uptake associated with previously treated right lower lobe lung nodule with surrounding radiation change.  The patient will undergo biopsy next week. He reviewed the scan results with his oncologist and he has kindly been referred today for discussion of potential radiation treatment options.   PREVIOUS RADIATION THERAPY: Yes  06/26/2015 - 08/10/2015: Right Lower Lung + Involved Lymph Nodes / 66 Gy in 33 fractions  PAST MEDICAL HISTORY:  Past Medical History:  Diagnosis Date  . B12 deficiency   . CHF (congestive heart failure) ([place])   . COPD (chronic obstructive pulmonary disease) ([place])   . Diabetes mellitus without complication ([place])   . Folate deficiency   . Gout   . Hyperlipemia   . Hypertension   . Lung cancer ([place])    squamous cell carcinoma of right lower lobe lung  . MI (myocardial infarction) ([place])   . Pulmonary hypertension ([place])       PAST SURGICAL HISTORY: Past Surgical History:  Procedure Laterality Date  . APPENDECTOMY    . BIOPSY  04/21/2019   Procedure: BIOPSY;  Surgeon: Gatha Mayer, MD;  Location: [facility];  Service: Endoscopy;;  . ESOPHAGOGASTRODUODENOSCOPY (EGD) WITH PROPOFOL N/A 04/21/2019   Procedure: ESOPHAGOGASTRODUODENOSCOPY (EGD) WITH PROPOFOL;  Surgeon: Gatha Mayer, MD;  Location: [facility];  Service: Endoscopy;  Laterality: N/A;  . ESOPHAGOGASTRODUODENOSCOPY (EGD) WITH PROPOFOL N/A 04/29/2019   Procedure: ESOPHAGOGASTRODUODENOSCOPY (EGD) WITH PROPOFOL;  Surgeon: Mauri Pole, MD;  Location: [facility];  Service: Endoscopy;  Laterality: N/A;  . HEMOSTASIS CLIP PLACEMENT  04/29/2019   Procedure: HEMOSTASIS CLIP PLACEMENT;  Surgeon: Mauri Pole, MD;  Location: [facility];  Service: Endoscopy;;  Clip placed as marker not for bleeding control  . IR ANGIOGRAM SELECTIVE EACH ADDITIONAL VESSEL  04/29/2019  . IR ANGIOGRAM SELECTIVE EACH ADDITIONAL VESSEL  04/29/2019  . IR ANGIOGRAM  SELECTIVE EACH ADDITIONAL VESSEL  04/29/2019  . IR ANGIOGRAM VISCERAL SELECTIVE  04/29/2019  . IR EMBO ART  VEN HEMORR LYMPH EXTRAV  INC GUIDE ROADMAPPING  04/29/2019  . IR US GUIDE VASC ACCESS RIGHT  04/29/2019  . LUNG BIOPSY      FAMILY HISTORY:  Family History  Problem Relation Age of Onset  . Heart disease Mother   . Cancer Neg Hx     SOCIAL HISTORY:  Social History   Socioeconomic History  . Marital status: Legally Separated    Spouse name: Not on file  . Number of children: 2  . Years of education: Not on file  . Highest education level: Not on file  Occupational History  . Occupation: retired  Scientific laboratory technician  . Financial resource strain: Not on file  . Food insecurity:    Worry: Not on file    Inability: Not on file  . Transportation needs:    Medical: Not on file    Non-medical: Not on file  Tobacco Use  . Smoking status: Former Smoker    Packs/day: 1.50    Years: 60.00    Pack years: 90.00    Types: Cigarettes    Last attempt to quit: 09/29/2012    Years since quitting: 6.6  . Smokeless tobacco: Never Used  Substance and Sexual Activity  . Alcohol use: No    Alcohol/week: 0.0 standard drinks  . Drug use: No  . Sexual activity: Yes  Lifestyle  . Physical activity:    Days per week: Not on file    Minutes per session: Not on file  . Stress: Not on file  Relationships  . Social connections:    Talks on phone: Not on file    Gets together: Not on file    Attends religious service: Not on file    Active member of club or organization: Not on file    Attends meetings of clubs or organizations: Not on file    Relationship status: Not on file  . Intimate partner violence:    Fear of current or ex partner: Not on file    Emotionally abused: Not on file    Physically abused: Not on file    Forced sexual activity: Not on file  Other Topics Concern  . Not on file  Social History Narrative   Separated - 1 daughter and 1 son   Retired Horticulturist, commercial   Former  smoker, no EtOH    ALLERGIES: Levaquin [levofloxacin in d5w] and Lisinopril  MEDICATIONS:  Current Outpatient Medications  Medication Sig Dispense Refill  . albuterol (PROAIR HFA) 108 (90 BASE) MCG/ACT inhaler INHALE 2 PUFFS BY MOUTH EVERY 4 HOURS AS NEEDED FOR WHEEZING (Patient taking differently: Inhale 2 puffs into the lungs every 4 (four) hours as needed for wheezing. ) 1 Inhaler 11  . allopurinol (ZYLOPRIM) 300 MG tablet Take 300 mg by mouth daily.    Marland Kitchen amLODipine (NORVASC) 10 MG tablet Take 10 mg by mouth daily.    Marland Kitchen atorvastatin (LIPITOR) 20 MG tablet Take 1 tablet (20 mg total) by mouth daily. 30 tablet 0  . bisoprolol (ZEBETA) 5 MG tablet Take 1 tablet (5  mg total) by mouth daily. 30 tablet 0  . BREO ELLIPTA 100-25 MCG/INH AEPB     . budesonide-formoterol (SYMBICORT) 160-4.5 MCG/ACT inhaler INHALE 2 PUFFS BY MOUTH EVERY 12 HOURS (FIRST THING IN THE MORNING THEN 12 HOURS LATER) (Patient taking differently: Inhale 2 puffs into the lungs 2 (two) times daily as needed (SOB). ) 1 Inhaler 0  . Cholecalciferol (VITAMIN D3) 50 MCG (2000 UT) capsule TAKE ONE TABLET BY MOUTH EVERY DAY    . cloNIDine (CATAPRES) 0.1 MG tablet Take 0.1 mg by mouth 2 (two) times daily.    . ferrous sulfate 325 (65 FE) MG tablet Take 1 tablet (325 mg total) by mouth every Monday, Wednesday, and Friday. 30 tablet 0  . folic acid (FOLVITE) 1 MG tablet Take 1 tablet (1 mg total) by mouth daily. 30 tablet 11  . furosemide (LASIX) 20 MG tablet Take 1 tablet (20 mg total) by mouth every Monday, Wednesday, and Friday. 30 tablet 0  . metFORMIN (GLUCOPHAGE) 1000 MG tablet Take 0.5 tablets (500 mg total) by mouth 2 (two) times daily. 60 tablet 0  . naproxen (NAPROSYN) 500 MG tablet take 1 tablet by oral route 2 times a day as needed for 30 days    . pantoprazole (PROTONIX) 40 MG tablet Take 1 tablet (40 mg total) by mouth 2 (two) times daily before a meal. 180 tablet 3  . senna-docusate (SENOKOT-S) 8.6-50 MG tablet Take 1  tablet by mouth at bedtime. 30 tablet 0  . spironolactone (ALDACTONE) 25 MG tablet Take 12.5 mg by mouth daily.    . Vitamin D, Ergocalciferol, (DRISDOL) 1.25 MG (50000 UT) CAPS capsule Take 1 capsule by mouth every Friday.     . sucralfate (CARAFATE) 1 g tablet Take 1 tablet (1 g total) by mouth 4 (four) times daily -  with meals and at bedtime for 30 days. 120 tablet 0   No current facility-administered medications for this encounter.     REVIEW OF SYSTEMS:  On review of systems, the patient reports that he is doing well overall. He denies any chest pain, shortness of breath, cough, fevers, chills, night sweats, unintended weight changes. He denies any bowel disturbances, and denies abdominal pain, nausea or vomiting. He denies any new musculoskeletal or joint aches or pains. A complete review of systems is obtained and is otherwise negative.    PHYSICAL EXAM:  Wt Readings from Last 3 Encounters:  06/04/19 193 lb (87.5 kg)  05/31/19 193 lb 9.6 oz (87.8 kg)  05/28/19 196 lb (88.9 kg)   Temp Readings from Last 3 Encounters:  05/31/19 98.2 F (36.8 C) (Oral)  05/05/19 97.9 F (36.6 C) (Oral)  05/01/19 98 F (36.7 C) (Oral)   BP Readings from Last 3 Encounters:  05/31/19 136/60  05/05/19 127/79  05/01/19 117/76   Pulse Readings from Last 3 Encounters:  05/31/19 74  05/05/19 79  05/01/19 79   Pain Assessment Pain Score: 0-No pain/10  In general this is a nice gentleman in no acute distress. He's alert and oriented x4 and appropriate throughout the examination. Cardiopulmonary assessment is negative for acute distress and he exhibits normal effort.    KPS = 100  100 - Normal; no complaints; no evidence of disease. 90   - Able to carry on normal activity; minor signs or symptoms of disease. 80   - Normal activity with effort; some signs or symptoms of disease. 8   - Cares for self; unable to carry on normal activity  or to do active work. 60   - Requires occasional  assistance, but is able to care for most of his personal needs. 50   - Requires considerable assistance and frequent medical care. 6   - Disabled; requires special care and assistance. 28   - Severely disabled; hospital admission is indicated although death not imminent. 62   - Very sick; hospital admission necessary; active supportive treatment necessary. 10   - Moribund; fatal processes progressing rapidly. 0     - Dead  Karnofsky DA, Abelmann [place], Craver LS and Burchenal [facility] [phone]) The use of the nitrogen mustards in the palliative treatment of carcinoma: with particular reference to bronchogenic carcinoma Cancer 1 634-56  LABORATORY DATA:  Lab Results  Component Value Date   WBC 9.4 05/28/2019   HGB 10.3 (L) 05/28/2019   HCT 30.7 (L) 05/28/2019   MCV 89.6 05/28/2019   PLT 197.0 05/28/2019   Lab Results  Component Value Date   NA 139 05/01/2019   K 3.6 05/01/2019   CL 108 05/01/2019   CO2 24 05/01/2019   Lab Results  Component Value Date   ALT 12 04/29/2019   AST 14 (L) 04/29/2019   ALKPHOS 70 04/29/2019   BILITOT 1.2 04/29/2019     RADIOGRAPHY: Nm Pet Image Restag (ps) Skull Base To Thigh  Result Date: 05/27/2019 CLINICAL DATA:  Subsequent treatment strategy for lung cancer. EXAM: NUCLEAR MEDICINE PET SKULL BASE TO THIGH TECHNIQUE: 8.76 mCi F-18 FDG was injected intravenously. Full-ring PET imaging was performed from the skull base to thigh after the radiotracer. CT data was obtained and used for attenuation correction and anatomic localization. Fasting blood glucose: 139 mg/dl COMPARISON:  04/13/2015 FINDINGS: Mediastinal blood pool activity: SUV max 1.98 Liver activity: SUV max NA NECK: No hypermetabolic lymph nodes in the neck. Incidental CT findings: none CHEST: No hypermetabolic axillary or supraclavicular lymph nodes. No hypermetabolic mediastinal or hilar lymph nodes. - in the posterior right lower lobe there is a solid lung mass which measures 5.3 cm and has an SUV max  of 18.3, image 45/8. On study from 10/04/2016 there was a cystic and solid lesion in this area. The solid nodular component measured 1 cm. -The previously treated right lower lobe lung nodule with surrounding changes of external beam radiation measures the 2 cm and has an SUV max of 2.45. Incidental CT findings: Emphysema. Aortic atherosclerosis. Calcification in the RCA, LAD and left circumflex coronary arteries. ABDOMEN/PELVIS: No abnormal radiotracer uptake identified within the liver, pancreas, spleen or adrenal glands. No hypermetabolic abdominopelvic adenopathy. Incidental CT findings: Aortic atherosclerosis. Right kidney cysts. Several stones within the upper pole of right kidney are noted measuring up to 6 mm. Gallstones. SKELETON: No focal hypermetabolic activity to suggest skeletal metastasis. Incidental CT findings: none IMPRESSION: 1. Large solid mass in the right lower lobe is identified measuring 5.3 cm within SUV max of 18.3. This appears to have arisen from previous cystic and solid lesion in the posterior right lower lobe. 2. Mild nonspecific FDG uptake associated with previously treated right lower lobe lung nodule with surrounding radiation change. 3. Aortic Atherosclerosis (ICD10-I70.0) and Emphysema (ICD10-J43.9). 4. Coronary artery calcifications 5. Gallstones and kidney stones. Electronically Signed   By: Kerby Moors M.D.   On: 05/27/2019 08:30      IMPRESSION/PLAN: 1. 73 y.o. gentleman with new, enlarging RLL mass suspected secondary to recurrent non-small cell lung cancer. This visit was conducted via telephone to spare the patient unnecessary potential exposure in the  healthcare setting during the current COVID-19 pandemic. We discussed the patient's workup and outlined the nature of lung cancer in this setting. We reviewed his recent PET scan and work up thus far. We briefly discussed the available radiation techniques, and focused on the details and logistics and delivery with  the understanding that a biopsy for tissue confirmation is necessary to help further guide our formal treatment recommendations.  He is scheduled for CT-guided biopsy of the right lower lobe lung mass on 06/10/2019.  Once we have those results we will be able to discuss a more formal treatment plan but anticipate offering a 2-week course of SBRT to the RLL mass if proven to be a NSCLC versus a more conventional course of radiation.  He and his daughter were encouraged to ask questions that were answered to their stated satisfaction.  At the conclusion of our conversation, the patient elects to proceed with CT-guided biopsy of the lung mass as scheduled on 06/10/2019.  We will tentatively schedule CT simulation on 06/18/2019 at 11 am and will discuss our formal treatment recommendations in greater detail and sign formal written consent to proceed at that time.  He appears to have a good understanding of his overall disease and our recommendations and is comfortable and in agreement with the stated plan.  He and his daughter are encouraged to call at anytime in the interim with any questions or concerns.  Given current concerns for patient exposure during the COVID-19 pandemic, this encounter was conducted via telephone. The patient was notified in advance and was offered a [place] meeting to allow for face to face communication but unfortunately reported that he did not have the appropriate resources/technology to support such a visit and instead preferred to proceed with telephone consult. The patient has given verbal consent for this type of encounter. The time spent during this encounter was 30 minutes. The attendants for this meeting include Tyler Pita MD, Ashlyn Bruning PA-C, [place], patient John [name] and daughter John [name]. During the encounter, Tyler Pita MD, Ashlyn Bruning PA-C, and scribe, Wilburn Mylar were located at [facility].   Patient John [name] and daughter John [name] were located at home.    Nicholos Johns, PA-C    Tyler Pita, MD  [facility] Direct Dial: [phone]  Fax: [phone] [email or web address]  Skype  [email or web address]   This document serves as a record of services personally performed by Tyler Pita, MD and Freeman Caldron, PA-C. It was created on their behalf by Wilburn Mylar, a trained medical scribe. The creation of this record is based on the scribe's personal observations and the provider's statements to them. This document has been checked and approved by the attending provider.

## 2019-06-07 ENCOUNTER — Inpatient Hospital Stay (HOSPITAL_COMMUNITY): Admission: RE | Admit: 2019-06-07 | Payer: Medicare Other | Source: Ambulatory Visit

## 2019-06-09 ENCOUNTER — Other Ambulatory Visit: Payer: Self-pay | Admitting: Student

## 2019-06-09 ENCOUNTER — Other Ambulatory Visit: Payer: Self-pay | Admitting: Radiology

## 2019-06-10 ENCOUNTER — Ambulatory Visit (HOSPITAL_COMMUNITY): Admission: RE | Admit: 2019-06-10 | Payer: Medicare Other | Source: Ambulatory Visit

## 2019-06-10 ENCOUNTER — Encounter (HOSPITAL_COMMUNITY): Payer: Self-pay

## 2019-06-14 ENCOUNTER — Telehealth: Payer: Self-pay | Admitting: *Deleted

## 2019-06-14 NOTE — Telephone Encounter (Signed)
Received call from John [name] in [facility] Scheduling. She states that pt did not show up for his Covid 19 testing or his CT biopsy on 6/8 and 6/11 respevtively.  She would like to reschedule but is asking if we had heard from pt about why he did not show up.  John [name] spoke with pt's daughter, John [name] at length about above appts. Advised that we had not heard from patient recently. TCT daughter, John [name]. LVM message for her to return call about rescheduling these appts.

## 2019-06-15 ENCOUNTER — Telehealth: Payer: Medicare Other | Admitting: Neurology

## 2019-06-15 ENCOUNTER — Other Ambulatory Visit: Payer: Self-pay

## 2019-06-17 ENCOUNTER — Telehealth: Payer: Self-pay | Admitting: *Deleted

## 2019-06-17 NOTE — Telephone Encounter (Signed)
Called patient's daughterWillette [name] to inform that her dad doesn't need to come tomorrow for sim, he needs to have his biopsy first and then sim can be scheduled, lvm for a return call

## 2019-06-18 ENCOUNTER — Ambulatory Visit: Payer: Medicare Other | Admitting: Radiation Oncology

## 2019-06-21 ENCOUNTER — Telehealth: Payer: Self-pay | Admitting: Radiation Oncology

## 2019-06-21 NOTE — Telephone Encounter (Signed)
Phoned patient's daughter, Willette Cluster. No answer. Left voicemail message with my direct number. Called to inquire about patient status and intentions on moving forward with radiation therapy. Will await return call.

## 2019-06-22 ENCOUNTER — Telehealth: Payer: Self-pay | Admitting: *Deleted

## 2019-06-22 NOTE — Telephone Encounter (Signed)
Oncology Nurse Navigator Documentation  Oncology Nurse Navigator Flowsheets 06/22/2019  Navigator Location [facility]  Navigator Encounter Type Telephone/I received a message from [facility] that they have been trying to get in touch with John [name].  I called but I was unable to reach as well. I did leave a vm message for him to call me with my name and phone number.   Telephone Outgoing Call  Treatment Phase Abnormal Scans  Barriers/Navigation Needs Education  Education Other  Interventions Education  Education Method Verbal  Acuity Level 2  Time Spent with Patient 15

## 2019-06-23 ENCOUNTER — Ambulatory Visit: Payer: Medicare Other | Admitting: Neurology

## 2019-06-23 ENCOUNTER — Telehealth: Payer: Self-pay | Admitting: *Deleted

## 2019-06-23 NOTE — Telephone Encounter (Signed)
Oncology Nurse Navigator Documentation  Oncology Nurse Navigator Flowsheets 06/23/2019  Navigator Location [facility]  Navigator Encounter Type Telephone/I called John [name] and was able to reach his daughter today.  She states she has been trying to call but I have not received any messages.  I updated her that central scheduling has been trying to schedule John [name]'s biopsy.  I told her that I can't schedule that the radiology team does that.  I updated her to expect a call from them.  I then called radiology and updated them that patient wants biopsy and please call when they can to get scheduled.  I was told that someone will call them tomorrow.  I will update Rad Onc and Dr. Julien Nordmann.   Telephone Outgoing Call  Treatment Phase Abnormal Scans  Barriers/Navigation Needs Coordination of Care;Education  Education Other  Interventions Coordination of Care;Education  Coordination of Care Other  Education Method Verbal  Acuity Level 2  Time Spent with Patient 30

## 2019-06-25 ENCOUNTER — Encounter: Payer: Self-pay | Admitting: *Deleted

## 2019-06-25 NOTE — Progress Notes (Signed)
Oncology Nurse Navigator Documentation  Oncology Nurse Navigator Flowsheets 06/25/2019  Navigator Location [facility]  Navigator Encounter Type Other/I followed up on John [name]'s appt for biopsy.  This has not been scheduled.  I called central scheduling and they will call the daughter today to schedule.  The scheduling team did ask me if I knew why they did not show for the biopsy.  I was told that everything was explained to them and COVID test was set up.  I told them I was unsure what happened and I think they were confused.    Telephone -  Treatment Phase Abnormal Scans  Barriers/Navigation Needs Coordination of Care  Education Other  Interventions Coordination of Care  Coordination of Care Other  Education Method -  Acuity Level 1  Time Spent with Patient 30

## 2019-06-30 ENCOUNTER — Telehealth: Payer: Self-pay | Admitting: *Deleted

## 2019-06-30 NOTE — Telephone Encounter (Signed)
Called patient's daughter to ask question, lvm for a return call

## 2019-07-01 ENCOUNTER — Telehealth: Payer: Self-pay | Admitting: *Deleted

## 2019-07-01 NOTE — Telephone Encounter (Signed)
Returned patient's phone call, spoke with patient's daughter, Willette Cluster and she agreed to sim appt. On 07-15-19 @ 9 am @ [facility]

## 2019-07-04 ENCOUNTER — Emergency Department (HOSPITAL_COMMUNITY)
Admission: EM | Admit: 2019-07-04 | Discharge: 2019-07-04 | Disposition: A | Discharge status: Home / Self Care | Payer: Medicare Other | Attending: Emergency Medicine | Admitting: Emergency Medicine

## 2019-07-04 ENCOUNTER — Emergency Department (HOSPITAL_COMMUNITY): Payer: Medicare Other

## 2019-07-04 ENCOUNTER — Other Ambulatory Visit: Payer: Self-pay

## 2019-07-04 ENCOUNTER — Encounter (HOSPITAL_COMMUNITY): Payer: Self-pay | Admitting: Emergency Medicine

## 2019-07-04 DIAGNOSIS — J449 Chronic obstructive pulmonary disease, unspecified: Secondary | ICD-10-CM | POA: Diagnosis not present

## 2019-07-04 DIAGNOSIS — N183 Chronic kidney disease, stage 3 (moderate): Secondary | ICD-10-CM | POA: Diagnosis not present

## 2019-07-04 DIAGNOSIS — E1122 Type 2 diabetes mellitus with diabetic chronic kidney disease: Secondary | ICD-10-CM | POA: Insufficient documentation

## 2019-07-04 DIAGNOSIS — Z87891 Personal history of nicotine dependence: Secondary | ICD-10-CM | POA: Diagnosis not present

## 2019-07-04 DIAGNOSIS — W19XXXA Unspecified fall, initial encounter: Secondary | ICD-10-CM | POA: Diagnosis not present

## 2019-07-04 DIAGNOSIS — I5032 Chronic diastolic (congestive) heart failure: Secondary | ICD-10-CM | POA: Diagnosis not present

## 2019-07-04 DIAGNOSIS — R410 Disorientation, unspecified: Secondary | ICD-10-CM | POA: Insufficient documentation

## 2019-07-04 DIAGNOSIS — I130 Hypertensive heart and chronic kidney disease with heart failure and stage 1 through stage 4 chronic kidney disease, or unspecified chronic kidney disease: Secondary | ICD-10-CM | POA: Insufficient documentation

## 2019-07-04 DIAGNOSIS — Z79899 Other long term (current) drug therapy: Secondary | ICD-10-CM | POA: Diagnosis not present

## 2019-07-04 DIAGNOSIS — Z7984 Long term (current) use of oral hypoglycemic drugs: Secondary | ICD-10-CM | POA: Insufficient documentation

## 2019-07-04 DIAGNOSIS — Z85118 Personal history of other malignant neoplasm of bronchus and lung: Secondary | ICD-10-CM | POA: Diagnosis not present

## 2019-07-04 NOTE — ED Notes (Signed)
Daughter- lynette, [phone] for updates

## 2019-07-04 NOTE — ED Triage Notes (Signed)
Patient arrived with EMS from home found on the floor inside his bedroom by family (unwitnessed fall ) this evening  , CBG=93 , denies LOC , reports mild right side headache , alert but disoriented to time /place . History of dementia , C- collar applied by EMS .

## 2019-07-04 NOTE — ED Notes (Signed)
Patient transported to CT scan . 

## 2019-07-04 NOTE — ED Provider Notes (Signed)
[facility] EMERGENCY DEPARTMENT Provider Note   CSN: [id number] Arrival date & time: 07/04/19  1948    History   Chief Complaint Chief Complaint  Patient presents with   Fall    HPI John [name] is a 73 y.o. male.     HPI   73yo male with history below presents with concern for fall.  Daughter reports he was trying to get up and fell. Pt has hx of dementia, is not reliable historian. He reports he was cutting hedges and fell.  Family reports he hit his head. No LOC. He has otherwise been in normal state of health, no fevers, no vomiting, no diarrhea, behaving normally.      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[place])    Helicobacter pylori gastritis    Chronic duodenal ulcer with hemorrhage    Multiple gastric ulcers    Acute esophagitis    Gastrointestinal hemorrhage 04/20/2019   Vitamin B12 deficiency 04/20/2019   Folate deficiency 04/20/2019   Dehydration 04/20/2019   Hypotension 04/20/2019   Melena    Acute blood loss anemia    CKD (chronic kidney disease), stage III ([place]) 04/08/2019   Right lower lobe lung mass 04/08/2019   Syncope and collapse 04/08/2019   Symptomatic anemia 04/08/2019   Occult GI bleeding 04/08/2019   Hyperglycemia 04/08/2019   Leukocytosis 04/08/2019   Syncope 04/08/2019   Low hemoglobin    Dyspnea on exertion 08/25/2017   Bursitis of elbow 03/15/2016   Obesity (BMI 30-39.9) 12/03/2015   Primary cancer of right  lower lobe of lung ([place]) 06/14/2015   Solitary pulmonary nodule 03/13/2015   RVF (right ventricular failure) ([place]) 11/04/2014   COPD II/III with reversibility  12/19/2012   Chronic respiratory failure with hypoxia ([place]) 12/18/2012   Drug-induced hyperglycemia 12/08/2012   Allergic drug rash 12/06/2012   Pulmonary HTN ([place]) 03/18/2012   Abnormal echocardiogram 03/18/2012   LV dysfunction 03/18/2012   COPD exacerbation ([place]) 02/22/2012   Chronic diastolic CHF (congestive heart failure) ([place]) 02/22/2012   HTN (hypertension) 02/22/2012   CAD (coronary artery disease) 02/22/2012   Acute on chronic systolic heart failure ([place]) 02/22/2012    Past Surgical History:  Procedure Laterality Date   APPENDECTOMY     BIOPSY  04/21/2019   Procedure: BIOPSY;  Surgeon: Gatha Mayer, MD;  Location: [place];  Service: Endoscopy;;   ESOPHAGOGASTRODUODENOSCOPY (EGD) WITH PROPOFOL N/A 04/21/2019   Procedure: ESOPHAGOGASTRODUODENOSCOPY (EGD) WITH PROPOFOL;  Surgeon: Gatha Mayer, MD;  Location: [facility] ENDOSCOPY;  Service: Endoscopy;  Laterality: N/A;   ESOPHAGOGASTRODUODENOSCOPY (EGD) WITH PROPOFOL N/A 04/29/2019   Procedure: ESOPHAGOGASTRODUODENOSCOPY (EGD) WITH PROPOFOL;  Surgeon: Mauri Pole, MD;  Location: [facility] ENDOSCOPY;  Service: Endoscopy;  Laterality: N/A;   HEMOSTASIS CLIP PLACEMENT  04/29/2019   Procedure: HEMOSTASIS CLIP PLACEMENT;  Surgeon: Mauri Pole, MD;  Location: [facility] ENDOSCOPY;  Service: Endoscopy;;  Clip placed as marker not for bleeding control  IR ANGIOGRAM SELECTIVE EACH ADDITIONAL VESSEL  04/29/2019   IR ANGIOGRAM SELECTIVE EACH ADDITIONAL VESSEL  04/29/2019   IR ANGIOGRAM SELECTIVE EACH ADDITIONAL VESSEL  04/29/2019   IR ANGIOGRAM VISCERAL SELECTIVE  04/29/2019   IR EMBO ART  VEN HEMORR LYMPH EXTRAV  INC GUIDE ROADMAPPING  04/29/2019   IR US GUIDE VASC ACCESS RIGHT  04/29/2019   LUNG BIOPSY          Home Medications    Prior to Admission  medications   Medication Sig Start Date End Date Taking? Authorizing Provider  albuterol (PROAIR HFA) 108 (90 BASE) MCG/ACT inhaler INHALE 2 PUFFS BY MOUTH EVERY 4 HOURS AS NEEDED FOR WHEEZING Patient taking differently: Inhale 2 puffs into the lungs every 4 (four) hours as needed for wheezing.  03/13/15   Tanda Rockers, MD  allopurinol (ZYLOPRIM) 300 MG tablet Take 300 mg by mouth daily. 04/07/19   [provider]  amLODipine (NORVASC) 10 MG tablet Take 10 mg by mouth daily. 05/07/19   [provider]  atorvastatin (LIPITOR) 20 MG tablet Take 1 tablet (20 mg total) by mouth daily. 09/26/16   Tanda Rockers, MD  bisoprolol (ZEBETA) 5 MG tablet Take 1 tablet (5 mg total) by mouth daily. 09/26/16   Tanda Rockers, MD  BREO ELLIPTA 100-25 MCG/INH AEPB Inhale 1 puff into the lungs 2 (two) times a day.  05/07/19   [provider]  budesonide-formoterol (SYMBICORT) 160-4.5 MCG/ACT inhaler INHALE 2 PUFFS BY MOUTH EVERY 12 HOURS (FIRST THING IN THE MORNING THEN 12 HOURS LATER) Patient taking differently: Inhale 2 puffs into the lungs every 12 (twelve) hours.  03/24/17   Tanda Rockers, MD  Cholecalciferol (VITAMIN D3) 50 MCG (2000 UT) capsule TAKE ONE TABLET BY MOUTH EVERY DAY 05/07/19   [provider]  cloNIDine (CATAPRES) 0.1 MG tablet Take 0.1 mg by mouth 2 (two) times daily. 05/07/19   [provider]  ferrous sulfate 325 (65 FE) MG tablet Take 1 tablet (325 mg total) by mouth every Monday, Wednesday, and Friday. 05/03/19 08/01/19  Florencia Reasons, MD  folic acid (FOLVITE) 1 MG tablet Take 1 tablet (1 mg total) by mouth daily. 04/09/19 04/08/20  Donne Hazel, MD  furosemide (LASIX) 20 MG tablet Take 1 tablet (20 mg total) by mouth every Monday, Wednesday, and Friday. 05/03/19   Florencia Reasons, MD  metFORMIN (GLUCOPHAGE) 1000 MG tablet Take 0.5 tablets (500 mg total) by mouth 2 (two) times daily. 05/01/19   Florencia Reasons, MD  naproxen (NAPROSYN) 500 MG tablet Take 500 mg by mouth 2 (two)  times daily as needed (pain).  05/07/19   [provider]  pantoprazole (PROTONIX) 40 MG tablet Take 1 tablet (40 mg total) by mouth 2 (two) times daily before a meal. 05/28/19   Nandigam, Venia Minks, MD  senna-docusate (SENOKOT-S) 8.6-50 MG tablet Take 1 tablet by mouth at bedtime. 05/01/19   Florencia Reasons, MD  spironolactone (ALDACTONE) 25 MG tablet Take 12.5 mg by mouth daily. 05/07/19   [provider]  Vitamin D, Ergocalciferol, (DRISDOL) 1.25 MG (50000 UT) CAPS capsule Take 1 capsule by mouth every Friday.  03/10/19   [provider]    Family History Family History  Problem Relation Age of Onset   Heart disease Mother    Cancer Neg Hx     Social History Social History   Tobacco Use   Smoking status: Former Smoker    Packs/day: 1.50    Years: 60.00  Pack years: 90.00    Types: Cigarettes    Quit date: 09/29/2012    Years since quitting: 6.7   Smokeless tobacco: Never Used  Substance Use Topics   Alcohol use: No    Alcohol/week: 0.0 standard drinks   Drug use: No     Allergies   Levaquin [levofloxacin in d5w] and Lisinopril   Review of Systems Review of Systems  Unable to perform ROS: Dementia  Constitutional: Negative for fever.  Respiratory: Negative for cough.   Cardiovascular: Negative for chest pain.  Gastrointestinal: Negative for abdominal pain, nausea and vomiting.  Musculoskeletal: Negative for back pain and neck pain.  Skin: Negative for rash.  Neurological: Negative for syncope, weakness, numbness and headaches.     Physical Exam Updated Vital Signs BP (!) 133/44    Pulse 78    Temp 99.2 F (37.3 C) (Oral)    Resp 14    Ht 5\' 4"  (1.626 m)    Wt 87.5 kg    SpO2 96%    BMI 33.11 kg/m   Physical Exam Vitals signs and nursing note reviewed.  Constitutional:      General: He is not in acute distress.    Appearance: He is well-developed. He is not diaphoretic.  HENT:     Head: Normocephalic and atraumatic.  Eyes:      Conjunctiva/sclera: Conjunctivae normal.  Neck:     Musculoskeletal: Normal range of motion.  Cardiovascular:     Rate and Rhythm: Normal rate and regular rhythm.     Heart sounds: Normal heart sounds.  Pulmonary:     Effort: Pulmonary effort is normal. No respiratory distress.     Breath sounds: Normal breath sounds.  Chest:     Chest wall: No tenderness.  Abdominal:     General: There is no distension.     Palpations: Abdomen is soft.     Tenderness: There is no abdominal tenderness. There is no guarding.  Skin:    General: Skin is warm and dry.  Neurological:     Mental Status: He is alert.     GCS: GCS eye subscore is 4. GCS verbal subscore is 5. GCS motor subscore is 6.     Motor: Motor function is intact.     Comments: Oriented to self, location in ED      ED Treatments / Results  Labs (all labs ordered are listed, but only abnormal results are displayed) Labs Reviewed - No data to display  EKG None  Radiology Ct Head Wo Contrast  Result Date: 07/04/2019 CLINICAL DATA:  Patient found on the floor.  Disoriented. EXAM: CT HEAD WITHOUT CONTRAST CT CERVICAL SPINE WITHOUT CONTRAST TECHNIQUE: Multidetector CT imaging of the head and cervical spine was performed following the standard protocol without intravenous contrast. Multiplanar CT image reconstructions of the cervical spine were also generated. COMPARISON:  Brain and cervical spine CT 04/08/2019. FINDINGS: CT HEAD FINDINGS Brain: Ventricles and sulci are appropriate for patient's age. No evidence for acute cortically based infarct, intracranial hemorrhage, mass lesion or mass-effect. Periventricular and subcortical white matter hypodensities compatible with chronic microvascular ischemic changes. Vascular: Unremarkable Skull: Intact. Sinuses/Orbits: Mucosal thickening involving the paranasal sinuses. Mastoid air cells are unremarkable. Orbits are unremarkable. Other: None. CT CERVICAL SPINE FINDINGS Alignment: Straightening  of the normal cervical lordosis. Skull base and vertebrae: Intact. Soft tissues and spinal canal: No prevertebral fluid or swelling. No visible canal hematoma. Disc levels: Degenerative disc disease most pronounced C4-5, C5-6 and C6-7. Preservation of  the vertebral body heights. No acute fracture. Upper chest: Unremarkable Other: Bilateral carotid bifurcation calcified atherosclerotic plaque. IMPRESSION: No acute intracranial process. Atrophy and chronic microvascular ischemic changes. No acute cervical spine fracture. Multilevel degenerative disc disease. Electronically Signed   By: Lovey Newcomer M.D.   On: 07/04/2019 21:21   Ct Cervical Spine Wo Contrast  Result Date: 07/04/2019 CLINICAL DATA:  Patient found on the floor.  Disoriented. EXAM: CT HEAD WITHOUT CONTRAST CT CERVICAL SPINE WITHOUT CONTRAST TECHNIQUE: Multidetector CT imaging of the head and cervical spine was performed following the standard protocol without intravenous contrast. Multiplanar CT image reconstructions of the cervical spine were also generated. COMPARISON:  Brain and cervical spine CT 04/08/2019. FINDINGS: CT HEAD FINDINGS Brain: Ventricles and sulci are appropriate for patient's age. No evidence for acute cortically based infarct, intracranial hemorrhage, mass lesion or mass-effect. Periventricular and subcortical white matter hypodensities compatible with chronic microvascular ischemic changes. Vascular: Unremarkable Skull: Intact. Sinuses/Orbits: Mucosal thickening involving the paranasal sinuses. Mastoid air cells are unremarkable. Orbits are unremarkable. Other: None. CT CERVICAL SPINE FINDINGS Alignment: Straightening of the normal cervical lordosis. Skull base and vertebrae: Intact. Soft tissues and spinal canal: No prevertebral fluid or swelling. No visible canal hematoma. Disc levels: Degenerative disc disease most pronounced C4-5, C5-6 and C6-7. Preservation of the vertebral body heights. No acute fracture. Upper chest:  Unremarkable Other: Bilateral carotid bifurcation calcified atherosclerotic plaque. IMPRESSION: No acute intracranial process. Atrophy and chronic microvascular ischemic changes. No acute cervical spine fracture. Multilevel degenerative disc disease. Electronically Signed   By: Lovey Newcomer M.D.   On: 07/04/2019 21:21    Procedures Procedures (including critical care time)  Medications Ordered in ED Medications - No data to display   Initial Impression / Assessment and Plan / ED Course  I have reviewed the triage vital signs and the nursing notes.  Pertinent labs & imaging results that were available during my care of the patient were reviewed by me and considered in my medical decision making (see chart for details).        73yo male with hx above presents with concern for fall.  CT head and CSPine without abnormalities. Pt and family report no other concerns, no signs of other injuries. Family denies acute illness. Pt well appearing, normal neuro exam. Patient discharged in stable condition with understanding of reasons to return.   Final Clinical Impressions(s) / ED Diagnoses   Final diagnoses:  Fall, initial encounter    ED Discharge Orders    None       Gareth Morgan, MD 07/05/19 [phone]

## 2019-07-05 ENCOUNTER — Other Ambulatory Visit (HOSPITAL_COMMUNITY)
Admission: RE | Admit: 2019-07-05 | Discharge: 2019-07-05 | Disposition: A | Discharge status: Home / Self Care | Payer: Medicare Other | Source: Ambulatory Visit | Attending: Internal Medicine | Admitting: Internal Medicine

## 2019-07-05 DIAGNOSIS — Z01812 Encounter for preprocedural laboratory examination: Secondary | ICD-10-CM | POA: Insufficient documentation

## 2019-07-05 DIAGNOSIS — Z1159 Encounter for screening for other viral diseases: Secondary | ICD-10-CM | POA: Diagnosis not present

## 2019-07-06 ENCOUNTER — Telehealth: Payer: Medicare Other | Admitting: Neurology

## 2019-07-06 LAB — SARS CORONAVIRUS 2 (TAT 6-24 HRS): SARS Coronavirus 2: NEGATIVE

## 2019-07-07 ENCOUNTER — Other Ambulatory Visit: Payer: Self-pay | Admitting: Radiology

## 2019-07-07 ENCOUNTER — Other Ambulatory Visit: Payer: Self-pay | Admitting: Student

## 2019-07-08 ENCOUNTER — Emergency Department (HOSPITAL_COMMUNITY): Payer: Medicare Other

## 2019-07-08 ENCOUNTER — Other Ambulatory Visit: Payer: Self-pay

## 2019-07-08 ENCOUNTER — Ambulatory Visit (HOSPITAL_COMMUNITY)
Admission: RE | Admit: 2019-07-08 | Discharge: 2019-07-08 | Disposition: A | Discharge status: Home / Self Care | Payer: Medicare Other | Source: Ambulatory Visit | Attending: Internal Medicine | Admitting: Internal Medicine

## 2019-07-08 ENCOUNTER — Encounter (HOSPITAL_COMMUNITY): Payer: Self-pay | Admitting: Emergency Medicine

## 2019-07-08 ENCOUNTER — Inpatient Hospital Stay (HOSPITAL_COMMUNITY)
Admission: EM | Admit: 2019-07-08 | Discharge: 2019-07-16 | DRG: 682 | Disposition: A | Discharge status: Home Health | Payer: Medicare Other | Attending: Internal Medicine | Admitting: Internal Medicine

## 2019-07-08 ENCOUNTER — Inpatient Hospital Stay (HOSPITAL_COMMUNITY): Payer: Medicare Other

## 2019-07-08 DIAGNOSIS — I130 Hypertensive heart and chronic kidney disease with heart failure and stage 1 through stage 4 chronic kidney disease, or unspecified chronic kidney disease: Secondary | ICD-10-CM | POA: Diagnosis present

## 2019-07-08 DIAGNOSIS — N178 Other acute kidney failure: Secondary | ICD-10-CM

## 2019-07-08 DIAGNOSIS — Z79899 Other long term (current) drug therapy: Secondary | ICD-10-CM | POA: Diagnosis not present

## 2019-07-08 DIAGNOSIS — I252 Old myocardial infarction: Secondary | ICD-10-CM | POA: Diagnosis not present

## 2019-07-08 DIAGNOSIS — N183 Chronic kidney disease, stage 3 unspecified: Secondary | ICD-10-CM | POA: Diagnosis present

## 2019-07-08 DIAGNOSIS — I9589 Other hypotension: Secondary | ICD-10-CM | POA: Diagnosis not present

## 2019-07-08 DIAGNOSIS — Z7951 Long term (current) use of inhaled steroids: Secondary | ICD-10-CM | POA: Diagnosis not present

## 2019-07-08 DIAGNOSIS — R531 Weakness: Secondary | ICD-10-CM

## 2019-07-08 DIAGNOSIS — J9621 Acute and chronic respiratory failure with hypoxia: Secondary | ICD-10-CM | POA: Diagnosis present

## 2019-07-08 DIAGNOSIS — Z9981 Dependence on supplemental oxygen: Secondary | ICD-10-CM

## 2019-07-08 DIAGNOSIS — E785 Hyperlipidemia, unspecified: Secondary | ICD-10-CM | POA: Diagnosis present

## 2019-07-08 DIAGNOSIS — E870 Hyperosmolality and hypernatremia: Secondary | ICD-10-CM | POA: Diagnosis present

## 2019-07-08 DIAGNOSIS — N179 Acute kidney failure, unspecified: Principal | ICD-10-CM | POA: Diagnosis present

## 2019-07-08 DIAGNOSIS — D689 Coagulation defect, unspecified: Secondary | ICD-10-CM

## 2019-07-08 DIAGNOSIS — D72829 Elevated white blood cell count, unspecified: Secondary | ICD-10-CM

## 2019-07-08 DIAGNOSIS — I951 Orthostatic hypotension: Secondary | ICD-10-CM

## 2019-07-08 DIAGNOSIS — E876 Hypokalemia: Secondary | ICD-10-CM | POA: Diagnosis present

## 2019-07-08 DIAGNOSIS — C3431 Malignant neoplasm of lower lobe, right bronchus or lung: Secondary | ICD-10-CM | POA: Diagnosis present

## 2019-07-08 DIAGNOSIS — F0150 Vascular dementia without behavioral disturbance: Secondary | ICD-10-CM | POA: Diagnosis present

## 2019-07-08 DIAGNOSIS — I2510 Atherosclerotic heart disease of native coronary artery without angina pectoris: Secondary | ICD-10-CM | POA: Diagnosis present

## 2019-07-08 DIAGNOSIS — I272 Pulmonary hypertension, unspecified: Secondary | ICD-10-CM | POA: Diagnosis present

## 2019-07-08 DIAGNOSIS — J449 Chronic obstructive pulmonary disease, unspecified: Secondary | ICD-10-CM | POA: Diagnosis present

## 2019-07-08 DIAGNOSIS — R062 Wheezing: Secondary | ICD-10-CM

## 2019-07-08 DIAGNOSIS — Z20828 Contact with and (suspected) exposure to other viral communicable diseases: Secondary | ICD-10-CM | POA: Diagnosis present

## 2019-07-08 DIAGNOSIS — E861 Hypovolemia: Secondary | ICD-10-CM | POA: Diagnosis present

## 2019-07-08 DIAGNOSIS — F0151 Vascular dementia with behavioral disturbance: Secondary | ICD-10-CM | POA: Diagnosis not present

## 2019-07-08 DIAGNOSIS — I5032 Chronic diastolic (congestive) heart failure: Secondary | ICD-10-CM | POA: Diagnosis present

## 2019-07-08 DIAGNOSIS — Z7984 Long term (current) use of oral hypoglycemic drugs: Secondary | ICD-10-CM | POA: Diagnosis not present

## 2019-07-08 DIAGNOSIS — I10 Essential (primary) hypertension: Secondary | ICD-10-CM

## 2019-07-08 DIAGNOSIS — Z87891 Personal history of nicotine dependence: Secondary | ICD-10-CM | POA: Diagnosis not present

## 2019-07-08 DIAGNOSIS — R0602 Shortness of breath: Secondary | ICD-10-CM

## 2019-07-08 DIAGNOSIS — E1122 Type 2 diabetes mellitus with diabetic chronic kidney disease: Secondary | ICD-10-CM | POA: Diagnosis present

## 2019-07-08 DIAGNOSIS — I959 Hypotension, unspecified: Secondary | ICD-10-CM | POA: Diagnosis present

## 2019-07-08 DIAGNOSIS — E860 Dehydration: Secondary | ICD-10-CM | POA: Diagnosis present

## 2019-07-08 LAB — BASIC METABOLIC PANEL
Anion gap: 17 — ABNORMAL HIGH (ref 5–15)
BUN: 40 mg/dL — ABNORMAL HIGH (ref 8–23)
CO2: 18 mmol/L — ABNORMAL LOW (ref 22–32)
Calcium: 7.2 mg/dL — ABNORMAL LOW (ref 8.9–10.3)
Chloride: 108 mmol/L (ref 98–111)
Creatinine, Ser: 4.18 mg/dL — ABNORMAL HIGH (ref 0.61–1.24)
GFR calc Af Amer: 15 mL/min — ABNORMAL LOW (ref >60)
GFR calc non Af Amer: 13 mL/min — ABNORMAL LOW (ref >60)
Glucose, Bld: 86 mg/dL (ref 70–99)
Potassium: 2.8 mmol/L — ABNORMAL LOW (ref 3.5–5.1)
Sodium: 143 mmol/L (ref 135–145)

## 2019-07-08 LAB — URINALYSIS, ROUTINE W REFLEX MICROSCOPIC
Bilirubin Urine: NEGATIVE
Glucose, UA: NEGATIVE mg/dL
Ketones, ur: NEGATIVE mg/dL
Leukocytes,Ua: NEGATIVE
Nitrite: NEGATIVE
Protein, ur: 30 mg/dL — AB
Specific Gravity, Urine: 1.02 (ref 1.005–1.030)
pH: 5 (ref 5.0–8.0)

## 2019-07-08 LAB — CBC WITH DIFFERENTIAL/PLATELET
Abs Immature Granulocytes: 0.29 10*3/uL — ABNORMAL HIGH (ref 0.00–0.07)
Basophils Absolute: 0 10*3/uL (ref 0.0–0.1)
Basophils Relative: 0 %
Eosinophils Absolute: 0 10*3/uL (ref 0.0–0.5)
Eosinophils Relative: 0 %
HCT: 32.1 % — ABNORMAL LOW (ref 39.0–52.0)
Hemoglobin: 10.4 g/dL — ABNORMAL LOW (ref 13.0–17.0)
Immature Granulocytes: 1 %
Lymphocytes Relative: 2 %
Lymphs Abs: 0.4 10*3/uL — ABNORMAL LOW (ref 0.7–4.0)
MCH: 30.3 pg (ref 26.0–34.0)
MCHC: 32.4 g/dL (ref 30.0–36.0)
MCV: 93.6 fL (ref 80.0–100.0)
Monocytes Absolute: 1.2 10*3/uL — ABNORMAL HIGH (ref 0.1–1.0)
Monocytes Relative: 5 %
Neutro Abs: 21.6 10*3/uL — ABNORMAL HIGH (ref 1.7–7.7)
Neutrophils Relative %: 92 %
Platelets: 198 10*3/uL (ref 150–400)
RBC: 3.43 MIL/uL — ABNORMAL LOW (ref 4.22–5.81)
RDW: 17.9 % — ABNORMAL HIGH (ref 11.5–15.5)
WBC: 23.5 10*3/uL — ABNORMAL HIGH (ref 4.0–10.5)
nRBC: 0 % (ref 0.0–0.2)

## 2019-07-08 LAB — MAGNESIUM: Magnesium: 0.8 mg/dL — CL (ref 1.7–2.4)

## 2019-07-08 LAB — COMPREHENSIVE METABOLIC PANEL
ALT: 10 U/L (ref 0–44)
AST: 16 U/L (ref 15–41)
Albumin: 3.3 g/dL — ABNORMAL LOW (ref 3.5–5.0)
Alkaline Phosphatase: 61 U/L (ref 38–126)
Anion gap: 16 — ABNORMAL HIGH (ref 5–15)
BUN: 38 mg/dL — ABNORMAL HIGH (ref 8–23)
CO2: 24 mmol/L (ref 22–32)
Calcium: 7.8 mg/dL — ABNORMAL LOW (ref 8.9–10.3)
Chloride: 102 mmol/L (ref 98–111)
Creatinine, Ser: 4.54 mg/dL — ABNORMAL HIGH (ref 0.61–1.24)
GFR calc Af Amer: 14 mL/min — ABNORMAL LOW (ref >60)
GFR calc non Af Amer: 12 mL/min — ABNORMAL LOW (ref >60)
Glucose, Bld: 108 mg/dL — ABNORMAL HIGH (ref 70–99)
Potassium: 2.2 mmol/L — CL (ref 3.5–5.1)
Sodium: 142 mmol/L (ref 135–145)
Total Bilirubin: 1.3 mg/dL — ABNORMAL HIGH (ref 0.3–1.2)
Total Protein: 6.7 g/dL (ref 6.5–8.1)

## 2019-07-08 LAB — CBG MONITORING, ED: Glucose-Capillary: 107 mg/dL — ABNORMAL HIGH (ref 70–99)

## 2019-07-08 LAB — LIPASE, BLOOD: Lipase: 19 U/L (ref 11–51)

## 2019-07-08 LAB — APTT: aPTT: 46 seconds — ABNORMAL HIGH (ref 24–36)

## 2019-07-08 LAB — LACTIC ACID, PLASMA
Lactic Acid, Venous: 2.1 mmol/L (ref 0.5–1.9)
Lactic Acid, Venous: 2.1 mmol/L (ref 0.5–1.9)
Lactic Acid, Venous: 3.9 mmol/L (ref 0.5–1.9)

## 2019-07-08 LAB — SARS CORONAVIRUS 2 BY RT PCR (HOSPITAL ORDER, PERFORMED IN ~~LOC~~ HOSPITAL LAB): SARS Coronavirus 2: NEGATIVE

## 2019-07-08 LAB — ETHANOL: Alcohol, Ethyl (B): <10 mg/dL (ref <10)

## 2019-07-08 LAB — SALICYLATE LEVEL: Salicylate Lvl: <7 mg/dL (ref 2.8–30.0)

## 2019-07-08 LAB — GLUCOSE, CAPILLARY: Glucose-Capillary: 73 mg/dL (ref 70–99)

## 2019-07-08 LAB — PROTIME-INR
INR: 1.4 — ABNORMAL HIGH (ref 0.8–1.2)
Prothrombin Time: 16.5 seconds — ABNORMAL HIGH (ref 11.4–15.2)

## 2019-07-08 LAB — ACETAMINOPHEN LEVEL: Acetaminophen (Tylenol), Serum: <10 ug/mL — ABNORMAL LOW (ref 10–30)

## 2019-07-08 MED ORDER — DOCUSATE SODIUM 283 MG RE ENEM
1.0000 | ENEMA | RECTAL | Status: DC | PRN
  Filled 2019-07-08: qty 1

## 2019-07-08 MED ORDER — ORAL CARE MOUTH RINSE
15.0000 mL | Freq: Two times a day (BID) | OROMUCOSAL | Status: DC
  Administered 2019-07-09 – 2019-07-16 (×11): 15 mL via OROMUCOSAL

## 2019-07-08 MED ORDER — ONDANSETRON HCL 4 MG PO TABS: 4.0000 mg | ORAL_TABLET | Freq: Four times a day (QID) | ORAL | Status: DC | PRN

## 2019-07-08 MED ORDER — CLONIDINE HCL 0.1 MG PO TABS: 0.1000 mg | ORAL_TABLET | Freq: Two times a day (BID) | ORAL | Status: DC

## 2019-07-08 MED ORDER — ACETAMINOPHEN 650 MG RE SUPP: 650.0000 mg | Freq: Four times a day (QID) | RECTAL | Status: DC | PRN

## 2019-07-08 MED ORDER — SODIUM CHLORIDE 0.9 % IV BOLUS
500.0000 mL | Freq: Once | INTRAVENOUS | Status: AC
  Administered 2019-07-08: 500 mL via INTRAVENOUS

## 2019-07-08 MED ORDER — FOLIC ACID 1 MG PO TABS
1.0000 mg | ORAL_TABLET | Freq: Every day | ORAL | Status: DC
  Administered 2019-07-09 – 2019-07-16 (×8): 1 mg via ORAL
  Filled 2019-07-08 (×8): qty 1

## 2019-07-08 MED ORDER — SUCRALFATE 1 G PO TABS
1.0000 g | ORAL_TABLET | Freq: Every day | ORAL | Status: DC
  Administered 2019-07-08 – 2019-07-16 (×38): 1 g via ORAL
  Filled 2019-07-08 (×38): qty 1

## 2019-07-08 MED ORDER — ALLOPURINOL 300 MG PO TABS
300.0000 mg | ORAL_TABLET | Freq: Every day | ORAL | Status: DC
  Administered 2019-07-09 – 2019-07-16 (×8): 300 mg via ORAL
  Filled 2019-07-08 (×8): qty 1

## 2019-07-08 MED ORDER — CAMPHOR-MENTHOL 0.5-0.5 % EX LOTN
1.0000 "application " | TOPICAL_LOTION | Freq: Three times a day (TID) | CUTANEOUS | Status: DC | PRN
  Filled 2019-07-08: qty 222

## 2019-07-08 MED ORDER — SODIUM CHLORIDE 0.9 % IV BOLUS
1000.0000 mL | Freq: Once | INTRAVENOUS | Status: AC
  Administered 2019-07-08: 1000 mL via INTRAVENOUS

## 2019-07-08 MED ORDER — ATORVASTATIN CALCIUM 10 MG PO TABS
20.0000 mg | ORAL_TABLET | Freq: Every day | ORAL | Status: DC
  Administered 2019-07-09 – 2019-07-16 (×8): 20 mg via ORAL
  Filled 2019-07-08 (×8): qty 2

## 2019-07-08 MED ORDER — HEPARIN SODIUM (PORCINE) 5000 UNIT/ML IJ SOLN
5000.0000 [IU] | Freq: Three times a day (TID) | INTRAMUSCULAR | Status: DC
  Administered 2019-07-08 – 2019-07-16 (×21): 5000 [IU] via SUBCUTANEOUS
  Filled 2019-07-08 (×21): qty 1

## 2019-07-08 MED ORDER — SODIUM CHLORIDE 0.9 % IV SOLN
2.0000 g | Freq: Once | INTRAVENOUS | Status: AC
  Administered 2019-07-08: 2 g via INTRAVENOUS
  Filled 2019-07-08: qty 2

## 2019-07-08 MED ORDER — LACTATED RINGERS IV SOLN
INTRAVENOUS | Status: DC
  Administered 2019-07-08 – 2019-07-11 (×5): via INTRAVENOUS

## 2019-07-08 MED ORDER — LACTATED RINGERS IV BOLUS
500.0000 mL | Freq: Once | INTRAVENOUS | Status: AC
  Administered 2019-07-08: 500 mL via INTRAVENOUS

## 2019-07-08 MED ORDER — SORBITOL 70 % SOLN: 30.0000 mL | Status: DC | PRN

## 2019-07-08 MED ORDER — POTASSIUM CHLORIDE 10 MEQ/100ML IV SOLN
10.0000 meq | INTRAVENOUS | Status: DC
  Administered 2019-07-08 (×2): 10 meq via INTRAVENOUS
  Filled 2019-07-08 (×2): qty 100

## 2019-07-08 MED ORDER — ACETAMINOPHEN 325 MG PO TABS
650.0000 mg | ORAL_TABLET | Freq: Four times a day (QID) | ORAL | Status: DC | PRN
  Administered 2019-07-14 (×2): 650 mg via ORAL
  Filled 2019-07-08 (×2): qty 2

## 2019-07-08 MED ORDER — POTASSIUM CHLORIDE CRYS ER 20 MEQ PO TBCR
40.0000 meq | EXTENDED_RELEASE_TABLET | Freq: Once | ORAL | Status: AC
  Administered 2019-07-08: 40 meq via ORAL
  Filled 2019-07-08: qty 2

## 2019-07-08 MED ORDER — FERROUS SULFATE 325 (65 FE) MG PO TABS
325.0000 mg | ORAL_TABLET | ORAL | Status: DC
  Administered 2019-07-09 – 2019-07-16 (×4): 325 mg via ORAL
  Filled 2019-07-08 (×6): qty 1

## 2019-07-08 MED ORDER — BISOPROLOL FUMARATE 5 MG PO TABS: 5.0000 mg | ORAL_TABLET | Freq: Every day | ORAL | Status: DC

## 2019-07-08 MED ORDER — INSULIN ASPART 100 UNIT/ML ~~LOC~~ SOLN
0.0000 [IU] | Freq: Three times a day (TID) | SUBCUTANEOUS | Status: DC
  Administered 2019-07-09: 3 [IU] via SUBCUTANEOUS
  Administered 2019-07-09 – 2019-07-14 (×5): 2 [IU] via SUBCUTANEOUS

## 2019-07-08 MED ORDER — MAGNESIUM SULFATE 2 GM/50ML IV SOLN
2.0000 g | Freq: Once | INTRAVENOUS | Status: AC
  Administered 2019-07-08: 2 g via INTRAVENOUS
  Filled 2019-07-08: qty 50

## 2019-07-08 MED ORDER — VANCOMYCIN HCL 10 G IV SOLR
1500.0000 mg | Freq: Once | INTRAVENOUS | Status: AC
  Administered 2019-07-08: 1500 mg via INTRAVENOUS
  Filled 2019-07-08: qty 1500

## 2019-07-08 MED ORDER — NEPRO/CARBSTEADY PO LIQD: 237.0000 mL | Freq: Three times a day (TID) | ORAL | Status: DC | PRN

## 2019-07-08 MED ORDER — HYDROXYZINE HCL 25 MG PO TABS: 25.0000 mg | ORAL_TABLET | Freq: Three times a day (TID) | ORAL | Status: DC | PRN

## 2019-07-08 MED ORDER — ALBUTEROL SULFATE (2.5 MG/3ML) 0.083% IN NEBU: 2.5000 mg | INHALATION_SOLUTION | RESPIRATORY_TRACT | Status: DC | PRN

## 2019-07-08 MED ORDER — POTASSIUM CHLORIDE 10 MEQ/100ML IV SOLN
10.0000 meq | INTRAVENOUS | Status: AC
  Administered 2019-07-08: 10 meq via INTRAVENOUS
  Filled 2019-07-08 (×2): qty 100

## 2019-07-08 MED ORDER — METRONIDAZOLE IN NACL 5-0.79 MG/ML-% IV SOLN
500.0000 mg | Freq: Once | INTRAVENOUS | Status: AC
  Administered 2019-07-08: 500 mg via INTRAVENOUS
  Filled 2019-07-08: qty 100

## 2019-07-08 MED ORDER — ONDANSETRON HCL 4 MG/2ML IJ SOLN: 4.0000 mg | Freq: Four times a day (QID) | INTRAMUSCULAR | Status: DC | PRN

## 2019-07-08 MED ORDER — SENNOSIDES-DOCUSATE SODIUM 8.6-50 MG PO TABS
1.0000 | ORAL_TABLET | Freq: Every day | ORAL | Status: DC
  Administered 2019-07-08 – 2019-07-15 (×8): 1 via ORAL
  Filled 2019-07-08 (×8): qty 1

## 2019-07-08 MED ORDER — SODIUM CHLORIDE 0.9 % IV SOLN
INTRAVENOUS | Status: DC
  Administered 2019-07-10: 14:00:00 via INTRAVENOUS

## 2019-07-08 MED ORDER — PANTOPRAZOLE SODIUM 40 MG PO TBEC
40.0000 mg | DELAYED_RELEASE_TABLET | Freq: Two times a day (BID) | ORAL | Status: DC
  Administered 2019-07-08 – 2019-07-13 (×10): 40 mg via ORAL
  Filled 2019-07-08 (×9): qty 1

## 2019-07-08 MED ORDER — MOMETASONE FURO-FORMOTEROL FUM 200-5 MCG/ACT IN AERO
2.0000 | INHALATION_SPRAY | Freq: Two times a day (BID) | RESPIRATORY_TRACT | Status: DC
  Administered 2019-07-08 – 2019-07-10 (×5): 2 via RESPIRATORY_TRACT
  Filled 2019-07-08: qty 8.8

## 2019-07-08 MED ORDER — VANCOMYCIN HCL IN DEXTROSE 1-5 GM/200ML-% IV SOLN: 1000.0000 mg | Freq: Once | INTRAVENOUS | Status: DC

## 2019-07-08 MED ORDER — ZOLPIDEM TARTRATE 5 MG PO TABS: 5.0000 mg | ORAL_TABLET | Freq: Every evening | ORAL | Status: DC | PRN

## 2019-07-08 MED ORDER — CALCIUM CARBONATE ANTACID 1250 MG/5ML PO SUSP
500.0000 mg | Freq: Four times a day (QID) | ORAL | Status: DC | PRN
  Administered 2019-07-09 – 2019-07-10 (×2): 500 mg via ORAL
  Filled 2019-07-08 (×3): qty 5

## 2019-07-08 MED ORDER — FLUTICASONE FUROATE-VILANTEROL 100-25 MCG/INH IN AEPB
1.0000 | INHALATION_SPRAY | Freq: Two times a day (BID) | RESPIRATORY_TRACT | Status: DC
  Administered 2019-07-08 – 2019-07-10 (×4): 1 via RESPIRATORY_TRACT
  Filled 2019-07-08: qty 28

## 2019-07-08 MED ORDER — SODIUM CHLORIDE 0.9 % IV BOLUS
250.0000 mL | Freq: Once | INTRAVENOUS | Status: AC
  Administered 2019-07-08: 250 mL via INTRAVENOUS

## 2019-07-08 NOTE — ED Provider Notes (Signed)
[facility] EMERGENCY DEPARTMENT Provider Note   CSN: [id number] Arrival date & time: 07/08/19  9629    History   Chief Complaint Chief Complaint  Patient presents with  . Gait Problem    HPI John [name] is a 73 y.o. male with past medical history of lung cancer, MI, hypertension, vascular dementia, CHF, COPD, DM, CKD, CAD, who presents today for evaluation of acting different.  History is primarily obtained from patient's daughter and granddaughter.  His daughter has been staying with him according to patient.  Patient stated that he had 2 syncopal episodes today, that occurred while he was outside cleaning bushes, however his daughter and granddaughter report that he has not been outside cleaning bushes and did not pass out today.  They state that he was seen on 7/6 after he fell and was sent home.  He has a history of dementia.  They say that usually he is able to walk however he has been more unsteady on his feet and unsure constantly holding onto something while he walks.  His granddaughter states that he was supposed to go for a biopsy this morning, however was feeling unwell and did not wish to go.  They state that he has had poor p.o. intake since Sunday.  They state that he has been taking his medicines, they arrive prepackaged and his family gives them to him to take therefore not concerned that he may have taken additional blood pressure medicines.  Patient tells me that he has been taking aspirin and Tylenol for generally feeling unwell.  He is alert, oriented to person and place, not to time thinking that it is the year 2000 and that it is January.  According to his granddaughter this is abnormal for him.  They report that he has not been sick recently other than having a new mass discovered in his lung.  He recently had a negative coronavirus test in preparation for his biopsy.     HPI  Past Medical History:  Diagnosis Date  . B12 deficiency   . CHF  (congestive heart failure) ([place])   . COPD (chronic obstructive pulmonary disease) ([place])   . Diabetes mellitus without complication ([place])   . Folate deficiency   . Gout   . Hyperlipemia   . Hypertension   . 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 MI (myocardial infarction) ([place])   . Pulmonary hypertension [facility])     Patient Active Problem List   Diagnosis Date Noted  . Vascular dementia without behavioral disturbance ([place])   . Helicobacter pylori gastritis   . Chronic duodenal ulcer with hemorrhage   . Multiple gastric ulcers   . Acute esophagitis   . Gastrointestinal hemorrhage 04/20/2019  . Vitamin B12 deficiency 04/20/2019  . Folate deficiency 04/20/2019  . Dehydration 04/20/2019  . Hypotension 04/20/2019  . Melena   . Acute blood loss anemia   . CKD (chronic kidney disease), stage III ([place]) 04/08/2019  . Right lower lobe lung mass 04/08/2019  . Syncope and collapse 04/08/2019  . Symptomatic anemia 04/08/2019  . Occult GI bleeding 04/08/2019  . Hyperglycemia 04/08/2019  . Leukocytosis 04/08/2019  . Syncope 04/08/2019  . Low hemoglobin   . Dyspnea on exertion 08/25/2017  . Bursitis of elbow 03/15/2016  . Obesity (BMI 30-39.9) 12/03/2015  . Primary cancer of right lower lobe of lung ([place]) 06/14/2015  . Solitary pulmonary nodule 03/13/2015  . RVF (right ventricular  failure) ([place]) 11/04/2014  . COPD II/III with reversibility  12/19/2012  . Chronic respiratory failure with hypoxia ([place]) 12/18/2012  . Drug-induced hyperglycemia 12/08/2012  . Allergic drug rash 12/06/2012  . Pulmonary HTN ([place]) 03/18/2012  . Abnormal echocardiogram 03/18/2012  . LV dysfunction 03/18/2012  . COPD exacerbation ([place]) 02/22/2012  . Chronic diastolic CHF (congestive heart failure) ([place]) 02/22/2012  . HTN (hypertension) 02/22/2012  . CAD (coronary artery disease) 02/22/2012  . Acute on chronic systolic heart failure ([place]) 02/22/2012    Past Surgical  History:  Procedure Laterality Date  . APPENDECTOMY    . BIOPSY  04/21/2019   Procedure: BIOPSY;  Surgeon: Gatha Mayer, MD;  Location: [facility];  Service: Endoscopy;;  . ESOPHAGOGASTRODUODENOSCOPY (EGD) WITH PROPOFOL N/A 04/21/2019   Procedure: ESOPHAGOGASTRODUODENOSCOPY (EGD) WITH PROPOFOL;  Surgeon: Gatha Mayer, MD;  Location: [facility];  Service: Endoscopy;  Laterality: N/A;  . ESOPHAGOGASTRODUODENOSCOPY (EGD) WITH PROPOFOL N/A 04/29/2019   Procedure: ESOPHAGOGASTRODUODENOSCOPY (EGD) WITH PROPOFOL;  Surgeon: Mauri Pole, MD;  Location: [facility];  Service: Endoscopy;  Laterality: N/A;  . HEMOSTASIS CLIP PLACEMENT  04/29/2019   Procedure: HEMOSTASIS CLIP PLACEMENT;  Surgeon: Mauri Pole, MD;  Location: [facility];  Service: Endoscopy;;  Clip placed as marker not for bleeding control  . IR ANGIOGRAM SELECTIVE EACH ADDITIONAL VESSEL  04/29/2019  . IR ANGIOGRAM SELECTIVE EACH ADDITIONAL VESSEL  04/29/2019  . IR ANGIOGRAM SELECTIVE EACH ADDITIONAL VESSEL  04/29/2019  . IR ANGIOGRAM VISCERAL SELECTIVE  04/29/2019  . IR EMBO ART  VEN HEMORR LYMPH EXTRAV  INC GUIDE ROADMAPPING  04/29/2019  . IR US GUIDE VASC ACCESS RIGHT  04/29/2019  . LUNG BIOPSY          Home Medications    Prior to Admission medications   Medication Sig Start Date End Date Taking? Authorizing Provider  albuterol (PROAIR HFA) 108 (90 BASE) MCG/ACT inhaler INHALE 2 PUFFS BY MOUTH EVERY 4 HOURS AS NEEDED FOR WHEEZING Patient taking differently: Inhale 2 puffs into the lungs every 4 (four) hours as needed for wheezing.  03/13/15  Yes Tanda Rockers, MD  allopurinol (ZYLOPRIM) 300 MG tablet Take 300 mg by mouth daily. 04/07/19  Yes [provider]  amLODipine (NORVASC) 10 MG tablet Take 10 mg by mouth daily. 05/07/19  Yes [provider]  atorvastatin (LIPITOR) 20 MG tablet Take 1 tablet (20 mg total) by mouth daily. 09/26/16  Yes Tanda Rockers, MD  bisoprolol (ZEBETA) 5 MG tablet  Take 1 tablet (5 mg total) by mouth daily. 09/26/16  Yes Tanda Rockers, MD  BREO ELLIPTA 100-25 MCG/INH AEPB Inhale 1 puff into the lungs 2 (two) times a day.  05/07/19  Yes [provider]  budesonide-formoterol (SYMBICORT) 160-4.5 MCG/ACT inhaler INHALE 2 PUFFS BY MOUTH EVERY 12 HOURS (FIRST THING IN THE MORNING THEN 12 HOURS LATER) Patient taking differently: Inhale 2 puffs into the lungs every 12 (twelve) hours.  03/24/17  Yes Tanda Rockers, MD  Cholecalciferol (VITAMIN D3) 50 MCG (2000 UT) capsule Take 2,000 Units by mouth daily.  05/07/19  Yes [provider]  cloNIDine (CATAPRES) 0.1 MG tablet Take 0.1 mg by mouth 2 (two) times daily. 05/07/19  Yes [provider]  ferrous sulfate 325 (65 FE) MG tablet Take 1 tablet (325 mg total) by mouth every Monday, Wednesday, and Friday. 05/03/19 08/01/19 Yes Florencia Reasons, MD  folic acid (FOLVITE) 1 MG tablet Take 1 tablet (1 mg total) by mouth daily. 04/09/19 04/08/20  Yes Donne Hazel, MD  furosemide (LASIX) 20 MG tablet Take 1 tablet (20 mg total) by mouth every Monday, Wednesday, and Friday. 05/03/19  Yes Florencia Reasons, MD  metFORMIN (GLUCOPHAGE) 1000 MG tablet Take 0.5 tablets (500 mg total) by mouth 2 (two) times daily. 05/01/19  Yes Florencia Reasons, MD  naproxen (NAPROSYN) 500 MG tablet Take 500 mg by mouth 2 (two) times daily as needed (pain).  05/07/19  Yes [provider]  pantoprazole (PROTONIX) 40 MG tablet Take 1 tablet (40 mg total) by mouth 2 (two) times daily before a meal. 05/28/19  Yes Nandigam, Kavitha V, MD  senna-docusate (SENOKOT-S) 8.6-50 MG tablet Take 1 tablet by mouth at bedtime. 05/01/19  Yes Florencia Reasons, MD  spironolactone (ALDACTONE) 25 MG tablet Take 12.5 mg by mouth daily. 05/07/19  Yes [provider]  sucralfate (CARAFATE) 1 g tablet Take 1 g by mouth See admin instructions. Four time daily and at bedtime 06/23/19  Yes [provider]  Vitamin D, Ergocalciferol, (DRISDOL) 1.25 MG (50000 UT) CAPS capsule Take  50,000 Units by mouth every Friday.  03/10/19  Yes [provider]    Family History Family History  Problem Relation Age of Onset  . Heart disease Mother   . Cancer Neg Hx     Social History Social History   Tobacco Use  . Smoking status: Former Smoker    Packs/day: 1.50    Years: 60.00    Pack years: 90.00    Types: Cigarettes    Quit date: 09/29/2012    Years since quitting: 6.7  . Smokeless tobacco: Never Used  Substance Use Topics  . Alcohol use: No    Alcohol/week: 0.0 standard drinks  . Drug use: No     Allergies   Levaquin [levofloxacin in d5w] and Lisinopril   Review of Systems Review of Systems  Unable to perform ROS: Dementia     Physical Exam Updated Vital Signs BP (!) 98/57   Pulse 72   Temp 98.2 F (36.8 C) (Rectal)   Resp 20   Ht 5\' 4"  (1.626 m)   Wt 87 kg   SpO2 92%   BMI 32.92 kg/m   Physical Exam Vitals signs and nursing note reviewed.  Constitutional:      General: He is not in acute distress.    Appearance: He is well-developed. He is not diaphoretic.  HENT:     Head: Normocephalic and atraumatic.     Mouth/Throat:     Mouth: Mucous membranes are moist.  Eyes:     General: No scleral icterus.       Right eye: No discharge.        Left eye: No discharge.     Conjunctiva/sclera: Conjunctivae normal.  Neck:     Musculoskeletal: Normal range of motion and neck supple. No neck rigidity.  Cardiovascular:     Rate and Rhythm: Normal rate and regular rhythm.     Pulses: Normal pulses.     Heart sounds: Normal heart sounds.  Pulmonary:     Effort: Pulmonary effort is normal. No respiratory distress.     Breath sounds: Normal breath sounds. No stridor.  Abdominal:     General: There is no distension.     Tenderness: There is no abdominal tenderness.  Musculoskeletal:        General: No deformity.  Skin:    General: Skin is warm and dry.  Neurological:     Mental Status: He is alert.  Motor: No abnormal muscle  tone.     Comments: Oriented to person and place, not to time.  Is able to lift bilateral arms and legs.  Coordination is grossly intact and that he is able to grab objects without difficulty.  There is no slurred speech or facial droop.  Psychiatric:        Mood and Affect: Mood normal.        Behavior: Behavior normal.      ED Treatments / Results  Labs (all labs ordered are listed, but only abnormal results are displayed) Labs Reviewed  COMPREHENSIVE METABOLIC PANEL - Abnormal; Notable for the following components:      Result Value   Potassium 2.2 (*)    Glucose, Bld 108 (*)    BUN 38 (*)    Creatinine, Ser 4.54 (*)    Calcium 7.8 (*)    Albumin 3.3 (*)    Total Bilirubin 1.3 (*)    GFR calc non Af Amer 12 (*)    GFR calc Af Amer 14 (*)    Anion gap 16 (*)    All other components within normal limits  CBC WITH DIFFERENTIAL/PLATELET - Abnormal; Notable for the following components:   WBC 23.5 (*)    RBC 3.43 (*)    Hemoglobin 10.4 (*)    HCT 32.1 (*)    RDW 17.9 (*)    Neutro Abs 21.6 (*)    Lymphs Abs 0.4 (*)    Monocytes Absolute 1.2 (*)    Abs Immature Granulocytes 0.29 (*)    All other components within normal limits  URINALYSIS, ROUTINE W REFLEX MICROSCOPIC - Abnormal; Notable for the following components:   APPearance HAZY (*)    Hgb urine dipstick SMALL (*)    Protein, ur 30 (*)    Bacteria, UA RARE (*)    All other components within normal limits  ACETAMINOPHEN LEVEL - Abnormal; Notable for the following components:   Acetaminophen (Tylenol), Serum <10 (*)    All other components within normal limits  LACTIC ACID, PLASMA - Abnormal; Notable for the following components:   Lactic Acid, Venous 2.1 (*)    All other components within normal limits  LACTIC ACID, PLASMA - Abnormal; Notable for the following components:   Lactic Acid, Venous 2.1 (*)    All other components within normal limits  APTT - Abnormal; Notable for the following components:   aPTT 46  (*)    All other components within normal limits  PROTIME-INR - Abnormal; Notable for the following components:   Prothrombin Time 16.5 (*)    INR 1.4 (*)    All other components within normal limits  MAGNESIUM - Abnormal; Notable for the following components:   Magnesium 0.8 (*)    All other components within normal limits  CBG MONITORING, ED - Abnormal; Notable for the following components:   Glucose-Capillary 107 (*)    All other components within normal limits  SARS CORONAVIRUS 2 (HOSPITAL ORDER, [facility])  URINE CULTURE  CULTURE, BLOOD (ROUTINE X 2)  CULTURE, BLOOD (ROUTINE X 2)  ETHANOL  LIPASE, BLOOD  SALICYLATE LEVEL  BASIC METABOLIC PANEL    EKG EKG Interpretation  Date/Time:  Thursday July 08 2019 10:55:05 EDT Ventricular Rate:  66 PR Interval:    QRS Duration: 101 QT Interval:  414 QTC Calculation: 434 R Axis:   22 Text Interpretation:  Sinus rhythm When compared toprior, no significant changes seen.  No STEMI Confirmed  by Antony Blackbird [phone]) on 07/08/2019 11:45:27 AM   Radiology Dg Chest Port 1 View  Result Date: 07/08/2019 CLINICAL DATA:  Shortness of breath and fever. EXAM: PORTABLE CHEST 1 VIEW COMPARISON:  PET-CT May 26, 2019 and chest radiograph April 28, 2019 FINDINGS: The previously noted right lower lobe mass is again apparent, measuring 5.1 x 3.8 cm. There is no appreciable edema or consolidation. Heart is mildly enlarged with pulmonary vascularity normal. No adenopathy evident. No bone lesions. IMPRESSION: Persistent right lower lobe mass. No new opacity evident. Heart mildly enlarged. No adenopathy demonstrable by radiography. Electronically Signed   By: Lowella Grip III M.D.   On: 07/08/2019 11:33    Procedures .Critical Care Performed by: Lorin Glass, PA-C Authorized by: Lorin Glass, PA-C   Critical care provider statement:    Critical care time (minutes):  45   Critical care was necessary to  treat or prevent imminent or life-threatening deterioration of the following conditions:  Shock, dehydration and renal failure   Critical care was time spent personally by me on the following activities:  Discussions with consultants, evaluation of patient's response to treatment, examination of patient, ordering and performing treatments and interventions, ordering and review of laboratory studies, ordering and review of radiographic studies, pulse oximetry, re-evaluation of patient's condition, obtaining history from patient or surrogate and review of old charts   (including critical care time)  Medications Ordered in ED Medications  potassium chloride 10 mEq in 100 mL IVPB (10 mEq Intravenous New Bag/Given 07/08/19 1504)  sodium chloride 0.9 % bolus 500 mL (0 mLs Intravenous Stopped 07/08/19 1258)  sodium chloride 0.9 % bolus 1,000 mL (0 mLs Intravenous Stopped 07/08/19 1500)  sodium chloride 0.9 % bolus 500 mL (0 mLs Intravenous Stopped 07/08/19 1258)  potassium chloride SA (K-DUR) CR tablet 40 mEq (40 mEq Oral Given 07/08/19 1204)  ceFEPIme (MAXIPIME) 2 g in sodium chloride 0.9 % 100 mL IVPB (0 g Intravenous Stopped 07/08/19 1254)  metroNIDAZOLE (FLAGYL) IVPB 500 mg (0 mg Intravenous Stopped 07/08/19 1310)  vancomycin (VANCOCIN) 1,500 mg in sodium chloride 0.9 % 500 mL IVPB (0 mg Intravenous Stopped 07/08/19 1503)  magnesium sulfate IVPB 2 g 50 mL (0 g Intravenous Stopped 07/08/19 1459)     Initial Impression / Assessment and Plan / ED Course  I have reviewed the triage vital signs and the nursing notes.  Pertinent labs & imaging results that were available during my care of the patient were reviewed by me and considered in my medical decision making (see chart for details).  Clinical Course as of Jul 08 1515  Thu Jul 08, 2019  1130 Potassium(!!): 2.2 [EH]  1401 Blood pressure is significantly improved.   [EH]  39 Spoke with PCCM APP, they state that as long as not needing pressors not a PCCM  admit.     [EH]  Ross with Dr. Lorin Mercy who will see patient.    [EH]    Clinical Course User Index [EH] Lorin Glass, PA-C      Patient presents today for evaluation of generalized weakness.  History primarily obtained from patient's daughter and granddaughter as patient has history of dementia.  Patient was in the emergency room on 07/05/2019 after a fall at which point CT head and neck were obtained without evidence of acute injury.  Family reports that since then patient has been more confused and generally weak.  Patient is awake, alert, oriented to person and place, not to time.  He is hypotensive with systolics in the mid to low 80s, as low as 82 systolic.  As were obtained and reviewed, he has a significant leukocytosis of 23.5 with neutrophils of 21.6.  He is markedly hypokalemic with a potassium of 2.2, magnesium is low at 0.8, he is given p.o. K-Dur and IV potassium and IV magnesium were both ordered.  His creatinine is significantly elevated at 4.54, up from his baseline of 1.18.  This represents acute kidney injury/renal failure.  With his intermittent borderline tachypnea, his leukocytosis, and hypotension he was started on broad-spectrum antibiotics.  He was given 30 mL/kg, after which his blood pressure improved into the low 532Y systolic.  His AST and ALT are unremarkable, however he does appear to have a mild coagulopathy with abated at 1.42, APTT elevated at 46, and he does not take any blood thinning medicines.  As he reported taking aspirin and acetaminophen, levels for those were obtained which were negative.  Chest x-ray was obtained without evidence of acute consolidation.  COVID test was negative.  Blood cultures were obtained prior to the administration of antibiotics.  Urine has small blood, however does not appear infected.  Urine culture was obtained.    This patient was seen as a shared visit with Dr. Sherry Ruffing.    I spoke with PCCM who recommended medicine  admission.    I spoke with Dr. Lorin Mercy who will see patient for admission.    Final Clinical Impressions(s) / ED Diagnoses   Final diagnoses:  Acute renal failure, unspecified acute renal failure type ([place])  Coagulopathy (HCC)  Dehydration  Leukocytosis, unspecified type  Generalized weakness  Hypokalemia  Hypomagnesemia  Hypotension, unspecified hypotension type    ED Discharge Orders    None       Lorin Glass, [place] 07/08/19 1533    Tegeler, Gwenyth Allegra, MD 07/09/19 [phone]

## 2019-07-08 NOTE — ED Notes (Signed)
Pt vancomycin pump was alarming "air in line", this RN resolved the alarm.

## 2019-07-08 NOTE — ED Triage Notes (Addendum)
Patient arrived from home via [facility]. Family reports that he has not himself, he sometimes feel dizzy, and have unsteady gait. Hx of dementia. PT was here on Sunday Took all this BP meds this AM On arrival, patient is A&O X4, he states he is having trouble walking, states he has a walker but does not use it.

## 2019-07-08 NOTE — ED Notes (Signed)
ED TO INPATIENT HANDOFF REPORT  ED Nurse Name and Phone #: William Hamburger RN, Irene Name/Age/Gender [name] 73 y.o. male Room/Bed: 029C/029C  Code Status   Code Status: Prior  Home/SNF/Other Home Patient oriented to: self Is this baseline? No   Triage Complete: Triage complete  Chief Complaint Dizziness  Triage Note Patient arrived from home via [place]. Family reports that he has not himself, he sometimes feel dizzy, and have unsteady gait. Hx of dementia. PT was here on Sunday Took all this BP meds this AM On arrival, patient is A&O X4, he states he is having trouble walking, states he has a walker but does not use it.   Allergies Allergies  Allergen Reactions  . Levaquin [Levofloxacin In D5w] Other (See Comments)    Unknown rxn  . Lisinopril Swelling    Level of Care/Admitting Diagnosis ED Disposition    ED Disposition Condition [facility]: [place] [[id number]]  Level of Care: Telemetry Medical [104]  Covid Evaluation: Asymptomatic Screening Protocol (No Symptoms)  Diagnosis: Acute renal failure (ARF) ([place]) [[id number]]  Admitting Physician: Karmen Bongo [[id number]]  Attending Physician: Karmen Bongo [[id number]]  Estimated length of stay: 3 - 4 days  Certification:: I certify this patient will need inpatient services for at least 2 midnights  PT Class (Do Not Modify): Inpatient [101]  PT Acc Code (Do Not Modify): Private [1]       B Medical/Surgery History Past Medical History:  Diagnosis Date  . B12 deficiency   . CHF (congestive heart failure) ([place])   . COPD (chronic obstructive pulmonary disease) ([place])   . Diabetes mellitus without complication ([place])   . Folate deficiency   . Gout   . Hyperlipemia   . Hypertension   . 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 MI (myocardial infarction) ([place])   . Pulmonary hypertension ([place])    Past Surgical History:  Procedure Laterality Date  .  APPENDECTOMY    . BIOPSY  04/21/2019   Procedure: BIOPSY;  Surgeon: Gatha Mayer, MD;  Location: [facility];  Service: Endoscopy;;  . ESOPHAGOGASTRODUODENOSCOPY (EGD) WITH PROPOFOL N/A 04/21/2019   Procedure: ESOPHAGOGASTRODUODENOSCOPY (EGD) WITH PROPOFOL;  Surgeon: Gatha Mayer, MD;  Location: [facility];  Service: Endoscopy;  Laterality: N/A;  . ESOPHAGOGASTRODUODENOSCOPY (EGD) WITH PROPOFOL N/A 04/29/2019   Procedure: ESOPHAGOGASTRODUODENOSCOPY (EGD) WITH PROPOFOL;  Surgeon: Mauri Pole, MD;  Location: [facility];  Service: Endoscopy;  Laterality: N/A;  . HEMOSTASIS CLIP PLACEMENT  04/29/2019   Procedure: HEMOSTASIS CLIP PLACEMENT;  Surgeon: Mauri Pole, MD;  Location: [facility];  Service: Endoscopy;;  Clip placed as marker not for bleeding control  . IR ANGIOGRAM SELECTIVE EACH ADDITIONAL VESSEL  04/29/2019  . IR ANGIOGRAM SELECTIVE EACH ADDITIONAL VESSEL  04/29/2019  . IR ANGIOGRAM SELECTIVE EACH ADDITIONAL VESSEL  04/29/2019  . IR ANGIOGRAM VISCERAL SELECTIVE  04/29/2019  . IR EMBO ART  VEN HEMORR LYMPH EXTRAV  INC GUIDE ROADMAPPING  04/29/2019  . IR US GUIDE VASC ACCESS RIGHT  04/29/2019  . LUNG BIOPSY       A IV Location/Drains/Wounds Patient Lines/Drains/Airways Status   Active Line/Drains/Airways    Name:   Placement date:   Placement time:   Site:   Days:   Peripheral IV 07/08/19 Right Antecubital   07/08/19    1100    Antecubital   less than 1   Peripheral IV  07/08/19 Left Forearm   07/08/19    1228    Forearm   less than 1          Intake/Output Last 24 hours No intake or output data in the 24 hours ending 07/08/19 1602  Labs/Imaging Results for orders placed or performed during the hospital encounter of 07/08/19 (from the past 48 hour(s))  CBG monitoring, ED     Status: Abnormal   Collection Time: 07/08/19  9:59 AM  Result Value Ref Range   Glucose-Capillary 107 (H) 70 - 99 mg/dL   Comment 1 Notify RN    Comment 2 Document in Chart    Comprehensive metabolic panel     Status: Abnormal   Collection Time: 07/08/19 10:37 AM  Result Value Ref Range   Sodium 142 135 - 145 mmol/L   Potassium 2.2 (LL) 3.5 - 5.1 mmol/L    Comment: CRITICAL RESULT CALLED TO, READ BACK BY AND VERIFIED WITH: KIRK,K RN  @1121  ON [id number] BY FLEMINGS    Chloride 102 98 - 111 mmol/L   CO2 24 22 - 32 mmol/L   Glucose, Bld 108 (H) 70 - 99 mg/dL   BUN 38 (H) 8 - 23 mg/dL   Creatinine, Ser 4.54 (H) 0.61 - 1.24 mg/dL   Calcium 7.8 (L) 8.9 - 10.3 mg/dL   Total Protein 6.7 6.5 - 8.1 g/dL   Albumin 3.3 (L) 3.5 - 5.0 g/dL   AST 16 15 - 41 U/L   ALT 10 0 - 44 U/L   Alkaline Phosphatase 61 38 - 126 U/L   Total Bilirubin 1.3 (H) 0.3 - 1.2 mg/dL   GFR calc non Af Amer 12 (L) >60 mL/min   GFR calc Af Amer 14 (L) >60 mL/min   Anion gap 16 (H) 5 - 15    Comment: Performed at [facility], [address], [place], [place] [address]  Ethanol     Status: None   Collection Time: 07/08/19 10:37 AM  Result Value Ref Range   Alcohol, Ethyl (B) <10 <10 mg/dL    Comment: (NOTE) Lowest detectable limit for serum alcohol is 10 mg/dL. For medical purposes only. Performed at [facility], [place] [address] [place], [place] [address]   CBC with Differential     Status: Abnormal   Collection Time: 07/08/19 10:37 AM  Result Value Ref Range   WBC 23.5 (H) 4.0 - 10.5 K/uL   RBC 3.43 (L) 4.22 - 5.81 MIL/uL   Hemoglobin 10.4 (L) 13.0 - 17.0 g/dL   HCT 32.1 (L) 39.0 - 52.0 %   MCV 93.6 80.0 - 100.0 fL   MCH 30.3 26.0 - 34.0 pg   MCHC 32.4 30.0 - 36.0 g/dL   RDW 17.9 (H) 11.5 - 15.5 %   Platelets 198 150 - 400 K/uL   nRBC 0.0 0.0 - 0.2 %   Neutrophils Relative % 92 %   Neutro Abs 21.6 (H) 1.7 - 7.7 K/uL   Lymphocytes Relative 2 %   Lymphs Abs 0.4 (L) 0.7 - 4.0 K/uL   Monocytes Relative 5 %   Monocytes Absolute 1.2 (H) 0.1 - 1.0 K/uL   Eosinophils Relative 0 %   Eosinophils Absolute 0.0 0.0 - 0.5 K/uL   Basophils Relative 0 %   Basophils Absolute  0.0 0.0 - 0.1 K/uL   Immature Granulocytes 1 %   Abs Immature Granulocytes 0.29 (H) 0.00 - 0.07 K/uL    Comment: Performed at [facility], [address] [place], [place] [address]  Lipase, blood     Status: None   Collection Time: 07/08/19 10:37 AM  Result Value Ref Range   Lipase 19 11 - 51 U/L    Comment: Performed at [facility] [address] [place], [place] [address]  Salicylate level     Status: None   Collection Time: 07/08/19 10:37 AM  Result Value Ref Range   Salicylate Lvl <7.1 2.8 - 30.0 mg/dL    Comment: Performed at [facility] [address], [place], [place] [address]  Acetaminophen level     Status: Abnormal   Collection Time: 07/08/19 10:37 AM  Result Value Ref Range   Acetaminophen (Tylenol), Serum <10 (L) 10 - 30 ug/mL    Comment: (NOTE) Therapeutic concentrations vary significantly. A range of 10-30 ug/mL  may be an effective concentration for many patients. However, some  are best treated at concentrations outside of this range. Acetaminophen concentrations >150 ug/mL at 4 hours after ingestion  and >50 ug/mL at 12 hours after ingestion are often associated with  toxic reactions. Performed at [facility], [place] [address], [place], [place] [address]   Magnesium     Status: Abnormal   Collection Time: 07/08/19 10:37 AM  Result Value Ref Range   Magnesium 0.8 (LL) 1.7 - 2.4 mg/dL    Comment: CRITICAL RESULT CALLED TO, READ BACK BY AND VERIFIED WITH: KIRK,K RN @1209  ON [id number] BY FLEMINGS Performed at [facility], [place] [address] [place], [place] [address]   Urinalysis, Routine w reflex microscopic     Status: Abnormal   Collection Time: 07/08/19 10:38 AM  Result Value Ref Range   Color, Urine YELLOW YELLOW   APPearance HAZY (A) CLEAR   Specific Gravity, Urine 1.020 1.005 - 1.030   pH 5.0 5.0 - 8.0   Glucose, UA NEGATIVE NEGATIVE mg/dL   Hgb urine dipstick SMALL (A) NEGATIVE   Bilirubin Urine NEGATIVE NEGATIVE    Ketones, ur NEGATIVE NEGATIVE mg/dL   Protein, ur 30 (A) NEGATIVE mg/dL   Nitrite NEGATIVE NEGATIVE   Leukocytes,Ua NEGATIVE NEGATIVE   RBC / HPF 6-10 0 - 5 RBC/hpf   WBC, UA 6-10 0 - 5 WBC/hpf   Bacteria, UA RARE (A) NONE SEEN   Squamous Epithelial / LPF 0-5 0 - 5   Mucus PRESENT    Hyaline Casts, UA PRESENT     Comment: Performed at [facility], [place] [address] [place], [place] [address]  Lactic acid, plasma     Status: Abnormal   Collection Time: 07/08/19 10:53 AM  Result Value Ref Range   Lactic Acid, Venous 2.1 (HH) 0.5 - 1.9 mmol/L    Comment: CRITICAL RESULT CALLED TO, READ BACK BY AND VERIFIED WITH: KIRK,K RN @1154  ON [id number] BY FLEMINGS Performed at [facility] [address], [place], [place] [address]   SARS Coronavirus 2 (CEPHEID - Performed in [facility] hospital lab), Hosp Order     Status: None   Collection Time: 07/08/19 11:34 AM   Specimen: Nasopharyngeal Swab  Result Value Ref Range   SARS Coronavirus 2 NEGATIVE NEGATIVE    Comment: (NOTE) If result is NEGATIVE SARS-CoV-2 target nucleic acids are NOT DETECTED. The SARS-CoV-2 RNA is generally detectable in upper and lower  respiratory specimens during the acute phase of infection. The lowest  concentration of SARS-CoV-2 viral copies this assay can detect is 250  copies /  mL. A negative result does not preclude SARS-CoV-2 infection  and should not be used as the sole basis for treatment or other  patient management decisions.  A negative result may occur with  improper specimen collection / handling, submission of specimen other  than nasopharyngeal swab, presence of viral mutation(s) within the  areas targeted by this assay, and inadequate number of viral copies  (<250 copies / mL). A negative result must be combined with clinical  observations, patient history, and epidemiological information. If result is POSITIVE SARS-CoV-2 target nucleic acids are DETECTED. The SARS-CoV-2 RNA is generally  detectable in upper and lower  respiratory specimens dur ing the acute phase of infection.  Positive  results are indicative of active infection with SARS-CoV-2.  Clinical  correlation with patient history and other diagnostic information is  necessary to determine patient infection status.  Positive results do  not rule out bacterial infection or co-infection with other viruses. If result is PRESUMPTIVE POSTIVE SARS-CoV-2 nucleic acids MAY BE PRESENT.   A presumptive positive result was obtained on the submitted specimen  and confirmed on repeat testing.  While 2019 novel coronavirus  (SARS-CoV-2) nucleic acids may be present in the submitted sample  additional confirmatory testing may be necessary for epidemiological  and / or clinical management purposes  to differentiate between  SARS-CoV-2 and other Sarbecovirus currently known to infect humans.  If clinically indicated additional testing with an alternate test  methodology [phone]) is advised. The SARS-CoV-2 RNA is generally  detectable in upper and lower respiratory sp ecimens during the acute  phase of infection. The expected result is Negative. Fact Sheet for Patients:  StrictlyIdeas.no Fact Sheet for Healthcare Providers: BankingDealers.co.za This test is not yet approved or cleared by the [place] FDA and has been authorized for detection and/or diagnosis of SARS-CoV-2 by FDA under an Emergency Use Authorization (EUA).  This EUA will remain in effect (meaning this test can be used) for the duration of the COVID-19 declaration under Section 564(b)(1) of the Act, 21 U.S.C. section 360bbb-3(b)(1), unless the authorization is terminated or revoked sooner. Performed at [facility], [place] [address] [place], [place] [address]   APTT     Status: Abnormal   Collection Time: 07/08/19 12:20 PM  Result Value Ref Range   aPTT 46 (H) 24 - 36 seconds    Comment:        IF  BASELINE aPTT IS ELEVATED, SUGGEST PATIENT RISK ASSESSMENT BE USED TO DETERMINE APPROPRIATE ANTICOAGULANT THERAPY. Performed at [facility], [place] [address] [place], [place] [address]   Protime-INR     Status: Abnormal   Collection Time: 07/08/19 12:20 PM  Result Value Ref Range   Prothrombin Time 16.5 (H) 11.4 - 15.2 seconds   INR 1.4 (H) 0.8 - 1.2    Comment: (NOTE) INR goal varies based on device and disease states. Performed at [facility], [place] [address], [place], [place] [address]   Lactic acid, plasma     Status: Abnormal   Collection Time: 07/08/19 12:22 PM  Result Value Ref Range   Lactic Acid, Venous 2.1 (HH) 0.5 - 1.9 mmol/L    Comment: CRITICAL RESULT CALLED TO, READ BACK BY AND VERIFIED WITH: KIRK,K RN @1325  ON [id number] BY FLEMINGS Performed at [facility] [address], [place], [place] [address]    Dg Chest Port 1 View  Result Date: 07/08/2019 CLINICAL DATA:  Shortness of breath and fever. EXAM: PORTABLE CHEST  1 VIEW COMPARISON:  PET-CT May 26, 2019 and chest radiograph April 28, 2019 FINDINGS: The previously noted right lower lobe mass is again apparent, measuring 5.1 x 3.8 cm. There is no appreciable edema or consolidation. Heart is mildly enlarged with pulmonary vascularity normal. No adenopathy evident. No bone lesions. IMPRESSION: Persistent right lower lobe mass. No new opacity evident. Heart mildly enlarged. No adenopathy demonstrable by radiography. Electronically Signed   By: Lowella Grip III M.D.   On: 07/08/2019 11:33    Pending Labs Unresulted Labs (From admission, onward)    Start     Ordered   07/08/19 5176  Basic metabolic panel  ONCE - STAT,   STAT     07/08/19 1441   07/08/19 1053  Culture, blood (routine x 2)  BLOOD CULTURE X 2,   STAT     07/08/19 1052   07/08/19 1038  Urine culture  ONCE - STAT,   STAT     07/08/19 1037          Vitals/Pain Today's Vitals   07/08/19 1445 07/08/19 1500 07/08/19 1515 07/08/19  1550  BP: 100/79 (!) 98/57 100/63 (!) 120/55  Pulse: 72 72 70 76  Resp:    (!) 22  Temp:      TempSrc:      SpO2: 91% 92% 91% 93%  Weight:      Height:        Isolation Precautions No active isolations  Medications Medications  potassium chloride 10 mEq in 100 mL IVPB (10 mEq Intravenous New Bag/Given 07/08/19 1504)  sodium chloride 0.9 % bolus 500 mL (0 mLs Intravenous Stopped 07/08/19 1258)  sodium chloride 0.9 % bolus 1,000 mL (0 mLs Intravenous Stopped 07/08/19 1500)  sodium chloride 0.9 % bolus 500 mL (0 mLs Intravenous Stopped 07/08/19 1258)  potassium chloride SA (K-DUR) CR tablet 40 mEq (40 mEq Oral Given 07/08/19 1204)  ceFEPIme (MAXIPIME) 2 g in sodium chloride 0.9 % 100 mL IVPB (0 g Intravenous Stopped 07/08/19 1254)  metroNIDAZOLE (FLAGYL) IVPB 500 mg (0 mg Intravenous Stopped 07/08/19 1310)  vancomycin (VANCOCIN) 1,500 mg in sodium chloride 0.9 % 500 mL IVPB (0 mg Intravenous Stopped 07/08/19 1503)  magnesium sulfate IVPB 2 g 50 mL (0 g Intravenous Stopped 07/08/19 1459)    Mobility walks with person assist     Focused Assessments Neuro Assessment Handoff:  Swallow screen pass? No    NIH Stroke Scale ( + Modified Stroke Scale Criteria)  LOC Questions (1b. )   +: Answers both questions correctly LOC Commands (1c. )   + : Performs both tasks correctly Best Gaze (2. )  +: Normal Visual (3. )  +: No visual loss Motor Arm, Left (5a. )   +: No drift Motor Arm, Right (5b. )   +: No drift Motor Leg, Left (6a. )   +: No drift Motor Leg, Right (6b. )   +: No drift Sensory (8. )   +: Normal, no sensory loss Best Language (9. )   +: No aphasia Extinction/Inattention (11.)   +: No Abnormality Modified SS Total  +: 0     Neuro Assessment: Within Defined Limits Neuro Checks:      Last Documented NIHSS Modified Score: 0 (07/08/19 1030) Has TPA been given? No If patient is a Neuro Trauma and patient is going to OR before floor call report to [facility] nurse: [phone] or  [phone]     R Recommendations: See Admitting Provider Note  Report  given to:   Additional Notes: Call if you have ??s

## 2019-07-08 NOTE — Progress Notes (Signed)
Patient refusing care and is clearly confused, patient reoriented and educated on plan of care. Spoke with daughter, John [name], and patient now agrees to let us care for him.  BP low, provider paged.

## 2019-07-08 NOTE — Plan of Care (Signed)
  Problem: Education: Goal: Knowledge of General Education information will improve Description Including pain rating scale, medication(s)/side effects and non-pharmacologic comfort measures Outcome: Progressing   Problem: Health Behavior/Discharge Planning: Goal: Ability to manage health-related needs will improve Outcome: Progressing   

## 2019-07-08 NOTE — Progress Notes (Signed)
   Vital Signs MEWS/VS Documentation      07/08/2019 1951 07/08/2019 2117 07/08/2019 2120 07/08/2019 2125   MEWS Score:  -  1  1  2    MEWS Score Color:  -  Green  Green  Yellow   Resp:  -  18  -  -   Pulse:  -  92  -  -   BP:  -  (!) 87/52  (!) 81/52  (!) 80/48   Temp:  -  98.5 F (36.9 C)  -  -   O2 Device:  Room Air  Nasal Cannula  -  -   O2 Flow Rate (L/min):  -  2 L/min  -  -           John [name] 07/08/2019,10:43 PM

## 2019-07-08 NOTE — H&P (Signed)
History and Physical    John [name] MVE:[id number] DOB: 10/26/1946 DOA: 07/08/2019  PCP: [name], NP Consultants:  Julien Nordmann - oncology Patient coming from:  Home - lives with daughter; NOK: Daughter  Chief Complaint: AMS  HPI: John [name] is a 73 y.o. male with medical history significant of pulmonary HTN; CAD; SCC of RLL; HTN; HLD; dementia; DM; COPD; and CHF presenting with AMS.  He reports that he got dizzy and fell out today.  He has not been eating/drinking for the last few days.  He has had an upset stomach without n/v.  No cough.  No fever.  Some SOB.  No COVID contacts.  ED Course:  A "conundrum."  Appears septic today.  Brought in for weakness, increased confusion.  Seen in the ER on 7/5 and "off" since.  BPs as low as 82 systolic, WBC 28.3. h/o lung CA and concern for recurrent mass.  K+ 2.2, Mag++ 0.8.  Last creatinine was 1.8 in may, 4.54 today.  No source of infection but started on broad-spectrum antibiotics.  Normal LFTs but INR 1.4.  COVID negative.  He was given 30 cc/kg IVF bolus.  Review of Systems: As per HPI; otherwise review of systems reviewed and negative.   Ambulatory Status:  Ambulates without assistance or with a cane  Past Medical History:  Diagnosis Date  . B12 deficiency   . CHF (congestive heart failure) ([place])   . COPD (chronic obstructive pulmonary disease) ([place])   . Diabetes mellitus without complication ([place])   . Folate deficiency   . Gout   . Hyperlipemia   . Hypertension   . 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 MI (myocardial infarction) ([place])   . Pulmonary hypertension ([place])     Past Surgical History:  Procedure Laterality Date  . APPENDECTOMY    . BIOPSY  04/21/2019   Procedure: BIOPSY;  Surgeon: Gatha Mayer, MD;  Location: [facility];  Service: Endoscopy;;  . ESOPHAGOGASTRODUODENOSCOPY (EGD) WITH PROPOFOL N/A 04/21/2019   Procedure: ESOPHAGOGASTRODUODENOSCOPY (EGD) WITH PROPOFOL;   Surgeon: Gatha Mayer, MD;  Location: [facility];  Service: Endoscopy;  Laterality: N/A;  . ESOPHAGOGASTRODUODENOSCOPY (EGD) WITH PROPOFOL N/A 04/29/2019   Procedure: ESOPHAGOGASTRODUODENOSCOPY (EGD) WITH PROPOFOL;  Surgeon: Mauri Pole, MD;  Location: [facility];  Service: Endoscopy;  Laterality: N/A;  . HEMOSTASIS CLIP PLACEMENT  04/29/2019   Procedure: HEMOSTASIS CLIP PLACEMENT;  Surgeon: Mauri Pole, MD;  Location: [facility];  Service: Endoscopy;;  Clip placed as marker not for bleeding control  . IR ANGIOGRAM SELECTIVE EACH ADDITIONAL VESSEL  04/29/2019  . IR ANGIOGRAM SELECTIVE EACH ADDITIONAL VESSEL  04/29/2019  . IR ANGIOGRAM SELECTIVE EACH ADDITIONAL VESSEL  04/29/2019  . IR ANGIOGRAM VISCERAL SELECTIVE  04/29/2019  . IR EMBO ART  VEN HEMORR LYMPH EXTRAV  INC GUIDE ROADMAPPING  04/29/2019  . IR US GUIDE VASC ACCESS RIGHT  04/29/2019  . LUNG BIOPSY      Social History   Socioeconomic History  . Marital status: Legally Separated    Spouse name: Not on file  . Number of children: 2  . Years of education: Not on file  . Highest education level: Not on file  Occupational History  . Occupation: retired  Scientific laboratory technician  . Financial resource strain: Not on file  . Food insecurity    Worry: Not on file    Inability: Not on file  . Transportation needs  Medical: Not on file    Non-medical: Not on file  Tobacco Use  . Smoking status: Former Smoker    Packs/day: 1.50    Years: 60.00    Pack years: 90.00    Types: Cigarettes    Quit date: 09/29/2012    Years since quitting: 6.7  . Smokeless tobacco: Never Used  Substance and Sexual Activity  . Alcohol use: No    Alcohol/week: 0.0 standard drinks  . Drug use: No  . Sexual activity: Yes  Lifestyle  . Physical activity    Days per week: Not on file    Minutes per session: Not on file  . Stress: Not on file  Relationships  . Social Herbalist on phone: Not on file    Gets together: Not on file     Attends religious service: Not on file    Active member of club or organization: Not on file    Attends meetings of clubs or organizations: Not on file    Relationship status: Not on file  . Intimate partner violence    Fear of current or ex partner: Not on file    Emotionally abused: Not on file    Physically abused: Not on file    Forced sexual activity: Not on file  Other Topics Concern  . Not on file  Social History Narrative   Separated - 1 daughter and 1 son   Retired Horticulturist, commercial   Former smoker, no EtOH    Allergies  Allergen Reactions  . Levaquin [Levofloxacin In D5w] Other (See Comments)    Unknown rxn  . Lisinopril Swelling    Family History  Problem Relation Age of Onset  . Heart disease Mother   . Cancer Neg Hx     Prior to Admission medications   Medication Sig Start Date End Date Taking? Authorizing Provider  albuterol (PROAIR HFA) 108 (90 BASE) MCG/ACT inhaler INHALE 2 PUFFS BY MOUTH EVERY 4 HOURS AS NEEDED FOR WHEEZING Patient taking differently: Inhale 2 puffs into the lungs every 4 (four) hours as needed for wheezing.  03/13/15  Yes Tanda Rockers, MD  allopurinol (ZYLOPRIM) 300 MG tablet Take 300 mg by mouth daily. 04/07/19  Yes [provider]  amLODipine (NORVASC) 10 MG tablet Take 10 mg by mouth daily. 05/07/19  Yes [provider]  atorvastatin (LIPITOR) 20 MG tablet Take 1 tablet (20 mg total) by mouth daily. 09/26/16  Yes Tanda Rockers, MD  bisoprolol (ZEBETA) 5 MG tablet Take 1 tablet (5 mg total) by mouth daily. 09/26/16  Yes Tanda Rockers, MD  BREO ELLIPTA 100-25 MCG/INH AEPB Inhale 1 puff into the lungs 2 (two) times a day.  05/07/19  Yes [provider]  budesonide-formoterol (SYMBICORT) 160-4.5 MCG/ACT inhaler INHALE 2 PUFFS BY MOUTH EVERY 12 HOURS (FIRST THING IN THE MORNING THEN 12 HOURS LATER) Patient taking differently: Inhale 2 puffs into the lungs every 12 (twelve) hours.  03/24/17  Yes Tanda Rockers, MD   Cholecalciferol (VITAMIN D3) 50 MCG (2000 UT) capsule Take 2,000 Units by mouth daily.  05/07/19  Yes [provider]  cloNIDine (CATAPRES) 0.1 MG tablet Take 0.1 mg by mouth 2 (two) times daily. 05/07/19  Yes [provider]  ferrous sulfate 325 (65 FE) MG tablet Take 1 tablet (325 mg total) by mouth every Monday, Wednesday, and Friday. 05/03/19 08/01/19 Yes Florencia Reasons, MD  folic acid (FOLVITE) 1 MG tablet Take 1 tablet (1 mg  total) by mouth daily. 04/09/19 04/08/20 Yes Donne Hazel, MD  furosemide (LASIX) 20 MG tablet Take 1 tablet (20 mg total) by mouth every Monday, Wednesday, and Friday. 05/03/19  Yes Florencia Reasons, MD  metFORMIN (GLUCOPHAGE) 1000 MG tablet Take 0.5 tablets (500 mg total) by mouth 2 (two) times daily. 05/01/19  Yes Florencia Reasons, MD  naproxen (NAPROSYN) 500 MG tablet Take 500 mg by mouth 2 (two) times daily as needed (pain).  05/07/19  Yes [provider]  pantoprazole (PROTONIX) 40 MG tablet Take 1 tablet (40 mg total) by mouth 2 (two) times daily before a meal. 05/28/19  Yes Nandigam, Kavitha V, MD  senna-docusate (SENOKOT-S) 8.6-50 MG tablet Take 1 tablet by mouth at bedtime. 05/01/19  Yes Florencia Reasons, MD  spironolactone (ALDACTONE) 25 MG tablet Take 12.5 mg by mouth daily. 05/07/19  Yes [provider]  sucralfate (CARAFATE) 1 g tablet Take 1 g by mouth See admin instructions. Four time daily and at bedtime 06/23/19  Yes [provider]  Vitamin D, Ergocalciferol, (DRISDOL) 1.25 MG (50000 UT) CAPS capsule Take 50,000 Units by mouth every Friday.  03/10/19  Yes [provider]    Physical Exam: Vitals:   07/08/19 1500 07/08/19 1515 07/08/19 1550 07/08/19 1654  BP: (!) 98/57 100/63 (!) 120/55 (!) 156/139  Pulse: 72 70 76 80  Resp:   (!) 22 (!) 22  Temp:    (!) 97.4 F (36.3 C)  TempSrc:    Oral  SpO2: 92% 91% 93% 93%  Weight:    84.4 kg  Height:    5\' 7"  (1.702 m)     . General:  Appears calm and comfortable and is NAD . Eyes:  PERRL, EOMI,  normal lids, iris . ENT:  grossly normal hearing, lips & tongue, mildly dry mm; poor/mostly absent dentition . Neck:  no LAD, masses or thyromegaly . Cardiovascular:  RRR, no m/r/g. No LE edema.  Marland Kitchen Respiratory:   CTA bilaterally with no wheezes/rales/rhonchi.  Mildly increased respiratory effort. . Abdomen:  soft, NT, ND, NABS . Skin:  no rash or induration seen on limited exam . Musculoskeletal:  grossly normal tone BUE/BLE, good ROM, no bony abnormality . Psychiatric:  grossly normal mood and affect, speech fluent and appropriate, AOx3 . Neurologic:  CN 2-12 grossly intact, moves all extremities in coordinated fashion, sensation intact    Radiological Exams on Admission: Dg Chest Port 1 View  Result Date: 07/08/2019 CLINICAL DATA:  Shortness of breath and fever. EXAM: PORTABLE CHEST 1 VIEW COMPARISON:  PET-CT May 26, 2019 and chest radiograph April 28, 2019 FINDINGS: The previously noted right lower lobe mass is again apparent, measuring 5.1 x 3.8 cm. There is no appreciable edema or consolidation. Heart is mildly enlarged with pulmonary vascularity normal. No adenopathy evident. No bone lesions. IMPRESSION: Persistent right lower lobe mass. No new opacity evident. Heart mildly enlarged. No adenopathy demonstrable by radiography. Electronically Signed   By: Lowella Grip III M.D.   On: 07/08/2019 11:33    EKG: Independently reviewed.  NSR with rate 66; nonspecific ST changes with no evidence of acute ischemia; NSCSLT   Labs on Admission: I have personally reviewed the available labs and imaging studies at the time of the admission.  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   -Today's creatinine is 4.54 -Likely due to prerenal failure secondary to dehydration and continuation of diuretics along with Metformin and possibly NSAIDs (has Naprosyn prescribed). -Interestingly, he was seen for a fall in the ER on 7/6 and labs were not checked; possibly he was already starting to have mild orthostasis -Hold aldactone for now -IVF with 75 cc LR/hour -US-renal -Follow up renal function by BMP -Avoid ACEI and NSAIDs -No current evidence of or concern for sepsis; mildly elevated but stable lactate is thought to be related to dehydration, and will trend -Will admit for IVF and ongoing evaluation and treatment  Hypokalemia -Repleted in ER with 30 mEq IV KCl and 40 mEq PO; this would be anticipated to increase K+ to 2.9 -Will add an additional 40 mEq PO x 1 at 2200 -Markedly low Mag level in the ER as well; he was given 2 gram load in the ER  -Will check BMP and Mag level in AM. -Will monitor on telemetry due to risk of arrhythmias associated with severe electrolyte abnormalities  Hypotension with h/o HTN -Thought to be associated with severe volume depletion -His BP has improved with IVF -Will resume Clonidine tonight to prevent rebound HTN -Will resume Bisoprolol tomorrow AM to prevent rebound tachycardia -Continue to hold Norvasc for now  COPD/RLL lung CA -Has not yet started with radiation therapy due to COVID and scheduling issues -Appt scheduled for 7/16 -Apparently wears home O2 with exertion and qhs -Mild SOB at the time of my evaluation  Chronic diastolic CHF  -Normal EF on echo in 12/15 with "mildly decreased RV systolic function" -Will need ongoing monitoring given his IVF repletion -Continue Albuterol prn -Continue O2 prn -Continue Breo ellipta, Symbicort  Dementia -Appears to be compensated at this time and was A&O x 3 -Monitor for hospital-associated delirium   Note: This patient has been tested and is negative for the novel coronavirus COVID-19.  DVT prophylaxis:  Heparin Code Status:  Full - confirmed with patient Family Communication: None present; patient declined having me contact his family Disposition Plan:  Home once clinically improved Consults called: None  Admission status: Admit - It is my clinical opinion that admission to INPATIENT is reasonable and necessary because of the expectation that this patient will require hospital care that crosses at least 2 midnights to treat this condition based on the medical complexity of the problems presented.  Given the aforementioned information, the predictability of an adverse outcome is felt to be significant.    Karmen Bongo MD [facility]   How to contact the [facility] Attending or Consulting provider [place] or covering provider during after hours [place], for this patient?  1. Check the care team in [facility] and look for a) attending/consulting [facility] provider listed and b) the [facility] team listed 2. Log into [email or web address] and use [facility]'s universal password to access. If you do not have the password, please contact the hospital operator.  3. Locate the [facility] provider you are looking for under [facility] and page to a number that you can be directly reached. 4. If you still have difficulty reaching the provider, please page the [place] (Director on Call) for the Hospitalists listed on amion for assistance.   07/08/2019, 6:35 PM

## 2019-07-08 NOTE — ED Notes (Signed)
Pt reported dizziness while standing during orthostatics.

## 2019-07-08 NOTE — Progress Notes (Signed)
Patient ID: John [name], male   DOB: 1946/08/28, 73 y.o.   MRN: [id number]   Pt was scheduled for OP lung biopsy today  Now in ED: found down in home Unwitnessed fall Work up by ED MD  Will HOLD lung biopsy now  IR will follow chart- admission  Bx can be rescheduled as OP (preferred) Or IP if MD feels necessary

## 2019-07-09 ENCOUNTER — Ambulatory Visit: Payer: Medicare Other | Admitting: Neurology

## 2019-07-09 DIAGNOSIS — I9589 Other hypotension: Secondary | ICD-10-CM

## 2019-07-09 DIAGNOSIS — E861 Hypovolemia: Secondary | ICD-10-CM

## 2019-07-09 DIAGNOSIS — F0151 Vascular dementia with behavioral disturbance: Secondary | ICD-10-CM

## 2019-07-09 DIAGNOSIS — N179 Acute kidney failure, unspecified: Principal | ICD-10-CM

## 2019-07-09 LAB — BLOOD CULTURE ID PANEL (REFLEXED)

## 2019-07-09 LAB — PROTIME-INR
INR: 1.4 — ABNORMAL HIGH (ref 0.8–1.2)
Prothrombin Time: 17 seconds — ABNORMAL HIGH (ref 11.4–15.2)

## 2019-07-09 LAB — GLUCOSE, CAPILLARY
Glucose-Capillary: 105 mg/dL — ABNORMAL HIGH (ref 70–99)
Glucose-Capillary: 150 mg/dL — ABNORMAL HIGH (ref 70–99)
Glucose-Capillary: 152 mg/dL — ABNORMAL HIGH (ref 70–99)
Glucose-Capillary: 127 mg/dL — ABNORMAL HIGH (ref 70–99)

## 2019-07-09 LAB — MAGNESIUM
Magnesium: 1.1 mg/dL — ABNORMAL LOW (ref 1.7–2.4)
Magnesium: 1.6 mg/dL — ABNORMAL LOW (ref 1.7–2.4)

## 2019-07-09 LAB — CBC
HCT: 27.8 % — ABNORMAL LOW (ref 39.0–52.0)
Hemoglobin: 9.1 g/dL — ABNORMAL LOW (ref 13.0–17.0)
MCH: 30.2 pg (ref 26.0–34.0)
MCHC: 32.7 g/dL (ref 30.0–36.0)
MCV: 92.4 fL (ref 80.0–100.0)
Platelets: 181 10*3/uL (ref 150–400)
RBC: 3.01 MIL/uL — ABNORMAL LOW (ref 4.22–5.81)
RDW: 17.9 % — ABNORMAL HIGH (ref 11.5–15.5)
WBC: 24.2 10*3/uL — ABNORMAL HIGH (ref 4.0–10.5)
nRBC: 0 % (ref 0.0–0.2)

## 2019-07-09 LAB — BASIC METABOLIC PANEL
Anion gap: 13 (ref 5–15)
BUN: 40 mg/dL — ABNORMAL HIGH (ref 8–23)
CO2: 20 mmol/L — ABNORMAL LOW (ref 22–32)
Calcium: 7.2 mg/dL — ABNORMAL LOW (ref 8.9–10.3)
Chloride: 109 mmol/L (ref 98–111)
Creatinine, Ser: 4.04 mg/dL — ABNORMAL HIGH (ref 0.61–1.24)
GFR calc Af Amer: 16 mL/min — ABNORMAL LOW (ref >60)
GFR calc non Af Amer: 14 mL/min — ABNORMAL LOW (ref >60)
Glucose, Bld: 92 mg/dL (ref 70–99)
Potassium: 2.3 mmol/L — CL (ref 3.5–5.1)
Sodium: 142 mmol/L (ref 135–145)

## 2019-07-09 LAB — URINE CULTURE: Culture: NO GROWTH

## 2019-07-09 LAB — POTASSIUM
Potassium: 3.5 mmol/L (ref 3.5–5.1)
Potassium: 3.2 mmol/L — ABNORMAL LOW (ref 3.5–5.1)

## 2019-07-09 LAB — LACTIC ACID, PLASMA: Lactic Acid, Venous: 2.9 mmol/L (ref 0.5–1.9)

## 2019-07-09 MED ORDER — POTASSIUM CHLORIDE CRYS ER 20 MEQ PO TBCR
40.0000 meq | EXTENDED_RELEASE_TABLET | Freq: Once | ORAL | Status: AC
  Administered 2019-07-09: 40 meq via ORAL
  Filled 2019-07-09: qty 2

## 2019-07-09 MED ORDER — QUETIAPINE FUMARATE 25 MG PO TABS: 25.0000 mg | ORAL_TABLET | Freq: Every day | ORAL | Status: DC

## 2019-07-09 MED ORDER — POTASSIUM CHLORIDE 10 MEQ/100ML IV SOLN
10.0000 meq | INTRAVENOUS | Status: AC
  Administered 2019-07-09 (×3): 10 meq via INTRAVENOUS
  Filled 2019-07-09 (×3): qty 100

## 2019-07-09 MED ORDER — BENZTROPINE MESYLATE 1 MG/ML IJ SOLN
1.0000 mg | Freq: Once | INTRAMUSCULAR | Status: DC
  Filled 2019-07-09: qty 1

## 2019-07-09 MED ORDER — HALOPERIDOL LACTATE 5 MG/ML IJ SOLN
2.0000 mg | Freq: Once | INTRAMUSCULAR | Status: AC
  Administered 2019-07-09: 2 mg via INTRAVENOUS
  Filled 2019-07-09: qty 1

## 2019-07-09 MED ORDER — POTASSIUM CHLORIDE 10 MEQ/100ML IV SOLN
10.0000 meq | INTRAVENOUS | Status: AC
  Filled 2019-07-09: qty 100

## 2019-07-09 MED ORDER — QUETIAPINE FUMARATE 25 MG PO TABS
25.0000 mg | ORAL_TABLET | Freq: Every day | ORAL | Status: DC
  Administered 2019-07-09 – 2019-07-10 (×2): 25 mg via ORAL
  Filled 2019-07-09 (×2): qty 1

## 2019-07-09 MED ORDER — QUETIAPINE FUMARATE 25 MG PO TABS
25.0000 mg | ORAL_TABLET | Freq: Every day | ORAL | Status: DC
  Administered 2019-07-09: 25 mg via ORAL
  Filled 2019-07-09: qty 1

## 2019-07-09 MED ORDER — MAGNESIUM SULFATE 2 GM/50ML IV SOLN
2.0000 g | Freq: Once | INTRAVENOUS | Status: AC
  Administered 2019-07-09: 2 g via INTRAVENOUS
  Filled 2019-07-09: qty 50

## 2019-07-09 NOTE — Progress Notes (Signed)
Pt refusing to be put on tele monitor, refusing IV fluids. MD aware.

## 2019-07-09 NOTE — Progress Notes (Signed)
Patient talked to daughter and RN updated. Patient trying to leave, RN calmed patient down.

## 2019-07-09 NOTE — Progress Notes (Signed)
PHARMACY - PHYSICIAN COMMUNICATION CRITICAL VALUE ALERT - BLOOD CULTURE IDENTIFICATION (BCID)  John [name] is an 73 y.o. male who presented to [facility] on 07/08/2019 with a chief complaint of altered mental status and electrolyte abnormalities  Assessment:  Patient had positive BC form anaerobic bottle (1/4). Gram stain showing gram variable rods, source unclear  Name of physician (or Provider) Contacted: Dr. Cordelia Poche  Current antibiotics: No antibiotics  Changes to prescribed antibiotics recommended:  Patient received one dose of cefepime, vancomycin, and metronidazole on admission. Biofire was negative, suspect contamination. Recommend to continue to watch off antibiotics at this time  Results for orders placed or performed during the hospital encounter of 07/08/19  Blood Culture ID Panel (Reflexed) (Collected: 07/08/2019 10:53 AM)  Result Value Ref Range   Enterococcus species NOT DETECTED NOT DETECTED   Listeria monocytogenes NOT DETECTED NOT DETECTED   Staphylococcus species NOT DETECTED NOT DETECTED   Staphylococcus aureus (BCID) NOT DETECTED NOT DETECTED   Streptococcus species NOT DETECTED NOT DETECTED   Streptococcus agalactiae NOT DETECTED NOT DETECTED   Streptococcus pneumoniae NOT DETECTED NOT DETECTED   Streptococcus pyogenes NOT DETECTED NOT DETECTED   Acinetobacter baumannii NOT DETECTED NOT DETECTED   Enterobacteriaceae species NOT DETECTED NOT DETECTED   Enterobacter cloacae complex NOT DETECTED NOT DETECTED   Escherichia coli NOT DETECTED NOT DETECTED   Klebsiella oxytoca NOT DETECTED NOT DETECTED   Klebsiella pneumoniae NOT DETECTED NOT DETECTED   Proteus species NOT DETECTED NOT DETECTED   Serratia marcescens NOT DETECTED NOT DETECTED   Haemophilus influenzae NOT DETECTED NOT DETECTED   Neisseria meningitidis NOT DETECTED NOT DETECTED   Pseudomonas aeruginosa NOT DETECTED NOT DETECTED   Candida albicans NOT DETECTED NOT DETECTED   Candida glabrata NOT  DETECTED NOT DETECTED   Candida krusei NOT DETECTED NOT DETECTED   Candida parapsilosis NOT DETECTED NOT DETECTED   Candida tropicalis NOT DETECTED NOT DETECTED    Phillis Haggis 07/09/2019  8:24 AM

## 2019-07-09 NOTE — Progress Notes (Signed)
PROGRESS NOTE    John [name]  NAT:[id number] DOB: November 25, 1946 DOA: 07/08/2019 PCP: [name], NP   Brief Narrative: John [name] is a 73 y.o. 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  Patient presented secondary to altered mental status and found to have hypotension, acute kidney injury, electrolyte abnormalities.   Assessment & Plan:   Principal Problem:   Acute renal failure (ARF) (HCC) Active Problems:   Chronic diastolic CHF (congestive heart failure) (HCC)   HTN (hypertension)   COPD II/III with reversibility    Primary cancer of right lower lobe of lung (HCC)   CKD (chronic kidney disease), stage III (HCC)   Hypotension   Vascular dementia without behavioral disturbance (HCC)   Hypokalemia   AKI on CKD stage III Baseline creatinine of about 1. Worsening of creatinine up to 4.54 on admission and trending down slowly.  Likely secondary to hypovolemia of unknown cause. -Continue IV fluids -Daily BMP  Hypotension Improved from last night. Still with some soft BPs -Discontinue bisoprolol and clonidine  Leukocytosis Unknown etiology.  No obvious source for infection.  Patient did arrive with hypotension.  Sepsis mentioned in H&P, however no evidence of sepsis from my assessment.  Patient does have blood cultures obtained with results of 104 bottles significant for gram variable rod which is concerning for contamination.  Urine culture with no growth. -Follow blood cultures -Daily CBC  Confusion Associated agitation and a history of dementia.  Likely delirium.  Patient currently does not have capacity and therefore will need to be IVCd if does not cooperate with treatment.  Social worker is aware.  His episodes appear to wax and wane. -Continue to monitor -We will give a dose of Haldol IV and start patient  on Seroquel PO  Hypokalemia Hypomagnesemia -Replete as needed with IV potassium and IV magnesium -Telemetry  Diabetes mellitus -Continue SSI  Chronic diastolic heart failure Currently compensated  COPD Right lower lobe lung carcinoma Patient is on chronic oxygen per chart review.  For cancer, appears patient was scheduled for biopsy today.  Patient will need to follow-up as an outpatient. -Continue Breo Ellipta, Symbicort, O2 as needed  Dementia Patient appears to be suffering from hospital associated delirium.  Management above.   DVT prophylaxis: Heparin subcutaneous Code Status:   Code Status: Full Code Family Communication: Daughter on telephone Disposition Plan: Discharge pending continued medical management   Consultants:   None  Procedures:   None  Antimicrobials:  Vancomycin (7/9)  Cefepime (7/9)   Subjective: Patient initially adamant about discharge.  On my second attempt visiting patient, patient with no significant concerns other than his IV site burning secondary to potassium.  Objective: Vitals:   07/08/19 2243 07/09/19 0025 07/09/19 0512 07/09/19 0600  BP: (!) 82/54 (!) 95/54 108/60   Pulse:  87 90   Resp:  20 20   Temp:  97.8 F (36.6 C) (!) 97.5 F (36.4 C)   TempSrc:  Oral Oral   SpO2:  94% 99%   Weight:    85.2 kg  Height:        Intake/Output Summary (Last 24 hours) at 07/09/2019 0719 Last data filed at 07/09/2019 0645 Gross per 24 hour  Intake 1294.5 ml  Output --  Net 1294.5 ml   Filed Weights   07/08/19 1000 07/08/19 1654 07/09/19 0600  Weight: 87 kg 84.4 kg 85.2 kg  Examination:  General exam: Initially very agitated and confrontational. Respiratory system: Clear to auscultation. Respiratory effort normal. Cardiovascular system: S1 & S2 heard, RRR. No murmurs, rubs, gallops or clicks. Gastrointestinal system: Abdomen is nondistended, soft and nontender. No organomegaly or masses felt. Normal bowel sounds  heard. Central nervous system: Alert and oriented to person and that he is in the hospital. No focal neurological deficits. Extremities: No edema. No calf tenderness Skin: No cyanosis. No rashes Psychiatry: Judgement and insight appear impaired.  No capacity.  Agitated mood.    Data Reviewed: I have personally reviewed following labs and imaging studies  CBC: Recent Labs  Lab 07/08/19 1037 07/09/19 0117  WBC 23.5* 24.2*  NEUTROABS 21.6*  --   HGB 10.4* 9.1*  HCT 32.1* 27.8*  MCV 93.6 92.4  PLT 198 132   Basic Metabolic Panel: Recent Labs  Lab 07/08/19 1037 07/08/19 1849 07/09/19 0117  NA 142 143 142  K 2.2* 2.8* 2.3*  CL 102 108 109  CO2 24 18* 20*  GLUCOSE 108* 86 92  BUN 38* 40* 40*  CREATININE 4.54* 4.18* 4.04*  CALCIUM 7.8* 7.2* 7.2*  MG 0.8*  --  1.1*   GFR: Estimated Creatinine Clearance: 16.7 mL/min (A) (by C-G formula based on SCr of 4.04 mg/dL (H)). Liver Function Tests: Recent Labs  Lab 07/08/19 1037  AST 16  ALT 10  ALKPHOS 61  BILITOT 1.3*  PROT 6.7  ALBUMIN 3.3*   Recent Labs  Lab 07/08/19 1037  LIPASE 19   No results for input(s): AMMONIA in the last 168 hours. Coagulation Profile: Recent Labs  Lab 07/08/19 1220 07/09/19 0117  INR 1.4* 1.4*   Cardiac Enzymes: No results for input(s): CKTOTAL, CKMB, CKMBINDEX, TROPONINI in the last 168 hours. BNP (last 3 results) No results for input(s): PROBNP in the last 8760 hours. HbA1C: No results for input(s): HGBA1C in the last 72 hours. CBG: Recent Labs  Lab 07/08/19 0959 07/08/19 2144 07/09/19 0612  GLUCAP 107* 73 105*   Lipid Profile: No results for input(s): CHOL, HDL, LDLCALC, TRIG, CHOLHDL, LDLDIRECT in the last 72 hours. Thyroid Function Tests: No results for input(s): TSH, T4TOTAL, FREET4, T3FREE, THYROIDAB in the last 72 hours. Anemia Panel: No results for input(s): VITAMINB12, FOLATE, FERRITIN, TIBC, IRON, RETICCTPCT in the last 72 hours. Sepsis Labs: Recent Labs  Lab  07/08/19 1053 07/08/19 1222 07/08/19 1906 07/09/19 0117  LATICACIDVEN 2.1* 2.1* 3.9* 2.9*    Recent Results (from the past 240 hour(s))  SARS Coronavirus 2 (Performed in [facility] hospital lab)     Status: None   Collection Time: 07/05/19  3:50 PM  Result Value Ref Range Status   SARS Coronavirus 2 NEGATIVE NEGATIVE Final    Comment: (NOTE) SARS-CoV-2 target nucleic acids are NOT DETECTED. The SARS-CoV-2 RNA is generally detectable in upper and lower respiratory specimens during the acute phase of infection. Negative results do not preclude SARS-CoV-2 infection, do not rule out co-infections with other pathogens, and should not be used as the sole basis for treatment or other patient management decisions. Negative results must be combined with clinical observations, patient history, and epidemiological information. The expected result is Negative. Fact Sheet for Patients: SugarRoll.be Fact Sheet for Healthcare Providers: https://www.woods-mathews.com/ This test is not yet approved or cleared by the [place] FDA and  has been authorized for detection and/or diagnosis of SARS-CoV-2 by FDA under an Emergency Use Authorization (EUA). This EUA will remain  in effect (meaning this test can be used)  for the duration of the COVID-19 declaration under Section 56 4(b)(1) of the Act, 21 U.S.C. section 360bbb-3(b)(1), unless the authorization is terminated or revoked sooner. Performed at [facility], [place] [address] [place], [place] [address]   Culture, blood (routine x 2)     Status: Abnormal (Preliminary result)   Collection Time: 07/08/19 10:53 AM   Specimen: BLOOD  Result Value Ref Range Status   Specimen Description BLOOD RIGHT ANTECUBITAL  Final   Special Requests   Final    BOTTLES DRAWN AEROBIC AND ANAEROBIC Blood Culture adequate volume   Culture  Setup Time (A)  Final    GRAM VARIABLE ROD ANAEROBIC BOTTLE  ONLY Organism ID to follow Performed at [facility], [place] [address] [place], [place] [address]    Culture PENDING  Incomplete   Report Status PENDING  Incomplete  SARS Coronavirus 2 (CEPHEID - Performed in [facility] hospital lab), Hosp Order     Status: None   Collection Time: 07/08/19 11:34 AM   Specimen: Nasopharyngeal Swab  Result Value Ref Range Status   SARS Coronavirus 2 NEGATIVE NEGATIVE Final    Comment: (NOTE) If result is NEGATIVE SARS-CoV-2 target nucleic acids are NOT DETECTED. The SARS-CoV-2 RNA is generally detectable in upper and lower  respiratory specimens during the acute phase of infection. The lowest  concentration of SARS-CoV-2 viral copies this assay can detect is 250  copies / mL. A negative result does not preclude SARS-CoV-2 infection  and should not be used as the sole basis for treatment or other  patient management decisions.  A negative result may occur with  improper specimen collection / handling, submission of specimen other  than nasopharyngeal swab, presence of viral mutation(s) within the  areas targeted by this assay, and inadequate number of viral copies  (<250 copies / mL). A negative result must be combined with clinical  observations, patient history, and epidemiological information. If result is POSITIVE SARS-CoV-2 target nucleic acids are DETECTED. The SARS-CoV-2 RNA is generally detectable in upper and lower  respiratory specimens dur ing the acute phase of infection.  Positive  results are indicative of active infection with SARS-CoV-2.  Clinical  correlation with patient history and other diagnostic information is  necessary to determine patient infection status.  Positive results do  not rule out bacterial infection or co-infection with other viruses. If result is PRESUMPTIVE POSTIVE SARS-CoV-2 nucleic acids MAY BE PRESENT.   A presumptive positive result was obtained on the submitted specimen  and confirmed on repeat  testing.  While 2019 novel coronavirus  (SARS-CoV-2) nucleic acids may be present in the submitted sample  additional confirmatory testing may be necessary for epidemiological  and / or clinical management purposes  to differentiate between  SARS-CoV-2 and other Sarbecovirus currently known to infect humans.  If clinically indicated additional testing with an alternate test  methodology [phone]) is advised. The SARS-CoV-2 RNA is generally  detectable in upper and lower respiratory sp ecimens during the acute  phase of infection. The expected result is Negative. Fact Sheet for Patients:  StrictlyIdeas.no Fact Sheet for Healthcare Providers: BankingDealers.co.za This test is not yet approved or cleared by the [place] FDA and has been authorized for detection and/or diagnosis of SARS-CoV-2 by FDA under an Emergency Use Authorization (EUA).  This EUA will remain in effect (meaning this test can be used) for the duration of the COVID-19 declaration under Section 564(b)(1) of the Act, 21 U.S.C. section 360bbb-3(b)(1), unless the  authorization is terminated or revoked sooner. Performed at [facility], [place] [address], [place], [place] [address]          Radiology Studies: US Renal  Result Date: 07/09/2019 CLINICAL DATA:  Acute renal failure. EXAM: RENAL / URINARY TRACT ULTRASOUND COMPLETE COMPARISON:  PET CT 05/26/2019 FINDINGS: Right Kidney: Renal measurements: 11 x 6.6 x 7.3 cm = volume: 179.7 mL. No hydronephrosis. Complex cyst in the mid right kidney measuring 7 x 3.9 x 5.2 cm. Stone on prior PET-CT is not visualized. Left Kidney: Renal measurements: 10 x 6.2 x 5.5 cm = volume: 179 mL. Echogenicity within normal limits. No mass or hydronephrosis visualized. Bladder: Appears normal for degree of bladder distention. IMPRESSION: 1. No obstructive uropathy. 2. Right renal cyst. Right nephrolithiasis on prior PET CT not demonstrated  sonographically. Electronically Signed   By: Keith Rake M.D.   On: 07/09/2019 00:00   Dg Chest Port 1 View  Result Date: 07/08/2019 CLINICAL DATA:  Shortness of breath and fever. EXAM: PORTABLE CHEST 1 VIEW COMPARISON:  PET-CT May 26, 2019 and chest radiograph April 28, 2019 FINDINGS: The previously noted right lower lobe mass is again apparent, measuring 5.1 x 3.8 cm. There is no appreciable edema or consolidation. Heart is mildly enlarged with pulmonary vascularity normal. No adenopathy evident. No bone lesions. IMPRESSION: Persistent right lower lobe mass. No new opacity evident. Heart mildly enlarged. No adenopathy demonstrable by radiography. Electronically Signed   By: Lowella Grip III M.D.   On: 07/08/2019 11:33        Scheduled Meds:  allopurinol  300 mg Oral Daily   atorvastatin  20 mg Oral Daily   ferrous sulfate  325 mg Oral Q M,W,F   fluticasone furoate-vilanterol  1 puff Inhalation BID   folic acid  1 mg Oral Daily   heparin  5,000 Units Subcutaneous Q8H   insulin aspart  0-15 Units Subcutaneous TID WC   mouth rinse  15 mL Mouth Rinse BID   mometasone-formoterol  2 puff Inhalation BID   pantoprazole  40 mg Oral BID AC   senna-docusate  1 tablet Oral QHS   sucralfate  1 g Oral 5 X Daily   Continuous Infusions:  sodium chloride Stopped (07/08/19 1959)   lactated ringers 75 mL/hr at 07/09/19 0628   magnesium sulfate bolus IVPB 2 g (07/09/19 0704)   potassium chloride Stopped (07/09/19 0703)   potassium chloride       LOS: 1 day     Cordelia Poche, MD [facility] 07/09/2019, 7:19 AM  If 7PM-7AM, please contact night-coverage [email or web address]

## 2019-07-09 NOTE — TOC Initial Note (Signed)
Transition of Care [facility]) - Initial/Assessment Note    Patient Details  Name: John [name] MRN: [id number] Date of Birth: 07-Oct-1946  Transition of Care [facility] CM/SW Contact:    Candie Chroman, LCSW Phone Number: 07/09/2019, 12:03 PM  Clinical Narrative: Readmission prevention screen completed with patient and daughter at bedside. Patient's PCP is [facility]. Pharmacy is [facility]. Patient lives with daughter. He has no home health services and does not use DME at home. He does have home oxygen. Granddaughter drives him to appts. No further concerns. CSW encouraged patient and his daughter to contact CSW as needed. CSW will continue to follow patient and his daughter for support and facilitate return home once stable.                 Expected Discharge Plan: Home/Self Care Barriers to Discharge: Continued Medical Work up   Patient Goals and CMS Choice Patient states their goals for this hospitalization and ongoing recovery are:: Patient not fully oriented.      Expected Discharge Plan and Services Expected Discharge Plan: Home/Self Care       Living arrangements for the past 2 months: Single Family Home Expected Discharge Date: 07/10/19                                    Prior Living Arrangements/Services Living arrangements for the past 2 months: Single Family Home Lives with:: Adult Children Patient language and need for interpreter reviewed:: Yes(No needs.) Do you feel safe going back to the place where you live?: Yes      Need for Family Participation in Patient Care: Yes (Comment) Care giver support system in place?: Yes (comment) Current home services: Other (comment)(Home oxygen.) Criminal Activity/Legal Involvement Pertinent to Current Situation/Hospitalization: No - Comment as needed  Activities of Daily Living Home Assistive Devices/Equipment: None ADL Screening (condition at time of admission) Patient's cognitive ability adequate  to safely complete daily activities?: Yes Is the patient deaf or have difficulty hearing?: No Does the patient have difficulty seeing, even when wearing glasses/contacts?: No Does the patient have difficulty concentrating, remembering, or making decisions?: No Patient able to express need for assistance with ADLs?: Yes Does the patient have difficulty dressing or bathing?: No Independently performs ADLs?: Yes (appropriate for developmental age) Does the patient have difficulty walking or climbing stairs?: No Weakness of Legs: None Weakness of Arms/Hands: None  Permission Sought/Granted Permission sought to share information with : Family Supports Permission granted to share information with : Yes, Verbal Permission Granted  Share Information with NAME: John [name]     Permission granted to share info w Relationship: Daughter  Permission granted to share info w Contact Information: [phone]  Emotional Assessment Appearance:: Appears stated age Attitude/Demeanor/Rapport: Engaged Affect (typically observed): Appropriate, Calm Orientation: : Oriented to Self, Oriented to Place Alcohol / Substance Use: Never Used Psych Involvement: No (comment)  Admission diagnosis:  Dehydration [E86.0] Hypokalemia [E87.6] Hypomagnesemia [E83.42] Coagulopathy ([place]) [D68.9] Generalized weakness [R53.1] Hypotension, unspecified hypotension type [I95.9] Acute renal failure, unspecified acute renal failure type ([place]) [N17.9] Leukocytosis, unspecified type [D72.829] Patient Active Problem List   Diagnosis Date Noted  . Acute renal failure (ARF) ([place]) 07/08/2019  . Hypokalemia 07/08/2019  . Vascular dementia without behavioral disturbance ([place])   . Helicobacter pylori gastritis   . Chronic duodenal ulcer with hemorrhage   . Multiple gastric ulcers   . Acute esophagitis   .  Gastrointestinal hemorrhage 04/20/2019  . Vitamin B12 deficiency 04/20/2019  . Folate deficiency 04/20/2019  .  Dehydration 04/20/2019  . Hypotension 04/20/2019  . Melena   . Acute blood loss anemia   . CKD (chronic kidney disease), stage III ([place]) 04/08/2019  . Right lower lobe lung mass 04/08/2019  . Syncope and collapse 04/08/2019  . Symptomatic anemia 04/08/2019  . Occult GI bleeding 04/08/2019  . Hyperglycemia 04/08/2019  . Leukocytosis 04/08/2019  . Syncope 04/08/2019  . Low hemoglobin   . Dyspnea on exertion 08/25/2017  . Bursitis of elbow 03/15/2016  . Obesity (BMI 30-39.9) 12/03/2015  . Primary cancer of right lower lobe of lung ([place]) 06/14/2015  . Solitary pulmonary nodule 03/13/2015  . RVF (right ventricular failure) ([place]) 11/04/2014  . COPD II/III with reversibility  12/19/2012  . Chronic respiratory failure with hypoxia ([place]) 12/18/2012  . Drug-induced hyperglycemia 12/08/2012  . Allergic drug rash 12/06/2012  . Pulmonary HTN ([place]) 03/18/2012  . Abnormal echocardiogram 03/18/2012  . LV dysfunction 03/18/2012  . COPD exacerbation ([place]) 02/22/2012  . Chronic diastolic CHF (congestive heart failure) ([place]) 02/22/2012  . HTN (hypertension) 02/22/2012  . CAD (coronary artery disease) 02/22/2012  . Acute on chronic systolic heart failure ([place]) 02/22/2012   PCP:  [name], NP Pharmacy:   [facility], [place] - [address] [place] [address] Phone: [phone] Fax: [phone]     Social Determinants of Health (SDOH) Interventions    Readmission Risk Interventions Readmission Risk Prevention Plan 07/09/2019 04/30/2019 04/30/2019  Transportation Screening Complete - Complete  Medication Review (RN Care Manager) Complete - Complete  PCP or Specialist appointment within 3-5 days of discharge - Complete -  [place] or Home Care Consult Complete - Complete  SW Recovery Care/Counseling Consult Complete - -  Palliative Care Screening Not Applicable - Not [place] Not Applicable - Not Applicable   Some recent data might be hidden

## 2019-07-09 NOTE — Progress Notes (Addendum)
0730 Pt very agitated, removed telemonitor, pt ID band, yelling at staff, refusing care, and threatening to leave. Patient's daughter and MD aware.   Pt lying on the bed calmly at this time. Will monitor accordingly.

## 2019-07-09 NOTE — Progress Notes (Signed)
Potassium 2.3, provider paged.

## 2019-07-09 NOTE — Progress Notes (Signed)
Lactic acid 3.9, provider paged.

## 2019-07-09 NOTE — Progress Notes (Signed)
Patient confused throughout the night, but easily reoriented and diverted.

## 2019-07-10 LAB — CBC
HCT: 29.1 % — ABNORMAL LOW (ref 39.0–52.0)
Hemoglobin: 9.3 g/dL — ABNORMAL LOW (ref 13.0–17.0)
MCH: 29.7 pg (ref 26.0–34.0)
MCHC: 32 g/dL (ref 30.0–36.0)
MCV: 93 fL (ref 80.0–100.0)
Platelets: 198 10*3/uL (ref 150–400)
RBC: 3.13 MIL/uL — ABNORMAL LOW (ref 4.22–5.81)
RDW: 18.5 % — ABNORMAL HIGH (ref 11.5–15.5)
WBC: 21.9 10*3/uL — ABNORMAL HIGH (ref 4.0–10.5)
nRBC: 0 % (ref 0.0–0.2)

## 2019-07-10 LAB — BASIC METABOLIC PANEL
Anion gap: 12 (ref 5–15)
BUN: 37 mg/dL — ABNORMAL HIGH (ref 8–23)
CO2: 23 mmol/L (ref 22–32)
Calcium: 7.9 mg/dL — ABNORMAL LOW (ref 8.9–10.3)
Chloride: 109 mmol/L (ref 98–111)
Creatinine, Ser: 2.9 mg/dL — ABNORMAL HIGH (ref 0.61–1.24)
GFR calc Af Amer: 24 mL/min — ABNORMAL LOW (ref >60)
GFR calc non Af Amer: 20 mL/min — ABNORMAL LOW (ref >60)
Glucose, Bld: 117 mg/dL — ABNORMAL HIGH (ref 70–99)
Potassium: 2.8 mmol/L — ABNORMAL LOW (ref 3.5–5.1)
Sodium: 144 mmol/L (ref 135–145)

## 2019-07-10 LAB — GLUCOSE, CAPILLARY
Glucose-Capillary: 107 mg/dL — ABNORMAL HIGH (ref 70–99)
Glucose-Capillary: 108 mg/dL — ABNORMAL HIGH (ref 70–99)
Glucose-Capillary: 135 mg/dL — ABNORMAL HIGH (ref 70–99)

## 2019-07-10 LAB — RAPID URINE DRUG SCREEN, HOSP PERFORMED
Amphetamines: NOT DETECTED
Barbiturates: NOT DETECTED
Benzodiazepines: NOT DETECTED
Cocaine: NOT DETECTED
Opiates: NOT DETECTED
Tetrahydrocannabinol: NOT DETECTED

## 2019-07-10 LAB — POTASSIUM: Potassium: 3.1 mmol/L — ABNORMAL LOW (ref 3.5–5.1)

## 2019-07-10 LAB — CULTURE, BLOOD (ROUTINE X 2): Special Requests: ADEQUATE

## 2019-07-10 LAB — MAGNESIUM
Magnesium: 1.5 mg/dL — ABNORMAL LOW (ref 1.7–2.4)
Magnesium: 1.5 mg/dL — ABNORMAL LOW (ref 1.7–2.4)

## 2019-07-10 MED ORDER — POTASSIUM CHLORIDE CRYS ER 20 MEQ PO TBCR
40.0000 meq | EXTENDED_RELEASE_TABLET | ORAL | Status: AC
  Administered 2019-07-10 (×2): 40 meq via ORAL
  Filled 2019-07-10 (×2): qty 2

## 2019-07-10 MED ORDER — FLUTICASONE FUROATE-VILANTEROL 100-25 MCG/INH IN AEPB
1.0000 | INHALATION_SPRAY | Freq: Every day | RESPIRATORY_TRACT | Status: DC
  Administered 2019-07-11 – 2019-07-16 (×6): 1 via RESPIRATORY_TRACT
  Filled 2019-07-10: qty 28

## 2019-07-10 MED ORDER — POTASSIUM CHLORIDE CRYS ER 20 MEQ PO TBCR
40.0000 meq | EXTENDED_RELEASE_TABLET | Freq: Once | ORAL | Status: AC
  Administered 2019-07-11: 40 meq via ORAL
  Filled 2019-07-10: qty 2

## 2019-07-10 MED ORDER — MAGNESIUM SULFATE 2 GM/50ML IV SOLN
2.0000 g | Freq: Once | INTRAVENOUS | Status: AC
  Administered 2019-07-11: 2 g via INTRAVENOUS
  Filled 2019-07-10: qty 50

## 2019-07-10 MED ORDER — MAGNESIUM SULFATE 2 GM/50ML IV SOLN
2.0000 g | Freq: Once | INTRAVENOUS | Status: AC
  Administered 2019-07-10: 2 g via INTRAVENOUS
  Filled 2019-07-10: qty 50

## 2019-07-10 NOTE — Progress Notes (Signed)
Patient has removed his tele again, stating 'hes fine without it'.

## 2019-07-10 NOTE — Progress Notes (Signed)
After discussion with patient, he is now agreeable to have blood drawn with hopes that it results in him being discharged tomorrow. MD notified and entered lab orders.

## 2019-07-10 NOTE — Progress Notes (Signed)
Patient's daughter lynette was provided an update at end-of-shift, and patient's request for clothing was relayed to her. (pt states if he gets new clothes, then he will be more willing to stay/cooperate).  Per Willette Cluster, she will be here tomorrow with clothing; will call in the morning to make sure there is an understanding that due to the patient's dementia/difficulty that she should be allowed to the room.   Willette Cluster was advised to call in the morning once day shift is in to verify everyone is on the same page/parameters of visitation.

## 2019-07-10 NOTE — Progress Notes (Addendum)
Patient resting comfortably during shift report. Eating breakfast, on room air. Denies complaints.  Pt does have IVF infusing.

## 2019-07-10 NOTE — Progress Notes (Signed)
PROGRESS NOTE    John [name]  NFA:[id number] DOB: 29-Apr-1946 DOA: 07/08/2019 PCP: [name], NP   Brief Narrative: John [name] is a 73 y.o. 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  Patient presented secondary to altered mental status and found to have hypotension, acute kidney injury, electrolyte abnormalities.   Assessment & Plan:   Principal Problem:   Acute renal failure (ARF) (HCC) Active Problems:   Chronic diastolic CHF (congestive heart failure) (HCC)   HTN (hypertension)   COPD II/III with reversibility    Primary cancer of right lower lobe of lung (HCC)   CKD (chronic kidney disease), stage III (HCC)   Hypotension   Vascular dementia without behavioral disturbance (HCC)   Hypokalemia   AKI on CKD stage III Baseline creatinine of about 1. Worsening of creatinine up to 4.54 on admission and trending down slowly.  Likely secondary to hypovolemia of unknown cause. -Continue IV fluids -Daily BMP  Hypotension Improved. Home medications held for now -IV fluids as mentioned above  Leukocytosis Unknown etiology.  No obvious source for infection.  Patient did arrive with hypotension.  Sepsis mentioned in H&P, however no evidence of sepsis from my assessment.  Patient does have blood cultures obtained with results of 1 out of 4 bottles significant for gram variable rod which is concerning for contamination.  Urine culture with no growth. -Follow blood cultures -Daily CBC -Repeat blood cultures  Confusion Associated agitation and a history of dementia.  Likely delirium. Improved -Continue Seroquel  Hypokalemia Hypomagnesemia Still low -Replete as needed with PO potassium and IV magnesium -Telemetry  Diabetes mellitus -Continue SSI  Chronic diastolic heart failure Currently compensated  COPD  Right lower lobe lung carcinoma Patient is on chronic oxygen per chart review.  For cancer, appears patient was scheduled for biopsy today.  Patient will need to follow-up as an outpatient. -Continue Breo Ellipta, Symbicort, O2 as needed  Dementia Patient appears to be suffering from hospital associated delirium.  Management above.   DVT prophylaxis: Heparin subcutaneous Code Status:   Code Status: Full Code Family Communication: None Disposition Plan: Discharge pending continued medical management   Consultants:   None  Procedures:   None  Antimicrobials:  Vancomycin (7/9)  Cefepime (7/9)   Subjective: No issues per patient.  Objective: Vitals:   07/10/19 0718 07/10/19 0823 07/10/19 0824 07/10/19 1248  BP:  (!) 118/55 (!) 118/55 (!) 116/51  Pulse:  82 82 96  Resp:   20 20  Temp:  98.3 F (36.8 C) 98.3 F (36.8 C) 97.9 F (36.6 C)  TempSrc:  Oral Oral Oral  SpO2: 94% 91% (!) 89% 94%  Weight:      Height:        Intake/Output Summary (Last 24 hours) at 07/10/2019 1312 Last data filed at 07/10/2019 0946 Gross per 24 hour  Intake 1892.04 ml  Output 300 ml  Net 1592.04 ml   Filed Weights   07/08/19 1654 07/09/19 0600 07/10/19 0500  Weight: 84.4 kg 85.2 kg 86.5 kg    Examination:  General exam: Appears calm and comfortable Respiratory system: Clear to auscultation. Respiratory effort normal. Cardiovascular system: S1 & S2 heard, RRR. No murmurs, rubs, gallops or clicks. Gastrointestinal system: Abdomen is nondistended, soft and nontender. No organomegaly or masses felt. Normal bowel sounds heard. Central nervous system: Alert and oriented. No focal neurological deficits. Extremities: No edema.  No calf tenderness Skin: No cyanosis. No rashes Psychiatry: Judgement and insight appear normal. Mood & affect appropriate.     Data Reviewed: I have personally reviewed following labs and imaging studies  CBC: Recent Labs  Lab 07/08/19 1037 07/09/19 0117  07/10/19 0732  WBC 23.5* 24.2* 21.9*  NEUTROABS 21.6*  --   --   HGB 10.4* 9.1* 9.3*  HCT 32.1* 27.8* 29.1*  MCV 93.6 92.4 93.0  PLT 198 181 096   Basic Metabolic Panel: Recent Labs  Lab 07/08/19 1037 07/08/19 1849 07/09/19 0117 07/09/19 0929 07/09/19 1716 07/10/19 0732  NA 142 143 142  --   --  144  K 2.2* 2.8* 2.3* 3.5 3.2* 2.8*  CL 102 108 109  --   --  109  CO2 24 18* 20*  --   --  23  GLUCOSE 108* 86 92  --   --  117*  BUN 38* 40* 40*  --   --  37*  CREATININE 4.54* 4.18* 4.04*  --   --  2.90*  CALCIUM 7.8* 7.2* 7.2*  --   --  7.9*  MG 0.8*  --  1.1*  --  1.6* 1.5*   GFR: Estimated Creatinine Clearance: 23.5 mL/min (A) (by C-G formula based on SCr of 2.9 mg/dL (H)). Liver Function Tests: Recent Labs  Lab 07/08/19 1037  AST 16  ALT 10  ALKPHOS 61  BILITOT 1.3*  PROT 6.7  ALBUMIN 3.3*   Recent Labs  Lab 07/08/19 1037  LIPASE 19   No results for input(s): AMMONIA in the last 168 hours. Coagulation Profile: Recent Labs  Lab 07/08/19 1220 07/09/19 0117  INR 1.4* 1.4*   Cardiac Enzymes: No results for input(s): CKTOTAL, CKMB, CKMBINDEX, TROPONINI in the last 168 hours. BNP (last 3 results) No results for input(s): PROBNP in the last 8760 hours. HbA1C: No results for input(s): HGBA1C in the last 72 hours. CBG: Recent Labs  Lab 07/09/19 1153 07/09/19 1659 07/09/19 2111 07/10/19 0559 07/10/19 1053  GLUCAP 150* 152* 127* 107* 108*   Lipid Profile: No results for input(s): CHOL, HDL, LDLCALC, TRIG, CHOLHDL, LDLDIRECT in the last 72 hours. Thyroid Function Tests: No results for input(s): TSH, T4TOTAL, FREET4, T3FREE, THYROIDAB in the last 72 hours. Anemia Panel: No results for input(s): VITAMINB12, FOLATE, FERRITIN, TIBC, IRON, RETICCTPCT in the last 72 hours. Sepsis Labs: Recent Labs  Lab 07/08/19 1053 07/08/19 1222 07/08/19 1906 07/09/19 0117  LATICACIDVEN 2.1* 2.1* 3.9* 2.9*    Recent Results (from the past 240 hour(s))  SARS  Coronavirus 2 (Performed in [facility] hospital lab)     Status: None   Collection Time: 07/05/19  3:50 PM  Result Value Ref Range Status   SARS Coronavirus 2 NEGATIVE NEGATIVE Final    Comment: (NOTE) SARS-CoV-2 target nucleic acids are NOT DETECTED. The SARS-CoV-2 RNA is generally detectable in upper and lower respiratory specimens during the acute phase of infection. Negative results do not preclude SARS-CoV-2 infection, do not rule out co-infections with other pathogens, and should not be used as the sole basis for treatment or other patient management decisions. Negative results must be combined with clinical observations, patient history, and epidemiological information. The expected result is Negative. Fact Sheet for Patients: SugarRoll.be Fact Sheet for Healthcare Providers: https://www.woods-mathews.com/ This test is not yet approved or cleared by the [place] FDA and  has been authorized for detection and/or diagnosis of SARS-CoV-2 by FDA under an Emergency Use Authorization (EUA). This EUA will remain  in effect (meaning this test can be used) for the duration of the COVID-19 declaration under Section 56 4(b)(1) of the Act, 21 U.S.C. section 360bbb-3(b)(1), unless the authorization is terminated or revoked sooner. Performed at [facility], [place] [address], [place], [place] [address]   Culture, blood (routine x 2)     Status: Abnormal (Preliminary result)   Collection Time: 07/08/19 10:53 AM   Specimen: BLOOD  Result Value Ref Range Status   Specimen Description BLOOD RIGHT ANTECUBITAL  Final   Special Requests   Final    BOTTLES DRAWN AEROBIC AND ANAEROBIC Blood Culture adequate volume   Culture  Setup Time (A)  Final    GRAM VARIABLE ROD ANAEROBIC BOTTLE ONLY CRITICAL RESULT CALLED TO, READ BACK BY AND VERIFIED WITH: Susy Manor [id number] Powell Performed at [facility], [place] [address]  [place], [place] [address]    Culture GRAM VARIABLE ROD (A)  Final   Report Status PENDING  Incomplete  Blood Culture ID Panel (Reflexed)     Status: None   Collection Time: 07/08/19 10:53 AM  Result Value Ref Range Status   Enterococcus species NOT DETECTED NOT DETECTED Final    Comment: CRITICAL RESULT CALLED TO, READ BACK BY AND VERIFIED WITH: PHARMD JEREMY FRENS [id number] [id number] FCP    Listeria monocytogenes NOT DETECTED NOT DETECTED Final   Staphylococcus species NOT DETECTED NOT DETECTED Final   Staphylococcus aureus (BCID) NOT DETECTED NOT DETECTED Final   Streptococcus species NOT DETECTED NOT DETECTED Final   Streptococcus agalactiae NOT DETECTED NOT DETECTED Final   Streptococcus pneumoniae NOT DETECTED NOT DETECTED Final   Streptococcus pyogenes NOT DETECTED NOT DETECTED Final   Acinetobacter baumannii NOT DETECTED NOT DETECTED Final   Enterobacteriaceae species NOT DETECTED NOT DETECTED Final   Enterobacter cloacae complex NOT DETECTED NOT DETECTED Final   Escherichia coli NOT DETECTED NOT DETECTED Final   Klebsiella oxytoca NOT DETECTED NOT DETECTED Final   Klebsiella pneumoniae NOT DETECTED NOT DETECTED Final   Proteus species NOT DETECTED NOT DETECTED Final   Serratia marcescens NOT DETECTED NOT DETECTED Final   Haemophilus influenzae NOT DETECTED NOT DETECTED Final   Neisseria meningitidis NOT DETECTED NOT DETECTED Final   Pseudomonas aeruginosa NOT DETECTED NOT DETECTED Final   Candida albicans NOT DETECTED NOT DETECTED Final   Candida glabrata NOT DETECTED NOT DETECTED Final   Candida krusei NOT DETECTED NOT DETECTED Final   Candida parapsilosis NOT DETECTED NOT DETECTED Final   Candida tropicalis NOT DETECTED NOT DETECTED Final    Comment: Performed at [facility], [address], [place], [place] [address]  Culture, blood (routine x 2)     Status: None (Preliminary result)   Collection Time: 07/08/19 11:30 AM   Specimen: BLOOD RIGHT HAND  Result Value Ref Range  Status   Specimen Description BLOOD RIGHT HAND  Final   Special Requests NONE  Final   Culture   Final    NO GROWTH 1 DAY Performed at [facility], [place] [address], [place],  [address]    Report Status PENDING  Incomplete  SARS Coronavirus 2 (CEPHEID - Performed in [place] hospital lab), Hosp Order     Status: None   Collection Time: 07/08/19 11:34 AM   Specimen: Nasopharyngeal Swab  Result Value Ref Range Status   SARS Coronavirus 2 NEGATIVE NEGATIVE Final    Comment: (NOTE) If result is NEGATIVE SARS-CoV-2 target nucleic acids are NOT DETECTED. The  SARS-CoV-2 RNA is generally detectable in upper and lower  respiratory specimens during the acute phase of infection. The lowest  concentration of SARS-CoV-2 viral copies this assay can detect is 250  copies / mL. A negative result does not preclude SARS-CoV-2 infection  and should not be used as the sole basis for treatment or other  patient management decisions.  A negative result may occur with  improper specimen collection / handling, submission of specimen other  than nasopharyngeal swab, presence of viral mutation(s) within the  areas targeted by this assay, and inadequate number of viral copies  (<250 copies / mL). A negative result must be combined with clinical  observations, patient history, and epidemiological information. If result is POSITIVE SARS-CoV-2 target nucleic acids are DETECTED. The SARS-CoV-2 RNA is generally detectable in upper and lower  respiratory specimens dur ing the acute phase of infection.  Positive  results are indicative of active infection with SARS-CoV-2.  Clinical  correlation with patient history and other diagnostic information is  necessary to determine patient infection status.  Positive results do  not rule out bacterial infection or co-infection with other viruses. If result is PRESUMPTIVE POSTIVE SARS-CoV-2 nucleic acids MAY BE PRESENT.   A presumptive positive result was  obtained on the submitted specimen  and confirmed on repeat testing.  While 2019 novel coronavirus  (SARS-CoV-2) nucleic acids may be present in the submitted sample  additional confirmatory testing may be necessary for epidemiological  and / or clinical management purposes  to differentiate between  SARS-CoV-2 and other Sarbecovirus currently known to infect humans.  If clinically indicated additional testing with an alternate test  methodology [phone]) is advised. The SARS-CoV-2 RNA is generally  detectable in upper and lower respiratory sp ecimens during the acute  phase of infection. The expected result is Negative. Fact Sheet for Patients:  StrictlyIdeas.no Fact Sheet for Healthcare Providers: BankingDealers.co.za This test is not yet approved or cleared by the [place] FDA and has been authorized for detection and/or diagnosis of SARS-CoV-2 by FDA under an Emergency Use Authorization (EUA).  This EUA will remain in effect (meaning this test can be used) for the duration of the COVID-19 declaration under Section 564(b)(1) of the Act, 21 U.S.C. section 360bbb-3(b)(1), unless the authorization is terminated or revoked sooner. Performed at [facility], [place] [address], [place], [place] [address]   Urine culture     Status: None   Collection Time: 07/08/19 12:32 PM   Specimen: Urine, Clean Catch  Result Value Ref Range Status   Specimen Description URINE, CLEAN CATCH  Final   Special Requests NONE  Final   Culture   Final    NO GROWTH Performed at [facility], [place] [address] [place], [place] [address]    Report Status 07/09/2019 FINAL  Final         Radiology Studies: US Renal  Result Date: 07/09/2019 CLINICAL DATA:  Acute renal failure. EXAM: RENAL / URINARY TRACT ULTRASOUND COMPLETE COMPARISON:  PET CT 05/26/2019 FINDINGS: Right Kidney: Renal measurements: 11 x 6.6 x 7.3 cm = volume: 179.7 mL. No  hydronephrosis. Complex cyst in the mid right kidney measuring 7 x 3.9 x 5.2 cm. Stone on prior PET-CT is not visualized. Left Kidney: Renal measurements: 10 x 6.2 x 5.5 cm = volume: 179 mL. Echogenicity within normal limits. No mass or hydronephrosis visualized. Bladder: Appears normal for degree of bladder distention. IMPRESSION: 1. No obstructive uropathy. 2. Right renal cyst. Right nephrolithiasis  on prior PET CT not demonstrated sonographically. Electronically Signed   By: Keith Rake M.D.   On: 07/09/2019 00:00        Scheduled Meds: . allopurinol  300 mg Oral Daily  . atorvastatin  20 mg Oral Daily  . ferrous sulfate  325 mg Oral Q M,W,F  . fluticasone furoate-vilanterol  1 puff Inhalation BID  . folic acid  1 mg Oral Daily  . heparin  5,000 Units Subcutaneous Q8H  . insulin aspart  0-15 Units Subcutaneous TID WC  . mouth rinse  15 mL Mouth Rinse BID  . mometasone-formoterol  2 puff Inhalation BID  . pantoprazole  40 mg Oral BID AC  . potassium chloride  40 mEq Oral Q4H  . QUEtiapine  25 mg Oral QHS  . senna-docusate  1 tablet Oral QHS  . sucralfate  1 g Oral 5 X Daily   Continuous Infusions: . sodium chloride Stopped (07/08/19 1959)  . lactated ringers 75 mL/hr at 07/10/19 0546  . magnesium sulfate bolus IVPB       LOS: 2 days     Cordelia Poche, MD [facility] 07/10/2019, 1:12 PM  If 7PM-7AM, please contact night-coverage [email or web address]

## 2019-07-11 LAB — GLUCOSE, CAPILLARY
Glucose-Capillary: 123 mg/dL — ABNORMAL HIGH (ref 70–99)
Glucose-Capillary: 99 mg/dL (ref 70–99)
Glucose-Capillary: 97 mg/dL (ref 70–99)
Glucose-Capillary: 94 mg/dL (ref 70–99)

## 2019-07-11 LAB — CBC
HCT: 26.4 % — ABNORMAL LOW (ref 39.0–52.0)
Hemoglobin: 8.6 g/dL — ABNORMAL LOW (ref 13.0–17.0)
MCH: 29.9 pg (ref 26.0–34.0)
MCHC: 32.6 g/dL (ref 30.0–36.0)
MCV: 91.7 fL (ref 80.0–100.0)
Platelets: 200 10*3/uL (ref 150–400)
RBC: 2.88 MIL/uL — ABNORMAL LOW (ref 4.22–5.81)
RDW: 18.6 % — ABNORMAL HIGH (ref 11.5–15.5)
WBC: 18.1 10*3/uL — ABNORMAL HIGH (ref 4.0–10.5)
nRBC: 0.1 % (ref 0.0–0.2)

## 2019-07-11 LAB — BASIC METABOLIC PANEL
Anion gap: 9 (ref 5–15)
BUN: 28 mg/dL — ABNORMAL HIGH (ref 8–23)
CO2: 24 mmol/L (ref 22–32)
Calcium: 8.1 mg/dL — ABNORMAL LOW (ref 8.9–10.3)
Chloride: 113 mmol/L — ABNORMAL HIGH (ref 98–111)
Creatinine, Ser: 2.08 mg/dL — ABNORMAL HIGH (ref 0.61–1.24)
GFR calc Af Amer: 35 mL/min — ABNORMAL LOW (ref >60)
GFR calc non Af Amer: 30 mL/min — ABNORMAL LOW (ref >60)
Glucose, Bld: 123 mg/dL — ABNORMAL HIGH (ref 70–99)
Potassium: 3.4 mmol/L — ABNORMAL LOW (ref 3.5–5.1)
Sodium: 146 mmol/L — ABNORMAL HIGH (ref 135–145)

## 2019-07-11 MED ORDER — QUETIAPINE FUMARATE 25 MG PO TABS
25.0000 mg | ORAL_TABLET | Freq: Two times a day (BID) | ORAL | Status: DC
  Administered 2019-07-11 – 2019-07-16 (×11): 25 mg via ORAL
  Filled 2019-07-11 (×11): qty 1

## 2019-07-11 NOTE — Progress Notes (Signed)
Call made to RN regarding PIV access needs. Per RN, pt just fell asleep and PIV not an immediate need. RN to place new consult if PIV needed at later time.

## 2019-07-11 NOTE — Progress Notes (Signed)
PROGRESS NOTE    John [name]  ZDG:[id number] DOB: 1946/07/19 DOA: 07/08/2019 PCP: [name], NP   Brief Narrative: John [name] is a 73 y.o. 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  Patient presented secondary to altered mental status and found to have hypotension, acute kidney injury, electrolyte abnormalities.   Assessment & Plan:   Principal Problem:   Acute renal failure (ARF) (HCC) Active Problems:   Chronic diastolic CHF (congestive heart failure) (HCC)   HTN (hypertension)   COPD II/III with reversibility    Primary cancer of right lower lobe of lung (HCC)   CKD (chronic kidney disease), stage III (HCC)   Hypotension   Vascular dementia without behavioral disturbance (HCC)   Hypokalemia   AKI on CKD stage III Baseline creatinine of about 1. Worsening of creatinine up to 4.54 on admission and trending down slowly.  Likely secondary to hypovolemia of unknown cause. -Continue IV fluids -Daily BMP  Hypotension Improved. Home medications held for now -IV fluids as mentioned above  Leukocytosis Unknown etiology.  No obvious source for infection.  Patient did arrive with hypotension.  Sepsis mentioned in H&P, however no evidence of sepsis from my assessment.  Patient does have blood cultures obtained with results of 1 out of 4 bottles significant for gram variable rod which is concerning for contamination.  Urine culture with no growth. -Follow blood cultures -Daily CBC -Repeat blood cultures  Confusion Associated agitation and a history of dementia.  Likely delirium. Improved but waxes and wanes -Continue Seroquel, prescribe on discharge  Hypernatremia Mild. -Encourage oral intake  Hypokalemia Hypomagnesemia Still low -Replete as needed with PO potassium and IV magnesium -Telemetry -Repeat  BMP and mag in AM  Diabetes mellitus -Continue SSI  Chronic diastolic heart failure Currently compensated  COPD Right lower lobe lung carcinoma Patient is on chronic oxygen per chart review.  For cancer, appears patient was scheduled for biopsy last week.  Patient will need to follow-up as an outpatient. -Continue Breo Ellipta, Symbicort, O2 as needed  Dementia Patient appears to be suffering from hospital associated delirium.  Management above.   DVT prophylaxis: Heparin subcutaneous Code Status:   Code Status: Full Code Family Communication: None Disposition Plan: Discharge pending continued medical management   Consultants:   None  Procedures:   None  Antimicrobials:  Vancomycin (7/9)  Cefepime (7/9)   Subjective: No issues overnight  Objective: Vitals:   07/11/19 0121 07/11/19 0442 07/11/19 0702 07/11/19 0816  BP:  (!) 138/55  134/63  Pulse:  (!) 106  79  Resp:  20  18  Temp:  98.1 F (36.7 C)  97.6 F (36.4 C)  TempSrc:  Oral  Oral  SpO2:  100% 97% 99%  Weight: 86.9 kg     Height:        Intake/Output Summary (Last 24 hours) at 07/11/2019 1330 Last data filed at 07/11/2019 0900 Gross per 24 hour  Intake 2788.58 ml  Output 950 ml  Net 1838.58 ml   Filed Weights   07/09/19 0600 07/10/19 0500 07/11/19 0121  Weight: 85.2 kg 86.5 kg 86.9 kg    Examination:  General exam: Appears calm and comfortable Respiratory system: Clear to auscultation. Respiratory effort normal. Cardiovascular system: S1 & S2 heard, RRR. No murmurs, rubs, gallops or clicks. Gastrointestinal system: Abdomen is nondistended, soft and nontender. No organomegaly or masses felt. Normal bowel  sounds heard. Central nervous system: Alert and oriented to person and place. No focal neurological deficits. Extremities: No edema. No calf tenderness Skin: No cyanosis. No rashes Psychiatry: Judgement and insight appear normal. Mood & affect appropriate.     Data Reviewed: I have  personally reviewed following labs and imaging studies  CBC: Recent Labs  Lab 07/08/19 1037 07/09/19 0117 07/10/19 0732 07/11/19 0238  WBC 23.5* 24.2* 21.9* 18.1*  NEUTROABS 21.6*  --   --   --   HGB 10.4* 9.1* 9.3* 8.6*  HCT 32.1* 27.8* 29.1* 26.4*  MCV 93.6 92.4 93.0 91.7  PLT 198 181 198 350   Basic Metabolic Panel: Recent Labs  Lab 07/08/19 1037 07/08/19 1849 07/09/19 0117 07/09/19 0929 07/09/19 1716 07/10/19 0732 07/10/19 1829 07/11/19 0238  NA 142 143 142  --   --  144  --  146*  K 2.2* 2.8* 2.3* 3.5 3.2* 2.8* 3.1* 3.4*  CL 102 108 109  --   --  109  --  113*  CO2 24 18* 20*  --   --  23  --  24  GLUCOSE 108* 86 92  --   --  117*  --  123*  BUN 38* 40* 40*  --   --  37*  --  28*  CREATININE 4.54* 4.18* 4.04*  --   --  2.90*  --  2.08*  CALCIUM 7.8* 7.2* 7.2*  --   --  7.9*  --  8.1*  MG 0.8*  --  1.1*  --  1.6* 1.5* 1.5*  --    GFR: Estimated Creatinine Clearance: 32.8 mL/min (A) (by C-G formula based on SCr of 2.08 mg/dL (H)). Liver Function Tests: Recent Labs  Lab 07/08/19 1037  AST 16  ALT 10  ALKPHOS 61  BILITOT 1.3*  PROT 6.7  ALBUMIN 3.3*   Recent Labs  Lab 07/08/19 1037  LIPASE 19   No results for input(s): AMMONIA in the last 168 hours. Coagulation Profile: Recent Labs  Lab 07/08/19 1220 07/09/19 0117  INR 1.4* 1.4*   Cardiac Enzymes: No results for input(s): CKTOTAL, CKMB, CKMBINDEX, TROPONINI in the last 168 hours. BNP (last 3 results) No results for input(s): PROBNP in the last 8760 hours. HbA1C: No results for input(s): HGBA1C in the last 72 hours. CBG: Recent Labs  Lab 07/10/19 0559 07/10/19 1053 07/10/19 1633 07/11/19 0633 07/11/19 1149  GLUCAP 107* 108* 135* 123* 99   Lipid Profile: No results for input(s): CHOL, HDL, LDLCALC, TRIG, CHOLHDL, LDLDIRECT in the last 72 hours. Thyroid Function Tests: No results for input(s): TSH, T4TOTAL, FREET4, T3FREE, THYROIDAB in the last 72 hours. Anemia Panel: No results for  input(s): VITAMINB12, FOLATE, FERRITIN, TIBC, IRON, RETICCTPCT in the last 72 hours. Sepsis Labs: Recent Labs  Lab 07/08/19 1053 07/08/19 1222 07/08/19 1906 07/09/19 0117  LATICACIDVEN 2.1* 2.1* 3.9* 2.9*    Recent Results (from the past 240 hour(s))  SARS Coronavirus 2 (Performed in [facility] lab)     Status: None   Collection Time: 07/05/19  3:50 PM  Result Value Ref Range Status   SARS Coronavirus 2 NEGATIVE NEGATIVE Final    Comment: (NOTE) SARS-CoV-2 target nucleic acids are NOT DETECTED. The SARS-CoV-2 RNA is generally detectable in upper and lower respiratory specimens during the acute phase of infection. Negative results do not preclude SARS-CoV-2 infection, do not rule out co-infections with other pathogens, and should not be used as the sole basis for treatment or other patient  management decisions. Negative results must be combined with clinical observations, patient history, and epidemiological information. The expected result is Negative. Fact Sheet for Patients: SugarRoll.be Fact Sheet for Healthcare Providers: https://www.woods-mathews.com/ This test is not yet approved or cleared by the [place] FDA and  has been authorized for detection and/or diagnosis of SARS-CoV-2 by FDA under an Emergency Use Authorization (EUA). This EUA will remain  in effect (meaning this test can be used) for the duration of the COVID-19 declaration under Section 56 4(b)(1) of the Act, 21 U.S.C. section 360bbb-3(b)(1), unless the authorization is terminated or revoked sooner. Performed at [facility], [place] [address] [place], [place] [address]   Culture, blood (routine x 2)     Status: Abnormal   Collection Time: 07/08/19 10:53 AM   Specimen: BLOOD  Result Value Ref Range Status   Specimen Description BLOOD RIGHT ANTECUBITAL  Final   Special Requests   Final    BOTTLES DRAWN AEROBIC AND ANAEROBIC Blood Culture  adequate volume   Culture  Setup Time (A)  Final    GRAM VARIABLE ROD ANAEROBIC BOTTLE ONLY CRITICAL RESULT CALLED TO, READ BACK BY AND VERIFIED WITH: Susy Manor [id number] [place] Performed at [facility], [address] [place], [place] [address]    Culture BACILLUS SPECIES (A)  Final   Report Status 07/10/2019 FINAL  Final  Blood Culture ID Panel (Reflexed)     Status: None   Collection Time: 07/08/19 10:53 AM  Result Value Ref Range Status   Enterococcus species NOT DETECTED NOT DETECTED Final    Comment: CRITICAL RESULT CALLED TO, READ BACK BY AND VERIFIED WITH: PHARMD JEREMY FRENS [id number] [id number] FCP    Listeria monocytogenes NOT DETECTED NOT DETECTED Final   Staphylococcus species NOT DETECTED NOT DETECTED Final   Staphylococcus aureus (BCID) NOT DETECTED NOT DETECTED Final   Streptococcus species NOT DETECTED NOT DETECTED Final   Streptococcus agalactiae NOT DETECTED NOT DETECTED Final   Streptococcus pneumoniae NOT DETECTED NOT DETECTED Final   Streptococcus pyogenes NOT DETECTED NOT DETECTED Final   Acinetobacter baumannii NOT DETECTED NOT DETECTED Final   Enterobacteriaceae species NOT DETECTED NOT DETECTED Final   Enterobacter cloacae complex NOT DETECTED NOT DETECTED Final   Escherichia coli NOT DETECTED NOT DETECTED Final   Klebsiella oxytoca NOT DETECTED NOT DETECTED Final   Klebsiella pneumoniae NOT DETECTED NOT DETECTED Final   Proteus species NOT DETECTED NOT DETECTED Final   Serratia marcescens NOT DETECTED NOT DETECTED Final   Haemophilus influenzae NOT DETECTED NOT DETECTED Final   Neisseria meningitidis NOT DETECTED NOT DETECTED Final   Pseudomonas aeruginosa NOT DETECTED NOT DETECTED Final   Candida albicans NOT DETECTED NOT DETECTED Final   Candida glabrata NOT DETECTED NOT DETECTED Final   Candida krusei NOT DETECTED NOT DETECTED Final   Candida parapsilosis NOT DETECTED NOT DETECTED Final   Candida tropicalis NOT DETECTED NOT DETECTED Final     Comment: Performed at [facility], [address], [place], [place] [address]  Culture, blood (routine x 2)     Status: None (Preliminary result)   Collection Time: 07/08/19 11:30 AM   Specimen: BLOOD RIGHT HAND  Result Value Ref Range Status   Specimen Description BLOOD RIGHT HAND  Final   Special Requests   Final    BOTTLES DRAWN AEROBIC ONLY Blood Culture adequate volume   Culture   Final    NO GROWTH 2 DAYS Performed at [facility], [address] [place], [place] [address]    Report Status PENDING  Incomplete  SARS Coronavirus 2 (CEPHEID - Performed in [facility] lab), Hosp Order     Status: None   Collection Time: 07/08/19 11:34 AM   Specimen: Nasopharyngeal Swab  Result Value Ref Range Status   SARS Coronavirus 2 NEGATIVE NEGATIVE Final    Comment: (NOTE) If result is NEGATIVE SARS-CoV-2 target nucleic acids are NOT DETECTED. The SARS-CoV-2 RNA is generally detectable in upper and lower  respiratory specimens during the acute phase of infection. The lowest  concentration of SARS-CoV-2 viral copies this assay can detect is 250  copies / mL. A negative result does not preclude SARS-CoV-2 infection  and should not be used as the sole basis for treatment or other  patient management decisions.  A negative result may occur with  improper specimen collection / handling, submission of specimen other  than nasopharyngeal swab, presence of viral mutation(s) within the  areas targeted by this assay, and inadequate number of viral copies  (<250 copies / mL). A negative result must be combined with clinical  observations, patient history, and epidemiological information. If result is POSITIVE SARS-CoV-2 target nucleic acids are DETECTED. The SARS-CoV-2 RNA is generally detectable in upper and lower  respiratory specimens dur ing the acute phase of infection.  Positive  results are indicative of active infection with SARS-CoV-2.  Clinical  correlation with  patient history and other diagnostic information is  necessary to determine patient infection status.  Positive results do  not rule out bacterial infection or co-infection with other viruses. If result is PRESUMPTIVE POSTIVE SARS-CoV-2 nucleic acids MAY BE PRESENT.   A presumptive positive result was obtained on the submitted specimen  and confirmed on repeat testing.  While 2019 novel coronavirus  (SARS-CoV-2) nucleic acids may be present in the submitted sample  additional confirmatory testing may be necessary for epidemiological  and / or clinical management purposes  to differentiate between  SARS-CoV-2 and other Sarbecovirus currently known to infect humans.  If clinically indicated additional testing with an alternate test  methodology [phone]) is advised. The SARS-CoV-2 RNA is generally  detectable in upper and lower respiratory sp ecimens during the acute  phase of infection. The expected result is Negative. Fact Sheet for Patients:  StrictlyIdeas.no Fact Sheet for Healthcare Providers: BankingDealers.co.za This test is not yet approved or cleared by the [place] FDA and has been authorized for detection and/or diagnosis of SARS-CoV-2 by FDA under an Emergency Use Authorization (EUA).  This EUA will remain in effect (meaning this test can be used) for the duration of the COVID-19 declaration under Section 564(b)(1) of the Act, 21 U.S.C. section 360bbb-3(b)(1), unless the authorization is terminated or revoked sooner. Performed at [facility], [place] [address], [place], [place] [address]   Urine culture     Status: None   Collection Time: 07/08/19 12:32 PM   Specimen: Urine, Clean Catch  Result Value Ref Range Status   Specimen Description URINE, CLEAN CATCH  Final   Special Requests NONE  Final   Culture   Final    NO GROWTH Performed at [facility], [place] [address] [place], [place] [address]    Report  Status 07/09/2019 FINAL  Final         Radiology Studies: No results found.      Scheduled Meds: . allopurinol  300 mg Oral Daily  . atorvastatin  20 mg Oral Daily  .  ferrous sulfate  325 mg Oral Q M,W,F  . fluticasone furoate-vilanterol  1 puff Inhalation Daily  . folic acid  1 mg Oral Daily  . heparin  5,000 Units Subcutaneous Q8H  . insulin aspart  0-15 Units Subcutaneous TID WC  . mouth rinse  15 mL Mouth Rinse BID  . pantoprazole  40 mg Oral BID AC  . QUEtiapine  25 mg Oral QHS  . senna-docusate  1 tablet Oral QHS  . sucralfate  1 g Oral 5 X Daily   Continuous Infusions: . sodium chloride Stopped (07/10/19 1530)  . lactated ringers Stopped (07/11/19 0300)     LOS: 3 days     Cordelia Poche, MD [facility] 07/11/2019, 1:30 PM  If 7PM-7AM, please contact night-coverage [email or web address]

## 2019-07-12 ENCOUNTER — Inpatient Hospital Stay (HOSPITAL_COMMUNITY): Payer: Medicare Other

## 2019-07-12 LAB — GLUCOSE, CAPILLARY
Glucose-Capillary: 90 mg/dL (ref 70–99)
Glucose-Capillary: 130 mg/dL — ABNORMAL HIGH (ref 70–99)
Glucose-Capillary: 84 mg/dL (ref 70–99)
Glucose-Capillary: 111 mg/dL — ABNORMAL HIGH (ref 70–99)

## 2019-07-12 LAB — BASIC METABOLIC PANEL
Anion gap: 11 (ref 5–15)
BUN: 14 mg/dL (ref 8–23)
CO2: 24 mmol/L (ref 22–32)
Calcium: 7.7 mg/dL — ABNORMAL LOW (ref 8.9–10.3)
Chloride: 110 mmol/L (ref 98–111)
Creatinine, Ser: 1.53 mg/dL — ABNORMAL HIGH (ref 0.61–1.24)
GFR calc Af Amer: 51 mL/min — ABNORMAL LOW (ref >60)
GFR calc non Af Amer: 44 mL/min — ABNORMAL LOW (ref >60)
Glucose, Bld: 83 mg/dL (ref 70–99)
Potassium: 3.3 mmol/L — ABNORMAL LOW (ref 3.5–5.1)
Sodium: 145 mmol/L (ref 135–145)

## 2019-07-12 LAB — MAGNESIUM: Magnesium: 1.3 mg/dL — ABNORMAL LOW (ref 1.7–2.4)

## 2019-07-12 MED ORDER — POTASSIUM CHLORIDE CRYS ER 20 MEQ PO TBCR
40.0000 meq | EXTENDED_RELEASE_TABLET | Freq: Once | ORAL | Status: AC
  Administered 2019-07-12: 40 meq via ORAL
  Filled 2019-07-12: qty 2

## 2019-07-12 MED ORDER — BISOPROLOL FUMARATE 5 MG PO TABS
5.0000 mg | ORAL_TABLET | Freq: Every day | ORAL | Status: DC
  Administered 2019-07-12 – 2019-07-16 (×5): 5 mg via ORAL
  Filled 2019-07-12 (×5): qty 1

## 2019-07-12 MED ORDER — MAGNESIUM SULFATE 4 GM/100ML IV SOLN
4.0000 g | Freq: Once | INTRAVENOUS | Status: DC
  Filled 2019-07-12: qty 100

## 2019-07-12 MED ORDER — MAGNESIUM OXIDE 400 (241.3 MG) MG PO TABS
800.0000 mg | ORAL_TABLET | Freq: Two times a day (BID) | ORAL | Status: AC
  Administered 2019-07-12 (×2): 800 mg via ORAL
  Filled 2019-07-12 (×2): qty 2

## 2019-07-12 MED ORDER — AMLODIPINE BESYLATE 10 MG PO TABS
10.0000 mg | ORAL_TABLET | Freq: Every day | ORAL | Status: DC
  Administered 2019-07-12 – 2019-07-16 (×5): 10 mg via ORAL
  Filled 2019-07-12 (×5): qty 1

## 2019-07-12 NOTE — Progress Notes (Signed)
PROGRESS NOTE    John [name]  ZJI:[id number] DOB: Apr 01, 1946 DOA: 07/08/2019 PCP: [name], NP   Brief Narrative: John [name] is a 73 y.o. 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  Patient presented secondary to altered mental status and found to have hypotension, acute kidney injury, electrolyte abnormalities.   Assessment & Plan:   Principal Problem:   Acute renal failure (ARF) (HCC) Active Problems:   Chronic diastolic CHF (congestive heart failure) (HCC)   HTN (hypertension)   COPD II/III with reversibility    Primary cancer of right lower lobe of lung (HCC)   CKD (chronic kidney disease), stage III (HCC)   Hypotension   Vascular dementia without behavioral disturbance (HCC)   Hypokalemia   AKI on CKD stage III Baseline creatinine of about 1. Worsening of creatinine up to 4.54 on admission and trending down slowly close to baseline with creatinine of 1.53 today. Likely worsened by NSAID use in addition to diuretics. -Will hold IV fluids for now as IV has been displaced. Encourage oral intake. If worsening AKI, will need to restart IV and IV fluids -Daily BMP  Hypotension Resolved. Home medications held for now -IV fluids as mentioned above  Essential hypertension Elevated now. -Restart amlodipine and bisoprolol  Leukocytosis Unknown etiology.  No obvious source for infection.  Patient did arrive with hypotension.  Sepsis mentioned in H&P, however no evidence of sepsis from my assessment.  Patient does have blood cultures obtained with results of 1 out of 4 bottles significant for gram variable rod which is concerning for contamination.  Urine culture with no growth. -Follow blood cultures -Daily CBC -Repeat blood cultures  Confusion Associated agitation and a history of dementia.  Likely  delirium. Improved but waxes and wanes -Continue Seroquel, prescribe on discharge  Hypernatremia Mild. -Encourage oral intake  Hypokalemia Hypomagnesemia Continues to be low -Replete as needed with PO potassium and IV magnesium -Telemetry (patient refusing) -Repeat BMP and mag in AM; switch to magnesium oxide today since IV displaced  Diabetes mellitus -Continue SSI  Chronic diastolic heart failure Currently compensated  COPD Right lower lobe lung carcinoma Patient is on chronic oxygen per chart review.  For cancer, appears patient was scheduled for biopsy last week.  Patient will need to follow-up as an outpatient. -Continue Breo Ellipta, Symbicort, O2 as needed  Dementia Patient appears to be suffering from hospital associated delirium.  Management above.  Dyspnea Earlier this morning. Improved now. Placed on oxygen but unsure if actual desaturations. Wheezing on exam. History of COPD -Continue breathing treatments above -Chest x-ray to ensure no pulmonary edema   DVT prophylaxis: Heparin subcutaneous Code Status:   Code Status: Full Code Family Communication: None Disposition Plan: Discharge pending continued medical management, likely home in 24 hours if electrolyte disturbances resolved.   Consultants:   None  Procedures:   None  Antimicrobials:  Vancomycin (7/9)  Cefepime (7/9)   Subjective: No issues overnight but had some shortness of breath this morning.  Objective: Vitals:   07/11/19 2100 07/11/19 2100 07/12/19 0524 07/12/19 0848  BP: (!) 154/87 (!) 154/87 (!) 155/78   Pulse: 96 96 (!) 107 (!) 104  Resp: 18 18 (!) 22 20  Temp: 99 F (37.2 C) 99 F (37.2 C) 97.9 F (36.6 C)   TempSrc: Oral Oral Oral   SpO2: 98% 98% 98% 97%  Weight:   84.6  kg   Height:        Intake/Output Summary (Last 24 hours) at 07/12/2019 0919 Last data filed at 07/12/2019 0500 Gross per 24 hour  Intake 360 ml  Output 1075 ml  Net -715 ml   Filed Weights    07/10/19 0500 07/11/19 0121 07/12/19 0524  Weight: 86.5 kg 86.9 kg 84.6 kg    Examination:  General exam: Appears calm and comfortable Respiratory system: Mild wheezes. Respiratory effort normal. Cardiovascular system: S1 & S2 heard, RRR. No murmurs, rubs, gallops or clicks. Gastrointestinal system: Abdomen is nondistended, soft and nontender. No organomegaly or masses felt. Normal bowel sounds heard. Central nervous system: Alert. No focal neurological deficits. Extremities: No edema. No calf tenderness Skin: No cyanosis. No rashes Psychiatry: Judgement and insight appear normal. Pleasant.    Data Reviewed: I have personally reviewed following labs and imaging studies  CBC: Recent Labs  Lab 07/08/19 1037 07/09/19 0117 07/10/19 0732 07/11/19 0238  WBC 23.5* 24.2* 21.9* 18.1*  NEUTROABS 21.6*  --   --   --   HGB 10.4* 9.1* 9.3* 8.6*  HCT 32.1* 27.8* 29.1* 26.4*  MCV 93.6 92.4 93.0 91.7  PLT 198 181 198 540   Basic Metabolic Panel: Recent Labs  Lab 07/08/19 1849 07/09/19 0117  07/09/19 1716 07/10/19 0732 07/10/19 1829 07/11/19 0238 07/12/19 0343  NA 143 142  --   --  144  --  146* 145  K 2.8* 2.3*   < > 3.2* 2.8* 3.1* 3.4* 3.3*  CL 108 109  --   --  109  --  113* 110  CO2 18* 20*  --   --  23  --  24 24  GLUCOSE 86 92  --   --  117*  --  123* 83  BUN 40* 40*  --   --  37*  --  28* 14  CREATININE 4.18* 4.04*  --   --  2.90*  --  2.08* 1.53*  CALCIUM 7.2* 7.2*  --   --  7.9*  --  8.1* 7.7*  MG  --  1.1*  --  1.6* 1.5* 1.5*  --  1.3*   < > = values in this interval not displayed.   GFR: Estimated Creatinine Clearance: 44 mL/min (A) (by C-G formula based on SCr of 1.53 mg/dL (H)). Liver Function Tests: Recent Labs  Lab 07/08/19 1037  AST 16  ALT 10  ALKPHOS 61  BILITOT 1.3*  PROT 6.7  ALBUMIN 3.3*   Recent Labs  Lab 07/08/19 1037  LIPASE 19   No results for input(s): AMMONIA in the last 168 hours. Coagulation Profile: Recent Labs  Lab 07/08/19  1220 07/09/19 0117  INR 1.4* 1.4*   Cardiac Enzymes: No results for input(s): CKTOTAL, CKMB, CKMBINDEX, TROPONINI in the last 168 hours. BNP (last 3 results) No results for input(s): PROBNP in the last 8760 hours. HbA1C: No results for input(s): HGBA1C in the last 72 hours. CBG: Recent Labs  Lab 07/11/19 0633 07/11/19 1149 07/11/19 1655 07/11/19 2101 07/12/19 0627  GLUCAP 123* 99 97 94 90   Lipid Profile: No results for input(s): CHOL, HDL, LDLCALC, TRIG, CHOLHDL, LDLDIRECT in the last 72 hours. Thyroid Function Tests: No results for input(s): TSH, T4TOTAL, FREET4, T3FREE, THYROIDAB in the last 72 hours. Anemia Panel: No results for input(s): VITAMINB12, FOLATE, FERRITIN, TIBC, IRON, RETICCTPCT in the last 72 hours. Sepsis Labs: Recent Labs  Lab 07/08/19 1053 07/08/19 1222 07/08/19 1906 07/09/19 0117  LATICACIDVEN 2.1*  2.1* 3.9* 2.9*    Recent Results (from the past 240 hour(s))  SARS Coronavirus 2 (Performed in [facility] hospital lab)     Status: None   Collection Time: 07/05/19  3:50 PM  Result Value Ref Range Status   SARS Coronavirus 2 NEGATIVE NEGATIVE Final    Comment: (NOTE) SARS-CoV-2 target nucleic acids are NOT DETECTED. The SARS-CoV-2 RNA is generally detectable in upper and lower respiratory specimens during the acute phase of infection. Negative results do not preclude SARS-CoV-2 infection, do not rule out co-infections with other pathogens, and should not be used as the sole basis for treatment or other patient management decisions. Negative results must be combined with clinical observations, patient history, and epidemiological information. The expected result is Negative. Fact Sheet for Patients: SugarRoll.be Fact Sheet for Healthcare Providers: https://www.woods-mathews.com/ This test is not yet approved or cleared by the [place] FDA and  has been authorized for detection and/or diagnosis of  SARS-CoV-2 by FDA under an Emergency Use Authorization (EUA). This EUA will remain  in effect (meaning this test can be used) for the duration of the COVID-19 declaration under Section 56 4(b)(1) of the Act, 21 U.S.C. section 360bbb-3(b)(1), unless the authorization is terminated or revoked sooner. Performed at [facility], [place] [address], [place], [place] [address]   Culture, blood (routine x 2)     Status: Abnormal   Collection Time: 07/08/19 10:53 AM   Specimen: BLOOD  Result Value Ref Range Status   Specimen Description BLOOD RIGHT ANTECUBITAL  Final   Special Requests   Final    BOTTLES DRAWN AEROBIC AND ANAEROBIC Blood Culture adequate volume   Culture  Setup Time (A)  Final    GRAM VARIABLE ROD ANAEROBIC BOTTLE ONLY CRITICAL RESULT CALLED TO, READ BACK BY AND VERIFIED WITH: Susy Manor [id number] [place] Performed at [facility], [address] [place], [place] [address]    Culture BACILLUS SPECIES (A)  Final   Report Status 07/10/2019 FINAL  Final  Blood Culture ID Panel (Reflexed)     Status: None   Collection Time: 07/08/19 10:53 AM  Result Value Ref Range Status   Enterococcus species NOT DETECTED NOT DETECTED Final    Comment: CRITICAL RESULT CALLED TO, READ BACK BY AND VERIFIED WITH: PHARMD JEREMY FRENS [id number] FCP    Listeria monocytogenes NOT DETECTED NOT DETECTED Final   Staphylococcus species NOT DETECTED NOT DETECTED Final   Staphylococcus aureus (BCID) NOT DETECTED NOT DETECTED Final   Streptococcus species NOT DETECTED NOT DETECTED Final   Streptococcus agalactiae NOT DETECTED NOT DETECTED Final   Streptococcus pneumoniae NOT DETECTED NOT DETECTED Final   Streptococcus pyogenes NOT DETECTED NOT DETECTED Final   Acinetobacter baumannii NOT DETECTED NOT DETECTED Final   Enterobacteriaceae species NOT DETECTED NOT DETECTED Final   Enterobacter cloacae complex NOT DETECTED NOT DETECTED Final   Escherichia coli NOT DETECTED NOT DETECTED  Final   Klebsiella oxytoca NOT DETECTED NOT DETECTED Final   Klebsiella pneumoniae NOT DETECTED NOT DETECTED Final   Proteus species NOT DETECTED NOT DETECTED Final   Serratia marcescens NOT DETECTED NOT DETECTED Final   Haemophilus influenzae NOT DETECTED NOT DETECTED Final   Neisseria meningitidis NOT DETECTED NOT DETECTED Final   Pseudomonas aeruginosa NOT DETECTED NOT DETECTED Final   Candida albicans NOT DETECTED NOT DETECTED Final   Candida glabrata NOT DETECTED NOT DETECTED Final   Candida krusei NOT DETECTED NOT DETECTED Final   Candida parapsilosis NOT  DETECTED NOT DETECTED Final   Candida tropicalis NOT DETECTED NOT DETECTED Final    Comment: Performed at [facility], [place] [address] [place], [place] [address]  Culture, blood (routine x 2)     Status: None (Preliminary result)   Collection Time: 07/08/19 11:30 AM   Specimen: BLOOD RIGHT HAND  Result Value Ref Range Status   Specimen Description BLOOD RIGHT HAND  Final   Special Requests   Final    BOTTLES DRAWN AEROBIC ONLY Blood Culture adequate volume   Culture   Final    NO GROWTH 3 DAYS Performed at [facility], [place] [address] [place], [place] [address]    Report Status PENDING  Incomplete  SARS Coronavirus 2 (CEPHEID - Performed in [facility] hospital lab), Hosp Order     Status: None   Collection Time: 07/08/19 11:34 AM   Specimen: Nasopharyngeal Swab  Result Value Ref Range Status   SARS Coronavirus 2 NEGATIVE NEGATIVE Final    Comment: (NOTE) If result is NEGATIVE SARS-CoV-2 target nucleic acids are NOT DETECTED. The SARS-CoV-2 RNA is generally detectable in upper and lower  respiratory specimens during the acute phase of infection. The lowest  concentration of SARS-CoV-2 viral copies this assay can detect is 250  copies / mL. A negative result does not preclude SARS-CoV-2 infection  and should not be used as the sole basis for treatment or other  patient management decisions.  A negative  result may occur with  improper specimen collection / handling, submission of specimen other  than nasopharyngeal swab, presence of viral mutation(s) within the  areas targeted by this assay, and inadequate number of viral copies  (<250 copies / mL). A negative result must be combined with clinical  observations, patient history, and epidemiological information. If result is POSITIVE SARS-CoV-2 target nucleic acids are DETECTED. The SARS-CoV-2 RNA is generally detectable in upper and lower  respiratory specimens dur ing the acute phase of infection.  Positive  results are indicative of active infection with SARS-CoV-2.  Clinical  correlation with patient history and other diagnostic information is  necessary to determine patient infection status.  Positive results do  not rule out bacterial infection or co-infection with other viruses. If result is PRESUMPTIVE POSTIVE SARS-CoV-2 nucleic acids MAY BE PRESENT.   A presumptive positive result was obtained on the submitted specimen  and confirmed on repeat testing.  While 2019 novel coronavirus  (SARS-CoV-2) nucleic acids may be present in the submitted sample  additional confirmatory testing may be necessary for epidemiological  and / or clinical management purposes  to differentiate between  SARS-CoV-2 and other Sarbecovirus currently known to infect humans.  If clinically indicated additional testing with an alternate test  methodology [phone]) is advised. The SARS-CoV-2 RNA is generally  detectable in upper and lower respiratory sp ecimens during the acute  phase of infection. The expected result is Negative. Fact Sheet for Patients:  StrictlyIdeas.no Fact Sheet for Healthcare Providers: BankingDealers.co.za This test is not yet approved or cleared by the [place] FDA and has been authorized for detection and/or diagnosis of SARS-CoV-2 by FDA under an Emergency Use Authorization  (EUA).  This EUA will remain in effect (meaning this test can be used) for the duration of the COVID-19 declaration under Section 564(b)(1) of the Act, 21 U.S.C. section 360bbb-3(b)(1), unless the authorization is terminated or revoked sooner. Performed at [facility], [place] [address], [place], [place] [address]   Urine culture  Status: None   Collection Time: 07/08/19 12:32 PM   Specimen: Urine, Clean Catch  Result Value Ref Range Status   Specimen Description URINE, CLEAN CATCH  Final   Special Requests NONE  Final   Culture   Final    NO GROWTH Performed at [facility], [place] [address], [place], [place] [address]    Report Status 07/09/2019 FINAL  Final  Culture, blood (routine x 2)     Status: None (Preliminary result)   Collection Time: 07/10/19  1:25 PM   Specimen: BLOOD  Result Value Ref Range Status   Specimen Description BLOOD RIGHT ANTECUBITAL  Final   Special Requests   Final    BOTTLES DRAWN AEROBIC ONLY Blood Culture adequate volume   Culture   Final    NO GROWTH 1 DAY Performed at [facility], [place] [address] [place], [place] [address]    Report Status PENDING  Incomplete  Culture, blood (routine x 2)     Status: None (Preliminary result)   Collection Time: 07/10/19  6:17 PM   Specimen: BLOOD LEFT HAND  Result Value Ref Range Status   Specimen Description BLOOD LEFT HAND  Final   Special Requests   Final    BOTTLES DRAWN AEROBIC AND ANAEROBIC Blood Culture results may not be optimal due to an inadequate volume of blood received in culture bottles   Culture   Final    NO GROWTH < 24 HOURS Performed at [facility], [place] [address], [place], [place] [address]    Report Status PENDING  Incomplete         Radiology Studies: Dg Chest Port 1 View  Result Date: 07/12/2019 CLINICAL DATA:  Wheezing and shortness of breath. EXAM: PORTABLE CHEST 1 VIEW COMPARISON:  Chest CT April 22nd 2020 FINDINGS: Cardiomediastinal silhouette is  normal. Mediastinal contours appear intact. Right pulmonary masses are not significantly changed from the prior studies. Right pleural thickening versus small pleural effusion. Bibasilar atelectasis. Osseous structures are without acute abnormality. Soft tissues are grossly normal. IMPRESSION: 1. Right pulmonary masses are not significantly changed from the prior studies. 2. Right pleural thickening versus small pleural effusion. 3. Bibasilar atelectasis. Electronically Signed   By: Fidela Salisbury M.D.   On: 07/12/2019 09:11        Scheduled Meds: . allopurinol  300 mg Oral Daily  . atorvastatin  20 mg Oral Daily  . ferrous sulfate  325 mg Oral Q M,W,F  . fluticasone furoate-vilanterol  1 puff Inhalation Daily  . folic acid  1 mg Oral Daily  . heparin  5,000 Units Subcutaneous Q8H  . insulin aspart  0-15 Units Subcutaneous TID WC  . magnesium oxide  800 mg Oral BID  . mouth rinse  15 mL Mouth Rinse BID  . pantoprazole  40 mg Oral BID AC  . potassium chloride  40 mEq Oral Once  . QUEtiapine  25 mg Oral BID  . senna-docusate  1 tablet Oral QHS  . sucralfate  1 g Oral 5 X Daily   Continuous Infusions: . sodium chloride Stopped (07/10/19 1530)  . lactated ringers Stopped (07/11/19 0300)     LOS: 4 days     Cordelia Poche, MD [facility] 07/12/2019, 9:19 AM  If 7PM-7AM, please contact night-coverage [email or web address]

## 2019-07-12 NOTE — Evaluation (Signed)
Physical Therapy Evaluation Patient Details Name: John [name] MRN: [id number] DOB: 1946-06-13 Today's Date: 07/12/2019   History of Present Illness  73 yo admitted with AMS and AKI. PMhx: CAD, COPD on home O2, small cell lung CA, CHF, HTN, HLD, dementia, DM  Clinical Impression  Pt pleasant supine on arrival stating he sat up in chair most of the morning. Pt noted to have SpO2 85% on 3L with need for 5L to obtain 91% and 6L with gait with drop to 87% on 6L, 90% on 4L at rest end of session. Pt with decreased functional mobility, balance, gait and independence who will benefit from acute therapy to maximize activity tolerance and function with improved pulmonary function to decrease burden of care.     Follow Up Recommendations Home health PT    Equipment Recommendations  Rolling walker with 5" wheels    Recommendations for Other Services       Precautions / Restrictions Precautions Precautions: Fall Precaution Comments: watch O2 Restrictions Weight Bearing Restrictions: No      Mobility  Bed Mobility Overal bed mobility: Modified Independent Bed Mobility: Supine to Sit           General bed mobility comments: pt able to transition to EOb without physical assist  Transfers Overall transfer level: Needs assistance   Transfers: Sit to/from Stand Sit to Stand: Supervision         General transfer comment: cues for hand placement  Ambulation/Gait Ambulation/Gait assistance: Min guard Gait Distance (Feet): 40 Feet Assistive device: Rolling walker (2 wheeled) Gait Pattern/deviations: Step-through pattern;Decreased stride length;Trunk flexed   Gait velocity interpretation: >2.62 ft/sec, indicative of community ambulatory General Gait Details: cues for posture and position in RW  Stairs            Wheelchair Mobility    Modified Rankin (Stroke Patients Only)       Balance Overall balance assessment: Needs assistance Sitting-balance support: No  upper extremity supported;Feet supported Sitting balance-Leahy Scale: Good     Standing balance support: Bilateral upper extremity supported;During functional activity Standing balance-Leahy Scale: Poor Standing balance comment: reliant on B UE support using RW                              Pertinent Vitals/Pain Pain Assessment: No/denies pain    Home Living Family/patient expects to be discharged to:: Private residence Living Arrangements: Children(daughter) Available Help at Discharge: Family;Available 24 hours/day(reports daughter) Type of Home: House Home Access: Stairs to enter   [employer] of Steps: 1 Home Layout: One level Home Equipment: Cane - single point;Crutches;Bedside commode      Prior Function Level of Independence: Independent with assistive device(s)         Comments: reports using cane in AM (until he "loosens up"), independent ADLs, no IADLs (daughter assist) . doesn't walk long distance just to his porch or mailbox     Hand Dominance   Dominant Hand: Right    Extremity/Trunk Assessment   Upper Extremity Assessment Upper Extremity Assessment: Generalized weakness    Lower Extremity Assessment Lower Extremity Assessment: Generalized weakness    Cervical / Trunk Assessment Cervical / Trunk Assessment: Kyphotic  Communication   Communication: No difficulties  Cognition Arousal/Alertness: Awake/alert Behavior During Therapy: WFL for tasks assessed/performed Overall Cognitive Status: Impaired/Different from baseline Area of Impairment: Orientation;Memory;Safety/judgement;Problem solving                 Orientation Level:  Time;Situation   Memory: Decreased short-term memory   Safety/Judgement: Decreased awareness of deficits   Problem Solving: Slow processing General Comments: pt stating he has assist all day then stating daughter works      General Comments      Exercises     Assessment/Plan    PT  Assessment Patient needs continued PT services  PT Problem List Decreased mobility;Decreased activity tolerance;Decreased balance;Decreased knowledge of use of DME;Decreased strength       PT Treatment Interventions Gait training;Therapeutic exercise;Patient/family education;Functional mobility training;Balance training;Stair training;DME instruction;Therapeutic activities    PT Goals (Current goals can be found in the Care Plan section)  Acute Rehab PT Goals Patient Stated Goal: return home PT Goal Formulation: With patient Time For Goal Achievement: 07/26/19 Potential to Achieve Goals: Good    Frequency Min 3X/week   Barriers to discharge Decreased caregiver support      Co-evaluation               AM-PAC PT "6 Clicks" Mobility  Outcome Measure Help needed turning from your back to your side while in a flat bed without using bedrails?: None Help needed moving from lying on your back to sitting on the side of a flat bed without using bedrails?: None Help needed moving to and from a bed to a chair (including a wheelchair)?: A Little Help needed standing up from a chair using your arms (e.g., wheelchair or bedside chair)?: A Little Help needed to walk in hospital room?: A Little Help needed climbing 3-5 steps with a railing? : A Little 6 Click Score: 20    End of Session Equipment Utilized During Treatment: Gait belt Activity Tolerance: Patient tolerated treatment well Patient left: in bed;with call bell/phone within reach;with bed alarm set Nurse Communication: Mobility status PT Visit Diagnosis: Other abnormalities of gait and mobility (R26.89);Muscle weakness (generalized) (M62.81);Difficulty in walking, not elsewhere classified (R26.2)    Time: 3893-7342 PT Time Calculation (min) (ACUTE ONLY): 16 min   Charges:   PT Evaluation $PT Eval Moderate Complexity: 1 Mod          [place], PT Acute Rehabilitation Services Pager: [phone] Office:  [phone]   Mico Spark B Destanee Bedonie 07/12/2019, 1:01 PM

## 2019-07-12 NOTE — Care Management Important Message (Signed)
Important Message  Patient Details  Name: John [name] MRN: [id number] Date of Birth: 07-31-1946   Medicare Important Message Given:  Yes     [name] 07/12/2019, 2:25 PM

## 2019-07-12 NOTE — Evaluation (Signed)
Occupational Therapy Evaluation Patient Details Name: John [name] MRN: [id number] DOB: Jan 10, 1946 Today's Date: 07/12/2019    History of Present Illness 73 yo admitted with AMS and AKI. PMhx: CAD, COPD on home O2, small cell lung CA, CHF, HTN, HLD, dementia, DM   Clinical Impression   PTA patient reports living with his daughter who provides 24/7 support, completing ADLs and mobility using cane as needed with independence, daughter completes IADLs.  Admitted for above and limited by problem list below, including impaired balance, decreased activity tolerance, and generalized weakness.  He currently requires supervision for bed mobility, min guard for transfers using RW, min assist for LB ADLs.  He initially becomes dizzy upon standing but fades.  3L supplemental oxygen via [place] with saturations dropping to 82% with mobility, increased to 4L to recover then bumped back down to 3L at 90-92 supine in bed. Will follow acutely, recommend continued OT services after dcat HHOT level in order to maximize independence and safety with ADLs/mobility.     Follow Up Recommendations  Home health OT;Supervision/Assistance - 24 hour    Equipment Recommendations  None recommended by OT    Recommendations for Other Services PT consult     Precautions / Restrictions Precautions Precautions: Fall Precaution Comments: watch O2 Restrictions Weight Bearing Restrictions: No      Mobility Bed Mobility Overal bed mobility: Needs Assistance Bed Mobility: Supine to Sit;Sit to Supine     Supine to sit: Supervision Sit to supine: Supervision   General bed mobility comments: supervision for safety, no assist required   Transfers Overall transfer level: Needs assistance Equipment used: Rolling walker (2 wheeled) Transfers: Sit to/from Stand Sit to Stand: Min guard         General transfer comment: min guard sit to stand from EOB x 2, inital dizziness but resolved    Balance Overall balance  assessment: Needs assistance Sitting-balance support: No upper extremity supported;Feet supported Sitting balance-Leahy Scale: Good Sitting balance - Comments: engage in ADLs without support   Standing balance support: Bilateral upper extremity supported;During functional activity Standing balance-Leahy Scale: Poor Standing balance comment: reliant on B UE support using RW                            ADL either performed or assessed with clinical judgement   ADL Overall ADL's : Needs assistance/impaired     Grooming: Set up;Sitting   Upper Body Bathing: Set up;Sitting   Lower Body Bathing: Minimal assistance;Sit to/from stand   Upper Body Dressing : Minimal assistance;Sitting   Lower Body Dressing: Minimal assistance;Sit to/from stand   Toilet Transfer: Min guard;Ambulation;RW Toilet Transfer Details (indicate cue type and reason): simulated in room Toileting- Clothing Manipulation and Hygiene: Set up;Supervision/safety;Sitting/lateral lean Toileting - Clothing Manipulation Details (indicate cue type and reason): sitting using urinal     Functional mobility during ADLs: Min guard;Rolling walker General ADL Comments: pt limited by decreased activity tolerance, generalized weakness      Vision         Perception     Praxis      Pertinent Vitals/Pain Pain Assessment: No/denies pain     Hand Dominance Right   Extremity/Trunk Assessment Upper Extremity Assessment Upper Extremity Assessment: Generalized weakness   Lower Extremity Assessment Lower Extremity Assessment: Defer to PT evaluation       Communication Communication Communication: No difficulties   Cognition Arousal/Alertness: Awake/alert Behavior During Therapy: WFL for tasks assessed/performed Overall Cognitive Status:  History of cognitive impairments - at baseline                                 General Comments: pt oriented to person and place, but not time (reports 2020  only) and situation; able to follow commands and engage appropraitely    General Comments  pt on 3L supplemental oxgyen via [employer], noted dropped to 82% with mobility and increased to 4L to recover; on 3L at completion of session with O2 90-93%    Exercises     Shoulder Instructions      Home Living Family/patient expects to be discharged to:: Private residence Living Arrangements: Children(daughter) Available Help at Discharge: Family;Available 24 hours/day(reports daughter) Type of Home: House Home Access: Stairs to enter [employer] of Steps: 1   Home Layout: One level     Bathroom Shower/Tub: Occupational psychologist: Standard     Home Equipment: Cane - single point;Crutches;Bedside commode          Prior Functioning/Environment Level of Independence: Independent with assistive device(s)        Comments: reports using cane in AM (until he "loosens up"), independent ADLs, no IADLs (daughter assist)         OT Problem List: Decreased strength;Decreased activity tolerance;Impaired balance (sitting and/or standing);Decreased safety awareness;Decreased cognition;Decreased knowledge of use of DME or AE;Decreased knowledge of precautions;Cardiopulmonary status limiting activity      OT Treatment/Interventions: Self-care/ADL training;Therapeutic exercise;DME and/or AE instruction;Therapeutic activities;Patient/family education;Balance training    OT Goals(Current goals can be found in the care plan section) Acute Rehab OT Goals Patient Stated Goal: to lay back down OT Goal Formulation: With patient Time For Goal Achievement: 07/26/19 Potential to Achieve Goals: Good  OT Frequency: Min 2X/week   Barriers to D/C:            Co-evaluation              AM-PAC OT "6 Clicks" Daily Activity     Outcome Measure Help from another person eating meals?: A Little Help from another person taking care of personal grooming?: A Little Help from another  person toileting, which includes using toliet, bedpan, or urinal?: A Little Help from another person bathing (including washing, rinsing, drying)?: A Little Help from another person to put on and taking off regular upper body clothing?: A Little Help from another person to put on and taking off regular lower body clothing?: A Little 6 Click Score: 18   End of Session Equipment Utilized During Treatment: Rolling walker;Gait belt;Oxygen Nurse Communication: Mobility status  Activity Tolerance: Patient tolerated treatment well Patient left: in bed;with call bell/phone within reach;with bed alarm set  OT Visit Diagnosis: Unsteadiness on feet (R26.81);Muscle weakness (generalized) (M62.81)                Time: 3568-6168 OT Time Calculation (min): 22 min Charges:  OT General Charges $OT Visit: 1 Visit OT Evaluation $OT Eval Moderate Complexity: [place], OT Acute Rehabilitation Services Pager [phone] Office [phone]   Delight Stare 07/12/2019, 9:03 AM

## 2019-07-12 NOTE — Plan of Care (Signed)
  Problem: Activity: Goal: Risk for activity intolerance will decrease Outcome: Progressing   Problem: Safety: Goal: Ability to remain free from injury will improve Outcome: Progressing   

## 2019-07-13 LAB — GLUCOSE, CAPILLARY
Glucose-Capillary: 93 mg/dL (ref 70–99)
Glucose-Capillary: 122 mg/dL — ABNORMAL HIGH (ref 70–99)
Glucose-Capillary: 107 mg/dL — ABNORMAL HIGH (ref 70–99)
Glucose-Capillary: 112 mg/dL — ABNORMAL HIGH (ref 70–99)

## 2019-07-13 LAB — CULTURE, BLOOD (ROUTINE X 2)
Culture: NO GROWTH
Special Requests: ADEQUATE

## 2019-07-13 LAB — BASIC METABOLIC PANEL
Anion gap: 11 (ref 5–15)
BUN: 9 mg/dL (ref 8–23)
CO2: 24 mmol/L (ref 22–32)
Calcium: 8.2 mg/dL — ABNORMAL LOW (ref 8.9–10.3)
Chloride: 111 mmol/L (ref 98–111)
Creatinine, Ser: 1.49 mg/dL — ABNORMAL HIGH (ref 0.61–1.24)
GFR calc Af Amer: 53 mL/min — ABNORMAL LOW (ref >60)
GFR calc non Af Amer: 46 mL/min — ABNORMAL LOW (ref >60)
Glucose, Bld: 102 mg/dL — ABNORMAL HIGH (ref 70–99)
Potassium: 3.5 mmol/L (ref 3.5–5.1)
Sodium: 146 mmol/L — ABNORMAL HIGH (ref 135–145)

## 2019-07-13 LAB — MAGNESIUM: Magnesium: 1.3 mg/dL — ABNORMAL LOW (ref 1.7–2.4)

## 2019-07-13 LAB — CBC
HCT: 26.7 % — ABNORMAL LOW (ref 39.0–52.0)
Hemoglobin: 8.5 g/dL — ABNORMAL LOW (ref 13.0–17.0)
MCH: 29.8 pg (ref 26.0–34.0)
MCHC: 31.8 g/dL (ref 30.0–36.0)
MCV: 93.7 fL (ref 80.0–100.0)
Platelets: 218 10*3/uL (ref 150–400)
RBC: 2.85 MIL/uL — ABNORMAL LOW (ref 4.22–5.81)
RDW: 18.6 % — ABNORMAL HIGH (ref 11.5–15.5)
WBC: 7.3 10*3/uL (ref 4.0–10.5)
nRBC: 0 % (ref 0.0–0.2)

## 2019-07-13 MED ORDER — ALBUTEROL SULFATE (2.5 MG/3ML) 0.083% IN NEBU
2.5000 mg | INHALATION_SOLUTION | Freq: Four times a day (QID) | RESPIRATORY_TRACT | Status: DC
  Administered 2019-07-13 – 2019-07-14 (×5): 2.5 mg via RESPIRATORY_TRACT
  Filled 2019-07-13 (×6): qty 3

## 2019-07-13 MED ORDER — MAGNESIUM OXIDE 400 (241.3 MG) MG PO TABS
800.0000 mg | ORAL_TABLET | Freq: Two times a day (BID) | ORAL | Status: DC
  Administered 2019-07-13 – 2019-07-16 (×7): 800 mg via ORAL
  Filled 2019-07-13 (×6): qty 2

## 2019-07-13 MED ORDER — FREE WATER
200.0000 mL | Freq: Four times a day (QID) | Status: DC
  Administered 2019-07-13 – 2019-07-16 (×12): 200 mL via ORAL

## 2019-07-13 NOTE — Progress Notes (Signed)
   07/13/19 1100  Mobility  Activity Ambulated in hall;Dangled on edge of bed  Range of Motion Active;All extremities  Level of Assistance Minimal assist, patient does 75% or more  Minutes Stood 5 minutes  Minutes Ambulated 5 minutes  Distance Ambulated (ft) 60 ft  Mobility Response Tolerated fair (Needed 3 standing rest breaks)  Bed Position Semi-fowlers   SATURATION QUALIFICATIONS: (This note is used to comply with regulatory documentation for home oxygen)  Patient Saturations on Room Air at Rest = 88%  Patient Saturations on Room Air while Ambulating = n/a%  Patient Saturations on 6 Liters of oxygen while Ambulating = 93%  Please briefly explain why patient needs home oxygen: Per request of RN, attempted to wean oxygen down. Patient wears 2L at rest, on 3L while ambulating the patient desaturated 85-83%, on 4L 88% and on 6L patient saturated well on 93%

## 2019-07-13 NOTE — Plan of Care (Signed)
  Problem: Clinical Measurements: Goal: Will remain free from infection Outcome: Progressing   Problem: Activity: Goal: Risk for activity intolerance will decrease Outcome: Progressing   Problem: Coping: Goal: Level of anxiety will decrease Outcome: Progressing   Problem: Safety: Goal: Ability to remain free from injury will improve Outcome: Progressing   

## 2019-07-13 NOTE — Progress Notes (Signed)
PROGRESS NOTE    John [name]  UUV:[id number] DOB: 07/01/1946 DOA: 07/08/2019 PCP: [name], NP   Brief Narrative: John [name] is a 73 y.o. 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  Patient presented secondary to altered mental status and found to have hypotension, acute kidney injury, electrolyte abnormalities.   Assessment & Plan:   Principal Problem:   Acute renal failure (ARF) (HCC) Active Problems:   Chronic diastolic CHF (congestive heart failure) (HCC)   HTN (hypertension)   COPD II/III with reversibility    Primary cancer of right lower lobe of lung (HCC)   CKD (chronic kidney disease), stage III (HCC)   Hypotension   Vascular dementia without behavioral disturbance (HCC)   Hypokalemia   AKI on CKD stage III Baseline creatinine of about 1. Worsening of creatinine up to 4.54 on admission and trending down slowly close to baseline with creatinine of 1.49 today. Likely worsened by NSAID use in addition to diuretics. -Will continue hold IV fluids for now as IV has been displaced. Encourage oral intake. If worsening AKI, will need to restart IV and IV fluids -Daily BMP  Hypotension Resolved. Home medications held for now -IV fluids as mentioned above  Essential hypertension Elevated now. -Restart amlodipine and bisoprolol  Leukocytosis Unknown etiology.  No obvious source for infection.  Patient did arrive with hypotension.  Sepsis mentioned in H&P, however no evidence of sepsis from my assessment.  Patient does have blood cultures obtained with results of 1 out of 4 bottles significant for gram variable rod which is concerning for contamination.  Urine culture with no growth. Repeat blood cultures (7/11) negative to date. Leukocytosis now resolved. -Follow blood cultures  Delirium Associated  agitation and a history of dementia.  Likely delirium. Improved but waxes and wanes. Seroquel increased to BID dosing -Watch mental status during the day -Continue Seroquel BID unless patient is not functional during the day  Hypernatremia Mild. -Encourage oral intake -Order free water 200 mL QID today  Hypokalemia Hypomagnesemia Continues to be low. Possibly secondary to continued PPI usage -Discontinue protonix -IV is out, continue oral magnesium and potassium supplementation -Telemetry (patient refusing)  Diabetes mellitus -Continue SSI  Chronic diastolic heart failure Currently compensated  COPD Right lower lobe lung carcinoma Patient is on chronic oxygen per chart review.  For cancer, appears patient was scheduled for biopsy last week.  Patient will need to follow-up as an outpatient. Oxygen bumped up to 4 L -Continue Breo Ellipta, Symbicort, O2 as needed -Wean to home oxygen of 2 L (although patient states he only uses this at night and when he feels he needs it) -Ambulatory pulse ox  Dementia Patient appears to be suffering from hospital associated delirium.  Management above.  Dyspnea Earlier this morning. Improved now. Placed on oxygen but unsure if actual desaturations. Wheezing on exam. History of COPD -Schedule nebulizer treatments   DVT prophylaxis: Heparin subcutaneous Code Status:   Code Status: Full Code Family Communication: None Disposition Plan: Concern for good follow-up on discharge, so although IV is out, will attempt to ensure continued control of electrolytes prior to discharge. Worse case, will need IV replaced   Consultants:   None  Procedures:   None  Antimicrobials:  Vancomycin (7/9)  Cefepime (7/9)   Subjective: Some mild trouble breathing this morning before getting up. No issues now.  Objective: Vitals:   07/12/19  1252 07/12/19 1328 07/12/19 1953 07/13/19 0432  BP:  133/74 130/60 (!) 144/69  Pulse:  79 79 80  Resp:  18  20 20   Temp:  98.7 F (37.1 C) 98.1 F (36.7 C) 98.5 F (36.9 C)  TempSrc:  Oral Oral Oral  SpO2: 92% 98% 96% 99%  Weight:    87.4 kg  Height:        Intake/Output Summary (Last 24 hours) at 07/13/2019 1027 Last data filed at 07/13/2019 0840 Gross per 24 hour  Intake 480 ml  Output 450 ml  Net 30 ml   Filed Weights   07/11/19 0121 07/12/19 0524 07/13/19 0432  Weight: 86.9 kg 84.6 kg 87.4 kg    Examination:  General exam: Appears calm and comfortable. Very pleasant today Respiratory system: Clear to auscultation. Respiratory effort normal. Cardiovascular system: S1 & S2 heard, RRR. No murmurs, rubs, gallops or clicks. Gastrointestinal system: Abdomen is nondistended, soft and nontender. No organomegaly or masses felt. Normal bowel sounds heard. Central nervous system: Alert Extremities: No edema. No calf tenderness Skin: No cyanosis. No rashes Psychiatry: Judgement and insight appear normal. Mood & affect appropriate.     Data Reviewed: I have personally reviewed following labs and imaging studies  CBC: Recent Labs  Lab 07/08/19 1037 07/09/19 0117 07/10/19 0732 07/11/19 0238 07/13/19 0741  WBC 23.5* 24.2* 21.9* 18.1* 7.3  NEUTROABS 21.6*  --   --   --   --   HGB 10.4* 9.1* 9.3* 8.6* 8.5*  HCT 32.1* 27.8* 29.1* 26.4* 26.7*  MCV 93.6 92.4 93.0 91.7 93.7  PLT 198 181 198 200 627   Basic Metabolic Panel: Recent Labs  Lab 07/09/19 0117  07/09/19 1716 07/10/19 0732 07/10/19 1829 07/11/19 0238 07/12/19 0343 07/13/19 0741  NA 142  --   --  144  --  146* 145 146*  K 2.3*   < > 3.2* 2.8* 3.1* 3.4* 3.3* 3.5  CL 109  --   --  109  --  113* 110 111  CO2 20*  --   --  23  --  24 24 24   GLUCOSE 92  --   --  117*  --  123* 83 102*  BUN 40*  --   --  37*  --  28* 14 9  CREATININE 4.04*  --   --  2.90*  --  2.08* 1.53* 1.49*  CALCIUM 7.2*  --   --  7.9*  --  8.1* 7.7* 8.2*  MG 1.1*  --  1.6* 1.5* 1.5*  --  1.3* 1.3*   < > = values in this interval not displayed.    GFR: Estimated Creatinine Clearance: 45.9 mL/min (A) (by C-G formula based on SCr of 1.49 mg/dL (H)). Liver Function Tests: Recent Labs  Lab 07/08/19 1037  AST 16  ALT 10  ALKPHOS 61  BILITOT 1.3*  PROT 6.7  ALBUMIN 3.3*   Recent Labs  Lab 07/08/19 1037  LIPASE 19   No results for input(s): AMMONIA in the last 168 hours. Coagulation Profile: Recent Labs  Lab 07/08/19 1220 07/09/19 0117  INR 1.4* 1.4*   Cardiac Enzymes: No results for input(s): CKTOTAL, CKMB, CKMBINDEX, TROPONINI in the last 168 hours. BNP (last 3 results) No results for input(s): PROBNP in the last 8760 hours. HbA1C: No results for input(s): HGBA1C in the last 72 hours. CBG: Recent Labs  Lab 07/12/19 0627 07/12/19 1100 07/12/19 1615 07/12/19 2123 07/13/19 0623  GLUCAP 90 130* 84 111* 93  Lipid Profile: No results for input(s): CHOL, HDL, LDLCALC, TRIG, CHOLHDL, LDLDIRECT in the last 72 hours. Thyroid Function Tests: No results for input(s): TSH, T4TOTAL, FREET4, T3FREE, THYROIDAB in the last 72 hours. Anemia Panel: No results for input(s): VITAMINB12, FOLATE, FERRITIN, TIBC, IRON, RETICCTPCT in the last 72 hours. Sepsis Labs: Recent Labs  Lab 07/08/19 1053 07/08/19 1222 07/08/19 1906 07/09/19 0117  LATICACIDVEN 2.1* 2.1* 3.9* 2.9*    Recent Results (from the past 240 hour(s))  SARS Coronavirus 2 (Performed in [place] hospital lab)     Status: None   Collection Time: 07/05/19  3:50 PM  Result Value Ref Range Status   SARS Coronavirus 2 NEGATIVE NEGATIVE Final    Comment: (NOTE) SARS-CoV-2 target nucleic acids are NOT DETECTED. The SARS-CoV-2 RNA is generally detectable in upper and lower respiratory specimens during the acute phase of infection. Negative results do not preclude SARS-CoV-2 infection, do not rule out co-infections with other pathogens, and should not be used as the sole basis for treatment or other patient management decisions. Negative results must be combined  with clinical observations, patient history, and epidemiological information. The expected result is Negative. Fact Sheet for Patients: SugarRoll.be Fact Sheet for Healthcare Providers: https://www.woods-mathews.com/ This test is not yet approved or cleared by the [place] FDA and  has been authorized for detection and/or diagnosis of SARS-CoV-2 by FDA under an Emergency Use Authorization (EUA). This EUA will remain  in effect (meaning this test can be used) for the duration of the COVID-19 declaration under Section 56 4(b)(1) of the Act, 21 U.S.C. section 360bbb-3(b)(1), unless the authorization is terminated or revoked sooner. Performed at [facility], [place] [address], [place], [place] [address]   Culture, blood (routine x 2)     Status: Abnormal   Collection Time: 07/08/19 10:53 AM   Specimen: BLOOD  Result Value Ref Range Status   Specimen Description BLOOD RIGHT ANTECUBITAL  Final   Special Requests   Final    BOTTLES DRAWN AEROBIC AND ANAEROBIC Blood Culture adequate volume   Culture  Setup Time (A)  Final    GRAM VARIABLE ROD ANAEROBIC BOTTLE ONLY CRITICAL RESULT CALLED TO, READ BACK BY AND VERIFIED WITH: Susy Manor [id number] [place] Performed at [facility], [address] [place], [place] [address]    Culture BACILLUS SPECIES (A)  Final   Report Status 07/10/2019 FINAL  Final  Blood Culture ID Panel (Reflexed)     Status: None   Collection Time: 07/08/19 10:53 AM  Result Value Ref Range Status   Enterococcus species NOT DETECTED NOT DETECTED Final    Comment: CRITICAL RESULT CALLED TO, READ BACK BY AND VERIFIED WITH: PHARMD JEREMY FRENS [phone] [id number] FCP    Listeria monocytogenes NOT DETECTED NOT DETECTED Final   Staphylococcus species NOT DETECTED NOT DETECTED Final   Staphylococcus aureus (BCID) NOT DETECTED NOT DETECTED Final   Streptococcus species NOT DETECTED NOT DETECTED Final   Streptococcus  agalactiae NOT DETECTED NOT DETECTED Final   Streptococcus pneumoniae NOT DETECTED NOT DETECTED Final   Streptococcus pyogenes NOT DETECTED NOT DETECTED Final   Acinetobacter baumannii NOT DETECTED NOT DETECTED Final   Enterobacteriaceae species NOT DETECTED NOT DETECTED Final   Enterobacter cloacae complex NOT DETECTED NOT DETECTED Final   Escherichia coli NOT DETECTED NOT DETECTED Final   Klebsiella oxytoca NOT DETECTED NOT DETECTED Final   Klebsiella pneumoniae NOT DETECTED NOT DETECTED Final   Proteus species NOT DETECTED NOT DETECTED Final  Serratia marcescens NOT DETECTED NOT DETECTED Final   Haemophilus influenzae NOT DETECTED NOT DETECTED Final   Neisseria meningitidis NOT DETECTED NOT DETECTED Final   Pseudomonas aeruginosa NOT DETECTED NOT DETECTED Final   Candida albicans NOT DETECTED NOT DETECTED Final   Candida glabrata NOT DETECTED NOT DETECTED Final   Candida krusei NOT DETECTED NOT DETECTED Final   Candida parapsilosis NOT DETECTED NOT DETECTED Final   Candida tropicalis NOT DETECTED NOT DETECTED Final    Comment: Performed at [facility], [place] [address], [place], [place] [address]  Culture, blood (routine x 2)     Status: None (Preliminary result)   Collection Time: 07/08/19 11:30 AM   Specimen: BLOOD RIGHT HAND  Result Value Ref Range Status   Specimen Description BLOOD RIGHT HAND  Final   Special Requests   Final    BOTTLES DRAWN AEROBIC ONLY Blood Culture adequate volume   Culture   Final    NO GROWTH 4 DAYS Performed at [facility], [place] [address] [place], [place] [address]    Report Status PENDING  Incomplete  SARS Coronavirus 2 (CEPHEID - Performed in [place] hospital lab), Hosp Order     Status: None   Collection Time: 07/08/19 11:34 AM   Specimen: Nasopharyngeal Swab  Result Value Ref Range Status   SARS Coronavirus 2 NEGATIVE NEGATIVE Final    Comment: (NOTE) If result is NEGATIVE SARS-CoV-2 target nucleic acids are NOT DETECTED.  The SARS-CoV-2 RNA is generally detectable in upper and lower  respiratory specimens during the acute phase of infection. The lowest  concentration of SARS-CoV-2 viral copies this assay can detect is 250  copies / mL. A negative result does not preclude SARS-CoV-2 infection  and should not be used as the sole basis for treatment or other  patient management decisions.  A negative result may occur with  improper specimen collection / handling, submission of specimen other  than nasopharyngeal swab, presence of viral mutation(s) within the  areas targeted by this assay, and inadequate number of viral copies  (<250 copies / mL). A negative result must be combined with clinical  observations, patient history, and epidemiological information. If result is POSITIVE SARS-CoV-2 target nucleic acids are DETECTED. The SARS-CoV-2 RNA is generally detectable in upper and lower  respiratory specimens dur ing the acute phase of infection.  Positive  results are indicative of active infection with SARS-CoV-2.  Clinical  correlation with patient history and other diagnostic information is  necessary to determine patient infection status.  Positive results do  not rule out bacterial infection or co-infection with other viruses. If result is PRESUMPTIVE POSTIVE SARS-CoV-2 nucleic acids MAY BE PRESENT.   A presumptive positive result was obtained on the submitted specimen  and confirmed on repeat testing.  While 2019 novel coronavirus  (SARS-CoV-2) nucleic acids may be present in the submitted sample  additional confirmatory testing may be necessary for epidemiological  and / or clinical management purposes  to differentiate between  SARS-CoV-2 and other Sarbecovirus currently known to infect humans.  If clinically indicated additional testing with an alternate test  methodology [phone]) is advised. The SARS-CoV-2 RNA is generally  detectable in upper and lower respiratory sp ecimens during the acute   phase of infection. The expected result is Negative. Fact Sheet for Patients:  StrictlyIdeas.no Fact Sheet for Healthcare Providers: BankingDealers.co.za This test is not yet approved or cleared by the [place] FDA and has been authorized for detection and/or diagnosis  of SARS-CoV-2 by FDA under an Emergency Use Authorization (EUA).  This EUA will remain in effect (meaning this test can be used) for the duration of the COVID-19 declaration under Section 564(b)(1) of the Act, 21 U.S.C. section 360bbb-3(b)(1), unless the authorization is terminated or revoked sooner. Performed at [facility], [place] [address], [place], [place] [address]   Urine culture     Status: None   Collection Time: 07/08/19 12:32 PM   Specimen: Urine, Clean Catch  Result Value Ref Range Status   Specimen Description URINE, CLEAN CATCH  Final   Special Requests NONE  Final   Culture   Final    NO GROWTH Performed at [facility], [place] [address], [place], [place] [address]    Report Status 07/09/2019 FINAL  Final  Culture, blood (routine x 2)     Status: None (Preliminary result)   Collection Time: 07/10/19  1:25 PM   Specimen: BLOOD  Result Value Ref Range Status   Specimen Description BLOOD RIGHT ANTECUBITAL  Final   Special Requests   Final    BOTTLES DRAWN AEROBIC ONLY Blood Culture adequate volume   Culture   Final    NO GROWTH 2 DAYS Performed at [facility], [place] [address], [place], [place] [address]    Report Status PENDING  Incomplete  Culture, blood (routine x 2)     Status: None (Preliminary result)   Collection Time: 07/10/19  6:17 PM   Specimen: BLOOD LEFT HAND  Result Value Ref Range Status   Specimen Description BLOOD LEFT HAND  Final   Special Requests   Final    BOTTLES DRAWN AEROBIC AND ANAEROBIC Blood Culture results may not be optimal due to an inadequate volume of blood received in culture bottles   Culture    Final    NO GROWTH 2 DAYS Performed at [facility], [place] [address], [place], [place] [address]    Report Status PENDING  Incomplete         Radiology Studies: Dg Chest Port 1 View  Result Date: 07/12/2019 CLINICAL DATA:  Wheezing and shortness of breath. EXAM: PORTABLE CHEST 1 VIEW COMPARISON:  Chest CT April 22nd 2020 FINDINGS: Cardiomediastinal silhouette is normal. Mediastinal contours appear intact. Right pulmonary masses are not significantly changed from the prior studies. Right pleural thickening versus small pleural effusion. Bibasilar atelectasis. Osseous structures are without acute abnormality. Soft tissues are grossly normal. IMPRESSION: 1. Right pulmonary masses are not significantly changed from the prior studies. 2. Right pleural thickening versus small pleural effusion. 3. Bibasilar atelectasis. Electronically Signed   By: Fidela Salisbury M.D.   On: 07/12/2019 09:11        Scheduled Meds: . albuterol  2.5 mg Nebulization QID  . allopurinol  300 mg Oral Daily  . amLODipine  10 mg Oral Daily  . atorvastatin  20 mg Oral Daily  . bisoprolol  5 mg Oral Daily  . ferrous sulfate  325 mg Oral Q M,W,F  . fluticasone furoate-vilanterol  1 puff Inhalation Daily  . folic acid  1 mg Oral Daily  . free water  200 mL Oral QID  . heparin  5,000 Units Subcutaneous Q8H  . insulin aspart  0-15 Units Subcutaneous TID WC  . mouth rinse  15 mL Mouth Rinse BID  . QUEtiapine  25 mg Oral BID  . senna-docusate  1 tablet Oral QHS  . sucralfate  1 g Oral 5 X Daily  Continuous Infusions:    LOS: 5 days     Cordelia Poche, MD [facility] 07/13/2019, 10:27 AM  If 7PM-7AM, please contact night-coverage [email or web address]

## 2019-07-14 LAB — GLUCOSE, CAPILLARY
Glucose-Capillary: 98 mg/dL (ref 70–99)
Glucose-Capillary: 148 mg/dL — ABNORMAL HIGH (ref 70–99)
Glucose-Capillary: 106 mg/dL — ABNORMAL HIGH (ref 70–99)
Glucose-Capillary: 107 mg/dL — ABNORMAL HIGH (ref 70–99)

## 2019-07-14 LAB — BASIC METABOLIC PANEL
Anion gap: 9 (ref 5–15)
BUN: 9 mg/dL (ref 8–23)
CO2: 26 mmol/L (ref 22–32)
Calcium: 8.4 mg/dL — ABNORMAL LOW (ref 8.9–10.3)
Chloride: 107 mmol/L (ref 98–111)
Creatinine, Ser: 1.37 mg/dL — ABNORMAL HIGH (ref 0.61–1.24)
GFR calc Af Amer: 58 mL/min — ABNORMAL LOW (ref >60)
GFR calc non Af Amer: 50 mL/min — ABNORMAL LOW (ref >60)
Glucose, Bld: 98 mg/dL (ref 70–99)
Potassium: 3.2 mmol/L — ABNORMAL LOW (ref 3.5–5.1)
Sodium: 142 mmol/L (ref 135–145)

## 2019-07-14 LAB — MAGNESIUM: Magnesium: 1.2 mg/dL — ABNORMAL LOW (ref 1.7–2.4)

## 2019-07-14 MED ORDER — ALBUTEROL SULFATE (2.5 MG/3ML) 0.083% IN NEBU
2.5000 mg | INHALATION_SOLUTION | Freq: Two times a day (BID) | RESPIRATORY_TRACT | Status: DC
  Administered 2019-07-14 – 2019-07-16 (×4): 2.5 mg via RESPIRATORY_TRACT
  Filled 2019-07-14 (×4): qty 3

## 2019-07-14 MED ORDER — MAGNESIUM SULFATE 4 GM/100ML IV SOLN
4.0000 g | Freq: Once | INTRAVENOUS | Status: AC
  Administered 2019-07-14: 4 g via INTRAVENOUS
  Filled 2019-07-14: qty 100

## 2019-07-14 MED ORDER — POTASSIUM CHLORIDE CRYS ER 20 MEQ PO TBCR
40.0000 meq | EXTENDED_RELEASE_TABLET | Freq: Two times a day (BID) | ORAL | Status: AC
  Administered 2019-07-14 (×2): 40 meq via ORAL
  Filled 2019-07-14 (×3): qty 2

## 2019-07-14 NOTE — Progress Notes (Signed)
PROGRESS NOTE  John [name] UTM:[id number] DOB: March 12, 1946 DOA: 07/08/2019 PCP: [name], NP  HPI/Recap of past 24 hours: John [name] is a 73 y.o. 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  Patient presented secondary to altered mental status and found to have hypotension, acute kidney injury, electrolyte abnormalities.  07/14/19: Patient was seen and examined at his bedside.  He is pleasantly demented.  He did not have any complaints.  He denies chest pain, dyspnea, abdominal pain or nausea.   Assessment/Plan: Principal Problem:   Acute renal failure (ARF) (HCC) Active Problems:   Chronic diastolic CHF (congestive heart failure) (HCC)   HTN (hypertension)   COPD II/III with reversibility    Primary cancer of right lower lobe of lung (HCC)   CKD (chronic kidney disease), stage III (HCC)   Hypotension   Vascular dementia without behavioral disturbance (HCC)   Hypokalemia   AKI on CKD 3 Baseline creatinine appears to be 1.3 with GFR 58 Presented with creatinine of 4.54 Continued continues to trend down and appears to be back to his baseline Continue to avoid nephrotoxins Continue to monitor urine output 950 cc recorded in the last 24 hours Repeat BMP in the morning  Hypokalemia Potassium 3.2, repleted with KCl p.o. 40 mEq x 2 Repeat BMP in the morning  Hypomagnesemia Magnesium 1.2 Currently being repleted Add IV magnesium 4 g once  Hypertension Stable at this time Continue medications Continue to monitor vital signs  Type 2 diabetes Continue insulin sliding scale Last hemoglobin A1c 5.8 on 05/04/8126  Chronic diastolic CHF Stable at this time Continue strict I's and O's and daily weight  Right lower lobe lung carcinoma On chronic oxygen use Maintain O2 saturation greater than 92% Continue  Brio Ellipta, Symbicort Continue to wean off oxygen down to 2 L  Dementia  pleasantly confused Reorient as needed   DVT prophylaxis: Heparin subcutaneous Code Status:   Code Status: Full Code Family Communication: None Disposition Plan: Concern for good follow-up on discharge, so although IV is out, will attempt to ensure continued control of electrolytes prior to discharge. Worse case, will need IV replaced   Consultants:   None  Procedures:   None  Antimicrobials:  Vancomycin (7/9)  Cefepime (7/9)    Objective: Vitals:   07/13/19 1634 07/13/19 1946 07/13/19 2023 07/14/19 0451  BP:  126/62  (!) 161/76  Pulse: 78 75  93  Resp: 18 20  18   Temp:  98.5 F (36.9 C)  98.5 F (36.9 C)  TempSrc:  Oral  Oral  SpO2: 95% 96% 94% 95%  Weight:    87.7 kg  Height:        Intake/Output Summary (Last 24 hours) at 07/14/2019 0818 Last data filed at 07/14/2019 0506 Gross per 24 hour  Intake 920 ml  Output 950 ml  Net -30 ml   Filed Weights   07/12/19 0524 07/13/19 0432 07/14/19 0451  Weight: 84.6 kg 87.4 kg 87.7 kg    Exam:   General: 73 y.o. year-old male well developed well nourished in no acute distress.  Pleasantly demented.  Cardiovascular: Regular rate and rhythm with no rubs or gallops.  No thyromegaly or JVD noted.    Respiratory: Clear to auscultation with no wheezes or rales. Good inspiratory effort.  Abdomen: Soft nontender nondistended with normal bowel sounds x4 quadrants.  Musculoskeletal: Trace lower extremity edema.  2/4 pulses in all 4 extremities.  Psychiatry: Mood is appropriate for condition and setting   Data Reviewed: CBC: Recent Labs  Lab 07/08/19 1037 07/09/19 0117 07/10/19 0732 07/11/19 0238 07/13/19 0741  WBC 23.5* 24.2* 21.9* 18.1* 7.3  NEUTROABS 21.6*  --   --   --   --   HGB 10.4* 9.1* 9.3* 8.6* 8.5*  HCT 32.1* 27.8* 29.1* 26.4* 26.7*  MCV 93.6 92.4 93.0 91.7 93.7  PLT 198 181 198 200 361   Basic Metabolic  Panel: Recent Labs  Lab 07/10/19 0732 07/10/19 1829 07/11/19 0238 07/12/19 0343 07/13/19 0741 07/14/19 0334  NA 144  --  146* 145 146* 142  K 2.8* 3.1* 3.4* 3.3* 3.5 3.2*  CL 109  --  113* 110 111 107  CO2 23  --  24 24 24 26   GLUCOSE 117*  --  123* 83 102* 98  BUN 37*  --  28* 14 9 9   CREATININE 2.90*  --  2.08* 1.53* 1.49* 1.37*  CALCIUM 7.9*  --  8.1* 7.7* 8.2* 8.4*  MG 1.5* 1.5*  --  1.3* 1.3* 1.2*   GFR: Estimated Creatinine Clearance: 50 mL/min (A) (by C-G formula based on SCr of 1.37 mg/dL (H)). Liver Function Tests: Recent Labs  Lab 07/08/19 1037  AST 16  ALT 10  ALKPHOS 61  BILITOT 1.3*  PROT 6.7  ALBUMIN 3.3*   Recent Labs  Lab 07/08/19 1037  LIPASE 19   No results for input(s): AMMONIA in the last 168 hours. Coagulation Profile: Recent Labs  Lab 07/08/19 1220 07/09/19 0117  INR 1.4* 1.4*   Cardiac Enzymes: No results for input(s): CKTOTAL, CKMB, CKMBINDEX, TROPONINI in the last 168 hours. BNP (last 3 results) No results for input(s): PROBNP in the last 8760 hours. HbA1C: No results for input(s): HGBA1C in the last 72 hours. CBG: Recent Labs  Lab 07/13/19 0623 07/13/19 1105 07/13/19 1640 07/13/19 2154 07/14/19 0614  GLUCAP 93 122* 107* 112* 98   Lipid Profile: No results for input(s): CHOL, HDL, LDLCALC, TRIG, CHOLHDL, LDLDIRECT in the last 72 hours. Thyroid Function Tests: No results for input(s): TSH, T4TOTAL, FREET4, T3FREE, THYROIDAB in the last 72 hours. Anemia Panel: No results for input(s): VITAMINB12, FOLATE, FERRITIN, TIBC, IRON, RETICCTPCT in the last 72 hours. Urine analysis:    Component Value Date/Time   COLORURINE YELLOW 07/08/2019 1038   APPEARANCEUR HAZY (A) 07/08/2019 1038   LABSPEC 1.020 07/08/2019 1038   PHURINE 5.0 07/08/2019 1038   GLUCOSEU NEGATIVE 07/08/2019 1038   HGBUR SMALL (A) 07/08/2019 1038   BILIRUBINUR NEGATIVE 07/08/2019 1038   KETONESUR NEGATIVE 07/08/2019 1038   PROTEINUR 30 (A) 07/08/2019 1038    UROBILINOGEN 1.0 02/22/2012 0238   NITRITE NEGATIVE 07/08/2019 1038   LEUKOCYTESUR NEGATIVE 07/08/2019 1038   Sepsis Labs: @LABRCNTIP (procalcitonin:4,lacticidven:4)  ) Recent Results (from the past 240 hour(s))  SARS Coronavirus 2 (Performed in [place] hospital lab)     Status: None   Collection Time: 07/05/19  3:50 PM  Result Value Ref Range Status   SARS Coronavirus 2 NEGATIVE NEGATIVE Final    Comment: (NOTE) SARS-CoV-2 target nucleic acids are NOT DETECTED. The SARS-CoV-2 RNA is generally detectable in upper and lower respiratory specimens during the acute phase of infection. Negative results do not preclude SARS-CoV-2 infection, do not rule out co-infections with other pathogens, and should not be used as the sole basis for treatment or other patient management decisions. Negative results must be combined with clinical observations, patient  history, and epidemiological information. The expected result is Negative. Fact Sheet for Patients: SugarRoll.be Fact Sheet for Healthcare Providers: https://www.woods-mathews.com/ This test is not yet approved or cleared by the [place] FDA and  has been authorized for detection and/or diagnosis of SARS-CoV-2 by FDA under an Emergency Use Authorization (EUA). This EUA will remain  in effect (meaning this test can be used) for the duration of the COVID-19 declaration under Section 56 4(b)(1) of the Act, 21 U.S.C. section 360bbb-3(b)(1), unless the authorization is terminated or revoked sooner. Performed at [facility], [place] [address], [place], [place] [address]   Culture, blood (routine x 2)     Status: Abnormal   Collection Time: 07/08/19 10:53 AM   Specimen: BLOOD  Result Value Ref Range Status   Specimen Description BLOOD RIGHT ANTECUBITAL  Final   Special Requests   Final    BOTTLES DRAWN AEROBIC AND ANAEROBIC Blood Culture adequate volume   Culture  Setup Time (A)   Final    GRAM VARIABLE ROD ANAEROBIC BOTTLE ONLY CRITICAL RESULT CALLED TO, READ BACK BY AND VERIFIED WITH: Susy Manor [id number] [place] Performed at [facility], [address] [place], [place] [address]    Culture BACILLUS SPECIES (A)  Final   Report Status 07/10/2019 FINAL  Final  Blood Culture ID Panel (Reflexed)     Status: None   Collection Time: 07/08/19 10:53 AM  Result Value Ref Range Status   Enterococcus species NOT DETECTED NOT DETECTED Final    Comment: CRITICAL RESULT CALLED TO, READ BACK BY AND VERIFIED WITH: PHARMD JEREMY FRENS [id number] [id number] FCP    Listeria monocytogenes NOT DETECTED NOT DETECTED Final   Staphylococcus species NOT DETECTED NOT DETECTED Final   Staphylococcus aureus (BCID) NOT DETECTED NOT DETECTED Final   Streptococcus species NOT DETECTED NOT DETECTED Final   Streptococcus agalactiae NOT DETECTED NOT DETECTED Final   Streptococcus pneumoniae NOT DETECTED NOT DETECTED Final   Streptococcus pyogenes NOT DETECTED NOT DETECTED Final   Acinetobacter baumannii NOT DETECTED NOT DETECTED Final   Enterobacteriaceae species NOT DETECTED NOT DETECTED Final   Enterobacter cloacae complex NOT DETECTED NOT DETECTED Final   Escherichia coli NOT DETECTED NOT DETECTED Final   Klebsiella oxytoca NOT DETECTED NOT DETECTED Final   Klebsiella pneumoniae NOT DETECTED NOT DETECTED Final   Proteus species NOT DETECTED NOT DETECTED Final   Serratia marcescens NOT DETECTED NOT DETECTED Final   Haemophilus influenzae NOT DETECTED NOT DETECTED Final   Neisseria meningitidis NOT DETECTED NOT DETECTED Final   Pseudomonas aeruginosa NOT DETECTED NOT DETECTED Final   Candida albicans NOT DETECTED NOT DETECTED Final   Candida glabrata NOT DETECTED NOT DETECTED Final   Candida krusei NOT DETECTED NOT DETECTED Final   Candida parapsilosis NOT DETECTED NOT DETECTED Final   Candida tropicalis NOT DETECTED NOT DETECTED Final    Comment: Performed at [facility], [address] [place], [place] [address]  Culture, blood (routine x 2)     Status: None   Collection Time: 07/08/19 11:30 AM   Specimen: BLOOD RIGHT HAND  Result Value Ref Range Status   Specimen Description BLOOD RIGHT HAND  Final   Special Requests   Final    BOTTLES DRAWN AEROBIC ONLY Blood Culture adequate volume   Culture   Final    NO GROWTH 5 DAYS Performed at [facility], [place] [address], [place],  [address]    Report Status 07/13/2019 FINAL  Final  SARS Coronavirus 2 (CEPHEID - Performed in [place] hospital lab), Hosp Order     Status: None   Collection Time: 07/08/19 11:34 AM   Specimen: Nasopharyngeal Swab  Result Value Ref Range Status   SARS Coronavirus 2 NEGATIVE NEGATIVE Final    Comment: (NOTE) If result is NEGATIVE SARS-CoV-2 target nucleic acids are NOT DETECTED. The SARS-CoV-2 RNA is generally detectable in upper and lower  respiratory specimens during the acute phase of infection. The lowest  concentration of SARS-CoV-2 viral copies this assay can detect is 250  copies / mL. A negative result does not preclude SARS-CoV-2 infection  and should not be used as the sole basis for treatment or other  patient management decisions.  A negative result may occur with  improper specimen collection / handling, submission of specimen other  than nasopharyngeal swab, presence of viral mutation(s) within the  areas targeted by this assay, and inadequate number of viral copies  (<250 copies / mL). A negative result must be combined with clinical  observations, patient history, and epidemiological information. If result is POSITIVE SARS-CoV-2 target nucleic acids are DETECTED. The SARS-CoV-2 RNA is generally detectable in upper and lower  respiratory specimens dur ing the acute phase of infection.  Positive  results are indicative of active infection with SARS-CoV-2.  Clinical  correlation with patient history and other diagnostic information is   necessary to determine patient infection status.  Positive results do  not rule out bacterial infection or co-infection with other viruses. If result is PRESUMPTIVE POSTIVE SARS-CoV-2 nucleic acids MAY BE PRESENT.   A presumptive positive result was obtained on the submitted specimen  and confirmed on repeat testing.  While 2019 novel coronavirus  (SARS-CoV-2) nucleic acids may be present in the submitted sample  additional confirmatory testing may be necessary for epidemiological  and / or clinical management purposes  to differentiate between  SARS-CoV-2 and other Sarbecovirus currently known to infect humans.  If clinically indicated additional testing with an alternate test  methodology [phone]) is advised. The SARS-CoV-2 RNA is generally  detectable in upper and lower respiratory sp ecimens during the acute  phase of infection. The expected result is Negative. Fact Sheet for Patients:  StrictlyIdeas.no Fact Sheet for Healthcare Providers: BankingDealers.co.za This test is not yet approved or cleared by the [place] FDA and has been authorized for detection and/or diagnosis of SARS-CoV-2 by FDA under an Emergency Use Authorization (EUA).  This EUA will remain in effect (meaning this test can be used) for the duration of the COVID-19 declaration under Section 564(b)(1) of the Act, 21 U.S.C. section 360bbb-3(b)(1), unless the authorization is terminated or revoked sooner. Performed at [facility], [place] [address], [place], [place] [address]   Urine culture     Status: None   Collection Time: 07/08/19 12:32 PM   Specimen: Urine, Clean Catch  Result Value Ref Range Status   Specimen Description URINE, CLEAN CATCH  Final   Special Requests NONE  Final   Culture   Final    NO GROWTH Performed at [facility], [place] [address], [place], [place] [address]    Report Status 07/09/2019 FINAL  Final  Culture, blood  (routine x 2)     Status: None (Preliminary result)   Collection Time: 07/10/19  1:25 PM   Specimen: BLOOD  Result Value Ref Range Status   Specimen Description BLOOD RIGHT ANTECUBITAL  Final   Special Requests   Final  BOTTLES DRAWN AEROBIC ONLY Blood Culture adequate volume   Culture   Final    NO GROWTH 3 DAYS Performed at [facility], [place] [address], [place], [place] [address]    Report Status PENDING  Incomplete  Culture, blood (routine x 2)     Status: None (Preliminary result)   Collection Time: 07/10/19  6:17 PM   Specimen: BLOOD LEFT HAND  Result Value Ref Range Status   Specimen Description BLOOD LEFT HAND  Final   Special Requests   Final    BOTTLES DRAWN AEROBIC AND ANAEROBIC Blood Culture results may not be optimal due to an inadequate volume of blood received in culture bottles   Culture   Final    NO GROWTH 3 DAYS Performed at [facility], [place] [address] [place], [place] [address]    Report Status PENDING  Incomplete      Studies: No results found.  Scheduled Meds:  albuterol  2.5 mg Nebulization QID   allopurinol  300 mg Oral Daily   amLODipine  10 mg Oral Daily   atorvastatin  20 mg Oral Daily   bisoprolol  5 mg Oral Daily   ferrous sulfate  325 mg Oral Q M,W,F   fluticasone furoate-vilanterol  1 puff Inhalation Daily   folic acid  1 mg Oral Daily   free water  200 mL Oral QID   heparin  5,000 Units Subcutaneous Q8H   insulin aspart  0-15 Units Subcutaneous TID WC   magnesium oxide  800 mg Oral BID   mouth rinse  15 mL Mouth Rinse BID   potassium chloride  40 mEq Oral BID   QUEtiapine  25 mg Oral BID   senna-docusate  1 tablet Oral QHS   sucralfate  1 g Oral 5 X Daily    Continuous Infusions:  magnesium sulfate bolus IVPB       LOS: 6 days     Kayleen Memos, MD [facility] Pager [phone]  If 7PM-7AM, please contact night-coverage [email or web address] Password [facility] 07/14/2019, 8:18 AM

## 2019-07-14 NOTE — Progress Notes (Signed)
Patient has been corporative with an uneventful day. Vitals stable with no complaints

## 2019-07-14 NOTE — Progress Notes (Signed)
Physical Therapy Treatment Patient Details Name: John [name] MRN: [id number] DOB: 12/07/1946 Today's Date: 07/14/2019    History of Present Illness 73 yo admitted with AMS and AKI. PMhx: CAD, COPD on home O2, small cell lung CA, CHF, HTN, HLD, dementia, DM    PT Comments    Pt was up to side of bed with changing of his mind regarding being up vs refusing altogether.  He was able to do ROM and strengthening with the exception of pain to actively extend R knee apparently from falling on it.  He tolerated resisted flexion but active assisted knee ext on R knee, and will need to use AD to maintain safety and comfort with that knee.  Continue to work on his mobility and tolerance for RLE use as able, and progress to more challenging gait as he is willing to perform.   Follow Up Recommendations  Home health PT     Equipment Recommendations  Rolling walker with 5" wheels    Recommendations for Other Services       Precautions / Restrictions Precautions Precautions: Fall Precaution Comments: watch O2 Restrictions Weight Bearing Restrictions: No    Mobility  Bed Mobility Overal bed mobility: Modified Independent                Transfers Overall transfer level: Needs assistance   Transfers: Sit to/from Stand Sit to Stand: Min guard         General transfer comment: pt wanted to sit on side of bed then offered to stand without warning  Ambulation/Gait             General Gait Details: declined   Stairs             Wheelchair Mobility    Modified Rankin (Stroke Patients Only)       Balance     Sitting balance-Leahy Scale: Good                                      Cognition Arousal/Alertness: Awake/alert Behavior During Therapy: WFL for tasks assessed/performed Overall Cognitive Status: Impaired/Different from baseline Area of Impairment: Safety/judgement;Awareness;Problem solving;Memory                      Memory: Decreased recall of precautions;Decreased short-term memory   Safety/Judgement: Decreased awareness of safety;Decreased awareness of deficits Awareness: Intellectual Problem Solving: Slow processing;Requires verbal cues;Requires tactile cues        Exercises General Exercises - Lower Extremity Ankle Circles/Pumps: AROM;Both;5 reps Long Arc Quad: Strengthening;AAROM;Both;10 reps Heel Slides: Strengthening;Both;10 reps Hip ABduction/ADduction: Strengthening;Both;10 reps Hip Flexion/Marching: AROM;Both;10 reps    General Comments General comments (skin integrity, edema, etc.): pt was up to side of bed with significant pain on R knee to extend, resulting from fall per pt      Pertinent Vitals/Pain Pain Assessment: Faces Faces Pain Scale: Hurts even more Pain Location: R knee with active ext Pain Descriptors / Indicators: Aching;Jabbing Pain Intervention(s): Repositioned;Monitored during session    Home Living                      Prior Function            PT Goals (current goals can now be found in the care plan section) Acute Rehab PT Goals Patient Stated Goal: return home Progress towards PT goals: Progressing toward goals    Frequency  Min 3X/week      PT Plan Current plan remains appropriate    Co-evaluation              AM-PAC PT "6 Clicks" Mobility   Outcome Measure  Help needed turning from your back to your side while in a flat bed without using bedrails?: None Help needed moving from lying on your back to sitting on the side of a flat bed without using bedrails?: None Help needed moving to and from a bed to a chair (including a wheelchair)?: A Little Help needed standing up from a chair using your arms (e.g., wheelchair or bedside chair)?: A Little Help needed to walk in hospital room?: A Little Help needed climbing 3-5 steps with a railing? : A Lot 6 Click Score: 19    End of Session Equipment Utilized During Treatment:  Gait belt Activity Tolerance: Patient tolerated treatment well;Patient limited by pain(pain on R knee) Patient left: in bed;with call bell/phone within reach;with bed alarm set Nurse Communication: Mobility status PT Visit Diagnosis: Other abnormalities of gait and mobility (R26.89);Muscle weakness (generalized) (M62.81);Difficulty in walking, not elsewhere classified (R26.2)     Time: 9728-2060 PT Time Calculation (min) (ACUTE ONLY): 18 min  Charges:  $Therapeutic Exercise: 8-22 mins                     Ramond Dial 07/14/2019, 3:56 PM   Mee Hives, PT MS Acute Rehab Dept. Number: [place]

## 2019-07-15 ENCOUNTER — Encounter: Payer: Self-pay | Admitting: *Deleted

## 2019-07-15 ENCOUNTER — Ambulatory Visit
Admission: RE | Admit: 2019-07-15 | Discharge: 2019-07-15 | Disposition: A | Discharge status: Home / Self Care | Payer: Medicare Other | Source: Ambulatory Visit | Attending: Radiation Oncology | Admitting: Radiation Oncology

## 2019-07-15 ENCOUNTER — Telehealth: Payer: Self-pay | Admitting: Internal Medicine

## 2019-07-15 LAB — BASIC METABOLIC PANEL
Anion gap: 8 (ref 5–15)
BUN: 7 mg/dL — ABNORMAL LOW (ref 8–23)
CO2: 28 mmol/L (ref 22–32)
Calcium: 8.7 mg/dL — ABNORMAL LOW (ref 8.9–10.3)
Chloride: 107 mmol/L (ref 98–111)
Creatinine, Ser: 1.26 mg/dL — ABNORMAL HIGH (ref 0.61–1.24)
GFR calc Af Amer: >60 mL/min (ref >60)
GFR calc non Af Amer: 56 mL/min — ABNORMAL LOW (ref >60)
Glucose, Bld: 92 mg/dL (ref 70–99)
Potassium: 4.4 mmol/L (ref 3.5–5.1)
Sodium: 143 mmol/L (ref 135–145)

## 2019-07-15 LAB — GLUCOSE, CAPILLARY
Glucose-Capillary: 90 mg/dL (ref 70–99)
Glucose-Capillary: 95 mg/dL (ref 70–99)

## 2019-07-15 LAB — CULTURE, BLOOD (ROUTINE X 2)
Culture: NO GROWTH
Culture: NO GROWTH
Special Requests: ADEQUATE

## 2019-07-15 LAB — MAGNESIUM: Magnesium: 1.8 mg/dL (ref 1.7–2.4)

## 2019-07-15 MED ORDER — QUETIAPINE FUMARATE 25 MG PO TABS: 25.0000 mg | ORAL_TABLET | Freq: Two times a day (BID) | ORAL | 0 refills | Status: DC

## 2019-07-15 NOTE — Discharge Summary (Addendum)
Discharge Summary  John [name] AYT:[id number] DOB: 05-09-1946  PCP: [name], NP  Admit date: 07/08/2019 Discharge date: 07/15/2019  Time spent: 35 minutes  Recommendations for Outpatient Follow-up:  1. Follow-up with your primary care provider 2. Follow-up with your pulmonologist 3. Follow-up with your oncologist 4. Take your medications as prescribed 5. Continue physical therapy 6. Fall precaution  Discharge Diagnoses:  Active Hospital Problems   Diagnosis Date Noted   Acute renal failure (ARF) (John [name]) 07/08/2019   Hypokalemia 07/08/2019   Vascular dementia without behavioral disturbance (HCC)    Hypotension 04/20/2019   CKD (chronic kidney disease), stage III (John [name]) 04/08/2019   Primary cancer of right lower lobe of lung (John [name]) 06/14/2015   COPD II/III with reversibility  12/19/2012   Chronic diastolic CHF (congestive heart failure) (John [name]) 02/22/2012   HTN (hypertension) 02/22/2012    Resolved Hospital Problems  No resolved problems to display.    Discharge Condition: Stable  Diet recommendation: Heart healthy low-salt carb modified diet.  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 Patient presented secondary to altered mental status and found to have hypotension, acute kidney injury, electrolyte abnormalities.  07/15/19: Patient was seen and examined at his bedside.  He has no new complaints.  He denies chest pain, dyspnea, palpitations, nausea, abdominal pain or diarrhea.  He answers questions appropriately.  He has no new complaints.  On the day of discharge, the patient was hemodynamically stable.  He will need  to follow-up with his primary care provider, pulmonologist and oncologist posthospitalization.  He will also need to continue physical therapy as tolerated.  Fall precautions.    Hospital Course:  Principal Problem:   Acute renal failure (ARF) (HCC) Active Problems:   Chronic diastolic CHF (congestive heart failure) (HCC)   HTN (hypertension)   COPD II/III with reversibility    Primary cancer of right lower lobe of lung (HCC)   CKD (chronic kidney disease), stage III (HCC)   Hypotension   Vascular dementia without behavioral disturbance (HCC)   Hypokalemia  Resolved AKI on CKD 3 Baseline creatinine appears to be 1.2 with GFR >60 Presented with creatinine of 4.54 Creatinine on 07/15/2019 was 1.26 with GFR greater than 60 Continue to avoid nephrotoxins Avoid dehydration Follow-up with your primary care provider  Acute on chronic hypoxic respiratory failure On 2 L of oxygen at baseline Obtain home O2 evaluation prior to discharge Maintain O2 saturation greater than 92%  Resolved hypokalemia post repletion  Potassium 3.2>>4.4  Resolved hypomagnesemia post repletion  Magnesium 1.2>>1.8  Hypertension Blood pressure is at goal  Continue amlodipine 10 mg daily and bisoprolol 5 mg daily  Lasix and spironolactone are being held to avoid hypotension  Type 2 diabetes Continue insulin sliding scale Last hemoglobin A1c 5.8 on 03/04/5731  Chronic diastolic CHF Stable at this time  Right lower lobe lung carcinoma Maintain O2 saturation greater than 92% Continue Brio Ellipta Home O2 evaluation prior to discharge    Code Status:Code Status: Full Code   Procedures:  None  Consultations:  None  Discharge Exam: BP 122/72 (BP Location: Left Arm)    Pulse 79    Temp  98.1 F (36.7 C) (Oral)    Resp 19    Ht 5\' 7"  (1.702 m)    Wt 88.1 kg Comment: scale a   SpO2 95%    BMI 30.42 kg/m   General: 73 y.o. year-old male well developed well nourished in no acute  distress.  Alert and interactive.  Cardiovascular: Regular rate and rhythm with no rubs or gallops.  No thyromegaly or JVD noted.    Respiratory: Clear to auscultation with no wheezes or rales. Good inspiratory effort.  Abdomen: Soft nontender nondistended with normal bowel sounds x4 quadrants.  Musculoskeletal: No lower extremity edema. 2/4 pulses in all 4 extremities.  Psychiatry: Mood is appropriate for condition and setting  Discharge Instructions You were cared for by a hospitalist during your hospital stay. If you have any questions about your discharge medications or the care you received while you were in the hospital after you are discharged, you can call the unit and asked to speak with the hospitalist on call if the hospitalist that took care of you is not available. Once you are discharged, your primary care physician will handle any further medical issues. Please note that NO REFILLS for any discharge medications will be authorized once you are discharged, as it is imperative that you return to your primary care physician (or establish a relationship with a primary care physician if you do not have one) for your aftercare needs so that they can reassess your need for medications and monitor your lab values.   Allergies as of 07/15/2019      Reactions   Levaquin [levofloxacin In D5w] Other (See Comments)   Unknown rxn   Lisinopril Swelling      Medication List    STOP taking these medications   budesonide-formoterol 160-4.5 MCG/ACT inhaler Commonly known as: Symbicort   cloNIDine 0.1 MG tablet Commonly known as: CATAPRES   naproxen 500 MG tablet Commonly known as: NAPROSYN   spironolactone 25 MG tablet Commonly known as: ALDACTONE     TAKE these medications   albuterol 108 (90 Base) MCG/ACT inhaler Commonly known as: ProAir HFA INHALE 2 PUFFS BY MOUTH EVERY 4 HOURS AS NEEDED FOR WHEEZING What changed:   how much to take  how to take this  when to take  this  reasons to take this  additional instructions   allopurinol 300 MG tablet Commonly known as: ZYLOPRIM Take 300 mg by mouth daily.   amLODipine 10 MG tablet Commonly known as: NORVASC Take 10 mg by mouth daily.   atorvastatin 20 MG tablet Commonly known as: LIPITOR Take 1 tablet (20 mg total) by mouth daily.   bisoprolol 5 MG tablet Commonly known as: ZEBETA Take 1 tablet (5 mg total) by mouth daily. Notes to patient: 7/17   Breo Ellipta 100-25 MCG/INH Aepb Generic drug: fluticasone furoate-vilanterol Inhale 1 puff into the lungs 2 (two) times a day.   ferrous sulfate 325 (65 FE) MG tablet Take 1 tablet (325 mg total) by mouth every Monday, Wednesday, and Friday. Notes to patient: 8/10   folic acid 1 MG tablet Commonly known as: FOLVITE Take 1 tablet (1 mg total) by mouth daily. Notes to patient: 7/17   furosemide 20 MG tablet Commonly known as: LASIX Take 1 tablet (20 mg total) by mouth every Monday, Wednesday, and Friday. Notes to patient: 7/17   metFORMIN 1000 MG tablet Commonly known as: GLUCOPHAGE Take 0.5 tablets (500 mg total) by mouth 2 (two) times daily.   pantoprazole 40  MG tablet Commonly known as: PROTONIX Take 1 tablet (40 mg total) by mouth 2 (two) times daily before a meal. Notes to patient: 7/16   QUEtiapine 25 MG tablet Commonly known as: SEROQUEL Take 1 tablet (25 mg total) by mouth 2 (two) times daily. Notes to patient: 7/16   senna-docusate 8.6-50 MG tablet Commonly known as: Senokot-S Take 1 tablet by mouth at bedtime. Notes to patient: 7/16   sucralfate 1 g tablet Commonly known as: CARAFATE Take 1 g by mouth See admin instructions. Four time daily and at bedtime Notes to patient: 9pm   Vitamin D (Ergocalciferol) 1.25 MG (50000 UT) Caps capsule Commonly known as: DRISDOL Take 50,000 Units by mouth every Friday. Notes to patient: 7/17   Vitamin D3 50 MCG (2000 UT) capsule Take 2,000 Units by mouth daily. Notes to  patient: 7/17            Durable Medical Equipment  (From admission, onward)         Start     Ordered   07/15/19 1227  For home use only DME oxygen  Once    Question Answer Comment  Length of Need 6 Months   Mode or (Route) Nasal cannula   Liters per Minute 2   Frequency Continuous (stationary and portable oxygen unit needed)   Oxygen conserving device Yes   Oxygen delivery system Gas      07/15/19 1226   07/15/19 0456  For home use only DME Walker rolling  Once    Comments: 5" wheels  Question:  Patient needs a walker to treat with the following condition  Answer:  Ambulatory dysfunction   07/15/19 0455         Allergies  Allergen Reactions   Levaquin [Levofloxacin In D5w] Other (See Comments)    Unknown rxn   Lisinopril Swelling   Follow-up Information    [name], NP. Go on 07/23/2019.   Specialty: Family Medicine Why: Appointment at 10 am on July 23 2019 Contact information: [place] [address] [phone]        [facility], [facility] Follow up.   Specialty: [place] Why: They will follow up with you for home health PT and OT Contact information: [place] [address] [phone]            The results of significant diagnostics from this hospitalization (including imaging, microbiology, ancillary and laboratory) are listed below for reference.    Significant Diagnostic Studies: Ct Head Wo Contrast  Result Date: 07/04/2019 CLINICAL DATA:  Patient found on the floor.  Disoriented. EXAM: CT HEAD WITHOUT CONTRAST CT CERVICAL SPINE WITHOUT CONTRAST TECHNIQUE: Multidetector CT imaging of the head and cervical spine was performed following the standard protocol without intravenous contrast. Multiplanar CT image reconstructions of the cervical spine were also generated. COMPARISON:  Brain and cervical spine CT 04/08/2019. FINDINGS: CT HEAD FINDINGS Brain: Ventricles and sulci  are appropriate for patient's age. No evidence for acute cortically based infarct, intracranial hemorrhage, mass lesion or mass-effect. Periventricular and subcortical white matter hypodensities compatible with chronic microvascular ischemic changes. Vascular: Unremarkable Skull: Intact. Sinuses/Orbits: Mucosal thickening involving the paranasal sinuses. Mastoid air cells are unremarkable. Orbits are unremarkable. Other: None. CT CERVICAL SPINE FINDINGS Alignment: Straightening of the normal cervical lordosis. Skull base and vertebrae: Intact. Soft tissues and spinal canal: No prevertebral fluid or swelling. No visible canal hematoma. Disc levels: Degenerative disc disease most pronounced C4-5, C5-6 and C6-7.  Preservation of the vertebral body heights. No acute fracture. Upper chest: Unremarkable Other: Bilateral carotid bifurcation calcified atherosclerotic plaque. IMPRESSION: No acute intracranial process. Atrophy and chronic microvascular ischemic changes. No acute cervical spine fracture. Multilevel degenerative disc disease. Electronically Signed   By: Lovey Newcomer M.D.   On: 07/04/2019 21:21   Ct Cervical Spine Wo Contrast  Result Date: 07/04/2019 CLINICAL DATA:  Patient found on the floor.  Disoriented. EXAM: CT HEAD WITHOUT CONTRAST CT CERVICAL SPINE WITHOUT CONTRAST TECHNIQUE: Multidetector CT imaging of the head and cervical spine was performed following the standard protocol without intravenous contrast. Multiplanar CT image reconstructions of the cervical spine were also generated. COMPARISON:  Brain and cervical spine CT 04/08/2019. FINDINGS: CT HEAD FINDINGS Brain: Ventricles and sulci are appropriate for patient's age. No evidence for acute cortically based infarct, intracranial hemorrhage, mass lesion or mass-effect. Periventricular and subcortical white matter hypodensities compatible with chronic microvascular ischemic changes. Vascular: Unremarkable Skull: Intact. Sinuses/Orbits: Mucosal  thickening involving the paranasal sinuses. Mastoid air cells are unremarkable. Orbits are unremarkable. Other: None. CT CERVICAL SPINE FINDINGS Alignment: Straightening of the normal cervical lordosis. Skull base and vertebrae: Intact. Soft tissues and spinal canal: No prevertebral fluid or swelling. No visible canal hematoma. Disc levels: Degenerative disc disease most pronounced C4-5, C5-6 and C6-7. Preservation of the vertebral body heights. No acute fracture. Upper chest: Unremarkable Other: Bilateral carotid bifurcation calcified atherosclerotic plaque. IMPRESSION: No acute intracranial process. Atrophy and chronic microvascular ischemic changes. No acute cervical spine fracture. Multilevel degenerative disc disease. Electronically Signed   By: Lovey Newcomer M.D.   On: 07/04/2019 21:21   US Renal  Result Date: 07/09/2019 CLINICAL DATA:  Acute renal failure. EXAM: RENAL / URINARY TRACT ULTRASOUND COMPLETE COMPARISON:  PET CT 05/26/2019 FINDINGS: Right Kidney: Renal measurements: 11 x 6.6 x 7.3 cm = volume: 179.7 mL. No hydronephrosis. Complex cyst in the mid right kidney measuring 7 x 3.9 x 5.2 cm. Stone on prior PET-CT is not visualized. Left Kidney: Renal measurements: 10 x 6.2 x 5.5 cm = volume: 179 mL. Echogenicity within normal limits. No mass or hydronephrosis visualized. Bladder: Appears normal for degree of bladder distention. IMPRESSION: 1. No obstructive uropathy. 2. Right renal cyst. Right nephrolithiasis on prior PET CT not demonstrated sonographically. Electronically Signed   By: Keith Rake M.D.   On: 07/09/2019 00:00   Dg Chest Port 1 View  Result Date: 07/12/2019 CLINICAL DATA:  Wheezing and shortness of breath. EXAM: PORTABLE CHEST 1 VIEW COMPARISON:  Chest CT April 22nd 2020 FINDINGS: Cardiomediastinal silhouette is normal. Mediastinal contours appear intact. Right pulmonary masses are not significantly changed from the prior studies. Right pleural thickening versus small pleural  effusion. Bibasilar atelectasis. Osseous structures are without acute abnormality. Soft tissues are grossly normal. IMPRESSION: 1. Right pulmonary masses are not significantly changed from the prior studies. 2. Right pleural thickening versus small pleural effusion. 3. Bibasilar atelectasis. Electronically Signed   By: Fidela Salisbury M.D.   On: 07/12/2019 09:11   Dg Chest Port 1 View  Result Date: 07/08/2019 CLINICAL DATA:  Shortness of breath and fever. EXAM: PORTABLE CHEST 1 VIEW COMPARISON:  PET-CT May 26, 2019 and chest radiograph April 28, 2019 FINDINGS: The previously noted right lower lobe mass is again apparent, measuring 5.1 x 3.8 cm. There is no appreciable edema or consolidation. Heart is mildly enlarged with pulmonary vascularity normal. No adenopathy evident. No bone lesions. IMPRESSION: Persistent right lower lobe mass. No new opacity evident. Heart mildly enlarged. No  adenopathy demonstrable by radiography. Electronically Signed   By: Lowella Grip III M.D.   On: 07/08/2019 11:33    Microbiology: Recent Results (from the past 240 hour(s))  SARS Coronavirus 2 (Performed in [place] hospital lab)     Status: None   Collection Time: 07/05/19  3:50 PM  Result Value Ref Range Status   SARS Coronavirus 2 NEGATIVE NEGATIVE Final    Comment: (NOTE) SARS-CoV-2 target nucleic acids are NOT DETECTED. The SARS-CoV-2 RNA is generally detectable in upper and lower respiratory specimens during the acute phase of infection. Negative results do not preclude SARS-CoV-2 infection, do not rule out co-infections with other pathogens, and should not be used as the sole basis for treatment or other patient management decisions. Negative results must be combined with clinical observations, patient history, and epidemiological information. The expected result is Negative. Fact Sheet for Patients: SugarRoll.be Fact Sheet for Healthcare  Providers: https://www.woods-mathews.com/ This test is not yet approved or cleared by the [place] FDA and  has been authorized for detection and/or diagnosis of SARS-CoV-2 by FDA under an Emergency Use Authorization (EUA). This EUA will remain  in effect (meaning this test can be used) for the duration of the COVID-19 declaration under Section 56 4(b)(1) of the Act, 21 U.S.C. section 360bbb-3(b)(1), unless the authorization is terminated or revoked sooner. Performed at [facility], [place] [address], [place], [place] [address]   Culture, blood (routine x 2)     Status: Abnormal   Collection Time: 07/08/19 10:53 AM   Specimen: BLOOD  Result Value Ref Range Status   Specimen Description BLOOD RIGHT ANTECUBITAL  Final   Special Requests   Final    BOTTLES DRAWN AEROBIC AND ANAEROBIC Blood Culture adequate volume   Culture  Setup Time (A)  Final    GRAM VARIABLE ROD ANAEROBIC BOTTLE ONLY CRITICAL RESULT CALLED TO, READ BACK BY AND VERIFIED WITH: Susy Manor [id number] [place] Performed at [facility], [address], [place], [place] [address]    Culture BACILLUS SPECIES (A)  Final   Report Status 07/10/2019 FINAL  Final  Blood Culture ID Panel (Reflexed)     Status: None   Collection Time: 07/08/19 10:53 AM  Result Value Ref Range Status   Enterococcus species NOT DETECTED NOT DETECTED Final    Comment: CRITICAL RESULT CALLED TO, READ BACK BY AND VERIFIED WITH: PHARMD JEREMY FRENS [id number] FCP    Listeria monocytogenes NOT DETECTED NOT DETECTED Final   Staphylococcus species NOT DETECTED NOT DETECTED Final   Staphylococcus aureus (BCID) NOT DETECTED NOT DETECTED Final   Streptococcus species NOT DETECTED NOT DETECTED Final   Streptococcus agalactiae NOT DETECTED NOT DETECTED Final   Streptococcus pneumoniae NOT DETECTED NOT DETECTED Final   Streptococcus pyogenes NOT DETECTED NOT DETECTED Final   Acinetobacter baumannii NOT DETECTED NOT  DETECTED Final   Enterobacteriaceae species NOT DETECTED NOT DETECTED Final   Enterobacter cloacae complex NOT DETECTED NOT DETECTED Final   Escherichia coli NOT DETECTED NOT DETECTED Final   Klebsiella oxytoca NOT DETECTED NOT DETECTED Final   Klebsiella pneumoniae NOT DETECTED NOT DETECTED Final   Proteus species NOT DETECTED NOT DETECTED Final   Serratia marcescens NOT DETECTED NOT DETECTED Final   Haemophilus influenzae NOT DETECTED NOT DETECTED Final   Neisseria meningitidis NOT DETECTED NOT DETECTED Final   Pseudomonas aeruginosa NOT DETECTED NOT DETECTED Final   Candida albicans NOT DETECTED NOT DETECTED Final   Candida glabrata NOT DETECTED  NOT DETECTED Final   Candida krusei NOT DETECTED NOT DETECTED Final   Candida parapsilosis NOT DETECTED NOT DETECTED Final   Candida tropicalis NOT DETECTED NOT DETECTED Final    Comment: Performed at [facility], [address], [place], [place] [address]  Culture, blood (routine x 2)     Status: None   Collection Time: 07/08/19 11:30 AM   Specimen: BLOOD RIGHT HAND  Result Value Ref Range Status   Specimen Description BLOOD RIGHT HAND  Final   Special Requests   Final    BOTTLES DRAWN AEROBIC ONLY Blood Culture adequate volume   Culture   Final    NO GROWTH 5 DAYS Performed at [facility], [place] [address], [place], [place] [address]    Report Status 07/13/2019 FINAL  Final  SARS Coronavirus 2 (CEPHEID - Performed in [place] hospital lab), Hosp Order     Status: None   Collection Time: 07/08/19 11:34 AM   Specimen: Nasopharyngeal Swab  Result Value Ref Range Status   SARS Coronavirus 2 NEGATIVE NEGATIVE Final    Comment: (NOTE) If result is NEGATIVE SARS-CoV-2 target nucleic acids are NOT DETECTED. The SARS-CoV-2 RNA is generally detectable in upper and lower  respiratory specimens during the acute phase of infection. The lowest  concentration of SARS-CoV-2 viral copies this assay can detect is 250  copies / mL. A  negative result does not preclude SARS-CoV-2 infection  and should not be used as the sole basis for treatment or other  patient management decisions.  A negative result may occur with  improper specimen collection / handling, submission of specimen other  than nasopharyngeal swab, presence of viral mutation(s) within the  areas targeted by this assay, and inadequate number of viral copies  (<250 copies / mL). A negative result must be combined with clinical  observations, patient history, and epidemiological information. If result is POSITIVE SARS-CoV-2 target nucleic acids are DETECTED. The SARS-CoV-2 RNA is generally detectable in upper and lower  respiratory specimens dur ing the acute phase of infection.  Positive  results are indicative of active infection with SARS-CoV-2.  Clinical  correlation with patient history and other diagnostic information is  necessary to determine patient infection status.  Positive results do  not rule out bacterial infection or co-infection with other viruses. If result is PRESUMPTIVE POSTIVE SARS-CoV-2 nucleic acids MAY BE PRESENT.   A presumptive positive result was obtained on the submitted specimen  and confirmed on repeat testing.  While 2019 novel coronavirus  (SARS-CoV-2) nucleic acids may be present in the submitted sample  additional confirmatory testing may be necessary for epidemiological  and / or clinical management purposes  to differentiate between  SARS-CoV-2 and other Sarbecovirus currently known to infect humans.  If clinically indicated additional testing with an alternate test  methodology [phone]) is advised. The SARS-CoV-2 RNA is generally  detectable in upper and lower respiratory sp ecimens during the acute  phase of infection. The expected result is Negative. Fact Sheet for Patients:  StrictlyIdeas.no Fact Sheet for Healthcare Providers: BankingDealers.co.za This test is not  yet approved or cleared by the [place] FDA and has been authorized for detection and/or diagnosis of SARS-CoV-2 by FDA under an Emergency Use Authorization (EUA).  This EUA will remain in effect (meaning this test can be used) for the duration of the COVID-19 declaration under Section 564(b)(1) of the Act, 21 U.S.C. section 360bbb-3(b)(1), unless the authorization is terminated or revoked sooner. Performed at  [facility], [place] [address] [place], [place] [address]   Urine culture     Status: None   Collection Time: 07/08/19 12:32 PM   Specimen: Urine, Clean Catch  Result Value Ref Range Status   Specimen Description URINE, CLEAN CATCH  Final   Special Requests NONE  Final   Culture   Final    NO GROWTH Performed at [facility], [place] [address] [place], [place] [address]    Report Status 07/09/2019 FINAL  Final  Culture, blood (routine x 2)     Status: None   Collection Time: 07/10/19  1:25 PM   Specimen: BLOOD  Result Value Ref Range Status   Specimen Description BLOOD RIGHT ANTECUBITAL  Final   Special Requests   Final    BOTTLES DRAWN AEROBIC ONLY Blood Culture adequate volume   Culture   Final    NO GROWTH 5 DAYS Performed at [facility], [place] [address], [place], [place] [address]    Report Status 07/15/2019 FINAL  Final  Culture, blood (routine x 2)     Status: None   Collection Time: 07/10/19  6:17 PM   Specimen: BLOOD LEFT HAND  Result Value Ref Range Status   Specimen Description BLOOD LEFT HAND  Final   Special Requests   Final    BOTTLES DRAWN AEROBIC AND ANAEROBIC Blood Culture results may not be optimal due to an inadequate volume of blood received in culture bottles   Culture   Final    NO GROWTH 5 DAYS Performed at [facility], [place] [address], [place], [place] [address]    Report Status 07/15/2019 FINAL  Final     Labs: Basic Metabolic Panel: Recent Labs  Lab 07/10/19 1829 07/11/19 0238 07/12/19 0343 07/13/19 0741  07/14/19 0334 07/15/19 0427  NA  --  146* 145 146* 142 143  K 3.1* 3.4* 3.3* 3.5 3.2* 4.4  CL  --  113* 110 111 107 107  CO2  --  24 24 24 26 28   GLUCOSE  --  123* 83 102* 98 92  BUN  --  28* 14 9 9  7*  CREATININE  --  2.08* 1.53* 1.49* 1.37* 1.26*  CALCIUM  --  8.1* 7.7* 8.2* 8.4* 8.7*  MG 1.5*  --  1.3* 1.3* 1.2* 1.8   Liver Function Tests: No results for input(s): AST, ALT, ALKPHOS, BILITOT, PROT, ALBUMIN in the last 168 hours. No results for input(s): LIPASE, AMYLASE in the last 168 hours. No results for input(s): AMMONIA in the last 168 hours. CBC: Recent Labs  Lab 07/09/19 0117 07/10/19 0732 07/11/19 0238 07/13/19 0741  WBC 24.2* 21.9* 18.1* 7.3  HGB 9.1* 9.3* 8.6* 8.5*  HCT 27.8* 29.1* 26.4* 26.7*  MCV 92.4 93.0 91.7 93.7  PLT 181 198 200 218   Cardiac Enzymes: No results for input(s): CKTOTAL, CKMB, CKMBINDEX, TROPONINI in the last 168 hours. BNP: BNP (last 3 results) Recent Labs    04/08/19 0310 04/28/19 1529  BNP 61.0 49.5    ProBNP (last 3 results) No results for input(s): PROBNP in the last 8760 hours.  CBG: Recent Labs  Lab 07/14/19 0614 07/14/19 1135 07/14/19 1620 07/14/19 2116 07/15/19 0613  GLUCAP [id number] 148* 106* 107* 90       Signed:  Kayleen Memos, MD [facility] 07/15/2019, 3:12 PM

## 2019-07-15 NOTE — Progress Notes (Signed)
Currently waiting on oxygen for pt to go home with.

## 2019-07-15 NOTE — Progress Notes (Signed)
No oxygen was delivered at this time. Pt is currently sitting in his chair on 2L nasal canula at 92% oxygen saturation. Will speak to let him know that he will not be able to go home today. pt's daughter Loyed Wilmes was made aware. Questions and concerns were answered.

## 2019-07-15 NOTE — Progress Notes (Signed)
Occupational Therapy Treatment Patient Details Name: John [name] MRN: [id number] DOB: 25-Jun-1946 Today's Date: 07/15/2019    History of present illness 73 yo admitted with AMS and AKI. PMhx: CAD, COPD on home O2, small cell lung CA, CHF, HTN, HLD, dementia, DM   OT comments  Pt initially stating he did not want to get OOB, but with encouragement he walked in room with RW, performed seated grooming and toileted. Pt continues to fatigue easily. May benefits from education in use of AE for LB bathing and dressing for energy conservation.   Follow Up Recommendations  Home health OT;Supervision/Assistance - 24 hour    Equipment Recommendations  None recommended by OT    Recommendations for Other Services      Precautions / Restrictions Precautions Precautions: Fall Precaution Comments: watch O2 Restrictions Weight Bearing Restrictions: No       Mobility Bed Mobility Overal bed mobility: Modified Independent                Transfers Overall transfer level: Needs assistance Equipment used: Rolling walker (2 wheeled) Transfers: Sit to/from Stand Sit to Stand: Min guard         General transfer comment: wide base of support, min guard from bed and chair    Balance Overall balance assessment: Needs assistance Sitting-balance support: No upper extremity supported;Feet supported Sitting balance-Leahy Scale: Good       Standing balance-Leahy Scale: Poor Standing balance comment: reliant on at least one hand stabilization while using urinal                           ADL either performed or assessed with clinical judgement   ADL Overall ADL's : Needs assistance/impaired     Grooming: Wash/dry hands;Wash/dry face;Oral care;Sitting;Set up           Upper Body Dressing : Set up;Sitting Upper Body Dressing Details (indicate cue type and reason): front opening gown Lower Body Dressing: Minimal assistance;Sit to/from stand Lower Body Dressing  Details (indicate cue type and reason): pt is interested in education with AE for energy conservation Toilet Transfer: Min guard;RW;Ambulation   Toileting- Clothing Manipulation and Hygiene: Min guard;Sit to/from stand Toileting - Clothing Manipulation Details (indicate cue type and reason): sitting using urinal     Functional mobility during ADLs: Min guard;Rolling walker       Vision       Perception     Praxis      Cognition Arousal/Alertness: Awake/alert Behavior During Therapy: WFL for tasks assessed/performed Overall Cognitive Status: No family/caregiver present to determine baseline cognitive functioning Area of Impairment: Safety/judgement                         Safety/Judgement: Decreased awareness of safety;Decreased awareness of deficits              Exercises     Shoulder Instructions       General Comments      Pertinent Vitals/ Pain       Pain Assessment: Faces Faces Pain Scale: Hurts little more Pain Location: LEs Pain Descriptors / Indicators: Aching Pain Intervention(s): Monitored during session;Repositioned  Home Living                                          Prior Functioning/Environment  Frequency  Min 2X/week        Progress Toward Goals  OT Goals(current goals can now be found in the care plan section)  Progress towards OT goals: Progressing toward goals  Acute Rehab OT Goals Patient Stated Goal: return home OT Goal Formulation: With patient Time For Goal Achievement: 07/26/19 Potential to Achieve Goals: Good  Plan Discharge plan remains appropriate    Co-evaluation                 AM-PAC OT "6 Clicks" Daily Activity     Outcome Measure   Help from another person eating meals?: None Help from another person taking care of personal grooming?: A Little Help from another person toileting, which includes using toliet, bedpan, or urinal?: A Little Help from another  person bathing (including washing, rinsing, drying)?: A Little Help from another person to put on and taking off regular upper body clothing?: None Help from another person to put on and taking off regular lower body clothing?: A Little 6 Click Score: 20    End of Session Equipment Utilized During Treatment: Rolling walker;Gait belt;Oxygen  OT Visit Diagnosis: Unsteadiness on feet (R26.81);Muscle weakness (generalized) (M62.81)   Activity Tolerance Patient tolerated treatment well   Patient Left in chair;with call bell/phone within reach;with chair alarm set   Nurse Communication          Time: 9150-5697 OT Time Calculation (min): 34 min  Charges: OT General Charges $OT Visit: 1 Visit OT Treatments $Self Care/Home Management : 23-37 mins  Nestor Lewandowsky, OTR/L Acute Rehabilitation Services Pager: [phone] Office: [phone]   Malka So 07/15/2019, 11:37 AM

## 2019-07-15 NOTE — TOC Transition Note (Addendum)
Transition of Care [facility]) - CM/SW Discharge Note   Patient Details  Name: John [name] MRN: [id number] Date of Birth: May 07, 1946  [facility] CM/SW Contact:  Candie Chroman, LCSW Phone Number: 07/15/2019, 12:38 PM   Clinical Narrative: Patient already has home oxygen at 2L. Called daughter to let her know patient is discharging. She is agreeable to rolling walker. Called [employer] to order. Left message for [name] rep to let him know patient is discharging today. No further concerns. CSW signing off.    3:40 pm: Per RN, patient needs an oxygen tank to get him home. Daughter is working at [employer] and they typically use the bus system. CSW called Zack with [employer]. He will check to see if patient already gets oxygen through them. If so, he will be able to bring him another tank to use for ride home.  4:42 pm: Filled out cab voucher. Will give to RN.  Final next level of care: [place] Barriers to Discharge: Barriers Resolved   Patient Goals and CMS Choice Patient states their goals for this hospitalization and ongoing recovery are:: Patient not fully oriented.      Discharge Placement                  Name of family member notified: [name] Patient and family notified of of transfer: 07/15/19  Discharge Plan and Services                DME Arranged: Gilford Rile rolling DME Agency: [employer] Date DME Agency Contacted: 07/15/19   Representative spoke with at DME Agency: [place]: PT, OT [place] Agency: [place] Date [place]: 07/15/19   Representative spoke with at [place]: [place] (SDOH) Interventions     Readmission Risk Interventions Readmission Risk Prevention Plan 07/09/2019 04/30/2019 04/30/2019  Transportation Screening Complete - Complete  Medication Review Press photographer) Complete - Complete  PCP or Specialist appointment within 3-5 days of  discharge - Complete -  [place] or Home Care Consult Complete - Complete  SW Recovery Care/Counseling Consult Complete - -  Palliative Care Screening Not Applicable - Not [place] Not Applicable - Not Applicable  Some recent data might be hidden

## 2019-07-15 NOTE — Progress Notes (Signed)
Oncology Nurse Navigator Documentation  Oncology Nurse Navigator Flowsheets 07/15/2019  Navigator Location [facility]  Navigator Encounter Type Other/I received notification that patient was unable to get his biopsy due to his confusion.  I updated Dr. Julien Nordmann and would like to see John [name] this week to discuss what he would like to do regarding treatment plan.  I updated scheduling team to call and schedule an appt next Wednesday with Cassi per Dr. Julien Nordmann request.   Telephone -  Treatment Phase Abnormal Scans  Barriers/Navigation Needs Coordination of Care  Education -  Interventions Coordination of Care  Coordination of Care Other  Education Method -  Acuity Level 3  Time Spent with Patient 30

## 2019-07-15 NOTE — Progress Notes (Signed)
SATURATION QUALIFICATIONS: (This note is used to comply with regulatory documentation for home oxygen)  Patient Saturations on Room Air at Rest = 77%  Patient Saturations on Room Air while Ambulating = 80%  Patient Saturations on 2 Liters of oxygen while sitting 91 = %  Please briefly explain why patient needs home oxygen need continue:

## 2019-07-15 NOTE — Telephone Encounter (Signed)
Scheduled appt per 7/16 sch message - pt wife aware of appt.

## 2019-07-15 NOTE — Discharge Instructions (Signed)
Acute Kidney Injury, Adult ° °Acute kidney injury is a sudden worsening of kidney function. The kidneys are organs that have several jobs. They filter the blood to remove waste products and extra fluid. They also maintain a healthy balance of minerals and hormones in the body, which helps control blood pressure and keep bones strong. With this condition, your kidneys do not do their jobs as well as they should. °This condition ranges from mild to severe. Over time it may develop into long-lasting (chronic) kidney disease. Early detection and treatment may prevent acute kidney injury from developing into a chronic condition. °What are the causes? °Common causes of this condition include: °· A problem with blood flow to the kidneys. This may be caused by: °? Low blood pressure (hypotension) or shock. °? Blood loss. °? Heart and blood vessel (cardiovascular) disease. °? Severe burns. °? Liver disease. °· Direct damage to the kidneys. This may be caused by: °? Certain medicines. °? A kidney infection. °? Poisoning. °? Being around or in contact with toxic substances. °? A surgical wound. °? A hard, direct hit to the kidney area. °· A sudden blockage of urine flow. This may be caused by: °? Cancer. °? Kidney stones. °? An enlarged prostate in males. °What are the signs or symptoms? °Symptoms of this condition may not be obvious until the condition becomes severe. Symptoms of this condition can include: °· Tiredness (lethargy), or difficulty staying awake. °· Nausea or vomiting. °· Swelling (edema) of the face, legs, ankles, or feet. °· Problems with urination, such as: °? Abdominal pain, or pain along the side of your stomach (flank). °? Decreased urine production. °? Decrease in the force of urine flow. °· Muscle twitches and cramps, especially in the legs. °· Confusion or trouble concentrating. °· Loss of appetite. °· Fever. °How is this diagnosed? °This condition may be diagnosed with tests, including: °· Blood  tests. °· Urine tests. °· Imaging tests. °· A test in which a sample of tissue is removed from the kidneys to be examined under a microscope (kidney biopsy). °How is this treated? °Treatment for this condition depends on the cause and how severe the condition is. In mild cases, treatment may not be needed. The kidneys may heal on their own. In more severe cases, treatment will involve: °· Treating the cause of the kidney injury. This may involve changing any medicines you are taking or adjusting your dosage. °· Fluids. You may need specialized IV fluids to balance your body's needs. °· Having a catheter placed to drain urine and prevent blockages. °· Preventing problems from occurring. This may mean avoiding certain medicines or procedures that can cause further injury to the kidneys. °In some cases treatment may also require: °· A procedure to remove toxic wastes from the body (dialysis or continuous renal replacement therapy - CRRT). °· Surgery. This may be done to repair a torn kidney, or to remove the blockage from the urinary system. °Follow these instructions at home: °Medicines °· Take over-the-counter and prescription medicines only as told by your health care provider. °· Do not take any new medicines without your health care provider's approval. Many medicines can worsen your kidney damage. °· Do not take any vitamin and mineral supplements without your health care provider's approval. Many nutritional supplements can worsen your kidney damage. °Lifestyle °· If your health care provider prescribed changes to your diet, follow them. You may need to decrease the amount of protein you eat. °· Achieve and maintain a healthy   weight. If you need help with this, ask your health care provider. °· Start or continue an exercise plan. Try to exercise at least 30 minutes a day, 5 days a week. °· Do not use any tobacco products, such as cigarettes, chewing tobacco, and e-cigarettes. If you need help quitting, ask your  health care provider. °General instructions °· Keep track of your blood pressure. Report changes in your blood pressure as told by your health care provider. °· Stay up to date with immunizations. Ask your health care provider which immunizations you need. °· Keep all follow-up visits as told by your health care provider. This is important. °Where to find more information °· American Association of Kidney Patients: www.aakp.org °· National Kidney Foundation: www.kidney.org °· American Kidney Fund: www.akfinc.org °· [employer] Rehabilitation Program: °? www.lifeoptions.org °? www.kidneyschool.org °Contact a health care provider if: °· Your symptoms get worse. °· You develop new symptoms. °Get help right away if: °· You develop symptoms of worsening kidney disease, which include: °? Headaches. °? Abnormally dark or light skin. °? Easy bruising. °? Frequent hiccups. °? Chest pain. °? Shortness of breath. °? End of menstruation in women. °? Seizures. °? Confusion or altered mental status. °? Abdominal or back pain. °? Itchiness. °· You have a fever. °· Your body is producing less urine. °· You have pain or bleeding when you urinate. °Summary °· Acute kidney injury is a sudden worsening of kidney function. °· Acute kidney injury can be caused by problems with blood flow to the kidneys, direct damage to the kidneys, and sudden blockage of urine flow. °· Symptoms of this condition may not be obvious until it becomes severe. Symptoms may include edema, lethargy, confusion, nausea or vomiting, and problems passing urine. °· This condition can usually be diagnosed with blood tests, urine tests, and imaging tests. Sometimes a kidney biopsy is done to diagnose this condition. °· Treatment for this condition often involves treating the underlying cause. It is treated with fluids, medicines, dialysis, diet changes, or surgery. °This information is not intended to replace advice given to you by your health care provider. Make  sure you discuss any questions you have with your health care provider. °Document Released: 07/01/2011 Document Revised: 11/28/2017 Document Reviewed: 12/06/2016 °Elsevier Patient Education © 2020 [employer] ° °

## 2019-07-16 LAB — GLUCOSE, CAPILLARY
Glucose-Capillary: 159 mg/dL — ABNORMAL HIGH (ref 70–99)
Glucose-Capillary: 104 mg/dL — ABNORMAL HIGH (ref 70–99)
Glucose-Capillary: 99 mg/dL (ref 70–99)
Glucose-Capillary: 115 mg/dL — ABNORMAL HIGH (ref 70–99)

## 2019-07-16 NOTE — Progress Notes (Signed)
Spoke with pt's daughter: Dutch Ing to let her know that pt's oxygen is at bedside.  Pt's daughter states that she will come to pick up patient at 1630.

## 2019-07-16 NOTE — Discharge Summary (Addendum)
Discharge Summary  John [name] QIH:[id number] DOB: [birth year]  PCP: [name], NP  Admit date: 07/08/2019 Discharge date: 07/16/2019  Time spent: 35 minutes  Recommendations for Outpatient Follow-up:  1. Follow-up with your primary care provider 2. Follow-up with your pulmonologist 3. Follow-up with your oncologist 4. Take your medications as prescribed 5. Continue physical therapy 6. Fall precaution  Discharge Diagnoses:  Active Hospital Problems   Diagnosis Date Noted  . Acute renal failure (ARF) ([place]) 07/08/2019  . Hypokalemia 07/08/2019  . Vascular dementia without behavioral disturbance ([place])   . Hypotension 04/20/2019  . CKD (chronic kidney disease), stage III ([place]) 04/08/2019  . Primary cancer of right lower lobe of lung ([place]) 06/14/2015  . COPD II/III with reversibility  12/19/2012  . Chronic diastolic CHF (congestive heart failure) ([place]) 02/22/2012  . HTN (hypertension) 02/22/2012    Resolved Hospital Problems  No resolved problems to display.    Discharge Condition: Stable  Diet recommendation: Heart healthy low-salt carb modified diet.  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 Patient presented secondary to altered mental status and found to have hypotension, acute kidney injury, electrolyte abnormalities.  07/16/19: Patient was seen and examined at his bedside.  No acute events overnight.  He wants to go home.  He has no new complaints.    On the day of discharge, the patient was hemodynamically stable.  He will need to follow-up with his primary care provider, oncologist, and pulmonologist posthospitalization.   He will also need to continue physical therapy as tolerated.  Fall precautions.     Hospital Course:  Principal Problem:   Acute renal failure (ARF) (H[facility]) Active Problems:   Chronic diastolic CHF (congestive heart failure) (H[facility])   HTN (hypertension)   COPD II/III with reversibility    Primary cancer of right lower lobe of lung (H[facility])   CKD (chronic kidney disease), stage III (HCC)   Hypotension   Vascular dementia without behavioral disturbance (HCC)   Hypokalemia  Resolved AKI on CKD 3 Baseline creatinine appears to be 1.2 with GFR >60 Presented with creatinine of 4.54 Creatinine on 07/15/2019 was 1.26 with GFR greater than 60 Continue to avoid nephrotoxins Avoid dehydration Follow-up with your primary care provider  Acute on chronic hypoxic respiratory failure On 2 L of oxygen at baseline Home O2 evaluation results as followed: SATURATION QUALIFICATIONS: (This note is used to comply with regulatory documentation for home oxygen)  Patient Saturations on Room Air at Rest = 77%  Patient Saturations on Room Air while Ambulating = 80%  Patient Saturations on 2 Liters of oxygen while sitting 91 = %  Maintain O2 saturation greater than 92%  Resolved hypokalemia post repletion  Potassium 3.2>>4.4  Resolved hypomagnesemia post repletion  Magnesium 1.2>>1.8  Hypertension Blood pressure is at goal  Continue amlodipine 10 mg daily and bisoprolol 5 mg daily  Lasix and spironolactone are being held to avoid hypotension  Type 2 diabetes Continue insulin sliding scale Last hemoglobin A1c 5.8 on 08/05/5642  Chronic diastolic CHF Stable at this time Resume home Lasix 20 mg p.o. Monday Wednesday and Friday Follow-up with your PCP outpatient Recommend low salt  diet  Right lower lobe lung carcinoma Maintain O2 saturation greater than 92% Continue Brio Ellipta Follow-up with your oncologist outpatient    Code Status:Full Code   Procedures:  None   Consultations:  None  Discharge Exam: BP 139/67 (BP Location: Left Arm)   Pulse 84   Temp 98.4 F (36.9 C) (Oral)   Resp 18   Ht 5\' 7"  (1.702 m)   Wt 87.9 kg Comment: scale a  SpO2 96%   BMI 30.35 kg/m  . General: 73 y.o. year-old male well-developed well-nourished in no acute distress.  Alert and interactive.   . Cardiovascular: Regular rate and rhythm.  No rubs or gallops.  No JVD or thyromegaly. Marland Kitchen Respiratory: Clear to auscultation with no wheezes or rales.  Good inspiratory effort. . Abdomen: Soft nontender nondistended with normal bowel sounds present.. . Musculoskeletal: Trace lower extremity edema.  2 out of 4 pulses in all 4 extremities. Marland Kitchen Psychiatry: Mood is appropriate for condition and setting.  Discharge Instructions You were cared for by a hospitalist during your hospital stay. If you have any questions about your discharge medications or the care you received while you were in the hospital after you are discharged, you can call the unit and asked to speak with the hospitalist on call if the hospitalist that took care of you is not available. Once you are discharged, your primary care physician will handle any further medical issues. Please note that NO REFILLS for any discharge medications will be authorized once you are discharged, as it is imperative that you return to your primary care physician (or establish a relationship with a primary care physician if you do not have one) for your aftercare needs so that they can reassess your need for medications and monitor your lab values.   Allergies as of 07/16/2019      Reactions   Levaquin [levofloxacin In D5w] Other (See Comments)   Unknown rxn   Lisinopril Swelling      Medication List    STOP taking these medications   budesonide-formoterol 160-4.5 MCG/ACT inhaler Commonly known as: Symbicort   cloNIDine 0.1 MG tablet Commonly known as: CATAPRES   naproxen 500 MG tablet Commonly known as: NAPROSYN    spironolactone 25 MG tablet Commonly known as: ALDACTONE     TAKE these medications   albuterol 108 (90 Base) MCG/ACT inhaler Commonly known as: ProAir HFA INHALE 2 PUFFS BY MOUTH EVERY 4 HOURS AS NEEDED FOR WHEEZING What changed:   how much to take  how to take this  when to take this  reasons to take this  additional instructions   allopurinol 300 MG tablet Commonly known as: ZYLOPRIM Take 300 mg by mouth daily.   amLODipine 10 MG tablet Commonly known as: NORVASC Take 10 mg by mouth daily.   atorvastatin 20 MG tablet Commonly known as: LIPITOR Take 1 tablet (20 mg total) by mouth daily.   bisoprolol 5 MG tablet Commonly known as: ZEBETA Take 1 tablet (5 mg total) by mouth daily. Notes to patient: 7/17   Breo Ellipta 100-25 MCG/INH Aepb Generic drug: fluticasone furoate-vilanterol Inhale 1 puff into the lungs 2 (two) times a day.   ferrous sulfate 325 (65 FE) MG tablet Take 1 tablet (325 mg total) by mouth every Monday, Wednesday, and Friday. Notes to patient: 5/78   folic acid 1 MG tablet Commonly known as: FOLVITE Take 1 tablet (1 mg total) by mouth daily. Notes to patient: 7/17   furosemide 20 MG  tablet Commonly known as: LASIX Take 1 tablet (20 mg total) by mouth every Monday, Wednesday, and Friday. Notes to patient: 7/17   metFORMIN 1000 MG tablet Commonly known as: GLUCOPHAGE Take 0.5 tablets (500 mg total) by mouth 2 (two) times daily.   pantoprazole 40 MG tablet Commonly known as: PROTONIX Take 1 tablet (40 mg total) by mouth 2 (two) times daily before a meal. Notes to patient: 7/16   QUEtiapine 25 MG tablet Commonly known as: SEROQUEL Take 1 tablet (25 mg total) by mouth 2 (two) times daily. Notes to patient: 7/16   senna-docusate 8.6-50 MG tablet Commonly known as: Senokot-S Take 1 tablet by mouth at bedtime. Notes to patient: 7/16   sucralfate 1 g tablet Commonly known as: CARAFATE Take 1 g by mouth See admin instructions. Four  time daily and at bedtime Notes to patient: 9pm   Vitamin D (Ergocalciferol) 1.25 MG (50000 UT) Caps capsule Commonly known as: DRISDOL Take 50,000 Units by mouth every Friday. Notes to patient: 7/17   Vitamin D3 50 MCG (2000 UT) capsule Take 2,000 Units by mouth daily. Notes to patient: 7/17            Durable Medical Equipment  (From admission, onward)         Start     Ordered   07/15/19 1227  For home use only DME oxygen  Once    Question Answer Comment  Length of Need 6 Months   Mode or (Route) Nasal cannula   Liters per Minute 2   Frequency Continuous (stationary and portable oxygen unit needed)   Oxygen conserving device Yes   Oxygen delivery system Gas      07/15/19 1226   07/15/19 0456  For home use only DME Walker rolling  Once    Comments: 5" wheels  Question:  Patient needs a walker to treat with the following condition  Answer:  Ambulatory dysfunction   07/15/19 0455         Allergies  Allergen Reactions  . Levaquin [Levofloxacin In D5w] Other (See Comments)    Unknown rxn  . Lisinopril Swelling   Follow-up Information    [name], NP. Go on 07/23/2019.   Specialty: Family Medicine Why: Appointment at 10 am on July 23 2019 Contact information: [place] [address] [phone]        [facility], [facility] Follow up.   Specialty: Home Health Services Why: They will follow up with you for home health PT and OT Contact information: [address] [place] [address] [phone]        Curt Bears, MD. Call in 1 day(s).   Specialty: Oncology Why: Please call for a post hospital follow-up appointment. Contact information: [place] [address] [phone]            The results of significant diagnostics from this hospitalization (including imaging, microbiology, ancillary and laboratory) are listed below for reference.    Significant Diagnostic  Studies: Ct Head Wo Contrast  Result Date: 07/04/2019 CLINICAL DATA:  Patient found on the floor.  Disoriented. EXAM: CT HEAD WITHOUT CONTRAST CT CERVICAL SPINE WITHOUT CONTRAST TECHNIQUE: Multidetector CT imaging of the head and cervical spine was performed following the standard protocol without intravenous contrast. Multiplanar CT image reconstructions of the cervical spine were also generated. COMPARISON:  Brain and cervical spine CT 04/08/2019. FINDINGS: CT HEAD FINDINGS Brain: Ventricles and sulci are appropriate for patient's age. No evidence  for acute cortically based infarct, intracranial hemorrhage, mass lesion or mass-effect. Periventricular and subcortical white matter hypodensities compatible with chronic microvascular ischemic changes. Vascular: Unremarkable Skull: Intact. Sinuses/Orbits: Mucosal thickening involving the paranasal sinuses. Mastoid air cells are unremarkable. Orbits are unremarkable. Other: None. CT CERVICAL SPINE FINDINGS Alignment: Straightening of the normal cervical lordosis. Skull base and vertebrae: Intact. Soft tissues and spinal canal: No prevertebral fluid or swelling. No visible canal hematoma. Disc levels: Degenerative disc disease most pronounced C4-5, C5-6 and C6-7. Preservation of the vertebral body heights. No acute fracture. Upper chest: Unremarkable Other: Bilateral carotid bifurcation calcified atherosclerotic plaque. IMPRESSION: No acute intracranial process. Atrophy and chronic microvascular ischemic changes. No acute cervical spine fracture. Multilevel degenerative disc disease. Electronically Signed   By: Lovey Newcomer M.D.   On: 07/04/2019 21:21   Ct Cervical Spine Wo Contrast  Result Date: 07/04/2019 CLINICAL DATA:  Patient found on the floor.  Disoriented. EXAM: CT HEAD WITHOUT CONTRAST CT CERVICAL SPINE WITHOUT CONTRAST TECHNIQUE: Multidetector CT imaging of the head and cervical spine was performed following the standard protocol without intravenous  contrast. Multiplanar CT image reconstructions of the cervical spine were also generated. COMPARISON:  Brain and cervical spine CT 04/08/2019. FINDINGS: CT HEAD FINDINGS Brain: Ventricles and sulci are appropriate for patient's age. No evidence for acute cortically based infarct, intracranial hemorrhage, mass lesion or mass-effect. Periventricular and subcortical white matter hypodensities compatible with chronic microvascular ischemic changes. Vascular: Unremarkable Skull: Intact. Sinuses/Orbits: Mucosal thickening involving the paranasal sinuses. Mastoid air cells are unremarkable. Orbits are unremarkable. Other: None. CT CERVICAL SPINE FINDINGS Alignment: Straightening of the normal cervical lordosis. Skull base and vertebrae: Intact. Soft tissues and spinal canal: No prevertebral fluid or swelling. No visible canal hematoma. Disc levels: Degenerative disc disease most pronounced C4-5, C5-6 and C6-7. Preservation of the vertebral body heights. No acute fracture. Upper chest: Unremarkable Other: Bilateral carotid bifurcation calcified atherosclerotic plaque. IMPRESSION: No acute intracranial process. Atrophy and chronic microvascular ischemic changes. No acute cervical spine fracture. Multilevel degenerative disc disease. Electronically Signed   By: Lovey Newcomer M.D.   On: 07/04/2019 21:21   US Renal  Result Date: 07/09/2019 CLINICAL DATA:  Acute renal failure. EXAM: RENAL / URINARY TRACT ULTRASOUND COMPLETE COMPARISON:  PET CT 05/26/2019 FINDINGS: Right Kidney: Renal measurements: 11 x 6.6 x 7.3 cm = volume: 179.7 mL. No hydronephrosis. Complex cyst in the mid right kidney measuring 7 x 3.9 x 5.2 cm. Stone on prior PET-CT is not visualized. Left Kidney: Renal measurements: 10 x 6.2 x 5.5 cm = volume: 179 mL. Echogenicity within normal limits. No mass or hydronephrosis visualized. Bladder: Appears normal for degree of bladder distention. IMPRESSION: 1. No obstructive uropathy. 2. Right renal cyst. Right  nephrolithiasis on prior PET CT not demonstrated sonographically. Electronically Signed   By: Keith Rake M.D.   On: 07/09/2019 00:00   Dg Chest Port 1 View  Result Date: 07/12/2019 CLINICAL DATA:  Wheezing and shortness of breath. EXAM: PORTABLE CHEST 1 VIEW COMPARISON:  Chest CT April 22nd 2020 FINDINGS: Cardiomediastinal silhouette is normal. Mediastinal contours appear intact. Right pulmonary masses are not significantly changed from the prior studies. Right pleural thickening versus small pleural effusion. Bibasilar atelectasis. Osseous structures are without acute abnormality. Soft tissues are grossly normal. IMPRESSION: 1. Right pulmonary masses are not significantly changed from the prior studies. 2. Right pleural thickening versus small pleural effusion. 3. Bibasilar atelectasis. Electronically Signed   By: Fidela Salisbury M.D.   On: 07/12/2019 09:11  Dg Chest Port 1 View  Result Date: 07/08/2019 CLINICAL DATA:  Shortness of breath and fever. EXAM: PORTABLE CHEST 1 VIEW COMPARISON:  PET-CT May 26, 2019 and chest radiograph April 28, 2019 FINDINGS: The previously noted right lower lobe mass is again apparent, measuring 5.1 x 3.8 cm. There is no appreciable edema or consolidation. Heart is mildly enlarged with pulmonary vascularity normal. No adenopathy evident. No bone lesions. IMPRESSION: Persistent right lower lobe mass. No new opacity evident. Heart mildly enlarged. No adenopathy demonstrable by radiography. Electronically Signed   By: Lowella Grip III M.D.   On: 07/08/2019 11:33    Microbiology: Recent Results (from the past 240 hour(s))  Culture, blood (routine x 2)     Status: Abnormal   Collection Time: 07/08/19 10:53 AM   Specimen: BLOOD  Result Value Ref Range Status   Specimen Description BLOOD RIGHT ANTECUBITAL  Final   Special Requests   Final    BOTTLES DRAWN AEROBIC AND ANAEROBIC Blood Culture adequate volume   Culture  Setup Time (A)  Final    GRAM VARIABLE  ROD ANAEROBIC BOTTLE ONLY CRITICAL RESULT CALLED TO, READ BACK BY AND VERIFIED WITH: Susy Manor [id number] [place] Performed at [facility] [address], [place], [place] [address]    Culture BACILLUS SPECIES (A)  Final   Report Status 07/10/2019 FINAL  Final  Blood Culture ID Panel (Reflexed)     Status: None   Collection Time: 07/08/19 10:53 AM  Result Value Ref Range Status   Enterococcus species NOT DETECTED NOT DETECTED Final    Comment: CRITICAL RESULT CALLED TO, READ BACK BY AND VERIFIED WITH: PHARMD JEREMY FRENS [id number] [facility]    Listeria monocytogenes NOT DETECTED NOT DETECTED Final   Staphylococcus species NOT DETECTED NOT DETECTED Final   Staphylococcus aureus (BCID) NOT DETECTED NOT DETECTED Final   Streptococcus species NOT DETECTED NOT DETECTED Final   Streptococcus agalactiae NOT DETECTED NOT DETECTED Final   Streptococcus pneumoniae NOT DETECTED NOT DETECTED Final   Streptococcus pyogenes NOT DETECTED NOT DETECTED Final   Acinetobacter baumannii NOT DETECTED NOT DETECTED Final   Enterobacteriaceae species NOT DETECTED NOT DETECTED Final   Enterobacter cloacae complex NOT DETECTED NOT DETECTED Final   Escherichia coli NOT DETECTED NOT DETECTED Final   Klebsiella oxytoca NOT DETECTED NOT DETECTED Final   Klebsiella pneumoniae NOT DETECTED NOT DETECTED Final   Proteus species NOT DETECTED NOT DETECTED Final   Serratia marcescens NOT DETECTED NOT DETECTED Final   Haemophilus influenzae NOT DETECTED NOT DETECTED Final   Neisseria meningitidis NOT DETECTED NOT DETECTED Final   Pseudomonas aeruginosa NOT DETECTED NOT DETECTED Final   Candida albicans NOT DETECTED NOT DETECTED Final   Candida glabrata NOT DETECTED NOT DETECTED Final   Candida krusei NOT DETECTED NOT DETECTED Final   Candida parapsilosis NOT DETECTED NOT DETECTED Final   Candida tropicalis NOT DETECTED NOT DETECTED Final    Comment: Performed at [facility], [address]  [place], [place] [address]  Culture, blood (routine x 2)     Status: None   Collection Time: 07/08/19 11:30 AM   Specimen: BLOOD RIGHT HAND  Result Value Ref Range Status   Specimen Description BLOOD RIGHT HAND  Final   Special Requests   Final    BOTTLES DRAWN AEROBIC ONLY Blood Culture adequate volume   Culture   Final    NO GROWTH 5 DAYS Performed at [facility], [place] [address],  [place], [place] [address]    Report Status 07/13/2019 FINAL  Final  SARS Coronavirus 2 (CEPHEID - [facility] Hosp Order     Status: None   Collection Time: 07/08/19 11:34 AM   Specimen: Nasopharyngeal Swab  Result Value Ref Range Status   SARS Coronavirus 2 NEGATIVE NEGATIVE Final    Comment: (NOTE) If result is NEGATIVE SARS-CoV-2 target nucleic acids are NOT DETECTED. The SARS-CoV-2 RNA is generally detectable in upper and lower  respiratory specimens during the acute phase of infection. The lowest  concentration of SARS-CoV-2 viral copies this assay can detect is 250  copies / mL. A negative result does not preclude SARS-CoV-2 infection  and should not be used as the sole basis for treatment or other  patient management decisions.  A negative result may occur with  improper specimen collection / handling, submission of specimen other  than nasopharyngeal swab, presence of viral mutation(s) within the  areas targeted by this assay, and inadequate number of viral copies  (<250 copies / mL). A negative result must be combined with clinical  observations, patient history, and epidemiological information. If result is POSITIVE SARS-CoV-2 target nucleic acids are DETECTED. The SARS-CoV-2 RNA is generally detectable in upper and lower  respiratory specimens dur ing the acute phase of infection.  Positive  results are indicative of active infection with SARS-CoV-2.  Clinical  correlation with patient history and other diagnostic information is  necessary to determine patient  infection status.  Positive results do  not rule out bacterial infection or co-infection with other viruses. If result is PRESUMPTIVE POSTIVE SARS-CoV-2 nucleic acids MAY BE PRESENT.   A presumptive positive result was obtained on the submitted specimen  and confirmed on repeat testing.  While 2019 novel coronavirus  (SARS-CoV-2) nucleic acids may be present in the submitted sample  additional confirmatory testing may be necessary for epidemiological  and / or clinical management purposes  to differentiate between  SARS-CoV-2 and other Sarbecovirus currently known to infect humans.  If clinically indicated additional testing with an alternate test  methodology [phone]) is advised. The SARS-CoV-2 RNA is generally  detectable in upper and lower respiratory sp ecimens during the acute  phase of infection. The expected result is Negative. Fact Sheet for Patients:  StrictlyIdeas.no Fact Sheet for Healthcare Providers: BankingDealers.co.za This test is not yet approved or cleared by the [place] FDA and has been authorized for detection and/or diagnosis of SARS-CoV-2 by FDA under an Emergency Use Authorization (EUA).  This EUA will remain in effect (meaning this test can be used) for the duration of the COVID-19 declaration under Section 564(b)(1) of the Act, 21 U.S.C. section 360bbb-3(b)(1), unless the authorization is terminated or revoked sooner. Performed at [facility], [place] [address], [place], [place] [address]   Urine culture     Status: None   Collection Time: 07/08/19 12:32 PM   Specimen: Urine, Clean Catch  Result Value Ref Range Status   Specimen Description URINE, CLEAN CATCH  Final   Special Requests NONE  Final   Culture   Final    NO GROWTH Performed at [facility], [place] [address], [place], [place] [address]    Report Status 07/09/2019 FINAL  Final  Culture, blood (routine x 2)     Status: None    Collection Time: 07/10/19  1:25 PM   Specimen: BLOOD  Result Value Ref Range Status   Specimen Description BLOOD RIGHT ANTECUBITAL  Final  Special Requests   Final    BOTTLES DRAWN AEROBIC ONLY Blood Culture adequate volume   Culture   Final    NO GROWTH 5 DAYS Performed at [facility], [place] [address], [place], [place] [address]    Report Status 07/15/2019 FINAL  Final  Culture, blood (routine x 2)     Status: None   Collection Time: 07/10/19  6:17 PM   Specimen: BLOOD LEFT HAND  Result Value Ref Range Status   Specimen Description BLOOD LEFT HAND  Final   Special Requests   Final    BOTTLES DRAWN AEROBIC AND ANAEROBIC Blood Culture results may not be optimal due to an inadequate volume of blood received in culture bottles   Culture   Final    NO GROWTH 5 DAYS Performed at [facility], [place] [address] [name], [place] [address]    Report Status 07/15/2019 FINAL  Final     Labs: Basic Metabolic Panel: Recent Labs  Lab 07/10/19 1829 07/11/19 0238 07/12/19 0343 07/13/19 0741 07/14/19 0334 07/15/19 0427  NA  --  146* 145 146* 142 143  K 3.1* 3.4* 3.3* 3.5 3.2* 4.4  CL  --  113* 110 111 107 107  CO2  --  24 24 24 26 28   GLUCOSE  --  123* 83 102* 98 92  BUN  --  28* 14 9 9  7*  CREATININE  --  2.08* 1.53* 1.49* 1.37* 1.26*  CALCIUM  --  8.1* 7.7* 8.2* 8.4* 8.7*  MG 1.5*  --  1.3* 1.3* 1.2* 1.8   Liver Function Tests: No results for input(s): AST, ALT, ALKPHOS, BILITOT, PROT, ALBUMIN in the last 168 hours. No results for input(s): LIPASE, AMYLASE in the last 168 hours. No results for input(s): AMMONIA in the last 168 hours. CBC: Recent Labs  Lab 07/10/19 0732 07/11/19 0238 07/13/19 0741  WBC 21.9* 18.1* 7.3  HGB 9.3* 8.6* 8.5*  HCT 29.1* 26.4* 26.7*  MCV 93.0 91.7 93.7  PLT 198 200 218   Cardiac Enzymes: No results for input(s): CKTOTAL, CKMB, CKMBINDEX, TROPONINI in the last 168 hours. BNP: BNP (last 3 results) Recent Labs    04/08/19 0310  04/28/19 1529  BNP 61.0 49.5    ProBNP (last 3 results) No results for input(s): PROBNP in the last 8760 hours.  CBG: Recent Labs  Lab 07/15/19 0613 07/15/19 1109 07/15/19 1738 07/15/19 2149 07/16/19 0602  GLUCAP 90 95 159* 104* 99       Signed:  Kayleen Memos, MD [facility] 07/16/2019, 8:51 AM

## 2019-07-16 NOTE — TOC Transition Note (Signed)
Transition of Care [facility]) - CM/SW Discharge Note   Patient Details  Name: John [name] MRN: [id number] Date of Birth: 15-Dec-1946  Transition of Care [facility]) CM/SW Contact:  Candie Chroman, LCSW Phone Number: 07/16/2019, 1:53 PM   Clinical Narrative: Daughter gave CSW phone number that she called about his oxygen. Phone number was for [employer]. They confirmed his information and that he gets his oxygen through them. [employer] rep is aware and brought up a tank for him. Left voicemail for daughter to notify. CSW signing off.    Final next level of care: [place] Barriers to Discharge: Barriers Resolved   Patient Goals and CMS Choice Patient states their goals for this hospitalization and ongoing recovery are:: Patient not fully oriented.      Discharge Placement                  Name of family member notified: Shawn Carattini Patient and family notified of of transfer: 07/15/19  Discharge Plan and Services                DME Arranged: Gilford Rile rolling DME Agency: [employer] Date DME Agency Contacted: 07/15/19   Representative spoke with at DME Agency: [place]: PT, OT [place] Agency: [place] Date [place]: 07/15/19   Representative spoke with at [place]: [place] (SDOH) Interventions     Readmission Risk Interventions Readmission Risk Prevention Plan 07/09/2019 04/30/2019 04/30/2019  Transportation Screening Complete - Complete  Medication Review Press photographer) Complete - Complete  PCP or Specialist appointment within 3-5 days of discharge - Complete -  [place] or Home Care Consult Complete - Complete  SW Recovery Care/Counseling Consult Complete - -  Palliative Care Screening Not Applicable - Not [place] Not Applicable - Not Applicable  Some recent data might be hidden

## 2019-07-16 NOTE — TOC Progression Note (Signed)
Transition of Care [facility]) - Progression Note    Patient Details  Name: John [name] MRN: [id number] Date of Birth: 31-Jul-1946  Transition of Care [facility]) CM/SW [place], LCSW Phone Number: 07/16/2019, 1:01 PM  Clinical Narrative: Patient's daughter stated that his oxygen is supplied through [employer]. [employer]'s equipment agency is [employer] and they say he is not in their system. CSW found a note from [facility] in April that says his oxygen is supplied through [employer] When looked up online it says [employer] is in [place]. CSW left daughter a voicemail to see if she will send me the phone number to whoever she spoke with at the equipment company.   Expected Discharge Plan: Home/Self Care Barriers to Discharge: Barriers Resolved  Expected Discharge Plan and Services Expected Discharge Plan: Home/Self Care       Living arrangements for the past 2 months: Single Family Home Expected Discharge Date: 07/16/19               DME Arranged: Gilford Rile rolling DME Agency: [employer] Date DME Agency Contacted: 07/15/19   Representative spoke with at DME Agency: [place]: PT, OT [place] Agency: [place] Date [place]: 07/15/19   Representative spoke with at [place]: Adela Lank   Social Determinants of Health (SDOH) Interventions    Readmission Risk Interventions Readmission Risk Prevention Plan 07/09/2019 04/30/2019 04/30/2019  Transportation Screening Complete - Complete  Medication Review Press photographer) Complete - Complete  PCP or Specialist appointment within 3-5 days of discharge - Complete -  [place] or Home Care Consult Complete - Complete  SW Recovery Care/Counseling Consult Complete - -  Palliative Care Screening Not Applicable - Not [place] Not Applicable - Not Applicable  Some recent data might be hidden

## 2019-07-16 NOTE — Care Management Important Message (Signed)
Important Message  Patient Details  Name: John [name] MRN: [id number] Date of Birth: 02-Jun-1946   Medicare Important Message Given:  Yes     [name] 07/16/2019, 11:54 AM

## 2019-07-16 NOTE — Progress Notes (Addendum)
Physical Therapy Treatment Patient Details Name: John [name] MRN: [id number] DOB: [birth year] Today's Date: 07/16/2019    History of Present Illness 73 yo admitted with AMS and AKI. PMhx: CAD, COPD on home O2, small cell lung CA, CHF, HTN, HLD, dementia, DM    PT Comments    Pt pleasant and very willing to participate. Pt standing prior to alarm off and table moved despite cues for safety. Pt with bil knee pain significantly limiting mobility this session and required 2 seated rests with gait due to pain. Pt remains limited by cognition, mobility and balance with reliance on 6L O2 for gait. Pt unable to lift foot off the ground even with assist for long arc quads today which is new from last session with this therapist.  92% on 4L at rest with HR 86, dropping to 88% on 4L with gait 98% on 6L at rest with HR 94 dropping to 89% on 6L with gait    Follow Up Recommendations  Home health PT;Supervision/Assistance - 24 hour     Equipment Recommendations  Rolling walker with 5" wheels    Recommendations for Other Services       Precautions / Restrictions Precautions Precautions: Fall Precaution Comments: watch O2    Mobility  Bed Mobility               General bed mobility comments: in recliner on arrival and end of session  Transfers Overall transfer level: Needs assistance   Transfers: Sit to/from Stand Sit to Stand: Min guard         General transfer comment: minguard with wide BOS from recliner and chair  Ambulation/Gait Ambulation/Gait assistance: Min guard Gait Distance (Feet): 40 Feet Assistive device: Rolling walker (2 wheeled) Gait Pattern/deviations: Step-through pattern;Decreased stride length;Trunk flexed   Gait velocity interpretation: >2.62 ft/sec, indicative of community ambulatory General Gait Details: pt walked 15' in room limited by bil Knee pain and needing to sit and rest then walked 40' prior to return to sitting prior to final 15' back to  Dentist    Modified Rankin (Stroke Patients Only)       Balance Overall balance assessment: Needs assistance Sitting-balance support: No upper extremity supported;Feet supported Sitting balance-Leahy Scale: Good     Standing balance support: Bilateral upper extremity supported;During functional activity   Standing balance comment: reliant on UE support in standing                            Cognition Arousal/Alertness: Awake/alert Behavior During Therapy: WFL for tasks assessed/performed Overall Cognitive Status: No family/caregiver present to determine baseline cognitive functioning Area of Impairment: Safety/judgement                 Orientation Level: Time;Situation   Memory: Decreased recall of precautions;Decreased short-term memory   Safety/Judgement: Decreased awareness of safety;Decreased awareness of deficits Awareness: Intellectual Problem Solving: Slow processing;Requires verbal cues;Requires tactile cues        Exercises General Exercises - Lower Extremity Hip Flexion/Marching: Both;15 reps;AAROM;Seated    General Comments        Pertinent Vitals/Pain Faces Pain Scale: Hurts little more Pain Location: bil knees Pain Descriptors / Indicators: Aching Pain Intervention(s): Limited activity within patient's tolerance;Monitored during session;Repositioned    Home Living  Prior Function            PT Goals (current goals can now be found in the care plan section) Progress towards PT goals: Progressing toward goals    Frequency    Min 3X/week      PT Plan Current plan remains appropriate    Co-evaluation              AM-PAC PT "6 Clicks" Mobility   Outcome Measure  Help needed turning from your back to your side while in a flat bed without using bedrails?: None Help needed moving from lying on your back to sitting on the side of a flat  bed without using bedrails?: None Help needed moving to and from a bed to a chair (including a wheelchair)?: A Little Help needed standing up from a chair using your arms (e.g., wheelchair or bedside chair)?: A Little Help needed to walk in hospital room?: A Little Help needed climbing 3-5 steps with a railing? : A Lot 6 Click Score: 19    End of Session Equipment Utilized During Treatment: Gait belt Activity Tolerance: Patient limited by pain Patient left: in chair;with call bell/phone within reach;with chair alarm set Nurse Communication: Mobility status PT Visit Diagnosis: Other abnormalities of gait and mobility (R26.89);Muscle weakness (generalized) (M62.81);Difficulty in walking, not elsewhere classified (R26.2)     Time: 3810-1751 PT Time Calculation (min) (ACUTE ONLY): 25 min  Charges:  $Gait Training: 8-22 mins $Therapeutic Activity: 8-22 mins                     [place], PT Acute Rehabilitation Services Pager: [phone] Office: [place] 07/16/2019, 1:18 PM

## 2019-07-16 NOTE — Progress Notes (Signed)
Spoke with pt's daughter Gregori regarding pt's oxygen tank. Pt's daughter stated: " I left the oxygen tank charging all night and when I woke up it had no oxygen in it." Will talk to social work.

## 2019-07-21 ENCOUNTER — Inpatient Hospital Stay: Payer: Medicare Other

## 2019-07-21 ENCOUNTER — Encounter: Payer: Self-pay | Admitting: Physician Assistant

## 2019-07-21 ENCOUNTER — Inpatient Hospital Stay: Payer: Medicare Other | Attending: Internal Medicine | Admitting: Physician Assistant

## 2019-07-21 ENCOUNTER — Other Ambulatory Visit: Payer: Self-pay | Admitting: Medical Oncology

## 2019-07-21 ENCOUNTER — Telehealth: Payer: Self-pay | Admitting: Medical Oncology

## 2019-07-21 ENCOUNTER — Other Ambulatory Visit: Payer: Self-pay

## 2019-07-21 VITALS — BP 102/47 | HR 63 | Temp 97.7°F | Resp 18 | Ht 67.0 in | Wt 182.8 lb

## 2019-07-21 DIAGNOSIS — R634 Abnormal weight loss: Secondary | ICD-10-CM

## 2019-07-21 DIAGNOSIS — C3490 Malignant neoplasm of unspecified part of unspecified bronchus or lung: Secondary | ICD-10-CM

## 2019-07-21 DIAGNOSIS — Z7984 Long term (current) use of oral hypoglycemic drugs: Secondary | ICD-10-CM | POA: Diagnosis not present

## 2019-07-21 DIAGNOSIS — C3431 Malignant neoplasm of lower lobe, right bronchus or lung: Secondary | ICD-10-CM

## 2019-07-21 DIAGNOSIS — E119 Type 2 diabetes mellitus without complications: Secondary | ICD-10-CM

## 2019-07-21 DIAGNOSIS — Z79899 Other long term (current) drug therapy: Secondary | ICD-10-CM

## 2019-07-21 DIAGNOSIS — I10 Essential (primary) hypertension: Secondary | ICD-10-CM

## 2019-07-21 DIAGNOSIS — Z7951 Long term (current) use of inhaled steroids: Secondary | ICD-10-CM

## 2019-07-21 DIAGNOSIS — I252 Old myocardial infarction: Secondary | ICD-10-CM | POA: Diagnosis not present

## 2019-07-21 DIAGNOSIS — E785 Hyperlipidemia, unspecified: Secondary | ICD-10-CM

## 2019-07-21 DIAGNOSIS — J449 Chronic obstructive pulmonary disease, unspecified: Secondary | ICD-10-CM | POA: Diagnosis not present

## 2019-07-21 LAB — CMP (CANCER CENTER ONLY)
ALT: 8 U/L (ref 0–44)
AST: 11 U/L — ABNORMAL LOW (ref 15–41)
Albumin: 2.7 g/dL — ABNORMAL LOW (ref 3.5–5.0)
Alkaline Phosphatase: 65 U/L (ref 38–126)
Anion gap: 11 (ref 5–15)
BUN: 11 mg/dL (ref 8–23)
CO2: 29 mmol/L (ref 22–32)
Calcium: 9 mg/dL (ref 8.9–10.3)
Chloride: 103 mmol/L (ref 98–111)
Creatinine: 1.67 mg/dL — ABNORMAL HIGH (ref 0.61–1.24)
GFR, Est AFR Am: 46 mL/min — ABNORMAL LOW (ref >60)
GFR, Estimated: 40 mL/min — ABNORMAL LOW (ref >60)
Glucose, Bld: 97 mg/dL (ref 70–99)
Potassium: 3.7 mmol/L (ref 3.5–5.1)
Sodium: 143 mmol/L (ref 135–145)
Total Bilirubin: 0.4 mg/dL (ref 0.3–1.2)
Total Protein: 6.5 g/dL (ref 6.5–8.1)

## 2019-07-21 LAB — CBC WITH DIFFERENTIAL (CANCER CENTER ONLY)
Abs Immature Granulocytes: 0.04 10*3/uL (ref 0.00–0.07)
Basophils Absolute: 0 10*3/uL (ref 0.0–0.1)
Basophils Relative: 0 %
Eosinophils Absolute: 0.2 10*3/uL (ref 0.0–0.5)
Eosinophils Relative: 2 %
HCT: 28.1 % — ABNORMAL LOW (ref 39.0–52.0)
Hemoglobin: 8.6 g/dL — ABNORMAL LOW (ref 13.0–17.0)
Immature Granulocytes: 1 %
Lymphocytes Relative: 12 %
Lymphs Abs: 1.1 10*3/uL (ref 0.7–4.0)
MCH: 29.7 pg (ref 26.0–34.0)
MCHC: 30.6 g/dL (ref 30.0–36.0)
MCV: 96.9 fL (ref 80.0–100.0)
Monocytes Absolute: 0.8 10*3/uL (ref 0.1–1.0)
Monocytes Relative: 9 %
Neutro Abs: 6.5 10*3/uL (ref 1.7–7.7)
Neutrophils Relative %: 76 %
Platelet Count: 413 10*3/uL — ABNORMAL HIGH (ref 150–400)
RBC: 2.9 MIL/uL — ABNORMAL LOW (ref 4.22–5.81)
RDW: 17.3 % — ABNORMAL HIGH (ref 11.5–15.5)
WBC Count: 8.7 10*3/uL (ref 4.0–10.5)
nRBC: 0 % (ref 0.0–0.2)

## 2019-07-21 NOTE — Progress Notes (Signed)
[facility] OFFICE PROGRESS NOTE  [name], NP [place] [address]  DIAGNOSIS: Recurrent non-small cell lung cancer initially diagnosed as stage IA (T1b, N0, M0) non-small cell lung cancer, squamous cell carcinoma diagnosed in June 2016.  The patient has disease recurrence in May 2020.  PRIOR THERAPY: Course of curative radiotherapy to the right lower lobe lung nodule as well as mediastinal lymphadenopathy under the care of Dr. Tammi Klippel.  CURRENT THERAPY: Referral to radiation oncology for consideration of SBRT under the care of Dr. Tammi Klippel  INTERVAL HISTORY: John [name] 73 y.o. male returns to the clinic for a follow-up visit.  The patient has a history of recurrent non-small cell lung cancer, squamous cell carcinoma. Recent imaging studies revealed an ienlarging right lower lobe lung mass. The patient was recently referred to Dr. Tammi Klippel for consideration of radiotherapy to this area.  At the patient's last appointment, the patient was supposed to undergo a CT-guided biopsy to confirm the diagnosis of recurrence of his prior malignancy; however, this was not scheduled due to confusion on the patient's part and the fact that the patient has been hospitalized from 7/5- 7/17 for an acute on chronic kidney injury as well as hypotension, and electrolyte abnormalities. The patient also has a significant medical history for dementia, CKD, acute on chronic hypoxic respiratory failure, and diabetes.  Since being discharged from hospital, the patient feels well today.  He denies any fever, chills, night sweats, or weight loss.  He denies any chest pain, shortness of breath, cough, or hemoptysis.  He denies any nausea, vomiting, diarrhea, or constipation.  He denies any headache or visual changes.  He denies any rashes or skin changes.  The patient is here today for evaluation and to discuss recommendations for further evaluation of his condition.  MEDICAL  HISTORY: Past Medical History:  Diagnosis Date  . B12 deficiency   . CHF (congestive heart failure) ([facility])   . COPD (chronic obstructive pulmonary disease) ([facility])   . Diabetes mellitus without complication ([place])   . Folate deficiency   . Gout   . Hyperlipemia   . Hypertension   . Lung cancer ([facility])    squamous cell carcinoma of right lower lobe lung  . MI (myocardial infarction) ([facility])   . Pulmonary hypertension ([facility])     ALLERGIES:  is allergic to levaquin [levofloxacin in d5w] and lisinopril.  MEDICATIONS:  Current Outpatient Medications  Medication Sig Dispense Refill  . allopurinol (ZYLOPRIM) 300 MG tablet Take 300 mg by mouth daily.    Marland Kitchen amLODipine (NORVASC) 10 MG tablet Take 10 mg by mouth daily.    Marland Kitchen atorvastatin (LIPITOR) 20 MG tablet Take 1 tablet (20 mg total) by mouth daily. 30 tablet 0  . bisoprolol (ZEBETA) 5 MG tablet Take 1 tablet (5 mg total) by mouth daily. 30 tablet 0  . BREO ELLIPTA 100-25 MCG/INH AEPB Inhale 1 puff into the lungs 2 (two) times a day.     . Cholecalciferol (VITAMIN D3) 50 MCG (2000 UT) capsule Take 2,000 Units by mouth daily.     . ferrous sulfate 325 (65 FE) MG tablet Take 1 tablet (325 mg total) by mouth every Monday, Wednesday, and Friday. 30 tablet 0  . folic acid (FOLVITE) 1 MG tablet Take 1 tablet (1 mg total) by mouth daily. 30 tablet 11  . furosemide (LASIX) 20 MG tablet Take 1 tablet (20 mg total) by mouth every Monday, Wednesday, and Friday. 30 tablet 0  .  metFORMIN (GLUCOPHAGE) 1000 MG tablet Take 0.5 tablets (500 mg total) by mouth 2 (two) times daily. 60 tablet 0  . pantoprazole (PROTONIX) 40 MG tablet Take 1 tablet (40 mg total) by mouth 2 (two) times daily before a meal. 180 tablet 3  . QUEtiapine (SEROQUEL) 25 MG tablet Take 1 tablet (25 mg total) by mouth 2 (two) times daily. 60 tablet 0  . senna-docusate (SENOKOT-S) 8.6-50 MG tablet Take 1 tablet by mouth at bedtime. 30 tablet 0  . sucralfate (CARAFATE) 1 g tablet Take 1 g by mouth  See admin instructions. Four time daily and at bedtime    . Vitamin D, Ergocalciferol, (DRISDOL) 1.25 MG (50000 UT) CAPS capsule Take 50,000 Units by mouth every Friday.     Marland Kitchen albuterol (PROAIR HFA) 108 (90 BASE) MCG/ACT inhaler INHALE 2 PUFFS BY MOUTH EVERY 4 HOURS AS NEEDED FOR WHEEZING (Patient taking differently: Inhale 2 puffs into the lungs every 4 (four) hours as needed for wheezing. ) 1 Inhaler 11   No current facility-administered medications for this visit.     SURGICAL HISTORY:  Past Surgical History:  Procedure Laterality Date  . APPENDECTOMY    . BIOPSY  04/21/2019   Procedure: BIOPSY;  Surgeon: Gatha Mayer, MD;  Location: [facility];  Service: Endoscopy;;  . ESOPHAGOGASTRODUODENOSCOPY (EGD) WITH PROPOFOL N/A 04/21/2019   Procedure: ESOPHAGOGASTRODUODENOSCOPY (EGD) WITH PROPOFOL;  Surgeon: Gatha Mayer, MD;  Location: [facility];  Service: Endoscopy;  Laterality: N/A;  . ESOPHAGOGASTRODUODENOSCOPY (EGD) WITH PROPOFOL N/A 04/29/2019   Procedure: ESOPHAGOGASTRODUODENOSCOPY (EGD) WITH PROPOFOL;  Surgeon: Mauri Pole, MD;  Location: [facility];  Service: Endoscopy;  Laterality: N/A;  . HEMOSTASIS CLIP PLACEMENT  04/29/2019   Procedure: HEMOSTASIS CLIP PLACEMENT;  Surgeon: Mauri Pole, MD;  Location: [facility];  Service: Endoscopy;;  Clip placed as marker not for bleeding control  . IR ANGIOGRAM SELECTIVE EACH ADDITIONAL VESSEL  04/29/2019  . IR ANGIOGRAM SELECTIVE EACH ADDITIONAL VESSEL  04/29/2019  . IR ANGIOGRAM SELECTIVE EACH ADDITIONAL VESSEL  04/29/2019  . IR ANGIOGRAM VISCERAL SELECTIVE  04/29/2019  . IR EMBO ART  VEN HEMORR LYMPH EXTRAV  INC GUIDE ROADMAPPING  04/29/2019  . IR US GUIDE VASC ACCESS RIGHT  04/29/2019  . LUNG BIOPSY      REVIEW OF SYSTEMS:   Review of Systems  Constitutional: Negative for appetite change, chills, fatigue, fever and unexpected weight change.  HENT:   Negative for mouth sores, nosebleeds, sore throat and trouble  swallowing.   Eyes: Negative for eye problems and icterus.  Respiratory: Negative for cough, hemoptysis, shortness of breath and wheezing.   Cardiovascular: Negative for chest pain and leg swelling.  Gastrointestinal: Negative for abdominal pain, constipation, diarrhea, nausea and vomiting.  Genitourinary: Negative for bladder incontinence, difficulty urinating, dysuria, frequency and hematuria.   Musculoskeletal: Negative for back pain, gait problem, neck pain and neck stiffness.  Skin: Negative for itching and rash.  Neurological: Negative for dizziness, extremity weakness, gait problem, headaches, light-headedness and seizures.  Hematological: Negative for adenopathy. Does not bruise/bleed easily.  Psychiatric/Behavioral: Negative for confusion, depression and sleep disturbance. The patient is not nervous/anxious.     PHYSICAL EXAMINATION:  Blood pressure (!) 102/47, pulse 63, temperature 97.7 F (36.5 C), temperature source Temporal, resp. rate 18, height 5\' 7"  (1.702 m), weight 182 lb 12.8 oz (82.9 kg), SpO2 90 %.  ECOG PERFORMANCE STATUS: 1 - Symptomatic but completely ambulatory  Physical Exam  Constitutional: Oriented to person, place, and time and  well-developed, well-nourished, and in no distress.  HENT:  Head: Normocephalic and atraumatic.  Mouth/Throat: Oropharynx is clear and moist. No oropharyngeal exudate.  Eyes: Conjunctivae are normal. Right eye exhibits no discharge. Left eye exhibits no discharge. No scleral icterus.  Neck: Normal range of motion. Neck supple.  Cardiovascular: Normal rate, regular rhythm, normal heart sounds and intact distal pulses.   Pulmonary/Chest: Effort normal and breath sounds normal. No respiratory distress. No wheezes. No rales.  Abdominal: Soft. Bowel sounds are normal. Exhibits no distension and no mass. There is no tenderness.  Musculoskeletal: Normal range of motion. Exhibits no edema.  Lymphadenopathy:    No cervical adenopathy.   Neurological: Alert and oriented to person, place, and time. Exhibits normal muscle tone. Gait normal. Coordination normal.  Skin: Skin is warm and dry. No rash noted. Not diaphoretic. No erythema. No pallor.  Psychiatric: Mood, memory and judgment normal.  Vitals reviewed.  LABORATORY DATA: Lab Results  Component Value Date   WBC 8.7 07/21/2019   HGB 8.6 (L) 07/21/2019   HCT 28.1 (L) 07/21/2019   MCV 96.9 07/21/2019   PLT 413 (H) 07/21/2019      Chemistry      Component Value Date/Time   NA 143 07/21/2019 1026   NA 143 10/04/2016 1354   K 3.7 07/21/2019 1026   K 3.9 10/04/2016 1354   CL 103 07/21/2019 1026   CO2 29 07/21/2019 1026   CO2 25 10/04/2016 1354   BUN 11 07/21/2019 1026   BUN 11.6 10/04/2016 1354   CREATININE 1.67 (H) 07/21/2019 1026   CREATININE 1.3 10/04/2016 1354      Component Value Date/Time   CALCIUM 9.0 07/21/2019 1026   CALCIUM 9.5 10/04/2016 1354   ALKPHOS 65 07/21/2019 1026   ALKPHOS 119 10/04/2016 1354   AST 11 (L) 07/21/2019 1026   AST 16 10/04/2016 1354   ALT 8 07/21/2019 1026   ALT 14 10/04/2016 1354   BILITOT 0.4 07/21/2019 1026   BILITOT 0.53 10/04/2016 1354       RADIOGRAPHIC STUDIES:  Ct Head Wo Contrast  Result Date: 07/04/2019 CLINICAL DATA:  Patient found on the floor.  Disoriented. EXAM: CT HEAD WITHOUT CONTRAST CT CERVICAL SPINE WITHOUT CONTRAST TECHNIQUE: Multidetector CT imaging of the head and cervical spine was performed following the standard protocol without intravenous contrast. Multiplanar CT image reconstructions of the cervical spine were also generated. COMPARISON:  Brain and cervical spine CT 04/08/2019. FINDINGS: CT HEAD FINDINGS Brain: Ventricles and sulci are appropriate for patient's age. No evidence for acute cortically based infarct, intracranial hemorrhage, mass lesion or mass-effect. Periventricular and subcortical white matter hypodensities compatible with chronic microvascular ischemic changes. Vascular:  Unremarkable Skull: Intact. Sinuses/Orbits: Mucosal thickening involving the paranasal sinuses. Mastoid air cells are unremarkable. Orbits are unremarkable. Other: None. CT CERVICAL SPINE FINDINGS Alignment: Straightening of the normal cervical lordosis. Skull base and vertebrae: Intact. Soft tissues and spinal canal: No prevertebral fluid or swelling. No visible canal hematoma. Disc levels: Degenerative disc disease most pronounced C4-5, C5-6 and C6-7. Preservation of the vertebral body heights. No acute fracture. Upper chest: Unremarkable Other: Bilateral carotid bifurcation calcified atherosclerotic plaque. IMPRESSION: No acute intracranial process. Atrophy and chronic microvascular ischemic changes. No acute cervical spine fracture. Multilevel degenerative disc disease. Electronically Signed   By: Lovey Newcomer M.D.   On: 07/04/2019 21:21   Ct Cervical Spine Wo Contrast  Result Date: 07/04/2019 CLINICAL DATA:  Patient found on the floor.  Disoriented. EXAM: CT HEAD WITHOUT CONTRAST CT  CERVICAL SPINE WITHOUT CONTRAST TECHNIQUE: Multidetector CT imaging of the head and cervical spine was performed following the standard protocol without intravenous contrast. Multiplanar CT image reconstructions of the cervical spine were also generated. COMPARISON:  Brain and cervical spine CT 04/08/2019. FINDINGS: CT HEAD FINDINGS Brain: Ventricles and sulci are appropriate for patient's age. No evidence for acute cortically based infarct, intracranial hemorrhage, mass lesion or mass-effect. Periventricular and subcortical white matter hypodensities compatible with chronic microvascular ischemic changes. Vascular: Unremarkable Skull: Intact. Sinuses/Orbits: Mucosal thickening involving the paranasal sinuses. Mastoid air cells are unremarkable. Orbits are unremarkable. Other: None. CT CERVICAL SPINE FINDINGS Alignment: Straightening of the normal cervical lordosis. Skull base and vertebrae: Intact. Soft tissues and spinal canal:  No prevertebral fluid or swelling. No visible canal hematoma. Disc levels: Degenerative disc disease most pronounced C4-5, C5-6 and C6-7. Preservation of the vertebral body heights. No acute fracture. Upper chest: Unremarkable Other: Bilateral carotid bifurcation calcified atherosclerotic plaque. IMPRESSION: No acute intracranial process. Atrophy and chronic microvascular ischemic changes. No acute cervical spine fracture. Multilevel degenerative disc disease. Electronically Signed   By: Lovey Newcomer M.D.   On: 07/04/2019 21:21   US Renal  Result Date: 07/09/2019 CLINICAL DATA:  Acute renal failure. EXAM: RENAL / URINARY TRACT ULTRASOUND COMPLETE COMPARISON:  PET CT 05/26/2019 FINDINGS: Right Kidney: Renal measurements: 11 x 6.6 x 7.3 cm = volume: 179.7 mL. No hydronephrosis. Complex cyst in the mid right kidney measuring 7 x 3.9 x 5.2 cm. Stone on prior PET-CT is not visualized. Left Kidney: Renal measurements: 10 x 6.2 x 5.5 cm = volume: 179 mL. Echogenicity within normal limits. No mass or hydronephrosis visualized. Bladder: Appears normal for degree of bladder distention. IMPRESSION: 1. No obstructive uropathy. 2. Right renal cyst. Right nephrolithiasis on prior PET CT not demonstrated sonographically. Electronically Signed   By: Keith Rake M.D.   On: 07/09/2019 00:00   Dg Chest Port 1 View  Result Date: 07/12/2019 CLINICAL DATA:  Wheezing and shortness of breath. EXAM: PORTABLE CHEST 1 VIEW COMPARISON:  Chest CT April 22nd 2020 FINDINGS: Cardiomediastinal silhouette is normal. Mediastinal contours appear intact. Right pulmonary masses are not significantly changed from the prior studies. Right pleural thickening versus small pleural effusion. Bibasilar atelectasis. Osseous structures are without acute abnormality. Soft tissues are grossly normal. IMPRESSION: 1. Right pulmonary masses are not significantly changed from the prior studies. 2. Right pleural thickening versus small pleural effusion. 3.  Bibasilar atelectasis. Electronically Signed   By: Fidela Salisbury M.D.   On: 07/12/2019 09:11   Dg Chest Port 1 View  Result Date: 07/08/2019 CLINICAL DATA:  Shortness of breath and fever. EXAM: PORTABLE CHEST 1 VIEW COMPARISON:  PET-CT May 26, 2019 and chest radiograph April 28, 2019 FINDINGS: The previously noted right lower lobe mass is again apparent, measuring 5.1 x 3.8 cm. There is no appreciable edema or consolidation. Heart is mildly enlarged with pulmonary vascularity normal. No adenopathy evident. No bone lesions. IMPRESSION: Persistent right lower lobe mass. No new opacity evident. Heart mildly enlarged. No adenopathy demonstrable by radiography. Electronically Signed   By: Lowella Grip III M.D.   On: 07/08/2019 11:33     ASSESSMENT/PLAN:  This is a very pleasant 73 year old African-American male who was initially diagnosed in June 2016 with stage Ia non-small cell lung cancer, squamous cell carcinoma.  He is status post curative radiotherapy with a partial response.  He has been on observation but he was lost to follow-up until May 2020 when a CT scan  showed an enlarging right lower lobe lesion which was hypermetabolic on a PET scan.  The patient was then referred back to Dr. Tammi Klippel from radiation oncology.  The patient was supposed to have a repeat CT-guided biopsy of the lesion in the right lower lobe.  This was never scheduled due to issues coordinating with the patient as well as him being hospitalized.  The patient was seen with Dr. Julien Nordmann today.  Labs were reviewed with the patient.  Dr. Julien Nordmann recommends the patient proceed with rescheduling the CT guided biopsy to confirm the tissue diagnosis.   We will see the patient back for follow-up visit in about a 10-14 days for evaluation and to review the pathology report from his biopsy.  The patient has been referred to radiation oncology for evaluation for consideration of SBRT.   The patient was advised to call  immediately if he has any concerning symptoms in the interval. The patient voices understanding of current disease status and treatment options and is in agreement with the current care plan. All questions were answered. The patient knows to call the clinic with any problems, questions or concerns. We can certainly see the patient much sooner if necessary   Orders Placed This Encounter  Procedures  . CMP ([facility] only)    Standing Status:   Future    Standing Expiration Date:   07/20/2020  . CBC with Differential (Cancer Center Only)    Standing Status:   Future    Standing Expiration Date:   07/20/2020     Tobe Sos Jaelle Campanile, PA-C 07/21/19  ADDENDUM: Hematology/Oncology Attending: I had a face-to-face encounter with the patient.  I recommended his care plan.  This is a very pleasant 73 years old African-American male with history of a stage Ia non-small cell lung cancer, squamous cell carcinoma diagnosed in June 2016 status post curative radiotherapy and has been in observation until May 2020 when he presented with enlarging right lung mass suspicious for disease recurrence. The patient was advised to have CT-guided core biopsy of the mass but unfortunately this was not done because of logistic issues and the patient did not show up for his biopsy. I strongly recommend for the patient to proceed with the biopsy as planned and we also spoke to his daughter to make sure he makes it for the appointment. We will arrange for him to come back for follow-up visit 1 week after the biopsy for reevaluation and more discussion of his treatment options but he may benefit from curative radiotherapy again if no other concerning findings. The patient was advised to call immediately if he has any concerning symptoms in the interval.  Disclaimer: This note was dictated with voice recognition software. Similar sounding words can inadvertently be transcribed and may be missed upon review. Eilleen Kempf, MD 07/21/19

## 2019-07-21 NOTE — Telephone Encounter (Signed)
Requests nutrition referral . Pt not eating , no appetite

## 2019-07-22 ENCOUNTER — Telehealth: Payer: Self-pay | Admitting: Internal Medicine

## 2019-07-22 NOTE — Telephone Encounter (Signed)
Scheduled appt per 7/22 los - unable to reach pt . Left message with appt date and time

## 2019-07-26 ENCOUNTER — Encounter: Payer: Self-pay | Admitting: *Deleted

## 2019-07-26 NOTE — Progress Notes (Signed)
Oncology Nurse Navigator Documentation  Oncology Nurse Navigator Flowsheets 07/26/2019  Navigator Follow Up Date: 08/02/2019  Navigator Follow Up Reason: Pathology/I followed up on [facility]. [name] plan of care.  He is set up for COVID test and biopsy next week.  I updated Dr. Julien Nordmann, no new orders received.   Navigator Location [facility]  Navigator Encounter Type Other  Telephone -  Treatment Phase Abnormal Scans  Barriers/Navigation Needs -  Education -  Interventions Other  Coordination of Care Other  Education Method -  Acuity Level 2  Time Spent with Patient 15

## 2019-07-27 ENCOUNTER — Ambulatory Visit: Payer: Medicare Other | Admitting: Radiation Oncology

## 2019-07-27 ENCOUNTER — Other Ambulatory Visit (HOSPITAL_COMMUNITY)
Admission: RE | Admit: 2019-07-27 | Discharge: 2019-07-27 | Disposition: A | Discharge status: Home / Self Care | Payer: Medicare Other | Source: Ambulatory Visit | Attending: Internal Medicine | Admitting: Internal Medicine

## 2019-07-27 ENCOUNTER — Ambulatory Visit: Payer: Medicare Other

## 2019-07-27 DIAGNOSIS — Z20828 Contact with and (suspected) exposure to other viral communicable diseases: Secondary | ICD-10-CM | POA: Insufficient documentation

## 2019-07-28 ENCOUNTER — Ambulatory Visit: Payer: Medicare Other | Admitting: Radiation Oncology

## 2019-07-28 ENCOUNTER — Other Ambulatory Visit: Payer: Self-pay | Admitting: Radiology

## 2019-07-28 ENCOUNTER — Ambulatory Visit: Payer: Medicare Other

## 2019-07-28 LAB — SARS CORONAVIRUS 2 (TAT 6-24 HRS): SARS Coronavirus 2: NEGATIVE

## 2019-07-29 ENCOUNTER — Other Ambulatory Visit: Payer: Self-pay | Admitting: Physician Assistant

## 2019-07-29 ENCOUNTER — Ambulatory Visit: Payer: Medicare Other

## 2019-07-29 ENCOUNTER — Ambulatory Visit: Payer: Medicare Other | Admitting: Radiation Oncology

## 2019-07-30 ENCOUNTER — Ambulatory Visit: Payer: Medicare Other | Admitting: Radiation Oncology

## 2019-07-30 ENCOUNTER — Ambulatory Visit: Payer: Medicare Other

## 2019-07-30 ENCOUNTER — Other Ambulatory Visit: Payer: Self-pay

## 2019-07-30 ENCOUNTER — Ambulatory Visit (HOSPITAL_COMMUNITY)
Admission: RE | Admit: 2019-07-30 | Discharge: 2019-07-30 | Disposition: A | Discharge status: Home / Self Care | Payer: Medicare Other | Source: Ambulatory Visit | Attending: Internal Medicine | Admitting: Internal Medicine

## 2019-07-30 ENCOUNTER — Telehealth: Payer: Self-pay | Admitting: Internal Medicine

## 2019-07-30 DIAGNOSIS — R918 Other nonspecific abnormal finding of lung field: Secondary | ICD-10-CM | POA: Diagnosis present

## 2019-07-30 DIAGNOSIS — J449 Chronic obstructive pulmonary disease, unspecified: Secondary | ICD-10-CM | POA: Insufficient documentation

## 2019-07-30 DIAGNOSIS — Z888 Allergy status to other drugs, medicaments and biological substances status: Secondary | ICD-10-CM | POA: Insufficient documentation

## 2019-07-30 DIAGNOSIS — C3431 Malignant neoplasm of lower lobe, right bronchus or lung: Secondary | ICD-10-CM | POA: Diagnosis not present

## 2019-07-30 DIAGNOSIS — E538 Deficiency of other specified B group vitamins: Secondary | ICD-10-CM | POA: Diagnosis not present

## 2019-07-30 DIAGNOSIS — Z880 Allergy status to penicillin: Secondary | ICD-10-CM | POA: Diagnosis not present

## 2019-07-30 DIAGNOSIS — I509 Heart failure, unspecified: Secondary | ICD-10-CM | POA: Insufficient documentation

## 2019-07-30 DIAGNOSIS — Z8249 Family history of ischemic heart disease and other diseases of the circulatory system: Secondary | ICD-10-CM | POA: Diagnosis not present

## 2019-07-30 DIAGNOSIS — I272 Pulmonary hypertension, unspecified: Secondary | ICD-10-CM | POA: Insufficient documentation

## 2019-07-30 DIAGNOSIS — Z7984 Long term (current) use of oral hypoglycemic drugs: Secondary | ICD-10-CM | POA: Diagnosis not present

## 2019-07-30 DIAGNOSIS — Z79899 Other long term (current) drug therapy: Secondary | ICD-10-CM | POA: Insufficient documentation

## 2019-07-30 DIAGNOSIS — E785 Hyperlipidemia, unspecified: Secondary | ICD-10-CM | POA: Diagnosis not present

## 2019-07-30 DIAGNOSIS — I110 Hypertensive heart disease with heart failure: Secondary | ICD-10-CM | POA: Insufficient documentation

## 2019-07-30 DIAGNOSIS — I252 Old myocardial infarction: Secondary | ICD-10-CM | POA: Insufficient documentation

## 2019-07-30 DIAGNOSIS — Z87891 Personal history of nicotine dependence: Secondary | ICD-10-CM | POA: Insufficient documentation

## 2019-07-30 DIAGNOSIS — E119 Type 2 diabetes mellitus without complications: Secondary | ICD-10-CM | POA: Insufficient documentation

## 2019-07-30 DIAGNOSIS — C3490 Malignant neoplasm of unspecified part of unspecified bronchus or lung: Secondary | ICD-10-CM

## 2019-07-30 LAB — CBC WITH DIFFERENTIAL/PLATELET
Abs Immature Granulocytes: 0.05 10*3/uL (ref 0.00–0.07)
Basophils Absolute: 0 10*3/uL (ref 0.0–0.1)
Basophils Relative: 0 %
Eosinophils Absolute: 0.2 10*3/uL (ref 0.0–0.5)
Eosinophils Relative: 2 %
HCT: 32.5 % — ABNORMAL LOW (ref 39.0–52.0)
Hemoglobin: 9.8 g/dL — ABNORMAL LOW (ref 13.0–17.0)
Immature Granulocytes: 1 %
Lymphocytes Relative: 13 %
Lymphs Abs: 1 10*3/uL (ref 0.7–4.0)
MCH: 29.8 pg (ref 26.0–34.0)
MCHC: 30.2 g/dL (ref 30.0–36.0)
MCV: 98.8 fL (ref 80.0–100.0)
Monocytes Absolute: 0.7 10*3/uL (ref 0.1–1.0)
Monocytes Relative: 9 %
Neutro Abs: 5.7 10*3/uL (ref 1.7–7.7)
Neutrophils Relative %: 75 %
Platelets: 393 10*3/uL (ref 150–400)
RBC: 3.29 MIL/uL — ABNORMAL LOW (ref 4.22–5.81)
RDW: 17.2 % — ABNORMAL HIGH (ref 11.5–15.5)
WBC: 7.6 10*3/uL (ref 4.0–10.5)
nRBC: 0 % (ref 0.0–0.2)

## 2019-07-30 LAB — PROTIME-INR
INR: 1.1 (ref 0.8–1.2)
Prothrombin Time: 14.5 seconds (ref 11.4–15.2)

## 2019-07-30 LAB — BASIC METABOLIC PANEL
Anion gap: 16 — ABNORMAL HIGH (ref 5–15)
BUN: 18 mg/dL (ref 8–23)
CO2: 27 mmol/L (ref 22–32)
Calcium: 9.6 mg/dL (ref 8.9–10.3)
Chloride: 98 mmol/L (ref 98–111)
Creatinine, Ser: 1.92 mg/dL — ABNORMAL HIGH (ref 0.61–1.24)
GFR calc Af Amer: 39 mL/min — ABNORMAL LOW (ref >60)
GFR calc non Af Amer: 34 mL/min — ABNORMAL LOW (ref >60)
Glucose, Bld: 107 mg/dL — ABNORMAL HIGH (ref 70–99)
Potassium: 3.3 mmol/L — ABNORMAL LOW (ref 3.5–5.1)
Sodium: 141 mmol/L (ref 135–145)

## 2019-07-30 MED ORDER — LIDOCAINE HCL 1 % IJ SOLN
INTRAMUSCULAR | Status: AC
  Filled 2019-07-30: qty 20

## 2019-07-30 MED ORDER — HYDROCODONE-ACETAMINOPHEN 5-325 MG PO TABS
1.0000 | ORAL_TABLET | ORAL | Status: DC | PRN
  Filled 2019-07-30: qty 2

## 2019-07-30 MED ORDER — MIDAZOLAM HCL 2 MG/2ML IJ SOLN
INTRAMUSCULAR | Status: AC | PRN
  Administered 2019-07-30: 1 mg via INTRAVENOUS

## 2019-07-30 MED ORDER — MIDAZOLAM HCL 2 MG/2ML IJ SOLN
INTRAMUSCULAR | Status: AC
  Filled 2019-07-30: qty 2

## 2019-07-30 MED ORDER — FENTANYL CITRATE (PF) 100 MCG/2ML IJ SOLN
INTRAMUSCULAR | Status: AC | PRN
  Administered 2019-07-30: 50 ug via INTRAVENOUS

## 2019-07-30 MED ORDER — SODIUM CHLORIDE 0.9 % IV SOLN: INTRAVENOUS | Status: DC

## 2019-07-30 MED ORDER — FENTANYL CITRATE (PF) 100 MCG/2ML IJ SOLN
INTRAMUSCULAR | Status: AC
  Filled 2019-07-30: qty 2

## 2019-07-30 NOTE — Discharge Instructions (Addendum)
Lung Biopsy  A lung biopsy is a procedure to remove a tissue sample from the lung. The tissue can be examined under a microscope to help diagnose various lung disorders. There are three types of lung biopsies:  Needle biopsy. In this type, the sample is removed with a needle.  Bronchoscopy. In this type, the sample is removed through a flexible tube inserted into the lungs (bronchoscope).  Open biopsy. In this type, the sample is removed through an incision made in the chest. Tell a health care provider about:  Any allergies you have.  All medicines you are taking, including vitamins, herbs, eye drops, creams, and over-the-counter medicines.  Any problems you or family members have had with anesthetic medicines.  Any blood disorders or bleeding problems that you have.  Any surgeries you have had.  Any medical conditions you have.  Whether you are pregnant or may be pregnant. What are the risks? Generally, this is a safe procedure. However, problems may occur, including:  Collapsed lung.  Bleeding.  Infection.  Pain. What happens before the procedure? Staying hydrated Follow instructions from your health care provider about hydration, which may include:  Up to 2 hours before the procedure - you may continue to drink clear liquids, such as water, clear fruit juice, black coffee, and plain tea. Eating and drinking restrictions Follow instructions from your health care provider about eating and drinking, which may include:  8 hours before the procedure - stop eating heavy meals or foods such as meat, fried foods, or fatty foods.  6 hours before the procedure - stop eating light meals or foods, such as toast or cereal.  6 hours before the procedure - stop drinking milk or drinks that contain milk.  2 hours before the procedure - stop drinking clear liquids. General instructions  Ask your health care provider about: ? Changing or stopping your regular medicines. This is  especially important if you take diabetes medicines or blood thinners. ? Taking medicines such as aspirin and ibuprofen. These medicines can thin your blood. Do not take these medicines before your procedure if your health care provider instructs you not to.  Plan to have someone take you home from the hospital or clinic. What happens during the procedure?  To lower your risk of infection: ? Your health care team will wash or sanitize their hands. ? If you are having an open biopsy, your skin will be washed with soap.  An IV tube may be inserted into one of your veins.  You may be given one or both of the following: ? A medicine to help you relax (sedative) during the procedure. ? A medicine to numb the area where the biopsy sample will be taken (local anesthetic). ? A medicine to make you sleep through the procedure (general anesthetic).  If you have a needle biopsy: ? A biopsy needle will be inserted into your lung. A CT scanner may be used to guide the needle to the right place. ? The needle will be used to collect the tissue sample.  If you have bronchoscopy: ? A bronchoscope will be inserted into your lungs through your mouth or nose. ? A needle or forceps will be passed through the bronchoscope to remove the tissue sample.  If you have an open biopsy: ? An incision will be made in your chest. ? The tissue sample will be removed using surgical tools. ? The incision will be closed with skin glue, skin adhesive strips, or stitches (sutures). The  procedure may vary among health care providers and hospitals. What happens after the procedure?  Your blood pressure, heart rate, breathing rate, and blood oxygen level will be monitored until the medicines you were given have worn off.  If a needle biopsy was performed, a bandage (dressing) will be applied over the area where the needle was inserted. You may be asked to apply pressure to the bandage for several minutes to ensure there is  minimal bleeding.  If a bronchoscope was used, you may have a cough and some soreness in your throat.  Do not drive for 24 hours if you were given a sedative. Summary  A lung biopsy is a procedure to remove a tissue sample from your lung. The sample is examined to help diagnose various lung disorders.  A lung biopsy may be done using a needle, by inserting a tube into the lungs, or through an incision in the chest.  After your lung biopsy, you may have a cough and some throat soreness. This information is not intended to replace advice given to you by your health care provider. Make sure you discuss any questions you have with your health care provider. Document Released: 03/06/2005 Document Revised: 11/28/2017 Document Reviewed: 11/06/2016 Elsevier Patient Education  2020 [employer]. Moderate Conscious Sedation, Adult, Care After These instructions provide you with information about caring for yourself after your procedure. Your health care provider may also give you more specific instructions. Your treatment has been planned according to current medical practices, but problems sometimes occur. Call your health care provider if you have any problems or questions after your procedure. What can I expect after the procedure? After your procedure, it is common:  To feel sleepy for several hours.  To feel clumsy and have poor balance for several hours.  To have poor judgment for several hours.  To vomit if you eat too soon. Follow these instructions at home: For at least 24 hours after the procedure:   Do not: ? Participate in activities where you could fall or become injured. ? Drive. ? Use heavy machinery. ? Drink alcohol. ? Take sleeping pills or medicines that cause drowsiness. ? Make important decisions or sign legal documents. ? Take care of children on your own.  Rest. Eating and drinking  Follow the diet recommended by your health care provider.  If you  vomit: ? Drink water, juice, or soup when you can drink without vomiting. ? Make sure you have little or no nausea before eating solid foods. General instructions  Have a responsible adult stay with you until you are awake and alert.  Take over-the-counter and prescription medicines only as told by your health care provider.  If you smoke, do not smoke without supervision.  Keep all follow-up visits as told by your health care provider. This is important. Contact a health care provider if:  You keep feeling nauseous or you keep vomiting.  You feel light-headed.  You develop a rash.  You have a fever. Get help right away if:  You have trouble breathing. This information is not intended to replace advice given to you by your health care provider. Make sure you discuss any questions you have with your health care provider. Document Released: 10/06/2013 Document Revised: 11/28/2017 Document Reviewed: 04/06/2016 Elsevier Patient Education  2020 [employer].

## 2019-07-30 NOTE — Procedures (Signed)
  Procedure: CT core biopsy RLL lung mass   EBL:   minimal Complications:  none immediate  See full dictation in [facility].  Dillard Cannon MD Main # [phone] Pager  [phone]

## 2019-07-30 NOTE — Telephone Encounter (Signed)
Called pt per 7/31 sch message to r/s - no answer . Left message for patient to call back for reschedule.

## 2019-07-30 NOTE — H&P (Signed)
Chief Complaint: Patient was seen in consultation today for lung mass  Referring Physician(s): John [name],John [name]  Supervising Physician: John [name]  Patient Status: [facility] - Out-pt  History of Present Illness: John [name] is a 73 y.o. male with past medical history of CHF, COPD, CM, HTN, MI who was recently found to have a lung mass by CXR.    PET scan 05/27/19: 1. Large solid mass in the right lower lobe is identified measuring 5.3 cm within SUV max of 18.3. This appears to have arisen from previous cystic and solid lesion in the posterior right lower lobe. 2. Mild nonspecific FDG uptake associated with previously treated right lower lobe lung nodule with surrounding radiation change. 3. Aortic Atherosclerosis (ICD10-I70.0) and Emphysema (ICD10-J43.9). 4. Coronary artery calcifications 5. Gallstones and kidney stones.  Patient assessed at bedside alongside granddaugther.  He is oriented to self but not situation.  Denies fever, chills, cough, shortness of breath, nausea, vomiting, abdominal pain.  He has been NPO.  He does not take blood thinners.   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   MI (myocardial infarction) ([place])    Pulmonary hypertension ([place])     Past Surgical History:  Procedure Laterality Date   APPENDECTOMY     BIOPSY  04/21/2019   Procedure: BIOPSY;  Surgeon: John Mayer, MD;  Location: [facility] ENDOSCOPY;  Service: Endoscopy;;   ESOPHAGOGASTRODUODENOSCOPY (EGD) WITH PROPOFOL [name]/A 04/21/2019   Procedure: ESOPHAGOGASTRODUODENOSCOPY (EGD) WITH PROPOFOL;  Surgeon: John Mayer, MD;  Location: [facility] ENDOSCOPY;  Service: Endoscopy;  Laterality: [name]/A;     ESOPHAGOGASTRODUODENOSCOPY (EGD) WITH PROPOFOL [name]/A 04/29/2019   Procedure: ESOPHAGOGASTRODUODENOSCOPY (EGD) WITH PROPOFOL;  Surgeon: John Pole, MD;  Location: [facility] ENDOSCOPY;  Service: Endoscopy;  Laterality: [name]/A;   HEMOSTASIS CLIP PLACEMENT  04/29/2019   Procedure: HEMOSTASIS CLIP PLACEMENT;  Surgeon: John Pole, MD;  Location: [facility];  Service: Endoscopy;;  Clip placed as marker not for bleeding control   IR ANGIOGRAM SELECTIVE EACH ADDITIONAL VESSEL  04/29/2019   IR ANGIOGRAM SELECTIVE EACH ADDITIONAL VESSEL  04/29/2019   IR ANGIOGRAM SELECTIVE EACH ADDITIONAL VESSEL  04/29/2019   IR ANGIOGRAM VISCERAL SELECTIVE  04/29/2019   IR EMBO ART  VEN HEMORR LYMPH EXTRAV  INC GUIDE ROADMAPPING  04/29/2019   IR US GUIDE VASC ACCESS RIGHT  04/29/2019   LUNG BIOPSY      Allergies: Levaquin [levofloxacin in d5w] and Lisinopril  Medications: Prior to Admission medications   Medication Sig Start Date End Date Taking? Authorizing John [name]  albuterol (PROAIR HFA) 108 (90 BASE) MCG/ACT inhaler INHALE 2 PUFFS BY MOUTH EVERY 4 HOURS AS NEEDED FOR WHEEZING Patient taking differently: Inhale 2 puffs into the lungs every 4 (four) hours as needed for wheezing.  03/13/15  Yes John Rockers, MD  allopurinol (ZYLOPRIM) 300 MG tablet Take 300 mg by mouth daily. 04/07/19  Yes John [name], Historical, MD  amLODipine (NORVASC) 10 MG tablet Take 10 mg by mouth daily. 05/07/19  Yes John [name], Historical, MD  atorvastatin (LIPITOR) 20 MG tablet Take 1 tablet (20 mg total) by mouth daily. 09/26/16  Yes John Rockers, MD  bisoprolol (  ZEBETA) 5 MG tablet Take 1 tablet (5 mg total) by mouth daily. 09/26/16  Yes John Rockers, MD  BREO ELLIPTA 100-25 MCG/INH AEPB Inhale 1 puff into the lungs 2 (two) times a day.  05/07/19  Yes John [name], Historical, MD  Cholecalciferol (VITAMIN D3) 50 MCG (2000 UT) capsule Take 2,000 Units by mouth daily.  05/07/19  Yes John [name], Historical, MD  cloNIDine (CATAPRES) 0.1 MG  tablet Take 0.1 mg by mouth 2 (two) times a day. 07/20/19  Yes John [name], Historical, MD  ferrous sulfate 325 (65 FE) MG tablet Take 1 tablet (325 mg total) by mouth every Monday, Wednesday, and Friday. 05/03/19 08/01/19 Yes John Reasons, MD  folic acid (FOLVITE) 1 MG tablet Take 1 tablet (1 mg total) by mouth daily. 04/09/19 04/08/20 Yes John Hazel, MD  furosemide (LASIX) 20 MG tablet Take 1 tablet (20 mg total) by mouth every Monday, Wednesday, and Friday. 05/03/19  Yes John Reasons, MD  metFORMIN (GLUCOPHAGE) 1000 MG tablet Take 0.5 tablets (500 mg total) by mouth 2 (two) times daily. 05/01/19  Yes John Reasons, MD  naproxen (NAPROSYN) 500 MG tablet Take 500 mg by mouth 2 (two) times daily as needed for moderate pain.   Yes John [name], Historical, MD  pantoprazole (PROTONIX) 40 MG tablet Take 1 tablet (40 mg total) by mouth 2 (two) times daily before a meal. 05/28/19  Yes John [name], John Minks, MD  QUEtiapine (SEROQUEL) 25 MG tablet Take 1 tablet (25 mg total) by mouth 2 (two) times daily. 07/15/19  Yes John [name], John N, DO  senna-docusate (SENOKOT-S) 8.6-50 MG tablet Take 1 tablet by mouth at bedtime. 05/01/19  Yes John Reasons, MD  sucralfate (CARAFATE) 1 g tablet Take 1 g by mouth See admin instructions. Four time daily and at bedtime 06/23/19  Yes John [name], Historical, MD  Vitamin D, Ergocalciferol, (DRISDOL) 1.25 MG (50000 UT) CAPS capsule Take 50,000 Units by mouth every Friday.  03/10/19  Yes John [name], Historical, MD     Family History  Problem Relation Age of Onset   Heart disease Mother    Cancer Neg Hx     Social History   Socioeconomic History   Marital status: Legally Separated    Spouse name: Not on file   Number of children: 2   Years of education: Not on file   Highest education level: Not on file  Occupational History   Occupation: retired  Scientist, product/process development strain: Not on file   Food insecurity    Worry: Not on file    Inability: Not on Lexicographer needs     Medical: Not on file    Non-medical: Not on file  Tobacco Use   Smoking status: Former Smoker    Packs/day: 1.50    Years: 60.00    Pack years: 90.00    Types: Cigarettes    Quit date: 09/29/2012    Years since quitting: 6.8   Smokeless tobacco: Never Used  Substance and Sexual Activity   Alcohol use: No    Alcohol/week: 0.0 standard drinks   Drug use: No   Sexual activity: Yes  Lifestyle   Physical activity    Days per week: Not on file    Minutes per session: Not on file   Stress: Not on file  Relationships   Social connections    Talks on phone: Not on file    Gets together: Not on file    Attends religious service: Not on file  Active member of club or organization: Not on file    Attends meetings of clubs or organizations: Not on file    Relationship status: Not on file  Other Topics Concern   Not on file  Social History Narrative   Separated - 1 daughter and 1 son   Retired Horticulturist, commercial   Former smoker, no EtOH     Review of Systems: A 12 point ROS discussed and pertinent positives are indicated in the HPI above.  All other systems are negative.  Review of Systems  Constitutional: Negative for fatigue and fever.  Respiratory: Negative for cough and shortness of breath.   Cardiovascular: Negative for chest pain.  Gastrointestinal: Negative for abdominal pain.  Musculoskeletal: Negative for back pain.  Psychiatric/Behavioral: Negative for behavioral problems and confusion.    Vital Signs: BP 127/61 (BP Location: Left Arm)    Pulse 68    Resp 17    Ht 5\' 5"  (1.651 m)    Wt 178 lb (80.7 kg)    SpO2 96%    BMI 29.62 kg/m   Physical Exam Vitals signs and nursing note reviewed.  Constitutional:      General: He is not in acute distress.    Appearance: Normal appearance.  HENT:     Mouth/Throat:     Mouth: Mucous membranes are moist.     Pharynx: Oropharynx is clear.  Cardiovascular:     Rate and Rhythm: Normal rate and regular rhythm.     Heart  sounds: No murmur. No friction rub. No gallop.   Pulmonary:     Effort: Pulmonary effort is normal. No respiratory distress.     Breath sounds: Normal breath sounds.  Abdominal:     General: Abdomen is flat. There is no distension.     Palpations: Abdomen is soft.  Skin:    General: Skin is warm and dry.  Neurological:     General: No focal deficit present.     Mental Status: He is alert and oriented to person, place, and time. Mental status is at baseline.  Psychiatric:        Mood and Affect: Mood normal.        Behavior: Behavior normal.        Thought Content: Thought content normal.        Judgment: Judgment normal.      MD Evaluation Airway: WNL Heart: WNL Abdomen: WNL Chest/ Lungs: WNL ASA  Classification: 3 Mallampati/Airway Score: One   Imaging: Ct Head Wo Contrast  Result Date: 07/04/2019 CLINICAL DATA:  Patient found on the floor.  Disoriented. EXAM: CT HEAD WITHOUT CONTRAST CT CERVICAL SPINE WITHOUT CONTRAST TECHNIQUE: Multidetector CT imaging of the head and cervical spine was performed following the standard protocol without intravenous contrast. Multiplanar CT image reconstructions of the cervical spine were also generated. COMPARISON:  Brain and cervical spine CT 04/08/2019. FINDINGS: CT HEAD FINDINGS Brain: Ventricles and sulci are appropriate for patient's age. No evidence for acute cortically based infarct, intracranial hemorrhage, mass lesion or mass-effect. Periventricular and subcortical white matter hypodensities compatible with chronic microvascular ischemic changes. Vascular: Unremarkable Skull: Intact. Sinuses/Orbits: Mucosal thickening involving the paranasal sinuses. Mastoid air cells are unremarkable. Orbits are unremarkable. Other: None. CT CERVICAL SPINE FINDINGS Alignment: Straightening of the normal cervical lordosis. Skull base and vertebrae: Intact. Soft tissues and spinal canal: No prevertebral fluid or swelling. No visible canal hematoma. Disc  levels: Degenerative disc disease most pronounced C4-5, C5-6 and C6-7. Preservation of the vertebral body heights.  No acute fracture. Upper chest: Unremarkable Other: Bilateral carotid bifurcation calcified atherosclerotic plaque. IMPRESSION: No acute intracranial process. Atrophy and chronic microvascular ischemic changes. No acute cervical spine fracture. Multilevel degenerative disc disease. Electronically Signed   By: Lovey Newcomer M.D.   On: 07/04/2019 21:21   Ct Cervical Spine Wo Contrast  Result Date: 07/04/2019 CLINICAL DATA:  Patient found on the floor.  Disoriented. EXAM: CT HEAD WITHOUT CONTRAST CT CERVICAL SPINE WITHOUT CONTRAST TECHNIQUE: Multidetector CT imaging of the head and cervical spine was performed following the standard protocol without intravenous contrast. Multiplanar CT image reconstructions of the cervical spine were also generated. COMPARISON:  Brain and cervical spine CT 04/08/2019. FINDINGS: CT HEAD FINDINGS Brain: Ventricles and sulci are appropriate for patient's age. No evidence for acute cortically based infarct, intracranial hemorrhage, mass lesion or mass-effect. Periventricular and subcortical white matter hypodensities compatible with chronic microvascular ischemic changes. Vascular: Unremarkable Skull: Intact. Sinuses/Orbits: Mucosal thickening involving the paranasal sinuses. Mastoid air cells are unremarkable. Orbits are unremarkable. Other: None. CT CERVICAL SPINE FINDINGS Alignment: Straightening of the normal cervical lordosis. Skull base and vertebrae: Intact. Soft tissues and spinal canal: No prevertebral fluid or swelling. No visible canal hematoma. Disc levels: Degenerative disc disease most pronounced C4-5, C5-6 and C6-7. Preservation of the vertebral body heights. No acute fracture. Upper chest: Unremarkable Other: Bilateral carotid bifurcation calcified atherosclerotic plaque. IMPRESSION: No acute intracranial process. Atrophy and chronic microvascular ischemic  changes. No acute cervical spine fracture. Multilevel degenerative disc disease. Electronically Signed   By: Lovey Newcomer M.D.   On: 07/04/2019 21:21   US Renal  Result Date: 07/09/2019 CLINICAL DATA:  Acute renal failure. EXAM: RENAL / URINARY TRACT ULTRASOUND COMPLETE COMPARISON:  PET CT 05/26/2019 FINDINGS: Right Kidney: Renal measurements: 11 x 6.6 x 7.3 cm = volume: 179.7 mL. No hydronephrosis. Complex cyst in the mid right kidney measuring 7 x 3.9 x 5.2 cm. Stone on prior PET-CT is not visualized. Left Kidney: Renal measurements: 10 x 6.2 x 5.5 cm = volume: 179 mL. Echogenicity within normal limits. No mass or hydronephrosis visualized. Bladder: Appears normal for degree of bladder distention. IMPRESSION: 1. No obstructive uropathy. 2. Right renal cyst. Right nephrolithiasis on prior PET CT not demonstrated sonographically. Electronically Signed   By: Keith Rake M.D.   On: 07/09/2019 00:00   Dg Chest Port 1 View  Result Date: 07/12/2019 CLINICAL DATA:  Wheezing and shortness of breath. EXAM: PORTABLE CHEST 1 VIEW COMPARISON:  Chest CT April 22nd 2020 FINDINGS: Cardiomediastinal silhouette is normal. Mediastinal contours appear intact. Right pulmonary masses are not significantly changed from the prior studies. Right pleural thickening versus small pleural effusion. Bibasilar atelectasis. Osseous structures are without acute abnormality. Soft tissues are grossly normal. IMPRESSION: 1. Right pulmonary masses are not significantly changed from the prior studies. 2. Right pleural thickening versus small pleural effusion. 3. Bibasilar atelectasis. Electronically Signed   By: Fidela Salisbury M.D.   On: 07/12/2019 09:11   Dg Chest Port 1 View  Result Date: 07/08/2019 CLINICAL DATA:  Shortness of breath and fever. EXAM: PORTABLE CHEST 1 VIEW COMPARISON:  PET-CT May 26, 2019 and chest radiograph April 28, 2019 FINDINGS: The previously noted right lower lobe mass is again apparent, measuring 5.1 x  3.8 cm. There is no appreciable edema or consolidation. Heart is mildly enlarged with pulmonary vascularity normal. No adenopathy evident. No bone lesions. IMPRESSION: Persistent right lower lobe mass. No new opacity evident. Heart mildly enlarged. No adenopathy demonstrable by radiography. Electronically Signed  By: Lowella Grip III M.D.   On: 07/08/2019 11:33    Labs:  CBC: Recent Labs    07/11/19 0238 07/13/19 0741 07/21/19 1026 07/30/19 0940  WBC 18.1* 7.3 8.7 PENDING  HGB 8.6* 8.5* 8.6* 9.8*  HCT 26.4* 26.7* 28.1* 32.5*  PLT 200 218 413* 393    COAGS: Recent Labs    04/08/19 0410 04/20/19 0147 04/28/19 1655 07/08/19 1220 07/09/19 0117 07/30/19 0940  INR 1.3* 1.4* 1.2 1.4* 1.4* 1.1  APTT 25 32  --  46*  --   --     BMP: Recent Labs    07/13/19 0741 07/14/19 0334 07/15/19 0427 07/21/19 1026  NA 146* 142 143 143  K 3.5 3.2* 4.4 3.7  CL 111 107 107 103  CO2 24 26 28 29   GLUCOSE 102* 98 92 97  BUN 9 9 7* 11  CALCIUM 8.2* 8.4* 8.7* 9.0  CREATININE 1.49* 1.37* 1.26* 1.67*  GFRNONAA 46* 50* 56* 40*  GFRAA 53* 58* >60 46*    LIVER FUNCTION TESTS: Recent Labs    04/28/19 1529 04/29/19 0014 07/08/19 1037 07/21/19 1026  BILITOT 0.8 1.2 1.3* 0.4  AST 31 14* 16 11*  ALT 14 12 10 8   ALKPHOS 74 70 61 65  PROT 6.1* 5.8* 6.7 6.5  ALBUMIN 2.8* 2.6* 3.3* 2.7*    TUMOR MARKERS: No results for input(s): AFPTM, CEA, CA199, CHROMGRNA in the last 8760 hours.  Assessment and Plan: Patient with past medical history of COPD, DM, HTN presents with complaint of right lower lobe lung mass.  IR consulted for lung mass biopsy at the request of Dr. Julien Nordmann. Case reviewed by Dr. Vernard Gambles who approves patient for procedure.  Patient presents today in their usual state of health.  He has been NPO and is not currently on blood thinners.   Risks and benefits of CT guided lung nodule biopsy was discussed with the patient including, but not limited to bleeding, hemoptysis,  respiratory failure requiring intubation, infection, pneumothorax requiring chest tube placement, stroke from air embolism or even death.  All of the patient's questions were answered and the patient is agreeable to proceed.  Consent signed and in chart.  Thank you for this interesting consult.  I greatly enjoyed meeting John [name] and look forward to participating in their care.  A copy of this report was sent to the requesting John [name] on this date.  Electronically Signed: Docia Barrier, PA 07/30/2019, 10:49 AM   I spent a total of  30 Minutes   in face to face in clinical consultation, greater than 50% of which was counseling/coordinating care for lung mass.

## 2019-08-01 ENCOUNTER — Emergency Department (HOSPITAL_COMMUNITY)
Admission: EM | Admit: 2019-08-01 | Discharge: 2019-08-01 | Disposition: A | Discharge status: Home / Self Care | Payer: Medicare Other | Attending: Emergency Medicine | Admitting: Emergency Medicine

## 2019-08-01 ENCOUNTER — Emergency Department (HOSPITAL_COMMUNITY): Payer: Medicare Other

## 2019-08-01 ENCOUNTER — Other Ambulatory Visit: Payer: Self-pay

## 2019-08-01 DIAGNOSIS — I5032 Chronic diastolic (congestive) heart failure: Secondary | ICD-10-CM | POA: Diagnosis not present

## 2019-08-01 DIAGNOSIS — W06XXXA Fall from bed, initial encounter: Secondary | ICD-10-CM | POA: Diagnosis not present

## 2019-08-01 DIAGNOSIS — I252 Old myocardial infarction: Secondary | ICD-10-CM | POA: Diagnosis not present

## 2019-08-01 DIAGNOSIS — Y999 Unspecified external cause status: Secondary | ICD-10-CM | POA: Diagnosis not present

## 2019-08-01 DIAGNOSIS — Z7984 Long term (current) use of oral hypoglycemic drugs: Secondary | ICD-10-CM | POA: Insufficient documentation

## 2019-08-01 DIAGNOSIS — Z79899 Other long term (current) drug therapy: Secondary | ICD-10-CM | POA: Insufficient documentation

## 2019-08-01 DIAGNOSIS — Z87891 Personal history of nicotine dependence: Secondary | ICD-10-CM | POA: Diagnosis not present

## 2019-08-01 DIAGNOSIS — I130 Hypertensive heart and chronic kidney disease with heart failure and stage 1 through stage 4 chronic kidney disease, or unspecified chronic kidney disease: Secondary | ICD-10-CM | POA: Diagnosis not present

## 2019-08-01 DIAGNOSIS — Y9384 Activity, sleeping: Secondary | ICD-10-CM | POA: Insufficient documentation

## 2019-08-01 DIAGNOSIS — I2510 Atherosclerotic heart disease of native coronary artery without angina pectoris: Secondary | ICD-10-CM | POA: Insufficient documentation

## 2019-08-01 DIAGNOSIS — Y92003 Bedroom of unspecified non-institutional (private) residence as the place of occurrence of the external cause: Secondary | ICD-10-CM | POA: Diagnosis not present

## 2019-08-01 DIAGNOSIS — R4182 Altered mental status, unspecified: Secondary | ICD-10-CM | POA: Diagnosis present

## 2019-08-01 DIAGNOSIS — N183 Chronic kidney disease, stage 3 (moderate): Secondary | ICD-10-CM | POA: Diagnosis not present

## 2019-08-01 DIAGNOSIS — J449 Chronic obstructive pulmonary disease, unspecified: Secondary | ICD-10-CM | POA: Diagnosis not present

## 2019-08-01 DIAGNOSIS — Z85118 Personal history of other malignant neoplasm of bronchus and lung: Secondary | ICD-10-CM | POA: Insufficient documentation

## 2019-08-01 DIAGNOSIS — F0150 Vascular dementia without behavioral disturbance: Secondary | ICD-10-CM | POA: Diagnosis not present

## 2019-08-01 DIAGNOSIS — E1122 Type 2 diabetes mellitus with diabetic chronic kidney disease: Secondary | ICD-10-CM | POA: Diagnosis not present

## 2019-08-01 DIAGNOSIS — I951 Orthostatic hypotension: Secondary | ICD-10-CM

## 2019-08-01 NOTE — ED Notes (Signed)
Patient transported to CT 

## 2019-08-01 NOTE — ED Notes (Signed)
Spoke w pt's daughter who is sending pt's granddaughter to get him.

## 2019-08-01 NOTE — ED Triage Notes (Signed)
BIB EMS; reported fall off of bed; pt denies LOC; head injury and any pain; EMS reported family concern for AMS d/t fall,

## 2019-08-01 NOTE — ED Notes (Signed)
Call grandaughterJanett Billow [phone]) when ready to be picked up

## 2019-08-01 NOTE — ED Provider Notes (Signed)
[facility] EMERGENCY DEPARTMENT Provider Note   CSN: [id number] Arrival date & time: 08/01/19  0149    History   Chief Complaint Chief Complaint  Patient presents with   Fall    HPI John [name] is a 73 y.o. male.   The history is provided by the patient and the EMS personnel.  Fall  He has history of hypertension, hyperlipidemia, myocardial infarction, congestive heart failure, COPD and comes in following a fall at home.  Apparently, he fell out of bed.  He is unable to give me any additional specifics.  He denies loss of consciousness and denies any complaints.  EMS reported that family was concerned that his mental status was altered.  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   MI (myocardial infarction) ([place])    Pulmonary hypertension ([place])     Patient Active Problem List   Diagnosis Date Noted   Acute renal failure (ARF) ([place]) 07/08/2019   Hypokalemia 07/08/2019   Vascular dementia without behavioral disturbance (HCC)    Helicobacter pylori gastritis    Chronic duodenal ulcer with hemorrhage    Multiple gastric ulcers    Acute esophagitis    Gastrointestinal hemorrhage 04/20/2019   Vitamin B12 deficiency 04/20/2019   Folate deficiency 04/20/2019   Dehydration 04/20/2019   Hypotension 04/20/2019   Melena    Acute blood loss anemia    CKD (chronic kidney disease), stage III ([place]) 04/08/2019   Right lower lobe lung mass 04/08/2019   Syncope and collapse 04/08/2019   Symptomatic anemia 04/08/2019   Occult GI bleeding 04/08/2019   Hyperglycemia 04/08/2019   Leukocytosis 04/08/2019   Syncope 04/08/2019   Low  hemoglobin    Dyspnea on exertion 08/25/2017   Bursitis of elbow 03/15/2016   Obesity (BMI 30-39.9) 12/03/2015   Primary cancer of right lower lobe of lung ([place]) 06/14/2015   Solitary pulmonary nodule 03/13/2015   RVF (right ventricular failure) ([place]) 11/04/2014   COPD II/III with reversibility  12/19/2012   Chronic respiratory failure with hypoxia ([place]) 12/18/2012   Drug-induced hyperglycemia 12/08/2012   Allergic drug rash 12/06/2012   Pulmonary HTN ([place]) 03/18/2012   Abnormal echocardiogram 03/18/2012   LV dysfunction 03/18/2012   COPD exacerbation ([place]) 02/22/2012   Chronic diastolic CHF (congestive heart failure) ([place]) 02/22/2012   HTN (hypertension) 02/22/2012   CAD (coronary artery disease) 02/22/2012   Acute on chronic systolic heart failure ([place]) 02/22/2012    Past Surgical History:  Procedure Laterality Date   APPENDECTOMY     BIOPSY  04/21/2019   Procedure: BIOPSY;  Surgeon: Gatha Mayer, MD;  Location: [place];  Service: Endoscopy;;   ESOPHAGOGASTRODUODENOSCOPY (EGD) WITH PROPOFOL N/A 04/21/2019   Procedure: ESOPHAGOGASTRODUODENOSCOPY (EGD) WITH PROPOFOL;  Surgeon: Gatha Mayer, MD;  Location[facility] ENDOSCOPY;  Service: Endoscopy;  Laterality: N/A;   ESOPHAGOGASTRODUODENOSCOPY (EGD) WITH PROPOFOL N/A 04/29/2019   Procedure: ESOPHAGOGASTRODUODENOSCOPY (EGD) WITH PROPOFOL;  Surgeon: Mauri Pole, MD;  Location: [place] ENDOSCOPY;  Service: Endoscopy;  Laterality: N/A;   HEMOSTASIS CLIP PLACEMENT  04/29/2019   Procedure: HEMOSTASIS CLIP PLACEMENT;  Surgeon: Mauri Pole, MD;  Location: [facility]  ENDOSCOPY;  Service: Endoscopy;;  Clip placed as marker not for bleeding control   IR ANGIOGRAM SELECTIVE EACH ADDITIONAL VESSEL  04/29/2019   IR ANGIOGRAM SELECTIVE EACH ADDITIONAL VESSEL  04/29/2019   IR ANGIOGRAM SELECTIVE EACH ADDITIONAL VESSEL  04/29/2019   IR ANGIOGRAM VISCERAL SELECTIVE  04/29/2019   IR EMBO ART  VEN HEMORR LYMPH EXTRAV  INC  GUIDE ROADMAPPING  04/29/2019   IR US GUIDE VASC ACCESS RIGHT  04/29/2019   LUNG BIOPSY          Home Medications    Prior to Admission medications   Medication Sig Start Date End Date Taking? Authorizing Provider  albuterol (PROAIR HFA) 108 (90 BASE) MCG/ACT inhaler INHALE 2 PUFFS BY MOUTH EVERY 4 HOURS AS NEEDED FOR WHEEZING Patient taking differently: Inhale 2 puffs into the lungs every 4 (four) hours as needed for wheezing.  03/13/15   Tanda Rockers, MD  allopurinol (ZYLOPRIM) 300 MG tablet Take 300 mg by mouth daily. 04/07/19   [provider]  amLODipine (NORVASC) 10 MG tablet Take 10 mg by mouth daily. 05/07/19   [provider]  atorvastatin (LIPITOR) 20 MG tablet Take 1 tablet (20 mg total) by mouth daily. 09/26/16   Tanda Rockers, MD  bisoprolol (ZEBETA) 5 MG tablet Take 1 tablet (5 mg total) by mouth daily. 09/26/16   Tanda Rockers, MD  BREO ELLIPTA 100-25 MCG/INH AEPB Inhale 1 puff into the lungs 2 (two) times a day.  05/07/19   [provider]  Cholecalciferol (VITAMIN D3) 50 MCG (2000 UT) capsule Take 2,000 Units by mouth daily.  05/07/19   [provider]  cloNIDine (CATAPRES) 0.1 MG tablet Take 0.1 mg by mouth 2 (two) times a day. 07/20/19   [provider]  ferrous sulfate 325 (65 FE) MG tablet Take 1 tablet (325 mg total) by mouth every Monday, Wednesday, and Friday. 05/03/19 08/01/19  Florencia Reasons, MD  folic acid (FOLVITE) 1 MG tablet Take 1 tablet (1 mg total) by mouth daily. 04/09/19 04/08/20  Donne Hazel, MD  furosemide (LASIX) 20 MG tablet Take 1 tablet (20 mg total) by mouth every Monday, Wednesday, and Friday. 05/03/19   Florencia Reasons, MD  metFORMIN (GLUCOPHAGE) 1000 MG tablet Take 0.5 tablets (500 mg total) by mouth 2 (two) times daily. 05/01/19   Florencia Reasons, MD  naproxen (NAPROSYN) 500 MG tablet Take 500 mg by mouth 2 (two) times daily as needed for moderate pain.    [provider]  pantoprazole (PROTONIX) 40 MG tablet Take 1 tablet  (40 mg total) by mouth 2 (two) times daily before a meal. 05/28/19   Nandigam, Venia Minks, MD  QUEtiapine (SEROQUEL) 25 MG tablet Take 1 tablet (25 mg total) by mouth 2 (two) times daily. 07/15/19   Kayleen Memos, DO  senna-docusate (SENOKOT-S) 8.6-50 MG tablet Take 1 tablet by mouth at bedtime. 05/01/19   Florencia Reasons, MD  sucralfate (CARAFATE) 1 g tablet Take 1 g by mouth See admin instructions. Four time daily and at bedtime 06/23/19   [provider]  Vitamin D, Ergocalciferol, (DRISDOL) 1.25 MG (50000 UT) CAPS capsule Take 50,000 Units by mouth every Friday.  03/10/19   [provider]    Family History Family History  Problem Relation Age of Onset   Heart disease Mother    Cancer Neg Hx     Social History Social History   Tobacco Use   Smoking status: Former Smoker    Packs/day:  1.50    Years: 60.00    Pack years: 90.00    Types: Cigarettes    Quit date: 09/29/2012    Years since quitting: 6.8   Smokeless tobacco: Never Used  Substance Use Topics   Alcohol use: No    Alcohol/week: 0.0 standard drinks   Drug use: No     Allergies   Levaquin [levofloxacin in d5w] and Lisinopril   Review of Systems Review of Systems  All other systems reviewed and are negative.    Physical Exam Updated Vital Signs BP 128/67 (BP Location: Right Arm)    Pulse 93    Temp 98.5 F (36.9 C) (Oral)    Resp 17    Ht 5\' 9"  (1.753 m)    Wt 79.4 kg    SpO2 95%    BMI 25.84 kg/m   Physical Exam Vitals signs and nursing note reviewed.    73 year old male, resting comfortably and in no acute distress. Vital signs are normal. Oxygen saturation is 95%, which is normal. Head is normocephalic and atraumatic. PERRLA, EOMI. Oropharynx is clear. Neck is nontender without adenopathy or JVD. Back is nontender and there is no CVA tenderness. Lungs are clear without rales, wheezes, or rhonchi. Chest is nontender. Heart has regular rate and rhythm without murmur. Abdomen is soft,  flat, nontender without masses or hepatosplenomegaly and peristalsis is normoactive. Extremities have no cyanosis or edema, full range of motion is present. Skin is warm and dry without rash. Neurologic: Awake and alert and conversant, oriented to person and place but only partly oriented to time (knows the year but not day or date), cranial nerves are intact, there are no motor or sensory deficits.  ED Treatments / Results   EKG EKG Interpretation  Date/Time:  Sunday August 01 2019 01:53:46 EDT Ventricular Rate:  91 PR Interval:    QRS Duration: 100 QT Interval:  374 QTC Calculation: 461 R Axis:   38 Text Interpretation:  Sinus rhythm Normal ECG When compared with ECG of 07/08/2019, No significant change was found Confirmed by Delora Fuel ([id number]) on 08/01/2019 2:22:37 AM   Radiology Ct Head Wo Contrast  Result Date: 08/01/2019 CLINICAL DATA:  Fall out of bed EXAM: CT HEAD WITHOUT CONTRAST CT CERVICAL SPINE WITHOUT CONTRAST TECHNIQUE: Multidetector CT imaging of the head and cervical spine was performed following the standard protocol without intravenous contrast. Multiplanar CT image reconstructions of the cervical spine were also generated. COMPARISON:  07/04/2019 FINDINGS: CT HEAD FINDINGS Brain: There is atrophy and chronic small vessel disease changes. No acute intracranial abnormality. Specifically, no hemorrhage, hydrocephalus, mass lesion, acute infarction, or significant intracranial injury. Vascular: No hyperdense vessel or unexpected calcification. Skull: No acute calvarial abnormality. Sinuses/Orbits: Mucosal thickening within the maxillary sinuses. No air-fluid levels. Other: None CT CERVICAL SPINE FINDINGS Alignment: No subluxation Skull base and vertebrae: No acute fracture. No primary bone lesion or focal pathologic process. Soft tissues and spinal canal: No prevertebral fluid or swelling. No visible canal hematoma. Disc levels: Diffuse degenerative disc disease, most pronounced  C4-5 through C6-7. Mild bilateral degenerative facet disease. Upper chest: No acute findings. Other: None IMPRESSION: Atrophy, chronic microvascular disease. No acute intracranial abnormality. Cervical spondylosis. No acute bony abnormality in the cervical spine. Electronically Signed   By: Rolm Baptise M.D.   On: 08/01/2019 03:26   Ct Cervical Spine Wo Contrast  Result Date: 08/01/2019 CLINICAL DATA:  Fall out of bed EXAM: CT HEAD WITHOUT CONTRAST CT CERVICAL SPINE WITHOUT CONTRAST TECHNIQUE:  Multidetector CT imaging of the head and cervical spine was performed following the standard protocol without intravenous contrast. Multiplanar CT image reconstructions of the cervical spine were also generated. COMPARISON:  07/04/2019 FINDINGS: CT HEAD FINDINGS Brain: There is atrophy and chronic small vessel disease changes. No acute intracranial abnormality. Specifically, no hemorrhage, hydrocephalus, mass lesion, acute infarction, or significant intracranial injury. Vascular: No hyperdense vessel or unexpected calcification. Skull: No acute calvarial abnormality. Sinuses/Orbits: Mucosal thickening within the maxillary sinuses. No air-fluid levels. Other: None CT CERVICAL SPINE FINDINGS Alignment: No subluxation Skull base and vertebrae: No acute fracture. No primary bone lesion or focal pathologic process. Soft tissues and spinal canal: No prevertebral fluid or swelling. No visible canal hematoma. Disc levels: Diffuse degenerative disc disease, most pronounced C4-5 through C6-7. Mild bilateral degenerative facet disease. Upper chest: No acute findings. Other: None IMPRESSION: Atrophy, chronic microvascular disease. No acute intracranial abnormality. Cervical spondylosis. No acute bony abnormality in the cervical spine. Electronically Signed   By: Rolm Baptise M.D.   On: 08/01/2019 03:26    Procedures Procedures   Medications Ordered in ED Medications - No data to display   Initial Impression / Assessment and  Plan / ED Course  I have reviewed the triage vital signs and the nursing notes.  Pertinent imaging results that were available during my care of the patient were reviewed by me and considered in my medical decision making (see chart for details).  Fall from bed without apparent injury.  He will be sent for CT of head and cervical spine.  ECG was obtained and was normal.  Old records are reviewed confirming history of lung cancer with probable recurrence and scheduled to start SBRT.  CT scans show no evidence of acute injury.  He is discharged home.  Final Clinical Impressions(s) / ED Diagnoses   Final diagnoses:  Fall from bed, initial encounter    ED Discharge Orders    None       Delora Fuel, MD 01/07/31 [phone]

## 2019-08-02 ENCOUNTER — Ambulatory Visit: Payer: Medicare Other | Admitting: Radiation Oncology

## 2019-08-02 ENCOUNTER — Inpatient Hospital Stay: Payer: Medicare Other | Attending: Internal Medicine | Admitting: Physician Assistant

## 2019-08-02 ENCOUNTER — Inpatient Hospital Stay: Payer: Medicare Other

## 2019-08-02 ENCOUNTER — Ambulatory Visit: Payer: Medicare Other

## 2019-08-02 ENCOUNTER — Inpatient Hospital Stay: Payer: Medicare Other | Admitting: Nutrition

## 2019-08-02 DIAGNOSIS — Z7984 Long term (current) use of oral hypoglycemic drugs: Secondary | ICD-10-CM | POA: Insufficient documentation

## 2019-08-02 DIAGNOSIS — E876 Hypokalemia: Secondary | ICD-10-CM | POA: Insufficient documentation

## 2019-08-02 DIAGNOSIS — Z79899 Other long term (current) drug therapy: Secondary | ICD-10-CM | POA: Insufficient documentation

## 2019-08-02 DIAGNOSIS — E119 Type 2 diabetes mellitus without complications: Secondary | ICD-10-CM | POA: Insufficient documentation

## 2019-08-02 DIAGNOSIS — E538 Deficiency of other specified B group vitamins: Secondary | ICD-10-CM | POA: Insufficient documentation

## 2019-08-02 DIAGNOSIS — J449 Chronic obstructive pulmonary disease, unspecified: Secondary | ICD-10-CM | POA: Insufficient documentation

## 2019-08-02 DIAGNOSIS — I10 Essential (primary) hypertension: Secondary | ICD-10-CM | POA: Insufficient documentation

## 2019-08-02 DIAGNOSIS — I252 Old myocardial infarction: Secondary | ICD-10-CM | POA: Insufficient documentation

## 2019-08-02 DIAGNOSIS — Z7951 Long term (current) use of inhaled steroids: Secondary | ICD-10-CM | POA: Insufficient documentation

## 2019-08-02 DIAGNOSIS — E785 Hyperlipidemia, unspecified: Secondary | ICD-10-CM | POA: Insufficient documentation

## 2019-08-02 DIAGNOSIS — C3431 Malignant neoplasm of lower lobe, right bronchus or lung: Secondary | ICD-10-CM | POA: Insufficient documentation

## 2019-08-03 ENCOUNTER — Ambulatory Visit: Payer: Medicare Other | Admitting: Radiation Oncology

## 2019-08-03 ENCOUNTER — Ambulatory Visit: Payer: Medicare Other

## 2019-08-03 ENCOUNTER — Telehealth: Payer: Self-pay | Admitting: Internal Medicine

## 2019-08-03 NOTE — Telephone Encounter (Signed)
R/s appt per 8/03 sch message- pt wife aware of appt /.

## 2019-08-04 ENCOUNTER — Ambulatory Visit: Payer: Medicare Other | Admitting: Radiation Oncology

## 2019-08-04 ENCOUNTER — Ambulatory Visit: Payer: Medicare Other

## 2019-08-05 ENCOUNTER — Inpatient Hospital Stay: Payer: Medicare Other

## 2019-08-05 ENCOUNTER — Ambulatory Visit: Payer: Medicare Other | Admitting: Radiation Oncology

## 2019-08-05 ENCOUNTER — Other Ambulatory Visit: Payer: Self-pay

## 2019-08-05 ENCOUNTER — Inpatient Hospital Stay (HOSPITAL_BASED_OUTPATIENT_CLINIC_OR_DEPARTMENT_OTHER): Payer: Medicare Other | Admitting: Physician Assistant

## 2019-08-05 ENCOUNTER — Telehealth: Payer: Self-pay | Admitting: *Deleted

## 2019-08-05 ENCOUNTER — Encounter: Payer: Self-pay | Admitting: Urology

## 2019-08-05 ENCOUNTER — Other Ambulatory Visit: Payer: Self-pay | Admitting: *Deleted

## 2019-08-05 ENCOUNTER — Encounter: Payer: Self-pay | Admitting: Physician Assistant

## 2019-08-05 VITALS — BP 135/66 | HR 77 | Temp 98.0°F | Resp 18

## 2019-08-05 VITALS — BP 86/51 | HR 70 | Temp 98.0°F | Resp 18 | Ht 69.0 in | Wt 173.9 lb

## 2019-08-05 DIAGNOSIS — I10 Essential (primary) hypertension: Secondary | ICD-10-CM | POA: Diagnosis not present

## 2019-08-05 DIAGNOSIS — E538 Deficiency of other specified B group vitamins: Secondary | ICD-10-CM | POA: Diagnosis not present

## 2019-08-05 DIAGNOSIS — E785 Hyperlipidemia, unspecified: Secondary | ICD-10-CM | POA: Diagnosis not present

## 2019-08-05 DIAGNOSIS — Z7189 Other specified counseling: Secondary | ICD-10-CM

## 2019-08-05 DIAGNOSIS — C3431 Malignant neoplasm of lower lobe, right bronchus or lung: Secondary | ICD-10-CM

## 2019-08-05 DIAGNOSIS — I252 Old myocardial infarction: Secondary | ICD-10-CM | POA: Diagnosis not present

## 2019-08-05 DIAGNOSIS — E876 Hypokalemia: Secondary | ICD-10-CM

## 2019-08-05 DIAGNOSIS — Z7984 Long term (current) use of oral hypoglycemic drugs: Secondary | ICD-10-CM | POA: Diagnosis not present

## 2019-08-05 DIAGNOSIS — E860 Dehydration: Secondary | ICD-10-CM

## 2019-08-05 DIAGNOSIS — I959 Hypotension, unspecified: Secondary | ICD-10-CM

## 2019-08-05 DIAGNOSIS — Z7951 Long term (current) use of inhaled steroids: Secondary | ICD-10-CM | POA: Diagnosis not present

## 2019-08-05 DIAGNOSIS — E119 Type 2 diabetes mellitus without complications: Secondary | ICD-10-CM | POA: Diagnosis not present

## 2019-08-05 DIAGNOSIS — J449 Chronic obstructive pulmonary disease, unspecified: Secondary | ICD-10-CM | POA: Diagnosis not present

## 2019-08-05 DIAGNOSIS — Z79899 Other long term (current) drug therapy: Secondary | ICD-10-CM | POA: Diagnosis not present

## 2019-08-05 LAB — CMP (CANCER CENTER ONLY)
ALT: 7 U/L (ref 0–44)
AST: 8 U/L — ABNORMAL LOW (ref 15–41)
Albumin: 3.2 g/dL — ABNORMAL LOW (ref 3.5–5.0)
Alkaline Phosphatase: 73 U/L (ref 38–126)
Anion gap: 13 (ref 5–15)
BUN: 12 mg/dL (ref 8–23)
CO2: 28 mmol/L (ref 22–32)
Calcium: 9.4 mg/dL (ref 8.9–10.3)
Chloride: 103 mmol/L (ref 98–111)
Creatinine: 1.48 mg/dL — ABNORMAL HIGH (ref 0.61–1.24)
GFR, Est AFR Am: 53 mL/min — ABNORMAL LOW (ref >60)
GFR, Estimated: 46 mL/min — ABNORMAL LOW (ref >60)
Glucose, Bld: 125 mg/dL — ABNORMAL HIGH (ref 70–99)
Potassium: 2.6 mmol/L — CL (ref 3.5–5.1)
Sodium: 144 mmol/L (ref 135–145)
Total Bilirubin: 0.4 mg/dL (ref 0.3–1.2)
Total Protein: 6.7 g/dL (ref 6.5–8.1)

## 2019-08-05 LAB — CBC WITH DIFFERENTIAL (CANCER CENTER ONLY)
Abs Immature Granulocytes: 0.05 10*3/uL (ref 0.00–0.07)
Basophils Absolute: 0 10*3/uL (ref 0.0–0.1)
Basophils Relative: 0 %
Eosinophils Absolute: 0.3 10*3/uL (ref 0.0–0.5)
Eosinophils Relative: 4 %
HCT: 30.3 % — ABNORMAL LOW (ref 39.0–52.0)
Hemoglobin: 9.5 g/dL — ABNORMAL LOW (ref 13.0–17.0)
Immature Granulocytes: 1 %
Lymphocytes Relative: 13 %
Lymphs Abs: 1.1 10*3/uL (ref 0.7–4.0)
MCH: 30.6 pg (ref 26.0–34.0)
MCHC: 31.4 g/dL (ref 30.0–36.0)
MCV: 97.7 fL (ref 80.0–100.0)
Monocytes Absolute: 0.7 10*3/uL (ref 0.1–1.0)
Monocytes Relative: 8 %
Neutro Abs: 6.4 10*3/uL (ref 1.7–7.7)
Neutrophils Relative %: 74 %
Platelet Count: 228 10*3/uL (ref 150–400)
RBC: 3.1 MIL/uL — ABNORMAL LOW (ref 4.22–5.81)
RDW: 17 % — ABNORMAL HIGH (ref 11.5–15.5)
WBC Count: 8.5 10*3/uL (ref 4.0–10.5)
nRBC: 0 % (ref 0.0–0.2)

## 2019-08-05 MED ORDER — SODIUM CHLORIDE 0.9 % IV SOLN
Freq: Once | INTRAVENOUS | Status: AC
  Administered 2019-08-05: 13:00:00 via INTRAVENOUS
  Filled 2019-08-05: qty 1000

## 2019-08-05 MED ORDER — SODIUM CHLORIDE 0.9 % IV SOLN
Freq: Once | INTRAVENOUS | Status: AC
  Administered 2019-08-05: 13:00:00 via INTRAVENOUS
  Filled 2019-08-05: qty 250

## 2019-08-05 NOTE — Progress Notes (Signed)
The proposed treatment discussed in cancer conference 08/05/2019 is for discussion purpose only and is not a binding recommendation.  The patient was not physically examined nor present for their treatment options.  Therefore, final treatment plans cannot be decided.

## 2019-08-05 NOTE — Telephone Encounter (Signed)
Received call report from [facility].  "Today's K+ = 2.6."  Reached collaborative with results.  Response: "She is aware."

## 2019-08-05 NOTE — Progress Notes (Signed)
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  PRIOR THERAPY: Course of curative radiotherapy to the right lower lobe lung nodule as well as mediastinal lymphadenopathy under the care of Dr. Tammi Klippel in 2016.  CURRENT THERAPY: A course of radiotherapy to the recurrent lesion in the right lower lobe under the care of Dr. Tammi Klippel. Expected start date 08/06/2019.  INTERVAL HISTORY: John [name] 73 y.o. male returns to the clinic for a follow-up visit. The patient is feeling well today without any concerning complaints except for decreased appetite and associated weight loss. He has lost an additional 9 lbs since his last appointment. He has an appointment on 08/10/2019 with the nutritionist for further evaluation and recommendations of his weight loss.   Otherwise, the patient is feeling fairly well today. He states that he lives at home with the assistance of his daughter. He denies any recent fevers, chills, or night sweats. He denies any chest pain, shortness of breath, cough, or hemoptysis. He denies any nausea, vomiting, diarrhea, or constipation.  He denies any headache or visual changes.The patient has a history of recurrent non-small cell lung cancer, squamous cell carcinoma of the right lower lobe. Recent imaging studies revealed an enlarging right lower lobe lung mass. He recently had a CT-guided biopsy to confirm diagnosis.  He is here today for evaluation to review his biopsy results and treatment recommendations.   MEDICAL HISTORY: Past Medical History:  Diagnosis Date  . B12 deficiency   . CHF (congestive heart failure) ([facility])   . COPD (chronic obstructive pulmonary disease) ([facility])   .  Diabetes mellitus without complication ([facility])   . Folate deficiency   . Gout   . Hyperlipemia   . Hypertension   . Lung cancer ([facility])    squamous cell carcinoma of right lower lobe lung  . MI (myocardial infarction) ([facility])   . Pulmonary hypertension ([facility])     ALLERGIES:  is allergic to levaquin [levofloxacin in d5w] and lisinopril.  MEDICATIONS:  Current Outpatient Medications  Medication Sig Dispense Refill  . albuterol (PROAIR HFA) 108 (90 BASE) MCG/ACT inhaler INHALE 2 PUFFS BY MOUTH EVERY 4 HOURS AS NEEDED FOR WHEEZING (Patient taking differently: Inhale 2 puffs into the lungs every 4 (four) hours as needed for wheezing. ) 1 Inhaler 11  . allopurinol (ZYLOPRIM) 300 MG tablet Take 300 mg by mouth daily.    Marland Kitchen amLODipine (NORVASC) 10 MG tablet Take 10 mg by mouth daily.    Marland Kitchen atorvastatin (LIPITOR) 20 MG tablet Take 1 tablet (20 mg total) by mouth daily. 30 tablet 0  . bisoprolol (ZEBETA) 5 MG tablet Take 1 tablet (5 mg total) by mouth daily. 30 tablet 0  . BREO ELLIPTA 100-25 MCG/INH AEPB Inhale 1 puff into the lungs 2 (two) times a day.     . Cholecalciferol (VITAMIN D3) 50 MCG (2000 UT) capsule Take 2,000 Units by mouth daily.     . cloNIDine (CATAPRES) 0.1 MG tablet Take 0.1 mg by mouth 2 (two) times a day.    . ferrous sulfate 325 (65 FE) MG tablet Take 1 tablet (325 mg total) by mouth every Monday, Wednesday, and Friday. 30 tablet 0  . folic acid (FOLVITE) 1 MG tablet Take 1 tablet (  1 mg total) by mouth daily. 30 tablet 11  . furosemide (LASIX) 20 MG tablet Take 1 tablet (20 mg total) by mouth every Monday, Wednesday, and Friday. 30 tablet 0  . metFORMIN (GLUCOPHAGE) 1000 MG tablet Take 0.5 tablets (500 mg total) by mouth 2 (two) times daily. 60 tablet 0  . naproxen (NAPROSYN) 500 MG tablet Take 500 mg by mouth 2 (two) times daily as needed for moderate pain.    . pantoprazole (PROTONIX) 40 MG tablet Take 1 tablet (40 mg total) by mouth 2 (two) times daily before a meal. 180 tablet 3   . QUEtiapine (SEROQUEL) 25 MG tablet Take 1 tablet (25 mg total) by mouth 2 (two) times daily. 60 tablet 0  . senna-docusate (SENOKOT-S) 8.6-50 MG tablet Take 1 tablet by mouth at bedtime. 30 tablet 0  . sucralfate (CARAFATE) 1 g tablet Take 1 g by mouth See admin instructions. Four time daily and at bedtime    . Vitamin D, Ergocalciferol, (DRISDOL) 1.25 MG (50000 UT) CAPS capsule Take 50,000 Units by mouth every Friday.      Current Facility-Administered Medications  Medication Dose Route Frequency Provider Last Rate Last Dose  . sodium chloride 0.9 % 1,000 mL with potassium chloride 40 mEq infusion   Intravenous Once Heilingoetter, Cassandra L, PA-C        SURGICAL HISTORY:  Past Surgical History:  Procedure Laterality Date  . APPENDECTOMY    . BIOPSY  04/21/2019   Procedure: BIOPSY;  Surgeon: Gatha Mayer, MD;  Location: [facility];  Service: Endoscopy;;  . ESOPHAGOGASTRODUODENOSCOPY (EGD) WITH PROPOFOL N/A 04/21/2019   Procedure: ESOPHAGOGASTRODUODENOSCOPY (EGD) WITH PROPOFOL;  Surgeon: Gatha Mayer, MD;  Location: [facility];  Service: Endoscopy;  Laterality: N/A;  . ESOPHAGOGASTRODUODENOSCOPY (EGD) WITH PROPOFOL N/A 04/29/2019   Procedure: ESOPHAGOGASTRODUODENOSCOPY (EGD) WITH PROPOFOL;  Surgeon: Mauri [facility], MD;  Location: [facility];  Service: Endoscopy;  Laterality: N/A;  . HEMOSTASIS CLIP PLACEMENT  04/29/2019   Procedure: HEMOSTASIS CLIP PLACEMENT;  Surgeon: Mauri [facility], MD;  Location: [facility];  Service: Endoscopy;;  Clip placed as marker not for bleeding control  . IR ANGIOGRAM SELECTIVE EACH ADDITIONAL VESSEL  04/29/2019  . IR ANGIOGRAM SELECTIVE EACH ADDITIONAL VESSEL  04/29/2019  . IR ANGIOGRAM SELECTIVE EACH ADDITIONAL VESSEL  04/29/2019  . IR ANGIOGRAM VISCERAL SELECTIVE  04/29/2019  . IR EMBO ART  VEN HEMORR LYMPH EXTRAV  INC GUIDE ROADMAPPING  04/29/2019  . IR US GUIDE VASC ACCESS RIGHT  04/29/2019  . LUNG BIOPSY      REVIEW OF SYSTEMS:    Review of Systems  Constitutional: Positive for decreased appetite and weight loss.  Negative for chills, fatigue, and fever. HENT: Negative for mouth sores, nosebleeds, sore throat and trouble swallowing.   Eyes: Negative for eye problems and icterus.  Respiratory: Negative for cough, hemoptysis, shortness of breath and wheezing.   Cardiovascular: Negative for chest pain and leg swelling.  Gastrointestinal: Negative for abdominal pain, constipation, diarrhea, nausea and vomiting.  Genitourinary: Negative for bladder incontinence, difficulty urinating, dysuria, frequency and hematuria.   Musculoskeletal: Negative for back pain, gait problem, neck pain and neck stiffness.  Skin: Negative for itching and rash.  Neurological: Negative for dizziness, extremity weakness, gait problem, headaches, light-headedness and seizures.  Hematological: Negative for adenopathy. Does not bruise/bleed easily.  Psychiatric/Behavioral: Patient has dementia. Negative for confusion, depression and sleep disturbance. The patient is not nervous/anxious.     PHYSICAL EXAMINATION:  Blood pressure (!) 86/51, pulse 70,  temperature 98 F (36.7 C), temperature source Oral, resp. rate 18, height 5\' 9"  (1.753 m), weight 173 lb 14.4 oz (78.9 kg), SpO2 98 %.  ECOG PERFORMANCE STATUS: 1 - Symptomatic but completely ambulatory  Physical Exam  Constitutional: Oriented to person and place and well-developed, well-nourished, and in no distress.  HENT:  Head: Normocephalic and atraumatic.  Mouth/Throat: Oropharynx is clear and moist. No oropharyngeal exudate.  Eyes: Conjunctivae are normal. Right eye exhibits no discharge. Left eye exhibits no discharge. No scleral icterus.  Neck: Normal range of motion. Neck supple.  Cardiovascular: Normal rate, regular rhythm, normal heart sounds and intact distal pulses.   Pulmonary/Chest: Effort normal and breath sounds normal. No respiratory distress. No wheezes. No rales.  Abdominal:  Soft. Bowel sounds are normal. Exhibits no distension and no mass. There is no tenderness.  Musculoskeletal: Normal range of motion. Exhibits no edema.  Lymphadenopathy:    No cervical adenopathy.  Neurological: Alert and oriented to person, place, and time. Exhibits normal muscle tone. Gait normal. Coordination normal.  Skin: Skin is warm and dry. No rash noted. Not diaphoretic. No erythema. No pallor.  Psychiatric: Mood, memory and judgment normal.  Vitals reviewed.  LABORATORY DATA: Lab Results  Component Value Date   WBC 8.5 08/05/2019   HGB 9.5 (L) 08/05/2019   HCT 30.3 (L) 08/05/2019   MCV 97.7 08/05/2019   PLT 228 08/05/2019      Chemistry      Component Value Date/Time   NA 144 08/05/2019 0925   NA 143 10/04/2016 1354   K 2.6 (LL) 08/05/2019 0925   K 3.9 10/04/2016 1354   CL 103 08/05/2019 0925   CO2 28 08/05/2019 0925   CO2 25 10/04/2016 1354   BUN 12 08/05/2019 0925   BUN 11.6 10/04/2016 1354   CREATININE 1.48 (H) 08/05/2019 0925   CREATININE 1.3 10/04/2016 1354      Component Value Date/Time   CALCIUM 9.4 08/05/2019 0925   CALCIUM 9.5 10/04/2016 1354   ALKPHOS 73 08/05/2019 0925   ALKPHOS 119 10/04/2016 1354   AST 8 (L) 08/05/2019 0925   AST 16 10/04/2016 1354   ALT 7 08/05/2019 0925   ALT 14 10/04/2016 1354   BILITOT 0.4 08/05/2019 0925   BILITOT 0.53 10/04/2016 1354       RADIOGRAPHIC STUDIES:  Ct Head Wo Contrast  Result Date: 08/01/2019 CLINICAL DATA:  Fall out of bed EXAM: CT HEAD WITHOUT CONTRAST CT CERVICAL SPINE WITHOUT CONTRAST TECHNIQUE: Multidetector CT imaging of the head and cervical spine was performed following the standard protocol without intravenous contrast. Multiplanar CT image reconstructions of the cervical spine were also generated. COMPARISON:  07/04/2019 FINDINGS: CT HEAD FINDINGS Brain: There is atrophy and chronic small vessel disease changes. No acute intracranial abnormality. Specifically, no hemorrhage, hydrocephalus, mass  lesion, acute infarction, or significant intracranial injury. Vascular: No hyperdense vessel or unexpected calcification. Skull: No acute calvarial abnormality. Sinuses/Orbits: Mucosal thickening within the maxillary sinuses. No air-fluid levels. Other: None CT CERVICAL SPINE FINDINGS Alignment: No subluxation Skull base and vertebrae: No acute fracture. No primary bone lesion or focal pathologic process. Soft tissues and spinal canal: No prevertebral fluid or swelling. No visible canal hematoma. Disc levels: Diffuse degenerative disc disease, most pronounced C4-5 through C6-7. Mild bilateral degenerative facet disease. Upper chest: No acute findings. Other: None IMPRESSION: Atrophy, chronic microvascular disease. No acute intracranial abnormality. Cervical spondylosis. No acute bony abnormality in the cervical spine. Electronically Signed   By: Rolm Baptise M.D.  On: 08/01/2019 03:26   Ct Cervical Spine Wo Contrast  Result Date: 08/01/2019 CLINICAL DATA:  Fall out of bed EXAM: CT HEAD WITHOUT CONTRAST CT CERVICAL SPINE WITHOUT CONTRAST TECHNIQUE: Multidetector CT imaging of the head and cervical spine was performed following the standard protocol without intravenous contrast. Multiplanar CT image reconstructions of the cervical spine were also generated. COMPARISON:  07/04/2019 FINDINGS: CT HEAD FINDINGS Brain: There is atrophy and chronic small vessel disease changes. No acute intracranial abnormality. Specifically, no hemorrhage, hydrocephalus, mass lesion, acute infarction, or significant intracranial injury. Vascular: No hyperdense vessel or unexpected calcification. Skull: No acute calvarial abnormality. Sinuses/Orbits: Mucosal thickening within the maxillary sinuses. No air-fluid levels. Other: None CT CERVICAL SPINE FINDINGS Alignment: No subluxation Skull base and vertebrae: No acute fracture. No primary bone lesion or focal pathologic process. Soft tissues and spinal canal: No prevertebral fluid or  swelling. No visible canal hematoma. Disc levels: Diffuse degenerative disc disease, most pronounced C4-5 through C6-7. Mild bilateral degenerative facet disease. Upper chest: No acute findings. Other: None IMPRESSION: Atrophy, chronic microvascular disease. No acute intracranial abnormality. Cervical spondylosis. No acute bony abnormality in the cervical spine. Electronically Signed   By: Rolm Baptise M.D.   On: 08/01/2019 03:26   US Renal  Result Date: 07/09/2019 CLINICAL DATA:  Acute renal failure. EXAM: RENAL / URINARY TRACT ULTRASOUND COMPLETE COMPARISON:  PET CT 05/26/2019 FINDINGS: Right Kidney: Renal measurements: 11 x 6.6 x 7.3 cm = volume: 179.7 mL. No hydronephrosis. Complex cyst in the mid right kidney measuring 7 x 3.9 x 5.2 cm. Stone on prior PET-CT is not visualized. Left Kidney: Renal measurements: 10 x 6.2 x 5.5 cm = volume: 179 mL. Echogenicity within normal limits. No mass or hydronephrosis visualized. Bladder: Appears normal for degree of bladder distention. IMPRESSION: 1. No obstructive uropathy. 2. Right renal cyst. Right nephrolithiasis on prior PET CT not demonstrated sonographically. Electronically Signed   By: Keith Rake M.D.   On: 07/09/2019 00:00   Ct Biopsy  Result Date: 07/30/2019 CLINICAL DATA:  Hypermetabolic right lower lobe lung mass EXAM: CT GUIDED CORE BIOPSY OF RIGHT LOWER LOBE LUNG MASS ANESTHESIA/SEDATION: Intravenous Fentanyl 73mcg and Versed 1mg  were administered as conscious sedation during continuous monitoring of the patient's level of consciousness and physiological / cardiorespiratory status by the radiology RN, with a total moderate sedation time of 12 minutes. PROCEDURE: The procedure risks, benefits, and alternatives were explained to the patient. Questions regarding the procedure were encouraged and answered. The patient understands and consents to the procedure. Select axial scans through the thorax were obtained. The right lower lobe lesion was  localized and an appropriate skin entry site was determined and marked. The operative field was prepped with chlorhexidinein a sterile fashion, and a sterile drape was applied covering the operative field. A sterile gown and sterile gloves were used for the procedure. Local anesthesia was provided with 1% Lidocaine. Under CT fluoroscopic guidance, a 17 gauge trocar needle was advanced to the margin of the lesion. Once needle tip position was confirmed, coaxial 18-gauge core biopsy samples were obtained, submitted in formalin to surgical pathology. The guide needle was removed. Postprocedure scans show no hemorrhage or pneumothorax. The patient tolerated the procedure well. COMPLICATIONS: None immediate FINDINGS: Right lower lobe pleural-based lung mass was again localized. Representative core biopsy samples obtained as above. IMPRESSION: 1. Technically successful CT-guided core biopsy, right lower lobe lung mass. Electronically Signed   By: Lucrezia Europe M.D.   On: 07/30/2019 13:18   Dg  Chest Port 1 View  Result Date: 07/12/2019 CLINICAL DATA:  Wheezing and shortness of breath. EXAM: PORTABLE CHEST 1 VIEW COMPARISON:  Chest CT April 22nd 2020 FINDINGS: Cardiomediastinal silhouette is normal. Mediastinal contours appear intact. Right pulmonary masses are not significantly changed from the prior studies. Right pleural thickening versus small pleural effusion. Bibasilar atelectasis. Osseous structures are without acute abnormality. Soft tissues are grossly normal. IMPRESSION: 1. Right pulmonary masses are not significantly changed from the prior studies. 2. Right pleural thickening versus small pleural effusion. 3. Bibasilar atelectasis. Electronically Signed   By: Fidela Salisbury M.D.   On: 07/12/2019 09:11   Dg Chest Port 1 View  Result Date: 07/08/2019 CLINICAL DATA:  Shortness of breath and fever. EXAM: PORTABLE CHEST 1 VIEW COMPARISON:  PET-CT May 26, 2019 and chest radiograph April 28, 2019 FINDINGS: The  previously noted right lower lobe mass is again apparent, measuring 5.1 x 3.8 cm. There is no appreciable edema or consolidation. Heart is mildly enlarged with pulmonary vascularity normal. No adenopathy evident. No bone lesions. IMPRESSION: Persistent right lower lobe mass. No new opacity evident. Heart mildly enlarged. No adenopathy demonstrable by radiography. Electronically Signed   By: Lowella Grip III M.D.   On: 07/08/2019 11:33     ASSESSMENT/PLAN:  This is a very pleasant 73 year old African-American male who was initially diagnosed in June 2016 with stage Ia non-small cell lung cancer, squamous cell carcinoma.  He is status post curative radiotherapy with a partial response.  He has been on observation but he was lost to follow-up until May of 2020 when a repeat CT scan showed an enlarging right lower lobe lesion which was hypermetabolic on a PET scan.     The patient recently underwent a CT-guided biopsy of the lesion in the right lower lobe.  He is here today for evaluation and to review his pathology results and treatment options.  Dr. Julien Nordmann had a lengthy discussion today with the patient about his current condition and treatment recommendations. The patient has dementia so his daughter and granddaughter were available by phone during the encounter today.  The patient's pathology report was consistent with recurrent squamous cell carcinoma.   This case was discussed with multidisciplinary thoracic conference this morning. We recommend that the patient be referred to Dr. Tammi Klippel in radiation oncology for evaluation consideration of SBRT to the recurrent right lower lobe lesion. Radiation oncology has been in touch with the patient this morning and are in the process of scheduling an appointment for him.  We will see the patient back for a follow up visit in 4 months for evaluation with a restaging CT scan of the chest.   Regarding the patient's weight loss, the patient was referred  to nutritional services. The patient has an appointment with them on 08/10/2019. Additionally, I discussed with the patient today the importance of good nutrition and to try to increase his calorie intake.   The patient's labs today showed hypokalemia. Additionally, his blood pressure was noted to be 86/51. We will administer 1 L of normal saline with 40 meq of potassium. The patient was also provided a handout on potassium rich foods to incorporate in his diet. The patient was also advised to monitor his blood pressure closely at home. If his blood pressure continues to be low, he was advised to follow up with his PCP to consider adjustment of his hypertension medications.   The patient was given an AVS with a written summary of the plan going forward  from today's visit to share with his family. Additionally, the patient's daughter and granddaughter were available by phone.   The patient was advised to call immediately if he has any concerning symptoms in the interval. The patient voices understanding of current disease status and treatment options and is in agreement with the current care plan. All questions were answered. The patient knows to call the clinic with any problems, questions or concerns. We can certainly see the patient much sooner if necessary   Orders Placed This Encounter  Procedures  . CT Chest Wo Contrast    Standing Status:   Future    Standing Expiration Date:   08/04/2020    Order Specific Question:   ** REASON FOR EXAM (FREE TEXT)    Answer:   Restaging Lung Cancer    Order Specific Question:   Preferred imaging location?    Answer:   [facility]    Order Specific Question:   Radiology Contrast Protocol - do NOT remove file path    Answer:   [email or web address]  . CBC with Differential ([facility] Only)    Standing Status:   Future    Standing Expiration Date:   08/04/2020  . CMP ([facility] only)    Standing Status:   Future     Standing Expiration Date:   08/04/2020     Tobe Sos Heilingoetter, PA-C 08/05/19  ADDENDUM: Hematology/Oncology Attending: I had a face-to-face encounter with the patient today.  I recommended his care plan.  This is a very pleasant 73 years old African-American male with history of a stage Ia non-small cell lung cancer, squamous cell carcinoma diagnosed in June 2016 status post curative radiotherapy with partial response and the patient has been on observation for several years. Repeat imaging studies showed development of right lower lobe lung mass inferior to the previous lesion.  The PET scan showed the mass was hypermetabolic.  The patient underwent CT-guided core biopsy of the new lesion and it was consistent with squamous cell carcinoma. I had a lengthy discussion with the patient and his daughter today about his condition. We recommended for the patient to proceed with curative radiotherapy to the new lesion under the care of Dr. Tammi Klippel. For the hypokalemia, we will arrange for the patient to receive 40 mEq of potassium chloride IV today. We will see the patient back for follow-up visit in 4 months for evaluation with repeat CT scan of the chest for restaging of his disease after the curative radiotherapy. He was advised to call immediately if he has any concerning symptoms in the interval.  Disclaimer: This note was dictated with voice recognition software. Similar sounding words can inadvertently be transcribed and may be missed upon review. Eilleen Kempf, MD 08/05/19

## 2019-08-05 NOTE — Patient Instructions (Addendum)

## 2019-08-05 NOTE — Telephone Encounter (Signed)
Received call from pt's daughter, Antwione Picotte.  She is following up with regards to pt's appt today.  Advised that pt is receiving IVF with [facility] today and will be here an extra 2 hours or so. Granddaughter, April, also aware of this.  Advised that other information regarding his visit today is on his After Visit Summary per Cassie Heilingoetter, PA.  Willette Cluster also informed us that we are only to speak with her or her daughter, April.  She states there is a cousin who has been trying to get involved.  Her name is Arnell Slivinski. She requests we do not speak with her.  Noted the above on Patient Care Coordination Note.

## 2019-08-05 NOTE — Patient Instructions (Signed)
-  He will receive fluid today as well as extra potassium -Be on the look out for a call from radiation oncology to schedule radiation treatment to the spot in his lung. The recent biopsy showed that it is the same type of cancer -We will see him back for a follow up visit in 4 months with a CT scan of the chest. The radiology department will call you to schedule the scan but in case you need their number it is [phone] -I will give him a handout on potassium rich food. Potassium is very important for your heart to work so it is important that his potassium does not drop too low -Follow up on 08/10/2019 with nutritionist, Dory Peru, regarding his recent weight loss.

## 2019-08-06 ENCOUNTER — Telehealth: Payer: Self-pay | Admitting: *Deleted

## 2019-08-06 ENCOUNTER — Telehealth: Payer: Self-pay | Admitting: Internal Medicine

## 2019-08-06 ENCOUNTER — Other Ambulatory Visit: Payer: Self-pay

## 2019-08-06 ENCOUNTER — Ambulatory Visit
Admission: RE | Admit: 2019-08-06 | Discharge: 2019-08-06 | Disposition: A | Discharge status: Home / Self Care | Payer: Medicare Other | Source: Ambulatory Visit | Attending: Radiation Oncology | Admitting: Radiation Oncology

## 2019-08-06 ENCOUNTER — Ambulatory Visit: Payer: Medicare Other | Admitting: Radiation Oncology

## 2019-08-06 ENCOUNTER — Ambulatory Visit: Payer: Medicare Other

## 2019-08-06 DIAGNOSIS — C3431 Malignant neoplasm of lower lobe, right bronchus or lung: Secondary | ICD-10-CM | POA: Diagnosis present

## 2019-08-06 DIAGNOSIS — Z510 Encounter for antineoplastic radiation therapy: Secondary | ICD-10-CM | POA: Insufficient documentation

## 2019-08-06 NOTE — Telephone Encounter (Signed)
Received call from pt's daughter, Willette Cluster.  She is requesting transportation assistance for her father as he starts radiation treatments on Monday, 08/09/19. Vivien Presto that I would send message to our transportation coordinator and have her call. Willette Cluster voiced understanding

## 2019-08-06 NOTE — Progress Notes (Signed)
I spoke with the patient's daughter, John [name], to inform them that Dr. Tammi Klippel had reviewed the results from her father's recent CT-guided biopsy of the RLL lung mass which confirms recurrence of NSCLC, squamous cell carcinoma.  Based on our prior conversations, the recommendation is to proceed with a 2-week course of stereotactic body radiotherapy (SBRT) to the RLL mass delivered over 10 daily treatments.  We discussed the risks and benefits as well as the short and long-term side effects anticipated with this type of treatment.  She was encouraged to ask questions that were answered to her stated satisfaction.  She has discussed this recommendation previously with her father and he is in agreement to proceed.  We will arrange for CT simulation/treatment planning on 08/06/2019 in anticipation of beginning his radiation treatments around 08/11/2019.  They are comfortable and in agreement with the stated plan and knows to call with any questions or concerns in the interim.  Nicholos Johns, MMS, PA-C [facility]: [phone]  Fax: [phone]

## 2019-08-06 NOTE — Telephone Encounter (Signed)
Scheduled appt per 8/06 LOS - mailed letter with appt date and time

## 2019-08-08 NOTE — Progress Notes (Signed)
  Radiation Oncology         [phone] ________________________________  Name: John [name] MRN: [id number]  Date: 08/06/2019  DOB: 1946/07/03   SIMULATION AND TREATMENT PLANNING NOTE    ICD-10-CM   1. 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  Identity was confirmed.  All relevant records and images related to the planned course of therapy were reviewed.  The patient freely provided informed written consent to proceed with treatment after reviewing the details related to the planned course of therapy. The consent form was witnessed and verified by the simulation staff.  Then, the patient was set-up in a stable reproducible  supine position for radiation therapy.  A BodyFix immobilization pillow was fabricated for reproducible positioning.  Then I personally applied the abdominal compression paddle to limit respiratory excursion.  4D respiratoy motion management CT images were obtained.  Surface markings were placed.  The CT images were loaded into the planning software.  Then, using Cine, MIP, and standard views, the internal target volume (ITV) and planning target volumes (PTV) were delinieated, and avoidance structures were contoured.  Treatment planning then occurred.  The radiation prescription was entered and confirmed.  A total of two complex treatment devices were fabricated in the form of the BodyFix immobilization pillow and a neck accuform cushion.  I have requested : 3D Simulation  I have requested a DVH of the following structures: Heart, Lungs, Esophagus, Chest Wall, Brachial Plexus, Major Blood Vessels, and targets.  RESPIRATORY MOTION MANAGEMENT SIMULATION:  In order to account for effect of respiratory motion on target structures and other organs in the planning and delivery of radiotherapy, this patient underwent  respiratory motion management simulation.  To accomplish this, when the patient was brought to the CT simulation planning suite, 4D respiratoy motion management CT images were obtained.  The CT images were loaded into the planning software.  Then, using a variety of tools including Cine, MIP, and standard views, the target volume and planning target volumes (PTV) were delineated.  Avoidance structures were contoured.  Treatment planning then occurred.  Dose volume histograms were generated and reviewed for each of the requested structure.  The resulting plan was carefully reviewed and approved today.  PLAN:  The patient will receive 50 Gy in 10 fractions.  ________________________________  Sheral Apley Tammi Klippel, M.D.

## 2019-08-09 ENCOUNTER — Other Ambulatory Visit: Payer: Self-pay

## 2019-08-09 ENCOUNTER — Inpatient Hospital Stay (HOSPITAL_COMMUNITY)
Admission: EM | Admit: 2019-08-09 | Discharge: 2019-08-11 | DRG: 071 | Disposition: A | Discharge status: Skilled Nursing Facility | Payer: Medicare Other | Attending: Internal Medicine | Admitting: Internal Medicine

## 2019-08-09 ENCOUNTER — Encounter (HOSPITAL_COMMUNITY): Payer: Self-pay | Admitting: Emergency Medicine

## 2019-08-09 ENCOUNTER — Ambulatory Visit: Payer: Medicare Other

## 2019-08-09 ENCOUNTER — Ambulatory Visit: Payer: Medicare Other | Admitting: Radiation Oncology

## 2019-08-09 ENCOUNTER — Emergency Department (HOSPITAL_COMMUNITY): Payer: Medicare Other

## 2019-08-09 DIAGNOSIS — E785 Hyperlipidemia, unspecified: Secondary | ICD-10-CM | POA: Diagnosis present

## 2019-08-09 DIAGNOSIS — Z85118 Personal history of other malignant neoplasm of bronchus and lung: Secondary | ICD-10-CM

## 2019-08-09 DIAGNOSIS — I10 Essential (primary) hypertension: Secondary | ICD-10-CM | POA: Diagnosis present

## 2019-08-09 DIAGNOSIS — Z7984 Long term (current) use of oral hypoglycemic drugs: Secondary | ICD-10-CM

## 2019-08-09 DIAGNOSIS — E1122 Type 2 diabetes mellitus with diabetic chronic kidney disease: Secondary | ICD-10-CM | POA: Diagnosis present

## 2019-08-09 DIAGNOSIS — I2510 Atherosclerotic heart disease of native coronary artery without angina pectoris: Secondary | ICD-10-CM | POA: Diagnosis present

## 2019-08-09 DIAGNOSIS — Z923 Personal history of irradiation: Secondary | ICD-10-CM

## 2019-08-09 DIAGNOSIS — N183 Chronic kidney disease, stage 3 unspecified: Secondary | ICD-10-CM | POA: Diagnosis present

## 2019-08-09 DIAGNOSIS — Z87891 Personal history of nicotine dependence: Secondary | ICD-10-CM

## 2019-08-09 DIAGNOSIS — I130 Hypertensive heart and chronic kidney disease with heart failure and stage 1 through stage 4 chronic kidney disease, or unspecified chronic kidney disease: Secondary | ICD-10-CM | POA: Diagnosis present

## 2019-08-09 DIAGNOSIS — G9341 Metabolic encephalopathy: Secondary | ICD-10-CM | POA: Diagnosis not present

## 2019-08-09 DIAGNOSIS — I959 Hypotension, unspecified: Secondary | ICD-10-CM | POA: Diagnosis not present

## 2019-08-09 DIAGNOSIS — E1151 Type 2 diabetes mellitus with diabetic peripheral angiopathy without gangrene: Secondary | ICD-10-CM | POA: Diagnosis not present

## 2019-08-09 DIAGNOSIS — E876 Hypokalemia: Secondary | ICD-10-CM | POA: Diagnosis present

## 2019-08-09 DIAGNOSIS — F0390 Unspecified dementia without behavioral disturbance: Secondary | ICD-10-CM | POA: Diagnosis present

## 2019-08-09 DIAGNOSIS — Z791 Long term (current) use of non-steroidal anti-inflammatories (NSAID): Secondary | ICD-10-CM

## 2019-08-09 DIAGNOSIS — C3431 Malignant neoplasm of lower lobe, right bronchus or lung: Secondary | ICD-10-CM | POA: Diagnosis present

## 2019-08-09 DIAGNOSIS — Z8711 Personal history of peptic ulcer disease: Secondary | ICD-10-CM

## 2019-08-09 DIAGNOSIS — Z888 Allergy status to other drugs, medicaments and biological substances status: Secondary | ICD-10-CM

## 2019-08-09 DIAGNOSIS — I252 Old myocardial infarction: Secondary | ICD-10-CM

## 2019-08-09 DIAGNOSIS — I5032 Chronic diastolic (congestive) heart failure: Secondary | ICD-10-CM | POA: Diagnosis not present

## 2019-08-09 DIAGNOSIS — Z79899 Other long term (current) drug therapy: Secondary | ICD-10-CM

## 2019-08-09 DIAGNOSIS — Z8249 Family history of ischemic heart disease and other diseases of the circulatory system: Secondary | ICD-10-CM

## 2019-08-09 DIAGNOSIS — D638 Anemia in other chronic diseases classified elsewhere: Secondary | ICD-10-CM | POA: Diagnosis present

## 2019-08-09 DIAGNOSIS — I272 Pulmonary hypertension, unspecified: Secondary | ICD-10-CM | POA: Diagnosis present

## 2019-08-09 DIAGNOSIS — Z9981 Dependence on supplemental oxygen: Secondary | ICD-10-CM

## 2019-08-09 DIAGNOSIS — N179 Acute kidney failure, unspecified: Secondary | ICD-10-CM | POA: Diagnosis present

## 2019-08-09 DIAGNOSIS — J9611 Chronic respiratory failure with hypoxia: Secondary | ICD-10-CM | POA: Diagnosis not present

## 2019-08-09 DIAGNOSIS — I951 Orthostatic hypotension: Secondary | ICD-10-CM | POA: Diagnosis present

## 2019-08-09 DIAGNOSIS — M109 Gout, unspecified: Secondary | ICD-10-CM | POA: Diagnosis present

## 2019-08-09 DIAGNOSIS — Z881 Allergy status to other antibiotic agents status: Secondary | ICD-10-CM

## 2019-08-09 DIAGNOSIS — Z20828 Contact with and (suspected) exposure to other viral communicable diseases: Secondary | ICD-10-CM | POA: Diagnosis present

## 2019-08-09 DIAGNOSIS — E538 Deficiency of other specified B group vitamins: Secondary | ICD-10-CM | POA: Diagnosis present

## 2019-08-09 DIAGNOSIS — E860 Dehydration: Secondary | ICD-10-CM | POA: Diagnosis present

## 2019-08-09 DIAGNOSIS — Z9119 Patient's noncompliance with other medical treatment and regimen: Secondary | ICD-10-CM

## 2019-08-09 DIAGNOSIS — D649 Anemia, unspecified: Secondary | ICD-10-CM | POA: Diagnosis present

## 2019-08-09 DIAGNOSIS — J449 Chronic obstructive pulmonary disease, unspecified: Secondary | ICD-10-CM | POA: Diagnosis present

## 2019-08-09 LAB — CBC WITH DIFFERENTIAL/PLATELET
Abs Immature Granulocytes: 0.04 10*3/uL (ref 0.00–0.07)
Basophils Absolute: 0 10*3/uL (ref 0.0–0.1)
Basophils Relative: 0 %
Eosinophils Absolute: 0.2 10*3/uL (ref 0.0–0.5)
Eosinophils Relative: 2 %
HCT: 27.4 % — ABNORMAL LOW (ref 39.0–52.0)
Hemoglobin: 8.4 g/dL — ABNORMAL LOW (ref 13.0–17.0)
Immature Granulocytes: 1 %
Lymphocytes Relative: 14 %
Lymphs Abs: 1 10*3/uL (ref 0.7–4.0)
MCH: 30.4 pg (ref 26.0–34.0)
MCHC: 30.7 g/dL (ref 30.0–36.0)
MCV: 99.3 fL (ref 80.0–100.0)
Monocytes Absolute: 0.6 10*3/uL (ref 0.1–1.0)
Monocytes Relative: 9 %
Neutro Abs: 5.3 10*3/uL (ref 1.7–7.7)
Neutrophils Relative %: 74 %
Platelets: 153 10*3/uL (ref 150–400)
RBC: 2.76 MIL/uL — ABNORMAL LOW (ref 4.22–5.81)
RDW: 16.3 % — ABNORMAL HIGH (ref 11.5–15.5)
WBC: 7.2 10*3/uL (ref 4.0–10.5)
nRBC: 0 % (ref 0.0–0.2)

## 2019-08-09 LAB — BASIC METABOLIC PANEL
Anion gap: 11 (ref 5–15)
BUN: 15 mg/dL (ref 8–23)
CO2: 28 mmol/L (ref 22–32)
Calcium: 8.7 mg/dL — ABNORMAL LOW (ref 8.9–10.3)
Chloride: 103 mmol/L (ref 98–111)
Creatinine, Ser: 1.52 mg/dL — ABNORMAL HIGH (ref 0.61–1.24)
GFR calc Af Amer: 52 mL/min — ABNORMAL LOW (ref >60)
GFR calc non Af Amer: 44 mL/min — ABNORMAL LOW (ref >60)
Glucose, Bld: 91 mg/dL (ref 70–99)
Potassium: 2.5 mmol/L — CL (ref 3.5–5.1)
Sodium: 142 mmol/L (ref 135–145)

## 2019-08-09 LAB — TROPONIN I (HIGH SENSITIVITY): Troponin I (High Sensitivity): 5 ng/L (ref <18)

## 2019-08-09 MED ORDER — POTASSIUM CHLORIDE 10 MEQ/100ML IV SOLN
10.0000 meq | INTRAVENOUS | Status: AC
  Administered 2019-08-09 – 2019-08-10 (×4): 10 meq via INTRAVENOUS
  Filled 2019-08-09 (×4): qty 100

## 2019-08-09 MED ORDER — POTASSIUM CHLORIDE IN NACL 20-0.9 MEQ/L-% IV SOLN
Freq: Once | INTRAVENOUS | Status: AC
  Administered 2019-08-09: 23:00:00 via INTRAVENOUS
  Filled 2019-08-09: qty 1000

## 2019-08-09 NOTE — TOC Initial Note (Signed)
Transition of [facility]) - Initial/Assessment Note    Patient Details  Name: John [name] MRN: [id number] Date of Birth: [birth year]  [facility] CM/SW Contact:    Claudine Mouton, LCSW Phone Number: 08/09/2019, 10:13 PM  Clinical Narrative:   CSW spoke to pt's daughter who stated they wanted placement for the pt so he can regain his strength, regain his appetite, and begin using his oxygen and begin taking his medications regularly keeping his his hygiene up (not taking a shower).  Per the pt's daughter [place] arrived to provide them with a new oxygen tank, but the rep didn't know what "he was talking about".  He usually doesn't follow direction and the police had to make the pt get into the ambulance in the past, but today the ambulance crew had been able to convince the pt to transport to the ED via EMS.  CSW spoke with pt's daughter Willette Cluster at  [phone] and confirmed pt's daughter's  Plan for the pt to be discharged to SNF to for rehab at discharge.  CSW provided active listening and validated pt's daughter's concerns that pt NOT BE SENT TO [facility] SNF, as pt's daughter states she used to work there.   CSW was given permission to complete FL-2 and send referrals out to SNF facilities via the hub per pt's daughter's request.  Pt has been living with her daughter and grandaughter, prior to being admitted to [facility].  Pt's daughter provided permission for pt's referrals be sent to all [place] facilities but voiced understanding pt may not be appropriate for placement and that [employer] may not authorize a SNF stay even if referrlas are sent out.  Expected Discharge Plan: Skilled Nursing Facility Barriers to Discharge: Continued Medical Work up   Patient Goals and CMS Choice Patient states their goals for this hospitalization and ongoing recovery are:: To stabilize and return home strengthened.      Expected Discharge Plan and Services Expected  Discharge Plan: [place] In-house Referral: Clinical Social Work                                           Prior Living Arrangements/Services   Lives with:: Adult Children   Do you feel safe going back to the place where you live?: No(Per family)      Need for Family Participation in Patient Care: Yes (Comment)        Activities of Daily Living      Permission Sought/Granted Permission sought to share information with : Facility Art therapist granted to share information with : Yes, Verbal Permission Granted              Emotional Assessment       Orientation: : Fluctuating Orientation (Suspected and/or reported [place])      Admission diagnosis:  Orthostatic Change Patient Active Problem List   Diagnosis Date Noted  . Goals of care, counseling/discussion 08/05/2019  . Acute renal failure (ARF) ([place]) 07/08/2019  . Hypokalemia 07/08/2019  . Vascular dementia without behavioral disturbance ([place])   . Helicobacter pylori gastritis   . Chronic duodenal ulcer with hemorrhage   . Multiple gastric ulcers   . Acute esophagitis   . Gastrointestinal hemorrhage 04/20/2019  . Vitamin B12 deficiency 04/20/2019  . Folate deficiency 04/20/2019  . Dehydration 04/20/2019  . Hypotension 04/20/2019  . Melena   .  Acute blood loss anemia   . CKD (chronic kidney disease), stage III ([place]) 04/08/2019  . Right lower lobe lung mass 04/08/2019  . Syncope and collapse 04/08/2019  . Symptomatic anemia 04/08/2019  . Occult GI bleeding 04/08/2019  . Hyperglycemia 04/08/2019  . Leukocytosis 04/08/2019  . Syncope 04/08/2019  . Low hemoglobin   . Dyspnea on exertion 08/25/2017  . Bursitis of elbow 03/15/2016  . Obesity (BMI 30-39.9) 12/03/2015  . Primary cancer of right lower lobe of lung ([place]) 06/14/2015  . Solitary pulmonary nodule 03/13/2015  . RVF (right ventricular failure) ([place]) 11/04/2014  . COPD II/III with reversibility   12/19/2012  . Chronic respiratory failure with hypoxia ([place]) 12/18/2012  . Drug-induced hyperglycemia 12/08/2012  . Allergic drug rash 12/06/2012  . Pulmonary HTN ([place]) 03/18/2012  . Abnormal echocardiogram 03/18/2012  . LV dysfunction 03/18/2012  . COPD exacerbation ([place]) 02/22/2012  . Chronic diastolic CHF (congestive heart failure) ([place]) 02/22/2012  . HTN (hypertension) 02/22/2012  . CAD (coronary artery disease) 02/22/2012  . Acute on chronic systolic heart failure ([place]) 02/22/2012   PCP:  [name], NP Pharmacy:   [place], [place] - [address] [place] [address] Phone: [phone] Fax: [phone]     Social Determinants of Health (SDOH) Interventions    Readmission Risk Interventions Readmission Risk Prevention Plan 07/09/2019 04/30/2019 04/30/2019  Transportation Screening Complete - Complete  Medication Review (RN Care Manager) Complete - Complete  PCP or Specialist appointment within 3-5 days of discharge - Complete -  [place] or Home Care Consult Complete - Complete  SW Recovery Care/Counseling Consult Complete - -  Palliative Care Screening Not Applicable - Not [place] Not Applicable - Not Applicable  Some recent data might be hidden

## 2019-08-09 NOTE — Progress Notes (Signed)
Consult request has been received. CSW attempting to follow up at present time.  Per EPD, pt's family is requesting assistance for LTC placement saying they "can't care for the pt anymore".  CSW called pt's daughter Willette Cluster at ph: [phone] and updated her.  Marylou Flesher, LCSW, [place] Social Worker [phone]

## 2019-08-09 NOTE — H&P (Addendum)
John [name] [id number] DOB: 1946-02-27 DOA: 08/09/2019     PCP: [name], NP   Outpatient Specialists:     Pulmonary    Dr. Melvyn Novas 2018  Oncology   Dr. Julien Nordmann GI Dr.Gessner  ( [facility])    Patient arrived to ER on 08/09/19 at 1906  Patient coming from: home Lives   With family    Chief Complaint:  Chief Complaint  Patient presents with  . Hypotension    HPI: John [name] is a 73 y.o. 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 intake He has significant dementia at baseline his family having difficulty taking care of him at home Admission was in July for acute renal failure  Patient's family is concerned that he keeps getting admitted to the hospital and then discharged home and then they unable to take care of him at home as he has been noncompliant with both oxygen and medication has history of vascular dementia Patient is pleasant at this time but not able to provide detailed history he is unsure why he is here states he does have oxygen at home and he uses it.  Infectious risk factors:  Reports none  In  ER RAPID COVID TEST  in house testing    Pending     Regarding pertinent Chronic problems:    Hyperlipidemia -  on statins lipitor   HTN on NOrvasc, Zebeta, clonidine   CHF diastolic -   Lasix 3 times a week    History of duodenal ulcer last endoscopy was in  May 2020   DM 2 -  Lab Results  Component Value Date   HGBA1C 5.8 (H) 04/08/2019   on  metformin       COPD - not followed by pulmonology currently   on baseline oxygen 2 L, not compliant      CKD stage III - baseline Cr 1.2   While in ER: Social work has been consulted for placement K 2.6  The following Work up has been ordered so far:  Orders Placed This Encounter  Procedures  . SARS CORONAVIRUS 2 Nasal Swab Aptima Multi Swab  . DG Chest Portable 1 View  . CBC with Differential  . Basic metabolic panel  . Vitamin B12  . Folate  . Iron and TIBC  . Ferritin  . Reticulocytes  . Urinalysis, Routine w reflex microscopic  . Cardiac monitoring  . Cardiac monitoring  . Consult to social work  . Consult to hospitalist  ALL PATIENTS BEING ADMITTED/HAVING PROCEDURES NEED COVID-19 SCREENING  . Patient needs PT consult as soon as possible.  Discharge is pending PT consult.  Please call  PT eval and treat  . POC occult blood, ED  . ED EKG  . Type and screen [place]  . Saline lock IV  . Place in observation (patient's expected  length of stay will be less than 2 midnights)    Following Medications were ordered in ER: Medications  potassium chloride 10 mEq in 100 mL IVPB (10 mEq Intravenous New Bag/Given 08/09/19 2320)  0.9 % NaCl with KCl 20 mEq/ L  infusion ( Intravenous New Bag/Given 08/09/19 2322)        Consult Orders  (From admission, onward)         Start     Ordered   08/09/19 2300  Patient needs PT consult as soon as possible.  Discharge is pending PT consult.  Please call  PT eval and treat  Imminent discharge    Comments: Patient needs PT consult as soon as possible.  Discharge is pending PT consult.  Please call   08/09/19 2259   08/09/19 2202  Consult to hospitalist  ALL PATIENTS BEING ADMITTED/HAVING PROCEDURES NEED COVID-19 SCREENING  Once    Comments: ALL PATIENTS BEING ADMITTED/HAVING PROCEDURES NEED COVID-19 SCREENING  Provider:   (Not yet assigned)  Question Answer Comment  Place call to: [facility]   Reason for Consult Admit   Diagnosis/Clinical Info for Consult: hypokalemia      08/09/19 2203   08/09/19 2044  Consult to social work  Once    Provider:  (Not yet assigned)  Question Answer Comment  Place call to: family requesting placement in facility [phone]   Reason for Consult Consult      08/09/19 2044           Significant initial  Findings: Abnormal Labs Reviewed  CBC WITH DIFFERENTIAL/PLATELET - Abnormal; Notable for the following components:      Result Value   RBC 2.76 (*)    Hemoglobin 8.4 (*)    HCT 27.4 (*)    RDW 16.3 (*)    All other components within normal limits  BASIC METABOLIC PANEL - Abnormal; Notable for the following components:   Potassium 2.5 (*)    Creatinine, Ser 1.52 (*)    Calcium 8.7 (*)    GFR calc non Af Amer 44 (*)    GFR calc Af Amer 52 (*)    All other components within normal limits     Otherwise labs showing:    Recent Labs  Lab 08/05/19 0925 08/09/19 2011  NA 144 142  K 2.6* 2.5*  CO2 28 28  GLUCOSE 125* 91  BUN 12 15  CREATININE 1.48* 1.52*  CALCIUM 9.4 8.7*    Cr   Stable,  Lab Results  Component Value Date   CREATININE 1.52 (H) 08/09/2019   CREATININE 1.48 (H) 08/05/2019   CREATININE 1.92 (H) 07/30/2019    Recent Labs  Lab 08/05/19 0925  AST 8*  ALT 7  ALKPHOS 73  BILITOT 0.4  PROT 6.7  ALBUMIN 3.2*   Lab Results  Component Value Date   CALCIUM 8.7 (L) 08/09/2019   PHOS 3.6 04/28/2019      WBC      Component Value Date/Time   WBC 7.2 08/09/2019 2011   ANC    Component Value Date/Time   NEUTROABS 5.3 08/09/2019 2011   NEUTROABS 5.1 10/04/2016 1354   ALC No results found for: LYMPHOABS    Plt: Lab Results  Component Value Date   PLT 153 08/09/2019   COVID-19 Labs  No results for input(s): DDIMER, FERRITIN, LDH, CRP in the last 72 hours.  Lab Results  Component Value Date   SARSCOV2NAA  NEGATIVE 07/27/2019   SARSCOV2NAA NEGATIVE 07/08/2019   SARSCOV2NAA NEGATIVE  07/05/2019   [place] NEGATIVE 04/28/2019    HG/HCT Down   from baseline see below    Component Value Date/Time   HGB 8.4 (L) 08/09/2019 2011   HGB 9.5 (L) 08/05/2019 0925   HGB 13.2 10/04/2016 1354   HCT 27.4 (L) 08/09/2019 2011   HCT 41.0 10/04/2016 1354     BNP (last 3 results) Recent Labs    04/08/19 0310 04/28/19 1529  BNP 61.0 49.5    ProBNP (last 3 results) No results for input(s): PROBNP in the last 8760 hours.  DM  labs:  HbA1C: Recent Labs    04/08/19 0518  HGBA1C 5.8*     CBG (last 3)  No results for input(s): GLUCAP in the last 72 hours.     UA   ordered     CXR -  NON acute known mass lesion    ECG:  Personally reviewed by me showing: HR : 71 Rhythm: NSR,     no evidence of ischemic changes, ?U WAVE QTC 411      ED Triage Vitals  Enc Vitals Group     BP 08/09/19 1930 113/65     Pulse Rate 08/09/19 1930 73     Resp 08/09/19 1930 16     Temp 08/09/19 1930 98.9 F (37.2 C)     Temp Source 08/09/19 1930 Oral     SpO2 08/09/19 1926 100 %     Weight 08/09/19 1931 175 lb (79.4 kg)     Height 08/09/19 1931 5\' 9"  (1.753 m)     Head Circumference --      Peak Flow --      Pain Score 08/09/19 1931 0     Pain Loc --      Pain Edu? --      Excl. in [place]? --   TMAX(24)@       Latest  Blood pressure 117/65, pulse 64, temperature 98.9 F (37.2 C), temperature source Oral, resp. rate 16, height 5\' 9"  (1.753 m), weight 79.4 kg, SpO2 96 %.     Hospitalist was called for admission for hypokalemia   Review of Systems:    Pertinent positives include: fatigue,   Constitutional:  No weight loss, night sweats, Fevers, chills, weight loss  HEENT:  No headaches, Difficulty swallowing,Tooth/dental problems,Sore throat,  No sneezing, itching, ear ache, nasal congestion, post nasal drip,  Cardio-vascular:  No chest pain, Orthopnea, PND, anasarca, dizziness,  palpitations.no Bilateral lower extremity swelling  GI:  No heartburn, indigestion, abdominal pain, nausea, vomiting, diarrhea, change in bowel habits, loss of appetite, melena, blood in stool, hematemesis Resp:  no shortness of breath at rest. No dyspnea on exertion, No excess mucus, no productive cough, No non-productive cough, No coughing up of blood.No change in color of mucus.No wheezing. Skin:  no rash or lesions. No jaundice GU:  no dysuria, change in color of urine, no urgency or frequency. No straining to urinate.  No flank pain.  Musculoskeletal:  No joint pain or no joint swelling. No decreased range of motion. No back pain.  Psych:  No change in mood or affect. No depression or anxiety. No memory loss.  Neuro: no localizing neurological complaints, no tingling, no weakness, no double vision, no gait abnormality, no slurred speech, no confusion  All systems reviewed and apart from [place] all are negative  Past Medical History:   Past Medical History:  Diagnosis Date  . B12 deficiency   . CHF (congestive heart failure) ([place])   .  COPD (chronic obstructive pulmonary disease) ([place])   . Diabetes mellitus without complication ([place])   . Folate deficiency   . Gout   . Hyperlipemia   . Hypertension   . 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 MI (myocardial infarction) ([place])   . Pulmonary hypertension ([place])       Past Surgical History:  Procedure Laterality Date  . APPENDECTOMY    . BIOPSY  04/21/2019   Procedure: BIOPSY;  Surgeon: Gatha Mayer, MD;  Location: [facility];  Service: Endoscopy;;  . ESOPHAGOGASTRODUODENOSCOPY (EGD) WITH PROPOFOL N/A 04/21/2019   Procedure: ESOPHAGOGASTRODUODENOSCOPY (EGD) WITH PROPOFOL;  Surgeon: Gatha Mayer, MD;  Location: [facility];  Service: Endoscopy;  Laterality: N/A;  . ESOPHAGOGASTRODUODENOSCOPY (EGD) WITH PROPOFOL N/A 04/29/2019   Procedure: ESOPHAGOGASTRODUODENOSCOPY (EGD) WITH PROPOFOL;   Surgeon: Mauri Pole, MD;  Location: [facility];  Service: Endoscopy;  Laterality: N/A;  . HEMOSTASIS CLIP PLACEMENT  04/29/2019   Procedure: HEMOSTASIS CLIP PLACEMENT;  Surgeon: Mauri Pole, MD;  Location: [facility];  Service: Endoscopy;;  Clip placed as marker not for bleeding control  . IR ANGIOGRAM SELECTIVE EACH ADDITIONAL VESSEL  04/29/2019  . IR ANGIOGRAM SELECTIVE EACH ADDITIONAL VESSEL  04/29/2019  . IR ANGIOGRAM SELECTIVE EACH ADDITIONAL VESSEL  04/29/2019  . IR ANGIOGRAM VISCERAL SELECTIVE  04/29/2019  . IR EMBO ART  VEN HEMORR LYMPH EXTRAV  INC GUIDE ROADMAPPING  04/29/2019  . IR US GUIDE VASC ACCESS RIGHT  04/29/2019  . LUNG BIOPSY      Social History:     reports that he quit smoking about 6 years ago. His smoking use included cigarettes. He has a 90.00 pack-year smoking history. He has never used smokeless tobacco. He reports that he does not drink alcohol or use drugs.     Family History:    Family History  Problem Relation Age of Onset  . Heart disease Mother   . Cancer Neg Hx     Allergies: Allergies  Allergen Reactions  . Levaquin [Levofloxacin In D5w] Other (See Comments)    Unknown rxn  . Lisinopril Swelling     Prior to Admission medications   Medication Sig Start Date End Date Taking? Authorizing Provider  albuterol (PROAIR HFA) 108 (90 BASE) MCG/ACT inhaler INHALE 2 PUFFS BY MOUTH EVERY 4 HOURS AS NEEDED FOR WHEEZING Patient taking differently: Inhale 2 puffs into the lungs every 4 (four) hours as needed for wheezing.  03/13/15   Tanda Rockers, MD  allopurinol (ZYLOPRIM) 300 MG tablet Take 300 mg by mouth daily. 04/07/19   [provider]  amLODipine (NORVASC) 10 MG tablet Take 10 mg by mouth daily. 05/07/19   [provider]  atorvastatin (LIPITOR) 20 MG tablet Take 1 tablet (20 mg total) by mouth daily. 09/26/16   Tanda Rockers, MD  bisoprolol (ZEBETA) 5 MG tablet Take 1 tablet (5 mg total) by mouth daily. 09/26/16    Tanda Rockers, MD  BREO ELLIPTA 100-25 MCG/INH AEPB Inhale 1 puff into the lungs 2 (two) times a day.  05/07/19   [provider]  Cholecalciferol (VITAMIN D3) 50 MCG (2000 UT) capsule Take 2,000 Units by mouth daily.  05/07/19   [provider]  cloNIDine (CATAPRES) 0.1 MG tablet Take 0.1 mg by mouth 2 (two) times a day. 07/20/19   [provider]  ferrous sulfate 325 (65 FE) MG tablet Take 1 tablet (325 mg total) by mouth  every Monday, Wednesday, and Friday. 05/03/19 08/01/19  Florencia Reasons, MD  folic acid (FOLVITE) 1 MG tablet Take 1 tablet (1 mg total) by mouth daily. 04/09/19 04/08/20  Donne Hazel, MD  furosemide (LASIX) 20 MG tablet Take 1 tablet (20 mg total) by mouth every Monday, Wednesday, and Friday. 05/03/19   Florencia Reasons, MD  metFORMIN (GLUCOPHAGE) 1000 MG tablet Take 0.5 tablets (500 mg total) by mouth 2 (two) times daily. 05/01/19   Florencia Reasons, MD  naproxen (NAPROSYN) 500 MG tablet Take 500 mg by mouth 2 (two) times daily as needed for moderate pain.    [provider]  pantoprazole (PROTONIX) 40 MG tablet Take 1 tablet (40 mg total) by mouth 2 (two) times daily before a meal. 05/28/19   Nandigam, Venia Minks, MD  QUEtiapine (SEROQUEL) 25 MG tablet Take 1 tablet (25 mg total) by mouth 2 (two) times daily. 07/15/19   Kayleen Memos, DO  senna-docusate (SENOKOT-S) 8.6-50 MG tablet Take 1 tablet by mouth at bedtime. 05/01/19   Florencia Reasons, MD  sucralfate (CARAFATE) 1 g tablet Take 1 g by mouth See admin instructions. Four time daily and at bedtime 06/23/19   [provider]  Vitamin D, Ergocalciferol, (DRISDOL) 1.25 MG (50000 UT) CAPS capsule Take 50,000 Units by mouth every Friday.  03/10/19   [provider]   Physical Exam: Blood pressure 117/65, pulse 64, temperature 98.9 F (37.2 C), temperature source Oral, resp. rate 16, height 5\' 9"  (1.753 m), weight 79.4 kg, SpO2 96 %. 1. General:  in No  Acute distress   Chronically ill  -appearing 2. Psychological:  Alert and  Oriented 3. Head/ENT:     Dry Mucous Membranes                          Head Non traumatic, neck supple                        Poor Dentition 4. SKIN:    decreased Skin turgor,  Skin clean Dry and intact no rash 5. Heart: Regular rate and rhythm no Murmur, no Rub or gallop 6. Lungs:   no wheezes or crackles   7. Abdomen: Soft,   non-tender, Non distended  obese  bowel sounds present 8. Lower extremities: no clubbing, cyanosis, no  edema 9. Neurologically Grossly intact, moving all 4 extremities equally   10. MSK: Normal range of motion   All other LABS:     Recent Labs  Lab 08/05/19 0925 08/09/19 2011  WBC 8.5 7.2  NEUTROABS 6.4 5.3  HGB 9.5* 8.4*  HCT 30.3* 27.4*  MCV 97.7 99.3  PLT 228 153     Recent Labs  Lab 08/05/19 0925 08/09/19 2011  NA 144 142  K 2.6* 2.5*  CL 103 103  CO2 28 28  GLUCOSE 125* 91  BUN 12 15  CREATININE 1.48* 1.52*  CALCIUM 9.4 8.7*     Recent Labs  Lab 08/05/19 0925  AST 8*  ALT 7  ALKPHOS 73  BILITOT 0.4  PROT 6.7  ALBUMIN 3.2*       Cultures:    Component Value Date/Time   SDES BLOOD LEFT HAND 07/10/2019 1817   SPECREQUEST  07/10/2019 1817    BOTTLES DRAWN AEROBIC AND ANAEROBIC Blood Culture results may not be optimal due to an inadequate volume of blood received in culture bottles   CULT  07/10/2019 1817  NO GROWTH 5 DAYS Performed at [facility], [place] [address] [place], [place] [address]    REPTSTATUS 07/15/2019 FINAL 07/10/2019 1817     Radiological Exams on Admission: Dg Chest Portable 1 View  Result Date: 08/09/2019 CLINICAL DATA:  Short of breath. Needle biopsy right lung mass 07/30/2019. EXAM: PORTABLE CHEST 1 VIEW COMPARISON:  CT chest 04/21/2019, chest x-ray 07/12/2019 FINDINGS: Cardiac enlargement without heart failure. Improved aeration in the lung bases bilaterally. No residual infiltrate Right lower lobe mass lesion again identified.  No pneumothorax. IMPRESSION: Right lower lobe mass  lesion. Interval clearing of bibasilar airspace disease since the prior chest x-ray. Electronically Signed   By: Franchot Gallo M.D.   On: 08/09/2019 19:53    Chart has been reviewed    Assessment/Plan   73 y.o. 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 Hypokalemia - - will replace and repeat in AM,  check magnesium level and replace as needed  . Chronic diastolic CHF (congestive heart failure) (HCC) chronic currently appears to be euvolemic we will continue to monitor, given history of orthostatic hypotension and hypokalemia we will hold Lasix for tonight  . HTN (hypertension) -chronic stable given hypotension will hold off on home medications for tonight . Chronic respiratory failure with hypoxia (HCC) -patient states he has oxygen at home unclear if he is noncompliant will need further discussion with family  . COPD II/III with reversibility chronic stable . Primary cancer of right lower lobe of lung ([place]) stable continue to follow-up with primary oncologist at the time of discharge . CKD (chronic kidney disease), stage III (HCC) chronic continue to monitor avoid nephrotoxic medications . Symptomatic anemia -patient has longstanding history of gastric/duodenal ulcers with probably slow GI bleed Hemoccult stool and obtain anemia panel if continues to drift down may need blood transfusion prior to discharge  Dm 2-  - Order Sensitive  SSI     -  check TSH and HgA1C  - Hold by mouth medications    Other plan as per orders.  DVT prophylaxis:  SCD     Code Status:  FULL CODE  as per patient   I had personally discussed CODE STATUS with patient    Family Communication:   Family not at  Bedside    Disposition Plan:      likely will need placement for rehabilitation                               Would benefit from PT/OT eval prior to DC  Ordered                      Nutrition    consulted                                     Consults called: emailed oncology that pt has been admitted  Admission status:  ED Disposition    ED Disposition Condition [place]: [place] [100102]  Level of Care: Telemetry [5]  Admit to tele based on following criteria: Other see comments  Comments: hypokalemia  Covid Evaluation: Asymptomatic Screening Protocol (No Symptoms)  Diagnosis: Hypokalemia [254270]  Admitting Physician: Toy Baker [[id number]]  Attending Physician: Toy Baker [[id number]]  PT Class (Do Not Modify): Observation [104]  PT Acc Code (Do Not Modify): Observation [10022]        Obs    Level of care     tele    Precautions:  Droplet,     PPE: Used by the provider:   P100  eye Goggles,  Gloves     Sokha Craker 08/09/2019, 11:56 PM    [facility]     after 2 AM please page floor coverage PA If 7AM-7PM, please contact the day team taking care of the patient using [email or web address]

## 2019-08-09 NOTE — ED Provider Notes (Signed)
[facility] DEPT Provider Note   CSN: [id number] Arrival date & time: 08/09/19  1906     History   Chief Complaint Chief Complaint  Patient presents with  . Hypotension    HPI HARKIRAT OROZCO is a 73 y.o. male. hypertension, hyperlipidemia, myocardial infarction, congestive heart failure, COPD.  Presents to the ER after family concerned about patient's increased generalized weakness.  EMS also reported concern for orthostatic hypotension.  At time of my interview patient had no acute complaints.  Denies nausea, vomiting, abdominal pain, chest pain or difficulty breathing.  Per chart review, patient diagnosed right lower lobe lung cancer.  Currently undergoing radiation therapy.  Additional history obtained from patient's daughter.  History limited due to patient's dementia, at mental baseline.  Patient is able to provide some basic questions.     HPI  Past Medical History:  Diagnosis Date  . B12 deficiency   . CHF (congestive heart failure) ([place])   . COPD (chronic obstructive pulmonary disease) ([place])   . Diabetes mellitus without complication ([place])   . Folate deficiency   . Gout   . Hyperlipemia   . Hypertension   . 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 MI (myocardial infarction) ([place])   . Pulmonary hypertension [facility])     Patient Active Problem List   Diagnosis Date Noted  . Goals of care, counseling/discussion 08/05/2019  . Acute renal failure (ARF) ([place]) 07/08/2019  . Hypokalemia 07/08/2019  . Vascular dementia without behavioral disturbance ([place])   . Helicobacter pylori gastritis   . Chronic duodenal ulcer with hemorrhage   . Multiple gastric ulcers   . Acute esophagitis   . Gastrointestinal hemorrhage 04/20/2019  . Vitamin B12 deficiency 04/20/2019  . Folate deficiency 04/20/2019  . Dehydration 04/20/2019  . Hypotension 04/20/2019  . Melena   . Acute blood loss anemia   . CKD (chronic  kidney disease), stage III ([place]) 04/08/2019  . Right lower lobe lung mass 04/08/2019  . Syncope and collapse 04/08/2019  . Symptomatic anemia 04/08/2019  . Occult GI bleeding 04/08/2019  . Hyperglycemia 04/08/2019  . Leukocytosis 04/08/2019  . Syncope 04/08/2019  . Low hemoglobin   . Dyspnea on exertion 08/25/2017  . Bursitis of elbow 03/15/2016  . Obesity (BMI 30-39.9) 12/03/2015  . Primary cancer of right lower lobe of lung ([place]) 06/14/2015  . Solitary pulmonary nodule 03/13/2015  . RVF (right ventricular failure) ([place]) 11/04/2014  . COPD II/III with reversibility  12/19/2012  . Chronic respiratory failure with hypoxia ([place]) 12/18/2012  . Drug-induced hyperglycemia 12/08/2012  . Allergic drug rash 12/06/2012  . Pulmonary HTN ([place]) 03/18/2012  . Abnormal echocardiogram 03/18/2012  . LV dysfunction 03/18/2012  . COPD exacerbation ([place]) 02/22/2012  . Chronic diastolic CHF (congestive heart failure) ([place]) 02/22/2012  . HTN (hypertension) 02/22/2012  . CAD (coronary artery disease) 02/22/2012  . Acute on chronic systolic heart failure ([place]) 02/22/2012    Past Surgical History:  Procedure Laterality Date  . APPENDECTOMY    . BIOPSY  04/21/2019   Procedure: BIOPSY;  Surgeon: Gatha Mayer, MD;  Location: [facility];  Service: Endoscopy;;  . ESOPHAGOGASTRODUODENOSCOPY (EGD) WITH PROPOFOL N/A 04/21/2019   Procedure: ESOPHAGOGASTRODUODENOSCOPY (EGD) WITH PROPOFOL;  Surgeon: Gatha Mayer, MD;  Location: [facility];  Service: Endoscopy;  Laterality: N/A;  . ESOPHAGOGASTRODUODENOSCOPY (EGD) WITH PROPOFOL N/A 04/29/2019   Procedure: ESOPHAGOGASTRODUODENOSCOPY (EGD) WITH PROPOFOL;  Surgeon: Mauri Pole, MD;  Location: [facility]  ENDOSCOPY;  Service: Endoscopy;  Laterality: N/A;  . HEMOSTASIS CLIP PLACEMENT  04/29/2019   Procedure: HEMOSTASIS CLIP PLACEMENT;  Surgeon: Mauri Pole, MD;  Location: [facility] END[facility]COPY;  Service: Endoscopy;;  Clip placed as marker not for bleeding control   . IR ANGIOGRAM SELECTIVE EACH ADDITIONAL VESSEL  04/29/2019  . IR ANGIOGRAM SELECTIVE EACH ADDITIONAL VESSEL  04/29/2019  . IR ANGIOGRAM SELECTIVE EACH ADDITIONAL VESSEL  04/29/2019  . IR ANGIOGRAM VISCERAL SELECTIVE  04/29/2019  . IR EMBO ART  VEN HEMORR LYMPH EXTRAV  INC GUIDE ROADMAPPING  04/29/2019  . IR US GUIDE VASC ACCESS RIGHT  04/29/2019  . LUNG BIOPSY          Home Medications    Prior to Admission medications   Medication Sig Start Date End Date Taking? Authorizing Provider  albuterol (PROAIR HFA) 108 (90 BASE) MCG/ACT inhaler INHALE 2 PUFFS BY MOUTH EVERY 4 HOURS AS NEEDED FOR WHEEZING Patient taking differently: Inhale 2 puffs into the lungs every 4 (four) hours as needed for wheezing.  03/13/15   Tanda Rockers, MD  allopurinol (ZYLOPRIM) 300 MG tablet Take 300 mg by mouth daily. 04/07/19   [provider]  amLODipine (NORVASC) 10 MG tablet Take 10 mg by mouth daily. 05/07/19   [provider]  atorvastatin (LIPITOR) 20 MG tablet Take 1 tablet (20 mg total) by mouth daily. 09/26/16   Tanda Rockers, MD  bisoprolol (ZEBETA) 5 MG tablet Take 1 tablet (5 mg total) by mouth daily. 09/26/16   Tanda Rockers, MD  BREO ELLIPTA 100-25 MCG/INH AEPB Inhale 1 puff into the lungs 2 (two) times a day.  05/07/19   [provider]  Cholecalciferol (VITAMIN D3) 50 MCG (2000 UT) capsule Take 2,000 Units by mouth daily.  05/07/19   [provider]  cloNIDine (CATAPRES) 0.1 MG tablet Take 0.1 mg by mouth 2 (two) times a day. 07/20/19   [provider]  ferrous sulfate 325 (65 FE) MG tablet Take 1 tablet (325 mg total) by mouth every Monday, Wednesday, and Friday. 05/03/19 08/01/19  Florencia Reasons, MD  folic acid (FOLVITE) 1 MG tablet Take 1 tablet (1 mg total) by mouth daily. 04/09/19 04/08/20  Donne Hazel, MD  furosemide (LASIX) 20 MG tablet Take 1 tablet (20 mg total) by mouth every Monday, Wednesday, and Friday. 05/03/19   Florencia Reasons, MD  metFORMIN (GLUCOPHAGE) 1000 MG  tablet Take 0.5 tablets (500 mg total) by mouth 2 (two) times daily. 05/01/19   Florencia Reasons, MD  naproxen (NAPROSYN) 500 MG tablet Take 500 mg by mouth 2 (two) times daily as needed for moderate pain.    [provider]  pantoprazole (PROTONIX) 40 MG tablet Take 1 tablet (40 mg total) by mouth 2 (two) times daily before a meal. 05/28/19   Nandigam, Venia Minks, MD  QUEtiapine (SEROQUEL) 25 MG tablet Take 1 tablet (25 mg total) by mouth 2 (two) times daily. 07/15/19   Kayleen Memos, DO  senna-docusate (SENOKOT-S) 8.6-50 MG tablet Take 1 tablet by mouth at bedtime. 05/01/19   Florencia Reasons, MD  sucralfate (CARAFATE) 1 g tablet Take 1 g by mouth See admin instructions. Four time daily and at bedtime 06/23/19   [provider]  Vitamin D, Ergocalciferol, (DRISDOL) 1.25 MG (50000 UT) CAPS capsule Take 50,000 Units by mouth every Friday.  03/10/19   [provider]    Family History Family History  Problem Relation Age of Onset  . Heart disease  Mother   . Cancer Neg Hx     Social History Social History   Tobacco Use  . Smoking status: Former Smoker    Packs/day: 1.50    Years: 60.00    Pack years: 90.00    Types: Cigarettes    Quit date: 09/29/2012    Years since quitting: 6.8  . Smokeless tobacco: Never Used  Substance Use Topics  . Alcohol use: No    Alcohol/week: 0.0 standard drinks  . Drug use: No     Allergies   Levaquin [levofloxacin in d5w] and Lisinopril   Review of Systems Review of Systems  Constitutional: Negative for chills and fever.  HENT: Negative for ear pain and sore throat.   Eyes: Negative for pain and visual disturbance.  Respiratory: Negative for cough and shortness of breath.   Cardiovascular: Negative for chest pain and palpitations.  Gastrointestinal: Negative for abdominal pain and vomiting.  Genitourinary: Negative for dysuria and hematuria.  Musculoskeletal: Negative for arthralgias and back pain.  Skin: Negative for color change and  rash.  Neurological: Negative for seizures and syncope.  All other systems reviewed and are negative.    Physical Exam Updated Vital Signs SpO2 100%   Physical Exam Vitals signs and nursing note reviewed.  Constitutional:      Appearance: He is well-developed.  HENT:     Head: Normocephalic and atraumatic.  Eyes:     Conjunctiva/sclera: Conjunctivae normal.  Neck:     Musculoskeletal: Neck supple.  Cardiovascular:     Rate and Rhythm: Normal rate and regular rhythm.     Heart sounds: No murmur.  Pulmonary:     Effort: Pulmonary effort is normal. No respiratory distress.     Breath sounds: Normal breath sounds.  Abdominal:     Palpations: Abdomen is soft.     Tenderness: There is no abdominal tenderness.  Skin:    General: Skin is warm and dry.  Neurological:     General: No focal deficit present.     Mental Status: He is alert.     Comments: Oriented to person but not place or time      ED Treatments / Results  Labs (all labs ordered are listed, but only abnormal results are displayed) Labs Reviewed - No data to display  EKG None  Radiology No results found.  Procedures Procedures (including critical care time)  Medications Ordered in ED Medications - No data to display   Initial Impression / Assessment and Plan / ED Course  I have reviewed the triage vital signs and the nursing notes.  Pertinent labs & imaging results that were available during my care of the patient were reviewed by me and considered in my medical decision making (see chart for details).        73 year old male with history of lung cancer, dementia presents to the ER with generalized weakness.  Labs concerning for significant hypokalemia.  No EKG changes.  Diagnosed with similar at outpatient visit and had received replacement previously.  Given persistent hypokalemia, will admit for further management.  Started IV replacement.  Final Clinical Impressions(s) / ED Diagnoses    Final diagnoses:  Hypokalemia    ED Discharge Orders    None       Lucrezia Starch, MD 08/09/19 2340

## 2019-08-09 NOTE — ED Triage Notes (Signed)
Pt BIBA from home.   Per EMS- Family called- reporting pt is non compliant with home O2.  Pt was orthostatically hypotensive with EMS.    Lying-150/70 - HR 80; sitting-108/70; standing 80/58 HR-100  Pt denies weakness, CP, n/v.  Pt able to ambulate to stretcher (appx 20 ft).  Family reports hx of hypotension over last few weeks, poor PO intake for same amt of time. Hx of dementia at baseline, AO to baseline.

## 2019-08-09 NOTE — Progress Notes (Signed)
CSW completed assessment and began request for PASRR on [place] [facility]. The "screen is still running".  CSW on duty on 08/10/2019 please reach out to the [facility] ED CSW during the day at phone [phone] to seek the requests of [place] MUST.  CSW will continue to follow for D/C needs.  Alphonse Guild. Orma Cheetham, LCSW, LCAS, CSI Transitions of Care Clinical Social Worker Care Coordination Department Ph: [phone]

## 2019-08-10 ENCOUNTER — Other Ambulatory Visit: Payer: Self-pay

## 2019-08-10 ENCOUNTER — Inpatient Hospital Stay: Payer: Medicare Other | Admitting: Nutrition

## 2019-08-10 ENCOUNTER — Ambulatory Visit: Payer: Medicare Other

## 2019-08-10 DIAGNOSIS — I252 Old myocardial infarction: Secondary | ICD-10-CM | POA: Diagnosis not present

## 2019-08-10 DIAGNOSIS — G9341 Metabolic encephalopathy: Principal | ICD-10-CM

## 2019-08-10 DIAGNOSIS — E860 Dehydration: Secondary | ICD-10-CM | POA: Diagnosis present

## 2019-08-10 DIAGNOSIS — I272 Pulmonary hypertension, unspecified: Secondary | ICD-10-CM | POA: Diagnosis present

## 2019-08-10 DIAGNOSIS — J449 Chronic obstructive pulmonary disease, unspecified: Secondary | ICD-10-CM | POA: Diagnosis present

## 2019-08-10 DIAGNOSIS — C3431 Malignant neoplasm of lower lobe, right bronchus or lung: Secondary | ICD-10-CM | POA: Diagnosis present

## 2019-08-10 DIAGNOSIS — I951 Orthostatic hypotension: Secondary | ICD-10-CM

## 2019-08-10 DIAGNOSIS — J9611 Chronic respiratory failure with hypoxia: Secondary | ICD-10-CM | POA: Diagnosis not present

## 2019-08-10 DIAGNOSIS — Z20828 Contact with and (suspected) exposure to other viral communicable diseases: Secondary | ICD-10-CM | POA: Diagnosis present

## 2019-08-10 DIAGNOSIS — I2510 Atherosclerotic heart disease of native coronary artery without angina pectoris: Secondary | ICD-10-CM | POA: Diagnosis present

## 2019-08-10 DIAGNOSIS — I959 Hypotension, unspecified: Secondary | ICD-10-CM | POA: Diagnosis present

## 2019-08-10 DIAGNOSIS — E876 Hypokalemia: Secondary | ICD-10-CM | POA: Diagnosis present

## 2019-08-10 DIAGNOSIS — N179 Acute kidney failure, unspecified: Secondary | ICD-10-CM

## 2019-08-10 DIAGNOSIS — M109 Gout, unspecified: Secondary | ICD-10-CM | POA: Diagnosis present

## 2019-08-10 DIAGNOSIS — E1122 Type 2 diabetes mellitus with diabetic chronic kidney disease: Secondary | ICD-10-CM | POA: Diagnosis present

## 2019-08-10 DIAGNOSIS — E785 Hyperlipidemia, unspecified: Secondary | ICD-10-CM | POA: Diagnosis present

## 2019-08-10 DIAGNOSIS — N183 Chronic kidney disease, stage 3 (moderate): Secondary | ICD-10-CM | POA: Diagnosis present

## 2019-08-10 DIAGNOSIS — D638 Anemia in other chronic diseases classified elsewhere: Secondary | ICD-10-CM | POA: Diagnosis present

## 2019-08-10 DIAGNOSIS — E1151 Type 2 diabetes mellitus with diabetic peripheral angiopathy without gangrene: Secondary | ICD-10-CM | POA: Diagnosis not present

## 2019-08-10 DIAGNOSIS — F0390 Unspecified dementia without behavioral disturbance: Secondary | ICD-10-CM | POA: Diagnosis present

## 2019-08-10 DIAGNOSIS — E538 Deficiency of other specified B group vitamins: Secondary | ICD-10-CM | POA: Diagnosis present

## 2019-08-10 DIAGNOSIS — I5032 Chronic diastolic (congestive) heart failure: Secondary | ICD-10-CM | POA: Diagnosis present

## 2019-08-10 DIAGNOSIS — I130 Hypertensive heart and chronic kidney disease with heart failure and stage 1 through stage 4 chronic kidney disease, or unspecified chronic kidney disease: Secondary | ICD-10-CM | POA: Diagnosis present

## 2019-08-10 LAB — SARS CORONAVIRUS 2 (TAT 6-24 HRS): SARS Coronavirus 2: NEGATIVE

## 2019-08-10 LAB — CBC
HCT: 29 % — ABNORMAL LOW (ref 39.0–52.0)
Hemoglobin: 9 g/dL — ABNORMAL LOW (ref 13.0–17.0)
MCH: 30.8 pg (ref 26.0–34.0)
MCHC: 31 g/dL (ref 30.0–36.0)
MCV: 99.3 fL (ref 80.0–100.0)
Platelets: 172 10*3/uL (ref 150–400)
RBC: 2.92 MIL/uL — ABNORMAL LOW (ref 4.22–5.81)
RDW: 16.3 % — ABNORMAL HIGH (ref 11.5–15.5)
WBC: 8 10*3/uL (ref 4.0–10.5)
nRBC: 0 % (ref 0.0–0.2)

## 2019-08-10 LAB — TYPE AND SCREEN
ABO/RH(D): O POS
Antibody Screen: NEGATIVE

## 2019-08-10 LAB — VITAMIN B12: Vitamin B-12: 183 pg/mL (ref 180–914)

## 2019-08-10 LAB — IRON AND TIBC
Iron: 41 ug/dL — ABNORMAL LOW (ref 45–182)
Saturation Ratios: 15 % — ABNORMAL LOW (ref 17.9–39.5)
TIBC: 266 ug/dL (ref 250–450)
UIBC: 225 ug/dL

## 2019-08-10 LAB — BASIC METABOLIC PANEL
Anion gap: 10 (ref 5–15)
BUN: 12 mg/dL (ref 8–23)
CO2: 28 mmol/L (ref 22–32)
Calcium: 9.3 mg/dL (ref 8.9–10.3)
Chloride: 104 mmol/L (ref 98–111)
Creatinine, Ser: 1.18 mg/dL (ref 0.61–1.24)
GFR calc Af Amer: >60 mL/min (ref >60)
GFR calc non Af Amer: >60 mL/min (ref >60)
Glucose, Bld: 116 mg/dL — ABNORMAL HIGH (ref 70–99)
Potassium: 3.2 mmol/L — ABNORMAL LOW (ref 3.5–5.1)
Sodium: 142 mmol/L (ref 135–145)

## 2019-08-10 LAB — POC OCCULT BLOOD, ED: Fecal Occult Bld: NEGATIVE

## 2019-08-10 LAB — URINALYSIS, ROUTINE W REFLEX MICROSCOPIC
Bacteria, UA: NONE SEEN
Bilirubin Urine: NEGATIVE
Glucose, UA: NEGATIVE mg/dL
Hgb urine dipstick: NEGATIVE
Ketones, ur: NEGATIVE mg/dL
Leukocytes,Ua: NEGATIVE
Nitrite: NEGATIVE
Protein, ur: NEGATIVE mg/dL
Specific Gravity, Urine: 1.015 (ref 1.005–1.030)
pH: 5 (ref 5.0–8.0)

## 2019-08-10 LAB — RETICULOCYTES
Immature Retic Fract: 13.3 % (ref 2.3–15.9)
RBC.: 2.95 MIL/uL — ABNORMAL LOW (ref 4.22–5.81)
Retic Count, Absolute: 54.9 10*3/uL (ref 19.0–186.0)
Retic Ct Pct: 1.9 % (ref 0.4–3.1)

## 2019-08-10 LAB — FOLATE: Folate: 6.9 ng/mL (ref >5.9)

## 2019-08-10 LAB — MAGNESIUM
Magnesium: 1 mg/dL — ABNORMAL LOW (ref 1.7–2.4)
Magnesium: 1.5 mg/dL — ABNORMAL LOW (ref 1.7–2.4)

## 2019-08-10 LAB — GLUCOSE, CAPILLARY
Glucose-Capillary: 117 mg/dL — ABNORMAL HIGH (ref 70–99)
Glucose-Capillary: 102 mg/dL — ABNORMAL HIGH (ref 70–99)

## 2019-08-10 LAB — PHOSPHORUS: Phosphorus: 2.1 mg/dL — ABNORMAL LOW (ref 2.5–4.6)

## 2019-08-10 LAB — CBG MONITORING, ED
Glucose-Capillary: 109 mg/dL — ABNORMAL HIGH (ref 70–99)
Glucose-Capillary: 99 mg/dL (ref 70–99)

## 2019-08-10 LAB — ABO/RH: ABO/RH(D): O POS

## 2019-08-10 LAB — HEMOGLOBIN A1C
Hgb A1c MFr Bld: 4.6 % — ABNORMAL LOW (ref 4.8–5.6)
Mean Plasma Glucose: 85.32 mg/dL

## 2019-08-10 LAB — TROPONIN I (HIGH SENSITIVITY): Troponin I (High Sensitivity): 5 ng/L (ref <18)

## 2019-08-10 LAB — TSH: TSH: 1.06 u[IU]/mL (ref 0.350–4.500)

## 2019-08-10 LAB — FERRITIN: Ferritin: 42 ng/mL (ref 24–336)

## 2019-08-10 MED ORDER — ATORVASTATIN CALCIUM 20 MG PO TABS
20.0000 mg | ORAL_TABLET | Freq: Every day | ORAL | Status: DC
  Administered 2019-08-10 – 2019-08-11 (×2): 20 mg via ORAL
  Filled 2019-08-10 (×2): qty 1

## 2019-08-10 MED ORDER — FOLIC ACID 1 MG PO TABS
1.0000 mg | ORAL_TABLET | Freq: Every day | ORAL | Status: DC
  Administered 2019-08-10 – 2019-08-11 (×2): 1 mg via ORAL
  Filled 2019-08-10 (×2): qty 1

## 2019-08-10 MED ORDER — MOMETASONE FURO-FORMOTEROL FUM 200-5 MCG/ACT IN AERO
2.0000 | INHALATION_SPRAY | Freq: Two times a day (BID) | RESPIRATORY_TRACT | Status: DC
  Administered 2019-08-11: 2 via RESPIRATORY_TRACT
  Filled 2019-08-10 (×2): qty 8.8

## 2019-08-10 MED ORDER — SUCRALFATE 1 G PO TABS
1.0000 g | ORAL_TABLET | Freq: Three times a day (TID) | ORAL | Status: DC
  Administered 2019-08-10 – 2019-08-11 (×3): 1 g via ORAL
  Filled 2019-08-10 (×3): qty 1

## 2019-08-10 MED ORDER — POTASSIUM CHLORIDE IN NACL 20-0.9 MEQ/L-% IV SOLN
Freq: Once | INTRAVENOUS | Status: AC
  Administered 2019-08-10: 10:00:00 via INTRAVENOUS
  Filled 2019-08-10: qty 1000

## 2019-08-10 MED ORDER — MAGNESIUM SULFATE 2 GM/50ML IV SOLN
2.0000 g | Freq: Once | INTRAVENOUS | Status: AC
  Administered 2019-08-10: 2 g via INTRAVENOUS
  Filled 2019-08-10: qty 50

## 2019-08-10 MED ORDER — HYDROCODONE-ACETAMINOPHEN 5-325 MG PO TABS: 1.0000 | ORAL_TABLET | ORAL | Status: DC | PRN

## 2019-08-10 MED ORDER — ACETAMINOPHEN 650 MG RE SUPP: 650.0000 mg | Freq: Four times a day (QID) | RECTAL | Status: DC | PRN

## 2019-08-10 MED ORDER — ONDANSETRON HCL 4 MG/2ML IJ SOLN: 4.0000 mg | Freq: Four times a day (QID) | INTRAMUSCULAR | Status: DC | PRN

## 2019-08-10 MED ORDER — PANTOPRAZOLE SODIUM 40 MG PO TBEC
40.0000 mg | DELAYED_RELEASE_TABLET | Freq: Two times a day (BID) | ORAL | Status: DC
  Administered 2019-08-10 – 2019-08-11 (×3): 40 mg via ORAL
  Filled 2019-08-10 (×3): qty 1

## 2019-08-10 MED ORDER — INSULIN ASPART 100 UNIT/ML ~~LOC~~ SOLN
0.0000 [IU] | Freq: Three times a day (TID) | SUBCUTANEOUS | Status: DC
  Administered 2019-08-11: 1 [IU] via SUBCUTANEOUS
  Filled 2019-08-10: qty 0.09

## 2019-08-10 MED ORDER — VITAMIN B-12 1000 MCG PO TABS
1000.0000 ug | ORAL_TABLET | Freq: Every day | ORAL | Status: DC
  Administered 2019-08-10 – 2019-08-11 (×2): 1000 ug via ORAL
  Filled 2019-08-10 (×2): qty 1

## 2019-08-10 MED ORDER — ADULT MULTIVITAMIN W/MINERALS CH
1.0000 | ORAL_TABLET | Freq: Every day | ORAL | Status: DC
  Administered 2019-08-10 – 2019-08-11 (×2): 1 via ORAL
  Filled 2019-08-10 (×2): qty 1

## 2019-08-10 MED ORDER — INSULIN ASPART 100 UNIT/ML ~~LOC~~ SOLN
0.0000 [IU] | Freq: Every day | SUBCUTANEOUS | Status: DC
  Filled 2019-08-10: qty 0.05

## 2019-08-10 MED ORDER — ACETAMINOPHEN 325 MG PO TABS
650.0000 mg | ORAL_TABLET | Freq: Four times a day (QID) | ORAL | Status: DC | PRN
  Administered 2019-08-10: 650 mg via ORAL
  Filled 2019-08-10: qty 2

## 2019-08-10 MED ORDER — POTASSIUM CHLORIDE CRYS ER 20 MEQ PO TBCR
40.0000 meq | EXTENDED_RELEASE_TABLET | ORAL | Status: AC
  Administered 2019-08-10 (×2): 40 meq via ORAL
  Filled 2019-08-10 (×2): qty 2

## 2019-08-10 MED ORDER — SUCRALFATE 1 G PO TABS
1.0000 g | ORAL_TABLET | ORAL | Status: DC
  Filled 2019-08-10: qty 1

## 2019-08-10 MED ORDER — FERROUS SULFATE 325 (65 FE) MG PO TABS
325.0000 mg | ORAL_TABLET | ORAL | Status: DC
  Administered 2019-08-11: 325 mg via ORAL
  Filled 2019-08-10: qty 1

## 2019-08-10 MED ORDER — SODIUM CHLORIDE 0.9 % IV SOLN
INTRAVENOUS | Status: DC
  Administered 2019-08-10: 06:00:00 via INTRAVENOUS

## 2019-08-10 MED ORDER — ALBUTEROL SULFATE (2.5 MG/3ML) 0.083% IN NEBU: 2.5000 mg | INHALATION_SOLUTION | RESPIRATORY_TRACT | Status: DC | PRN

## 2019-08-10 MED ORDER — QUETIAPINE FUMARATE 25 MG PO TABS
25.0000 mg | ORAL_TABLET | Freq: Two times a day (BID) | ORAL | Status: DC
  Administered 2019-08-10 – 2019-08-11 (×4): 25 mg via ORAL
  Filled 2019-08-10 (×4): qty 1

## 2019-08-10 MED ORDER — ALBUTEROL SULFATE HFA 108 (90 BASE) MCG/ACT IN AERS
2.0000 | INHALATION_SPRAY | RESPIRATORY_TRACT | Status: DC | PRN
  Filled 2019-08-10: qty 6.7

## 2019-08-10 MED ORDER — POTASSIUM CHLORIDE CRYS ER 20 MEQ PO TBCR
20.0000 meq | EXTENDED_RELEASE_TABLET | Freq: Once | ORAL | Status: AC
  Administered 2019-08-10: 20 meq via ORAL
  Filled 2019-08-10: qty 1

## 2019-08-10 MED ORDER — ONDANSETRON HCL 4 MG PO TABS: 4.0000 mg | ORAL_TABLET | Freq: Four times a day (QID) | ORAL | Status: DC | PRN

## 2019-08-10 MED ORDER — ENOXAPARIN SODIUM 40 MG/0.4ML ~~LOC~~ SOLN
40.0000 mg | SUBCUTANEOUS | Status: DC
  Administered 2019-08-10: 40 mg via SUBCUTANEOUS
  Filled 2019-08-10: qty 0.4

## 2019-08-10 NOTE — Evaluation (Signed)
Physical Therapy Evaluation Patient Details Name: John [name] MRN: [id number] DOB: 1946-08-10 Today's Date: 08/10/2019   History of Present Illness  73 yo male admitted with hypokalemia, hypotension, noncompliance with wearing O2. Hx of dementia, DM, CHF, lung ca, MI, syncope, pulm HTN  Clinical Impression  On eval in ED, pt was Supervision-Mod Independent with mobility. He walked ~150 feet with a RW. He denied lightheadedness during session. End of session vitals: BP 127/61, HR 71 bpm, O2 91% on RA. No family present during session. Per chart, family is requesting SNF placement due to being unable to care for pt. Will plan to see one more time to assess consistency with mobility and cognition. On today, he performed well. He followed 1 step commands without difficulty. He was pleasant and cooperative.     Follow Up Recommendations SNF(family requesting placement)    Equipment Recommendations  None recommended by PT    Recommendations for Other Services       Precautions / Restrictions Precautions Precautions: Fall Restrictions Weight Bearing Restrictions: No      Mobility  Bed Mobility Overal bed mobility: Modified Independent                Transfers Overall transfer level: Needs assistance Equipment used: Rolling walker (2 wheeled) Transfers: Sit to/from Stand Sit to Stand: Modified independent (Device/Increase time)            Ambulation/Gait Ambulation/Gait assistance: Supervision Gait Distance (Feet): 150 Feet Assistive device: Rolling walker (2 wheeled) Gait Pattern/deviations: Step-through pattern     General Gait Details: for safety. No LOB with RW use. Pt denied lightheadedness.  Stairs            Wheelchair Mobility    Modified Rankin (Stroke Patients Only)       Balance Overall balance assessment: Mild deficits observed, not formally tested                                           Pertinent Vitals/Pain  Pain Assessment: No/denies pain    Home Living Family/patient expects to be discharged to:: Private residence Living Arrangements: Children;Other relatives Available Help at Discharge: Family;Available 24 hours/day Type of Home: House Home Access: Stairs to enter Entrance Stairs-Rails: None Entrance Stairs-Number of Steps: 1 Home Layout: One level Home Equipment: Walker - 2 wheels;Cane - single point      Prior Function           Comments: has both walker and cane but doesn't use them mostly. He stated if he goes into community he will take one with him     Hand Dominance        Extremity/Trunk Assessment   Upper Extremity Assessment Upper Extremity Assessment: Overall WFL for tasks assessed    Lower Extremity Assessment Lower Extremity Assessment: Generalized weakness    Cervical / Trunk Assessment Cervical / Trunk Assessment: Normal  Communication   Communication: No difficulties  Cognition Arousal/Alertness: Awake/alert Behavior During Therapy: WFL for tasks assessed/performed Overall Cognitive Status: Within Functional Limits for tasks assessed                                        General Comments      Exercises     Assessment/Plan    PT Assessment Patient needs continued PT  services  PT Problem List         PT Treatment Interventions Gait training;Therapeutic activities;Therapeutic exercise;Patient/family education;Balance training;Functional mobility training;DME instruction    PT Goals (Current goals can be found in the Care Plan section)  Acute Rehab PT Goals Patient Stated Goal: home PT Goal Formulation: With patient Time For Goal Achievement: 08/24/19 Potential to Achieve Goals: Good    Frequency Min 2X/week   Barriers to discharge Decreased caregiver support(family stating they cant care for pt)      Co-evaluation               AM-PAC PT "6 Clicks" Mobility  Outcome Measure Help needed turning from your  back to your side while in a flat bed without using bedrails?: None Help needed moving from lying on your back to sitting on the side of a flat bed without using bedrails?: None Help needed moving to and from a bed to a chair (including a wheelchair)?: None Help needed standing up from a chair using your arms (e.g., wheelchair or bedside chair)?: None Help needed to walk in hospital room?: A Little Help needed climbing 3-5 steps with a railing? : A Little 6 Click Score: 22    End of Session Equipment Utilized During Treatment: Gait belt Activity Tolerance: Patient tolerated treatment well Patient left: in bed;with call bell/phone within reach   PT Visit Diagnosis: Unsteadiness on feet (R26.81)    Time: 0981-1914 PT Time Calculation (min) (ACUTE ONLY): 17 min   Charges:   PT Evaluation $PT Eval Moderate Complexity: [place], PT Acute Rehabilitation Services Pager: [phone] Office: [phone]

## 2019-08-10 NOTE — ED Notes (Signed)
ED TO INPATIENT HANDOFF REPORT  Name/Age/Gender John [name] 73 y.o. male  Code Status    Code Status Orders  (From admission, onward)         Start     Ordered   08/10/19 0339  Full code  Continuous     08/10/19 0339        Code Status History    Date Active Date Inactive Code Status Order ID Comments User Context   07/08/2019 1647 07/16/2019 1832 Full Code [id number]  Karmen Bongo, MD Inpatient   04/28/2019 1932 05/01/2019 1535 Full Code [id number]  Vianne Bulls, MD ED   04/20/2019 0332 04/24/2019 1541 Full Code [id number]  Vianne Bulls, MD ED   04/08/2019 0505 04/09/2019 1546 Full Code [id number]  Vianne Bulls, MD ED   06/05/2015 1306 06/06/2015 0320 Full Code [id number]  Sandi Mariscal, MD HOV   11/28/2012 1830 11/30/2012 1732 Full Code [id number]  Rise Patience, RN Inpatient   02/22/2012 0641 02/27/2012 1900 Full Code [id number]  Theotis Barrio, RN ED   Advance Care Planning Activity      Home/SNF/Other Home  Chief Complaint Orthostatic Change  Level of Care/Admitting Diagnosis ED Disposition    ED Disposition Condition [place] [facility] Area: [facility] [[id number]]  Level of Care: Med-Surg [16]  Covid Evaluation: Confirmed COVID Negative  Diagnosis: Acute metabolic encephalopathy [[id number]]  Admitting Physician: Mercy Riding [[id number]]  Attending Physician: Mercy Riding [id number]  Estimated length of stay: past midnight tomorrow  Certification:: I certify this patient will need inpatient services for at least 2 midnights  PT Class (Do Not Modify): Inpatient [101]  PT Acc Code (Do Not Modify): Private [1]       Medical History Past Medical History:  Diagnosis Date  . B12 deficiency   . CHF (congestive heart failure) ([place])   . COPD (chronic obstructive pulmonary disease) ([place])   . Diabetes mellitus without complication ([place])   . Folate deficiency   . Gout   . Hyperlipemia   . Hypertension   . 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 MI (myocardial infarction) ([place])   . Pulmonary hypertension ([facility])     Allergies Allergies  Allergen Reactions  . Levaquin [Levofloxacin In D5w] Other (See Comments)    Unknown rxn  . Lisinopril Swelling    IV Location/Drains/Wounds Patient Lines/Drains/Airways Status   Active Line/Drains/Airways    Name:   Placement date:   Placement time:   Site:   Days:   Peripheral IV 08/09/19 Right Antecubital   08/09/19    2014    Antecubital   1   Wound / Incision (Open or Dehisced) 07/30/19 Puncture Flank Right;Upper   07/30/19    1145    Flank   11          Labs/Imaging Results for orders placed or performed during the hospital encounter of 08/09/19 (from the past 48 hour(s))  CBC with Differential     Status: Abnormal   Collection Time: 08/09/19  8:11 PM  Result Value Ref Range   WBC 7.2 4.0 - 10.5 K/uL   RBC 2.76 (L) 4.22 - 5.81 MIL/uL   Hemoglobin 8.4 (L) 13.0 - 17.0 g/dL   HCT 27.4 (L) 39.0 - 52.0 %   MCV 99.3 80.0 - 100.0 fL   MCH 30.4 26.0 - 34.0 pg   MCHC 30.7 30.0 - 36.0 g/dL  RDW 16.3 (H) 11.5 - 15.5 %   Platelets 153 150 - 400 K/uL   nRBC 0.0 0.0 - 0.2 %   Neutrophils Relative % 74 %   Neutro Abs 5.3 1.7 - 7.7 K/uL   Lymphocytes Relative 14 %   Lymphs Abs 1.0 0.7 - 4.0 K/uL   Monocytes Relative 9 %   Monocytes Absolute 0.6 0.1 - 1.0 K/uL   Eosinophils Relative 2 %   Eosinophils Absolute 0.2 0.0 - 0.5 K/uL   Basophils Relative 0 %   Basophils Absolute 0.0 0.0 - 0.1 K/uL   Immature Granulocytes 1 %   Abs Immature Granulocytes 0.04 0.00 - 0.07 K/uL    Comment: Performed at [facility], [place] [address] [place], [place] [address]  Basic metabolic panel     Status: Abnormal   Collection Time: 08/09/19  8:11 PM  Result Value Ref Range   Sodium 142 135 - 145 mmol/L   Potassium 2.5 (LL) 3.5 - 5.1 mmol/L    Comment: NO VISIBLE HEMOLYSIS CRITICAL RESULT CALLED TO, READ BACK BY AND VERIFIED WITH: QUITA @  2128 ON 08/09/2019 C VARNER    Chloride 103 98 - 111 mmol/L   CO2 28 22 - 32 mmol/L   Glucose, Bld 91 70 - 99 mg/dL   BUN 15 8 - 23 mg/dL   Creatinine, Ser 1.52 (H) 0.61 - 1.24 mg/dL   Calcium 8.7 (L) 8.9 - 10.3 mg/dL   GFR calc non Af Amer 44 (L) >60 mL/min   GFR calc Af Amer 52 (L) >60 mL/min   Anion gap 11 5 - 15    Comment: Performed at [facility], [place] [address] [place], [place] [address]  Troponin I (High Sensitivity)     Status: None   Collection Time: 08/09/19  8:11 PM  Result Value Ref Range   Troponin I (High Sensitivity) 5 <18 ng/L    Comment: (NOTE) Elevated high sensitivity troponin I (hsTnI) values and significant  changes across serial measurements may suggest ACS but many other  chronic and acute conditions are known to elevate hsTnI results.  Refer to the Links section for chest pain algorithms and additional  guidance. Performed at [facility], [place] [address], [place], [place] [address]   Troponin I (High Sensitivity)     Status: None   Collection Time: 08/09/19 11:55 PM  Result Value Ref Range   Troponin I (High Sensitivity) 5 <18 ng/L    Comment: (NOTE) Elevated high sensitivity troponin I (hsTnI) values and significant  changes across serial measurements may suggest ACS but many other  chronic and acute conditions are known to elevate hsTnI results.  Refer to the "Links" section for chest pain algorithms and additional  guidance. Performed at [facility], [place] [address] [place], [place] [address]   Vitamin B12     Status: None   Collection Time: 08/09/19 11:55 PM  Result Value Ref Range   Vitamin B-12 183 180 - 914 pg/mL    Comment: (NOTE) This assay is not validated for testing neonatal or myeloproliferative syndrome specimens for Vitamin B12 levels. Performed at [facility], [place] [address] [place], [place] [address]   Folate     Status: None   Collection Time:  08/09/19 11:55 PM  Result Value Ref Range   Folate 6.9 >5.9 ng/mL    Comment: Performed at [facility], [place] [address], [place], [place] [address]  Iron  and TIBC     Status: Abnormal   Collection Time: 08/09/19 11:55 PM  Result Value Ref Range   Iron 41 (L) 45 - 182 ug/dL   TIBC 266 250 - 450 ug/dL   Saturation Ratios 15 (L) 17.9 - 39.5 %   UIBC 225 ug/dL    Comment: Performed at [facility], [place] [address], [place], [place] [address]  Ferritin     Status: None   Collection Time: 08/09/19 11:55 PM  Result Value Ref Range   Ferritin 42 24 - 336 ng/mL    Comment: Performed at [facility], [place] [address] [place], [place] [address]  Reticulocytes     Status: Abnormal   Collection Time: 08/09/19 11:55 PM  Result Value Ref Range   Retic Ct Pct 1.9 0.4 - 3.1 %   RBC. 2.95 (L) 4.22 - 5.81 MIL/uL   Retic Count, Absolute 54.9 19.0 - 186.0 K/uL   Immature Retic Fract 13.3 2.3 - 15.9 %    Comment: Performed at [facility], [place] [address], [place], [place] [address]  Urinalysis, Routine w reflex microscopic     Status: Abnormal   Collection Time: 08/09/19 11:55 PM  Result Value Ref Range   Color, Urine YELLOW (A) YELLOW   APPearance CLEAR (A) CLEAR   Specific Gravity, Urine 1.015 1.005 - 1.030   pH 5.0 5.0 - 8.0   Glucose, UA NEGATIVE NEGATIVE mg/dL   Hgb urine dipstick NEGATIVE NEGATIVE   Bilirubin Urine NEGATIVE NEGATIVE   Ketones, ur NEGATIVE NEGATIVE mg/dL   Protein, ur NEGATIVE NEGATIVE mg/dL   Nitrite NEGATIVE NEGATIVE   Leukocytes,Ua NEGATIVE NEGATIVE   RBC / HPF 0-5 0 - 5 RBC/hpf   WBC, UA 0-5 0 - 5 WBC/hpf   Bacteria, UA NONE SEEN NONE SEEN   Squamous Epithelial / LPF 0-5 0 - 5   Mucus PRESENT    Hyaline Casts, UA PRESENT     Comment: Performed at [facility], [place] [address] [place], [place]  [address]  Type and screen [place]     Status: None    Collection Time: 08/09/19 11:55 PM  Result Value Ref Range   ABO/RH(D) O POS    Antibody Screen NEG    Sample Expiration      08/12/2019,2359 Performed at [facility], [place] [address] [place], [place] [address]   SARS CORONAVIRUS 2 Nasal Swab Aptima Multi Swab     Status: None   Collection Time: 08/09/19 11:55 PM   Specimen: Aptima Multi Swab; Nasal Swab  Result Value Ref Range   SARS Coronavirus 2 NEGATIVE NEGATIVE    Comment: (NOTE) SARS-CoV-2 target nucleic acids are NOT DETECTED. The SARS-CoV-2 RNA is generally detectable in upper and lower respiratory specimens during the acute phase of infection. Negative results do not preclude SARS-CoV-2 infection, do not rule out co-infections with other pathogens, and should not be used as the sole basis for treatment or other patient management decisions. Negative results must be combined with clinical observations, patient history, and epidemiological information. The expected result is Negative. Fact Sheet for Patients: SugarRoll.be Fact Sheet for Healthcare Providers: https://www.woods-mathews.com/ This test is not yet approved or cleared by the [place] FDA and  has been authorized for detection and/or diagnosis of SARS-CoV-2 by FDA under an Emergency Use Authorization (EUA). This EUA will remain  in effect (meaning this test can be used) for the duration of the COVID-19 declaration  under Section 56 4(b)(1) of the Act, 21 U.S.C. section 360bbb-3(b)(1), unless the authorization is terminated or revoked sooner. Performed at [facility], [place] [address], [place], [place] [address]   ABO/Rh     Status: None   Collection Time: 08/09/19 11:55 PM  Result Value Ref Range   ABO/RH(D)      O POS Performed at [facility], [place] [address], [place], [place] [address]   POC occult blood, ED     Status: None   Collection Time: 08/10/19  1:28 AM   Result Value Ref Range   Fecal Occult Bld NEGATIVE NEGATIVE  Magnesium     Status: Abnormal   Collection Time: 08/10/19  5:41 AM  Result Value Ref Range   Magnesium 1.0 (L) 1.7 - 2.4 mg/dL    Comment: Performed at [facility], [place] [address], [place], [place] [address]  Phosphorus     Status: Abnormal   Collection Time: 08/10/19  5:41 AM  Result Value Ref Range   Phosphorus 2.1 (L) 2.5 - 4.6 mg/dL    Comment: Performed at [facility], [place] [address] [place], [place] [address]  TSH     Status: None   Collection Time: 08/10/19  5:41 AM  Result Value Ref Range   TSH 1.060 0.350 - 4.500 uIU/mL    Comment: Performed by a 3rd Generation assay with a functional sensitivity of <=0.01 uIU/mL. Performed at [facility], [place] [address] [place], [place] [address]   CBC     Status: Abnormal   Collection Time: 08/10/19  5:41 AM  Result Value Ref Range   WBC 8.0 4.0 - 10.5 K/uL   RBC 2.92 (L) 4.22 - 5.81 MIL/uL   Hemoglobin 9.0 (L) 13.0 - 17.0 g/dL   HCT 29.0 (L) 39.0 - 52.0 %   MCV 99.3 80.0 - 100.0 fL   MCH 30.8 26.0 - 34.0 pg   MCHC 31.0 30.0 - 36.0 g/dL   RDW 16.3 (H) 11.5 - 15.5 %   Platelets 172 150 - 400 K/uL   nRBC 0.0 0.0 - 0.2 %    Comment: Performed at [facility], [place] [address], [place], [place] [address]  Hemoglobin A1c     Status: Abnormal   Collection Time: 08/10/19  5:41 AM  Result Value Ref Range   Hgb A1c MFr Bld 4.6 (L) 4.8 - 5.6 %    Comment: (NOTE) Pre diabetes:          5.7%-6.4% Diabetes:              >6.4% Glycemic control for   <7.0% adults with diabetes    Mean Plasma Glucose 85.32 mg/dL    Comment: Performed at [place] [address], [place], [place] [address]  Basic metabolic panel     Status: Abnormal   Collection Time: 08/10/19  8:16 AM  Result Value Ref Range   Sodium 142 135 - 145 mmol/L   Potassium 3.2 (L) 3.5 - 5.1 mmol/L    Comment: DELTA CHECK  NOTED REPEATED TO VERIFY NO VISIBLE HEMOLYSIS    Chloride 104 98 - 111 mmol/L   CO2 28 22 - 32 mmol/L   Glucose, Bld 116 (H) 70 - 99 mg/dL   BUN 12 8 - 23 mg/dL   Creatinine, Ser 1.18 0.61 - 1.24 mg/dL   Calcium 9.3 8.9 - 10.3 mg/dL   GFR calc non Af Amer >60 >60  mL/min   GFR calc Af Amer >60 >60 mL/min   Anion gap 10 5 - 15    Comment: Performed at [facility], [place] [address] [place], [place] [address]  CBG monitoring, ED     Status: Abnormal   Collection Time: 08/10/19  8:16 AM  Result Value Ref Range   Glucose-Capillary 109 (H) 70 - 99 mg/dL  CBG monitoring, ED     Status: None   Collection Time: 08/10/19 11:44 AM  Result Value Ref Range   Glucose-Capillary 99 70 - 99 mg/dL  Magnesium     Status: Abnormal   Collection Time: 08/10/19  3:24 PM  Result Value Ref Range   Magnesium 1.5 (L) 1.7 - 2.4 mg/dL    Comment: Performed at [facility], [place] [address] [place], [place] [address]   Dg Chest Portable 1 View  Result Date: 08/09/2019 CLINICAL DATA:  Short of breath. Needle biopsy right lung mass 07/30/2019. EXAM: PORTABLE CHEST 1 VIEW COMPARISON:  CT chest 04/21/2019, chest x-ray 07/12/2019 FINDINGS: Cardiac enlargement without heart failure. Improved aeration in the lung bases bilaterally. No residual infiltrate Right lower lobe mass lesion again identified.  No pneumothorax. IMPRESSION: Right lower lobe mass lesion. Interval clearing of bibasilar airspace disease since the prior chest x-ray. Electronically Signed   By: Franchot Gallo M.D.   On: 08/09/2019 19:53    Pending Labs Unresulted Labs (From admission, onward)    Start     Ordered   08/17/19 0500  Creatinine, serum  (enoxaparin (LOVENOX)    CrCl >/= 30 ml/min)  Weekly,   R    Comments: while on enoxaparin therapy    08/10/19 1520   08/10/19 1521  CBC  (enoxaparin (LOVENOX)    CrCl >/= 30 ml/min)  Once,   STAT    Comments: Baseline for enoxaparin therapy IF NOT ALREADY DRAWN.   Notify MD if PLT < 100 K.    08/10/19 1520   08/10/19 1521  Creatinine, serum  (enoxaparin (LOVENOX)    CrCl >/= 30 ml/min)  Once,   STAT    Comments: Baseline for enoxaparin therapy IF NOT ALREADY DRAWN.    08/10/19 1520          Vitals/Pain Today's Vitals   08/10/19 1430 08/10/19 1510 08/10/19 1541 08/10/19 1555  BP: (!) 145/54 (!) 143/63  137/61  Pulse: 83 73  65  Resp: 17 18  19   Temp:      TempSrc:      SpO2: 95% 96% 95% 96%  Weight:      Height:      PainSc:        Isolation Precautions No active isolations  Medications Medications  atorvastatin (LIPITOR) tablet 20 mg (20 mg Oral Given 08/10/19 0958)  QUEtiapine (SEROQUEL) tablet 25 mg (25 mg Oral Given 08/10/19 0958)  pantoprazole (PROTONIX) EC tablet 40 mg (40 mg Oral Given 08/10/19 0957)  sucralfate (CARAFATE) tablet 1 g (has no administration in time range)  ferrous sulfate tablet 325 mg (has no administration in time range)  folic acid (FOLVITE) tablet 1 mg (1 mg Oral Given 08/10/19 0958)  albuterol (VENTOLIN HFA) 108 (90 Base) MCG/ACT inhaler 2 puff (has no administration in time range)  mometasone-formoterol (DULERA) 200-5 MCG/ACT inhaler 2 puff (2 puffs Inhalation Not Given 08/10/19 0958)  acetaminophen (TYLENOL) tablet 650 mg (has no administration in time range)    Or  acetaminophen (TYLENOL) suppository 650 mg (has no administration in time range)  ondansetron (ZOFRAN) tablet 4 mg (has no administration in time range)    Or  ondansetron (ZOFRAN) injection 4 mg (has no administration in time range)  insulin aspart (novoLOG) injection 0-5 Units (has no administration in time range)  insulin aspart (novoLOG) injection 0-9 Units (0 Units Subcutaneous Not Given 08/10/19 1155)  multivitamin with minerals tablet 1 tablet (1 tablet Oral Given 08/10/19 0957)  vitamin B-12 (CYANOCOBALAMIN) tablet 1,000 mcg (1,000 mcg Oral Given 08/10/19 0956)  potassium chloride SA (K-DUR) CR tablet 40 mEq (40 mEq Oral Given 08/10/19  1242)  HYDROcodone-acetaminophen (NORCO/VICODIN) 5-325 MG per tablet 1 tablet (has no administration in time range)  enoxaparin (LOVENOX) injection 40 mg (has no administration in time range)  potassium chloride 10 mEq in 100 mL IVPB (0 mEq Intravenous Stopped 08/10/19 0338)  0.9 % NaCl with KCl 20 mEq/ L  infusion ( Intravenous Stopped 08/10/19 0338)  potassium chloride SA (K-DUR) CR tablet 20 mEq (20 mEq Oral Given 08/10/19 0120)  magnesium sulfate IVPB 2 g 50 mL (0 g Intravenous Stopped 08/10/19 1100)  0.9 % NaCl with KCl 20 mEq/ L  infusion ( Intravenous Rate/Dose Verify 08/10/19 1100)    Mobility walks

## 2019-08-10 NOTE — Evaluation (Signed)
Occupational Therapy Evaluation Patient Details Name: John [name] MRN: [id number] DOB: 1946-01-25 Today's Date: 08/10/2019    History of Present Illness 73 yo male admitted with hypokalemia, hypotension, noncompliance with wearing O2. Hx of dementia, DM, CHF, lung ca, MI, syncope, pulm HTN   Clinical Impression   Pt was admitted for the above.  He is mostly at a supervision/set up level for basic adls and toilet transfers.  OT gave him min guard assist once, when donning socks as surface height was high. At baseline, he lives with his daughter and per notes she is not able to manage his care. Will follow in acute setting with mod I level goals.      Follow Up Recommendations  SNF;Supervision/Assistance - 24 hour(family requests SNF)    Equipment Recommendations  None recommended by OT    Recommendations for Other Services       Precautions / Restrictions Precautions Precautions: Fall Restrictions Weight Bearing Restrictions: No      Mobility Bed Mobility Overal bed mobility: Modified Independent                Transfers       Sit to Stand: Modified independent (Device/Increase time)              Balance Overall balance assessment: Mild deficits observed, not formally tested                                         ADL either performed or assessed with clinical judgement   ADL Overall ADL's : Needs assistance/impaired Eating/Feeding: Independent   Grooming: Oral care;Supervision/safety;Standing   Upper Body Bathing: Min guard   Lower Body Bathing: Set up;Supervison/ safety   Upper Body Dressing : Set up   Lower Body Dressing: Min guard Lower Body Dressing Details (indicate cue type and reason): due to surface height Toilet Transfer: Copy Details (indicate cue type and reason): ambulated around room and back to gurney Toileting- Clothing Manipulation and Hygiene: Supervision/safety          General ADL Comments: pt can perform ADLs at mostly set up/supervison level.  Min guard given when donning socks from gurney due to height of surface.  Supervision walking to sink with RW and one cue to keep walker with him as he went to leave it to side.       Vision         Perception     Praxis      Pertinent Vitals/Pain Pain Assessment: No/denies pain     Hand Dominance Right   Extremity/Trunk Assessment Upper Extremity Assessment Upper Extremity Assessment: Overall WFL for tasks assessed           Communication Communication Communication: No difficulties   Cognition Arousal/Alertness: Awake/alert Behavior During Therapy: WFL for tasks assessed/performed                                   General Comments: h/o vascular dementia; memory NT   General Comments   VSS, not dizzy    Exercises     Shoulder Instructions      Home Living Family/patient expects to be discharged to:: Private residence Living Arrangements: Children                 Bathroom Shower/Tub: Hospital doctor  Toilet: Standard     Home Equipment: Walker - 2 wheels;Cane - single point          Prior Functioning/Environment Level of Independence: Independent with assistive device(s)        Comments: has both walker and cane but doesn't use them mostly. He stated if he goes into community he will take one with him        OT Problem List: Impaired balance (sitting and/or standing);Decreased safety awareness      OT Treatment/Interventions: Self-care/ADL training;DME and/or AE instruction;Patient/family education;Balance training;Therapeutic activities;Cognitive remediation/compensation    OT Goals(Current goals can be found in the care plan section) Acute Rehab OT Goals Patient Stated Goal: home OT Goal Formulation: With patient Time For Goal Achievement: 08/24/19 Potential to Achieve Goals: Good ADL Goals Pt Will Transfer to Toilet: with  modified independence;ambulating;bedside commode Additional ADL Goal #1: pt will perform ADL at mod I level with RW or cane Additional ADL Goal #2: pt will not need any cues for safety performing adl/toileting  OT Frequency: Min 2X/week   Barriers to D/C:            Co-evaluation              AM-PAC OT "6 Clicks" Daily Activity     Outcome Measure Help from another person eating meals?: None Help from another person taking care of personal grooming?: A Little Help from another person toileting, which includes using toliet, bedpan, or urinal?: A Little Help from another person bathing (including washing, rinsing, drying)?: A Little Help from another person to put on and taking off regular upper body clothing?: A Little Help from another person to put on and taking off regular lower body clothing?: A Little 6 Click Score: 19   End of Session    Activity Tolerance: Patient tolerated treatment well Patient left: in bed;with call bell/phone within reach  OT Visit Diagnosis: Muscle weakness (generalized) (M62.81)                Time: 3818-2993 OT Time Calculation (min): 23 min Charges:  OT General Charges $OT Visit: 1 Visit OT Evaluation $OT Eval Low Complexity: [place], OTR/L Acute Rehabilitation Services [phone] [facility] pager [phone] office 08/10/2019  [place] 08/10/2019, 3:56 PM

## 2019-08-10 NOTE — ED Notes (Signed)
PT at bedside.

## 2019-08-10 NOTE — NC FL2 (Signed)
[place] LEVEL OF CARE SCREENING TOOL     IDENTIFICATION  Patient Name: John [name] Birthdate: Oct 04, 1946 Sex: male Admission Date (Current Location): 08/09/2019  [facility] and [place] Number:  Herbalist and Address:  [facility],  [place] [address] [place]      Provider Number: [id number]  Attending Physician Name and Address:  Mercy Riding, MD  Relative Name and Phone Number:       Current Level of Care: Hospital Recommended Level of Care: [place] Prior Approval Number:    Date Approved/Denied:   PASRR Number: Pending  Discharge Plan: SNF    Current Diagnoses: Patient Active Problem List   Diagnosis Date Noted  . Acute metabolic encephalopathy 35/57/3220  . Goals of care, counseling/discussion 08/05/2019  . Acute renal failure (ARF) ([place]) 07/08/2019  . Hypokalemia 07/08/2019  . Vascular dementia without behavioral disturbance ([place])   . Helicobacter pylori gastritis   . Chronic duodenal ulcer with hemorrhage   . Multiple gastric ulcers   . Acute esophagitis   . Gastrointestinal hemorrhage 04/20/2019  . Vitamin B12 deficiency 04/20/2019  . Folate deficiency 04/20/2019  . Dehydration 04/20/2019  . Hypotension 04/20/2019  . Melena   . Acute blood loss anemia   . CKD (chronic kidney disease), stage III ([place]) 04/08/2019  . Right lower lobe lung mass 04/08/2019  . Syncope and collapse 04/08/2019  . Symptomatic anemia 04/08/2019  . Occult GI bleeding 04/08/2019  . Hyperglycemia 04/08/2019  . Leukocytosis 04/08/2019  . Syncope 04/08/2019  . Low hemoglobin   . Dyspnea on exertion 08/25/2017  . Bursitis of elbow 03/15/2016  . Obesity (BMI 30-39.9) 12/03/2015  . Primary cancer of right lower lobe of lung ([place]) 06/14/2015  . Solitary pulmonary nodule 03/13/2015  . RVF (right ventricular failure) ([place]) 11/04/2014  . COPD II/III with reversibility  12/19/2012  . Chronic respiratory failure with  hypoxia ([place]) 12/18/2012  . Drug-induced hyperglycemia 12/08/2012  . Allergic drug rash 12/06/2012  . Pulmonary HTN ([place]) 03/18/2012  . Abnormal echocardiogram 03/18/2012  . LV dysfunction 03/18/2012  . COPD exacerbation ([place]) 02/22/2012  . Chronic diastolic CHF (congestive heart failure) ([place]) 02/22/2012  . HTN (hypertension) 02/22/2012  . CAD (coronary artery disease) 02/22/2012  . Acute on chronic systolic heart failure (HCC) 02/22/2012    Orientation RESPIRATION BLADDER Height & Weight     Self, Situation(fluctuates)  Normal Continent Weight: 175 lb (79.4 kg) Height:  5\' 9"  (175.3 cm)  BEHAVIORAL SYMPTOMS/MOOD NEUROLOGICAL BOWEL NUTRITION STATUS      Continent Diet(Carb modified)  AMBULATORY STATUS COMMUNICATION OF NEEDS Skin   Supervision(uses cane and rolling walker) Verbally Surgical wounds(incision puncture flank right upper)                       Personal Care Assistance Level of Assistance  Bathing, Feeding, Dressing Bathing Assistance: Independent Feeding assistance: Independent Dressing Assistance: Independent     Functional Limitations Info  Sight, Hearing, Speech Sight Info: Adequate Hearing Info: Adequate Speech Info: Adequate    SPECIAL CARE FACTORS FREQUENCY  PT (By licensed PT), OT (By licensed OT)     PT Frequency: 5x weekly OT Frequency: 5x weekly            Contractures Contractures Info: Not present    Additional Factors Info  Code Status, Allergies, Insulin Sliding Scale, Psychotropic Code Status Info: Full Allergies Info: Lisinopril, Levaquin/Levofloxacin In D5w Psychotropic Info: QUEtiapine (SEROQUEL) tablet 25 mg;  2x daily Insulin Sliding Scale Info: insulin aspart (novoLOG) injection 0-5 Units; at bedtime and insulin aspart (novoLOG) injection 0-9 Units; 3x daily with meals       Current Medications (08/10/2019):  This is the current hospital active medication list Current Facility-Administered Medications  Medication Dose  Route Frequency Provider Last Rate Last Dose  . acetaminophen (TYLENOL) tablet 650 mg  650 mg Oral Q6H PRN Toy Baker, MD       Or  . acetaminophen (TYLENOL) suppository 650 mg  650 mg Rectal Q6H PRN Doutova, Anastassia, MD      . albuterol (VENTOLIN HFA) 108 (90 Base) MCG/ACT inhaler 2 puff  2 puff Inhalation Q4H PRN Doutova, Anastassia, MD      . atorvastatin (LIPITOR) tablet 20 mg  20 mg Oral Daily Doutova, Anastassia, MD   20 mg at 08/10/19 0958  . enoxaparin (LOVENOX) injection 40 mg  40 mg Subcutaneous Q24H Wendee Beavers T, MD      . Derrill Memo ON 08/11/2019] ferrous sulfate tablet 325 mg  325 mg Oral Q M,W,F Doutova, Anastassia, MD      . folic acid (FOLVITE) tablet 1 mg  1 mg Oral Daily Doutova, Anastassia, MD   1 mg at 08/10/19 0958  . HYDROcodone-acetaminophen (NORCO/VICODIN) 5-325 MG per tablet 1 tablet  1 tablet Oral Q4H PRN Gonfa, Taye T, MD      . insulin aspart (novoLOG) injection 0-5 Units  0-5 Units Subcutaneous QHS Doutova, Anastassia, MD      . insulin aspart (novoLOG) injection 0-9 Units  0-9 Units Subcutaneous TID WC Doutova, Anastassia, MD      . mometasone-formoterol (DULERA) 200-5 MCG/ACT inhaler 2 puff  2 puff Inhalation BID Doutova, Anastassia, MD      . multivitamin with minerals tablet 1 tablet  1 tablet Oral Daily Mercy Riding, MD   1 tablet at 08/10/19 0957  . ondansetron (ZOFRAN) tablet 4 mg  4 mg Oral Q6H PRN Doutova, Anastassia, MD       Or  . ondansetron (ZOFRAN) injection 4 mg  4 mg Intravenous Q6H PRN Doutova, Anastassia, MD      . pantoprazole (PROTONIX) EC tablet 40 mg  40 mg Oral BID AC Doutova, Anastassia, MD   40 mg at 08/10/19 0957  . potassium chloride SA (K-DUR) CR tablet 40 mEq  40 mEq Oral Q4H Wendee Beavers T, MD   40 mEq at 08/10/19 1242  . QUEtiapine (SEROQUEL) tablet 25 mg  25 mg Oral BID Toy Baker, MD   25 mg at 08/10/19 0958  . sucralfate (CARAFATE) tablet 1 g  1 g Oral See admin instructions Doutova, Anastassia, MD      . vitamin  B-12 (CYANOCOBALAMIN) tablet 1,000 mcg  1,000 mcg Oral Daily Mercy Riding, MD   1,000 mcg at 08/10/19 3710   Current Outpatient Medications  Medication Sig Dispense Refill  . albuterol (PROAIR HFA) 108 (90 BASE) MCG/ACT inhaler INHALE 2 PUFFS BY MOUTH EVERY 4 HOURS AS NEEDED FOR WHEEZING (Patient taking differently: Inhale 2 puffs into the lungs every 4 (four) hours as needed for wheezing. ) 1 Inhaler 11  . allopurinol (ZYLOPRIM) 300 MG tablet Take 300 mg by mouth daily.    Marland Kitchen amLODipine (NORVASC) 10 MG tablet Take 10 mg by mouth daily.    Marland Kitchen atorvastatin (LIPITOR) 20 MG tablet Take 1 tablet (20 mg total) by mouth daily. 30 tablet 0  . bisoprolol (ZEBETA) 5 MG tablet Take 1 tablet (5 mg total) by  mouth daily. 30 tablet 0  . Cholecalciferol (VITAMIN D3) 50 MCG (2000 UT) capsule Take 2,000 Units by mouth daily.     . cloNIDine (CATAPRES) 0.1 MG tablet Take 0.1 mg by mouth 2 (two) times a day.    . ferrous sulfate 325 (65 FE) MG tablet Take 1 tablet (325 mg total) by mouth every Monday, Wednesday, and Friday. 30 tablet 0  . folic acid (FOLVITE) 1 MG tablet Take 1 tablet (1 mg total) by mouth daily. 30 tablet 11  . furosemide (LASIX) 20 MG tablet Take 1 tablet (20 mg total) by mouth every Monday, Wednesday, and Friday. 30 tablet 0  . metFORMIN (GLUCOPHAGE) 1000 MG tablet Take 0.5 tablets (500 mg total) by mouth 2 (two) times daily. 60 tablet 0  . naproxen (NAPROSYN) 500 MG tablet Take 500 mg by mouth 2 (two) times daily as needed for moderate pain.    . pantoprazole (PROTONIX) 40 MG tablet Take 1 tablet (40 mg total) by mouth 2 (two) times daily before a meal. 180 tablet 3  . QUEtiapine (SEROQUEL) 25 MG tablet Take 1 tablet (25 mg total) by mouth 2 (two) times daily. 60 tablet 0  . senna-docusate (SENOKOT-S) 8.6-50 MG tablet Take 1 tablet by mouth at bedtime. 30 tablet 0  . sucralfate (CARAFATE) 1 g tablet Take 1 g by mouth See admin instructions. Four time daily and at bedtime    . SYMBICORT 160-4.5  MCG/ACT inhaler Inhale 2 puffs into the lungs 2 (two) times daily.    . Vitamin D, Ergocalciferol, (DRISDOL) 1.25 MG (50000 UT) CAPS capsule Take 50,000 Units by mouth every Friday.        Discharge Medications: Please see discharge summary for a list of discharge medications.  Relevant Imaging Results:  Relevant Lab Results:   Additional Information SS# [id number]  Alphonse Guild [place] Nishi, LCSW

## 2019-08-10 NOTE — Progress Notes (Addendum)
PROGRESS NOTE  John [name] NFA:[id number] DOB: 04-Jun-1946   PCP: [name], NP  Patient is from: Home  DOA: 08/09/2019 LOS: 0  Brief Narrative / Interim history: 73 year old male with history of advanced dementia, recurrent lung cancer, diastolic CHF, pulmonary HTN, CAD, HTN, COPD not on oxygen, DM-2 and duodenal ulcer disease but his low blood pressure, altered mental status, poor p.o. intake and found to have orthostatic hypotension and severe hypokalemia and hypomagnesemia.  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  Restarted radiotherapy to recurrent lesion of RLL on 08/06/2019.  He is under the care of Dr. Tammi Klippel.  Subjective: No major events overnight of this morning.  Received about a total of 60 mEq of potassium with improvement in his potassium level.  Phosphorus and magnesium low.  Patient has no complaint but very poor insight to why he was admitted.  He has no complaints this morning.  He is asking to go up to stay and shower.  He denies pain anywhere.  Denies shortness of breath, cough, nausea, vomiting or urinary symptoms.  Objective: Vitals:   08/10/19 1300 08/10/19 1330 08/10/19 1417 08/10/19 1430  BP: (!) 122/57  130/65 (!) 145/54  Pulse: 63 80 64 83  Resp: 15 (!) 21 17 17   Temp:      TempSrc:      SpO2: 92% 95% 92% 95%  Weight:      Height:        Intake/Output Summary (Last 24 hours) at 08/10/2019 1509 Last data filed at 08/10/2019 1100 Gross per 24 hour  Intake 1050.33 ml  Output 1175 ml  Net -124.67 ml   Filed Weights   08/09/19 1931  Weight: 79.4 kg    Examination:  GENERAL: No acute distress.  Appears well.  HEENT: MMM.  Vision and hearing grossly intact.  NECK: Supple.  No apparent JVD.  RESP:  No IWOB. Good air movement bilaterally. CVS:  RRR. Heart sounds normal.  ABD/GI/GU: Bowel sounds present.  Soft. Non tender.  MSK/EXT:  Moves extremities. No apparent deformity or edema.  SKIN: no apparent skin lesion or wound NEURO: Awake, alert and oriented self, place, the president and family name but not time.  Otherwise neuro exam grossly intact. PSYCH: Calm.  I have personally reviewed the following labs and images:  Radiology Studies: Dg Chest Portable 1 View  Result Date: 08/09/2019 CLINICAL DATA:  Short of breath. Needle biopsy right lung mass 07/30/2019. EXAM: PORTABLE CHEST 1 VIEW COMPARISON:  CT chest 04/21/2019, chest x-ray 07/12/2019 FINDINGS: Cardiac enlargement without heart failure. Improved aeration in the lung bases bilaterally. No residual infiltrate Right lower lobe mass lesion again identified.  No pneumothorax. IMPRESSION: Right lower lobe mass lesion. Interval clearing of bibasilar airspace disease since the prior chest x-ray. Electronically Signed   By: Franchot Gallo M.D.   On: 08/09/2019 19:53    Microbiology: Recent Results (from the past 240 hour(s))  SARS CORONAVIRUS 2 Nasal Swab Aptima Multi Swab     Status: None   Collection Time: 08/09/19 11:55 PM   Specimen: Aptima Multi Swab; Nasal Swab  Result Value Ref Range Status   SARS Coronavirus 2 NEGATIVE NEGATIVE Final    Comment: (NOTE) SARS-CoV-2 target nucleic acids are NOT DETECTED. The SARS-CoV-2 RNA is generally detectable in upper and lower respiratory specimens during the acute phase of infection. Negative results do not preclude SARS-CoV-2 infection, do  not rule out co-infections with other pathogens, and should not be used as the sole basis for treatment or other patient management decisions. Negative results must be combined with clinical observations, patient history, and epidemiological information. The expected result is Negative. Fact Sheet for Patients: SugarRoll.be Fact Sheet for Healthcare Providers: https://www.woods-mathews.com/ This test is not yet  approved or cleared by the [place] FDA and  has been authorized for detection and/or diagnosis of SARS-CoV-2 by FDA under an Emergency Use Authorization (EUA). This EUA will remain  in effect (meaning this test can be used) for the duration of the COVID-19 declaration under Section 56 4(b)(1) of the Act, 21 U.S.C. section 360bbb-3(b)(1), unless the authorization is terminated or revoked sooner. Performed at [facility], [place] [address], [place], [place] [address]     Sepsis Labs: Invalid input(s): PROCALCITONIN, LACTICIDVEN  Urine analysis:    Component Value Date/Time   COLORURINE YELLOW (A) 08/09/2019 2355   APPEARANCEUR CLEAR (A) 08/09/2019 2355   LABSPEC 1.015 08/09/2019 2355   PHURINE 5.0 08/09/2019 2355   GLUCOSEU NEGATIVE 08/09/2019 2355   HGBUR NEGATIVE 08/09/2019 2355   BILIRUBINUR NEGATIVE 08/09/2019 2355   KETONESUR NEGATIVE 08/09/2019 2355   PROTEINUR NEGATIVE 08/09/2019 2355   UROBILINOGEN 1.0 02/22/2012 0238   NITRITE NEGATIVE 08/09/2019 2355   LEUKOCYTESUR NEGATIVE 08/09/2019 2355    Anemia Panel: Recent Labs    08/09/19 2355  VITAMINB12 183  FOLATE 6.9  FERRITIN 42  TIBC 266  IRON 41*  RETICCTPCT 1.9    Thyroid Function Tests: Recent Labs    08/10/19 0541  TSH 1.060    Lipid Profile: No results for input(s): CHOL, HDL, LDLCALC, TRIG, CHOLHDL, LDLDIRECT in the last 72 hours.  CBG: Recent Labs  Lab 08/10/19 0816 08/10/19 1144  GLUCAP 109* 99    HbA1C: Recent Labs    08/10/19 0541  HGBA1C 4.6*    BNP (last 3 results): No results for input(s): PROBNP in the last 8760 hours.  Cardiac Enzymes: No results for input(s): CKTOTAL, CKMB, CKMBINDEX, TROPONINI in the last 168 hours.  Coagulation Profile: No results for input(s): INR, PROTIME in the last 168 hours.  Liver Function Tests: Recent Labs  Lab 08/05/19 0925  AST 8*  ALT 7  ALKPHOS 73  BILITOT 0.4  PROT 6.7  ALBUMIN 3.2*   No results for input(s):  LIPASE, AMYLASE in the last 168 hours. No results for input(s): AMMONIA in the last 168 hours.  Basic Metabolic Panel: Recent Labs  Lab 08/05/19 0925 08/09/19 2011 08/10/19 0541 08/10/19 0816  NA 144 142  --  142  K 2.6* 2.5*  --  3.2*  CL 103 103  --  104  CO2 28 28  --  28  GLUCOSE 125* 91  --  116*  BUN 12 15  --  12  CREATININE 1.48* 1.52*  --  1.18  CALCIUM 9.4 8.7*  --  9.3  MG  --   --  1.0*  --   PHOS  --   --  2.1*  --    GFR: Estimated Creatinine Clearance: 54.9 mL/min (by C-G formula based on SCr of 1.18 mg/dL).  CBC: Recent Labs  Lab 08/05/19 0925 08/09/19 2011 08/10/19 0541  WBC 8.5 7.2 8.0  NEUTROABS 6.4 5.3  --   HGB 9.5* 8.4* 9.0*  HCT 30.3* 27.4* 29.0*  MCV 97.7 99.3 99.3  PLT 228 153 172    Procedures:  None  Microbiology summarized: SARS-CoV-2 screen negative.  Assessment & Plan: Acute metabolic encephalopathy in patient with advanced dementia: Likely combination of dehydration due to poor p.o. intake and significant electrolyte derangement.  He is only on low-dose Seroquel and clonidine.  Encephalopathy seems to have improved.  He is oriented x4- time. -Reorientation delirium precaution. -Treat treatable causes. -Continue home Seroquel.  On low-dose. -PT/OT eval  Orthostatic hypotension: Significant orthostasis at home per EMS.  He is on beta-blocker, clonidine and diuretics which could contribute.  Likely from dehydration. -Continue holding BB, clonidine diuretics now. -Recheck orthostatic vitals.  AKI: Suspect prerenal insult.  He is also on naproxen and Lasix which could contribute.  Resolved. -Avoid nephrotoxic meds -Continue monitoring.  Hypokalemia/hypomagnesemia/hypophosphatemia: Hypokalemia improved.  Likely due to poor p.o. intake and possibly from diuretics. -Replete and recheck.  Recurrent fall/debility: Daughter reports recurrent fall at home.  Has walker but difficulty using. 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  Restarted radiotherapy to recurrent lesion of RLL on 08/06/2019.  He is under the care of Dr. Tammi Klippel. -We will notify oncology.  Chronic diastolic CHF pulmonary HTN: Euvolemic.  -Hold home cardiac meds for now. -Closely monitor for fluid status  Chronic COPD: Stable. -Continue home meds  NIDDM-2: On metformin at home. -SSI and CBG monitoring -Continue home statin.  Anemia of chronic disease: Hgb at baseline.  Anemia panel with mild iron deficiency. -Monitor  History of gout: -Continue home allopurinol.  Hypertension: Normotensive -Hold home cardiac meds including amlodipine.  DVT prophylaxis: Subcu Lovenox Code Status: Full code Family Communication: Updated patient's daughter over the phone. Disposition Plan: Remains inpatient due to significant electrolyte derangement and unsafe disposition.  Consultants: None   Antimicrobials: Anti-infectives (From admission, onward)   None      Sch Meds:  Scheduled Meds: . atorvastatin  20 mg Oral Daily  . [START ON 08/11/2019] ferrous sulfate  325 mg Oral Q M,W,F  . folic acid  1 mg Oral Daily  . insulin aspart  0-5 Units Subcutaneous QHS  . insulin aspart  0-9 Units Subcutaneous TID WC  . mometasone-formoterol  2 puff Inhalation BID  . multivitamin with minerals  1 tablet Oral Daily  . pantoprazole  40 mg Oral BID AC  . potassium chloride  40 mEq Oral Q4H  . QUEtiapine  25 mg Oral BID  . sucralfate  1 g Oral See admin instructions  . vitamin B-12  1,000 mcg Oral Daily   Continuous Infusions: PRN Meds:.acetaminophen **OR** acetaminophen, albuterol, HYDROcodone-acetaminophen, ondansetron **OR** ondansetron (ZOFRAN) IV   Nyelli Samara T. [place]  If 7PM-7AM, please contact night-coverage [email or web address] Password [facility] 08/10/2019,  3:09 PM

## 2019-08-10 NOTE — Progress Notes (Signed)
CSW received phone call from patient's daughter, Kyen Taite, asking for an update on patient's progress. CSW explained that patient in under oberservation and is to be moved to a medical floor. CSW explained PT is now seeing patient and went over the process for faxing patient out to SNFs and waiting on bed offers and insurance authorization. CSW also explained that when patient is moved to a different floor the case will be transferred to the IP CSW. Patient's daughter asked to be updated on any changes. Patient's daughter asked that she been the only one to contact about patient.   CSW will continue to follow up on discharge needs.  [place], LCSW Transitions of Care Department [facility] ED [phone]

## 2019-08-10 NOTE — TOC Initial Note (Signed)
Transition of Care [facility]) - Initial/Assessment Note    Patient Details  Name: John [name] MRN: [id number] Date of Birth: March 20, 1946  Transition of Care [facility]) CM/SW Contact:    Erenest Rasher, RN Phone Number: [phone] 08/10/2019, 5:01 PM  Clinical Narrative:                 Spoke to Clarisse Gouge # [phone]. States she has noticed a decline in pt and states his appetite is poor, and he is weak. She is interested in him going to SNF rehab. Waiting on PT/OT recommendations, will have PT/OT follow up on 08/11/2019. Pt is receiving radiation and may miss PT/OT sessions if he is at a SNF as sessions are given 5x per week. Spoke to dtr about HHPT and OT. States he had [facility] in the past. Pt has a RW at home. Spoke to attending will update TOC CM/CSW on pt's dc plan. TOC CM/CSW will arrange SNF or HH, waiting for final recommendations.   Expected Discharge Plan: Skilled Nursing Facility Barriers to Discharge: Continued Medical Work up   Patient Goals and CMS Choice Patient states their goals for this hospitalization and ongoing recovery are:: wants pt to get stronger CMS Medicare.gov Compare Post Acute Care list provided to:: Patient Represenative (must comment) Choice offered to / list presented to : Adult Children  Expected Discharge Plan and Services Expected Discharge Plan: [place] In-house Referral: Clinical Social Work Discharge Planning Services: CM Consult Post Acute Care Choice: [place] arrangements for the past 2 months: Single Family Home Expected Discharge Date: 08/12/19                         HH Arranged: PT, OT, Nurse's Aide, Social Work          Prior Living Arrangements/Services Living arrangements for the past 2 months: [place] with:: Adult Children   Do you feel safe going back to the place where you live?: No(Per family)      Need for Family Participation in Patient Care: Yes (Comment) Care  giver support system in place?: Yes (comment) Current home services: DME([facility]) Criminal Activity/Legal Involvement Pertinent to Current Situation/Hospitalization: No - Comment as needed  Activities of Daily Living Home Assistive Devices/Equipment: Environmental consultant (specify type) ADL Screening (condition at time of admission) Patient's cognitive ability adequate to safely complete daily activities?: No Is the patient deaf or have difficulty hearing?: No Does the patient have difficulty seeing, even when wearing glasses/contacts?: No Does the patient have difficulty concentrating, remembering, or making decisions?: Yes Patient able to express need for assistance with ADLs?: Yes Does the patient have difficulty dressing or bathing?: Yes Independently performs ADLs?: No Communication: Independent Does the patient have difficulty walking or climbing stairs?: Yes Weakness of Legs: None Weakness of Arms/Hands: None  Permission Sought/Granted Permission sought to share information with : Chartered certified accountant granted to share information with : Yes, Verbal Permission Granted              Emotional Assessment       Orientation: : Fluctuating Orientation (Suspected and/or reported [place])   Psych Involvement: No (comment)  Admission diagnosis:  Hypokalemia [M57.8] Acute metabolic encephalopathy [I69.62] Patient Active Problem List   Diagnosis Date Noted  . Acute metabolic encephalopathy 95/28/4132  . Goals of care, counseling/discussion 08/05/2019  . Acute renal failure (ARF) ([place]) 07/08/2019  . Hypokalemia 07/08/2019  . Vascular dementia without  behavioral disturbance ([place])   . Helicobacter pylori gastritis   . Chronic duodenal ulcer with hemorrhage   . Multiple gastric ulcers   . Acute esophagitis   . Gastrointestinal hemorrhage 04/20/2019  . Vitamin B12 deficiency 04/20/2019  . Folate deficiency 04/20/2019  . Dehydration 04/20/2019  . Hypotension  04/20/2019  . Melena   . Acute blood loss anemia   . CKD (chronic kidney disease), stage III ([place]) 04/08/2019  . Right lower lobe lung mass 04/08/2019  . Syncope and collapse 04/08/2019  . Symptomatic anemia 04/08/2019  . Occult GI bleeding 04/08/2019  . Hyperglycemia 04/08/2019  . Leukocytosis 04/08/2019  . Syncope 04/08/2019  . Low hemoglobin   . Dyspnea on exertion 08/25/2017  . Bursitis of elbow 03/15/2016  . Obesity (BMI 30-39.9) 12/03/2015  . Primary cancer of right lower lobe of lung ([place]) 06/14/2015  . Solitary pulmonary nodule 03/13/2015  . RVF (right ventricular failure) ([place]) 11/04/2014  . COPD II/III with reversibility  12/19/2012  . Chronic respiratory failure with hypoxia ([place]) 12/18/2012  . Drug-induced hyperglycemia 12/08/2012  . Allergic drug rash 12/06/2012  . Pulmonary HTN ([place]) 03/18/2012  . Abnormal echocardiogram 03/18/2012  . LV dysfunction 03/18/2012  . COPD exacerbation ([place]) 02/22/2012  . Chronic diastolic CHF (congestive heart failure) ([place]) 02/22/2012  . HTN (hypertension) 02/22/2012  . CAD (coronary artery disease) 02/22/2012  . Acute on chronic systolic heart failure ([place]) 02/22/2012   PCP:  [name], NP Pharmacy:   [place], [place] - [address] [place] [address] Phone: [phone] Fax: [phone]     Social Determinants of Health (SDOH) Interventions    Readmission Risk Interventions Readmission Risk Prevention Plan 07/09/2019 04/30/2019 04/30/2019  Transportation Screening Complete - Complete  Medication Review (RN Care Manager) Complete - Complete  PCP or Specialist appointment within 3-5 days of discharge - Complete -  [place] or Home Care Consult Complete - Complete  SW Recovery Care/Counseling Consult Complete - -  Palliative Care Screening Not Applicable - Not [place] Not Applicable - Not Applicable  Some recent data might be hidden

## 2019-08-10 NOTE — Progress Notes (Signed)
CSW received a call from pt's daughter who is requesting an update.  CSW will continue to follow for D/C needs.  Alphonse Guild. Epifanio Labrador, LCSW, LCAS, CSI Transitions of Care Clinical Social Worker Care Coordination Department Ph: [phone]

## 2019-08-10 NOTE — Progress Notes (Addendum)
CSW paged and received a call back from pt's hospitalist and informed hospitalist of the request from the state for (see below):  "Date Author Message 08/10/2019 10:54 Schultz,Katherine Please send signed by an MD an SNF FL2, current H&P, any psych notes and signed MD note for 30 days. For faster response please upload information into [place] MUST or fax to [phone]. Please respond back in [facility]CM[facility] after sending requested information."  CSW placed an FL-2 into hospitalist's inbox to be signed but at this time due to pt's demonstrated baseline being met AEB pt's PT notes stating pt ambulated 160 feet with his rolling walker, provider is wanting to speak to family about options which may be more appropriate.  Provider stated provider will speak to family.  TOC RN CM will call pt's daughter to explain barriers to placement on behalf of [facility] ED CSW.  UPDATE. 6:03 PM Provider signed FL-2 and states sees now appropriateness for a progress note to [place] MUST stating pt can benefit from 30 day rehab SNF stay.  CSW will continue to follow for D/C needs.  Alphonse Guild. Nolyn Eilert, LCSW, LCAS, CSI Transitions of Care Clinical Social Worker Care Coordination Department Ph: [phone]

## 2019-08-11 ENCOUNTER — Encounter (HOSPITAL_COMMUNITY): Payer: Self-pay | Admitting: *Deleted

## 2019-08-11 ENCOUNTER — Ambulatory Visit: Admission: RE | Admit: 2019-08-11 | Payer: Medicare Other | Source: Ambulatory Visit

## 2019-08-11 LAB — CBC
HCT: 32.3 % — ABNORMAL LOW (ref 39.0–52.0)
Hemoglobin: 10.1 g/dL — ABNORMAL LOW (ref 13.0–17.0)
MCH: 30.8 pg (ref 26.0–34.0)
MCHC: 31.3 g/dL (ref 30.0–36.0)
MCV: 98.5 fL (ref 80.0–100.0)
Platelets: 164 10*3/uL (ref 150–400)
RBC: 3.28 MIL/uL — ABNORMAL LOW (ref 4.22–5.81)
RDW: 16.3 % — ABNORMAL HIGH (ref 11.5–15.5)
WBC: 7.9 10*3/uL (ref 4.0–10.5)
nRBC: 0 % (ref 0.0–0.2)

## 2019-08-11 LAB — GLUCOSE, CAPILLARY
Glucose-Capillary: 128 mg/dL — ABNORMAL HIGH (ref 70–99)
Glucose-Capillary: 110 mg/dL — ABNORMAL HIGH (ref 70–99)

## 2019-08-11 LAB — COMPREHENSIVE METABOLIC PANEL
ALT: 6 U/L (ref 0–44)
AST: 10 U/L — ABNORMAL LOW (ref 15–41)
Albumin: 3.3 g/dL — ABNORMAL LOW (ref 3.5–5.0)
Alkaline Phosphatase: 65 U/L (ref 38–126)
Anion gap: 11 (ref 5–15)
BUN: 9 mg/dL (ref 8–23)
CO2: 25 mmol/L (ref 22–32)
Calcium: 9.4 mg/dL (ref 8.9–10.3)
Chloride: 106 mmol/L (ref 98–111)
Creatinine, Ser: 1 mg/dL (ref 0.61–1.24)
GFR calc Af Amer: >60 mL/min (ref >60)
GFR calc non Af Amer: >60 mL/min (ref >60)
Glucose, Bld: 94 mg/dL (ref 70–99)
Potassium: 3.6 mmol/L (ref 3.5–5.1)
Sodium: 142 mmol/L (ref 135–145)
Total Bilirubin: 0.5 mg/dL (ref 0.3–1.2)
Total Protein: 6.6 g/dL (ref 6.5–8.1)

## 2019-08-11 LAB — MAGNESIUM: Magnesium: 1.7 mg/dL (ref 1.7–2.4)

## 2019-08-11 LAB — PHOSPHORUS: Phosphorus: 2 mg/dL — ABNORMAL LOW (ref 2.5–4.6)

## 2019-08-11 MED ORDER — ADULT MULTIVITAMIN W/MINERALS CH: 1.0000 | ORAL_TABLET | Freq: Every day | ORAL | 3 refills | Status: AC

## 2019-08-11 MED ORDER — CYANOCOBALAMIN 1000 MCG PO TABS: 1000.0000 ug | ORAL_TABLET | Freq: Every day | ORAL | 3 refills | Status: AC

## 2019-08-11 MED ORDER — FERROUS SULFATE 325 (65 FE) MG PO TABS: 325.0000 mg | ORAL_TABLET | ORAL | 2 refills | Status: AC

## 2019-08-11 MED ORDER — QUETIAPINE FUMARATE 25 MG PO TABS: 25.0000 mg | ORAL_TABLET | Freq: Two times a day (BID) | ORAL | 0 refills | Status: AC

## 2019-08-11 NOTE — TOC Transition Note (Signed)
Transition of Care [facility]) - CM/SW Discharge Note   Patient Details  Name: John [name] MRN: [id number] Date of Birth: Oct 23, 1946  Transition of Care [facility]) CM/SW Contact:  Lynnell Catalan, RN Phone Number: 08/11/2019, 11:22 AM   Clinical Narrative:    This CM spoke with daughter Willette Cluster this am about pt activity level. Pt walking independently per MD and walked 185feet with supervision per PT note. UHC will not authorize SNF for this activity level. Daughter informed and home health services to continue with [facility]. [facility] rep informed of need for continued services.   Final next level of care: Skilled Nursing Facility Barriers to Discharge: Continued Medical Work up   Patient Goals and CMS Choice Patient states their goals for this hospitalization and ongoing recovery are:: wants pt to get stronger CMS Medicare.gov Compare Post Acute Care list provided to:: Patient Represenative (must comment) Choice offered to / list presented to : Adult Children    Discharge Plan and Services In-house Referral: Clinical Social Work Discharge Planning Services: CM Consult Post Acute Care Choice: Home Health                    HH Arranged: PT, OT, Nurse's Aide, Social Work          Social Determinants of Health (SDOH) Interventions     Readmission Risk Interventions Readmission Risk Prevention Plan 07/09/2019 04/30/2019 04/30/2019  Transportation Screening Complete - Complete  Medication Review Press photographer) Complete - Complete  PCP or Specialist appointment within 3-5 days of discharge - Complete -  [place] or Home Care Consult Complete - Complete  SW Recovery Care/Counseling Consult Complete - -  Palliative Care Screening Not Applicable - Not [place] Not Applicable - Not Applicable  Some recent data might be hidden

## 2019-08-11 NOTE — Plan of Care (Signed)
All goals met for d/c.

## 2019-08-11 NOTE — Discharge Summary (Signed)
Physician Discharge Summary   Patient ID: John [name] MRN: [id number] DOB/AGE: 03/20/1946 73 y.o.  Admit date: 08/01/2019 Discharge date: 08/11/2019  Primary Care Physician:  [name], NP   Recommendations for Outpatient Follow-up:  1. Outpatient follow-up with PCP recommended 2. Please note beta-blocker, clonidine, diuretics currently held.  Will need orthostatic vital signs at follow-up and restart medications/diuretics as needed outpatient.  Home Health: Home health PT OT, RN, HHA Equipment/Devices:   Discharge Condition: stable  CODE STATUS: FULL  Diet recommendation: Regular diet   Discharge Diagnoses:    Acute metabolic encephalopathy in the setting of advanced dementia Orthostatic hypotension Acute kidney injury Hypokalemia Hypomagnesemia Hypophosphatemia generalized debility with fall Chronic diastolic CHF, pulmonary hypertension Diabetes mellitus type 2, NIDDM Anemia chronic disease History of gout Hypertension   Consults: None    Allergies:   Allergies  Allergen Reactions  . Levaquin [Levofloxacin In D5w] Other (See Comments)    Unknown rxn  . Lisinopril Swelling     DISCHARGE MEDICATIONS: Allergies as of 08/01/2019      Reactions   Levaquin [levofloxacin In D5w] Other (See Comments)   Unknown rxn   Lisinopril Swelling      Medication List    ASK your doctor about these medications   albuterol 108 (90 Base) MCG/ACT inhaler Commonly known as: ProAir HFA INHALE 2 PUFFS BY MOUTH EVERY 4 HOURS AS NEEDED FOR WHEEZING   allopurinol 300 MG tablet Commonly known as: ZYLOPRIM Take 300 mg by mouth daily.   amLODipine 10 MG tablet Commonly known as: NORVASC Take 10 mg by mouth daily.   atorvastatin 20 MG tablet Commonly known as: LIPITOR Take 1 tablet (20 mg total) by mouth daily.   folic acid 1 MG tablet Commonly known as: FOLVITE Take 1 tablet (1 mg total) by mouth daily.   metFORMIN 1000 MG tablet Commonly known as:  GLUCOPHAGE Take 0.5 tablets (500 mg total) by mouth 2 (two) times daily.   naproxen 500 MG tablet Commonly known as: NAPROSYN Take 500 mg by mouth 2 (two) times daily as needed for moderate pain.   pantoprazole 40 MG tablet Commonly known as: PROTONIX Take 1 tablet (40 mg total) by mouth 2 (two) times daily before a meal.   senna-docusate 8.6-50 MG tablet Commonly known as: Senokot-S Take 1 tablet by mouth at bedtime.   sucralfate 1 g tablet Commonly known as: CARAFATE Take 1 g by mouth 4 (four) times daily.   Vitamin D (Ergocalciferol) 1.25 MG (50000 UT) Caps capsule Commonly known as: DRISDOL Take 50,000 Units by mouth every Friday.   Vitamin D3 50 MCG (2000 UT) capsule Take 2,000 Units by mouth daily.        Brief H and P: For complete details please refer to admission H and P, but in brief *73 year old male with history of advanced dementia, recurrent lung cancer, diastolic CHF, pulmonary HTN, CAD, HTN, COPD not on oxygen, DM-2 and duodenal ulcer disease but his low blood pressure, altered mental status, poor p.o. intake and found to have orthostatic hypotension and severe hypokalemia and hypomagnesemia.  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  Restarted radiotherapy to recurrent lesion of RLL on 08/06/2019.  He is under the care of Dr. Tammi Klippel.   Hospital Course:   Acute metabolic encephalopathy in the setting of dementia -Likely mental status worsened due to dehydration due to poor intake, orthostasis, significant  electrolyte derangements -Currently appears to be back to baseline, alert and oriented -Ambulating without any difficulty, PT OT evaluation recommended home health under supervision -Continue Seroquel  Orthostatic hypotension -BP is soft, patient has significant orthostasis on standing up. -Hold beta-blocker, clonidine and  diuretics which could contribute and dehydration at the time of admission -Continue only amlodipine, place TED hoses  Acute kidney injury -Possibly due to hypotension, dehydration, naproxen, Lasix -Creatinine 1.48 at the time of admission  Hypokalemia, hypomagnesemia, hypophosphatemia -Potassium 2.5 at the time of admission, magnesium 1.0, phosphorus 2.1 -Likely due to poor oral intake, possibly hypokalemia from diuretics -Potassium 3.6 at the time of discharge, phosphorus 2.0, magnesium 1.7 -Diuretics held   Generalized debility Evaluation on 8/11 recommended skilled nursing facility, however patient ambulating today without any assistive device -Repeat PT evaluation done and confirmed patient has much more improved mobility, recommended continue home health PT OT, supervision 24 hours.  Discussed with the patient's family who agrees.  Recommend lung cancer -History of recurrent non-small cell lung CA, squamous cell carcinoma diagnosed in 2016, status post XRT, reoccurrence in May 2020 -Currently under care of Dr. Tammi Klippel, restarted XRT to recurrent lesion of RLL on 8/7  Chronic diastolic CHF, pulmonary hypertension -Currently stable, euvolemic. -BP soft, holding diuretics -Outpatient follow-up with his PCP  Chronic COPD Stable, no wheezing  Diabetes mellitus type 2, NIDDM -Continue metformin at the time of discharge.  Anemia of chronic disease Hemoglobin at baseline, continue to monitor  History of gout Continue allopurinol    Day of Discharge S: Ambulating in the room, does not feel any wobbly.  Overall feels much improved from admission.  BP still somewhat soft, asymptomatic.  BP (!) 108/58   Pulse (!) 54   Temp 98.5 F (36.9 C) (Oral)   Resp 18   Ht 5\' 9"  (1.753 m)   Wt 79.4 kg   SpO2 (!) 89%   BMI 25.84 kg/m   Physical Exam: General: Alert and awake oriented x3 not in any acute distress. HEENT: anicteric sclera, pupils reactive to light and  accommodation CVS: S1-S2 clear no murmur rubs or gallops Chest: clear to auscultation bilaterally, no wheezing rales or rhonchi Abdomen: soft nontender, nondistended, normal bowel sounds Extremities: no cyanosis, clubbing or edema noted bilaterally Neuro: Cranial nerves II-XII intact, no focal neurological deficits   The results of significant diagnostics from this hospitalization (including imaging, microbiology, ancillary and laboratory) are listed below for reference.      Procedures/Studies:  Ct Head Wo Contrast  Result Date: 08/01/2019 CLINICAL DATA:  Fall out of bed EXAM: CT HEAD WITHOUT CONTRAST CT CERVICAL SPINE WITHOUT CONTRAST TECHNIQUE: Multidetector CT imaging of the head and cervical spine was performed following the standard protocol without intravenous contrast. Multiplanar CT image reconstructions of the cervical spine were also generated. COMPARISON:  07/04/2019 FINDINGS: CT HEAD FINDINGS Brain: There is atrophy and chronic small vessel disease changes. No acute intracranial abnormality. Specifically, no hemorrhage, hydrocephalus, mass lesion, acute infarction, or significant intracranial injury. Vascular: No hyperdense vessel or unexpected calcification. Skull: No acute calvarial abnormality. Sinuses/Orbits: Mucosal thickening within the maxillary sinuses. No air-fluid levels. Other: None CT CERVICAL SPINE FINDINGS Alignment: No subluxation Skull base and vertebrae: No acute fracture. No primary bone lesion or focal pathologic process. Soft tissues and spinal canal: No prevertebral fluid or swelling. No visible canal hematoma. Disc levels: Diffuse degenerative disc disease, most pronounced C4-5 through C6-7. Mild bilateral degenerative facet disease. Upper chest: No acute findings. Other: None IMPRESSION: Atrophy, chronic microvascular  disease. No acute intracranial abnormality. Cervical spondylosis. No acute bony abnormality in the cervical spine. Electronically Signed   By: Rolm Baptise M.D.   On: 08/01/2019 03:26   Ct Cervical Spine Wo Contrast  Result Date: 08/01/2019 CLINICAL DATA:  Fall out of bed EXAM: CT HEAD WITHOUT CONTRAST CT CERVICAL SPINE WITHOUT CONTRAST TECHNIQUE: Multidetector CT imaging of the head and cervical spine was performed following the standard protocol without intravenous contrast. Multiplanar CT image reconstructions of the cervical spine were also generated. COMPARISON:  07/04/2019 FINDINGS: CT HEAD FINDINGS Brain: There is atrophy and chronic small vessel disease changes. No acute intracranial abnormality. Specifically, no hemorrhage, hydrocephalus, mass lesion, acute infarction, or significant intracranial injury. Vascular: No hyperdense vessel or unexpected calcification. Skull: No acute calvarial abnormality. Sinuses/Orbits: Mucosal thickening within the maxillary sinuses. No air-fluid levels. Other: None CT CERVICAL SPINE FINDINGS Alignment: No subluxation Skull base and vertebrae: No acute fracture. No primary bone lesion or focal pathologic process. Soft tissues and spinal canal: No prevertebral fluid or swelling. No visible canal hematoma. Disc levels: Diffuse degenerative disc disease, most pronounced C4-5 through C6-7. Mild bilateral degenerative facet disease. Upper chest: No acute findings. Other: None IMPRESSION: Atrophy, chronic microvascular disease. No acute intracranial abnormality. Cervical spondylosis. No acute bony abnormality in the cervical spine. Electronically Signed   By: Rolm Baptise M.D.   On: 08/01/2019 03:26   Ct Biopsy  Result Date: 07/30/2019 CLINICAL DATA:  Hypermetabolic right lower lobe lung mass EXAM: CT GUIDED CORE BIOPSY OF RIGHT LOWER LOBE LUNG MASS ANESTHESIA/SEDATION: Intravenous Fentanyl 66mcg and Versed 1mg  were administered as conscious sedation during continuous monitoring of the patient's level of consciousness and physiological / cardiorespiratory status by the radiology RN, with a total moderate sedation time of  12 minutes. PROCEDURE: The procedure risks, benefits, and alternatives were explained to the patient. Questions regarding the procedure were encouraged and answered. The patient understands and consents to the procedure. Select axial scans through the thorax were obtained. The right lower lobe lesion was localized and an appropriate skin entry site was determined and marked. The operative field was prepped with chlorhexidinein a sterile fashion, and a sterile drape was applied covering the operative field. A sterile gown and sterile gloves were used for the procedure. Local anesthesia was provided with 1% Lidocaine. Under CT fluoroscopic guidance, a 17 gauge trocar needle was advanced to the margin of the lesion. Once needle tip position was confirmed, coaxial 18-gauge core biopsy samples were obtained, submitted in formalin to surgical pathology. The guide needle was removed. Postprocedure scans show no hemorrhage or pneumothorax. The patient tolerated the procedure well. COMPLICATIONS: None immediate FINDINGS: Right lower lobe pleural-based lung mass was again localized. Representative core biopsy samples obtained as above. IMPRESSION: 1. Technically successful CT-guided core biopsy, right lower lobe lung mass. Electronically Signed   By: Lucrezia Europe M.D.   On: 07/30/2019 13:18   Dg Chest Portable 1 View  Result Date: 08/09/2019 CLINICAL DATA:  Short of breath. Needle biopsy right lung mass 07/30/2019. EXAM: PORTABLE CHEST 1 VIEW COMPARISON:  CT chest 04/21/2019, chest x-ray 07/12/2019 FINDINGS: Cardiac enlargement without heart failure. Improved aeration in the lung bases bilaterally. No residual infiltrate Right lower lobe mass lesion again identified.  No pneumothorax. IMPRESSION: Right lower lobe mass lesion. Interval clearing of bibasilar airspace disease since the prior chest x-ray. Electronically Signed   By: Franchot Gallo M.D.   On: 08/09/2019 19:53       LAB RESULTS: Basic Metabolic  Panel: Recent Labs  Lab 08/10/19 0816  08/11/19 0338  NA 142  --  142  K 3.2*  --  3.6  CL 104  --  106  CO2 28  --  25  GLUCOSE 116*  --  94  BUN 12  --  9  CREATININE 1.18  --  1.00  CALCIUM 9.3  --  9.4  MG  --    < > 1.7  PHOS  --   --  2.0*   < > = values in this interval not displayed.   Liver Function Tests: Recent Labs  Lab 08/05/19 0925 08/11/19 0338  AST 8* 10*  ALT 7 6  ALKPHOS 73 65  BILITOT 0.4 0.5  PROT 6.7 6.6  ALBUMIN 3.2* 3.3*   No results for input(s): LIPASE, AMYLASE in the last 168 hours. No results for input(s): AMMONIA in the last 168 hours. CBC: Recent Labs  Lab 08/09/19 2011 08/10/19 0541 08/11/19 0338  WBC 7.2 8.0 7.9  NEUTROABS 5.3  --   --   HGB 8.4* 9.0* 10.1*  HCT 27.4* 29.0* 32.3*  MCV 99.3 99.3 98.5  PLT 153 172 164   Cardiac Enzymes: No results for input(s): CKTOTAL, CKMB, CKMBINDEX, TROPONINI in the last 168 hours. BNP: Invalid input(s): POCBNP CBG: Recent Labs  Lab 08/11/19 0758 08/11/19 1143  GLUCAP 128* 110*      Disposition and Follow-up:    DISPOSITION: Home with home health   [place]    [name], NP.   Specialty: Family Medicine Why: As needed Contact information: [address] [place] [address] [phone]            Time coordinating discharge:  35 minutes  Signed:   Estill Cotta M.D. [facility] 08/11/2019, 5:09 PM

## 2019-08-11 NOTE — Progress Notes (Signed)
PT Cancellation Note  Patient Details Name: John [name] MRN: [id number] DOB: [birth year]   Cancelled Treatment:    Reason Eval/Treat Not Completed: Pt evaluated on 8/11-please see eval and recommendations. Thanks.   Weston Anna, PT Acute Rehabilitation Services Pager: [phone] Office: [phone]

## 2019-08-11 NOTE — Progress Notes (Signed)
Occupational therapy Note  New OT order noted.  Pt was evaluated by OT 8/11 - see evaluation and recommendations.  OT will continue to follow with recommended frequency of minimum  2x/wk.  Thank you.  Lucille Passy, OTR/L Runner, broadcasting/film/video [phone] Office [phone]

## 2019-08-11 NOTE — Progress Notes (Signed)
Pt alert, ambulating by himself.  He did not eat very much today but he was ready to go home.  D/C instructions were given over the phone to his sister.  Pt was d.cd home.

## 2019-08-11 NOTE — Progress Notes (Signed)
Physical Therapy Treatment Patient Details Name: John [name] MRN: [id number] DOB: [birth year] Today's Date: 08/11/2019    History of Present Illness 73 yo male admitted with hypokalemia, hypotension, noncompliance with wearing O2. Hx of dementia, DM, CHF, lung ca, MI, syncope, pulm HTN    PT Comments    Asked to see pt again on today. Pt participated well. He was able to walk without an assistive device on today. He has walkers available at home if he needs them.   Follow Up Recommendations  Supervision/Assistance - 24 hour (Family requesting SNF. SNF not an option)     Equipment Recommendations  None recommended by PT    Recommendations for Other Services       Precautions / Restrictions Precautions Precautions: Fall Restrictions Weight Bearing Restrictions: No    Mobility  Bed Mobility Overal bed mobility: Independent                Transfers Overall transfer level: Independent     Sit to Stand: Modified independent (Device/Increase time)            Ambulation/Gait Ambulation/Gait assistance: Supervision Gait Distance (Feet): 250 Feet Assistive device: None Gait Pattern/deviations: Step-through pattern     General Gait Details: No LOB. Mildly unsteady intermittently   Stairs             Wheelchair Mobility    Modified Rankin (Stroke Patients Only)       Balance Overall balance assessment: Mild deficits observed, not formally tested                                          Cognition Arousal/Alertness: Awake/alert Behavior During Therapy: WFL for tasks assessed/performed Overall Cognitive Status: Impaired/Different from baseline Area of Impairment: Memory                     Memory: Decreased short-term memory                Exercises      General Comments        Pertinent Vitals/Pain Pain Assessment: No/denies pain    Home Living                      Prior Function             PT Goals (current goals can now be found in the care plan section) Progress towards PT goals: Progressing toward goals    Frequency    Min 2X/week      PT Plan Discharge plan needs to be updated    Co-evaluation              AM-PAC PT "6 Clicks" Mobility   Outcome Measure  Help needed turning from your back to your side while in a flat bed without using bedrails?: None Help needed moving from lying on your back to sitting on the side of a flat bed without using bedrails?: None Help needed moving to and from a bed to a chair (including a wheelchair)?: None Help needed standing up from a chair using your arms (e.g., wheelchair or bedside chair)?: None Help needed to walk in hospital room?: None Help needed climbing 3-5 steps with a railing? : A Little 6 Click Score: 23    End of Session   Activity Tolerance: Patient tolerated treatment well Patient left: in bed;with call  bell/phone within reach   PT Visit Diagnosis: Unsteadiness on feet (R26.81)     Time: 9937-1696 PT Time Calculation (min) (ACUTE ONLY): 8 min  Charges:  $Gait Training: 8-22 mins                        Weston Anna, PT Acute Rehabilitation Services Pager: [phone] Office: [phone]

## 2019-08-12 ENCOUNTER — Ambulatory Visit: Payer: Medicare Other

## 2019-08-12 ENCOUNTER — Ambulatory Visit
Admission: RE | Admit: 2019-08-12 | Discharge: 2019-08-12 | Disposition: A | Discharge status: Home / Self Care | Payer: Medicare Other | Source: Ambulatory Visit | Attending: Radiation Oncology | Admitting: Radiation Oncology

## 2019-08-12 ENCOUNTER — Other Ambulatory Visit: Payer: Self-pay

## 2019-08-12 DIAGNOSIS — Z510 Encounter for antineoplastic radiation therapy: Secondary | ICD-10-CM | POA: Diagnosis not present

## 2019-08-12 DIAGNOSIS — C3431 Malignant neoplasm of lower lobe, right bronchus or lung: Secondary | ICD-10-CM | POA: Diagnosis present

## 2019-08-12 NOTE — Discharge Summary (Signed)
Physician Discharge Summary   Patient ID: John [name] MRN: [id number] DOB/AGE: 75-24-1947 75 y.o.  Admit date: 08/09/2019 Discharge date: 08/12/2019  Primary Care Physician:  [name], NP   Recommendations for Outpatient Follow-up:  1. Follow up with PCP in 1-2 weeks  Home Health: Home health PT Equipment/Devices:   Discharge Condition: stable  CODE STATUS: FULL  Diet recommendation: Heart healthy diet   Discharge Diagnoses:   Orthostatic hypotension . Hypokalemia . Chronic diastolic CHF (congestive heart failure) ([place]) . HTN (hypertension) . Chronic respiratory failure with hypoxia ([place]) . COPD II/III with reversibility  . Primary cancer of right lower lobe of lung ([place]) . Acute kidney injury on CKD (chronic kidney disease), stage III ([place]) . Symptomatic anemia . Acute metabolic encephalopathy in the setting of advanced dementia Hypokalemia, hypomagnesemia, hypophosphatemia [place], generalized debility  Consults: None    Allergies:   Allergies  Allergen Reactions  . Levaquin [Levofloxacin In D5w] Other (See Comments)    Unknown rxn  . Lisinopril Swelling     DISCHARGE MEDICATIONS: Allergies as of 08/11/2019      Reactions   Levaquin [levofloxacin In D5w] Other (See Comments)   Unknown rxn   Lisinopril Swelling      Medication List    STOP taking these medications   bisoprolol 5 MG tablet Commonly known as: ZEBETA   cloNIDine 0.1 MG tablet Commonly known as: CATAPRES   furosemide 20 MG tablet Commonly known as: LASIX     TAKE these medications   albuterol 108 (90 Base) MCG/ACT inhaler Commonly known as: ProAir HFA INHALE 2 PUFFS BY MOUTH EVERY 4 HOURS AS NEEDED FOR WHEEZING What changed:   how much to take  how to take this  when to take this  reasons to take this  additional instructions   allopurinol 300 MG tablet Commonly known as: ZYLOPRIM Take 300 mg by mouth daily.   amLODipine 10 MG tablet Commonly known  as: NORVASC Take 10 mg by mouth daily.   atorvastatin 20 MG tablet Commonly known as: LIPITOR Take 1 tablet (20 mg total) by mouth daily.   cyanocobalamin 1000 MCG tablet Take 1 tablet (1,000 mcg total) by mouth daily.   ferrous sulfate 325 (65 FE) MG tablet Take 1 tablet (325 mg total) by mouth every Monday, Wednesday, and Friday.   folic acid 1 MG tablet Commonly known as: FOLVITE Take 1 tablet (1 mg total) by mouth daily.   metFORMIN 1000 MG tablet Commonly known as: GLUCOPHAGE Take 0.5 tablets (500 mg total) by mouth 2 (two) times daily.   multivitamin with minerals Tabs tablet Take 1 tablet by mouth daily. Over the counter   naproxen 500 MG tablet Commonly known as: NAPROSYN Take 500 mg by mouth 2 (two) times daily as needed for moderate pain.   pantoprazole 40 MG tablet Commonly known as: PROTONIX Take 1 tablet (40 mg total) by mouth 2 (two) times daily before a meal.   QUEtiapine 25 MG tablet Commonly known as: SEROQUEL Take 1 tablet (25 mg total) by mouth 2 (two) times daily.   senna-docusate 8.6-50 MG tablet Commonly known as: Senokot-S Take 1 tablet by mouth at bedtime.   sucralfate 1 g tablet Commonly known as: CARAFATE Take 1 g by mouth 4 (four) times daily.   Symbicort 160-4.5 MCG/ACT inhaler Generic drug: budesonide-formoterol Inhale 2 puffs into the lungs 2 (two) times daily.   Vitamin D (Ergocalciferol) 1.25 MG (50000 UT) Caps capsule Commonly known as: DRISDOL Take 50,000 Units  by mouth every Friday.   Vitamin D3 50 MCG (2000 UT) capsule Take 2,000 Units by mouth daily.        Brief H and P: For complete details please refer to admission H and P, but in brief *73 year old male with history of advanced dementia, recurrent lung cancer, diastolic CHF, pulmonary HTN, CAD, HTN, COPD not on oxygen, DM-2 and duodenal ulcer disease but his low blood pressure, altered mental status, poor p.o. intake and found to have orthostatic hypotension and  severe hypokalemia and hypomagnesemia.  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  Restarted radiotherapy to recurrent lesion of RLL on 08/06/2019.  He is under the care of Dr. Tammi Klippel.   Hospital Course:   Acute metabolic encephalopathy in patient with advanced dementia:  -Possibly secondary to dehydration, poor oral intake, significant electrolyte derangements -Encephalopathy seems to have improved, currently at baseline, oriented -Continue low-dose Seroquel -PT OT reevaluation done on the day of discharge, patient ambulating, recommended home health PT  Orthostatic hypotension -Significant orthostasis at home per EMS, patient is on beta-blocker, clonidine, diuretics and was dehydrated at the time of admission -Held bisoprolol, clonidine, furosemide Recommended TED hoses, continue amlodipine for now.  Recommend goal SBP between 140-150 to avoid plummeting of BP on standing up  Acute kidney injury on CKD stage III -Improved, likely prerenal insult, as well as on naproxen, Lasix -Creatinine 1.5 at the time of admission, improved to 1.0 at the time of discharge -Continue to hold NSAIDs, Lasix  Generalized debility, fall -Repeat PT OT evaluation done, patient ambulating without any difficulty, recommended home health PT  Recurrent lung CA -Per last oncology note, patient with history of recurrent non-small cell lung CA, squamous cell carcinoma diagnosed in 2016 status post XRT, restarted radiotherapy to rectal lesion on 8/7, continue treatment.  Chronic diastolic CHF -Currently euvolemic, continue to hold diuretics, follow-up outpatient with PCP and readjust medications  Chronic COPD -Stable, no wheezing  Diabetes mellitus type 2, non-insulin-dependent -CBGs stable, continue metformin  Anemia of chronic disease -Hemoglobin at  baseline  History of gout Continue home allopurinol  Essential hypertension -BP currently stable, for now continue amlodipine    Day of Discharge S: No acute issues, ambulating, wants to go home  BP (!) 150/76 (BP Location: Left Arm)   Pulse 94   Temp 97.6 F (36.4 C) (Oral)   Resp 18   Ht 5\' 9"  (1.753 m)   Wt 79.4 kg   SpO2 96%   BMI 25.84 kg/m   Physical Exam: General: Alert and awake oriented x3 not in any acute distress. HEENT: anicteric sclera, pupils reactive to light and accommodation CVS: S1-S2 clear no murmur rubs or gallops Chest: clear to auscultation bilaterally, no wheezing rales or rhonchi Abdomen: soft nontender, nondistended, normal bowel sounds Extremities: no cyanosis, clubbing or edema noted bilaterally Neuro: Cranial nerves II-XII intact, no focal neurological deficits   The results of significant diagnostics from this hospitalization (including imaging, microbiology, ancillary and laboratory) are listed below for reference.      Procedures/Studies:  Ct Head Wo Contrast  Result Date: 08/01/2019 CLINICAL DATA:  Fall out of bed EXAM: CT HEAD WITHOUT CONTRAST CT CERVICAL SPINE WITHOUT CONTRAST TECHNIQUE: Multidetector CT imaging of the head and cervical spine was performed following the standard protocol without intravenous contrast. Multiplanar CT image reconstructions of the cervical spine were also generated. COMPARISON:  07/04/2019 FINDINGS: CT  HEAD FINDINGS Brain: There is atrophy and chronic small vessel disease changes. No acute intracranial abnormality. Specifically, no hemorrhage, hydrocephalus, mass lesion, acute infarction, or significant intracranial injury. Vascular: No hyperdense vessel or unexpected calcification. Skull: No acute calvarial abnormality. Sinuses/Orbits: Mucosal thickening within the maxillary sinuses. No air-fluid levels. Other: None CT CERVICAL SPINE FINDINGS Alignment: No subluxation Skull base and vertebrae: No acute  fracture. No primary bone lesion or focal pathologic process. Soft tissues and spinal canal: No prevertebral fluid or swelling. No visible canal hematoma. Disc levels: Diffuse degenerative disc disease, most pronounced C4-5 through C6-7. Mild bilateral degenerative facet disease. Upper chest: No acute findings. Other: None IMPRESSION: Atrophy, chronic microvascular disease. No acute intracranial abnormality. Cervical spondylosis. No acute bony abnormality in the cervical spine. Electronically Signed   By: Rolm Baptise M.D.   On: 08/01/2019 03:26   Ct Cervical Spine Wo Contrast  Result Date: 08/01/2019 CLINICAL DATA:  Fall out of bed EXAM: CT HEAD WITHOUT CONTRAST CT CERVICAL SPINE WITHOUT CONTRAST TECHNIQUE: Multidetector CT imaging of the head and cervical spine was performed following the standard protocol without intravenous contrast. Multiplanar CT image reconstructions of the cervical spine were also generated. COMPARISON:  07/04/2019 FINDINGS: CT HEAD FINDINGS Brain: There is atrophy and chronic small vessel disease changes. No acute intracranial abnormality. Specifically, no hemorrhage, hydrocephalus, mass lesion, acute infarction, or significant intracranial injury. Vascular: No hyperdense vessel or unexpected calcification. Skull: No acute calvarial abnormality. Sinuses/Orbits: Mucosal thickening within the maxillary sinuses. No air-fluid levels. Other: None CT CERVICAL SPINE FINDINGS Alignment: No subluxation Skull base and vertebrae: No acute fracture. No primary bone lesion or focal pathologic process. Soft tissues and spinal canal: No prevertebral fluid or swelling. No visible canal hematoma. Disc levels: Diffuse degenerative disc disease, most pronounced C4-5 through C6-7. Mild bilateral degenerative facet disease. Upper chest: No acute findings. Other: None IMPRESSION: Atrophy, chronic microvascular disease. No acute intracranial abnormality. Cervical spondylosis. No acute bony abnormality in the  cervical spine. Electronically Signed   By: Rolm Baptise M.D.   On: 08/01/2019 03:26   Ct Biopsy  Result Date: 07/30/2019 CLINICAL DATA:  Hypermetabolic right lower lobe lung mass EXAM: CT GUIDED CORE BIOPSY OF RIGHT LOWER LOBE LUNG MASS ANESTHESIA/SEDATION: Intravenous Fentanyl 57mcg and Versed 1mg  were administered as conscious sedation during continuous monitoring of the patient's level of consciousness and physiological / cardiorespiratory status by the radiology RN, with a total moderate sedation time of 12 minutes. PROCEDURE: The procedure risks, benefits, and alternatives were explained to the patient. Questions regarding the procedure were encouraged and answered. The patient understands and consents to the procedure. Select axial scans through the thorax were obtained. The right lower lobe lesion was localized and an appropriate skin entry site was determined and marked. The operative field was prepped with chlorhexidinein a sterile fashion, and a sterile drape was applied covering the operative field. A sterile gown and sterile gloves were used for the procedure. Local anesthesia was provided with 1% Lidocaine. Under CT fluoroscopic guidance, a 17 gauge trocar needle was advanced to the margin of the lesion. Once needle tip position was confirmed, coaxial 18-gauge core biopsy samples were obtained, submitted in formalin to surgical pathology. The guide needle was removed. Postprocedure scans show no hemorrhage or pneumothorax. The patient tolerated the procedure well. COMPLICATIONS: None immediate FINDINGS: Right lower lobe pleural-based lung mass was again localized. Representative core biopsy samples obtained as above. IMPRESSION: 1. Technically successful CT-guided core biopsy, right lower lobe lung mass. Electronically Signed  By: Lucrezia Europe M.D.   On: 07/30/2019 13:18   Dg Chest Portable 1 View  Result Date: 08/09/2019 CLINICAL DATA:  Short of breath. Needle biopsy right lung mass 07/30/2019.  EXAM: PORTABLE CHEST 1 VIEW COMPARISON:  CT chest 04/21/2019, chest x-ray 07/12/2019 FINDINGS: Cardiac enlargement without heart failure. Improved aeration in the lung bases bilaterally. No residual infiltrate Right lower lobe mass lesion again identified.  No pneumothorax. IMPRESSION: Right lower lobe mass lesion. Interval clearing of bibasilar airspace disease since the prior chest x-ray. Electronically Signed   By: Franchot Gallo M.D.   On: 08/09/2019 19:53       LAB RESULTS: Basic Metabolic Panel: Recent Labs  Lab 08/10/19 0816  08/11/19 0338  NA 142  --  142  K 3.2*  --  3.6  CL 104  --  106  CO2 28  --  25  GLUCOSE 116*  --  94  BUN 12  --  9  CREATININE 1.18  --  1.00  CALCIUM 9.3  --  9.4  MG  --    < > 1.7  PHOS  --   --  2.0*   < > = values in this interval not displayed.   Liver Function Tests: Recent Labs  Lab 08/11/19 0338  AST 10*  ALT 6  ALKPHOS 65  BILITOT 0.5  PROT 6.6  ALBUMIN 3.3*   No results for input(s): LIPASE, AMYLASE in the last 168 hours. No results for input(s): AMMONIA in the last 168 hours. CBC: Recent Labs  Lab 08/09/19 2011 08/10/19 0541 08/11/19 0338  WBC 7.2 8.0 7.9  NEUTROABS 5.3  --   --   HGB 8.4* 9.0* 10.1*  HCT 27.4* 29.0* 32.3*  MCV 99.3 99.3 98.5  PLT 153 172 164   Cardiac Enzymes: No results for input(s): CKTOTAL, CKMB, CKMBINDEX, TROPONINI in the last 168 hours. BNP: Invalid input(s): POCBNP CBG: Recent Labs  Lab 08/11/19 0758 08/11/19 1143  GLUCAP 128* 110*      Disposition and Follow-up: Discharge Instructions    Diet general   Complete by: As directed    Increase activity slowly   Complete by: As directed        DISPOSITION: Home health PT [place], [facility] Follow up.   Specialty: Home Health Services Contact information: [place] [address] [phone]        [name], NP. Schedule an  appointment as soon as possible for a visit in 2 week(s).   Specialty: Family Medicine Contact information: [address] [place] [address] [phone]            Time coordinating discharge:  25 minutes  Signed:   Estill Cotta M.D. [facility] 08/12/2019, 1:39 PM

## 2019-08-13 ENCOUNTER — Ambulatory Visit
Admission: RE | Admit: 2019-08-13 | Discharge: 2019-08-13 | Disposition: A | Discharge status: Home / Self Care | Payer: Medicare Other | Source: Ambulatory Visit | Attending: Radiation Oncology | Admitting: Radiation Oncology

## 2019-08-13 DIAGNOSIS — C3431 Malignant neoplasm of lower lobe, right bronchus or lung: Secondary | ICD-10-CM | POA: Diagnosis not present

## 2019-08-16 ENCOUNTER — Ambulatory Visit
Admission: RE | Admit: 2019-08-16 | Discharge: 2019-08-16 | Disposition: A | Discharge status: Home / Self Care | Payer: Medicare Other | Source: Ambulatory Visit | Attending: Radiation Oncology | Admitting: Radiation Oncology

## 2019-08-16 ENCOUNTER — Other Ambulatory Visit: Payer: Self-pay

## 2019-08-16 ENCOUNTER — Inpatient Hospital Stay: Payer: Medicare Other | Admitting: Nutrition

## 2019-08-16 DIAGNOSIS — C3431 Malignant neoplasm of lower lobe, right bronchus or lung: Secondary | ICD-10-CM | POA: Diagnosis not present

## 2019-08-16 NOTE — Progress Notes (Signed)
73 year old male diagnosed with recurrent non-small cell lung cancer in June 2016. He is a patient of Dr. Julien Nordmann and Dr. Tammi Klippel.  Past medical history includes vitamin B12 deficiency, CHF, COPD, diabetes, folate deficiency, gout, hyperlipidemia, hypertension, MI, and pulmonary hypertension.  Medications include Lipitor, vitamin D3, Lasix, Glucophage, Protonix, Seroquel, Senokot, and Carafate.  Labs include albumin 3.3 and phosphorus 2.0 on August 12.  Height: 69 inches. Weight: 175 pounds. Usual body weight: 190 pounds per patient. BMI: 25.84.  Patient reports he has a very poor appetite. He admits to 15 pound weight loss. Reports sometimes he eats well and sometimes he does not. He drinks orange juice or has cereal with milk in the morning.   He has never tried oral nutrition supplements.  Nutrition diagnosis:  Unintended weight loss related to recurrent cancer as evidenced by 15 pound weight loss from usual body weight.  Intervention: Educated patient to consume small frequent meals and snacks. Educated him on high-calorie, high-protein foods. Recommended he consume oral nutrition supplements 3 times daily between meals. Provided samples.  Gave patient fact sheets.  Teach back method used.  Monitoring, evaluation, goals: Patient will tolerate increased calories and protein to minimize weight loss.  Next visit: Patient will follow-up as needed.  **Disclaimer: This note was dictated with voice recognition software. Similar sounding words can inadvertently be transcribed and this note may contain transcription errors which may not have been corrected upon publication of note.**

## 2019-08-17 ENCOUNTER — Ambulatory Visit
Admission: RE | Admit: 2019-08-17 | Discharge: 2019-08-17 | Disposition: A | Discharge status: Home / Self Care | Payer: Medicare Other | Source: Ambulatory Visit | Attending: Radiation Oncology | Admitting: Radiation Oncology

## 2019-08-17 ENCOUNTER — Other Ambulatory Visit: Payer: Self-pay

## 2019-08-17 DIAGNOSIS — C3431 Malignant neoplasm of lower lobe, right bronchus or lung: Secondary | ICD-10-CM | POA: Diagnosis not present

## 2019-08-18 ENCOUNTER — Ambulatory Visit
Admission: RE | Admit: 2019-08-18 | Discharge: 2019-08-18 | Disposition: A | Discharge status: Home / Self Care | Payer: Medicare Other | Source: Ambulatory Visit | Attending: Radiation Oncology | Admitting: Radiation Oncology

## 2019-08-18 ENCOUNTER — Other Ambulatory Visit: Payer: Self-pay

## 2019-08-18 DIAGNOSIS — C3431 Malignant neoplasm of lower lobe, right bronchus or lung: Secondary | ICD-10-CM | POA: Diagnosis not present

## 2019-08-19 ENCOUNTER — Other Ambulatory Visit: Payer: Self-pay

## 2019-08-19 ENCOUNTER — Ambulatory Visit
Admission: RE | Admit: 2019-08-19 | Discharge: 2019-08-19 | Disposition: A | Discharge status: Home / Self Care | Payer: Medicare Other | Source: Ambulatory Visit | Attending: Radiation Oncology | Admitting: Radiation Oncology

## 2019-08-19 DIAGNOSIS — C3431 Malignant neoplasm of lower lobe, right bronchus or lung: Secondary | ICD-10-CM | POA: Diagnosis not present

## 2019-08-20 ENCOUNTER — Ambulatory Visit
Admission: RE | Admit: 2019-08-20 | Discharge: 2019-08-20 | Disposition: A | Discharge status: Home / Self Care | Payer: Medicare Other | Source: Ambulatory Visit | Attending: Radiation Oncology | Admitting: Radiation Oncology

## 2019-08-20 ENCOUNTER — Other Ambulatory Visit: Payer: Self-pay

## 2019-08-20 DIAGNOSIS — C3431 Malignant neoplasm of lower lobe, right bronchus or lung: Secondary | ICD-10-CM | POA: Diagnosis not present

## 2019-08-23 ENCOUNTER — Ambulatory Visit
Admission: RE | Admit: 2019-08-23 | Discharge: 2019-08-23 | Disposition: A | Discharge status: Home / Self Care | Payer: Medicare Other | Source: Ambulatory Visit | Attending: Radiation Oncology | Admitting: Radiation Oncology

## 2019-08-23 ENCOUNTER — Other Ambulatory Visit: Payer: Self-pay

## 2019-08-23 ENCOUNTER — Telehealth: Payer: Self-pay | Admitting: Medical Oncology

## 2019-08-23 DIAGNOSIS — C3431 Malignant neoplasm of lower lobe, right bronchus or lung: Secondary | ICD-10-CM | POA: Diagnosis not present

## 2019-08-23 NOTE — Telephone Encounter (Signed)
Confirmed appts for this week.

## 2019-08-24 ENCOUNTER — Ambulatory Visit: Payer: Medicare Other

## 2019-08-24 ENCOUNTER — Other Ambulatory Visit: Payer: Self-pay

## 2019-08-24 ENCOUNTER — Ambulatory Visit
Admission: RE | Admit: 2019-08-24 | Discharge: 2019-08-24 | Disposition: A | Discharge status: Home / Self Care | Payer: Medicare Other | Source: Ambulatory Visit | Attending: Radiation Oncology | Admitting: Radiation Oncology

## 2019-08-24 DIAGNOSIS — C3431 Malignant neoplasm of lower lobe, right bronchus or lung: Secondary | ICD-10-CM | POA: Diagnosis not present

## 2019-08-25 ENCOUNTER — Encounter (HOSPITAL_COMMUNITY): Payer: Self-pay

## 2019-08-25 ENCOUNTER — Other Ambulatory Visit: Payer: Self-pay

## 2019-08-25 ENCOUNTER — Emergency Department (HOSPITAL_COMMUNITY): Payer: Medicare Other

## 2019-08-25 ENCOUNTER — Ambulatory Visit: Payer: Medicare Other

## 2019-08-25 ENCOUNTER — Emergency Department (HOSPITAL_COMMUNITY)
Admission: EM | Admit: 2019-08-25 | Discharge: 2019-08-25 | Disposition: A | Discharge status: Home / Self Care | Payer: Medicare Other | Attending: Emergency Medicine | Admitting: Emergency Medicine

## 2019-08-25 DIAGNOSIS — N183 Chronic kidney disease, stage 3 (moderate): Secondary | ICD-10-CM | POA: Insufficient documentation

## 2019-08-25 DIAGNOSIS — F0390 Unspecified dementia without behavioral disturbance: Secondary | ICD-10-CM | POA: Insufficient documentation

## 2019-08-25 DIAGNOSIS — Z87891 Personal history of nicotine dependence: Secondary | ICD-10-CM | POA: Insufficient documentation

## 2019-08-25 DIAGNOSIS — I5032 Chronic diastolic (congestive) heart failure: Secondary | ICD-10-CM | POA: Diagnosis not present

## 2019-08-25 DIAGNOSIS — Z79899 Other long term (current) drug therapy: Secondary | ICD-10-CM | POA: Diagnosis not present

## 2019-08-25 DIAGNOSIS — M25462 Effusion, left knee: Secondary | ICD-10-CM | POA: Diagnosis not present

## 2019-08-25 DIAGNOSIS — I2510 Atherosclerotic heart disease of native coronary artery without angina pectoris: Secondary | ICD-10-CM | POA: Insufficient documentation

## 2019-08-25 DIAGNOSIS — Z7984 Long term (current) use of oral hypoglycemic drugs: Secondary | ICD-10-CM | POA: Diagnosis not present

## 2019-08-25 DIAGNOSIS — J449 Chronic obstructive pulmonary disease, unspecified: Secondary | ICD-10-CM | POA: Diagnosis not present

## 2019-08-25 DIAGNOSIS — I252 Old myocardial infarction: Secondary | ICD-10-CM | POA: Insufficient documentation

## 2019-08-25 DIAGNOSIS — E1122 Type 2 diabetes mellitus with diabetic chronic kidney disease: Secondary | ICD-10-CM | POA: Diagnosis not present

## 2019-08-25 DIAGNOSIS — M25562 Pain in left knee: Secondary | ICD-10-CM | POA: Diagnosis present

## 2019-08-25 DIAGNOSIS — I130 Hypertensive heart and chronic kidney disease with heart failure and stage 1 through stage 4 chronic kidney disease, or unspecified chronic kidney disease: Secondary | ICD-10-CM | POA: Diagnosis not present

## 2019-08-25 MED ORDER — TRAMADOL HCL 50 MG PO TABS: 50.0000 mg | ORAL_TABLET | Freq: Four times a day (QID) | ORAL | 0 refills | Status: DC | PRN

## 2019-08-25 MED ORDER — HYDROCODONE-ACETAMINOPHEN 5-325 MG PO TABS
1.0000 | ORAL_TABLET | Freq: Once | ORAL | Status: AC
  Administered 2019-08-25: 1 via ORAL
  Filled 2019-08-25: qty 1

## 2019-08-25 NOTE — ED Triage Notes (Signed)
Pt states he has been having left knee swelling that has been going on for a while. Pt states this morning he fell on it when getting out of bed to the bathroom.   Pt is supposed to wear O2, but did not bring it with him.   Pt placed portable O2 in lobby.

## 2019-08-25 NOTE — ED Provider Notes (Signed)
[facility] DEPT Provider Note   CSN: [id number] Arrival date & time: 08/25/19  1220     History   Chief Complaint Chief Complaint  Patient presents with  . Knee Injury    HPI John [name] is a 73 y.o. male history of diabetes, hyperlipidemia, hypertension, small cell lung cancer here presenting with a fall.  Patient states that he fell onto the left knee several days ago.  He states that he is using his walker and the left knee became more progressively swollen and painful.  Denies any fevers or chills.  Denies any calf pain or shortness of breath.  Patient was recently admitted for altered mental status and was discharged the following day.  Patient lives at home with his daughter.  I talked to the daughter, who corroborated the same story.  Per the daughter, his mental status is at his baseline and he did not hit his head recently.  He is also on oxygen at home but sometimes did not wear it.  He was noted to be 88% on room air in triage and did not bring his oxygen with him.     The history is provided by the patient and a relative.    Past Medical History:  Diagnosis Date  . B12 deficiency   . CHF (congestive heart failure) ([place])   . COPD (chronic obstructive pulmonary disease) ([facility])   . Diabetes mellitus without complication ([place])   . Folate deficiency   . Gout   . Hyperlipemia   . Hypertension   . 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 MI (myocardial infarction) ([place])   . Pulmonary hypertension [facility])     Patient Active Problem List   Diagnosis Date Noted  . Acute metabolic encephalopathy 52/84/1324  . Goals of care, counseling/discussion 08/05/2019  . Acute renal failure (ARF) ([place]) 07/08/2019  . Hypokalemia 07/08/2019  . Vascular dementia without behavioral disturbance ([place])   . Helicobacter pylori gastritis   . Chronic duodenal ulcer with hemorrhage   . Multiple gastric ulcers   . Acute  esophagitis   . Gastrointestinal hemorrhage 04/20/2019  . Vitamin B12 deficiency 04/20/2019  . Folate deficiency 04/20/2019  . Dehydration 04/20/2019  . Hypotension 04/20/2019  . Melena   . Acute blood loss anemia   . CKD (chronic kidney disease), stage III ([place]) 04/08/2019  . Right lower lobe lung mass 04/08/2019  . Syncope and collapse 04/08/2019  . Symptomatic anemia 04/08/2019  . Occult GI bleeding 04/08/2019  . Hyperglycemia 04/08/2019  . Leukocytosis 04/08/2019  . Syncope 04/08/2019  . Low hemoglobin   . Dyspnea on exertion 08/25/2017  . Bursitis of elbow 03/15/2016  . Obesity (BMI 30-39.9) 12/03/2015  . Primary cancer of right lower lobe of lung ([place]) 06/14/2015  . Solitary pulmonary nodule 03/13/2015  . RVF (right ventricular failure) ([place]) 11/04/2014  . COPD II/III with reversibility  12/19/2012  . Chronic respiratory failure with hypoxia ([place]) 12/18/2012  . Drug-induced hyperglycemia 12/08/2012  . Allergic drug rash 12/06/2012  . Pulmonary HTN ([place]) 03/18/2012  . Abnormal echocardiogram 03/18/2012  . LV dysfunction 03/18/2012  . COPD exacerbation ([place]) 02/22/2012  . Chronic diastolic CHF (congestive heart failure) ([place]) 02/22/2012  . HTN (hypertension) 02/22/2012  . CAD (coronary artery disease) 02/22/2012  . Acute on chronic systolic heart failure ([place]) 02/22/2012    Past Surgical History:  Procedure Laterality Date  . APPENDECTOMY    . BIOPSY  04/21/2019   Procedure: BIOPSY;  Surgeon: Gatha Mayer, MD;  Location: [facility];  Service: Endoscopy;;  . ESOPHAGOGASTRODUODENOSCOPY (EGD) WITH PROPOFOL N/A 04/21/2019   Procedure: ESOPHAGOGASTRODUODENOSCOPY (EGD) WITH PROPOFOL;  Surgeon: Gatha Mayer, MD;  Location: [facility];  Service: Endoscopy;  Laterality: N/A;  . ESOPHAGOGASTRODUODENOSCOPY (EGD) WITH PROPOFOL N/A 04/29/2019   Procedure: ESOPHAGOGASTRODUODENOSCOPY (EGD) WITH PROPOFOL;  Surgeon: Mauri Pole, MD;  Location: [facility];  Service:  Endoscopy;  Laterality: N/A;  . HEMOSTASIS CLIP PLACEMENT  04/29/2019   Procedure: HEMOSTASIS CLIP PLACEMENT;  Surgeon: Mauri Pole, MD;  Location: [facility];  Service: Endoscopy;;  Clip placed as marker not for bleeding control  . IR ANGIOGRAM SELECTIVE EACH ADDITIONAL VESSEL  04/29/2019  . IR ANGIOGRAM SELECTIVE EACH ADDITIONAL VESSEL  04/29/2019  . IR ANGIOGRAM SELECTIVE EACH ADDITIONAL VESSEL  04/29/2019  . IR ANGIOGRAM VISCERAL SELECTIVE  04/29/2019  . IR EMBO ART  VEN HEMORR LYMPH EXTRAV  INC GUIDE ROADMAPPING  04/29/2019  . IR US GUIDE VASC ACCESS RIGHT  04/29/2019  . LUNG BIOPSY          Home Medications    Prior to Admission medications   Medication Sig Start Date End Date Taking? Authorizing Provider  albuterol (PROAIR HFA) 108 (90 BASE) MCG/ACT inhaler INHALE 2 PUFFS BY MOUTH EVERY 4 HOURS AS NEEDED FOR WHEEZING Patient taking differently: Inhale 2 puffs into the lungs every 4 (four) hours as needed for wheezing.  03/13/15  Yes Tanda Rockers, MD  allopurinol (ZYLOPRIM) 300 MG tablet Take 300 mg by mouth daily. 04/07/19  Yes [provider]  amLODipine (NORVASC) 10 MG tablet Take 10 mg by mouth daily. 05/07/19  Yes [provider]  atorvastatin (LIPITOR) 20 MG tablet Take 1 tablet (20 mg total) by mouth daily. 09/26/16  Yes Tanda Rockers, MD  Cholecalciferol (VITAMIN D3) 50 MCG (2000 UT) capsule Take 2,000 Units by mouth daily.  05/07/19  Yes [provider]  ferrous sulfate 325 (65 FE) MG tablet Take 1 tablet (325 mg total) by mouth every Monday, Wednesday, and Friday. 08/11/19 03/08/20 Yes Rai, Ripudeep K, MD  folic acid (FOLVITE) 1 MG tablet Take 1 tablet (1 mg total) by mouth daily. 04/09/19 04/08/20 Yes Donne Hazel, MD  metFORMIN (GLUCOPHAGE) 1000 MG tablet Take 0.5 tablets (500 mg total) by mouth 2 (two) times daily. 05/01/19  Yes Florencia Reasons, MD  Multiple Vitamin (MULTIVITAMIN WITH MINERALS) TABS tablet Take 1 tablet by mouth daily. Over the counter  08/12/19  Yes Rai, Ripudeep K, MD  naproxen (NAPROSYN) 500 MG tablet Take 500 mg by mouth 2 (two) times daily as needed for moderate pain.   Yes [provider]  pantoprazole (PROTONIX) 40 MG tablet Take 1 tablet (40 mg total) by mouth 2 (two) times daily before a meal. 05/28/19  Yes Nandigam, Venia Minks, MD  QUEtiapine (SEROQUEL) 25 MG tablet Take 1 tablet (25 mg total) by mouth 2 (two) times daily. 08/11/19  Yes Rai, Ripudeep K, MD  senna-docusate (SENOKOT-S) 8.6-50 MG tablet Take 1 tablet by mouth at bedtime. 05/01/19  Yes Florencia Reasons, MD  sucralfate (CARAFATE) 1 g tablet Take 1 g by mouth 4 (four) times daily.  06/23/19  Yes [provider]  SYMBICORT 160-4.5 MCG/ACT inhaler Inhale 2 puffs into the lungs 2 (two) times daily. 07/20/19  Yes [provider]  vitamin B-12 1000 MCG tablet Take 1 tablet (1,000 mcg total) by mouth daily. 08/12/19  Yes Rai, Ripudeep  K, MD  Vitamin D, Ergocalciferol, (DRISDOL) 1.25 MG (50000 UT) CAPS capsule Take 50,000 Units by mouth every Friday.  03/10/19  Yes [provider]    Family History Family History  Problem Relation Age of Onset  . Heart disease Mother   . Cancer Neg Hx     Social History Social History   Tobacco Use  . Smoking status: Former Smoker    Packs/day: 1.50    Years: 60.00    Pack years: 90.00    Types: Cigarettes    Quit date: 09/29/2012    Years since quitting: 6.9  . Smokeless tobacco: Never Used  Substance Use Topics  . Alcohol use: No    Alcohol/week: 0.0 standard drinks  . Drug use: No     Allergies   Levaquin [levofloxacin in d5w] and Lisinopril   Review of Systems Review of Systems  Musculoskeletal:       L knee pain   All other systems reviewed and are negative.    Physical Exam Updated Vital Signs BP 135/66 (BP Location: Right Arm)   Pulse 83   Temp 98.2 F (36.8 C) (Oral)   Resp 17   Ht 5\' 4"  (1.626 m)   Wt 91 kg   SpO2 98%   BMI 34.44 kg/m   Physical Exam Vitals signs  and nursing note reviewed.  HENT:     Head: Normocephalic.     Nose: Nose normal.     Mouth/Throat:     Mouth: Mucous membranes are moist.  Eyes:     Pupils: Pupils are equal, round, and reactive to light.  Neck:     Musculoskeletal: Normal range of motion.  Cardiovascular:     Rate and Rhythm: Normal rate and regular rhythm.     Pulses: Normal pulses.  Pulmonary:     Effort: Pulmonary effort is normal.     Breath sounds: Normal breath sounds.  Abdominal:     General: Abdomen is flat.     Palpations: Abdomen is soft.  Musculoskeletal:     Comments: + L knee effusion. ACL and PCL intact.   Skin:    General: Skin is warm.     Capillary Refill: Capillary refill takes less than 2 seconds.  Neurological:     General: No focal deficit present.     Mental Status: He is alert.  Psychiatric:        Mood and Affect: Mood normal.      ED Treatments / Results  Labs (all labs ordered are listed, but only abnormal results are displayed) Labs Reviewed - No data to display  EKG None  Radiology Ct Knee Left Wo Contrast  Result Date: 08/25/2019 CLINICAL DATA:  Chronic knee swelling.  Fall this morning. EXAM: CT OF THE LEFT  KNEE WITHOUT CONTRAST TECHNIQUE: Multidetector CT imaging of the left knee was performed according to the standard protocol. Multiplanar CT image reconstructions were also generated. COMPARISON:  Left knee x-rays from same day. FINDINGS: Bones/Joint/Cartilage No acute fracture or dislocation. Moderate joint effusion. Moderate to severe medial compartment joint space narrowing. Mild lateral and patellofemoral compartment joint space narrowing. Tricompartmental marginal osteophytes. Subchondral sclerosis in the medial compartment. Mild genu valgus deformity. Ligaments Suboptimally assessed by CT. Muscles and Tendons Grossly intact. Soft tissues Vascular calcifications.  No soft tissue mass or fluid collection. IMPRESSION: 1.  No acute osseous abnormality. 2.  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 3. Moderate joint effusion. Electronically Signed   By: Gwyndolyn Saxon  Marzella Schlein M.D.   On: 08/25/2019 18:23   Dg Knee Complete 4 Views Left  Result Date: 08/25/2019 CLINICAL DATA:  Status post fall, left knee pain EXAM: LEFT KNEE - COMPLETE 4+ VIEW COMPARISON:  None. FINDINGS: No acute fracture or dislocation. Moderate-severe medial femorotibial compartment joint space narrowing. Mild lateral femorotibial compartment joint space narrowing. Mild patellofemoral compartment joint space narrowing. Tricompartmental marginal osteophytes. Large joint effusion. Peripheral vascular atherosclerotic disease. IMPRESSION: 1. No acute osseous injury of the left knee. 2. Tricompartmental osteoarthritis of the left knee most severe in the medial femorotibial compartment. Electronically Signed   By: Kathreen Devoid   On: 08/25/2019 14:25    Procedures Procedures (including critical care time)  Medications Ordered in ED Medications  HYDROcodone-acetaminophen (NORCO/VICODIN) 5-325 MG per tablet 1 tablet (1 tablet Oral Given 08/25/19 1635)     Initial Impression / Assessment and Plan / ED Course  I have reviewed the triage vital signs and the nursing notes.  Pertinent labs & imaging results that were available during my care of the patient were reviewed by me and considered in my medical decision making (see chart for details).        SHAUL TRAUTMAN is a 73 y.o. male here with L knee effusion after fall. Consider tibial plateau fracture vs arthritis. Patient has walker at home but no wheelchair. I talked to daughter. She states that he is not going to sit on a wheelchair. Will get xrays and if negative, may need CT. No signs of septic joint. I doubt DVT.   6:29 PM Xrays and CT showed no fracture, just joint effusion. I think effusion from contusion vs arthritis. I talked to daughter and she request knee sleeve for comfort and told me that he  wouldn't sit in the wheelchair and has walker already. Will dc home with tramadol for pain, refer to ortho.   Final Clinical Impressions(s) / ED Diagnoses   Final diagnoses:  None    ED Discharge Orders    None       Drenda Freeze, MD 08/25/19 [phone]

## 2019-08-25 NOTE — ED Notes (Signed)
Still awaiting patient's family to come pick up patient.

## 2019-08-25 NOTE — ED Notes (Signed)
Called ortho to bedside  

## 2019-08-25 NOTE — Discharge Instructions (Addendum)
Take tramadol as needed for pain   See orthopedic doctor for follow up   Use knee sleeve and your walker for comfort   Return to ER if you have worse knee pain and swelling, unable to walk

## 2019-08-25 NOTE — ED Notes (Signed)
Patient transported to CT 

## 2019-08-25 NOTE — ED Notes (Signed)
Patient's daughter reports to this RN that there is a ride coming to pick patient up

## 2019-08-26 ENCOUNTER — Ambulatory Visit: Payer: Medicare Other

## 2019-08-27 ENCOUNTER — Telehealth: Payer: Self-pay | Admitting: Radiation Oncology

## 2019-08-27 ENCOUNTER — Ambulatory Visit: Payer: Medicare Other

## 2019-08-27 NOTE — Telephone Encounter (Signed)
Noted patient is shy 1 radiation treatment to completion. Phoned John [name], daughter, to inquire. Willette Cluster reports she is aware her father has one more treatment left and that he hasn't shown for treatment in three days. She states, "he is fine I am just trying to get him motivated to go." Inquired if assistance was needed and she denied. Verbalized he is scheduled for 1315 on Monday. She states,"I will try to get him to go." Providers informed of this finding.

## 2019-08-30 ENCOUNTER — Ambulatory Visit: Payer: Medicare Other

## 2019-08-31 ENCOUNTER — Ambulatory Visit: Payer: Medicare Other

## 2019-09-01 ENCOUNTER — Encounter (HOSPITAL_COMMUNITY): Payer: Self-pay | Admitting: Emergency Medicine

## 2019-09-01 ENCOUNTER — Inpatient Hospital Stay (HOSPITAL_COMMUNITY)
Admission: EM | Admit: 2019-09-01 | Discharge: 2019-09-05 | DRG: 683 | Disposition: A | Discharge status: Home Health | Payer: Medicare Other | Attending: Internal Medicine | Admitting: Internal Medicine

## 2019-09-01 ENCOUNTER — Other Ambulatory Visit: Payer: Self-pay

## 2019-09-01 ENCOUNTER — Ambulatory Visit: Payer: Medicare Other

## 2019-09-01 DIAGNOSIS — I10 Essential (primary) hypertension: Secondary | ICD-10-CM | POA: Diagnosis present

## 2019-09-01 DIAGNOSIS — Z20828 Contact with and (suspected) exposure to other viral communicable diseases: Secondary | ICD-10-CM | POA: Diagnosis present

## 2019-09-01 DIAGNOSIS — J449 Chronic obstructive pulmonary disease, unspecified: Secondary | ICD-10-CM | POA: Diagnosis present

## 2019-09-01 DIAGNOSIS — Z8249 Family history of ischemic heart disease and other diseases of the circulatory system: Secondary | ICD-10-CM

## 2019-09-01 DIAGNOSIS — C3431 Malignant neoplasm of lower lobe, right bronchus or lung: Secondary | ICD-10-CM | POA: Diagnosis present

## 2019-09-01 DIAGNOSIS — R627 Adult failure to thrive: Secondary | ICD-10-CM

## 2019-09-01 DIAGNOSIS — Z6834 Body mass index (BMI) 34.0-34.9, adult: Secondary | ICD-10-CM

## 2019-09-01 DIAGNOSIS — E876 Hypokalemia: Secondary | ICD-10-CM | POA: Diagnosis not present

## 2019-09-01 DIAGNOSIS — I110 Hypertensive heart disease with heart failure: Secondary | ICD-10-CM | POA: Diagnosis present

## 2019-09-01 DIAGNOSIS — M109 Gout, unspecified: Secondary | ICD-10-CM | POA: Diagnosis present

## 2019-09-01 DIAGNOSIS — R110 Nausea: Secondary | ICD-10-CM

## 2019-09-01 DIAGNOSIS — Z7901 Long term (current) use of anticoagulants: Secondary | ICD-10-CM

## 2019-09-01 DIAGNOSIS — E785 Hyperlipidemia, unspecified: Secondary | ICD-10-CM | POA: Diagnosis present

## 2019-09-01 DIAGNOSIS — Z9981 Dependence on supplemental oxygen: Secondary | ICD-10-CM

## 2019-09-01 DIAGNOSIS — D509 Iron deficiency anemia, unspecified: Secondary | ICD-10-CM

## 2019-09-01 DIAGNOSIS — M25562 Pain in left knee: Secondary | ICD-10-CM

## 2019-09-01 DIAGNOSIS — N179 Acute kidney failure, unspecified: Principal | ICD-10-CM

## 2019-09-01 DIAGNOSIS — Z791 Long term (current) use of non-steroidal anti-inflammatories (NSAID): Secondary | ICD-10-CM

## 2019-09-01 DIAGNOSIS — Z87891 Personal history of nicotine dependence: Secondary | ICD-10-CM

## 2019-09-01 DIAGNOSIS — I2510 Atherosclerotic heart disease of native coronary artery without angina pectoris: Secondary | ICD-10-CM | POA: Diagnosis present

## 2019-09-01 DIAGNOSIS — E119 Type 2 diabetes mellitus without complications: Secondary | ICD-10-CM

## 2019-09-01 DIAGNOSIS — R9431 Abnormal electrocardiogram [ECG] [EKG]: Secondary | ICD-10-CM

## 2019-09-01 DIAGNOSIS — E860 Dehydration: Secondary | ICD-10-CM | POA: Diagnosis present

## 2019-09-01 DIAGNOSIS — Z79899 Other long term (current) drug therapy: Secondary | ICD-10-CM

## 2019-09-01 DIAGNOSIS — F0150 Vascular dementia without behavioral disturbance: Secondary | ICD-10-CM | POA: Diagnosis present

## 2019-09-01 DIAGNOSIS — Z923 Personal history of irradiation: Secondary | ICD-10-CM

## 2019-09-01 DIAGNOSIS — Z7984 Long term (current) use of oral hypoglycemic drugs: Secondary | ICD-10-CM

## 2019-09-01 DIAGNOSIS — Z79891 Long term (current) use of opiate analgesic: Secondary | ICD-10-CM

## 2019-09-01 DIAGNOSIS — I5032 Chronic diastolic (congestive) heart failure: Secondary | ICD-10-CM | POA: Diagnosis present

## 2019-09-01 DIAGNOSIS — I252 Old myocardial infarction: Secondary | ICD-10-CM

## 2019-09-01 DIAGNOSIS — Z7951 Long term (current) use of inhaled steroids: Secondary | ICD-10-CM

## 2019-09-01 DIAGNOSIS — I272 Pulmonary hypertension, unspecified: Secondary | ICD-10-CM | POA: Diagnosis present

## 2019-09-01 DIAGNOSIS — Z8711 Personal history of peptic ulcer disease: Secondary | ICD-10-CM

## 2019-09-01 LAB — CBC WITH DIFFERENTIAL/PLATELET
Abs Immature Granulocytes: 0.05 10*3/uL (ref 0.00–0.07)
Basophils Absolute: 0 10*3/uL (ref 0.0–0.1)
Basophils Relative: 0 %
Eosinophils Absolute: 0 10*3/uL (ref 0.0–0.5)
Eosinophils Relative: 0 %
HCT: 29.7 % — ABNORMAL LOW (ref 39.0–52.0)
Hemoglobin: 9.4 g/dL — ABNORMAL LOW (ref 13.0–17.0)
Immature Granulocytes: 1 %
Lymphocytes Relative: 6 %
Lymphs Abs: 0.6 10*3/uL — ABNORMAL LOW (ref 0.7–4.0)
MCH: 31 pg (ref 26.0–34.0)
MCHC: 31.6 g/dL (ref 30.0–36.0)
MCV: 98 fL (ref 80.0–100.0)
Monocytes Absolute: 1.1 10*3/uL — ABNORMAL HIGH (ref 0.1–1.0)
Monocytes Relative: 11 %
Neutro Abs: 8.5 10*3/uL — ABNORMAL HIGH (ref 1.7–7.7)
Neutrophils Relative %: 82 %
Platelets: 345 10*3/uL (ref 150–400)
RBC: 3.03 MIL/uL — ABNORMAL LOW (ref 4.22–5.81)
RDW: 15 % (ref 11.5–15.5)
WBC: 10.3 10*3/uL (ref 4.0–10.5)
nRBC: 0 % (ref 0.0–0.2)

## 2019-09-01 MED ORDER — HYDROCODONE-ACETAMINOPHEN 5-325 MG PO TABS
1.0000 | ORAL_TABLET | Freq: Once | ORAL | Status: AC
  Administered 2019-09-02: 1 via ORAL
  Filled 2019-09-01: qty 1

## 2019-09-01 NOTE — ED Triage Notes (Signed)
EMS reports from home, increasing bilateral pain and edema x 2 weeks. Daughter states lack of appetite.  BP 132/68 HR 104 RR 18 Sp02 97 @ 2ltrs nasal cannula 24/7 CBG 151 Temp 98.1

## 2019-09-01 NOTE — ED Provider Notes (Signed)
TIME SEEN: 11:21 PM  CHIEF COMPLAINT: Left knee pain  HPI: Patient is a 73 year old male with history of hypertension, diabetes, hyperlipidemia, CHF, pulmonary hypertension, COPD on chronic oxygen, dementia who presents to the emergency department with concerns for left knee pain.  Patient's daughter provides most of the history.  She states that at the end of August patient fell injuring his knee.  He was seen in the emergency department on 8/26 and had an x-ray that showed no acute abnormality but severe tricompartmental arthritis.  She states he was prescribed tramadol but this is not helping his pain.  They are having a hard time getting him out of bed.  She feels like his knee and foot on that side have been swollen.  Patient is unable to provide any history but he is able to tell me that he is not having any chest pain, shortness of breath, abdominal pain, vomiting or diarrhea.  Daughter denies any fever.  They have a telemetry visit appointment with her PCP in 2 days.  Daughter also reports recently he has not been eating and drinking well.  She states she has a hard time getting him to use his knee sleeve, take his medications, get him to eat and drink, wear his oxygen.  She has his primary care provider.   ROS: See HPI Constitutional: no fever  Eyes: no drainage  ENT: no runny nose   Cardiovascular:  no chest pain  Resp: no SOB  GI: no vomiting GU: no dysuria Integumentary: no rash  Allergy: no hives  Musculoskeletal: no leg swelling  Neurological: no slurred speech ROS otherwise negative  PAST MEDICAL HISTORY/PAST SURGICAL HISTORY:  Past Medical History:  Diagnosis Date  . B12 deficiency   . CHF (congestive heart failure) ([place])   . COPD (chronic obstructive pulmonary disease) ([place])   . Diabetes mellitus without complication ([place])   . Folate deficiency   . Gout   . Hyperlipemia   . Hypertension   . 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  MI (myocardial infarction) ([place])   . Pulmonary hypertension ([facility])     MEDICATIONS:  Prior to Admission medications   Medication Sig Start Date End Date Taking? Authorizing Provider  albuterol (PROAIR HFA) 108 (90 BASE) MCG/ACT inhaler INHALE 2 PUFFS BY MOUTH EVERY 4 HOURS AS NEEDED FOR WHEEZING Patient taking differently: Inhale 2 puffs into the lungs every 4 (four) hours as needed for wheezing.  03/13/15  Yes Tanda Rockers, MD  allopurinol (ZYLOPRIM) 300 MG tablet Take 300 mg by mouth daily. 04/07/19  Yes [provider]  amLODipine (NORVASC) 10 MG tablet Take 10 mg by mouth daily. 05/07/19  Yes [provider]  atorvastatin (LIPITOR) 20 MG tablet Take 1 tablet (20 mg total) by mouth daily. 09/26/16  Yes Tanda Rockers, MD  Cholecalciferol (VITAMIN D3) 50 MCG (2000 UT) capsule Take 2,000 Units by mouth daily.  05/07/19  Yes [provider]  ferrous sulfate 325 (65 FE) MG tablet Take 1 tablet (325 mg total) by mouth every Monday, Wednesday, and Friday. 08/11/19 03/08/20 Yes Rai, Ripudeep K, MD  folic acid (FOLVITE) 1 MG tablet Take 1 tablet (1 mg total) by mouth daily. 04/09/19 04/08/20 Yes Donne Hazel, MD  metFORMIN (GLUCOPHAGE) 1000 MG tablet Take 0.5 tablets (500 mg total) by mouth 2 (two) times daily. 05/01/19  Yes Florencia Reasons, MD  Multiple Vitamin (MULTIVITAMIN WITH MINERALS) TABS tablet Take 1  tablet by mouth daily. Over the counter 08/12/19  Yes Rai, Ripudeep K, MD  naproxen (NAPROSYN) 500 MG tablet Take 500 mg by mouth 2 (two) times daily as needed for moderate pain.   Yes [provider]  pantoprazole (PROTONIX) 40 MG tablet Take 1 tablet (40 mg total) by mouth 2 (two) times daily before a meal. 05/28/19  Yes Nandigam, Venia Minks, MD  QUEtiapine (SEROQUEL) 25 MG tablet Take 1 tablet (25 mg total) by mouth 2 (two) times daily. 08/11/19  Yes Rai, Ripudeep K, MD  senna-docusate (SENOKOT-S) 8.6-50 MG tablet Take 1 tablet by mouth at bedtime. 05/01/19  Yes Florencia Reasons, MD   sucralfate (CARAFATE) 1 g tablet Take 1 g by mouth 4 (four) times daily.  06/23/19  Yes [provider]  SYMBICORT 160-4.5 MCG/ACT inhaler Inhale 2 puffs into the lungs 2 (two) times daily. 07/20/19  Yes [provider]  traMADol (ULTRAM) 50 MG tablet Take 1 tablet (50 mg total) by mouth every 6 (six) hours as needed. Patient taking differently: Take 50 mg by mouth every 6 (six) hours as needed for moderate pain or severe pain.  08/25/19  Yes Drenda Freeze, MD  vitamin B-12 1000 MCG tablet Take 1 tablet (1,000 mcg total) by mouth daily. 08/12/19  Yes Rai, Ripudeep K, MD  Vitamin D, Ergocalciferol, (DRISDOL) 1.25 MG (50000 UT) CAPS capsule Take 50,000 Units by mouth every Friday.  03/10/19  Yes [provider]    ALLERGIES:  Allergies  Allergen Reactions  . Levaquin [Levofloxacin In D5w] Other (See Comments)    Unknown rxn  . Lisinopril Swelling    SOCIAL HISTORY:  Social History   Tobacco Use  . Smoking status: Former Smoker    Packs/day: 1.50    Years: 60.00    Pack years: 90.00    Types: Cigarettes    Quit date: 09/29/2012    Years since quitting: 6.9  . Smokeless tobacco: Never Used  Substance Use Topics  . Alcohol use: No    Alcohol/week: 0.0 standard drinks    FAMILY HISTORY: Family History  Problem Relation Age of Onset  . Heart disease Mother   . Cancer Neg Hx     EXAM: BP 140/76 (BP Location: Left Arm)   Pulse 76   Temp 98.7 F (37.1 C)   Resp 16   SpO2 98%  CONSTITUTIONAL: Alert and oriented to person only and does not respond appropriately to questions. Well-appearing; well-nourished HEAD: Normocephalic EYES: Conjunctivae clear, pupils appear equal, EOMI ENT: normal nose; moist mucous membranes NECK: Supple, no meningismus, no nuchal rigidity, no LAD  CARD: RRR; S1 and S2 appreciated; no murmurs, no clicks, no rubs, no gallops RESP: Normal chest excursion without splinting or tachypnea; breath sounds clear and equal  bilaterally; no wheezes, no rhonchi, no rales, no hypoxia or respiratory distress, speaking full sentences ABD/GI: Normal bowel sounds; non-distended; soft, non-tender, no rebound, no guarding, no peritoneal signs, no hepatosplenomegaly BACK:  The back appears normal and is non-tender to palpation, there is no CVA tenderness EXT: Patient is tender to palpation diffusely over the left knee with moderate joint effusion.  No redness or warmth.  He is able to move the knee but does have pain.  Unable to test for ligamentous laxity secondary to pain.  No swelling in the calf or foot.  He is a 2+ left DP pulse.  No calf tenderness.  Compartments soft.  No bony abnormality.  No signs of cellulitis or abscess. SKIN:  Normal color for age and race; warm; no rash NEURO: Moves all extremities equally PSYCH: The patient's mood and manner are appropriate. Grooming and personal hygiene are appropriate.  MEDICAL DECISION MAKING: Patient here with left knee pain.  Suspect that this is from his chronic arthritis.  No signs of gout, septic arthritis, DVT, arterial obstruction, compartment syndrome, cellulitis or abscess at this time.  No new injury.  I do not feel he needs repeat imaging and daughter is comfortable with this plan.  Will give Vicodin.  Daughter also concerned that he is not eating and drinking well.  Will obtain labs, urine to evaluate for any organic cause for this but likely secondary to patient being elderly and demented.  He does not appear dehydrated clinically on my examination.  ED PROGRESS: Labs show hypokalemia, hypomagnesemia and acute renal failure.  EKG shows slightly prolonged QT interval that is new compared to previous.  Will give IV and oral potassium, IV magnesium and IV fluids.  Will admit to medicine.  Daughter has been updated.  12:49 AM Discussed patient's case with hospitalist, Dr. Hal Hope.  I have recommended admission and patient (and family if present) agree with this plan.  Admitting physician will place admission orders.   I reviewed all nursing notes, vitals, pertinent previous records, EKGs, lab and urine results, imaging (as available).     EKG Interpretation  Date/Time:  Thursday September 02 2019 00:38:25 EDT Ventricular Rate:  93 PR Interval:    QRS Duration: 95 QT Interval:  396 QTC Calculation: 493 R Axis:   21 Text Interpretation:  Sinus rhythm Probable left atrial enlargement Borderline T abnormalities, anterior leads Borderline prolonged QT interval that is new Confirmed by Pryor Curia [phone]) on 09/02/2019 1:29:11 AM        CRITICAL CARE Performed by: Cyril Mourning Lundy Cozart   Total critical care time: 45 minutes  Critical care time was exclusive of separately billable procedures and treating other patients.  Critical care was necessary to treat or prevent imminent or life-threatening deterioration.  Critical care was time spent personally by me on the following activities: development of treatment plan with patient and/or surrogate as well as nursing, discussions with consultants, evaluation of patient's response to treatment, examination of patient, obtaining history from patient or surrogate, ordering and performing treatments and interventions, ordering and review of laboratory studies, ordering and review of radiographic studies, pulse oximetry and re-evaluation of patient's condition.    Adamary Savary, Delice Bison, DO 09/02/19 0129

## 2019-09-02 ENCOUNTER — Encounter (HOSPITAL_COMMUNITY): Payer: Self-pay | Admitting: Internal Medicine

## 2019-09-02 ENCOUNTER — Observation Stay (HOSPITAL_COMMUNITY): Payer: Medicare Other

## 2019-09-02 ENCOUNTER — Other Ambulatory Visit: Payer: Self-pay

## 2019-09-02 ENCOUNTER — Ambulatory Visit: Payer: Medicare Other

## 2019-09-02 DIAGNOSIS — E876 Hypokalemia: Secondary | ICD-10-CM

## 2019-09-02 DIAGNOSIS — R110 Nausea: Secondary | ICD-10-CM

## 2019-09-02 DIAGNOSIS — J449 Chronic obstructive pulmonary disease, unspecified: Secondary | ICD-10-CM

## 2019-09-02 DIAGNOSIS — N179 Acute kidney failure, unspecified: Secondary | ICD-10-CM | POA: Diagnosis not present

## 2019-09-02 DIAGNOSIS — M25562 Pain in left knee: Secondary | ICD-10-CM | POA: Diagnosis not present

## 2019-09-02 LAB — CBC
HCT: 27.2 % — ABNORMAL LOW (ref 39.0–52.0)
Hemoglobin: 8.4 g/dL — ABNORMAL LOW (ref 13.0–17.0)
MCH: 30.3 pg (ref 26.0–34.0)
MCHC: 30.9 g/dL (ref 30.0–36.0)
MCV: 98.2 fL (ref 80.0–100.0)
Platelets: 327 10*3/uL (ref 150–400)
RBC: 2.77 MIL/uL — ABNORMAL LOW (ref 4.22–5.81)
RDW: 14.9 % (ref 11.5–15.5)
WBC: 8.3 10*3/uL (ref 4.0–10.5)
nRBC: 0 % (ref 0.0–0.2)

## 2019-09-02 LAB — COMPREHENSIVE METABOLIC PANEL
ALT: 10 U/L (ref 0–44)
AST: 15 U/L (ref 15–41)
Albumin: 3.3 g/dL — ABNORMAL LOW (ref 3.5–5.0)
Alkaline Phosphatase: 61 U/L (ref 38–126)
Anion gap: 15 (ref 5–15)
BUN: 25 mg/dL — ABNORMAL HIGH (ref 8–23)
CO2: 27 mmol/L (ref 22–32)
Calcium: 9.8 mg/dL (ref 8.9–10.3)
Chloride: 100 mmol/L (ref 98–111)
Creatinine, Ser: 2.03 mg/dL — ABNORMAL HIGH (ref 0.61–1.24)
GFR calc Af Amer: 36 mL/min — ABNORMAL LOW (ref >60)
GFR calc non Af Amer: 31 mL/min — ABNORMAL LOW (ref >60)
Glucose, Bld: 117 mg/dL — ABNORMAL HIGH (ref 70–99)
Potassium: 2.7 mmol/L — CL (ref 3.5–5.1)
Sodium: 142 mmol/L (ref 135–145)
Total Bilirubin: 0.6 mg/dL (ref 0.3–1.2)
Total Protein: 8 g/dL (ref 6.5–8.1)

## 2019-09-02 LAB — BASIC METABOLIC PANEL
Anion gap: 11 (ref 5–15)
BUN: 25 mg/dL — ABNORMAL HIGH (ref 8–23)
CO2: 27 mmol/L (ref 22–32)
Calcium: 9 mg/dL (ref 8.9–10.3)
Chloride: 105 mmol/L (ref 98–111)
Creatinine, Ser: 1.77 mg/dL — ABNORMAL HIGH (ref 0.61–1.24)
GFR calc Af Amer: 43 mL/min — ABNORMAL LOW (ref >60)
GFR calc non Af Amer: 37 mL/min — ABNORMAL LOW (ref >60)
Glucose, Bld: 99 mg/dL (ref 70–99)
Potassium: 3.4 mmol/L — ABNORMAL LOW (ref 3.5–5.1)
Sodium: 143 mmol/L (ref 135–145)

## 2019-09-02 LAB — MAGNESIUM
Magnesium: 1.1 mg/dL — ABNORMAL LOW (ref 1.7–2.4)
Magnesium: 1.5 mg/dL — ABNORMAL LOW (ref 1.7–2.4)

## 2019-09-02 LAB — URINALYSIS, ROUTINE W REFLEX MICROSCOPIC
Bilirubin Urine: NEGATIVE
Glucose, UA: NEGATIVE mg/dL
Hgb urine dipstick: NEGATIVE
Ketones, ur: NEGATIVE mg/dL
Nitrite: NEGATIVE
Protein, ur: 30 mg/dL — AB
Specific Gravity, Urine: 1.02 (ref 1.005–1.030)
pH: 5 (ref 5.0–8.0)

## 2019-09-02 LAB — SARS CORONAVIRUS 2 (TAT 6-24 HRS): SARS Coronavirus 2: NEGATIVE

## 2019-09-02 LAB — VITAMIN B12: Vitamin B-12: 581 pg/mL (ref 180–914)

## 2019-09-02 LAB — FERRITIN: Ferritin: 142 ng/mL (ref 24–336)

## 2019-09-02 LAB — GLUCOSE, CAPILLARY
Glucose-Capillary: 93 mg/dL (ref 70–99)
Glucose-Capillary: 99 mg/dL (ref 70–99)

## 2019-09-02 LAB — FOLATE: Folate: 13.2 ng/mL (ref >5.9)

## 2019-09-02 LAB — RETICULOCYTES
Immature Retic Fract: 16.4 % — ABNORMAL HIGH (ref 2.3–15.9)
RBC.: 2.86 MIL/uL — ABNORMAL LOW (ref 4.22–5.81)
Retic Count, Absolute: 39.2 10*3/uL (ref 19.0–186.0)
Retic Ct Pct: 1.4 % (ref 0.4–3.1)

## 2019-09-02 LAB — IRON AND TIBC
Iron: 6 ug/dL — ABNORMAL LOW (ref 45–182)
Saturation Ratios: 3 % — ABNORMAL LOW (ref 17.9–39.5)
TIBC: 203 ug/dL — ABNORMAL LOW (ref 250–450)
UIBC: 197 ug/dL

## 2019-09-02 LAB — CBG MONITORING, ED
Glucose-Capillary: 78 mg/dL (ref 70–99)
Glucose-Capillary: 111 mg/dL — ABNORMAL HIGH (ref 70–99)

## 2019-09-02 LAB — BRAIN NATRIURETIC PEPTIDE: B Natriuretic Peptide: 42.3 pg/mL (ref 0.0–100.0)

## 2019-09-02 MED ORDER — MAGNESIUM SULFATE 2 GM/50ML IV SOLN
2.0000 g | Freq: Once | INTRAVENOUS | Status: AC
  Administered 2019-09-02: 2 g via INTRAVENOUS
  Filled 2019-09-02: qty 50

## 2019-09-02 MED ORDER — POTASSIUM CHLORIDE 10 MEQ/100ML IV SOLN
INTRAVENOUS | Status: AC
  Filled 2019-09-02: qty 100

## 2019-09-02 MED ORDER — ATORVASTATIN CALCIUM 20 MG PO TABS
20.0000 mg | ORAL_TABLET | Freq: Every day | ORAL | Status: DC
  Administered 2019-09-02 – 2019-09-04 (×3): 20 mg via ORAL
  Filled 2019-09-02 (×4): qty 1

## 2019-09-02 MED ORDER — POTASSIUM CHLORIDE CRYS ER 20 MEQ PO TBCR
40.0000 meq | EXTENDED_RELEASE_TABLET | Freq: Once | ORAL | Status: AC
  Administered 2019-09-02: 40 meq via ORAL
  Filled 2019-09-02: qty 2

## 2019-09-02 MED ORDER — MAGNESIUM SULFATE 4 GM/100ML IV SOLN
4.0000 g | Freq: Once | INTRAVENOUS | Status: AC
  Administered 2019-09-02: 10:00:00 4 g via INTRAVENOUS
  Filled 2019-09-02: qty 100

## 2019-09-02 MED ORDER — SODIUM CHLORIDE 0.9 % IV SOLN
INTRAVENOUS | Status: DC
  Administered 2019-09-02: 01:00:00 via INTRAVENOUS

## 2019-09-02 MED ORDER — LACTATED RINGERS IV SOLN
INTRAVENOUS | Status: AC
  Administered 2019-09-02 – 2019-09-03 (×5): via INTRAVENOUS

## 2019-09-02 MED ORDER — INSULIN ASPART 100 UNIT/ML ~~LOC~~ SOLN
0.0000 [IU] | Freq: Three times a day (TID) | SUBCUTANEOUS | Status: DC
  Filled 2019-09-02: qty 0.09

## 2019-09-02 MED ORDER — SENNOSIDES-DOCUSATE SODIUM 8.6-50 MG PO TABS
1.0000 | ORAL_TABLET | Freq: Every day | ORAL | Status: DC
  Administered 2019-09-02 – 2019-09-04 (×3): 1 via ORAL
  Filled 2019-09-02 (×3): qty 1

## 2019-09-02 MED ORDER — MOMETASONE FURO-FORMOTEROL FUM 200-5 MCG/ACT IN AERO
2.0000 | INHALATION_SPRAY | Freq: Two times a day (BID) | RESPIRATORY_TRACT | Status: DC
  Administered 2019-09-02 – 2019-09-05 (×6): 2 via RESPIRATORY_TRACT
  Filled 2019-09-02 (×2): qty 8.8

## 2019-09-02 MED ORDER — PANTOPRAZOLE SODIUM 40 MG PO TBEC
40.0000 mg | DELAYED_RELEASE_TABLET | Freq: Two times a day (BID) | ORAL | Status: DC
  Administered 2019-09-02 – 2019-09-05 (×7): 40 mg via ORAL
  Filled 2019-09-02 (×7): qty 1

## 2019-09-02 MED ORDER — ALBUTEROL SULFATE HFA 108 (90 BASE) MCG/ACT IN AERS: 2.0000 | INHALATION_SPRAY | RESPIRATORY_TRACT | Status: DC | PRN

## 2019-09-02 MED ORDER — HYDRALAZINE HCL 20 MG/ML IJ SOLN: 10.0000 mg | Freq: Four times a day (QID) | INTRAMUSCULAR | Status: DC | PRN

## 2019-09-02 MED ORDER — LACTATED RINGERS IV SOLN
INTRAVENOUS | Status: DC
  Administered 2019-09-02: 07:00:00 via INTRAVENOUS

## 2019-09-02 MED ORDER — AMLODIPINE BESYLATE 10 MG PO TABS
10.0000 mg | ORAL_TABLET | Freq: Every day | ORAL | Status: DC
  Administered 2019-09-02 – 2019-09-05 (×4): 10 mg via ORAL
  Filled 2019-09-02: qty 1
  Filled 2019-09-02: qty 2
  Filled 2019-09-02 (×2): qty 1

## 2019-09-02 MED ORDER — VITAMIN B-12 1000 MCG PO TABS
1000.0000 ug | ORAL_TABLET | Freq: Every day | ORAL | Status: DC
  Administered 2019-09-02 – 2019-09-05 (×4): 1000 ug via ORAL
  Filled 2019-09-02 (×4): qty 1

## 2019-09-02 MED ORDER — QUETIAPINE FUMARATE 25 MG PO TABS
25.0000 mg | ORAL_TABLET | Freq: Two times a day (BID) | ORAL | Status: DC
  Administered 2019-09-02 – 2019-09-05 (×7): 25 mg via ORAL
  Filled 2019-09-02 (×7): qty 1

## 2019-09-02 MED ORDER — ACETAMINOPHEN 325 MG PO TABS
650.0000 mg | ORAL_TABLET | Freq: Four times a day (QID) | ORAL | Status: DC | PRN
  Administered 2019-09-05: 650 mg via ORAL
  Filled 2019-09-02: qty 2

## 2019-09-02 MED ORDER — POTASSIUM CHLORIDE 10 MEQ/100ML IV SOLN
10.0000 meq | INTRAVENOUS | Status: AC
  Administered 2019-09-02 (×3): 10 meq via INTRAVENOUS
  Filled 2019-09-02 (×2): qty 100

## 2019-09-02 MED ORDER — FERROUS SULFATE 325 (65 FE) MG PO TABS
325.0000 mg | ORAL_TABLET | ORAL | Status: DC
  Administered 2019-09-03: 325 mg via ORAL
  Filled 2019-09-02: qty 1

## 2019-09-02 MED ORDER — ENOXAPARIN SODIUM 40 MG/0.4ML ~~LOC~~ SOLN
40.0000 mg | Freq: Every day | SUBCUTANEOUS | Status: DC
  Administered 2019-09-02 – 2019-09-03 (×2): 40 mg via SUBCUTANEOUS
  Filled 2019-09-02 (×3): qty 0.4

## 2019-09-02 MED ORDER — SUCRALFATE 1 G PO TABS
1.0000 g | ORAL_TABLET | Freq: Four times a day (QID) | ORAL | Status: DC
  Administered 2019-09-02 – 2019-09-05 (×14): 1 g via ORAL
  Filled 2019-09-02 (×14): qty 1

## 2019-09-02 MED ORDER — FOLIC ACID 1 MG PO TABS
1.0000 mg | ORAL_TABLET | Freq: Every day | ORAL | Status: DC
  Administered 2019-09-02 – 2019-09-05 (×4): 1 mg via ORAL
  Filled 2019-09-02 (×4): qty 1

## 2019-09-02 MED ORDER — ACETAMINOPHEN 650 MG RE SUPP: 650.0000 mg | Freq: Four times a day (QID) | RECTAL | Status: DC | PRN

## 2019-09-02 MED ORDER — TRAMADOL HCL 50 MG PO TABS
50.0000 mg | ORAL_TABLET | Freq: Four times a day (QID) | ORAL | Status: DC | PRN
  Administered 2019-09-02 – 2019-09-05 (×2): 50 mg via ORAL
  Filled 2019-09-02 (×2): qty 1

## 2019-09-02 MED ORDER — SODIUM CHLORIDE 0.9 % IV BOLUS (SEPSIS)
1000.0000 mL | Freq: Once | INTRAVENOUS | Status: AC
  Administered 2019-09-02: 01:00:00 1000 mL via INTRAVENOUS

## 2019-09-02 MED ORDER — ALLOPURINOL 300 MG PO TABS
300.0000 mg | ORAL_TABLET | Freq: Every day | ORAL | Status: DC
  Administered 2019-09-02 – 2019-09-05 (×4): 300 mg via ORAL
  Filled 2019-09-02 (×4): qty 1

## 2019-09-02 MED ORDER — POTASSIUM CHLORIDE 20 MEQ PO PACK
40.0000 meq | PACK | Freq: Once | ORAL | Status: AC
  Administered 2019-09-02: 40 meq via ORAL
  Filled 2019-09-02: qty 2

## 2019-09-02 NOTE — Progress Notes (Signed)
I have seen and assessed patient and agree with Dr. Moise Boring assessment and plan.  Patient is 73 year old gentleman history of recurrent non-small cell lung cancer who received chemotherapy first week of August 7322, diastolic CHF, COPD, diabetes recently admitted and discharged 3 weeks prior to admission for encephalopathy and acute renal failure noted to be orthostatic presenting back to the ED with poor oral intake x1 week, some epigastric discomfort and nausea and vomiting.  Work-up in the ED showed patient to be in acute renal failure, clinically dehydrated, with electrolyte abnormalities.  Patient noted to be anemic.  Will check an anemia panel.  Replace electrolytes.  Continue IV fluids.  Supportive care.  Oncology informed of patient's admission.  No charge.

## 2019-09-02 NOTE — ED Notes (Signed)
Pt back from radiology 

## 2019-09-02 NOTE — ED Notes (Signed)
Date and time results received: 09/02/19 12:16 AM (use smartphrase ".now" to insert current time)  Test: Potassium Critical Value: 2.7  Name of Provider Notified: Ward, MD  Orders Received? Or Actions Taken?: Orders Received - See Orders for details

## 2019-09-02 NOTE — Progress Notes (Signed)
RN called patient's daughter to update at 12.

## 2019-09-02 NOTE — Progress Notes (Signed)
Patient was transferred from EB at 1510. Alert and oriented x 4. Pain complained in both leg. No skin issue. Vital signs was taken. Room is set up. Call light was within patient's reach.

## 2019-09-02 NOTE — Progress Notes (Signed)
BP= 150/72. Patient has history of HTN, medication was given to patient as MD ordered. Will recheck BP later.

## 2019-09-02 NOTE — ED Notes (Signed)
Pt to restroom using stedy

## 2019-09-02 NOTE — ED Notes (Signed)
ED TO INPATIENT HANDOFF REPORT  ED Nurse Name and Phone #: [phone]  S Name/Age/Gender John [name] 73 y.o. male Room/Bed: WA17/WA17  Code Status   Code Status: Full Code  Home/SNF/Other Home Patient oriented to: self Is this baseline? Yes   Triage Complete: Triage complete  Chief Complaint bi lat leg pain, edema  Triage Note EMS reports from home, increasing bilateral pain and edema x 2 weeks. Daughter states lack of appetite.  BP 132/68 HR 104 RR 18 Sp02 97 @ 2ltrs nasal cannula 24/7 CBG 151 Temp 98.1   Allergies Allergies  Allergen Reactions  . Levaquin [Levofloxacin In D5w] Other (See Comments)    Unknown rxn  . Lisinopril Swelling    Level of Care/Admitting Diagnosis ED Disposition    ED Disposition Condition Comment   Admit  Hospital Area: [facility] [[id number]]  Level of Care: Telemetry [5]  Admit to tele based on following criteria: Monitor QTC interval  Covid Evaluation: Asymptomatic Screening Protocol (No Symptoms)  Diagnosis: ARF (acute renal failure) ([place]) [767341]  Admitting Physician: Rise Patience [phone]  Attending Physician: Rise Patience [email or web address]  PT Class (Do Not Modify): Observation [104]  PT Acc Code (Do Not Modify): Observation [10022]       B Medical/Surgery History Past Medical History:  Diagnosis Date  . B12 deficiency   . CHF (congestive heart failure) ([place])   . COPD (chronic obstructive pulmonary disease) ([place])   . Diabetes mellitus without complication ([place])   . Folate deficiency   . Gout   . Hyperlipemia   . Hypertension   . 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 MI (myocardial infarction) ([name])   . Pulmonary hypertension ([place])    Past Surgical History:  Procedure Laterality Date  . APPENDECTOMY    . BIOPSY  04/21/2019   Procedure: BIOPSY;  Surgeon: Gatha Mayer, MD;  Location: [facility];  Service: Endoscopy;;  . ESOPHAGOGASTRODUODENOSCOPY  (EGD) WITH PROPOFOL N/A 04/21/2019   Procedure: ESOPHAGOGASTRODUODENOSCOPY (EGD) WITH PROPOFOL;  Surgeon: Gatha Mayer, MD;  Location: [facility];  Service: Endoscopy;  Laterality: N/A;  . ESOPHAGOGASTRODUODENOSCOPY (EGD) WITH PROPOFOL N/A 04/29/2019   Procedure: ESOPHAGOGASTRODUODENOSCOPY (EGD) WITH PROPOFOL;  Surgeon: Mauri Pole, MD;  Location: [facility];  Service: Endoscopy;  Laterality: N/A;  . HEMOSTASIS CLIP PLACEMENT  04/29/2019   Procedure: HEMOSTASIS CLIP PLACEMENT;  Surgeon: Mauri Pole, MD;  Location: [facility];  Service: Endoscopy;;  Clip placed as marker not for bleeding control  . IR ANGIOGRAM SELECTIVE EACH ADDITIONAL VESSEL  04/29/2019  . IR ANGIOGRAM SELECTIVE EACH ADDITIONAL VESSEL  04/29/2019  . IR ANGIOGRAM SELECTIVE EACH ADDITIONAL VESSEL  04/29/2019  . IR ANGIOGRAM VISCERAL SELECTIVE  04/29/2019  . IR EMBO ART  VEN HEMORR LYMPH EXTRAV  INC GUIDE ROADMAPPING  04/29/2019  . IR US GUIDE VASC ACCESS RIGHT  04/29/2019  . LUNG BIOPSY       A IV Location/Drains/Wounds Patient Lines/Drains/Airways Status   Active Line/Drains/Airways    Name:   Placement date:   Placement time:   Site:   Days:   Peripheral IV 09/01/19 Left Antecubital   09/01/19    2248    Antecubital   1   Wound / Incision (Open or Dehisced) 07/30/19 Puncture Flank Right;Upper   07/30/19    1145    Flank   34          Intake/Output Last 24 hours  Intake/Output Summary (Last 24 hours) at 09/02/2019 1459 Last data filed at 09/02/2019 1236 Gross per 24 hour  Intake 438.33 ml  Output -  Net 438.33 ml    Labs/Imaging Results for orders placed or performed during the hospital encounter of 09/01/19 (from the past 48 hour(s))  CBC with Differential     Status: Abnormal   Collection Time: 09/01/19 10:50 PM  Result Value Ref Range   WBC 10.3 4.0 - 10.5 K/uL   RBC 3.03 (L) 4.22 - 5.81 MIL/uL   Hemoglobin 9.4 (L) 13.0 - 17.0 g/dL   HCT 29.7 (L) 39.0 - 52.0 %   MCV 98.0 80.0 - 100.0 fL    MCH 31.0 26.0 - 34.0 pg   MCHC 31.6 30.0 - 36.0 g/dL   RDW 15.0 11.5 - 15.5 %   Platelets 345 150 - 400 K/uL   nRBC 0.0 0.0 - 0.2 %   Neutrophils Relative % 82 %   Neutro Abs 8.5 (H) 1.7 - 7.7 K/uL   Lymphocytes Relative 6 %   Lymphs Abs 0.6 (L) 0.7 - 4.0 K/uL   Monocytes Relative 11 %   Monocytes Absolute 1.1 (H) 0.1 - 1.0 K/uL   Eosinophils Relative 0 %   Eosinophils Absolute 0.0 0.0 - 0.5 K/uL   Basophils Relative 0 %   Basophils Absolute 0.0 0.0 - 0.1 K/uL   Immature Granulocytes 1 %   Abs Immature Granulocytes 0.05 0.00 - 0.07 K/uL    Comment: Performed at [facility], [place] [address], [place], [place] [address]  Comprehensive metabolic panel     Status: Abnormal   Collection Time: 09/01/19 10:50 PM  Result Value Ref Range   Sodium 142 135 - 145 mmol/L   Potassium 2.7 (LL) 3.5 - 5.1 mmol/L    Comment: CRITICAL RESULT CALLED TO, READ BACK BY AND VERIFIED WITH: S,DOSTER AT 0005 ON 09/02/19 BY A,MOHAMED    Chloride 100 98 - 111 mmol/L   CO2 27 22 - 32 mmol/L   Glucose, Bld 117 (H) 70 - 99 mg/dL   BUN 25 (H) 8 - 23 mg/dL   Creatinine, Ser 2.03 (H) 0.61 - 1.24 mg/dL   Calcium 9.8 8.9 - 10.3 mg/dL   Total Protein 8.0 6.5 - 8.1 g/dL   Albumin 3.3 (L) 3.5 - 5.0 g/dL   AST 15 15 - 41 U/L   ALT 10 0 - 44 U/L   Alkaline Phosphatase 61 38 - 126 U/L   Total Bilirubin 0.6 0.3 - 1.2 mg/dL   GFR calc non Af Amer 31 (L) >60 mL/min   GFR calc Af Amer 36 (L) >60 mL/min   Anion gap 15 5 - 15    Comment: Performed at [facility], [place] [address] [place], [place] [address]  Brain natriuretic peptide     Status: None   Collection Time: 09/01/19 10:50 PM  Result Value Ref Range   B Natriuretic Peptide 42.3 0.0 - 100.0 pg/mL    Comment: Performed at [facility], [place] [address], [place], [place] [address]  Magnesium     Status: Abnormal   Collection Time: 09/01/19 10:50 PM  Result Value Ref Range   Magnesium 1.1 (L) 1.7 - 2.4 mg/dL     Comment: Performed at [facility], [place] [address] [place], [place] [address]  Urinalysis, Routine w reflex microscopic     Status: Abnormal   Collection Time: 09/01/19 11:27 PM  Result Value Ref Range  Color, Urine AMBER (A) YELLOW    Comment: BIOCHEMICALS MAY BE AFFECTED BY COLOR   APPearance HAZY (A) CLEAR   Specific Gravity, Urine 1.020 1.005 - 1.030   pH 5.0 5.0 - 8.0   Glucose, UA NEGATIVE NEGATIVE mg/dL   Hgb urine dipstick NEGATIVE NEGATIVE   Bilirubin Urine NEGATIVE NEGATIVE   Ketones, ur NEGATIVE NEGATIVE mg/dL   Protein, ur 30 (A) NEGATIVE mg/dL   Nitrite NEGATIVE NEGATIVE   Leukocytes,Ua TRACE (A) NEGATIVE   RBC / HPF 0-5 0 - 5 RBC/hpf   WBC, UA 6-10 0 - 5 WBC/hpf   Bacteria, UA FEW (A) NONE SEEN   Squamous Epithelial / LPF 0-5 0 - 5   Mucus PRESENT    Hyaline Casts, UA PRESENT    Granular Casts, UA PRESENT     Comment: Performed at [facility], [place] [address], [place], [place] [address]  SARS CORONAVIRUS 2 (TAT 6-24 HRS) Nasopharyngeal Nasopharyngeal Swab     Status: None   Collection Time: 09/02/19 12:41 AM   Specimen: Nasopharyngeal Swab  Result Value Ref Range   SARS Coronavirus 2 NEGATIVE NEGATIVE    Comment: (NOTE) SARS-CoV-2 target nucleic acids are NOT DETECTED. The SARS-CoV-2 RNA is generally detectable in upper and lower respiratory specimens during the acute phase of infection. Negative results do not preclude SARS-CoV-2 infection, do not rule out co-infections with other pathogens, and should not be used as the sole basis for treatment or other patient management decisions. Negative results must be combined with clinical observations, patient history, and epidemiological information. The expected result is Negative. Fact Sheet for Patients: SugarRoll.be Fact Sheet for Healthcare Providers: https://www.woods-mathews.com/ This test is not yet approved or cleared by the  [place] FDA and  has been authorized for detection and/or diagnosis of SARS-CoV-2 by FDA under an Emergency Use Authorization (EUA). This EUA will remain  in effect (meaning this test can be used) for the duration of the COVID-19 declaration under Section 56 4(b)(1) of the Act, 21 U.S.C. section 360bbb-3(b)(1), unless the authorization is terminated or revoked sooner. Performed at [facility], [place] [address] [place], [place] [address]   Basic metabolic panel     Status: Abnormal   Collection Time: 09/02/19  5:15 AM  Result Value Ref Range   Sodium 143 135 - 145 mmol/L   Potassium 3.4 (L) 3.5 - 5.1 mmol/L    Comment: DELTA CHECK NOTED REPEATED TO VERIFY NO VISIBLE HEMOLYSIS    Chloride 105 98 - 111 mmol/L   CO2 27 22 - 32 mmol/L   Glucose, Bld 99 70 - 99 mg/dL   BUN 25 (H) 8 - 23 mg/dL   Creatinine, Ser 1.77 (H) 0.61 - 1.24 mg/dL   Calcium 9.0 8.9 - 10.3 mg/dL   GFR calc non Af Amer 37 (L) >60 mL/min   GFR calc Af Amer 43 (L) >60 mL/min   Anion gap 11 5 - 15    Comment: Performed at [facility], [place] [address] [place], [place] [address]  CBC     Status: Abnormal   Collection Time: 09/02/19  5:15 AM  Result Value Ref Range   WBC 8.3 4.0 - 10.5 K/uL   RBC 2.77 (L) 4.22 - 5.81 MIL/uL   Hemoglobin 8.4 (L) 13.0 - 17.0 g/dL   HCT 27.2 (L) 39.0 - 52.0 %   MCV 98.2 80.0 - 100.0 fL   MCH 30.3 26.0 - 34.0 pg   MCHC 30.9  30.0 - 36.0 g/dL   RDW 14.9 11.5 - 15.5 %   Platelets 327 150 - 400 K/uL   nRBC 0.0 0.0 - 0.2 %    Comment: Performed at [facility], [place] [address], [place], [place] [address]  Magnesium     Status: Abnormal   Collection Time: 09/02/19  5:15 AM  Result Value Ref Range   Magnesium 1.5 (L) 1.7 - 2.4 mg/dL    Comment: Performed at [facility], [place] [address], [place], [place] [address]  CBG monitoring, ED     Status: None   Collection Time: 09/02/19  7:52 AM  Result Value Ref Range    Glucose-Capillary 78 70 - 99 mg/dL  Vitamin B12     Status: None   Collection Time: 09/02/19 10:20 AM  Result Value Ref Range   Vitamin B-12 581 180 - 914 pg/mL    Comment: (NOTE) This assay is not validated for testing neonatal or myeloproliferative syndrome specimens for Vitamin B12 levels. Performed at [facility], [place] [address], [place], [place] [address]   Folate     Status: None   Collection Time: 09/02/19 10:20 AM  Result Value Ref Range   Folate 13.2 >5.9 ng/mL    Comment: Performed at [facility], [place] [address], [place], [place] [address]  Iron and TIBC     Status: Abnormal   Collection Time: 09/02/19 10:20 AM  Result Value Ref Range   Iron 6 (L) 45 - 182 ug/dL   TIBC 203 (L) 250 - 450 ug/dL   Saturation Ratios 3 (L) 17.9 - 39.5 %   UIBC 197 ug/dL    Comment: Performed at [facility], [place] [address] [place], [place] [address]  Ferritin     Status: None   Collection Time: 09/02/19 10:20 AM  Result Value Ref Range   Ferritin 142 24 - 336 ng/mL    Comment: Performed at [facility], [place] [address], [place],  [address]  Reticulocytes     Status: Abnormal   Collection Time: 09/02/19 10:20 AM  Result Value Ref Range   Retic Ct Pct 1.4 0.4 - 3.1 %   RBC. 2.86 (L) 4.22 - 5.81 MIL/uL   Retic Count, Absolute 39.2 19.0 - 186.0 K/uL   Immature Retic Fract 16.4 (H) 2.3 - 15.9 %    Comment: Performed at [facility], [place] [address] [place],  [address]  CBG monitoring, ED     Status: Abnormal   Collection Time: 09/02/19 11:34 AM  Result Value Ref Range   Glucose-Capillary 111 (H) 70 - 99 mg/dL   Ct Abdomen Pelvis Wo Contrast  Result Date: 09/02/2019 CLINICAL DATA:  Abd pain, gastroenteritis or colitis suspected. Right lower lobe lung cancer. EXAM: CT ABDOMEN AND PELVIS WITHOUT CONTRAST TECHNIQUE: Multidetector CT imaging of the abdomen and pelvis was performed  following the standard protocol without IV contrast. COMPARISON:  PET scan 05/26/2019 FINDINGS: Lower chest: Right lower lobe lung mass has slightly decreased in size, now measuring 4.6 x 3.7 cm. No new lesions are present. There is mild dependent atelectasis. Heart size is normal. Coronary artery calcifications are present. Hepatobiliary: The liver is unremarkable. Prominent gallstones are present in the neck of the gallbladder. No inflammatory changes are present. The common bile duct is within normal limits. Pancreas: Vascular coils are present in the pancreatic head, unchanged. Pancreas is otherwise unremarkable. Spleen: Normal in size without focal abnormality.  Adrenals/Urinary Tract: Two upper pole right kidney stones are again seen, measuring up to 7 mm. These are both nonobstructing. A punctate nonobstructing stone is present at the lower pole of the right kidney. Exophytic cyst arising from the right kidney measures 4.5 cm, stable. Left kidney is within normal limits. Ureters are unremarkable. The urinary bladder is within normal limits. Stomach/Bowel: Minimal contrast is present in the stomach. The stomach and duodenum are otherwise unremarkable. Small bowel is within normal limits. Terminal ileum is normal. The ascending and transverse colon are within normal limits. Descending and sigmoid colon are normal. Vascular/Lymphatic: Atherosclerotic calcifications are present in the aorta and branch vessels. There is no aneurysm. Reproductive: Calcifications are present within the prostate gland. Gland is normal in size. Other: No abdominal wall hernia or abnormality. No abdominopelvic ascites. Musculoskeletal: Multilevel degenerative changes are again noted in the lumbar spine. No new focal lytic or blastic lesions are present. Bony pelvis is within normal limits. Degenerative changes are noted again at the ischial tuberosities and both hips. IMPRESSION: 1. No acute or focal abnormality abdomen to explain the  patient's abdominal pain. 2. Nonobstructing right-sided nephrolithiasis. 3.  Aortic Atherosclerosis (ICD10-I70.0). 4. Multilevel degenerative changes of the lumbar spine. 5. Coronary artery disease. 6. Slight decrease in size of known right lower lobe lung mass. Electronically Signed   By: San Morelle M.D.   On: 09/02/2019 05:04   Dg Abd Acute 2+v W 1v Chest  Result Date: 09/02/2019 CLINICAL DATA:  Nausea. EXAM: DG ABDOMEN ACUTE W/ 1V CHEST COMPARISON:  CT chest abdomen pelvis 04/21/2019 FINDINGS: Right lung base opacity corresponds to right lower lobe prior CT. Upper normal heart size. No evidence of acute airspace disease. No pleural fluid. No bowel dilatation to suggest obstruction. Embolization coils in the right upper quadrant. No free air. Right upper quadrant calcifications represent gallstones as well as right renal stones. Multilevel degenerative change in the spine. IMPRESSION: 1. Nonobstructive bowel gas pattern.  No free air. 2. Embolization coils in the right upper quadrant. 3. Right renal stones and gallstones. 4. Right lung base opacities correspond to right lung mass on prior CT. Electronically Signed   By: Keith Rake M.D.   On: 09/02/2019 03:02    Pending Labs Unresulted Labs (From admission, onward)    Start     Ordered   09/09/19 0500  Creatinine, serum  (enoxaparin (LOVENOX)    CrCl >/= 30 ml/min)  Weekly,   R    Comments: while on enoxaparin therapy    09/02/19 0220   09/03/19 0500  Magnesium  Tomorrow morning,   R     09/02/19 0855   09/02/19 0551  Culture, Urine  Once,   STAT     09/02/19 0550          Vitals/Pain Today's Vitals   09/02/19 1230 09/02/19 1300 09/02/19 1330 09/02/19 1430  BP: 130/74 (!) 147/68 137/64 125/64  Pulse: 77 91 85 82  Resp: 15 (!) 23 17 17   Temp:      SpO2: 95% 94% 97% 98%  PainSc:        Isolation Precautions No active isolations  Medications Medications  allopurinol (ZYLOPRIM) tablet 300 mg (300 mg Oral Given  09/02/19 1019)  traMADol (ULTRAM) tablet 50 mg (has no administration in time range)  amLODipine (NORVASC) tablet 10 mg (10 mg Oral Given 09/02/19 1017)  atorvastatin (LIPITOR) tablet 20 mg (has no administration in time range)  QUEtiapine (SEROQUEL) tablet 25 mg (25 mg Oral Given  09/02/19 1017)  pantoprazole (PROTONIX) EC tablet 40 mg (40 mg Oral Given 09/02/19 1018)  sucralfate (CARAFATE) tablet 1 g (1 g Oral Given 09/02/19 1342)  senna-docusate (Senokot-S) tablet 1 tablet (has no administration in time range)  ferrous sulfate tablet 325 mg (has no administration in time range)  folic acid (FOLVITE) tablet 1 mg (1 mg Oral Given 09/02/19 1018)  vitamin B-12 (CYANOCOBALAMIN) tablet 1,000 mcg (1,000 mcg Oral Given 09/02/19 1019)  albuterol (VENTOLIN HFA) 108 (90 Base) MCG/ACT inhaler 2 puff (has no administration in time range)  mometasone-formoterol (DULERA) 200-5 MCG/ACT inhaler 2 puff (2 puffs Inhalation Given 09/02/19 1020)  acetaminophen (TYLENOL) tablet 650 mg (has no administration in time range)    Or  acetaminophen (TYLENOL) suppository 650 mg (has no administration in time range)  insulin aspart (novoLOG) injection 0-9 Units (0 Units Subcutaneous Not Given 09/02/19 1135)  enoxaparin (LOVENOX) injection 40 mg (40 mg Subcutaneous Given 09/02/19 1054)  lactated ringers infusion ( Intravenous New Bag/Given 09/02/19 1026)  HYDROcodone-acetaminophen (NORCO/VICODIN) 5-325 MG per tablet 1 tablet (1 tablet Oral Given 09/02/19 0011)  potassium chloride 10 mEq in 100 mL IVPB (0 mEq Intravenous Stopped 09/02/19 0736)  potassium chloride SA (K-DUR) CR tablet 40 mEq (40 mEq Oral Given 09/02/19 0050)  sodium chloride 0.9 % bolus 1,000 mL (0 mLs Intravenous Stopped 09/02/19 0316)  magnesium sulfate IVPB 2 g 50 mL (0 g Intravenous Stopped 09/02/19 0208)  magnesium sulfate IVPB 4 g 100 mL (0 g Intravenous Stopped 09/02/19 1236)  potassium chloride (KLOR-CON) packet 40 mEq (40 mEq Oral Given 09/02/19 1017)    Mobility walks with  person assist High fall risk   Focused Assessments Cardiac Assessment Handoff:    Lab Results  Component Value Date   CKTOTAL 69 02/23/2012   CKMB 2.9 02/23/2012   TROPONINI <0.03 04/28/2019   Lab Results  Component Value Date   DDIMER 0.44 11/28/2012   Does the Patient currently have chest pain? No     R Recommendations: See Admitting Provider Note  Report given to:   Additional Notes: Patient is not steady on his feet.

## 2019-09-02 NOTE — H&P (Addendum)
History and Physical    John [name] ZWC:[id number] DOB: 1946/11/03 DOA: 09/01/2019  PCP: [name], NP  Patient coming from: Home.  Chief Complaint: Poor appetite.  History obtained from patient and patient's daughter.  HPI: John [name] is a 73 y.o. male with history of recurrent non-small cell lung cancer received chemotherapy in the first week of August last month, diastolic CHF COPD diabetes mellitus, gout who was recently admitted and discharged 3 weeks ago for encephalopathy and renal failure at that time patient also was found to be orthostatic hypotensive many of patient's antihypertensives were discontinued at that time was receiving IV fluids and discharged home.  Per patient's daughter patient has benign exam poor appetite over the last 1 week and has not been eating well on questioning patient states he has been having epigastric discomfort with nausea.  Denies any diarrhea chest pain shortness of breath.  ED Course: In the ER acute abdominal series was unremarkable.  Labs revealed acute renal failure with creatinine worsening from 1 and presently is 2.  Albumin 3.3 LFTs were largely unremarkable.  Potassium and magnesium was low.  On exam patient has mild epigastric tenderness for which I have ordered CT abdomen.  Patient is started on IV fluids for acute renal failure and admitted for further management.  Review of Systems: As per HPI, rest all negative.   Past Medical History:  Diagnosis Date  . B12 deficiency   . CHF (congestive heart failure) ([place])   . COPD (chronic obstructive pulmonary disease) ([place])   . Diabetes mellitus without complication ([place])   . Folate deficiency   . Gout   . Hyperlipemia   . Hypertension   . 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 MI (myocardial infarction) ([place])   . Pulmonary hypertension ([place])     Past Surgical History:  Procedure Laterality Date  . APPENDECTOMY    . BIOPSY   04/21/2019   Procedure: BIOPSY;  Surgeon: Gatha Mayer, MD;  Location: [facility];  Service: Endoscopy;;  . ESOPHAGOGASTRODUODENOSCOPY (EGD) WITH PROPOFOL N/A 04/21/2019   Procedure: ESOPHAGOGASTRODUODENOSCOPY (EGD) WITH PROPOFOL;  Surgeon: Gatha Mayer, MD;  Location: [facility];  Service: Endoscopy;  Laterality: N/A;  . ESOPHAGOGASTRODUODENOSCOPY (EGD) WITH PROPOFOL N/A 04/29/2019   Procedure: ESOPHAGOGASTRODUODENOSCOPY (EGD) WITH PROPOFOL;  Surgeon: Mauri Pole, MD;  Location: [facility];  Service: Endoscopy;  Laterality: N/A;  . HEMOSTASIS CLIP PLACEMENT  04/29/2019   Procedure: HEMOSTASIS CLIP PLACEMENT;  Surgeon: Mauri Pole, MD;  Location: [facility];  Service: Endoscopy;;  Clip placed as marker not for bleeding control  . IR ANGIOGRAM SELECTIVE EACH ADDITIONAL VESSEL  04/29/2019  . IR ANGIOGRAM SELECTIVE EACH ADDITIONAL VESSEL  04/29/2019  . IR ANGIOGRAM SELECTIVE EACH ADDITIONAL VESSEL  04/29/2019  . IR ANGIOGRAM VISCERAL SELECTIVE  04/29/2019  . IR EMBO ART  VEN HEMORR LYMPH EXTRAV  INC GUIDE ROADMAPPING  04/29/2019  . IR US GUIDE VASC ACCESS RIGHT  04/29/2019  . LUNG BIOPSY       reports that he quit smoking about 6 years ago. His smoking use included cigarettes. He has a 90.00 pack-year smoking history. He has never used smokeless tobacco. He reports that he does not drink alcohol or use drugs.  Allergies  Allergen Reactions  . Levaquin [Levofloxacin In D5w] Other (See Comments)    Unknown rxn  . Lisinopril Swelling    Family History  Problem Relation Age of Onset  .  Heart disease Mother   . Cancer Neg Hx     Prior to Admission medications   Medication Sig Start Date End Date Taking? Authorizing Provider  albuterol (PROAIR HFA) 108 (90 BASE) MCG/ACT inhaler INHALE 2 PUFFS BY MOUTH EVERY 4 HOURS AS NEEDED FOR WHEEZING Patient taking differently: Inhale 2 puffs into the lungs every 4 (four) hours as needed for wheezing.  03/13/15  Yes Tanda Rockers,  MD  allopurinol (ZYLOPRIM) 300 MG tablet Take 300 mg by mouth daily. 04/07/19  Yes [provider]  amLODipine (NORVASC) 10 MG tablet Take 10 mg by mouth daily. 05/07/19  Yes [provider]  atorvastatin (LIPITOR) 20 MG tablet Take 1 tablet (20 mg total) by mouth daily. 09/26/16  Yes Tanda Rockers, MD  Cholecalciferol (VITAMIN D3) 50 MCG (2000 UT) capsule Take 2,000 Units by mouth daily.  05/07/19  Yes [provider]  ferrous sulfate 325 (65 FE) MG tablet Take 1 tablet (325 mg total) by mouth every Monday, Wednesday, and Friday. 08/11/19 03/08/20 Yes Rai, Ripudeep K, MD  folic acid (FOLVITE) 1 MG tablet Take 1 tablet (1 mg total) by mouth daily. 04/09/19 04/08/20 Yes Donne Hazel, MD  metFORMIN (GLUCOPHAGE) 1000 MG tablet Take 0.5 tablets (500 mg total) by mouth 2 (two) times daily. 05/01/19  Yes Florencia Reasons, MD  Multiple Vitamin (MULTIVITAMIN WITH MINERALS) TABS tablet Take 1 tablet by mouth daily. Over the counter 08/12/19  Yes Rai, Ripudeep K, MD  naproxen (NAPROSYN) 500 MG tablet Take 500 mg by mouth 2 (two) times daily as needed for moderate pain.   Yes [provider]  pantoprazole (PROTONIX) 40 MG tablet Take 1 tablet (40 mg total) by mouth 2 (two) times daily before a meal. 05/28/19  Yes Nandigam, Venia Minks, MD  QUEtiapine (SEROQUEL) 25 MG tablet Take 1 tablet (25 mg total) by mouth 2 (two) times daily. 08/11/19  Yes Rai, Ripudeep K, MD  senna-docusate (SENOKOT-S) 8.6-50 MG tablet Take 1 tablet by mouth at bedtime. 05/01/19  Yes Florencia Reasons, MD  sucralfate (CARAFATE) 1 g tablet Take 1 g by mouth 4 (four) times daily.  06/23/19  Yes [provider]  SYMBICORT 160-4.5 MCG/ACT inhaler Inhale 2 puffs into the lungs 2 (two) times daily. 07/20/19  Yes [provider]  traMADol (ULTRAM) 50 MG tablet Take 1 tablet (50 mg total) by mouth every 6 (six) hours as needed. Patient taking differently: Take 50 mg by mouth every 6 (six) hours as needed for moderate pain or  severe pain.  08/25/19  Yes Drenda Freeze, MD  vitamin B-12 1000 MCG tablet Take 1 tablet (1,000 mcg total) by mouth daily. 08/12/19  Yes Rai, Ripudeep K, MD  Vitamin D, Ergocalciferol, (DRISDOL) 1.25 MG (50000 UT) CAPS capsule Take 50,000 Units by mouth every Friday.  03/10/19  Yes [provider]    Physical Exam: Constitutional: Moderately built and nourished. Vitals:   09/01/19 2300 09/01/19 2308 09/02/19 0100 09/02/19 0206  BP: 140/76  (!) 145/68 135/64  Pulse: 76  96 89  Resp: 16  (!) 24 (!) 23  Temp:      SpO2: (!) 86% 98% 98% 99%   Eyes: Anicteric no pallor. ENMT: No discharge from the ears eyes nose or mouth. Neck: No mass or.  No neck rigidity. Respiratory: No rhonchi or crepitations. Cardiovascular: S1-S2 heard. Abdomen: Mild epigastric tenderness no guarding or rigidity. Musculoskeletal: No edema. Skin: No rash. Neurologic: Alert awake oriented to his name and  place moves all extremities. Psychiatric: Oriented to his name and place.   Labs on Admission: I have personally reviewed following labs and imaging studies  CBC: Recent Labs  Lab 09/01/19 2250  WBC 10.3  NEUTROABS 8.5*  HGB 9.4*  HCT 29.7*  MCV 98.0  PLT 299   Basic Metabolic Panel: Recent Labs  Lab 09/01/19 2250  NA 142  K 2.7*  CL 100  CO2 27  GLUCOSE 117*  BUN 25*  CREATININE 2.03*  CALCIUM 9.8  MG 1.1*   GFR: Estimated Creatinine Clearance: 32.5 mL/min (A) (by C-G formula based on SCr of 2.03 mg/dL (H)). Liver Function Tests: Recent Labs  Lab 09/01/19 2250  AST 15  ALT 10  ALKPHOS 61  BILITOT 0.6  PROT 8.0  ALBUMIN 3.3*   No results for input(s): LIPASE, AMYLASE in the last 168 hours. No results for input(s): AMMONIA in the last 168 hours. Coagulation Profile: No results for input(s): INR, PROTIME in the last 168 hours. Cardiac Enzymes: No results for input(s): CKTOTAL, CKMB, CKMBINDEX, TROPONINI in the last 168 hours. BNP (last 3 results) No results for  input(s): PROBNP in the last 8760 hours. HbA1C: No results for input(s): HGBA1C in the last 72 hours. CBG: No results for input(s): GLUCAP in the last 168 hours. Lipid Profile: No results for input(s): CHOL, HDL, LDLCALC, TRIG, CHOLHDL, LDLDIRECT in the last 72 hours. Thyroid Function Tests: No results for input(s): TSH, T4TOTAL, FREET4, T3FREE, THYROIDAB in the last 72 hours. Anemia Panel: No results for input(s): VITAMINB12, FOLATE, FERRITIN, TIBC, IRON, RETICCTPCT in the last 72 hours. Urine analysis:    Component Value Date/Time   COLORURINE AMBER (A) 09/01/2019 2327   APPEARANCEUR HAZY (A) 09/01/2019 2327   LABSPEC 1.020 09/01/2019 2327   PHURINE 5.0 09/01/2019 2327   GLUCOSEU NEGATIVE 09/01/2019 2327   HGBUR NEGATIVE 09/01/2019 2327   BILIRUBINUR NEGATIVE 09/01/2019 2327   KETONESUR NEGATIVE 09/01/2019 2327   PROTEINUR 30 (A) 09/01/2019 2327   UROBILINOGEN 1.0 02/22/2012 0238   NITRITE NEGATIVE 09/01/2019 2327   LEUKOCYTESUR TRACE (A) 09/01/2019 2327   Sepsis Labs: @LABRCNTIP (procalcitonin:4,lacticidven:4) )No results found for this or any previous visit (from the past 240 hour(s)).   Radiological Exams on Admission: No results found.  EKG: Independently reviewed.  Normal sinus rhythm.  Assessment/Plan Principal Problem:   ARF (acute renal failure) (HCC) Active Problems:   HTN (hypertension)   CAD (coronary artery disease)   Pulmonary HTN (HCC)   COPD II/III with reversibility    Primary cancer of right lower lobe of lung ([place])   Vascular dementia without behavioral disturbance (HCC)   Hypokalemia   Hypomagnesemia    1. Abdominal pain with nausea vomiting -cause not clear.  Acute abdominal series was unremarkable LFTs are normal.  On exam patient has mild epigastric tenderness.  Will get a CT abdomen. 2. Acute renal failure with hypokalemia and hypomagnesemia likely from poor oral intake follow CT abdomen.  Gentle hydration for now.  UA shows granular and  hyaline cast.  WBC 6-10 with trace leukocyte esterase.  Will check a urine culture.  Replace potassium and magnesium and recheck. 3. History of diabetes mellitus type 2 we will keep patient on sliding scale coverage. 4. Hypertension on amlodipine. 5. Anemia appears to be chronic follow CBC.  On iron folate and B12 supplements. 6. Diabetes mellitus type 2 we will keep patient on sliding scale coverage. 7. Recurrent lung cancer on radiotherapy last one was about last month. 8. COPD not  actively wheezing. 9. History of CAD denies any chest pain.   DVT prophylaxis: Lovenox. Code Status: Full code. Family Communication: Patient's daughter. Disposition Plan: To be determined. Consults called: Palliative care. Admission status: Observation.   Rise Patience MD [facility] Pager [phone].  If 7PM-7AM, please contact night-coverage [email or web address] Password [email or web address]  09/02/2019, 2:21 AM

## 2019-09-02 NOTE — ED Notes (Signed)
Report given to Calvin, RN.

## 2019-09-02 NOTE — Progress Notes (Signed)
Patient refused to have vital signs and blood sugar checked this evening. RN educated patient on plan of care, pt still declined and stated he wanted to get some rest. Patient heart rate/rythym and respirations are within normal limits on monitor. Patient has no complaints and appears to be in no distress.

## 2019-09-03 ENCOUNTER — Encounter: Payer: Self-pay | Admitting: Radiation Oncology

## 2019-09-03 ENCOUNTER — Ambulatory Visit
Admission: RE | Admit: 2019-09-03 | Discharge: 2019-09-03 | Disposition: A | Discharge status: Home / Self Care | Payer: Medicare Other | Source: Ambulatory Visit | Attending: Radiation Oncology | Admitting: Radiation Oncology

## 2019-09-03 DIAGNOSIS — Z8249 Family history of ischemic heart disease and other diseases of the circulatory system: Secondary | ICD-10-CM | POA: Diagnosis not present

## 2019-09-03 DIAGNOSIS — Z20828 Contact with and (suspected) exposure to other viral communicable diseases: Secondary | ICD-10-CM | POA: Diagnosis present

## 2019-09-03 DIAGNOSIS — N179 Acute kidney failure, unspecified: Secondary | ICD-10-CM | POA: Diagnosis present

## 2019-09-03 DIAGNOSIS — Z7901 Long term (current) use of anticoagulants: Secondary | ICD-10-CM | POA: Diagnosis not present

## 2019-09-03 DIAGNOSIS — Z515 Encounter for palliative care: Secondary | ICD-10-CM

## 2019-09-03 DIAGNOSIS — E876 Hypokalemia: Secondary | ICD-10-CM | POA: Diagnosis present

## 2019-09-03 DIAGNOSIS — J449 Chronic obstructive pulmonary disease, unspecified: Secondary | ICD-10-CM | POA: Diagnosis present

## 2019-09-03 DIAGNOSIS — M109 Gout, unspecified: Secondary | ICD-10-CM | POA: Diagnosis present

## 2019-09-03 DIAGNOSIS — C3431 Malignant neoplasm of lower lobe, right bronchus or lung: Secondary | ICD-10-CM | POA: Diagnosis present

## 2019-09-03 DIAGNOSIS — I252 Old myocardial infarction: Secondary | ICD-10-CM | POA: Diagnosis not present

## 2019-09-03 DIAGNOSIS — E119 Type 2 diabetes mellitus without complications: Secondary | ICD-10-CM | POA: Diagnosis present

## 2019-09-03 DIAGNOSIS — Z6834 Body mass index (BMI) 34.0-34.9, adult: Secondary | ICD-10-CM | POA: Diagnosis not present

## 2019-09-03 DIAGNOSIS — Z87891 Personal history of nicotine dependence: Secondary | ICD-10-CM | POA: Diagnosis not present

## 2019-09-03 DIAGNOSIS — I110 Hypertensive heart disease with heart failure: Secondary | ICD-10-CM | POA: Diagnosis present

## 2019-09-03 DIAGNOSIS — R627 Adult failure to thrive: Secondary | ICD-10-CM | POA: Diagnosis present

## 2019-09-03 DIAGNOSIS — Z7984 Long term (current) use of oral hypoglycemic drugs: Secondary | ICD-10-CM | POA: Diagnosis not present

## 2019-09-03 DIAGNOSIS — Z7189 Other specified counseling: Secondary | ICD-10-CM

## 2019-09-03 DIAGNOSIS — I10 Essential (primary) hypertension: Secondary | ICD-10-CM | POA: Diagnosis not present

## 2019-09-03 DIAGNOSIS — E785 Hyperlipidemia, unspecified: Secondary | ICD-10-CM | POA: Diagnosis present

## 2019-09-03 DIAGNOSIS — Z510 Encounter for antineoplastic radiation therapy: Secondary | ICD-10-CM | POA: Insufficient documentation

## 2019-09-03 DIAGNOSIS — I272 Pulmonary hypertension, unspecified: Secondary | ICD-10-CM | POA: Diagnosis present

## 2019-09-03 DIAGNOSIS — Z791 Long term (current) use of non-steroidal anti-inflammatories (NSAID): Secondary | ICD-10-CM | POA: Diagnosis not present

## 2019-09-03 DIAGNOSIS — I5032 Chronic diastolic (congestive) heart failure: Secondary | ICD-10-CM | POA: Diagnosis present

## 2019-09-03 DIAGNOSIS — F0150 Vascular dementia without behavioral disturbance: Secondary | ICD-10-CM | POA: Diagnosis present

## 2019-09-03 DIAGNOSIS — D509 Iron deficiency anemia, unspecified: Secondary | ICD-10-CM | POA: Diagnosis present

## 2019-09-03 DIAGNOSIS — I2510 Atherosclerotic heart disease of native coronary artery without angina pectoris: Secondary | ICD-10-CM

## 2019-09-03 DIAGNOSIS — Z79899 Other long term (current) drug therapy: Secondary | ICD-10-CM | POA: Diagnosis not present

## 2019-09-03 LAB — GLUCOSE, CAPILLARY
Glucose-Capillary: 77 mg/dL (ref 70–99)
Glucose-Capillary: 93 mg/dL (ref 70–99)
Glucose-Capillary: 92 mg/dL (ref 70–99)
Glucose-Capillary: 93 mg/dL (ref 70–99)
Glucose-Capillary: 90 mg/dL (ref 70–99)

## 2019-09-03 LAB — CBC WITH DIFFERENTIAL/PLATELET
Abs Immature Granulocytes: 0.05 10*3/uL (ref 0.00–0.07)
Basophils Absolute: 0 10*3/uL (ref 0.0–0.1)
Basophils Relative: 0 %
Eosinophils Absolute: 0.2 10*3/uL (ref 0.0–0.5)
Eosinophils Relative: 3 %
HCT: 26.8 % — ABNORMAL LOW (ref 39.0–52.0)
Hemoglobin: 8.4 g/dL — ABNORMAL LOW (ref 13.0–17.0)
Immature Granulocytes: 1 %
Lymphocytes Relative: 7 %
Lymphs Abs: 0.5 10*3/uL — ABNORMAL LOW (ref 0.7–4.0)
MCH: 31 pg (ref 26.0–34.0)
MCHC: 31.3 g/dL (ref 30.0–36.0)
MCV: 98.9 fL (ref 80.0–100.0)
Monocytes Absolute: 0.6 10*3/uL (ref 0.1–1.0)
Monocytes Relative: 9 %
Neutro Abs: 5.3 10*3/uL (ref 1.7–7.7)
Neutrophils Relative %: 80 %
Platelets: 319 10*3/uL (ref 150–400)
RBC: 2.71 MIL/uL — ABNORMAL LOW (ref 4.22–5.81)
RDW: 15 % (ref 11.5–15.5)
WBC: 6.6 10*3/uL (ref 4.0–10.5)
nRBC: 0 % (ref 0.0–0.2)

## 2019-09-03 LAB — COMPREHENSIVE METABOLIC PANEL
ALT: 9 U/L (ref 0–44)
AST: 16 U/L (ref 15–41)
Albumin: 2.7 g/dL — ABNORMAL LOW (ref 3.5–5.0)
Alkaline Phosphatase: 49 U/L (ref 38–126)
Anion gap: 11 (ref 5–15)
BUN: 15 mg/dL (ref 8–23)
CO2: 27 mmol/L (ref 22–32)
Calcium: 8.8 mg/dL — ABNORMAL LOW (ref 8.9–10.3)
Chloride: 105 mmol/L (ref 98–111)
Creatinine, Ser: 1.26 mg/dL — ABNORMAL HIGH (ref 0.61–1.24)
GFR calc Af Amer: >60 mL/min (ref >60)
GFR calc non Af Amer: 56 mL/min — ABNORMAL LOW (ref >60)
Glucose, Bld: 82 mg/dL (ref 70–99)
Potassium: 3 mmol/L — ABNORMAL LOW (ref 3.5–5.1)
Sodium: 143 mmol/L (ref 135–145)
Total Bilirubin: 0.6 mg/dL (ref 0.3–1.2)
Total Protein: 6.5 g/dL (ref 6.5–8.1)

## 2019-09-03 LAB — URINE CULTURE: Culture: <10000 — AB

## 2019-09-03 LAB — MAGNESIUM: Magnesium: 1.7 mg/dL (ref 1.7–2.4)

## 2019-09-03 MED ORDER — SODIUM CHLORIDE 0.9 % IV SOLN
510.0000 mg | Freq: Once | INTRAVENOUS | Status: AC
  Administered 2019-09-03: 510 mg via INTRAVENOUS
  Filled 2019-09-03: qty 17

## 2019-09-03 MED ORDER — MAGNESIUM SULFATE 4 GM/100ML IV SOLN
4.0000 g | Freq: Once | INTRAVENOUS | Status: AC
  Administered 2019-09-03: 12:00:00 4 g via INTRAVENOUS
  Filled 2019-09-03: qty 100

## 2019-09-03 MED ORDER — LORAZEPAM 2 MG/ML IJ SOLN: 0.5000 mg | Freq: Four times a day (QID) | INTRAMUSCULAR | Status: DC | PRN

## 2019-09-03 MED ORDER — POTASSIUM CHLORIDE CRYS ER 20 MEQ PO TBCR
40.0000 meq | EXTENDED_RELEASE_TABLET | ORAL | Status: AC
  Administered 2019-09-03 (×2): 40 meq via ORAL
  Filled 2019-09-03 (×2): qty 2

## 2019-09-03 MED ORDER — LORAZEPAM 2 MG/ML IJ SOLN
0.5000 mg | Freq: Three times a day (TID) | INTRAMUSCULAR | Status: DC | PRN
  Filled 2019-09-03: qty 1

## 2019-09-03 NOTE — Progress Notes (Signed)
Patient has not had much appetite today and eaten very little.  Encouraged patient's PO intake but with little results.  Patient has been very confused off and on today, oriented only to self but appropriate, although occasionally he has said some strange off-the-wall remarks throughout the day.  He gets agitated at times but is easily reoriented and soothed.  He has been napping on and off today but has not been lethargic and easily arouses.  Will continue to monitor.

## 2019-09-03 NOTE — Progress Notes (Addendum)
  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  The patient experienced some esophagitis characterized as mild.  The patient also noted fatigue.  Plan: The patient has completed radiation treatment. The patient will return to radiation oncology clinic for routine followup in one month. I advised him to call or return sooner if he has any questions or concerns related to his recovery or treatment.  ________________________________  Sheral Apley. Tammi Klippel, M.D.

## 2019-09-03 NOTE — Progress Notes (Signed)
Spoke with floor nurse caring for patient today. She confirms the patient is in stable condition. Explained patient's final radiation treatment is scheduled for 0950 today. Nurse verbalized understanding. Informed Hannah, RT on L1 the patient is stable for treatment.

## 2019-09-03 NOTE — Consult Note (Signed)
Consultation Note Date: 09/03/2019   Patient Name: John [name]  DOB: 1946-02-20  MRN: [id number]  Age / Sex: 73 y.o., male   PCP: [name], NP Referring Physician: Eugenie Filler, MD   REASON FOR CONSULTATION:Establishing goals of care  Palliative Care consult requested for this 73 y.o. 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 Patient presented to ED from home as daughter had concerns with patient's poor appetite, epigastric discomfort, and nausea. Patient was recently discharged 3 weeks ago for encephalopathy and renal failure. At that time he was found to be orthostatic and his antihypertensive medications were discontinued. During his ED work-up acute abdominal images unremarkable. Potassium 2.7, BUN 25, Cr 2.03. Since admission patient has received electrolyte replacement. Palliative Medicine team consulted for goals of care discussion.   Clinical Assessment and Goals of Care: I have reviewed medical records including lab results, imaging, Epic notes, and MAR, received report from the bedside RN, and assessed the patient. I met at the bedside with patient and via phone with his daughter John [name]) to discuss diagnosis prognosis, GOC, EOL wishes, disposition and options. Patient is awake and alert. There is some question of dementia. He was able to answer all presented questions appropriately. Denies pain or shortness of breath.   I introduced Palliative Medicine as specialized medical care for people living with serious illness. It focuses on providing relief from the symptoms and stress of a serious illness. The goal is to improve quality of life for both the patient and the family.  We discussed  a brief life review of the patient, along with his functional and nutritional status. Mr. Gedney reports he is divorced.  He states John [name] is his only child who he current lives with.  He is a retired Engineer, drilling.  He enjoys watching sports, and spending time with his family and friends.  Prior to admission he reports been able to perform most ADLs independently with some assistance when he is short of breath or fatigued.  He states his appetite is up and down but for several weeks prior to admission he did not have much of an appetite and was more tired than anything.  He uses a cane or walker for ambulation assistance, stating more so when he is walking longer distances. John [name] expressed she was most concerned with patient's overall weakness and poor appetite. She denies any noticeable weight loss however patient would eat one meal a day and would often not finished that particular meal due to lack of appetite. She reports she would assist with ADLs if he was having a "bad day" being tired. Reports patient was sleeping more several days prior to admission with no interest in socializing.   We discussed His current illness and what it means in the larger context of His on-going co-morbidities. With specific discussions regarding his recurrent cancer, acute renal failure, dementia, and his overall functional and nutritional decline. Natural disease trajectory and expectations at EOL were discussed. John [name] verbalizes understanding and updates. She states she is more so concerned about his poor po intake and weakness. She is concerned if he will require rehab before returning home to assist with him regaining strength. She is inquiring about him being evaluated and participating in therapy while hospitalized. I attempted to discuss his vascular  dementia and also cancer (radiation effects) regarding his weakness and decreased appetite. John [name] verbalized understanding but also expressing her hopefulness for  improvement/stability.   I attempted to elicit values and goals of care important to the patient.    The difference between aggressive medical intervention and comfort care was considered in light of the patient's goals of care. Patient states "I will do whatever it is I need to do. My daughter is the boss of all that!" John [name] again states her wishes and goals for her father is to hopefully show some improvement in his appetite and energy level. She states "he has beat cancer once".   Patient does not have an advanced directive. John [name] and patient both reports she is his POA, however outside of that there is not any further documentation regarding patient's wishes. I discuss in detail patient's full code status with consideration to his current illness and co morbidities. Patient states "I don't want any of that. Don't put me on nothing or no machines to keep me alive!" Daughter however states this is the first time they have needed to discuss this and she is not comfortable making a decision right now. She states she would like to sit down with her dad allowing them time together to further discuss. She does state if he continues to express DNR/DNI as his wishes she would then support him, but until then she would like full scope aggressive medical interventions. We discussed in the event patient's appetite remained poor what the options would be with specific discussions regarding comfort approach versus a feeding (PEG ) tube. Daughter states she is unsure and again not willing to make a decision. She states "this would be something to discuss when the time comes while thinking about everything he has going on". Encouraged daughter to strongly consider what her father would want and quality of life. We discussed importance of while remaining hopeful also preparing for the worst and making some decisions before hand to eliminate stress and emotions in an urgent situation. She verbalized understanding.    Hospice and Palliative Care services outpatient were explained and offered. Patient and family verbalized their understanding and awareness of both palliative and hospice's goals and philosophy of care. Daughter and patient both expressed wishes for outpatient palliative support at discharge.   Questions and concerns were addressed. Daughter, John [name] was encouraged to call with questions or concerns.  PMT will continue to support holistically.   SOCIAL HISTORY:     reports that he quit smoking about 6 years ago. His smoking use included cigarettes. He has a 90.00 pack-year smoking history. He has never used smokeless tobacco. He reports that he does not drink alcohol or use drugs.  CODE STATUS: Full code  ADVANCE DIRECTIVES: Conan Bowens (daughter/POA)    SYMPTOM MANAGEMENT: per attending   Palliative Prophylaxis:   Aspiration, Bowel Regimen, Delirium Protocol, Frequent Pain Assessment and Oral Care  PSYCHO-SOCIAL/SPIRITUAL:  Support System: Family   Desire for further Chaplaincy support:NO   Additional Recommendations (Limitations, Scope, Preferences):  Full Scope Treatment   PAST MEDICAL HISTORY: Past Medical History:  Diagnosis Date  . B12 deficiency   . CHF (congestive heart failure) ([place])   . COPD (chronic obstructive pulmonary disease) ([place])   . Diabetes mellitus without complication ([place])   . Folate deficiency   . Gout   . Hyperlipemia   . Hypertension   . Lung cancer ([place])    squamous cell carcinoma of right lower lobe lung  . MI (myocardial  infarction) ([place])   . Pulmonary hypertension ([place])     PAST SURGICAL HISTORY:  Past Surgical History:  Procedure Laterality Date  . APPENDECTOMY    . BIOPSY  04/21/2019   Procedure: BIOPSY;  Surgeon: Gatha Mayer, MD;  Location: [facility];  Service: Endoscopy;;  . ESOPHAGOGASTRODUODENOSCOPY (EGD) WITH PROPOFOL N/A 04/21/2019   Procedure: ESOPHAGOGASTRODUODENOSCOPY (EGD) WITH PROPOFOL;  Surgeon: Gatha Mayer, MD;  Location: [facility];  Service: Endoscopy;  Laterality: N/A;  . ESOPHAGOGASTRODUODENOSCOPY (EGD) WITH PROPOFOL N/A 04/29/2019   Procedure: ESOPHAGOGASTRODUODENOSCOPY (EGD) WITH PROPOFOL;  Surgeon: Mauri Pole, MD;  Location: [facility];  Service: Endoscopy;  Laterality: N/A;  . HEMOSTASIS CLIP PLACEMENT  04/29/2019   Procedure: HEMOSTASIS CLIP PLACEMENT;  Surgeon: Mauri Pole, MD;  Location: [facility];  Service: Endoscopy;;  Clip placed as marker not for bleeding control  . IR ANGIOGRAM SELECTIVE EACH ADDITIONAL VESSEL  04/29/2019  . IR ANGIOGRAM SELECTIVE EACH ADDITIONAL VESSEL  04/29/2019  . IR ANGIOGRAM SELECTIVE EACH ADDITIONAL VESSEL  04/29/2019  . IR ANGIOGRAM VISCERAL SELECTIVE  04/29/2019  . IR EMBO ART  VEN HEMORR LYMPH EXTRAV  INC GUIDE ROADMAPPING  04/29/2019  . IR US GUIDE VASC ACCESS RIGHT  04/29/2019  . LUNG BIOPSY      ALLERGIES:  is allergic to levaquin [levofloxacin in d5w] and lisinopril.   MEDICATIONS:  Current Facility-Administered Medications  Medication Dose Route Frequency Provider Last Rate Last Dose  . acetaminophen (TYLENOL) tablet 650 mg  650 mg Oral Q6H PRN Rise Patience, MD       Or  . acetaminophen (TYLENOL) suppository 650 mg  650 mg Rectal Q6H PRN Rise Patience, MD      . albuterol (VENTOLIN HFA) 108 (90 Base) MCG/ACT inhaler 2 puff  2 puff Inhalation Q4H PRN Rise Patience, MD      . allopurinol (ZYLOPRIM) tablet 300 mg  300 mg Oral Daily Rise Patience, MD   300 mg at 09/03/19 8563  . amLODipine (NORVASC) tablet 10 mg  10 mg Oral Daily Rise Patience, MD   10 mg at 09/03/19 1497  . atorvastatin (LIPITOR) tablet 20 mg  20 mg Oral q1800 Rise Patience, MD   20 mg at 09/02/19 1709  . enoxaparin (LOVENOX) injection 40 mg  40 mg Subcutaneous Daily Graylin Shiver L, RPH   40 mg at 09/02/19 1054  . ferrous sulfate tablet 325 mg  325 mg Oral Q M,W,F Rise Patience, MD   325 mg at 09/03/19 0263  .  folic acid (FOLVITE) tablet 1 mg  1 mg Oral Daily Rise Patience, MD   1 mg at 09/03/19 7858  . hydrALAZINE (APRESOLINE) injection 10 mg  10 mg Intravenous Q6H PRN Bodenheimer, Charles A, NP      . insulin aspart (novoLOG) injection 0-9 Units  0-9 Units Subcutaneous TID WC Rise Patience, MD      . lactated ringers infusion   Intravenous Continuous Eugenie Filler, MD 100 mL/hr at 09/03/19 1206    . magnesium sulfate IVPB 4 g 100 mL  4 g Intravenous Once Eugenie Filler, MD 50 mL/hr at 09/03/19 1205 4 g at 09/03/19 1205  . mometasone-formoterol (DULERA) 200-5 MCG/ACT inhaler 2 puff  2 puff Inhalation BID Rise Patience, MD   2 puff at 09/03/19 [phone]  . pantoprazole (PROTONIX) EC tablet 40 mg  40 mg Oral BID AC Rise Patience, MD   40 mg at  09/03/19 3790  . potassium chloride SA (K-DUR) CR tablet 40 mEq  40 mEq Oral Q4H Eugenie Filler, MD   40 mEq at 09/03/19 2409  . QUEtiapine (SEROQUEL) tablet 25 mg  25 mg Oral BID Rise Patience, MD   25 mg at 09/03/19 7353  . senna-docusate (Senokot-S) tablet 1 tablet  1 tablet Oral QHS Rise Patience, MD   1 tablet at 09/02/19 2105  . sucralfate (CARAFATE) tablet 1 g  1 g Oral QID Rise Patience, MD   1 g at 09/03/19 2992  . traMADol (ULTRAM) tablet 50 mg  50 mg Oral Q6H PRN Rise Patience, MD   50 mg at 09/02/19 1709  . vitamin B-12 (CYANOCOBALAMIN) tablet 1,000 mcg  1,000 mcg Oral Daily Rise Patience, MD   1,000 mcg at 09/03/19 4268    VITAL SIGNS: BP (!) 141/66 (BP Location: Right Arm)   Pulse 90   Temp 98.2 F (36.8 C) (Oral)   Resp 16   Ht 5' 4.02" (1.626 m)   Wt 91 kg   SpO2 91%   BMI 34.42 kg/m  Filed Weights   09/02/19 1533  Weight: 91 kg    Estimated body mass index is 34.42 kg/m as calculated from the following:   Height as of this encounter: 5' 4.02" (1.626 m).   Weight as of this encounter: 91 kg.  LABS: CBC:    Component Value Date/Time   WBC 6.6 09/03/2019 0416    HGB 8.4 (L) 09/03/2019 0416   HGB 9.5 (L) 08/05/2019 0925   HGB 13.2 10/04/2016 1354   HCT 26.8 (L) 09/03/2019 0416   HCT 41.0 10/04/2016 1354   PLT 319 09/03/2019 0416   PLT 228 08/05/2019 0925   PLT 212 10/04/2016 1354   Comprehensive Metabolic Panel:    Component Value Date/Time   NA 143 09/03/2019 0416   NA 143 10/04/2016 1354   K 3.0 (L) 09/03/2019 0416   K 3.9 10/04/2016 1354   CO2 27 09/03/2019 0416   CO2 25 10/04/2016 1354   BUN 15 09/03/2019 0416   BUN 11.6 10/04/2016 1354   CREATININE 1.26 (H) 09/03/2019 0416   CREATININE 1.48 (H) 08/05/2019 0925   CREATININE 1.3 10/04/2016 1354   ALBUMIN 2.7 (L) 09/03/2019 0416   ALBUMIN 3.8 10/04/2016 1354     Review of Systems  Constitutional: Positive for appetite change.  Respiratory: Positive for shortness of breath.   Neurological: Positive for weakness.  Unless otherwise noted, a complete review of systems is negative.  Physical Exam General: NAD, Chronically-ill appearing Cardiovascular: regular rate and rhythm Pulmonary: diminished bases Abdomen: soft, nontender, + bowel sounds Extremities: no edema, no joint deformities Skin: no rashes, warm, dry, intact  Neurological: awake, alert and oriented to all questions presented (appropriately specified, name, dob, location, city, president)   Prognosis: Unable to determine-Guarded in the setting of recurrent NSCLC, deconditioning, poor po intake, acute renal failure, diabetes, hypertension, anemia, and COPD.   Discharge Planning:  To Be Determined with outpatient palliative at minimum.   Recommendations:  Full Code-patient stated he would not want heroic measures, however daughter reports she needs to discuss with him further face to face given this is the fist time they have discussed.   Continue with current plan of care per medical team  Daughter remains hopeful patient will show some signs of improvement and increased appetite. Also concerned with his weakness  and the need for therapy. PT referral placed.  Outpatient Palliative support at discharge (referral placed)  PMT will continue to support and follow    Palliative Performance Scale: PPS 30%              Patient and daughter expressed understanding and was in agreement with this plan.   Thank you for allowing the Palliative Medicine Team to assist in the care of this patient.  Time In: 1315 Time Out: 1420 Time Total: 65 min.   Visit consisted of counseling and education dealing with the complex and emotionally intense issues of symptom management and palliative care in the setting of serious and potentially life-threatening illness.Greater than 50%  of this time was spent counseling and coordinating care related to the above assessment and plan.  Signed by:  Alda Lea, AGPCNP-BC Palliative Medicine Team  Phone: [phone] Fax: [phone] Pager: [phone] Amion: Bjorn Pippin

## 2019-09-03 NOTE — Progress Notes (Addendum)
PROGRESS NOTE    John [name]  John [name]:[id number] DOB: September 24, 1946 DOA: 09/01/2019 PCP: [name], NP    Brief Narrative:  HPI per Dr. Gilmore Laroche is a 73 y.o. male with history of recurrent non-small cell lung cancer received chemotherapy in the first week of August last month, diastolic CHF COPD diabetes mellitus, gout who was recently admitted and discharged 3 weeks ago for encephalopathy and renal failure at that time patient also was found to be orthostatic hypotensive many of patient's antihypertensives were discontinued at that time was receiving IV fluids and discharged home.  Per patient's daughter patient has benign exam poor appetite over the last 1 week and has not been eating well on questioning patient states he has been having epigastric discomfort with nausea.  Denies any diarrhea chest pain shortness of breath.  ED Course: In the ER acute abdominal series was unremarkable.  Labs revealed acute renal failure with creatinine worsening from 1 and presently is 2.  Albumin 3.3 LFTs were largely unremarkable.  Potassium and magnesium was low.  On exam patient has mild epigastric tenderness for which I have ordered CT abdomen.  Patient is started on IV fluids for acute renal failure and admitted for further management.  Assessment & Plan:   Principal Problem:   ARF (acute renal failure) (HCC) Active Problems:   HTN (hypertension)   CAD (coronary artery disease)   Pulmonary HTN (HCC)   COPD II/III with reversibility    Primary cancer of right lower lobe of lung (John [name])   Vascular dementia without behavioral disturbance (HCC)   Hypokalemia   Hypomagnesemia  1 abdominal pain with nausea and vomiting Questionable etiology.  Acute abdominal series unremarkable.  CT abdomen and pelvis unremarkable.  Patient with recent GI bleed with history of duodenal ulcer.  Patient currently asymptomatic.  Denies any abdominal pain.  Patient with no overt bleeding noted.   Hemoglobin decreased since admission however likely dilutional.  Hemoglobin has remained stable over the past 1 to 2 days.  Continue PPI twice daily, Carafate.  Continue IV fluids.  Supportive care.  2.  Iron deficiency anemia Hemoglobin 8.4 this morning from 9.4 on admission.  Hemoglobin was 10.1 on 08/11/2019.  Patient with no overt bleeding.  Anemia panel with iron level of 6, TIBC of 203, ferritin of 142, folate of 13.2, vitamin B12 of 581.  Patient with no overt bleeding.  Patient history of duodenal ulcer.  Continue PPI.  We will give IV Feraheme x1.  Continue home dose oral iron supplementation.  Follow.  3.  Acute renal failure Likely secondary to prerenal azotemia due to decreased oral intake.  Creatinine on admission was 2.03.  Creatinine was 1.00 on 08/11/2019.  Renal function trending down with hydration.  Continue IV fluids.  Follow.  4.  Hypokalemia/hypomagnesemia K. Dur 40 mEq p.o. every 4 hours x2 doses.  Magnesium sulfate 4 g IV x1.  Repeat labs in the morning.  5.  Well controlled diabetes mellitus type 2 CBG of 77 this morning.  Hemoglobin A1c was 4.6 on 08/10/2019.  Hold oral hypoglycemic agents.  Could likely discontinue oral hypoglycemic agents on discharge with close outpatient follow-up with PCP.  Sliding scale insulin.  6.  Hypertension Continue Norvasc.  7.  Recurrent lung cancer Patient receiving radiation treatment.  Outpatient follow-up with oncology.  Oncology informed of patient's admission.  8.  COPD Stable.  Continue Dulera.  9.  Coronary artery disease Stable.  Continue Lipitor, Norvasc.  Follow.  10.  Vascular dementia Continue current dose of Seroquel twice daily.  Place on Ativan as needed agitation.   DVT prophylaxis: SCDs Code Status: Full Family Communication: Updated patient.  No family at bedside. Disposition Plan: To be determined.  Likely home with home health.   Consultants:   Palliative care pending  Procedures:   CT abdomen and  pelvis 09/02/2019  Acute abdominal series 09/02/2019  Antimicrobials:   None   Subjective: Patient just returned from radiation treatment.  Denies any chest pain or shortness of breath.  Denies any abdominal pain.  Denies any hematemesis or melanotic stools.  Feeling better than on admission.  Objective: Vitals:   09/02/19 1516 09/02/19 1533 09/03/19 0640 09/03/19 0839  BP: (!) 150/72 (!) 150/72 139/65   Pulse: 96 96 85   Resp: 20 18 18    Temp: (!) 97.5 F (36.4 C) 98 F (36.7 C) 98.2 F (36.8 C)   TempSrc: Oral Oral    SpO2: 94%  94% 94%  Weight:  91 kg    Height:  5' 4.02" (1.626 m)      Intake/Output Summary (Last 24 hours) at 09/03/2019 1219 Last data filed at 09/03/2019 0133 Gross per 24 hour  Intake 621.94 ml  Output 500 ml  Net 121.94 ml   Filed Weights   09/02/19 1533  Weight: 91 kg    Examination:  General exam: Appears calm and comfortable  Respiratory system: Clear to auscultation. Respiratory effort normal. Cardiovascular system: S1 & S2 heard, RRR. No JVD, murmurs, rubs, gallops or clicks. No pedal edema. Gastrointestinal system: Abdomen is nondistended, soft and nontender. No organomegaly or masses felt. Normal bowel sounds heard. Central nervous system: Alert and oriented. No focal neurological deficits. Extremities: Symmetric 5 x 5 power. Skin: No rashes, lesions or ulcers Psychiatry: Judgement and insight appear fair. Mood & affect appropriate.     Data Reviewed: I have personally reviewed following labs and imaging studies  CBC: Recent Labs  Lab 09/01/19 2250 09/02/19 0515 09/03/19 0416  WBC 10.3 8.3 6.6  NEUTROABS 8.5*  --  5.3  HGB 9.4* 8.4* 8.4*  HCT 29.7* 27.2* 26.8*  MCV 98.0 98.2 98.9  PLT 345 327 660   Basic Metabolic Panel: Recent Labs  Lab 09/01/19 2250 09/02/19 0515 09/03/19 0416  NA 142 143 143  K 2.7* 3.4* 3.0*  CL 100 105 105  CO2 27 27 27   GLUCOSE 117* 99 82  BUN 25* 25* 15  CREATININE 2.03* 1.77* 1.26*  CALCIUM  9.8 9.0 8.8*  MG 1.1* 1.5* 1.7   GFR: Estimated Creatinine Clearance: 52.3 mL/min (A) (by C-G formula based on SCr of 1.26 mg/dL (H)). Liver Function Tests: Recent Labs  Lab 09/01/19 2250 09/03/19 0416  AST 15 16  ALT 10 9  ALKPHOS 61 49  BILITOT 0.6 0.6  PROT 8.0 6.5  ALBUMIN 3.3* 2.7*   No results for input(s): LIPASE, AMYLASE in the last 168 hours. No results for input(s): AMMONIA in the last 168 hours. Coagulation Profile: No results for input(s): INR, PROTIME in the last 168 hours. Cardiac Enzymes: No results for input(s): CKTOTAL, CKMB, CKMBINDEX, TROPONINI in the last 168 hours. BNP (last 3 results) No results for input(s): PROBNP in the last 8760 hours. HbA1C: No results for input(s): HGBA1C in the last 72 hours. CBG: Recent Labs  Lab 09/02/19 1637 09/02/19 1738 09/03/19 0757 09/03/19 0827 09/03/19 1114  GLUCAP 93 99 77 93 92   Lipid Profile: No results for input(s): CHOL, HDL, LDLCALC, TRIG,  CHOLHDL, LDLDIRECT in the last 72 hours. Thyroid Function Tests: No results for input(s): TSH, T4TOTAL, FREET4, T3FREE, THYROIDAB in the last 72 hours. Anemia Panel: Recent Labs    09/02/19 1020  VITAMINB12 581  FOLATE 13.2  FERRITIN 142  TIBC 203*  IRON 6*  RETICCTPCT 1.4   Sepsis Labs: No results for input(s): PROCALCITON, LATICACIDVEN in the last 168 hours.  Recent Results (from the past 240 hour(s))  Culture, Urine     Status: Abnormal   Collection Time: 09/01/19 11:27 PM   Specimen: Urine, Clean Catch  Result Value Ref Range Status   Specimen Description   Final    URINE, CLEAN CATCH Performed at [facility], [place] [address], [place], [place] [address]    Special Requests   Final    NONE Performed at [facility], [place] [address], [place], [place] [address]    Culture (A)  Final    <10,000 COLONIES/mL INSIGNIFICANT GROWTH Performed at [facility] [address] [place], [place] [address]    Report  Status 09/03/2019 FINAL  Final  SARS CORONAVIRUS 2 (TAT 6-24 HRS) Nasopharyngeal Nasopharyngeal Swab     Status: None   Collection Time: 09/02/19 12:41 AM   Specimen: Nasopharyngeal Swab  Result Value Ref Range Status   SARS Coronavirus 2 NEGATIVE NEGATIVE Final    Comment: (NOTE) SARS-CoV-2 target nucleic acids are NOT DETECTED. The SARS-CoV-2 RNA is generally detectable in upper and lower respiratory specimens during the acute phase of infection. Negative results do not preclude SARS-CoV-2 infection, do not rule out co-infections with other pathogens, and should not be used as the sole basis for treatment or other patient management decisions. Negative results must be combined with clinical observations, patient history, and epidemiological information. The expected result is Negative. Fact Sheet for Patients: SugarRoll.be Fact Sheet for Healthcare Providers: https://www.woods-mathews.com/ This test is not yet approved or cleared by the [place] FDA and  has been authorized for detection and/or diagnosis of SARS-CoV-2 by FDA under an Emergency Use Authorization (EUA). This EUA will remain  in effect (meaning this test can be used) for the duration of the COVID-19 declaration under Section 56 4(b)(1) of the Act, 21 U.S.C. section 360bbb-3(b)(1), unless the authorization is terminated or revoked sooner. Performed at [facility], [place] [address] [place], [place] [address]          Radiology Studies: Ct Abdomen Pelvis Wo Contrast  Result Date: 09/02/2019 CLINICAL DATA:  Abd pain, gastroenteritis or colitis suspected. Right lower lobe lung cancer. EXAM: CT ABDOMEN AND PELVIS WITHOUT CONTRAST TECHNIQUE: Multidetector CT imaging of the abdomen and pelvis was performed following the standard protocol without IV contrast. COMPARISON:  PET scan 05/26/2019 FINDINGS: Lower chest: Right lower lobe lung mass has slightly decreased in  size, now measuring 4.6 x 3.7 cm. No new lesions are present. There is mild dependent atelectasis. Heart size is normal. Coronary artery calcifications are present. Hepatobiliary: The liver is unremarkable. Prominent gallstones are present in the neck of the gallbladder. No inflammatory changes are present. The common bile duct is within normal limits. Pancreas: Vascular coils are present in the pancreatic head, unchanged. Pancreas is otherwise unremarkable. Spleen: Normal in size without focal abnormality. Adrenals/Urinary Tract: Two upper pole right kidney stones are again seen, measuring up to 7 mm. These are both nonobstructing. A punctate nonobstructing stone is present at the lower pole of the right kidney. Exophytic cyst arising from the right kidney measures  4.5 cm, stable. Left kidney is within normal limits. Ureters are unremarkable. The urinary bladder is within normal limits. Stomach/Bowel: Minimal contrast is present in the stomach. The stomach and duodenum are otherwise unremarkable. Small bowel is within normal limits. Terminal ileum is normal. The ascending and transverse colon are within normal limits. Descending and sigmoid colon are normal. Vascular/Lymphatic: Atherosclerotic calcifications are present in the aorta and branch vessels. There is no aneurysm. Reproductive: Calcifications are present within the prostate gland. Gland is normal in size. Other: No abdominal wall hernia or abnormality. No abdominopelvic ascites. Musculoskeletal: Multilevel degenerative changes are again noted in the lumbar spine. No new focal lytic or blastic lesions are present. Bony pelvis is within normal limits. Degenerative changes are noted again at the ischial tuberosities and both hips. IMPRESSION: 1. No acute or focal abnormality abdomen to explain the patient's abdominal pain. 2. Nonobstructing right-sided nephrolithiasis. 3.  Aortic Atherosclerosis (ICD10-I70.0). 4. Multilevel degenerative changes of the lumbar  spine. 5. Coronary artery disease. 6. Slight decrease in size of known right lower lobe lung mass. Electronically Signed   By: San Morelle M.D.   On: 09/02/2019 05:04   Dg Abd Acute 2+v W 1v Chest  Result Date: 09/02/2019 CLINICAL DATA:  Nausea. EXAM: DG ABDOMEN ACUTE W/ 1V CHEST COMPARISON:  CT chest abdomen pelvis 04/21/2019 FINDINGS: Right lung base opacity corresponds to right lower lobe prior CT. Upper normal heart size. No evidence of acute airspace disease. No pleural fluid. No bowel dilatation to suggest obstruction. Embolization coils in the right upper quadrant. No free air. Right upper quadrant calcifications represent gallstones as well as right renal stones. Multilevel degenerative change in the spine. IMPRESSION: 1. Nonobstructive bowel gas pattern.  No free air. 2. Embolization coils in the right upper quadrant. 3. Right renal stones and gallstones. 4. Right lung base opacities correspond to right lung mass on prior CT. Electronically Signed   By: Keith Rake M.D.   On: 09/02/2019 03:02        Scheduled Meds: . allopurinol  300 mg Oral Daily  . amLODipine  10 mg Oral Daily  . atorvastatin  20 mg Oral q1800  . enoxaparin (LOVENOX) injection  40 mg Subcutaneous Daily  . ferrous sulfate  325 mg Oral Q M,W,F  . folic acid  1 mg Oral Daily  . insulin aspart  0-9 Units Subcutaneous TID WC  . mometasone-formoterol  2 puff Inhalation BID  . pantoprazole  40 mg Oral BID AC  . potassium chloride  40 mEq Oral Q4H  . QUEtiapine  25 mg Oral BID  . senna-docusate  1 tablet Oral QHS  . sucralfate  1 g Oral QID  . cyanocobalamin  1,000 mcg Oral Daily   Continuous Infusions: . lactated ringers 100 mL/hr at 09/03/19 1206  . magnesium sulfate bolus IVPB 4 g (09/03/19 1205)     LOS: 0 days    Time spent: 35 minutes    Irine Seal, MD [facility]  If 7PM-7AM, please contact night-coverage [email or web address] 09/03/2019, 12:19 PM

## 2019-09-04 DIAGNOSIS — R627 Adult failure to thrive: Secondary | ICD-10-CM

## 2019-09-04 LAB — BASIC METABOLIC PANEL
Anion gap: 11 (ref 5–15)
BUN: 10 mg/dL (ref 8–23)
CO2: 28 mmol/L (ref 22–32)
Calcium: 8.7 mg/dL — ABNORMAL LOW (ref 8.9–10.3)
Chloride: 103 mmol/L (ref 98–111)
Creatinine, Ser: 1.1 mg/dL (ref 0.61–1.24)
GFR calc Af Amer: >60 mL/min (ref >60)
GFR calc non Af Amer: >60 mL/min (ref >60)
Glucose, Bld: 96 mg/dL (ref 70–99)
Potassium: 3.2 mmol/L — ABNORMAL LOW (ref 3.5–5.1)
Sodium: 142 mmol/L (ref 135–145)

## 2019-09-04 LAB — CBC
HCT: 27.8 % — ABNORMAL LOW (ref 39.0–52.0)
Hemoglobin: 8.7 g/dL — ABNORMAL LOW (ref 13.0–17.0)
MCH: 30.6 pg (ref 26.0–34.0)
MCHC: 31.3 g/dL (ref 30.0–36.0)
MCV: 97.9 fL (ref 80.0–100.0)
Platelets: 341 10*3/uL (ref 150–400)
RBC: 2.84 MIL/uL — ABNORMAL LOW (ref 4.22–5.81)
RDW: 14.7 % (ref 11.5–15.5)
WBC: 7.4 10*3/uL (ref 4.0–10.5)
nRBC: 0 % (ref 0.0–0.2)

## 2019-09-04 LAB — GLUCOSE, CAPILLARY
Glucose-Capillary: 83 mg/dL (ref 70–99)
Glucose-Capillary: 99 mg/dL (ref 70–99)
Glucose-Capillary: 97 mg/dL (ref 70–99)
Glucose-Capillary: 96 mg/dL (ref 70–99)

## 2019-09-04 LAB — MAGNESIUM: Magnesium: 1.8 mg/dL (ref 1.7–2.4)

## 2019-09-04 MED ORDER — POTASSIUM CHLORIDE CRYS ER 20 MEQ PO TBCR
40.0000 meq | EXTENDED_RELEASE_TABLET | Freq: Once | ORAL | Status: AC
  Administered 2019-09-04: 40 meq via ORAL
  Filled 2019-09-04: qty 2

## 2019-09-04 MED ORDER — MAGNESIUM SULFATE 2 GM/50ML IV SOLN
2.0000 g | Freq: Once | INTRAVENOUS | Status: AC
  Administered 2019-09-04: 2 g via INTRAVENOUS
  Filled 2019-09-04: qty 50

## 2019-09-04 MED ORDER — SODIUM CHLORIDE 0.9 % IV SOLN
INTRAVENOUS | Status: DC
  Administered 2019-09-04 – 2019-09-05 (×2): via INTRAVENOUS

## 2019-09-04 NOTE — Evaluation (Signed)
Physical Therapy Evaluation Patient Details Name: John [name] MRN: [id number] DOB: 10-10-1946 Today's Date: 09/04/2019   History of Present Illness  John [name] is a 73 y.o. male with history of recurrent non-small cell lung cancer received chemotherapy in the first week of August last month, diastolic CHF COPD diabetes mellitus, gout admitted with acute renal failure  Clinical Impression  Pt admitted with above diagnosis.  Pt currently with functional limitations due to the deficits listed below (see PT Problem List). Pt will benefit from skilled PT to increase their independence and safety with mobility to allow discharge to the venue listed below.  Pt's mobility is significantly decreased from his hospital stay in August.  [facility] at [facility] to MIN/guard level with chair follow for gait.  At this point recommend 24 hour S due to unsteadiness and some confusion at times. If family can provide 24 hour A then recommend HHPT, if not then recommend SNF.   Noted that palliative/hospice has met with daughter.     Follow Up Recommendations Supervision/Assistance - 24 hour;Home health PT;SNF    Equipment Recommendations  None recommended by PT    Recommendations for Other Services       Precautions / Restrictions Precautions Precautions: Fall Restrictions Weight Bearing Restrictions: No      Mobility  Bed Mobility               General bed mobility comments: Pt sitting in recliner upon arrival  Transfers Overall transfer level: Needs assistance Equipment used: Rolling walker (2 wheeled) Transfers: Sit to/from Stand Sit to Stand: Min guard;+2 safety/equipment         General transfer comment: min/guard for balance, slightly unsteady upon standing  Ambulation/Gait Ambulation/Gait assistance: +2 safety/equipment;Min guard;Min assist Gait Distance (Feet): 40 Feet Assistive device: Rolling walker (2 wheeled) Gait Pattern/deviations: Decreased step length -  left;Decreased step length - right;Antalgic Gait velocity: decreased   General Gait Details: Heavy reliance on RW with flexed knees throughout gait, chair follow for fatigue and slight unsteadiness.  Stairs            Wheelchair Mobility    Modified Rankin (Stroke Patients Only)       Balance Overall balance assessment: Needs assistance           Standing balance-Leahy Scale: Poor Standing balance comment: fair(-) static and poor dynamic                             Pertinent Vitals/Pain Pain Assessment: Faces Faces Pain Scale: Hurts a little bit Pain Location: knees Pain Descriptors / Indicators: Grimacing Pain Intervention(s): Limited activity within patient's tolerance;Monitored during session    Home Living Family/patient expects to be discharged to:: Private residence Living Arrangements: Children Available Help at Discharge: Family;Available PRN/intermittently Type of Home: House Home Access: Stairs to enter Entrance Stairs-Rails: None Entrance Stairs-Number of Steps: 1 Home Layout: One level Home Equipment: Walker - 2 wheels;Cane - single point      Prior Function Level of Independence: Independent with assistive device(s)         Comments: has both walker and cane but doesn't use them mostly. He stated if he goes into community he will take one with him     Hand Dominance   Dominant Hand: Right    Extremity/Trunk Assessment   Upper Extremity Assessment Upper Extremity Assessment: Generalized weakness    Lower Extremity Assessment Lower Extremity Assessment: Generalized weakness  Communication   Communication: No difficulties  Cognition Arousal/Alertness: Awake/alert Behavior During Therapy: WFL for tasks assessed/performed Overall Cognitive Status: Impaired/Different from baseline Area of Impairment: Memory;Orientation;Awareness                 Orientation Level: Disoriented to;Situation;Place   Memory:  Decreased short-term memory     Awareness: Emergent          General Comments      Exercises     Assessment/Plan    PT Assessment Patient needs continued PT services  PT Problem List Decreased strength;Decreased activity tolerance;Decreased balance;Decreased mobility;Decreased knowledge of use of DME       PT Treatment Interventions Gait training;Therapeutic activities;Therapeutic exercise;Patient/family education;Balance training;Functional mobility training;DME instruction    PT Goals (Current goals can be found in the Care Plan section)  Acute Rehab PT Goals PT Goal Formulation: Patient unable to participate in goal setting Time For Goal Achievement: 09/18/19 Potential to Achieve Goals: Fair    Frequency Min 3X/week   Barriers to discharge        Co-evaluation               AM-PAC PT "6 Clicks" Mobility  Outcome Measure Help needed turning from your back to your side while in a flat bed without using bedrails?: A Little Help needed moving from lying on your back to sitting on the side of a flat bed without using bedrails?: A Little Help needed moving to and from a bed to a chair (including a wheelchair)?: A Little Help needed standing up from a chair using your arms (e.g., wheelchair or bedside chair)?: A Little Help needed to walk in hospital room?: A Little Help needed climbing 3-5 steps with a railing? : A Little 6 Click Score: 18    End of Session Equipment Utilized During Treatment: Gait belt Activity Tolerance: Patient tolerated treatment well;Patient limited by fatigue Patient left: in chair;with call bell/phone within reach;with chair alarm set Nurse Communication: Mobility status PT Visit Diagnosis: Unsteadiness on feet (R26.81);Difficulty in walking, not elsewhere classified (R26.2)    Time: 9798-9211 PT Time Calculation (min) (ACUTE ONLY): 25 min   Charges:   PT Evaluation $PT Eval Moderate Complexity: 1 Mod PT Treatments $Gait  Training: 8-22 mins        Shelba Susi L. Tamala Julian, [place] Pager [phone] 09/04/2019   Galen Manila 09/04/2019, 12:53 PM

## 2019-09-04 NOTE — Progress Notes (Signed)
PROGRESS NOTE    John [name]  VFI:[id number] DOB: 1946/03/30 DOA: 09/01/2019 PCP: [name], NP    Brief Narrative:  HPI per Dr. Gilmore Laroche is a 73 y.o. male with history of recurrent non-small cell lung cancer received chemotherapy in the first week of August last month, diastolic CHF COPD diabetes mellitus, gout who was recently admitted and discharged 3 weeks ago for encephalopathy and renal failure at that time patient also was found to be orthostatic hypotensive many of patient's antihypertensives were discontinued at that time was receiving IV fluids and discharged home.  Per patient's daughter patient has benign exam poor appetite over the last 1 week and has not been eating well on questioning patient states he has been having epigastric discomfort with nausea.  Denies any diarrhea chest pain shortness of breath.  ED Course: In the ER acute abdominal series was unremarkable.  Labs revealed acute renal failure with creatinine worsening from 1 and presently is 2.  Albumin 3.3 LFTs were largely unremarkable.  Potassium and magnesium was low.  On exam patient has mild epigastric tenderness for which I have ordered CT abdomen.  Patient is started on IV fluids for acute renal failure and admitted for further management.  Assessment & Plan:   Principal Problem:   ARF (acute renal failure) (HCC) Active Problems:   HTN (hypertension)   CAD (coronary artery disease)   Pulmonary HTN (HCC)   COPD II/III with reversibility    Primary cancer of right lower lobe of lung ([place])   Vascular dementia without behavioral disturbance (HCC)   Hypokalemia   Hypomagnesemia  1 abdominal pain with nausea and vomiting Questionable etiology.  Acute abdominal series unremarkable.  CT abdomen and pelvis unremarkable.  Patient with recent GI bleed with history of duodenal ulcer.  Patient currently asymptomatic.  Denies any abdominal pain.  Patient with no overt bleeding noted.   Hemoglobin decreased since admission however likely dilutional.  Hemoglobin has remained stable over the past 1 to 2 days.  Continue PPI twice daily, Carafate.  Continue IV fluids.  Supportive care.  2.  Iron deficiency anemia Hemoglobin 8.4 this morning from 9.4 on admission.  Hemoglobin was 10.1 on 08/11/2019.  Patient with no overt bleeding.  Anemia panel with iron level of 6, TIBC of 203, ferritin of 142, folate of 13.2, vitamin B12 of 581.  Patient with no overt bleeding.  Patient history of duodenal ulcer.  Continue PPI.  Status post IV Feraheme x1.  Hemoglobin currently stable at 8.7.  Continue home dose oral iron supplementation.  Follow.  3.  Acute renal failure Likely secondary to prerenal azotemia due to decreased oral intake.  Creatinine on admission was 2.03.  Creatinine was 1.00 on 08/11/2019.  Renal function trending down with hydration.  Creatinine down to 1.10.  Continue gentle hydration as patient with poor oral intake. Follow.  4.  Hypokalemia/hypomagnesemia K. Dur 40 mEq p.o. x1.  Magnesium sulfate 2 g IV x1.   5.  Well controlled diabetes mellitus type 2 CBG of 83 this morning.  Hemoglobin A1c was 4.6 on 08/10/2019.  Hold oral hypoglycemic agents.  Could likely discontinue oral hypoglycemic agents on discharge with close outpatient follow-up with PCP.  Patient with poor oral intake.  Sliding scale insulin.  6.  Hypertension Continue Norvasc.  7.  Recurrent lung cancer Patient receiving radiation treatment.  Outpatient follow-up with oncology.  Oncology informed of patient's admission.  8.  COPD Continue Dulera.  Follow.  9.  Coronary artery disease Stable.  Continue Lipitor, Norvasc.  Follow.  10.  Vascular dementia Continue current dose of Seroquel twice daily.  Ativan as needed for agitation.  11.  Failure to thrive Patient with significantly poor oral intake.  Patient eating less than 25% of his meal.  Patient had presented with dehydration and acute renal failure  secondary to decreased volume intake.  Continue gentle hydration with IV fluids.  Patient has been seen by palliative care and family recommending full scope of treatment.  PT/OT.  Will likely need home health therapies.  Will likely need palliative care following on discharge.  Patient with high probability of rehospitalization due to decreased oral intake.   DVT prophylaxis: SCDs Code Status: Full Family Communication: Updated patient.  No family at bedside. Disposition Plan: Likely home with home health.   Consultants:   Palliative care [place] 09/03/2019  Procedures:   CT abdomen and pelvis 09/02/2019  Acute abdominal series 09/02/2019  Antimicrobials:   None   Subjective: Patient sitting up in chair breakfast tray in front of him.  Denies any chest pain no shortness of breath.  Patient is eating less than 25% of his meal.  States does not feel too hungry.  States he ate some applesauce.  Patient somewhat pleasantly confused.  Patient wanting to go home.  Objective: Vitals:   09/03/19 2009 09/03/19 2119 09/04/19 0508 09/04/19 0852  BP:  (!) 150/70 (!) 145/76   Pulse:  97 96   Resp:  20 16   Temp:  98.4 F (36.9 C) 98.7 F (37.1 C)   TempSrc:  Oral Oral   SpO2: 92% 97% 100% 93%  Weight:      Height:        Intake/Output Summary (Last 24 hours) at 09/04/2019 1056 Last data filed at 09/04/2019 0554 Gross per 24 hour  Intake 3588.09 ml  Output 1100 ml  Net 2488.09 ml   Filed Weights   09/02/19 1533  Weight: 91 kg    Examination:  General exam: NAD Respiratory system: CTAB. No wheezing, no crackles, no rhonchi.  Normal respiratory effort.  Cardiovascular system: Regular rate and rhythm no murmurs rubs or gallops.  No JVD.  No lower extremity edema.  Gastrointestinal system: Abdomen is soft, nontender, nondistended, positive bowel sounds.  No rebound.  No guarding.  Central nervous system: Alert and oriented. No focal neurological  deficits. Extremities: Symmetric 5 x 5 power. Skin: No rashes, lesions or ulcers Psychiatry: Judgement and insight appear fair. Mood & affect appropriate.     Data Reviewed: I have personally reviewed following labs and imaging studies  CBC: Recent Labs  Lab 09/01/19 2250 09/02/19 0515 09/03/19 0416 09/04/19 0433  WBC 10.3 8.3 6.6 7.4  NEUTROABS 8.5*  --  5.3  --   HGB 9.4* 8.4* 8.4* 8.7*  HCT 29.7* 27.2* 26.8* 27.8*  MCV 98.0 98.2 98.9 97.9  PLT 345 327 319 568   Basic Metabolic Panel: Recent Labs  Lab 09/01/19 2250 09/02/19 0515 09/03/19 0416 09/04/19 0433  NA 142 143 143 142  K 2.7* 3.4* 3.0* 3.2*  CL 100 105 105 103  CO2 27 27 27 28   GLUCOSE 117* 99 82 96  BUN 25* 25* 15 10  CREATININE 2.03* 1.77* 1.26* 1.10  CALCIUM 9.8 9.0 8.8* 8.7*  MG 1.1* 1.5* 1.7 1.8   GFR: Estimated Creatinine Clearance: 59.9 mL/min (by C-G formula based on SCr of 1.1 mg/dL). Liver Function Tests: Recent Labs  Lab 09/01/19 2250 09/03/19 0416  AST 15 16  ALT 10 9  ALKPHOS 61 49  BILITOT 0.6 0.6  PROT 8.0 6.5  ALBUMIN 3.3* 2.7*   No results for input(s): LIPASE, AMYLASE in the last 168 hours. No results for input(s): AMMONIA in the last 168 hours. Coagulation Profile: No results for input(s): INR, PROTIME in the last 168 hours. Cardiac Enzymes: No results for input(s): CKTOTAL, CKMB, CKMBINDEX, TROPONINI in the last 168 hours. BNP (last 3 results) No results for input(s): PROBNP in the last 8760 hours. HbA1C: No results for input(s): HGBA1C in the last 72 hours. CBG: Recent Labs  Lab 09/03/19 0827 09/03/19 1114 09/03/19 1634 09/03/19 2123 09/04/19 0727  GLUCAP 93 92 93 90 83   Lipid Profile: No results for input(s): CHOL, HDL, LDLCALC, TRIG, CHOLHDL, LDLDIRECT in the last 72 hours. Thyroid Function Tests: No results for input(s): TSH, T4TOTAL, FREET4, T3FREE, THYROIDAB in the last 72 hours. Anemia Panel: Recent Labs    09/02/19 1020  VITAMINB12 581  FOLATE  13.2  FERRITIN 142  TIBC 203*  IRON 6*  RETICCTPCT 1.4   Sepsis Labs: No results for input(s): PROCALCITON, LATICACIDVEN in the last 168 hours.  Recent Results (from the past 240 hour(s))  Culture, Urine     Status: Abnormal   Collection Time: 09/01/19 11:27 PM   Specimen: Urine, Clean Catch  Result Value Ref Range Status   Specimen Description   Final    URINE, CLEAN CATCH Performed at [facility], [place] [address] [place], [place] [address]    Special Requests   Final    NONE Performed at [facility], [place] [address] [place], [place] [address]    Culture (A)  Final    <10,000 COLONIES/mL INSIGNIFICANT GROWTH Performed at [facility] [address], [place], [place] [address]    Report Status 09/03/2019 FINAL  Final  SARS CORONAVIRUS 2 (TAT 6-24 HRS) Nasopharyngeal Nasopharyngeal Swab     Status: None   Collection Time: 09/02/19 12:41 AM   Specimen: Nasopharyngeal Swab  Result Value Ref Range Status   SARS Coronavirus 2 NEGATIVE NEGATIVE Final    Comment: (NOTE) SARS-CoV-2 target nucleic acids are NOT DETECTED. The SARS-CoV-2 RNA is generally detectable in upper and lower respiratory specimens during the acute phase of infection. Negative results do not preclude SARS-CoV-2 infection, do not rule out co-infections with other pathogens, and should not be used as the sole basis for treatment or other patient management decisions. Negative results must be combined with clinical observations, patient history, and epidemiological information. The expected result is Negative. Fact Sheet for Patients: SugarRoll.be Fact Sheet for Healthcare Providers: https://www.woods-mathews.com/ This test is not yet approved or cleared by the [place] FDA and  has been authorized for detection and/or diagnosis of SARS-CoV-2 by FDA under an Emergency Use Authorization (EUA). This EUA will remain  in  effect (meaning this test can be used) for the duration of the COVID-19 declaration under Section 56 4(b)(1) of the Act, 21 U.S.C. section 360bbb-3(b)(1), unless the authorization is terminated or revoked sooner. Performed at [facility], [place] [address] [place], [place] [address]          Radiology Studies: No results found.      Scheduled Meds: . allopurinol  300 mg Oral Daily  . amLODipine  10 mg Oral Daily  . atorvastatin  20 mg Oral q1800  . ferrous sulfate  325 mg Oral Q M,W,F  .  folic acid  1 mg Oral Daily  . insulin aspart  0-9 Units Subcutaneous TID WC  . mometasone-formoterol  2 puff Inhalation BID  . pantoprazole  40 mg Oral BID AC  . potassium chloride  40 mEq Oral Once  . QUEtiapine  25 mg Oral BID  . senna-docusate  1 tablet Oral QHS  . sucralfate  1 g Oral QID  . cyanocobalamin  1,000 mcg Oral Daily   Continuous Infusions: . magnesium sulfate bolus IVPB       LOS: 1 day    Time spent: 35 minutes    Irine Seal, MD [facility]  If 7PM-7AM, please contact night-coverage [email or web address] 09/04/2019, 10:56 AM

## 2019-09-05 DIAGNOSIS — D509 Iron deficiency anemia, unspecified: Secondary | ICD-10-CM

## 2019-09-05 DIAGNOSIS — F0150 Vascular dementia without behavioral disturbance: Secondary | ICD-10-CM

## 2019-09-05 DIAGNOSIS — E1169 Type 2 diabetes mellitus with other specified complication: Secondary | ICD-10-CM

## 2019-09-05 DIAGNOSIS — R627 Adult failure to thrive: Secondary | ICD-10-CM

## 2019-09-05 LAB — HEMOGLOBIN AND HEMATOCRIT, BLOOD
HCT: 27.5 % — ABNORMAL LOW (ref 39.0–52.0)
Hemoglobin: 8.3 g/dL — ABNORMAL LOW (ref 13.0–17.0)

## 2019-09-05 LAB — BASIC METABOLIC PANEL
Anion gap: 14 (ref 5–15)
BUN: 7 mg/dL — ABNORMAL LOW (ref 8–23)
CO2: 25 mmol/L (ref 22–32)
Calcium: 8.7 mg/dL — ABNORMAL LOW (ref 8.9–10.3)
Chloride: 104 mmol/L (ref 98–111)
Creatinine, Ser: 1.02 mg/dL (ref 0.61–1.24)
GFR calc Af Amer: >60 mL/min (ref >60)
GFR calc non Af Amer: >60 mL/min (ref >60)
Glucose, Bld: 67 mg/dL — ABNORMAL LOW (ref 70–99)
Potassium: 4 mmol/L (ref 3.5–5.1)
Sodium: 143 mmol/L (ref 135–145)

## 2019-09-05 LAB — MAGNESIUM: Magnesium: 1.7 mg/dL (ref 1.7–2.4)

## 2019-09-05 LAB — GLUCOSE, CAPILLARY
Glucose-Capillary: 67 mg/dL — ABNORMAL LOW (ref 70–99)
Glucose-Capillary: 88 mg/dL (ref 70–99)
Glucose-Capillary: 97 mg/dL (ref 70–99)

## 2019-09-05 MED ORDER — MEGESTROL ACETATE 400 MG/10ML PO SUSP: 400.0000 mg | Freq: Every day | ORAL | 1 refills | Status: DC

## 2019-09-05 MED ORDER — ACETAMINOPHEN 325 MG PO TABS: 650.0000 mg | ORAL_TABLET | Freq: Four times a day (QID) | ORAL | Status: AC | PRN

## 2019-09-05 MED ORDER — MEGESTROL ACETATE 400 MG/10ML PO SUSP
400.0000 mg | Freq: Every day | ORAL | Status: DC
  Administered 2019-09-05: 400 mg via ORAL
  Filled 2019-09-05: qty 10

## 2019-09-05 MED ORDER — MAGNESIUM SULFATE 4 GM/100ML IV SOLN
4.0000 g | Freq: Once | INTRAVENOUS | Status: AC
  Administered 2019-09-05: 4 g via INTRAVENOUS
  Filled 2019-09-05: qty 100

## 2019-09-05 NOTE — Progress Notes (Signed)
Manufacturing engineer [facility]) Community Based Palliative Care  Received request from [place] for family interest in [place] at home after discharge. Spoke with daughter Willette Cluster by phone to explain services and answer questions. She requests a call back Tuesday to schedule first visit. [place] will follow up with PCP office of Dr. [name] to make aware of referral and contact [place] to schedule first visit.   Thank you,  Erling Conte, LCSW [phone]

## 2019-09-05 NOTE — Progress Notes (Signed)
Pt arrived to floor. Pt sleeping upon arrival, but easily roused. Pt in stable condition upon arrival and resting comfortably. No needs noted at this time. Will CTM.

## 2019-09-05 NOTE — Progress Notes (Signed)
Pt transferred to room 1338 via bed.  Pt with O2@3L /[place], mask in place.  NS infusing at 75 ml/hr via pump.

## 2019-09-05 NOTE — TOC Initial Note (Signed)
Transition of Care John [name]) - Initial/Assessment Note    Patient Details  Name: John [name] MRN: [id number] Date of Birth: January 15, 1946  Transition of Care [facility]) CM/SW Contact:    John Courts, RN Phone Number: 09/05/2019, 12:07 PM  Clinical Narrative:   CM spoke with patient. Patient set up with advance home health for [name] PT, OT, RN, aide, and social work.  CM consulted for outpatient palliative referral. Referral given to John [name].                  Expected Discharge Plan: John [name] Barriers to Discharge: No Barriers Identified   Patient Goals and CMS Choice Patient states their goals for this hospitalization and ongoing recovery are:: to go home CMS Medicare.gov Compare Post Acute Care list provided to:: Patient Choice offered to / list presented to : Patient  Expected Discharge Plan and Services Expected Discharge Plan: New John [name]   Discharge Planning Services: CM Consult Post Acute Care Choice: [name] arrangements for the past 2 months: Single Family Home Expected Discharge Date: 09/05/19               DME Arranged: N/A DME Agency: NA       HH Arranged: PT, OT, Nurse's Aide, RN, Social Work [employer] Agency: John [name] ([place]) Date [place]: 09/05/19 Time [place]: 1206 Representative spoke with at John [name]: John [name]  Prior Living Arrangements/Services Living arrangements for the past 2 months: [name] Lives with:: Adult Children Patient language and need for interpreter reviewed:: Yes Do you feel safe going back to the place where you live?: Yes      Need for Family Participation in Patient Care: Yes (Comment) Care giver support system in place?: Yes (comment)   Criminal Activity/Legal Involvement Pertinent to Current Situation/Hospitalization: No - Comment as needed  Activities of Daily Living Home Assistive Devices/Equipment: Eyeglasses, Environmental consultant (specify type)(front  wheeled walker) ADL Screening (condition at time of admission) Patient's cognitive ability adequate to safely complete daily activities?: No Is the patient deaf or have difficulty hearing?: No Does the patient have difficulty seeing, even when wearing glasses/contacts?: No Does the patient have difficulty concentrating, remembering, or making decisions?: Yes Patient able to express need for assistance with ADLs?: Yes Does the patient have difficulty dressing or bathing?: Yes Independently performs ADLs?: No Communication: Independent Dressing (OT): Needs assistance Is this a change from baseline?: Change from baseline, expected to last >3 days Grooming: Needs assistance Is this a change from baseline?: Change from baseline, expected to last >3 days Feeding: Needs assistance Is this a change from baseline?: Change from baseline, expected to last >3 days Bathing: Needs assistance Is this a change from baseline?: Change from baseline, expected to last >3 days Toileting: Needs assistance Is this a change from baseline?: Change from baseline, expected to last >3days In/Out Bed: Needs assistance Is this a change from baseline?: Change from baseline, expected to last >3 days Walks in Home: Needs assistance Is this a change from baseline?: Change from baseline, expected to last >3 days Does the patient have difficulty walking or climbing stairs?: Yes(secondary to weakness) Weakness of Legs: Both Weakness of Arms/Hands: None  Permission Sought/Granted                  Emotional Assessment Appearance:: Appears stated age Attitude/Demeanor/Rapport: Engaged Affect (typically observed): Accepting Orientation: : Oriented to Self, Oriented to Place, Oriented to Situation   Psych Involvement:  No (comment)  Admission diagnosis:  Hypokalemia [E87.6] Hypomagnesemia [E83.42] Nausea [R11.0] Prolonged QT interval [R94.31] Acute pain of left knee [M25.562] Acute renal failure, unspecified  acute renal failure type (HCC) [N17.9] ARF (acute renal failure) (John [name]) [N17.9] Patient Active Problem List   Diagnosis Date Noted  . FTT (failure to thrive) in adult   . Iron deficiency anemia   . ARF (acute renal failure) (John [name]) 09/02/2019  . Hypomagnesemia 09/02/2019  . Acute pain of left knee   . Acute metabolic encephalopathy 37/62/8315  . Goals of care, counseling/discussion 08/05/2019  . Acute renal failure (ARF) ([place]) 07/08/2019  . Hypokalemia 07/08/2019  . Vascular dementia without behavioral disturbance (John [name])   . Helicobacter pylori gastritis   . Chronic duodenal ulcer with hemorrhage   . Multiple gastric ulcers   . Acute esophagitis   . Gastrointestinal hemorrhage 04/20/2019  . Vitamin B12 deficiency 04/20/2019  . Folate deficiency 04/20/2019  . Dehydration 04/20/2019  . Hypotension 04/20/2019  . Melena   . Acute blood loss anemia   . CKD (chronic kidney disease), stage III (John [name]) 04/08/2019  . Right lower lobe lung mass 04/08/2019  . Syncope and collapse 04/08/2019  . Symptomatic anemia 04/08/2019  . Occult GI bleeding 04/08/2019  . Hyperglycemia 04/08/2019  . Leukocytosis 04/08/2019  . Syncope 04/08/2019  . Low hemoglobin   . Dyspnea on exertion 08/25/2017  . Bursitis of elbow 03/15/2016  . Obesity (BMI 30-39.9) 12/03/2015  . Primary cancer of right lower lobe of lung (John [name]) 06/14/2015  . Solitary pulmonary nodule 03/13/2015  . RVF (right ventricular failure) (John [name]) 11/04/2014  . COPD II/III with reversibility  12/19/2012  . Chronic respiratory failure with hypoxia (John [name]) 12/18/2012  . Drug-induced hyperglycemia 12/08/2012  . Allergic drug rash 12/06/2012  . Pulmonary HTN (John [name]) 03/18/2012  . Abnormal echocardiogram 03/18/2012  . LV dysfunction 03/18/2012  . COPD exacerbation ([place]) 02/22/2012  . Chronic diastolic CHF (congestive heart failure) ([place]) 02/22/2012  . Diabetes mellitus (John [name]) 02/22/2012  . HTN (hypertension) 02/22/2012  . CAD (coronary artery  disease) 02/22/2012  . Acute on chronic systolic heart failure (John [name]) 02/22/2012   PCP:  [name], NP Pharmacy:   [place], [place] - [address] [place] [address] Phone: [phone] Fax: [phone]     Social Determinants of Health (SDOH) Interventions    Readmission Risk Interventions Readmission Risk Prevention Plan 07/09/2019 04/30/2019 04/30/2019  Transportation Screening Complete - Complete  Medication Review (RN Care Manager) Complete - Complete  PCP or Specialist appointment within 3-5 days of discharge - Complete -  [place] or Home Care Consult Complete - Complete  SW Recovery Care/Counseling Consult Complete - -  Palliative Care Screening Not Applicable - Not [place] Not Applicable - Not Applicable  Some recent data might be hidden

## 2019-09-05 NOTE — Discharge Summary (Addendum)
Physician Discharge Summary  John [name]:[id number] DOB: 02-19-1946 DOA: 09/01/2019  PCP: [name], NP  Admit date: 09/01/2019 Discharge date: 09/05/2019  Time spent: 60 minutes  Recommendations for Outpatient Follow-up:  1. Follow-up with [name], NP in 1 to 2 weeks.  On follow-up patient will need a basic metabolic profile done to follow-up on electrolytes and renal function.  Patient will need a CBC done to follow-up on H&H.  Patient's appetite will need to be reassessed.  Patient's diabetes will also need to be reassessed as patient's oral hypoglycemic agents were discontinued on discharge. 2. Patient be discharged home with home health therapies as well as palliative care to follow in the outpatient setting.   Discharge Diagnoses:  Principal Problem:   ARF (acute renal failure) (HCC) Active Problems:   Diabetes mellitus ([place])   HTN (hypertension)   CAD (coronary artery disease)   Pulmonary HTN (HCC)   COPD II/III with reversibility    Primary cancer of right lower lobe of lung (HCC)   Vascular dementia without behavioral disturbance (HCC)   Hypokalemia   Hypomagnesemia   FTT (failure to thrive) in adult   Iron deficiency anemia   Discharge Condition: Stable and improved  Diet recommendation: Regular  Filed Weights   09/02/19 1533  Weight: 91 kg    History of present illness:  Per Dr Gilmore Laroche is a 73 y.o. male with history of recurrent non-small cell lung cancer received chemotherapy in the first week of August last month, diastolic CHF COPD diabetes mellitus, gout who was recently admitted and discharged 3 weeks ago for encephalopathy and renal failure at that time patient also was found to be orthostatic hypotensive many of patient's antihypertensives were discontinued at that time was receiving IV fluids and discharged home.  Per patient's daughter patient has benign exam poor appetite over the last 1 week and has not  been eating well on questioning patient states he has been having epigastric discomfort with nausea.  Denies any diarrhea chest pain shortness of breath.  ED Course: In the ER acute abdominal series was unremarkable.  Labs revealed acute renal failure with creatinine worsening from 1 and presently is 2.  Albumin 3.3 LFTs were largely unremarkable.  Potassium and magnesium was low.  On exam patient has mild epigastric tenderness for which I have ordered CT abdomen.  Patient is started on IV fluids for acute renal failure and admitted for further management.   Hospital Course:  1 abdominal pain with nausea and vomiting Questionable etiology.  Acute abdominal series unremarkable.  CT abdomen and pelvis unremarkable.  Patient with recent GI bleed with history of duodenal ulcer.  Patient remained asymptomatic throughout the hospitalization.  Patient denied any abdominal pain.  Patient had no overt bleeding.  Hemoglobin decreased since admission however stabilized around 8.3 by day of discharge.  Decreasing hemoglobin felt to be likely dilutional.  Patient maintained on a PPI twice daily as well as Carafate.  Patient hydrated with IV fluids.  Patient had no further nausea vomiting or abdominal pain during the hospitalization.  Outpatient follow-up with PCP.   2.  Iron deficiency anemia Hemoglobin stabilized around 8.3 by day of discharge from 9.4 on admission.  Hemoglobin was 10.1 on 08/11/2019.  Patient with no overt bleeding.  Anemia panel with iron level of 6, TIBC of 203, ferritin of 142, folate of 13.2, vitamin B12 of 581.  Patient with no overt bleeding.  Patient history of duodenal ulcer.  Patient maintained on PPI during the hospitalization.  Patient received a dose of IV Feraheme x1 during the hospitalization.  Patient also maintained on home regimen of oral iron supplementation.  Outpatient follow-up with PCP.   3.  Acute renal failure Likely secondary to prerenal azotemia due to decreased oral  intake.  Creatinine on admission was 2.03.  Creatinine was 1.00 on 08/11/2019.    Patient was hydrated with IV fluids.  Renal function improved and had resolved by day of discharge.  Creatinine was down to 1.02 by day of discharge.  Outpatient follow-up with PCP.   4.  Hypokalemia/hypomagnesemia Repleted during the hospitalization.  Outpatient follow-up.  5.  Well controlled diabetes mellitus type 2 CBG of 83 this morning.  Hemoglobin A1c was 4.6 on 08/10/2019.  Patient's oral hypoglycemic agents were held and discontinued on discharge.  Patient noted to have poor oral intake.  Patient's diet was liberalized.  Patient also started on Megace for appetite stimulant.  Outpatient follow-up with PCP.  6.  Hypertension Patient maintained on home regimen of Norvasc.    7.  Recurrent lung cancer Patient receiving radiation treatment.  Outpatient follow-up with oncology.  Oncology informed of patient's admission.  8.  COPD Patient maintained on Dulera.   9.  Coronary artery disease Remained stable.  Patient maintained on home regimen of Lipitor and Norvasc.    10.  Vascular dementia Patient maintained on home regimen of Seroquel twice daily.  Ativan as needed was added during the hospitalization for agitation.   11.  Failure to thrive Patient with significantly poor oral intake.  Patient eating less than 25% of his meal.  Patient had presented with dehydration and acute renal failure secondary to decreased volume intake.    Patient placed on gentle hydration with IV fluids.  Patient has been seen by palliative care and family recommending full scope of treatment with outpatient follow-up with hospice on discharge.  Patient was started on Megace during the hospitalization which he will be discharged home on for appetite stimulant.  Patient was seen by physical therapy.  Patient was discharged home with home health needs.  Outpatient follow-up with his oncologist and PCP and palliative  care.   Procedures:  CT abdomen and pelvis 09/02/2019  Acute abdominal series 09/02/2019  Consultations:  Palliative care [facility] 09/03/2019  Discharge Exam: Vitals:   09/05/19 0522 09/05/19 0916  BP: (!) 153/74   Pulse: 80   Resp: 18   Temp: 98.7 F (37.1 C)   SpO2: 100% 95%    General: NAD Cardiovascular: RRR Respiratory: CTAB  Discharge Instructions   Discharge Instructions    Diet general   Complete by: As directed    Increase activity slowly   Complete by: As directed      Allergies as of 09/05/2019      Reactions   Levaquin [levofloxacin In D5w] Other (See Comments)   Unknown rxn   Lisinopril Swelling      Medication List    STOP taking these medications   metFORMIN 1000 MG tablet Commonly known as: GLUCOPHAGE   naproxen 500 MG tablet Commonly known as: NAPROSYN     TAKE these medications   acetaminophen 325 MG tablet Commonly known as: TYLENOL Take 2 tablets (650 mg total) by mouth every 6 (six) hours as needed for mild pain (or Fever >/= 101).   albuterol 108 (90 Base) MCG/ACT inhaler Commonly known as: ProAir HFA INHALE 2 PUFFS BY MOUTH EVERY 4 HOURS AS NEEDED  FOR WHEEZING What changed:   how much to take  how to take this  when to take this  reasons to take this  additional instructions   allopurinol 300 MG tablet Commonly known as: ZYLOPRIM Take 300 mg by mouth daily.   amLODipine 10 MG tablet Commonly known as: NORVASC Take 10 mg by mouth daily.   atorvastatin 20 MG tablet Commonly known as: LIPITOR Take 1 tablet (20 mg total) by mouth daily.   cyanocobalamin 1000 MCG tablet Take 1 tablet (1,000 mcg total) by mouth daily.   ferrous sulfate 325 (65 FE) MG tablet Take 1 tablet (325 mg total) by mouth every Monday, Wednesday, and Friday.   folic acid 1 MG tablet Commonly known as: FOLVITE Take 1 tablet (1 mg total) by mouth daily.   megestrol 400 MG/10ML suspension Commonly known as: MEGACE Take  10 mLs (400 mg total) by mouth daily.   multivitamin with minerals Tabs tablet Take 1 tablet by mouth daily. Over the counter   pantoprazole 40 MG tablet Commonly known as: PROTONIX Take 1 tablet (40 mg total) by mouth 2 (two) times daily before a meal.   QUEtiapine 25 MG tablet Commonly known as: SEROQUEL Take 1 tablet (25 mg total) by mouth 2 (two) times daily.   senna-docusate 8.6-50 MG tablet Commonly known as: Senokot-S Take 1 tablet by mouth at bedtime.   sucralfate 1 g tablet Commonly known as: CARAFATE Take 1 g by mouth 4 (four) times daily.   Symbicort 160-4.5 MCG/ACT inhaler Generic drug: budesonide-formoterol Inhale 2 puffs into the lungs 2 (two) times daily.   traMADol 50 MG tablet Commonly known as: ULTRAM Take 1 tablet (50 mg total) by mouth every 6 (six) hours as needed. What changed: reasons to take this   Vitamin D (Ergocalciferol) 1.25 MG (50000 UT) Caps capsule Commonly known as: DRISDOL Take 50,000 Units by mouth every Friday.   Vitamin D3 50 MCG (2000 UT) capsule Take 2,000 Units by mouth daily.      Allergies  Allergen Reactions  . Levaquin [Levofloxacin In D5w] Other (See Comments)    Unknown rxn  . Lisinopril Swelling   Follow-up Information    [name], NP. Schedule an appointment as soon as possible for a visit in 1 week(s).   Specialty: Family Medicine Why: f/u in 1-2 weeks. Contact information: [place]  [id number] [phone]            The results of significant diagnostics from this hospitalization (including imaging, microbiology, ancillary and laboratory) are listed below for reference.    Significant Diagnostic Studies: Ct Abdomen Pelvis Wo Contrast  Result Date: 09/02/2019 CLINICAL DATA:  Abd pain, gastroenteritis or colitis suspected. Right lower lobe lung cancer. EXAM: CT ABDOMEN AND PELVIS WITHOUT CONTRAST TECHNIQUE: Multidetector CT imaging of the abdomen and pelvis was performed  following the standard protocol without IV contrast. COMPARISON:  PET scan 05/26/2019 FINDINGS: Lower chest: Right lower lobe lung mass has slightly decreased in size, now measuring 4.6 x 3.7 cm. No new lesions are present. There is mild dependent atelectasis. Heart size is normal. Coronary artery calcifications are present. Hepatobiliary: The liver is unremarkable. Prominent gallstones are present in the neck of the gallbladder. No inflammatory changes are present. The common bile duct is within normal limits. Pancreas: Vascular coils are present in the pancreatic head, unchanged. Pancreas is otherwise unremarkable. Spleen: Normal in size without focal abnormality. Adrenals/Urinary Tract: Two upper pole right kidney stones are again seen, measuring  up to 7 mm. These are both nonobstructing. A punctate nonobstructing stone is present at the lower pole of the right kidney. Exophytic cyst arising from the right kidney measures 4.5 cm, stable. Left kidney is within normal limits. Ureters are unremarkable. The urinary bladder is within normal limits. Stomach/Bowel: Minimal contrast is present in the stomach. The stomach and duodenum are otherwise unremarkable. Small bowel is within normal limits. Terminal ileum is normal. The ascending and transverse colon are within normal limits. Descending and sigmoid colon are normal. Vascular/Lymphatic: Atherosclerotic calcifications are present in the aorta and branch vessels. There is no aneurysm. Reproductive: Calcifications are present within the prostate gland. Gland is normal in size. Other: No abdominal wall hernia or abnormality. No abdominopelvic ascites. Musculoskeletal: Multilevel degenerative changes are again noted in the lumbar spine. No new focal lytic or blastic lesions are present. Bony pelvis is within normal limits. Degenerative changes are noted again at the ischial tuberosities and both hips. IMPRESSION: 1. No acute or focal abnormality abdomen to explain the  patient's abdominal pain. 2. Nonobstructing right-sided nephrolithiasis. 3.  Aortic Atherosclerosis (ICD10-I70.0). 4. Multilevel degenerative changes of the lumbar spine. 5. Coronary artery disease. 6. Slight decrease in size of known right lower lobe lung mass. Electronically Signed   By: San Morelle M.D.   On: 09/02/2019 05:04   Ct Knee Left Wo Contrast  Result Date: 08/25/2019 CLINICAL DATA:  Chronic knee swelling.  Fall this morning. EXAM: CT OF THE LEFT  KNEE WITHOUT CONTRAST TECHNIQUE: Multidetector CT imaging of the left knee was performed according to the standard protocol. Multiplanar CT image reconstructions were also generated. COMPARISON:  Left knee x-rays from same day. FINDINGS: Bones/Joint/Cartilage No acute fracture or dislocation. Moderate joint effusion. Moderate to severe medial compartment joint space narrowing. Mild lateral and patellofemoral compartment joint space narrowing. Tricompartmental marginal osteophytes. Subchondral sclerosis in the medial compartment. Mild genu valgus deformity. Ligaments Suboptimally assessed by CT. Muscles and Tendons Grossly intact. Soft tissues Vascular calcifications.  No soft tissue mass or fluid collection. IMPRESSION: 1.  No acute osseous abnormality. 2. 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 3. Moderate joint effusion. Electronically Signed   By: Titus Dubin M.D.   On: 08/25/2019 18:23   Dg Chest Portable 1 View  Result Date: 08/09/2019 CLINICAL DATA:  Short of breath. Needle biopsy right lung mass 07/30/2019. EXAM: PORTABLE CHEST 1 VIEW COMPARISON:  CT chest 04/21/2019, chest x-ray 07/12/2019 FINDINGS: Cardiac enlargement without heart failure. Improved aeration in the lung bases bilaterally. No residual infiltrate Right lower lobe mass lesion again identified.  No pneumothorax. IMPRESSION: Right lower lobe mass lesion. Interval clearing of bibasilar airspace disease since the prior chest x-ray.  Electronically Signed   By: Franchot Gallo M.D.   On: 08/09/2019 19:53   Dg Knee Complete 4 Views Left  Result Date: 08/25/2019 CLINICAL DATA:  Status post fall, left knee pain EXAM: LEFT KNEE - COMPLETE 4+ VIEW COMPARISON:  None. FINDINGS: No acute fracture or dislocation. Moderate-severe medial femorotibial compartment joint space narrowing. Mild lateral femorotibial compartment joint space narrowing. Mild patellofemoral compartment joint space narrowing. Tricompartmental marginal osteophytes. Large joint effusion. Peripheral vascular atherosclerotic disease. IMPRESSION: 1. No acute osseous injury of the left knee. 2. Tricompartmental osteoarthritis of the left knee most severe in the medial femorotibial compartment. Electronically Signed   By: Kathreen Devoid   On: 08/25/2019 14:25   Dg Abd Acute 2+v W 1v Chest  Result Date: 09/02/2019 CLINICAL DATA:  Nausea. EXAM: DG  ABDOMEN ACUTE W/ 1V CHEST COMPARISON:  CT chest abdomen pelvis 04/21/2019 FINDINGS: Right lung base opacity corresponds to right lower lobe prior CT. Upper normal heart size. No evidence of acute airspace disease. No pleural fluid. No bowel dilatation to suggest obstruction. Embolization coils in the right upper quadrant. No free air. Right upper quadrant calcifications represent gallstones as well as right renal stones. Multilevel degenerative change in the spine. IMPRESSION: 1. Nonobstructive bowel gas pattern.  No free air. 2. Embolization coils in the right upper quadrant. 3. Right renal stones and gallstones. 4. Right lung base opacities correspond to right lung mass on prior CT. Electronically Signed   By: Keith Rake M.D.   On: 09/02/2019 03:02    Microbiology: Recent Results (from the past 240 hour(s))  Culture, Urine     Status: Abnormal   Collection Time: 09/01/19 11:27 PM   Specimen: Urine, Clean Catch  Result Value Ref Range Status   Specimen Description   Final    URINE, CLEAN CATCH Performed at [facility], [place] [address] [place], [place] [address]    Special Requests   Final    NONE Performed at [facility], [place] [address], [place], [place] [address]    Culture (A)  Final    <10,000 COLONIES/mL INSIGNIFICANT GROWTH Performed at [facility] [address] [place], [place] [address]    Report Status 09/03/2019 FINAL  Final  SARS CORONAVIRUS 2 (TAT 6-24 HRS) Nasopharyngeal Nasopharyngeal Swab     Status: None   Collection Time: 09/02/19 12:41 AM   Specimen: Nasopharyngeal Swab  Result Value Ref Range Status   SARS Coronavirus 2 NEGATIVE NEGATIVE Final    Comment: (NOTE) SARS-CoV-2 target nucleic acids are NOT DETECTED. The SARS-CoV-2 RNA is generally detectable in upper and lower respiratory specimens during the acute phase of infection. Negative results do not preclude SARS-CoV-2 infection, do not rule out co-infections with other pathogens, and should not be used as the sole basis for treatment or other patient management decisions. Negative results must be combined with clinical observations, patient history, and epidemiological information. The expected result is Negative. Fact Sheet for Patients: SugarRoll.be Fact Sheet for Healthcare Providers: https://www.woods-mathews.com/ This test is not yet approved or cleared by the [place] FDA and  has been authorized for detection and/or diagnosis of SARS-CoV-2 by FDA under an Emergency Use Authorization (EUA). This EUA will remain  in effect (meaning this test can be used) for the duration of the COVID-19 declaration under Section 56 4(b)(1) of the Act, 21 U.S.C. section 360bbb-3(b)(1), unless the authorization is terminated or revoked sooner. Performed at [facility], [place] [address] [place], [place] [address]      Labs: Basic Metabolic Panel: Recent Labs  Lab 09/01/19 2250 09/02/19 0515 09/03/19 0416 09/04/19 0433 09/05/19 0346   NA 142 143 143 142 143  K 2.7* 3.4* 3.0* 3.2* 4.0  CL 100 105 105 103 104  CO2 27 27 27 28 25   GLUCOSE 117* 99 82 96 67*  BUN 25* 25* 15 10 7*  CREATININE 2.03* 1.77* 1.26* 1.10 1.02  CALCIUM 9.8 9.0 8.8* 8.7* 8.7*  MG 1.1* 1.5* 1.7 1.8 1.7   Liver Function Tests: Recent Labs  Lab 09/01/19 2250 09/03/19 0416  AST 15 16  ALT 10 9  ALKPHOS 61 49  BILITOT 0.6 0.6  PROT 8.0 6.5  ALBUMIN 3.3* 2.7*   No results for input(s): LIPASE, AMYLASE in the last  168 hours. No results for input(s): AMMONIA in the last 168 hours. CBC: Recent Labs  Lab 09/01/19 2250 09/02/19 0515 09/03/19 0416 09/04/19 0433 09/05/19 0346  WBC 10.3 8.3 6.6 7.4  --   NEUTROABS 8.5*  --  5.3  --   --   HGB 9.4* 8.4* 8.4* 8.7* 8.3*  HCT 29.7* 27.2* 26.8* 27.8* 27.5*  MCV 98.0 98.2 98.9 97.9  --   PLT 345 327 319 341  --    Cardiac Enzymes: No results for input(s): CKTOTAL, CKMB, CKMBINDEX, TROPONINI in the last 168 hours. BNP: BNP (last 3 results) Recent Labs    04/08/19 0310 04/28/19 1529 09/01/19 2250  BNP 61.0 49.5 42.3    ProBNP (last 3 results) No results for input(s): PROBNP in the last 8760 hours.  CBG: Recent Labs  Lab 09/04/19 1152 09/04/19 1622 09/04/19 2051 09/05/19 0839 09/05/19 0922  GLUCAP 99 97 96 67* 88       Signed:  Irine Seal MD.  [facility] 09/05/2019, 10:56 AM

## 2019-09-05 NOTE — Progress Notes (Signed)
    Home health agencies that serve [phone].        Home Health Agencies Search Results  Results List Table  Home Health Agency Information Quality of Patient Care Rating Patient Survey Summary Rating  [facility] [phone] 4 out of 5 stars 4 out of 5 stars  [facility] [phone] 3 out of 5 stars 4 out of 5 stars  [facility] [phone] 4  out of 5 stars 3 out of 5 stars  [facility] [phone] 4  out of 5 stars 4 out of 5 stars  [facility] [phone] 4 out of 5 stars 4 out of 5 stars  [facility] [phone] 4 out of 5 stars 4 out of 5 stars  [facility]  [phone] 3  out of 5 stars 4 out of 5 stars  [facility] [phone] 2  out of 5 stars 3 out of 5 stars  [facility] [phone] 3 out of 5 stars 4 out of 5 stars  [facility] [phone] 4 out of 5 stars Not Available12  [facility] [phone] 3 out of 5 stars 4 out of 5 stars  [facility] [phone] Not Available5 Not Available12  [facility] [phone] 3  out of 5 stars 3 out of 5 stars  [facility] [phone] 5 out of 5 stars 4 out of 5 stars  [facility] [phone] 3  out of 5 stars 4 out of 5 stars  [facility] [phone] 3  out of 5 stars 3 out of 5 stars  [facility] [phone] 3  out of 5 stars Not Available11  [facility] [phone] 4  out of 5 stars 3 out of 5 stars   Home Health Footnotes  Footnote number Footnote as displayed on Home Health Compare  1 This agency provides services under a federal waiver program to non-traditional, chronic long term population.  2 This agency provides services to a special needs population.  3 Not Available.  4 The number of patient episodes  for this measure is too small to report.  5 This measure currently does not have data or provider has been certified/recertified for less than 6 months.  6 The national average for this measure is not provided because of state-to-state differences in data collection.  7 Medicare is not displaying rates for this measure for any home health agency, because of an issue with the data.  8 There were problems with the data and they are being corrected.  9 Zero, or very few, patients met the survey's rules for inclusion. The scores shown, if any, reflect a very small number of surveys and may not accurately tell how an agency is doing.  10 Survey results are based on less than 12 months of data.  11 Fewer than 70 patients completed the survey. Use the scores shown, if any, with caution as the number of surveys may be too low to accurately tell how an agency is doing.  12 No survey results are available for this period.  13 Data suppressed by CMS for one or more quarters.    

## 2019-09-05 NOTE — Progress Notes (Signed)
Occupational Therapy Evaluation Patient Details Name: John [name] MRN: [id number] DOB: 1946-03-01 Today's Date: 09/05/2019    History of Present Illness DEAIRE [name] is a 73 y.o. male with history of recurrent non-small cell lung cancer received chemotherapy in the first week of August last month, diastolic CHF COPD diabetes mellitus, gout admitted with acute renal failure   Clinical Impression   Pt received with above diagnosis. PTA pt PLOF living alone requiring assistance with ADLs and AE available as needed. Pt currently requires Max A LB ADLs, Min A +2 functional transfers due to limited balance and pain. Pt will benefit from continued rehab to address safety and to maximize independence prior to dc setting. DC recommendation for SNF to ensure safety with transition to home setting, however, daughter reports assistance in home environment.     Follow Up Recommendations  SNF;Supervision/Assistance - 24 hour;Home health OT    Equipment Recommendations  3 in 1 bedside commode    Recommendations for Other Services       Precautions / Restrictions Precautions Precautions: Fall Restrictions Weight Bearing Restrictions: No      Mobility Bed Mobility Overal bed mobility: Needs Assistance Bed Mobility: Supine to Sit     Supine to sit: Min assist     General bed mobility comments: Pt requires assist to elevate trunk to sitting at EOB. No physical assistance needed for BLE.  Transfers Overall transfer level: Needs assistance Equipment used: Rolling walker (2 wheeled) Transfers: Stand Pivot Transfers Sit to Stand: Min assist Stand pivot transfers: Min assist;+2 physical assistance;+2 safety/equipment       General transfer comment: pt is fearful of falling and reaches for unsteady objects.    Balance Overall balance assessment: Needs assistance Sitting-balance support: Bilateral upper extremity supported Sitting balance-Leahy Scale: Poor     Standing balance  support: Single extremity supported Standing balance-Leahy Scale: Poor                             ADL either performed or assessed with clinical judgement   ADL Overall ADL's : Needs assistance/impaired Eating/Feeding: Independent   Grooming: Oral care;Standing;Set up   Upper Body Bathing: Min guard   Lower Body Bathing: Moderate assistance;Sitting/lateral leans   Upper Body Dressing : Set up   Lower Body Dressing: Moderate assistance;Sitting/lateral leans   Toilet Transfer: Minimal assistance;+2 for safety/equipment;Stand-pivot Toilet Transfer Details (indicate cue type and reason): Toilet transfer from bed to [facility] with 2 hand held assist. Max VCs required for safety and hand placement due to patient reaching for unsteady objects to stand. 1st attempt to stand with RW. Pt demonstrate incorrect use of walker limiting functional transfers.          Functional mobility during ADLs: +2 for physical assistance;+2 for safety/equipment;Minimal assistance;Cueing for safety;Cueing for sequencing General ADL Comments: Pt demonstrate unsafe use of RW requires Max VCs for hand placement and sequencing to perform toilet transfer. 2 hand held assist required. Min A for physical assistance Min A +2 for safety.      Vision         Perception     Praxis      Pertinent Vitals/Pain Pain Assessment: Faces Faces Pain Scale: Hurts little more Pain Location: knees Pain Descriptors / Indicators: Grimacing Pain Intervention(s): Limited activity within patient's tolerance;Repositioned;Monitored during session     Hand Dominance Right   Extremity/Trunk Assessment Upper Extremity Assessment Upper Extremity Assessment: Generalized weakness   Lower Extremity  Assessment Lower Extremity Assessment: Defer to PT evaluation   Cervical / Trunk Assessment Cervical / Trunk Assessment: Normal   Communication Communication Communication: No difficulties   Cognition Arousal/Alertness:  Awake/alert Behavior During Therapy: WFL for tasks assessed/performed Overall Cognitive Status: Impaired/Different from baseline Area of Impairment: Memory;Orientation;Awareness                 Orientation Level: Disoriented to;Situation;Place   Memory: Decreased short-term memory     Awareness: Emergent   General Comments: h/o vascular dementia; memory NT   General Comments       Exercises     Shoulder Instructions      Home Living Family/patient expects to be discharged to:: Private residence Living Arrangements: Children Available Help at Discharge: Family;Available PRN/intermittently Type of Home: House Home Access: Stairs to enter [employer] of Steps: 1 Entrance Stairs-Rails: None Home Layout: One level     Bathroom Shower/Tub: Occupational psychologist: Standard     Home Equipment: Environmental consultant - 2 wheels;Cane - single point   Additional Comments: pt plans to dc to daughters with help from granddaughter as well.      Prior Functioning/Environment Level of Independence: Independent with assistive device(s)        Comments: has both walker and cane but doesn't use them mostly or correctly as observed from evaluation.        OT Problem List: Impaired balance (sitting and/or standing);Decreased safety awareness;Decreased knowledge of use of DME or AE;Pain      OT Treatment/Interventions: Self-care/ADL training;DME and/or AE instruction;Patient/family education;Balance training;Therapeutic activities;Cognitive remediation/compensation    OT Goals(Current goals can be found in the care plan section) Acute Rehab OT Goals Patient Stated Goal: home OT Goal Formulation: With patient Time For Goal Achievement: 08/24/19 Potential to Achieve Goals: Good  OT Frequency: Min 2X/week   Barriers to D/C: Decreased caregiver support  Pt reports granddaughter will be available to assist during the day but also reports shes not knowledgable of how  to assist him.       Co-evaluation              AM-PAC OT "6 Clicks" Daily Activity     Outcome Measure Help from another person eating meals?: None Help from another person taking care of personal grooming?: A Little Help from another person toileting, which includes using toliet, bedpan, or urinal?: A Little Help from another person bathing (including washing, rinsing, drying)?: A Little Help from another person to put on and taking off regular upper body clothing?: A Little Help from another person to put on and taking off regular lower body clothing?: A Little 6 Click Score: 19   End of Session Equipment Utilized During Treatment: Gait belt;Rolling walker Nurse Communication: Mobility status  Activity Tolerance: Patient tolerated treatment well Patient left: in bed;with call bell/phone within reach;with bed alarm set  OT Visit Diagnosis: Muscle weakness (generalized) (M62.81);Pain Pain - Right/Left: Left Pain - part of body: Knee                Time: 4580-9983 OT Time Calculation (min): 30 min Charges:  OT General Charges $OT Visit: 1 Visit OT Evaluation $OT Eval Low Complexity: 1 Low OT Treatments $Self Care/Home Management : 8-22 mins  Minus Breeding, MSOT, OTR/L  Supplemental Rehabilitation Services  [phone]   Marius Ditch 09/05/2019, 12:12 PM

## 2019-09-08 ENCOUNTER — Ambulatory Visit: Payer: Medicare Other

## 2019-09-08 ENCOUNTER — Telehealth: Payer: Self-pay | Admitting: Hospice

## 2019-09-08 ENCOUNTER — Telehealth: Payer: Medicare Other | Admitting: Neurology

## 2019-09-08 NOTE — Telephone Encounter (Signed)
Called daughter Willette Cluster, to schedule Palliative Consult, no answer.  Left message with reason for call along with my contact information.

## 2019-09-16 ENCOUNTER — Telehealth: Payer: Self-pay | Admitting: Hospice

## 2019-09-16 NOTE — Telephone Encounter (Signed)
Spoke with patient's daughter Khris Jansson and she was in agreement with Palliative services.  I have scheduled a Telephone Consult for 10/04/19 @ 3:30 PM.

## 2019-09-17 ENCOUNTER — Emergency Department (HOSPITAL_COMMUNITY)
Admission: EM | Admit: 2019-09-17 | Discharge: 2019-09-17 | Disposition: A | Discharge status: Home / Self Care | Payer: Medicare Other | Source: Home / Self Care | Attending: Emergency Medicine | Admitting: Emergency Medicine

## 2019-09-17 ENCOUNTER — Emergency Department (HOSPITAL_COMMUNITY): Payer: Medicare Other

## 2019-09-17 ENCOUNTER — Other Ambulatory Visit: Payer: Self-pay

## 2019-09-17 ENCOUNTER — Encounter (HOSPITAL_COMMUNITY): Payer: Self-pay | Admitting: Emergency Medicine

## 2019-09-17 DIAGNOSIS — J449 Chronic obstructive pulmonary disease, unspecified: Secondary | ICD-10-CM | POA: Insufficient documentation

## 2019-09-17 DIAGNOSIS — Z85118 Personal history of other malignant neoplasm of bronchus and lung: Secondary | ICD-10-CM | POA: Insufficient documentation

## 2019-09-17 DIAGNOSIS — M1712 Unilateral primary osteoarthritis, left knee: Secondary | ICD-10-CM

## 2019-09-17 DIAGNOSIS — I951 Orthostatic hypotension: Secondary | ICD-10-CM | POA: Diagnosis not present

## 2019-09-17 DIAGNOSIS — I2510 Atherosclerotic heart disease of native coronary artery without angina pectoris: Secondary | ICD-10-CM | POA: Insufficient documentation

## 2019-09-17 DIAGNOSIS — Y999 Unspecified external cause status: Secondary | ICD-10-CM | POA: Insufficient documentation

## 2019-09-17 DIAGNOSIS — N183 Chronic kidney disease, stage 3 (moderate): Secondary | ICD-10-CM | POA: Insufficient documentation

## 2019-09-17 DIAGNOSIS — I130 Hypertensive heart and chronic kidney disease with heart failure and stage 1 through stage 4 chronic kidney disease, or unspecified chronic kidney disease: Secondary | ICD-10-CM | POA: Insufficient documentation

## 2019-09-17 DIAGNOSIS — E876 Hypokalemia: Secondary | ICD-10-CM

## 2019-09-17 DIAGNOSIS — W19XXXA Unspecified fall, initial encounter: Secondary | ICD-10-CM

## 2019-09-17 DIAGNOSIS — E860 Dehydration: Secondary | ICD-10-CM | POA: Insufficient documentation

## 2019-09-17 DIAGNOSIS — R410 Disorientation, unspecified: Secondary | ICD-10-CM | POA: Insufficient documentation

## 2019-09-17 DIAGNOSIS — Y9289 Other specified places as the place of occurrence of the external cause: Secondary | ICD-10-CM | POA: Insufficient documentation

## 2019-09-17 DIAGNOSIS — R55 Syncope and collapse: Secondary | ICD-10-CM | POA: Diagnosis not present

## 2019-09-17 DIAGNOSIS — I501 Left ventricular failure: Secondary | ICD-10-CM | POA: Insufficient documentation

## 2019-09-17 DIAGNOSIS — E1122 Type 2 diabetes mellitus with diabetic chronic kidney disease: Secondary | ICD-10-CM | POA: Insufficient documentation

## 2019-09-17 DIAGNOSIS — Z87891 Personal history of nicotine dependence: Secondary | ICD-10-CM | POA: Insufficient documentation

## 2019-09-17 DIAGNOSIS — Z79899 Other long term (current) drug therapy: Secondary | ICD-10-CM | POA: Insufficient documentation

## 2019-09-17 DIAGNOSIS — F0150 Vascular dementia without behavioral disturbance: Secondary | ICD-10-CM | POA: Insufficient documentation

## 2019-09-17 DIAGNOSIS — Y9389 Activity, other specified: Secondary | ICD-10-CM | POA: Insufficient documentation

## 2019-09-17 LAB — CBC WITH DIFFERENTIAL/PLATELET
Abs Immature Granulocytes: 0.05 10*3/uL (ref 0.00–0.07)
Basophils Absolute: 0 10*3/uL (ref 0.0–0.1)
Basophils Relative: 0 %
Eosinophils Absolute: 0 10*3/uL (ref 0.0–0.5)
Eosinophils Relative: 0 %
HCT: 30.8 % — ABNORMAL LOW (ref 39.0–52.0)
Hemoglobin: 9.9 g/dL — ABNORMAL LOW (ref 13.0–17.0)
Immature Granulocytes: 1 %
Lymphocytes Relative: 5 %
Lymphs Abs: 0.4 10*3/uL — ABNORMAL LOW (ref 0.7–4.0)
MCH: 30.9 pg (ref 26.0–34.0)
MCHC: 32.1 g/dL (ref 30.0–36.0)
MCV: 96.3 fL (ref 80.0–100.0)
Monocytes Absolute: 0.6 10*3/uL (ref 0.1–1.0)
Monocytes Relative: 7 %
Neutro Abs: 8.3 10*3/uL — ABNORMAL HIGH (ref 1.7–7.7)
Neutrophils Relative %: 87 %
Platelets: 383 10*3/uL (ref 150–400)
RBC: 3.2 MIL/uL — ABNORMAL LOW (ref 4.22–5.81)
RDW: 16 % — ABNORMAL HIGH (ref 11.5–15.5)
WBC: 9.5 10*3/uL (ref 4.0–10.5)
nRBC: 0 % (ref 0.0–0.2)

## 2019-09-17 LAB — BASIC METABOLIC PANEL
Anion gap: 11 (ref 5–15)
BUN: 15 mg/dL (ref 8–23)
CO2: 23 mmol/L (ref 22–32)
Calcium: 9.3 mg/dL (ref 8.9–10.3)
Chloride: 107 mmol/L (ref 98–111)
Creatinine, Ser: 1.43 mg/dL — ABNORMAL HIGH (ref 0.61–1.24)
GFR calc Af Amer: 56 mL/min — ABNORMAL LOW (ref >60)
GFR calc non Af Amer: 48 mL/min — ABNORMAL LOW (ref >60)
Glucose, Bld: 123 mg/dL — ABNORMAL HIGH (ref 70–99)
Potassium: 3 mmol/L — ABNORMAL LOW (ref 3.5–5.1)
Sodium: 141 mmol/L (ref 135–145)

## 2019-09-17 MED ORDER — POTASSIUM CHLORIDE CRYS ER 20 MEQ PO TBCR
40.0000 meq | EXTENDED_RELEASE_TABLET | Freq: Once | ORAL | Status: AC
  Administered 2019-09-17: 18:00:00 40 meq via ORAL
  Filled 2019-09-17: qty 2

## 2019-09-17 MED ORDER — MAGNESIUM OXIDE -MG SUPPLEMENT 200 MG PO TABS: 1.0000 | ORAL_TABLET | Freq: Two times a day (BID) | ORAL | 0 refills | Status: AC

## 2019-09-17 MED ORDER — POTASSIUM CHLORIDE CRYS ER 20 MEQ PO TBCR: 20.0000 meq | EXTENDED_RELEASE_TABLET | Freq: Two times a day (BID) | ORAL | 0 refills | Status: DC

## 2019-09-17 MED ORDER — TRAMADOL HCL 50 MG PO TABS: 50.0000 mg | ORAL_TABLET | Freq: Four times a day (QID) | ORAL | 0 refills | Status: DC | PRN

## 2019-09-17 NOTE — ED Triage Notes (Signed)
Pt came by EMS from home. Pt fell when attempting to get in car. Patient c/o bilateral knee pain.   Pt didn't hit his head.   Pt denies being on blood thinners.

## 2019-09-17 NOTE — Discharge Instructions (Signed)
Testing today indicates that your left knee is sore and swollen because of the arthritis, there was no fracture, when he fell today.  Your potassium level has dropped again, and we have sent a prescription for that to your pharmacy.  Try to eat foods which contain more potassium, listed on this document.  Your creatinine level is slightly elevated today likely because of mild dehydration.  This should improve if you drink 1 L of water each day.  For the pain in your left knee, use heat 3 or 4 times a day and wear your knee brace.  Be careful when you stand up so that you do not fall.

## 2019-09-17 NOTE — ED Provider Notes (Signed)
[facility] DEPT Provider Note   CSN: [id number] Arrival date & time: 09/17/19  1416     History   Chief Complaint Chief Complaint  Patient presents with  . Fall    HPI John [name] is a 73 y.o. male.     HPI  He presents for evaluation of an injury to his left knee.  He describes getting out of 1 ambulance and falling hitting his left knee then getting into another ambulance.  He is confused.  He is unable to give any additional history.  Level 5 caveat-confusion  Past Medical History:  Diagnosis Date  . B12 deficiency   . CHF (congestive heart failure) ([place])   . COPD (chronic obstructive pulmonary disease) ([place])   . Diabetes mellitus without complication ([place])   . Folate deficiency   . Gout   . Hyperlipemia   . Hypertension   . 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 MI (myocardial infarction) ([place])   . Pulmonary hypertension [facility])     Patient Active Problem List   Diagnosis Date Noted  . FTT (failure to thrive) in adult   . Iron deficiency anemia   . ARF (acute renal failure) ([place]) 09/02/2019  . Hypomagnesemia 09/02/2019  . Acute pain of left knee   . Acute metabolic encephalopathy 41/58/3094  . Goals of care, counseling/discussion 08/05/2019  . Acute renal failure (ARF) ([place]) 07/08/2019  . Hypokalemia 07/08/2019  . Vascular dementia without behavioral disturbance ([place])   . Helicobacter pylori gastritis   . Chronic duodenal ulcer with hemorrhage   . Multiple gastric ulcers   . Acute esophagitis   . Gastrointestinal hemorrhage 04/20/2019  . Vitamin B12 deficiency 04/20/2019  . Folate deficiency 04/20/2019  . Dehydration 04/20/2019  . Hypotension 04/20/2019  . Melena   . Acute blood loss anemia   . CKD (chronic kidney disease), stage III ([place]) 04/08/2019  . Right lower lobe lung mass 04/08/2019  . Syncope and collapse 04/08/2019  . Symptomatic anemia 04/08/2019  . Occult GI bleeding  04/08/2019  . Hyperglycemia 04/08/2019  . Leukocytosis 04/08/2019  . Syncope 04/08/2019  . Low hemoglobin   . Dyspnea on exertion 08/25/2017  . Bursitis of elbow 03/15/2016  . Obesity (BMI 30-39.9) 12/03/2015  . Primary cancer of right lower lobe of lung ([place]) 06/14/2015  . Solitary pulmonary nodule 03/13/2015  . RVF (right ventricular failure) ([place]) 11/04/2014  . COPD II/III with reversibility  12/19/2012  . Chronic respiratory failure with hypoxia ([place]) 12/18/2012  . Drug-induced hyperglycemia 12/08/2012  . Allergic drug rash 12/06/2012  . Pulmonary HTN ([place]) 03/18/2012  . Abnormal echocardiogram 03/18/2012  . LV dysfunction 03/18/2012  . COPD exacerbation ([place]) 02/22/2012  . Chronic diastolic CHF (congestive heart failure) ([place]) 02/22/2012  . Diabetes mellitus ([place]) 02/22/2012  . HTN (hypertension) 02/22/2012  . CAD (coronary artery disease) 02/22/2012  . Acute on chronic systolic heart failure ([place]) 02/22/2012    Past Surgical History:  Procedure Laterality Date  . APPENDECTOMY    . BIOPSY  04/21/2019   Procedure: BIOPSY;  Surgeon: Gatha Mayer, MD;  Location: [facility];  Service: Endoscopy;;  . ESOPHAGOGASTRODUODENOSCOPY (EGD) WITH PROPOFOL N/A 04/21/2019   Procedure: ESOPHAGOGASTRODUODENOSCOPY (EGD) WITH PROPOFOL;  Surgeon: Gatha Mayer, MD;  Location: [facility];  Service: Endoscopy;  Laterality: N/A;  . ESOPHAGOGASTRODUODENOSCOPY (EGD) WITH PROPOFOL N/A 04/29/2019   Procedure: ESOPHAGOGASTRODUODENOSCOPY (EGD) WITH PROPOFOL;  Surgeon: Mauri Pole, MD;  Location: [facility] ENDOSCOPY;  Service: Endoscopy;  Laterality: N/A;  . HEMOSTASIS CLIP PLACEMENT  04/29/2019   Procedure: HEMOSTASIS CLIP PLACEMENT;  Surgeon: Mauri Pole, MD;  Location: [facility] ENDOSCOPY;  Service: Endoscopy;;  Clip placed as marker not for bleeding control  . IR ANGIOGRAM SELECTIVE EACH ADDITIONAL VESSEL  04/29/2019  . IR ANGIOGRAM SELECTIVE EACH ADDITIONAL VESSEL  04/29/2019  . IR ANGIOGRAM  SELECTIVE EACH ADDITIONAL VESSEL  04/29/2019  . IR ANGIOGRAM VISCERAL SELECTIVE  04/29/2019  . IR EMBO ART  VEN HEMORR LYMPH EXTRAV  INC GUIDE ROADMAPPING  04/29/2019  . IR US GUIDE VASC ACCESS RIGHT  04/29/2019  . LUNG BIOPSY          Home Medications    Prior to Admission medications   Medication Sig Start Date End Date Taking? Authorizing Provider  acetaminophen (TYLENOL) 325 MG tablet Take 2 tablets (650 mg total) by mouth every 6 (six) hours as needed for mild pain (or Fever >/= 101). 09/05/19  Yes Eugenie Filler, MD  albuterol (PROAIR HFA) 108 (90 BASE) MCG/ACT inhaler INHALE 2 PUFFS BY MOUTH EVERY 4 HOURS AS NEEDED FOR WHEEZING Patient taking differently: Inhale 2 puffs into the lungs every 4 (four) hours as needed for wheezing.  03/13/15  Yes Tanda Rockers, MD  allopurinol (ZYLOPRIM) 300 MG tablet Take 300 mg by mouth daily. 04/07/19  Yes [provider]  amLODipine (NORVASC) 10 MG tablet Take 10 mg by mouth daily. 05/07/19  Yes [provider]  atorvastatin (LIPITOR) 20 MG tablet Take 1 tablet (20 mg total) by mouth daily. 09/26/16  Yes Tanda Rockers, MD  Cholecalciferol (VITAMIN D3) 50 MCG (2000 UT) capsule Take 2,000 Units by mouth daily.  05/07/19  Yes [provider]  ferrous sulfate 325 (65 FE) MG tablet Take 1 tablet (325 mg total) by mouth every Monday, Wednesday, and Friday. 08/11/19 03/08/20 Yes Rai, Ripudeep K, MD  folic acid (FOLVITE) 1 MG tablet Take 1 tablet (1 mg total) by mouth daily. 04/09/19 04/08/20 Yes Donne Hazel, MD  megestrol (MEGACE) 400 MG/10ML suspension Take 10 mLs (400 mg total) by mouth daily. 09/05/19  Yes Eugenie Filler, MD  Multiple Vitamin (MULTIVITAMIN WITH MINERALS) TABS tablet Take 1 tablet by mouth daily. Over the counter 08/12/19  Yes Rai, Ripudeep K, MD  pantoprazole (PROTONIX) 40 MG tablet Take 1 tablet (40 mg total) by mouth 2 (two) times daily before a meal. 05/28/19  Yes Nandigam, Venia Minks, MD  QUEtiapine (SEROQUEL) 25  MG tablet Take 1 tablet (25 mg total) by mouth 2 (two) times daily. 08/11/19  Yes Rai, Ripudeep K, MD  senna-docusate (SENOKOT-S) 8.6-50 MG tablet Take 1 tablet by mouth at bedtime. 05/01/19  Yes Florencia Reasons, MD  sucralfate (CARAFATE) 1 g tablet Take 1 g by mouth 4 (four) times daily.  06/23/19  Yes [provider]  SYMBICORT 160-4.5 MCG/ACT inhaler Inhale 2 puffs into the lungs 2 (two) times daily. 07/20/19  Yes [provider]  vitamin B-12 1000 MCG tablet Take 1 tablet (1,000 mcg total) by mouth daily. 08/12/19  Yes Rai, Ripudeep K, MD  Vitamin D, Ergocalciferol, (DRISDOL) 1.25 MG (50000 UT) CAPS capsule Take 50,000 Units by mouth every Friday.  03/10/19  Yes [provider]  Magnesium Oxide 200 MG TABS Take 1 tablet (200 mg total) by mouth 2 (two) times daily. 09/17/19   Daleen Bo, MD  potassium chloride SA (K-DUR) 20 MEQ tablet Take 1 tablet (20 mEq total)  by mouth 2 (two) times daily. 09/17/19   Daleen Bo, MD  traMADol (ULTRAM) 50 MG tablet Take 1 tablet (50 mg total) by mouth every 6 (six) hours as needed for moderate pain. 09/17/19   Daleen Bo, MD    Family History Family History  Problem Relation Age of Onset  . Heart disease Mother   . Cancer Neg Hx     Social History Social History   Tobacco Use  . Smoking status: Former Smoker    Packs/day: 1.50    Years: 60.00    Pack years: 90.00    Types: Cigarettes    Quit date: 09/29/2012    Years since quitting: 6.9  . Smokeless tobacco: Never Used  Substance Use Topics  . Alcohol use: No    Alcohol/week: 0.0 standard drinks  . Drug use: No     Allergies   Levaquin [levofloxacin in d5w] and Lisinopril   Review of Systems Review of Systems  Unable to perform ROS: Mental status change     Physical Exam Updated Vital Signs BP 118/69 (BP Location: Left Arm)   Pulse 88   Temp 97.8 F (36.6 C) (Oral)   Resp 13   Ht 5\' 8"  (1.727 m)   Wt 86.2 kg   SpO2 100%   BMI 28.89 kg/m   Physical  Exam Vitals signs and nursing note reviewed.  Constitutional:      General: He is not in acute distress.    Appearance: He is well-developed. He is not ill-appearing, toxic-appearing or diaphoretic.  HENT:     Head: Normocephalic and atraumatic.     Right Ear: External ear normal.     Left Ear: External ear normal.  Eyes:     Conjunctiva/sclera: Conjunctivae normal.     Pupils: Pupils are equal, round, and reactive to light.  Neck:     Musculoskeletal: Normal range of motion and neck supple.     Trachea: Phonation normal.  Cardiovascular:     Rate and Rhythm: Normal rate.  Pulmonary:     Effort: Pulmonary effort is normal.  Musculoskeletal:     Comments: Left knee is tender and swollen.  He gets movement of the left knee secondary to pain.  There is no visible deformity of the left knee.  No apparent abnormality of the right leg, or left hip.  Skin:    General: Skin is warm and dry.  Neurological:     Mental Status: He is alert and oriented to person, place, and time.     Cranial Nerves: No cranial nerve deficit.     Sensory: No sensory deficit.     Motor: No abnormal muscle tone.     Coordination: Coordination normal.  Psychiatric:        Mood and Affect: Mood normal.        Behavior: Behavior normal.      ED Treatments / Results  Labs (all labs ordered are listed, but only abnormal results are displayed) Labs Reviewed  BASIC METABOLIC PANEL - Abnormal; Notable for the following components:      Result Value   Potassium 3.0 (*)    Glucose, Bld 123 (*)    Creatinine, Ser 1.43 (*)    GFR calc non Af Amer 48 (*)    GFR calc Af Amer 56 (*)    All other components within normal limits  CBC WITH DIFFERENTIAL/PLATELET - Abnormal; Notable for the following components:   RBC 3.20 (*)    Hemoglobin 9.9 (*)  HCT 30.8 (*)    RDW 16.0 (*)    Neutro Abs 8.3 (*)    Lymphs Abs 0.4 (*)    All other components within normal limits    EKG None  Radiology Dg Knee Complete  4 Views Left  Result Date: 09/17/2019 CLINICAL DATA:  Fall, pain EXAM: LEFT KNEE - COMPLETE 4+ VIEW COMPARISON:  08/25/2019 FINDINGS: No fracture or dislocation of the left knee. Severe tricompartmental arthrosis, with near total medial compartment joint space loss. No knee joint effusion. Vascular calcinosis. IMPRESSION: No fracture or dislocation of the left knee. Severe tricompartmental arthrosis, with near total medial compartment joint space loss. Electronically Signed   By: Eddie Candle M.D.   On: 09/17/2019 15:55    Procedures Procedures (including critical care time)  Medications Ordered in ED Medications  potassium chloride SA (K-DUR) CR tablet 40 mEq (has no administration in time range)     Initial Impression / Assessment and Plan / ED Course  I have reviewed the triage vital signs and the nursing notes.  Pertinent labs & imaging results that were available during my care of the patient were reviewed by me and considered in my medical decision making (see chart for details).  Clinical Course as of Sep 16 1749  Fri Sep 17, 2019  1625 Severe arthritis, unchanged from prior.  No apparent fracture.  Images reviewed by me  DG Knee Complete 4 Views Left [EW]  4270 I was able to reach his daughter Willette Cluster by phone.  She states that the patient fell, because he was "off balance," as he was maneuvering around a car, at home.  She will come and get him after his blood work returns.   [EW]  1746 Normal except potassium low, glucose high, creatinine high, GFR low  Basic metabolic panel(!) [EW]  6237 Normal except hemoglobin low  CBC with Differential(!) [EW]  1747 I discussed the case findings and recommendations with his daughter Willette Cluster, who will come and get in at this time.  She requested a refill on his tramadol because he missed his doctor's appointment today, because he had to come to the hospital.   [EW]    Clinical Course User Index [EW] Daleen Bo, MD         Patient Vitals for the past 24 hrs:  BP Temp Temp src Pulse Resp SpO2 Height Weight  09/17/19 1630 118/69 - - 88 13 100 % - -  09/17/19 1433 - - - - - - 5\' 8"  (1.727 m) 86.2 kg  09/17/19 1432 124/76 97.8 F (36.6 C) Oral 98 16 99 % - -    5:50 PM Reevaluation with update and discussion. After initial assessment and treatment, an updated evaluation reveals no change in clinical status, findings discussed with patient and daughter, all questions answered. Daleen Bo   Medical Decision Making: Fall without serious injury.  Recurrent hypokalemia suspected low magnesium level.  Also apparent mild bump in creatinine indicating dehydration.  Patient is clinically well, and does not require hospitalization at this time.  He has chronic no left knee pain and swelling, and commonly does not wear his knee brace as directed.  He has access to follow-up care with his PCP.  CRITICAL CARE-no Performed by: Daleen Bo   Patient Vitals for the past 24 hrs:  BP Temp Temp src Pulse Resp SpO2 Height Weight  09/17/19 1630 118/69 - - 88 13 100 % - -  09/17/19 1433 - - - - - - 5'  8" (1.727 m) 86.2 kg  09/17/19 1432 124/76 97.8 F (36.6 C) Oral 98 16 99 % - -   Nursing Notes Reviewed/ Care Coordinated Applicable Imaging Reviewed Interpretation of Laboratory Data incorporated into ED treatment  The patient appears reasonably screened and/or stabilized for discharge and I doubt any other medical condition or other [facility] requiring further screening, evaluation, or treatment in the ED at this time prior to discharge.  Plan: Home Medications-continue usual; Home Treatments-heat to affected area of knee; return here if the recommended treatment, does not improve the symptoms; Recommended follow up-PCP 1 week and as needed    Final Clinical Impressions(s) / ED Diagnoses   Final diagnoses:  Fall, initial encounter  Arthritis of left knee  Hypokalemia  Dehydration    ED Discharge Orders          Ordered    potassium chloride SA (K-DUR) 20 MEQ tablet  2 times daily     09/17/19 1746    Magnesium Oxide 200 MG TABS  2 times daily     09/17/19 1746    traMADol (ULTRAM) 50 MG tablet  Every 6 hours PRN     09/17/19 1746           Daleen Bo, MD 09/17/19 1752

## 2019-09-19 ENCOUNTER — Other Ambulatory Visit: Payer: Self-pay

## 2019-09-19 ENCOUNTER — Inpatient Hospital Stay (HOSPITAL_COMMUNITY)
Admission: EM | Admit: 2019-09-19 | Discharge: 2019-09-24 | DRG: 312 | Disposition: A | Discharge status: Home Health | Payer: Medicare Other | Attending: Family Medicine | Admitting: Family Medicine

## 2019-09-19 ENCOUNTER — Encounter (HOSPITAL_COMMUNITY): Payer: Self-pay

## 2019-09-19 DIAGNOSIS — W19XXXA Unspecified fall, initial encounter: Secondary | ICD-10-CM | POA: Diagnosis present

## 2019-09-19 DIAGNOSIS — Z9221 Personal history of antineoplastic chemotherapy: Secondary | ICD-10-CM

## 2019-09-19 DIAGNOSIS — E785 Hyperlipidemia, unspecified: Secondary | ICD-10-CM | POA: Diagnosis present

## 2019-09-19 DIAGNOSIS — R55 Syncope and collapse: Secondary | ICD-10-CM | POA: Diagnosis present

## 2019-09-19 DIAGNOSIS — I130 Hypertensive heart and chronic kidney disease with heart failure and stage 1 through stage 4 chronic kidney disease, or unspecified chronic kidney disease: Secondary | ICD-10-CM | POA: Diagnosis present

## 2019-09-19 DIAGNOSIS — Z79891 Long term (current) use of opiate analgesic: Secondary | ICD-10-CM

## 2019-09-19 DIAGNOSIS — Z87891 Personal history of nicotine dependence: Secondary | ICD-10-CM

## 2019-09-19 DIAGNOSIS — E876 Hypokalemia: Secondary | ICD-10-CM | POA: Diagnosis present

## 2019-09-19 DIAGNOSIS — Z7951 Long term (current) use of inhaled steroids: Secondary | ICD-10-CM

## 2019-09-19 DIAGNOSIS — E538 Deficiency of other specified B group vitamins: Secondary | ICD-10-CM | POA: Diagnosis present

## 2019-09-19 DIAGNOSIS — E871 Hypo-osmolality and hyponatremia: Secondary | ICD-10-CM | POA: Diagnosis present

## 2019-09-19 DIAGNOSIS — D649 Anemia, unspecified: Secondary | ICD-10-CM | POA: Diagnosis present

## 2019-09-19 DIAGNOSIS — R627 Adult failure to thrive: Secondary | ICD-10-CM | POA: Diagnosis present

## 2019-09-19 DIAGNOSIS — I252 Old myocardial infarction: Secondary | ICD-10-CM

## 2019-09-19 DIAGNOSIS — Z20828 Contact with and (suspected) exposure to other viral communicable diseases: Secondary | ICD-10-CM | POA: Diagnosis present

## 2019-09-19 DIAGNOSIS — E860 Dehydration: Secondary | ICD-10-CM | POA: Diagnosis present

## 2019-09-19 DIAGNOSIS — I272 Pulmonary hypertension, unspecified: Secondary | ICD-10-CM | POA: Diagnosis present

## 2019-09-19 DIAGNOSIS — J449 Chronic obstructive pulmonary disease, unspecified: Secondary | ICD-10-CM | POA: Diagnosis present

## 2019-09-19 DIAGNOSIS — F0150 Vascular dementia without behavioral disturbance: Secondary | ICD-10-CM | POA: Diagnosis present

## 2019-09-19 DIAGNOSIS — E1122 Type 2 diabetes mellitus with diabetic chronic kidney disease: Secondary | ICD-10-CM | POA: Diagnosis present

## 2019-09-19 DIAGNOSIS — Z8249 Family history of ischemic heart disease and other diseases of the circulatory system: Secondary | ICD-10-CM

## 2019-09-19 DIAGNOSIS — M109 Gout, unspecified: Secondary | ICD-10-CM | POA: Diagnosis present

## 2019-09-19 DIAGNOSIS — E872 Acidosis: Secondary | ICD-10-CM | POA: Diagnosis present

## 2019-09-19 DIAGNOSIS — I10 Essential (primary) hypertension: Secondary | ICD-10-CM | POA: Diagnosis not present

## 2019-09-19 DIAGNOSIS — I951 Orthostatic hypotension: Secondary | ICD-10-CM | POA: Diagnosis present

## 2019-09-19 DIAGNOSIS — N183 Chronic kidney disease, stage 3 unspecified: Secondary | ICD-10-CM | POA: Diagnosis present

## 2019-09-19 DIAGNOSIS — I2510 Atherosclerotic heart disease of native coronary artery without angina pectoris: Secondary | ICD-10-CM | POA: Diagnosis present

## 2019-09-19 DIAGNOSIS — M1712 Unilateral primary osteoarthritis, left knee: Secondary | ICD-10-CM | POA: Diagnosis present

## 2019-09-19 DIAGNOSIS — I5032 Chronic diastolic (congestive) heart failure: Secondary | ICD-10-CM | POA: Diagnosis present

## 2019-09-19 DIAGNOSIS — C3431 Malignant neoplasm of lower lobe, right bronchus or lung: Secondary | ICD-10-CM | POA: Diagnosis present

## 2019-09-19 DIAGNOSIS — D638 Anemia in other chronic diseases classified elsewhere: Secondary | ICD-10-CM | POA: Diagnosis present

## 2019-09-19 DIAGNOSIS — G9341 Metabolic encephalopathy: Secondary | ICD-10-CM | POA: Diagnosis present

## 2019-09-19 DIAGNOSIS — Z881 Allergy status to other antibiotic agents status: Secondary | ICD-10-CM

## 2019-09-19 DIAGNOSIS — Z923 Personal history of irradiation: Secondary | ICD-10-CM

## 2019-09-19 DIAGNOSIS — E119 Type 2 diabetes mellitus without complications: Secondary | ICD-10-CM

## 2019-09-19 LAB — CBC
HCT: 32.4 % — ABNORMAL LOW (ref 39.0–52.0)
Hemoglobin: 10.2 g/dL — ABNORMAL LOW (ref 13.0–17.0)
MCH: 31 pg (ref 26.0–34.0)
MCHC: 31.5 g/dL (ref 30.0–36.0)
MCV: 98.5 fL (ref 80.0–100.0)
Platelets: 440 10*3/uL — ABNORMAL HIGH (ref 150–400)
RBC: 3.29 MIL/uL — ABNORMAL LOW (ref 4.22–5.81)
RDW: 16.2 % — ABNORMAL HIGH (ref 11.5–15.5)
WBC: 6.8 10*3/uL (ref 4.0–10.5)
nRBC: 0 % (ref 0.0–0.2)

## 2019-09-19 LAB — BASIC METABOLIC PANEL
Anion gap: 13 (ref 5–15)
BUN: 13 mg/dL (ref 8–23)
CO2: 19 mmol/L — ABNORMAL LOW (ref 22–32)
Calcium: 8.9 mg/dL (ref 8.9–10.3)
Chloride: 101 mmol/L (ref 98–111)
Creatinine, Ser: 1.29 mg/dL — ABNORMAL HIGH (ref 0.61–1.24)
GFR calc Af Amer: >60 mL/min (ref >60)
GFR calc non Af Amer: 54 mL/min — ABNORMAL LOW (ref >60)
Glucose, Bld: 138 mg/dL — ABNORMAL HIGH (ref 70–99)
Potassium: 3.1 mmol/L — ABNORMAL LOW (ref 3.5–5.1)
Sodium: 133 mmol/L — ABNORMAL LOW (ref 135–145)

## 2019-09-19 LAB — CBG MONITORING, ED: Glucose-Capillary: 120 mg/dL — ABNORMAL HIGH (ref 70–99)

## 2019-09-19 MED ORDER — MEGESTROL ACETATE 400 MG/10ML PO SUSP
400.0000 mg | Freq: Every day | ORAL | Status: DC
  Administered 2019-09-20 – 2019-09-24 (×5): 400 mg via ORAL
  Filled 2019-09-19 (×5): qty 10

## 2019-09-19 MED ORDER — ALLOPURINOL 100 MG PO TABS
300.0000 mg | ORAL_TABLET | Freq: Every day | ORAL | Status: DC
  Administered 2019-09-20 – 2019-09-24 (×5): 300 mg via ORAL
  Filled 2019-09-19 (×3): qty 3
  Filled 2019-09-19: qty 1
  Filled 2019-09-19: qty 3

## 2019-09-19 MED ORDER — PANTOPRAZOLE SODIUM 40 MG PO TBEC
40.0000 mg | DELAYED_RELEASE_TABLET | Freq: Two times a day (BID) | ORAL | Status: DC
  Administered 2019-09-20 – 2019-09-24 (×10): 40 mg via ORAL
  Filled 2019-09-19 (×10): qty 1

## 2019-09-19 MED ORDER — QUETIAPINE FUMARATE 25 MG PO TABS
25.0000 mg | ORAL_TABLET | Freq: Two times a day (BID) | ORAL | Status: DC
  Administered 2019-09-19 – 2019-09-20 (×2): 25 mg via ORAL
  Filled 2019-09-19 (×3): qty 1

## 2019-09-19 MED ORDER — SENNOSIDES-DOCUSATE SODIUM 8.6-50 MG PO TABS
1.0000 | ORAL_TABLET | Freq: Every day | ORAL | Status: DC
  Administered 2019-09-20 – 2019-09-23 (×4): 1 via ORAL
  Filled 2019-09-19 (×4): qty 1

## 2019-09-19 MED ORDER — MOMETASONE FURO-FORMOTEROL FUM 200-5 MCG/ACT IN AERO
2.0000 | INHALATION_SPRAY | Freq: Two times a day (BID) | RESPIRATORY_TRACT | Status: DC
  Administered 2019-09-20 – 2019-09-24 (×7): 2 via RESPIRATORY_TRACT
  Filled 2019-09-19 (×2): qty 8.8

## 2019-09-19 MED ORDER — SODIUM CHLORIDE 0.9 % IV BOLUS
500.0000 mL | Freq: Once | INTRAVENOUS | Status: AC
  Administered 2019-09-19: 500 mL via INTRAVENOUS

## 2019-09-19 MED ORDER — POTASSIUM CHLORIDE CRYS ER 20 MEQ PO TBCR
40.0000 meq | EXTENDED_RELEASE_TABLET | Freq: Once | ORAL | Status: AC
  Administered 2019-09-19: 40 meq via ORAL
  Filled 2019-09-19: qty 2

## 2019-09-19 MED ORDER — MAGNESIUM OXIDE 400 (241.3 MG) MG PO TABS
200.0000 mg | ORAL_TABLET | Freq: Two times a day (BID) | ORAL | Status: DC
  Administered 2019-09-19 – 2019-09-24 (×10): 200 mg via ORAL
  Filled 2019-09-19 (×10): qty 1

## 2019-09-19 MED ORDER — FOLIC ACID 1 MG PO TABS
1.0000 mg | ORAL_TABLET | Freq: Every day | ORAL | Status: DC
  Administered 2019-09-20 – 2019-09-24 (×5): 1 mg via ORAL
  Filled 2019-09-19 (×5): qty 1

## 2019-09-19 MED ORDER — AMLODIPINE BESYLATE 10 MG PO TABS
10.0000 mg | ORAL_TABLET | Freq: Every day | ORAL | Status: DC
  Administered 2019-09-20: 10 mg via ORAL
  Filled 2019-09-19: qty 2

## 2019-09-19 MED ORDER — SODIUM CHLORIDE 0.45 % IV SOLN
INTRAVENOUS | Status: AC
  Administered 2019-09-20: 01:00:00 via INTRAVENOUS

## 2019-09-19 MED ORDER — ACETAMINOPHEN 325 MG PO TABS
650.0000 mg | ORAL_TABLET | Freq: Four times a day (QID) | ORAL | Status: DC | PRN
  Administered 2019-09-20 – 2019-09-23 (×2): 650 mg via ORAL
  Filled 2019-09-19 (×2): qty 2

## 2019-09-19 MED ORDER — ENOXAPARIN SODIUM 40 MG/0.4ML ~~LOC~~ SOLN
40.0000 mg | SUBCUTANEOUS | Status: DC
  Administered 2019-09-20 – 2019-09-22 (×3): 40 mg via SUBCUTANEOUS
  Filled 2019-09-19 (×3): qty 0.4

## 2019-09-19 MED ORDER — ATORVASTATIN CALCIUM 10 MG PO TABS
20.0000 mg | ORAL_TABLET | Freq: Every day | ORAL | Status: DC
  Administered 2019-09-20 – 2019-09-24 (×5): 20 mg via ORAL
  Filled 2019-09-19 (×5): qty 2

## 2019-09-19 MED ORDER — POTASSIUM CHLORIDE CRYS ER 20 MEQ PO TBCR
20.0000 meq | EXTENDED_RELEASE_TABLET | Freq: Two times a day (BID) | ORAL | Status: DC
  Administered 2019-09-19 – 2019-09-24 (×10): 20 meq via ORAL
  Filled 2019-09-19 (×10): qty 1

## 2019-09-19 MED ORDER — SODIUM CHLORIDE 0.9% FLUSH
3.0000 mL | Freq: Once | INTRAVENOUS | Status: AC
  Administered 2019-09-19: 3 mL via INTRAVENOUS

## 2019-09-19 MED ORDER — VITAMIN B-12 1000 MCG PO TABS
1000.0000 ug | ORAL_TABLET | Freq: Every day | ORAL | Status: DC
  Administered 2019-09-20 – 2019-09-24 (×5): 1000 ug via ORAL
  Filled 2019-09-19 (×5): qty 1

## 2019-09-19 MED ORDER — SUCRALFATE 1 G PO TABS
1.0000 g | ORAL_TABLET | Freq: Four times a day (QID) | ORAL | Status: DC
  Administered 2019-09-19 – 2019-09-24 (×17): 1 g via ORAL
  Filled 2019-09-19 (×16): qty 1

## 2019-09-19 MED ORDER — FERROUS SULFATE 325 (65 FE) MG PO TABS
325.0000 mg | ORAL_TABLET | ORAL | Status: DC
  Administered 2019-09-20 – 2019-09-24 (×3): 325 mg via ORAL
  Filled 2019-09-19 (×3): qty 1

## 2019-09-19 MED ORDER — ALBUTEROL SULFATE (2.5 MG/3ML) 0.083% IN NEBU: 3.0000 mL | INHALATION_SOLUTION | RESPIRATORY_TRACT | Status: DC | PRN

## 2019-09-19 MED ORDER — SODIUM CHLORIDE 0.9% FLUSH
3.0000 mL | Freq: Two times a day (BID) | INTRAVENOUS | Status: DC
  Administered 2019-09-19 – 2019-09-24 (×7): 3 mL via INTRAVENOUS

## 2019-09-19 MED ORDER — TRAMADOL HCL 50 MG PO TABS: 50.0000 mg | ORAL_TABLET | Freq: Four times a day (QID) | ORAL | Status: DC | PRN

## 2019-09-19 NOTE — H&P (Signed)
History and Physical    John [name] IWL:[id number] DOB: [birth year] DOA: 09/19/2019  PCP: [name], NP (Inactive) (Confirm with patient/family/NH records and if not entered, this has to be entered at [facility] point of entry) Patient coming from: Is coming from home  I have personally briefly reviewed patient's old medical records in [place]  Chief Complaint: Prolonged syncopal episode with myoclonic movement  HPI: John [name] is a 73 y.o. male with medical history significant of multiple recent admissions for orthostatic hypotension and evaluation of syncope.  He had a hospitalization in early September for upper GI bleed with a duodenal ulcer.  Patient's had acute kidney injury that was prerenal secondary to dehydration.  Patient has completed a course of stereotactic radiation therapy for recurrent non-small cell cancer of the lung.  He has tolerated his treatments fairly well but his family reports he has intermittent problems with imbalance.  Per the family the patient has had 3 episodes of syncope in the last week.  Last episode was the day of admission where by his daughter's report he had episode of syncope lasting approximately 15 minutes.  She was holding him during this period and noted that his eyes were rolled back, he had a snoring type of breathing pattern and had myoclonic movement.  EMS was called.  He regained consciousness about the time of their arrival.  His daughter reports he remained somewhat confused at the time  he was transported.  He has multiple medical problems including diabetes hypertension hyperlipidemia and dementia.  He was brought to the [facility] emergency department for further evaluation  ED Course: Patient was hemodynamically stable in the emergency department.  He was fully awake.  Laboratory revealed a mild anemia, mild hypokalemia.  IV fluids were initiated in the emergency department.  [facility] is referred to [facility] for admission  and evaluation for syncope and possible seizure.  Review of Systems: As per HPI otherwise 10 point review of systems negative. Except, he c/o persistent pain in the left knee. He denies any respiratory symptoms, abdominal pain, N/V.   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   MI (myocardial infarction) ([place])    Pulmonary hypertension ([place])     Past Surgical History:  Procedure Laterality Date   APPENDECTOMY     BIOPSY  04/21/2019   Procedure: BIOPSY;  Surgeon: Gatha Mayer, MD;  Location: [facility] ENDOSCOPY;  Service: Endoscopy;;   ESOPHAGOGASTRODUODENOSCOPY (EGD) WITH PROPOFOL N/A 04/21/2019   Procedure: ESOPHAGOGASTRODUODENOSCOPY (EGD) WITH PROPOFOL;  Surgeon: Gatha Mayer, MD;  Location: [facility] ENDOSCOPY;  Service: Endoscopy;  Laterality: N/A;   ESOPHAGOGASTRODUODENOSCOPY (EGD) WITH PROPOFOL N/A 04/29/2019   Procedure: ESOPHAGOGASTRODUODENOSCOPY (EGD) WITH PROPOFOL;  Surgeon: Mauri Pole, MD;  Location: [place] ENDOSCOPY;  Service: Endoscopy;  Laterality: N/A;   HEMOSTASIS CLIP PLACEMENT  04/29/2019   Procedure: HEMOSTASIS CLIP PLACEMENT;  Surgeon: Mauri Pole, MD;  Location: [place] ENDOSCOPY;  Service: Endoscopy;;  Clip placed as marker not for bleeding control   IR ANGIOGRAM SELECTIVE EACH ADDITIONAL VESSEL  04/29/2019   IR ANGIOGRAM SELECTIVE EACH ADDITIONAL VESSEL  04/29/2019   IR ANGIOGRAM  SELECTIVE EACH ADDITIONAL VESSEL  04/29/2019   IR ANGIOGRAM VISCERAL SELECTIVE  04/29/2019   IR EMBO ART  VEN HEMORR LYMPH EXTRAV  INC GUIDE ROADMAPPING  04/29/2019   IR US GUIDE VASC ACCESS RIGHT  04/29/2019   LUNG BIOPSY     Soc Hx - worked as a Horticulturist, commercial. Retired about 10 years  ago but did small jobs. Was married for about 10 years then seperated. He has one son, one daughter, 3 grand-daughters. He lives in his own brick home with his daughter. She holds POA. He had a palliative care consult during recent admission: patient stated he did not want heroic measures but his daughter felt he was unable to make an informed decision and he is full code at this time.   reports that he quit smoking about 6 years ago. His smoking use included cigarettes. He has a 90.00 pack-year smoking history. He has never used smokeless tobacco. He reports that he does not drink alcohol or use drugs.  Allergies  Allergen Reactions   Levaquin [Levofloxacin In D5w] Other (See Comments)    Unknown reaction   Lisinopril Swelling    Facial swelling    Family History  Problem Relation Age of Onset   Heart disease Mother    Cancer Neg Hx      Prior to Admission medications   Medication Sig Start Date End Date Taking? Authorizing Provider  acetaminophen (TYLENOL) 325 MG tablet Take 2 tablets (650 mg total) by mouth every 6 (six) hours as needed for mild pain (or Fever >/= 101). 09/05/19  Yes Eugenie Filler, MD  albuterol (PROAIR HFA) 108 (90 BASE) MCG/ACT inhaler INHALE 2 PUFFS BY MOUTH EVERY 4 HOURS AS NEEDED FOR WHEEZING Patient taking differently: Inhale 2 puffs into the lungs every 4 (four) hours as needed for wheezing.  03/13/15  Yes Tanda Rockers, MD  allopurinol (ZYLOPRIM) 300 MG tablet Take 300 mg by mouth daily. 04/07/19  Yes [provider]  amLODipine (NORVASC) 10 MG tablet Take 10 mg by mouth daily. 05/07/19  Yes [provider]  atorvastatin (LIPITOR) 20 MG tablet Take 1 tablet (20 mg total) by mouth daily. 09/26/16  Yes Tanda Rockers, MD  budesonide-formoterol (SYMBICORT) 160-4.5 MCG/ACT inhaler Inhale 2 puffs into the lungs 2 (two) times daily.   Yes [provider]  Cholecalciferol (VITAMIN D3) 50 MCG (2000 UT) capsule Take 2,000 Units by mouth  daily.  05/07/19  Yes [provider]  ferrous sulfate 325 (65 FE) MG tablet Take 1 tablet (325 mg total) by mouth every Monday, Wednesday, and Friday. 08/11/19 03/08/20 Yes Rai, Ripudeep K, MD  folic acid (FOLVITE) 1 MG tablet Take 1 tablet (1 mg total) by mouth daily. 04/09/19 04/08/20 Yes Donne Hazel, MD  megestrol (MEGACE) 400 MG/10ML suspension Take 10 mLs (400 mg total) by mouth daily. 09/05/19  Yes Eugenie Filler, MD  Multiple Vitamin (MULTIVITAMIN WITH MINERALS) TABS tablet Take 1 tablet by mouth daily. Over the counter 08/12/19  Yes Rai, Ripudeep K, MD  pantoprazole (PROTONIX) 40 MG tablet Take 1 tablet (40 mg total) by mouth 2 (two) times daily before a meal. 05/28/19  Yes Nandigam, Venia Minks, MD  QUEtiapine (SEROQUEL) 25 MG tablet Take 1 tablet (25 mg total) by mouth 2 (two) times daily. 08/11/19  Yes Rai, Ripudeep K, MD  senna-docusate (SENOKOT-S) 8.6-50 MG tablet Take 1 tablet by mouth at bedtime. 05/01/19  Yes Florencia Reasons, MD  sucralfate (CARAFATE) 1 g tablet  Take 1 g by mouth 4 (four) times daily.  06/23/19  Yes [provider]  traMADol (ULTRAM) 50 MG tablet Take 1 tablet (50 mg total) by mouth every 6 (six) hours as needed for moderate pain. 09/17/19  Yes Daleen Bo, MD  vitamin B-12 1000 MCG tablet Take 1 tablet (1,000 mcg total) by mouth daily. 08/12/19  Yes Rai, Ripudeep K, MD  Vitamin D, Ergocalciferol, (DRISDOL) 1.25 MG (50000 UT) CAPS capsule Take 50,000 Units by mouth every Friday.  03/10/19  Yes [provider]  Magnesium Oxide 200 MG TABS Take 1 tablet (200 mg total) by mouth 2 (two) times daily. 09/17/19   Daleen Bo, MD  potassium chloride SA (K-DUR) 20 MEQ tablet Take 1 tablet (20 mEq total) by mouth 2 (two) times daily. 09/17/19   Daleen Bo, MD    Physical Exam: Vitals:   09/19/19 2130 09/19/19 2145 09/19/19 2200 09/19/19 2215  BP: 137/79 (!) 121/98 (!) 135/108 (!) 134/119  Pulse: 98 96 98 96  Resp: 20 19 (!) 24 (!) 25  Temp:       TempSrc:      SpO2: 100% 99% (!) 81% 100%    Constitutional: NAD, calm, comfortable Vitals:   09/19/19 2130 09/19/19 2145 09/19/19 2200 09/19/19 2215  BP: 137/79 (!) 121/98 (!) 135/108 (!) 134/119  Pulse: 98 96 98 96  Resp: 20 19 (!) 24 (!) 25  Temp:      TempSrc:      SpO2: 100% 99% (!) 81% 100%   General Appearance:  Elderly man in no distress, fully awake and alert, conversant. Eyes: PERRL, lids and conjunctivae normal ENMT: Mucous membranes are moist. Posterior pharynx clear of any exudate or lesions.Normal dentition.  Neck: normal, supple, no masses, no thyromegaly Respiratory: clear to auscultation bilaterally, no wheezing, no crackles. Normal respiratory effort. No accessory muscle use.  Cardiovascular: Regular rate and rhythm, no murmurs / rubs / gallops. No extremity edema. 2+ pedal pulses. No carotid bruits.  Abdomen: no tenderness, no masses palpated. No hepatosplenomegaly. Bowel sounds positive.  Musculoskeletal: no clubbing / cyanosis. Hands with slightly enlarged PIP/DIP joints with preserved ROM. Left knee with heat at medial aspect with mild enlargement, pain with movement. Right knee normal. No tophi noted. Good ROM, no contractures. Normal muscle tone.  Skin: no rashes, lesions, ulcers. No induration Neurologic: CN 2-12 grossly intact. MAE to command. No tremor. Sensation intact, DTR normal. Strength 5/5 in all 4.  Psychiatric: . Alert and oriented x 3. Normal and positive mood. Cognitive testing not done.    Labs on Admission: I have personally reviewed following labs and imaging studies  CBC: Recent Labs  Lab 09/17/19 1514 09/19/19 2056  WBC 9.5 6.8  NEUTROABS 8.3*  --   HGB 9.9* 10.2*  HCT 30.8* 32.4*  MCV 96.3 98.5  PLT 383 347*   Basic Metabolic Panel: Recent Labs  Lab 09/17/19 1514 09/19/19 2056  NA 141 133*  K 3.0* 3.1*  CL 107 101  CO2 23 19*  GLUCOSE 123* 138*  BUN 15 13  CREATININE 1.43* 1.29*  CALCIUM 9.3 8.9   GFR: Estimated  Creatinine Clearance: 53.6 mL/min (A) (by C-G formula based on SCr of 1.29 mg/dL (H)). Liver Function Tests: No results for input(s): AST, ALT, ALKPHOS, BILITOT, PROT, ALBUMIN in the last 168 hours. No results for input(s): LIPASE, AMYLASE in the last 168 hours. No results for input(s): AMMONIA in the last 168 hours. Coagulation Profile: No results for input(s): INR, PROTIME  in the last 168 hours. Cardiac Enzymes: No results for input(s): CKTOTAL, CKMB, CKMBINDEX, TROPONINI in the last 168 hours. BNP (last 3 results) No results for input(s): PROBNP in the last 8760 hours. HbA1C: No results for input(s): HGBA1C in the last 72 hours. CBG: Recent Labs  Lab 09/19/19 2214  GLUCAP 120*   Lipid Profile: No results for input(s): CHOL, HDL, LDLCALC, TRIG, CHOLHDL, LDLDIRECT in the last 72 hours. Thyroid Function Tests: No results for input(s): TSH, T4TOTAL, FREET4, T3FREE, THYROIDAB in the last 72 hours. Anemia Panel: No results for input(s): VITAMINB12, FOLATE, FERRITIN, TIBC, IRON, RETICCTPCT in the last 72 hours. Urine analysis:    Component Value Date/Time   COLORURINE AMBER (A) 09/01/2019 2327   APPEARANCEUR HAZY (A) 09/01/2019 2327   LABSPEC 1.020 09/01/2019 2327   PHURINE 5.0 09/01/2019 2327   GLUCOSEU NEGATIVE 09/01/2019 2327   HGBUR NEGATIVE 09/01/2019 2327   BILIRUBINUR NEGATIVE 09/01/2019 2327   KETONESUR NEGATIVE 09/01/2019 2327   PROTEINUR 30 (A) 09/01/2019 2327   UROBILINOGEN 1.0 02/22/2012 0238   NITRITE NEGATIVE 09/01/2019 2327   LEUKOCYTESUR TRACE (A) 09/01/2019 2327    Radiological Exams on Admission: No results found.  EKG: Independently reviewed. Sinus at 96 ppm, no acute change. Similar to previous EKG  Assessment/Plan Active Problems:   Chronic diastolic CHF (congestive heart failure) (HCC)   Diabetes mellitus (HCC)   HTN (hypertension)   Primary cancer of right lower lobe of lung (HCC)   CKD (chronic kidney disease), stage III (HCC)   Low  hemoglobin   Syncope   Hypokalemia  1. Syncope - recurrent syncope, now with a concern for possible seizure with witnessed myoclonic movement. No incontinenence. No definitive post-ictal symptoms. In recent past he has been orthostatic. Plan Telemetry admit/observation  EEG  2D echo  Orthostatics q shift  2. DM - recent A1C 4.6%. Poor appetite. Oral meds were stopped at discharge from 09/01/2019 admission. Plan AM CBGs  Low carb diet  3. Hypokalemia - mild Plan Continue oral replacement K-Dur  F/u Bmet in AM  4. HTN - will continue home medications Plan Orthostatic vitals q shift  5. chronic dCHF - no signs of decompensation. Plan BNP in AM  6. GI - recent duodenal ulcer with UGI bleed. Hgb improviing Plan Continue PPI therapy and carafate  F/u CBC in AM  7. Oncology - has complete SBRT for recurrent non-small cell lung cancer. This was curative treatment. Plan Outpatient follow up as directed by oncology  DVT prophylaxis: lovenox (Lovenox/Heparin/SCD's/anticoagulated/None (if comfort care) Code Status: full code - discussed with POA. Previous Palliative Care note reviewed (Full/Partial (specify details) Family Communication: Spoke with dtr Willette Cluster - please call her again tomorrow (Specify name, relationship. Do not write "discussed with patient". Specify tel # if discussed over the phone) Disposition Plan: home  48 hrs (specify when and where you expect patient to be discharged) Consults called: none (with names) Admission status: tele-observation. (inpatient / obs / tele / medical floor / SDU)   Adella Hare MD [facility] Pager [phone]  If 7PM-7AM, please contact night-coverage [email or web address] Password [facility]  09/19/2019, 10:57 PM

## 2019-09-19 NOTE — ED Provider Notes (Signed)
[facility] EMERGENCY DEPARTMENT Provider Note   CSN: [id number] Arrival date & time: 09/19/19  2024     History   Chief Complaint Chief Complaint  Patient presents with  . Loss of Consciousness    HPI John [name] is a 73 y.o. male.     Patient with congestive heart failure, diabetes, vascular dementia, gastric ulcers, high blood pressure, heart attack history presents after multiple syncopal episodes this week.  Patient had an episode earlier this week, 1 yesterday and then another one this evening were followed by jerking motion 15 seconds.  Patient currently at baseline.     Past Medical History:  Diagnosis Date  . B12 deficiency   . CHF (congestive heart failure) ([place])   . COPD (chronic obstructive pulmonary disease) ([place])   . Diabetes mellitus without complication ([place])   . Folate deficiency   . Gout   . Hyperlipemia   . Hypertension   . 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 MI (myocardial infarction) ([place])   . Pulmonary hypertension [facility])     Patient Active Problem List   Diagnosis Date Noted  . FTT (failure to thrive) in adult   . Iron deficiency anemia   . ARF (acute renal failure) ([place]) 09/02/2019  . Hypomagnesemia 09/02/2019  . Acute pain of left knee   . Acute metabolic encephalopathy 08/67/6195  . Goals of care, counseling/discussion 08/05/2019  . Acute renal failure (ARF) ([place]) 07/08/2019  . Hypokalemia 07/08/2019  . Vascular dementia without behavioral disturbance ([place])   . Helicobacter pylori gastritis   . Chronic duodenal ulcer with hemorrhage   . Multiple gastric ulcers   . Acute esophagitis   . Gastrointestinal hemorrhage 04/20/2019  . Vitamin B12 deficiency 04/20/2019  . Folate deficiency 04/20/2019  . Dehydration 04/20/2019  . Hypotension 04/20/2019  . Melena   . Acute blood loss anemia   . CKD (chronic kidney disease), stage III ([place]) 04/08/2019  . Right lower lobe lung mass  04/08/2019  . Syncope and collapse 04/08/2019  . Symptomatic anemia 04/08/2019  . Occult GI bleeding 04/08/2019  . Hyperglycemia 04/08/2019  . Leukocytosis 04/08/2019  . Syncope 04/08/2019  . Low hemoglobin   . Dyspnea on exertion 08/25/2017  . Bursitis of elbow 03/15/2016  . Obesity (BMI 30-39.9) 12/03/2015  . Primary cancer of right lower lobe of lung ([place]) 06/14/2015  . Solitary pulmonary nodule 03/13/2015  . RVF (right ventricular failure) ([place]) 11/04/2014  . COPD II/III with reversibility  12/19/2012  . Chronic respiratory failure with hypoxia ([place]) 12/18/2012  . Drug-induced hyperglycemia 12/08/2012  . Allergic drug rash 12/06/2012  . Pulmonary HTN ([place]) 03/18/2012  . Abnormal echocardiogram 03/18/2012  . LV dysfunction 03/18/2012  . COPD exacerbation ([place]) 02/22/2012  . Chronic diastolic CHF (congestive heart failure) ([place]) 02/22/2012  . Diabetes mellitus ([place]) 02/22/2012  . HTN (hypertension) 02/22/2012  . CAD (coronary artery disease) 02/22/2012  . Acute on chronic systolic heart failure ([place]) 02/22/2012    Past Surgical History:  Procedure Laterality Date  . APPENDECTOMY    . BIOPSY  04/21/2019   Procedure: BIOPSY;  Surgeon: Gatha Mayer, MD;  Location: [facility];  Service: Endoscopy;;  . ESOPHAGOGASTRODUODENOSCOPY (EGD) WITH PROPOFOL N/A 04/21/2019   Procedure: ESOPHAGOGASTRODUODENOSCOPY (EGD) WITH PROPOFOL;  Surgeon: Gatha Mayer, MD;  Location: [facility];  Service: Endoscopy;  Laterality: N/A;  . ESOPHAGOGASTRODUODENOSCOPY (EGD) WITH PROPOFOL N/A 04/29/2019   Procedure: ESOPHAGOGASTRODUODENOSCOPY (EGD) WITH  PROPOFOL;  Surgeon: Mauri Pole, MD;  Location[facility] ENDOSCOPY;  Service: Endoscopy;  Laterality: N/A;  . HEMOSTASIS CLIP PLACEMENT  04/29/2019   Procedure: HEMOSTASIS CLIP PLACEMENT;  Surgeon: Mauri Pole, MD;  Location: [facility] ENDOSCOPY;  Service: Endoscopy;;  Clip placed as marker not for bleeding control  . IR ANGIOGRAM SELECTIVE EACH  ADDITIONAL VESSEL  04/29/2019  . IR ANGIOGRAM SELECTIVE EACH ADDITIONAL VESSEL  04/29/2019  . IR ANGIOGRAM SELECTIVE EACH ADDITIONAL VESSEL  04/29/2019  . IR ANGIOGRAM VISCERAL SELECTIVE  04/29/2019  . IR EMBO ART  VEN HEMORR LYMPH EXTRAV  INC GUIDE ROADMAPPING  04/29/2019  . IR US GUIDE VASC ACCESS RIGHT  04/29/2019  . LUNG BIOPSY          Home Medications    Prior to Admission medications   Medication Sig Start Date End Date Taking? Authorizing Provider  acetaminophen (TYLENOL) 325 MG tablet Take 2 tablets (650 mg total) by mouth every 6 (six) hours as needed for mild pain (or Fever >/= 101). 09/05/19  Yes Eugenie Filler, MD  albuterol (PROAIR HFA) 108 (90 BASE) MCG/ACT inhaler INHALE 2 PUFFS BY MOUTH EVERY 4 HOURS AS NEEDED FOR WHEEZING Patient taking differently: Inhale 2 puffs into the lungs every 4 (four) hours as needed for wheezing.  03/13/15  Yes Tanda Rockers, MD  allopurinol (ZYLOPRIM) 300 MG tablet Take 300 mg by mouth daily. 04/07/19  Yes [provider]  amLODipine (NORVASC) 10 MG tablet Take 10 mg by mouth daily. 05/07/19  Yes [provider]  atorvastatin (LIPITOR) 20 MG tablet Take 1 tablet (20 mg total) by mouth daily. 09/26/16  Yes Tanda Rockers, MD  budesonide-formoterol (SYMBICORT) 160-4.5 MCG/ACT inhaler Inhale 2 puffs into the lungs 2 (two) times daily.   Yes [provider]  Cholecalciferol (VITAMIN D3) 50 MCG (2000 UT) capsule Take 2,000 Units by mouth daily.  05/07/19  Yes [provider]  ferrous sulfate 325 (65 FE) MG tablet Take 1 tablet (325 mg total) by mouth every Monday, Wednesday, and Friday. 08/11/19 03/08/20 Yes Rai, Ripudeep K, MD  folic acid (FOLVITE) 1 MG tablet Take 1 tablet (1 mg total) by mouth daily. 04/09/19 04/08/20 Yes Donne Hazel, MD  megestrol (MEGACE) 400 MG/10ML suspension Take 10 mLs (400 mg total) by mouth daily. 09/05/19  Yes Eugenie Filler, MD  Multiple Vitamin (MULTIVITAMIN WITH MINERALS) TABS tablet Take  1 tablet by mouth daily. Over the counter 08/12/19  Yes Rai, Ripudeep K, MD  pantoprazole (PROTONIX) 40 MG tablet Take 1 tablet (40 mg total) by mouth 2 (two) times daily before a meal. 05/28/19  Yes Nandigam, Venia Minks, MD  QUEtiapine (SEROQUEL) 25 MG tablet Take 1 tablet (25 mg total) by mouth 2 (two) times daily. 08/11/19  Yes Rai, Ripudeep K, MD  senna-docusate (SENOKOT-S) 8.6-50 MG tablet Take 1 tablet by mouth at bedtime. 05/01/19  Yes Florencia Reasons, MD  sucralfate (CARAFATE) 1 g tablet Take 1 g by mouth 4 (four) times daily.  06/23/19  Yes [provider]  traMADol (ULTRAM) 50 MG tablet Take 1 tablet (50 mg total) by mouth every 6 (six) hours as needed for moderate pain. 09/17/19  Yes Daleen Bo, MD  vitamin B-12 1000 MCG tablet Take 1 tablet (1,000 mcg total) by mouth daily. 08/12/19  Yes Rai, Ripudeep K, MD  Vitamin D, Ergocalciferol, (DRISDOL) 1.25 MG (50000 UT) CAPS capsule Take 50,000 Units by mouth every Friday.  03/10/19  Yes [provider]  Magnesium Oxide 200 MG TABS Take 1 tablet (200 mg total) by mouth 2 (two) times daily. 09/17/19   Daleen Bo, MD  potassium chloride SA (K-DUR) 20 MEQ tablet Take 1 tablet (20 mEq total) by mouth 2 (two) times daily. 09/17/19   Daleen Bo, MD    Family History Family History  Problem Relation Age of Onset  . Heart disease Mother   . Cancer Neg Hx     Social History Social History   Tobacco Use  . Smoking status: Former Smoker    Packs/day: 1.50    Years: 60.00    Pack years: 90.00    Types: Cigarettes    Quit date: 09/29/2012    Years since quitting: 6.9  . Smokeless tobacco: Never Used  Substance Use Topics  . Alcohol use: No    Alcohol/week: 0.0 standard drinks  . Drug use: No     Allergies   Levaquin [levofloxacin in d5w] and Lisinopril   Review of Systems Review of Systems  Constitutional: Negative for chills and fever.  HENT: Negative for congestion.   Eyes: Negative for visual disturbance.   Respiratory: Negative for shortness of breath.   Cardiovascular: Negative for chest pain.  Gastrointestinal: Negative for abdominal pain and vomiting.  Genitourinary: Negative for dysuria and flank pain.  Musculoskeletal: Negative for back pain, neck pain and neck stiffness.  Skin: Negative for rash.  Neurological: Positive for syncope and light-headedness. Negative for headaches.     Physical Exam Updated Vital Signs BP (!) 134/119   Pulse 96   Temp 98.6 F (37 C) (Oral)   Resp (!) 25   SpO2 100%   Physical Exam Vitals signs and nursing note reviewed.  Constitutional:      Appearance: He is well-developed.  HENT:     Head: Normocephalic and atraumatic.     Comments: Dry mm Eyes:     General:        Right eye: No discharge.        Left eye: No discharge.     Conjunctiva/sclera: Conjunctivae normal.  Neck:     Musculoskeletal: Normal range of motion and neck supple.     Trachea: No tracheal deviation.  Cardiovascular:     Rate and Rhythm: Normal rate and regular rhythm.  Pulmonary:     Effort: Pulmonary effort is normal.     Breath sounds: Normal breath sounds.  Abdominal:     General: There is no distension.     Palpations: Abdomen is soft.     Tenderness: There is no abdominal tenderness. There is no guarding.  Musculoskeletal:        General: No swelling.  Skin:    General: Skin is warm.  Neurological:     General: No focal deficit present.     Mental Status: He is alert.     GCS: GCS eye subscore is 4. GCS verbal subscore is 5. GCS motor subscore is 6.     Cranial Nerves: Cranial nerves are intact.     Motor: Motor function is intact.     Comments: Pleasant dementia  Psychiatric:        Speech: Speech is not rapid and pressured.        Behavior: Behavior is not combative.     Comments: pleasant      ED Treatments / Results  Labs (all labs ordered are listed, but only abnormal results are displayed) Labs Reviewed  BASIC METABOLIC PANEL - Abnormal;  Notable for the following  components:      Result Value   Sodium 133 (*)    Potassium 3.1 (*)    CO2 19 (*)    Glucose, Bld 138 (*)    Creatinine, Ser 1.29 (*)    GFR calc non Af Amer 54 (*)    All other components within normal limits  CBC - Abnormal; Notable for the following components:   RBC 3.29 (*)    Hemoglobin 10.2 (*)    HCT 32.4 (*)    RDW 16.2 (*)    Platelets 440 (*)    All other components within normal limits  CBG MONITORING, ED - Abnormal; Notable for the following components:   Glucose-Capillary 120 (*)    All other components within normal limits  SARS CORONAVIRUS 2 (TAT 6-24 HRS)  URINALYSIS, ROUTINE W REFLEX MICROSCOPIC  BASIC METABOLIC PANEL  CBC  BRAIN NATRIURETIC PEPTIDE    EKG EKG Interpretation  Date/Time:  Sunday September 19 2019 20:37:54 EDT Ventricular Rate:  96 PR Interval:    QRS Duration: 93 QT Interval:  362 QTC Calculation: 458 R Axis:   18 Text Interpretation:  Sinus rhythm Probable left ventricular hypertrophy Nonspecific T abnormalities, anterior leads Overall similar previous Confirmed by Elnora Morrison [phone]) on 09/19/2019 9:24:18 PM   Radiology No results found.  Procedures Procedures (including critical care time)  Medications Ordered in ED Medications  acetaminophen (TYLENOL) tablet 650 mg (has no administration in time range)  allopurinol (ZYLOPRIM) tablet 300 mg (has no administration in time range)  traMADol (ULTRAM) tablet 50 mg (has no administration in time range)  megestrol (MEGACE) 400 MG/10ML suspension 400 mg (has no administration in time range)  amLODipine (NORVASC) tablet 10 mg (has no administration in time range)  atorvastatin (LIPITOR) tablet 20 mg (has no administration in time range)  QUEtiapine (SEROQUEL) tablet 25 mg (25 mg Oral Given 09/19/19 2349)  pantoprazole (PROTONIX) EC tablet 40 mg (has no administration in time range)  senna-docusate (Senokot-S) tablet 1 tablet (1 tablet Oral Refused 09/19/19  2349)  sucralfate (CARAFATE) tablet 1 g (1 g Oral Given 09/19/19 2349)  ferrous sulfate tablet 325 mg (has no administration in time range)  folic acid (FOLVITE) tablet 1 mg (has no administration in time range)  vitamin B-12 (CYANOCOBALAMIN) tablet 1,000 mcg (has no administration in time range)  magnesium oxide (MAG-OX) tablet 200 mg (200 mg Oral Given 09/19/19 2349)  potassium chloride SA (K-DUR) CR tablet 20 mEq (20 mEq Oral Given 09/19/19 2349)  albuterol (PROVENTIL) (2.5 MG/3ML) 0.083% nebulizer solution 3 mL (has no administration in time range)  mometasone-formoterol (DULERA) 200-5 MCG/ACT inhaler 2 puff (has no administration in time range)  sodium chloride flush (NS) 0.9 % injection 3 mL (3 mLs Intravenous Given 09/19/19 2318)  enoxaparin (LOVENOX) injection 40 mg (has no administration in time range)  0.45 % sodium chloride infusion (has no administration in time range)  sodium chloride flush (NS) 0.9 % injection 3 mL (3 mLs Intravenous Given 09/19/19 2133)  potassium chloride SA (K-DUR) CR tablet 40 mEq (40 mEq Oral Given 09/19/19 2203)  sodium chloride 0.9 % bolus 500 mL (0 mLs Intravenous Stopped 09/19/19 2349)     Initial Impression / Assessment and Plan / ED Course  I have reviewed the triage vital signs and the nursing notes.  Pertinent labs & imaging results that were available during my care of the patient were reviewed by me and considered in my medical decision making (see chart for details).  Patient presents after multiple syncopal episodes.  With cardiac history and age and multiple events patient will need observation in the hospital and cardiac monitoring.  Blood work ordered and reviewed mild low potassium 3.1, creatinine 1.29.  EKG overall similar to previous with nonspecific T wave flattening. Currently no complaints from patient.  Paged hospitalist for telemetry. PO K given.  The patients results and plan were reviewed and discussed.   Any x-rays performed  were independently reviewed by myself.   Differential diagnosis were considered with the presenting HPI.  Medications  acetaminophen (TYLENOL) tablet 650 mg (has no administration in time range)  allopurinol (ZYLOPRIM) tablet 300 mg (has no administration in time range)  traMADol (ULTRAM) tablet 50 mg (has no administration in time range)  megestrol (MEGACE) 400 MG/10ML suspension 400 mg (has no administration in time range)  amLODipine (NORVASC) tablet 10 mg (has no administration in time range)  atorvastatin (LIPITOR) tablet 20 mg (has no administration in time range)  QUEtiapine (SEROQUEL) tablet 25 mg (25 mg Oral Given 09/19/19 2349)  pantoprazole (PROTONIX) EC tablet 40 mg (has no administration in time range)  senna-docusate (Senokot-S) tablet 1 tablet (1 tablet Oral Refused 09/19/19 2349)  sucralfate (CARAFATE) tablet 1 g (1 g Oral Given 09/19/19 2349)  ferrous sulfate tablet 325 mg (has no administration in time range)  folic acid (FOLVITE) tablet 1 mg (has no administration in time range)  vitamin B-12 (CYANOCOBALAMIN) tablet 1,000 mcg (has no administration in time range)  magnesium oxide (MAG-OX) tablet 200 mg (200 mg Oral Given 09/19/19 2349)  potassium chloride SA (K-DUR) CR tablet 20 mEq (20 mEq Oral Given 09/19/19 2349)  albuterol (PROVENTIL) (2.5 MG/3ML) 0.083% nebulizer solution 3 mL (has no administration in time range)  mometasone-formoterol (DULERA) 200-5 MCG/ACT inhaler 2 puff (has no administration in time range)  sodium chloride flush (NS) 0.9 % injection 3 mL (3 mLs Intravenous Given 09/19/19 2318)  enoxaparin (LOVENOX) injection 40 mg (has no administration in time range)  0.45 % sodium chloride infusion (has no administration in time range)  sodium chloride flush (NS) 0.9 % injection 3 mL (3 mLs Intravenous Given 09/19/19 2133)  potassium chloride SA (K-DUR) CR tablet 40 mEq (40 mEq Oral Given 09/19/19 2203)  sodium chloride 0.9 % bolus 500 mL (0 mLs Intravenous Stopped  09/19/19 2349)    Vitals:   09/19/19 2130 09/19/19 2145 09/19/19 2200 09/19/19 2215  BP: 137/79 (!) 121/98 (!) 135/108 (!) 134/119  Pulse: 98 96 98 96  Resp: 20 19 (!) 24 (!) 25  Temp:      TempSrc:      SpO2: 100% 99% (!) 81% 100%    Final diagnoses:  Syncope and collapse  Hypokalemia  Hyponatremia    Admission/ observation were discussed with the admitting physician, patient and/or family and they are comfortable with the plan.    Final Clinical Impressions(s) / ED Diagnoses   Final diagnoses:  Syncope and collapse  Hypokalemia  Hyponatremia    ED Discharge Orders    None       Elnora Morrison, MD 09/19/19 2350

## 2019-09-19 NOTE — ED Triage Notes (Signed)
From home bib [facility] due to multiple syncopal episodes within the past week. Per family 2 witnessed episodes one occurring yesterday and the other earlier in the week. However this evening the patient had another syncopal episode where jerking motion was noticed for at least 15 secs. When patient regained consciousness he was alert and oriented x4 no complaints.

## 2019-09-20 ENCOUNTER — Inpatient Hospital Stay (HOSPITAL_COMMUNITY): Payer: Medicare Other

## 2019-09-20 DIAGNOSIS — N183 Chronic kidney disease, stage 3 (moderate): Secondary | ICD-10-CM

## 2019-09-20 DIAGNOSIS — R55 Syncope and collapse: Secondary | ICD-10-CM

## 2019-09-20 DIAGNOSIS — I10 Essential (primary) hypertension: Secondary | ICD-10-CM

## 2019-09-20 LAB — URINALYSIS, ROUTINE W REFLEX MICROSCOPIC
Bilirubin Urine: NEGATIVE
Glucose, UA: NEGATIVE mg/dL
Hgb urine dipstick: NEGATIVE
Ketones, ur: NEGATIVE mg/dL
Leukocytes,Ua: NEGATIVE
Nitrite: NEGATIVE
Protein, ur: NEGATIVE mg/dL
Specific Gravity, Urine: 1.019 (ref 1.005–1.030)
pH: 5 (ref 5.0–8.0)

## 2019-09-20 LAB — BASIC METABOLIC PANEL
Anion gap: 11 (ref 5–15)
BUN: 14 mg/dL (ref 8–23)
CO2: 19 mmol/L — ABNORMAL LOW (ref 22–32)
Calcium: 8.9 mg/dL (ref 8.9–10.3)
Chloride: 106 mmol/L (ref 98–111)
Creatinine, Ser: 1.17 mg/dL (ref 0.61–1.24)
GFR calc Af Amer: >60 mL/min (ref >60)
GFR calc non Af Amer: >60 mL/min (ref >60)
Glucose, Bld: 103 mg/dL — ABNORMAL HIGH (ref 70–99)
Potassium: 3.6 mmol/L (ref 3.5–5.1)
Sodium: 136 mmol/L (ref 135–145)

## 2019-09-20 LAB — BRAIN NATRIURETIC PEPTIDE: B Natriuretic Peptide: 38.9 pg/mL (ref 0.0–100.0)

## 2019-09-20 LAB — CBC
HCT: 30.9 % — ABNORMAL LOW (ref 39.0–52.0)
Hemoglobin: 9.7 g/dL — ABNORMAL LOW (ref 13.0–17.0)
MCH: 30.6 pg (ref 26.0–34.0)
MCHC: 31.4 g/dL (ref 30.0–36.0)
MCV: 97.5 fL (ref 80.0–100.0)
Platelets: 364 10*3/uL (ref 150–400)
RBC: 3.17 MIL/uL — ABNORMAL LOW (ref 4.22–5.81)
RDW: 16.2 % — ABNORMAL HIGH (ref 11.5–15.5)
WBC: 6.9 10*3/uL (ref 4.0–10.5)
nRBC: 0 % (ref 0.0–0.2)

## 2019-09-20 LAB — ECHOCARDIOGRAM COMPLETE

## 2019-09-20 LAB — CBG MONITORING, ED
Glucose-Capillary: 80 mg/dL (ref 70–99)
Glucose-Capillary: 84 mg/dL (ref 70–99)

## 2019-09-20 LAB — SARS CORONAVIRUS 2 (TAT 6-24 HRS): SARS Coronavirus 2: NEGATIVE

## 2019-09-20 MED ORDER — SODIUM CHLORIDE 0.9 % IV SOLN
INTRAVENOUS | Status: DC
  Administered 2019-09-20: 1000 mL via INTRAVENOUS
  Administered 2019-09-20 – 2019-09-22 (×3): via INTRAVENOUS

## 2019-09-20 MED ORDER — QUETIAPINE FUMARATE 25 MG PO TABS
25.0000 mg | ORAL_TABLET | Freq: Every day | ORAL | Status: DC
  Administered 2019-09-21 – 2019-09-23 (×3): 25 mg via ORAL
  Filled 2019-09-20 (×3): qty 1

## 2019-09-20 MED ORDER — MIRTAZAPINE 15 MG PO TABS
7.5000 mg | ORAL_TABLET | Freq: Every day | ORAL | Status: DC
  Administered 2019-09-20 – 2019-09-21 (×2): 7.5 mg via ORAL
  Filled 2019-09-20 (×2): qty 1

## 2019-09-20 NOTE — ED Notes (Signed)
Pt refused othostatic vitals at this time. Nurse notifed

## 2019-09-20 NOTE — ED Notes (Addendum)
Pt refused breakfast, offered an alternative, pt states he just want to sleep. Nurse notified

## 2019-09-20 NOTE — Progress Notes (Signed)
  Echocardiogram 2D Echocardiogram has been performed.  [name] 09/20/2019, 2:05 PM

## 2019-09-20 NOTE — Progress Notes (Signed)
Arrived from ED at 2100. Alert and oriented to person and place, not time and situation.  Denies any pain. Call light within reach.

## 2019-09-20 NOTE — ED Notes (Signed)
Pt in bed, informed daughter call for update. Pt stated he is not hungry but will take some milk / snacks if available.

## 2019-09-20 NOTE — Progress Notes (Addendum)
PROGRESS NOTE    John [name]  SLH:[id number] DOB: [birth year] DOA: 09/19/2019 PCP: [name], NP (Inactive)   Brief Narrative: As per H&P: 73 y.o. male with medical history significant of multiple recent admissions admitted for orthostatic hypotension and evaluation of syncope . He had hospitalization in early September for upper GI bleed with a duodenal ulcer. Patient's had acute kidney injury that was prerenal secondary to dehydration.  Patient has completed a course of stereotactic radiation therapy for recurrent non-small cell cancer of the lung.  He has tolerated his treatments fairly well but his family reports he has intermittent problems with imbalance.  Per the family the patient has had 3 episodes of syncope in the last week.Last episode was the day of admission where by his daughter's report he had episode of syncope lasting approximately 15 minutes.  She was holding him during this period and noted that his eyes were rolled back, he had a snoring type of breathing pattern and had myoclonic movement.  EMS was called.He regained consciousness about the time of their arrival.  His daughter reports he remained somewhat confused at the time he was transported.He has multiple medical problems including diabetes hypertension hyperlipidemia and dementia.  He was brought to the [facility] emergency department for further evaluation  In ER ,hemodynamically stable ,fully awake. Lab revealed a mild anemia, mild hypokalemia.  IV fluids were initiated in the emergency department.  [facility] was referred to [facility] for admission and evaluation for syncope and possible seizure.  Subjective: Patient is alert awake this morning, with NG tube name, place, not to current president.  Complains of pain in his right knee and ankle, denies chest pain fever chills shortness of breath, focal weakness.  Assessment & Plan:  Syncope: Prior history of recurrent syncope now concern for possible  seizure with witnessed myoclonic movement eye rolling.  No incontinence or definitive postictal symptoms.  Patient has been orthostatic.Asked nursing staff to obtain orthostatic vitals.  Follow-up EEG, echo cardiogram.  Monitoring telemetry.  Obtained neurology consultation-discussed with Dr Cheral Marker- EEG shows generalized slowing  but no seizure- feels most likely is due to dementia. ?encephalopathy.Chart review shows that patient has had acute metabolic encephalopathy in the setting of dementia back in August.  Continue to monitor closely, frequent neuro check obtain PT OT evaluation.  Check VBG to make sure is not retaining carbon oxide.Continues on Megace, and Seroquel BID- change to qhs.  History of orthostatic hypotension.  On previous admission beta-blocker clonidine and diuretics were discontinued.  Monitor orthostatic vitals here.  Hypokalemia: Improved.  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  Diabetes mellitus:Recent hemoglobin A1c 4.6.  Has poor appetite so oral meds were discontinued on last discharge on 9/2.  Monitor blood sugar.  LXB:WIOMBT.  On amlodipine 10 mg.  Monitor orthostatic vitals and monitor back up on amlodipine.  Primary cancer of right lower lobe of lung, has completed SBRT for recurrent non-small cell lung cancer, outpatient follow-up with oncology.  CKD stage III: Creatinine improved to 1.17.  Monitor  Anemia of chronic disease/recent duodenal ulcer with upper GI bleed.  Continue PPI, monitor hemoglobin.  Arthritis/knee pain, x-ray of the left knee shows " Severe tricompartmental arthrosis, with near total medial compartment joint space loss", secondary to arthritis continue Tylenol pain control.  Metabolic acidosis- bicarb at 19, likely from poor po intake/dehydration- family reproted he has not been eating and drinking well. Check vbg  DVT prophylaxis:Lovenox Code Status:Full Family Communication:Plan of care  discussed with  patient in detail. Will update family Disposition Plan:Remains inpatient pending clinical improvement.  Addendum:I was able to get hold of granddaughter,daughter did not pick up the phone. As per granddaughter for past days/he has not been doing much at home,not eating well appears to be depressed.He has been having syncopal episodes and being weak. Added Remeron nightly, continue his Megace. Consulted dietitian. Per RN refused Orthostatics- asked RN to attempt again. He had retained urine- 650 ml in bladder scan-voided abt 250 ml, UA obtained and negative.  Echo overall stable-Normal systolic function, has impaired relaxation but no significant valve issues. Orthostatics was able to be obtained finally as patient had refused earlier.  His blood pressure on lying was 858 systolic and dropped to 850 on standing: Given orthostatic hypotension we will continue on aggressive IV fluid hydration and repeat vitals. Keep the patient inpatient at this time for ongoing IV fluid hydration given his orthostatic hypotension, given that patient is lethargic and has unreliable oral intake.     Consultants: Neurology curbside- but not improving neuro will see the patient formally.    Procedures:  EEG: This study is suggestive of mild to moderate diffuse encephalopathy, nonspecific to etiology. No seizures or epileptiform discharges were seen throughout the recording.  TTE : Left ventricular ejection fraction, by visual estimation, is 60 to 65%. The left ventricle has normal function. Normal left ventricular size. There is no left ventricular hypertrophy.  2. Left ventricular diastolic Doppler parameters are consistent with impaired relaxation pattern of LV diastolic filling.  3. Global right ventricle has normal systolic function.The right ventricular size is normal. No increase in right ventricular wall thickness.  4. Left atrial size was normal.  5. Right atrial size was normal.  6. The mitral valve is normal  in structure. No evidence of mitral valve regurgitation. No evidence of mitral stenosis.  7. The tricuspid valve is normal in structure. Tricuspid valve regurgitation was not visualized by color flow Doppler.  8. The aortic valve is normal in structure. Aortic valve regurgitation was not visualized by color flow Doppler. Structurally normal aortic valve, with no evidence of sclerosis or stenosis.  9. The pulmonic valve was normal in structure. Pulmonic valve regurgitation is trivial by color flow Doppler. 10. The inferior vena cava is normal in size with greater than 50% respiratory variability, suggesting right atrial pressure of 3 mmHg.  Microbiology: COVID 19 negative  Antimicrobials: Anti-infectives (From admission, onward)   None       Objective: Vitals:   09/20/19 0200 09/20/19 0300 09/20/19 0400 09/20/19 0700  BP: (!) 157/77 130/73 (!) 150/82 (!) 158/85  Pulse: 82   92  Resp: 20 (!) 23 19 16   Temp:      TempSrc:      SpO2: 100%   98%    Intake/Output Summary (Last 24 hours) at 09/20/2019 1309 Last data filed at 09/20/2019 1207 Gross per 24 hour  Intake 1000 ml  Output -  Net 1000 ml   There were no vitals filed for this visit. Weight change:   There is no height or weight on file to calculate BMI.  Intake/Output from previous day: No intake/output data recorded. Intake/Output this shift: Total I/O In: 1000 [I.V.:1000] Out: -   Examination:  General exam: Appears calm and comfortable, clearly frail, chronically sick looking somewhat lethargic, HEENT:PERRL,Oral mucosa moist, Ear/Nose normal on gross exam Respiratory system: Bilateral equal air entry, normal vesicular breath sounds, no wheezes or crackles  Cardiovascular system: S1 &  S2 heard,No JVD, murmurs. Gastrointestinal system: Abdomen is  soft, non tender, non distended, BS +  Nervous System:Alert and orientedx2.No focal neurological deficits/moving extremities, sensation intact. Extremities: No edema, no  clubbing, distal peripheral pulses palpable. Skin: No rashes, lesions, no icterus MSK: Normal muscle bulk,tone ,power  Medications:  Scheduled Meds: . allopurinol  300 mg Oral Daily  . amLODipine  10 mg Oral Daily  . atorvastatin  20 mg Oral Daily  . enoxaparin (LOVENOX) injection  40 mg Subcutaneous Q24H  . ferrous sulfate  325 mg Oral Q M,W,F  . folic acid  1 mg Oral Daily  . magnesium oxide  200 mg Oral BID  . megestrol  400 mg Oral Daily  . mometasone-formoterol  2 puff Inhalation BID  . pantoprazole  40 mg Oral BID AC  . potassium chloride SA  20 mEq Oral BID  . QUEtiapine  25 mg Oral BID  . senna-docusate  1 tablet Oral QHS  . sodium chloride flush  3 mL Intravenous Q12H  . sucralfate  1 g Oral QID  . cyanocobalamin  1,000 mcg Oral Daily   Continuous Infusions:  Data Reviewed: I have personally reviewed following labs and imaging studies  CBC: Recent Labs  Lab 09/17/19 1514 09/19/19 2056 09/20/19 0333  WBC 9.5 6.8 6.9  NEUTROABS 8.3*  --   --   HGB 9.9* 10.2* 9.7*  HCT 30.8* 32.4* 30.9*  MCV 96.3 98.5 97.5  PLT 383 440* 782   Basic Metabolic Panel: Recent Labs  Lab 09/17/19 1514 09/19/19 2056 09/20/19 0333  NA 141 133* 136  K 3.0* 3.1* 3.6  CL 107 101 106  CO2 23 19* 19*  GLUCOSE 123* 138* 103*  BUN 15 13 14   CREATININE 1.43* 1.29* 1.17  CALCIUM 9.3 8.9 8.9   GFR: Estimated Creatinine Clearance: 59.2 mL/min (by C-G formula based on SCr of 1.17 mg/dL). Liver Function Tests: No results for input(s): AST, ALT, ALKPHOS, BILITOT, PROT, ALBUMIN in the last 168 hours. No results for input(s): LIPASE, AMYLASE in the last 168 hours. No results for input(s): AMMONIA in the last 168 hours. Coagulation Profile: No results for input(s): INR, PROTIME in the last 168 hours. Cardiac Enzymes: No results for input(s): CKTOTAL, CKMB, CKMBINDEX, TROPONINI in the last 168 hours. BNP (last 3 results) No results for input(s): PROBNP in the last 8760 hours. HbA1C:  No results for input(s): HGBA1C in the last 72 hours. CBG: Recent Labs  Lab 09/19/19 2214 09/20/19 1027 09/20/19 1200  GLUCAP 120* 80 84   Lipid Profile: No results for input(s): CHOL, HDL, LDLCALC, TRIG, CHOLHDL, LDLDIRECT in the last 72 hours. Thyroid Function Tests: No results for input(s): TSH, T4TOTAL, FREET4, T3FREE, THYROIDAB in the last 72 hours. Anemia Panel: No results for input(s): VITAMINB12, FOLATE, FERRITIN, TIBC, IRON, RETICCTPCT in the last 72 hours. Sepsis Labs: No results for input(s): PROCALCITON, LATICACIDVEN in the last 168 hours.  Recent Results (from the past 240 hour(s))  SARS CORONAVIRUS 2 (TAT 6-24 HRS) Nasopharyngeal Nasopharyngeal Swab     Status: None   Collection Time: 09/19/19 10:05 PM   Specimen: Nasopharyngeal Swab  Result Value Ref Range Status   SARS Coronavirus 2 NEGATIVE NEGATIVE Final    Comment: (NOTE) SARS-CoV-2 target nucleic acids are NOT DETECTED. The SARS-CoV-2 RNA is generally detectable in upper and lower respiratory specimens during the acute phase of infection. Negative results do not preclude SARS-CoV-2 infection, do not rule out co-infections with other pathogens, and should not be used  as the sole basis for treatment or other patient management decisions. Negative results must be combined with clinical observations, patient history, and epidemiological information. The expected result is Negative. Fact Sheet for Patients: SugarRoll.be Fact Sheet for Healthcare Providers: https://www.woods-mathews.com/ This test is not yet approved or cleared by the [place] FDA and  has been authorized for detection and/or diagnosis of SARS-CoV-2 by FDA under an Emergency Use Authorization (EUA). This EUA will remain  in effect (meaning this test can be used) for the duration of the COVID-19 declaration under Section 56 4(b)(1) of the Act, 21 U.S.C. section 360bbb-3(b)(1), unless the  authorization is terminated or revoked sooner. Performed at [facility], [place] [address], [place], [place] [address]       Radiology Studies: No results found.    LOS: 1 day   Time spent: More than 50% of that time was spent in counseling and/or coordination of care.  Antonieta Pert, MD [facility]  09/20/2019, 1:09 PM

## 2019-09-20 NOTE — ED Notes (Signed)
Daughter John [name] is updated. Phone provided to pt to speak w/ family.

## 2019-09-20 NOTE — ED Notes (Signed)
Tele

## 2019-09-20 NOTE — ED Notes (Signed)
EEG in progress with tech at bedside.

## 2019-09-20 NOTE — ED Notes (Signed)
Breakfast Ordered 

## 2019-09-20 NOTE — ED Notes (Signed)
Lunch ordered 

## 2019-09-20 NOTE — Progress Notes (Signed)
EEG complete - results pending 

## 2019-09-20 NOTE — ED Notes (Signed)
CBG 84

## 2019-09-20 NOTE — ED Notes (Signed)
Floor unable to take report at this time.

## 2019-09-20 NOTE — Procedures (Signed)
Patient Name: John [name]  MRN: [id number]  Epilepsy Attending: Lora Havens  Referring Physician/Provider: Dr Darol Destine Date: 09/20/2019 Duration: 26.53 mins  Patient history: 73 year old man with syncope.  EEG to evaluate for seizures.  Level of alertness: Awake  AEDs during EEG study: None  Technical aspects: This EEG study was done with scalp electrodes positioned according to the 10-20 International system of electrode placement. Electrical activity was acquired at a sampling rate of 500Hz  and reviewed with a high frequency filter of 70Hz  and a low frequency filter of 1Hz . EEG data were recorded continuously and digitally stored.   Description:   During awake state, continuous generalized slowing.  No clear posterior dominant rhythm was seen.  Sleep was characterized by vertex waves and spindles (12 to 14 Hz), maximal frontocentral. Hyperventilation and photic stimulation were not performed.  IMPRESSION: This study is suggestive of mild to moderate diffuse encephalopathy, nonspecific to etiology. No seizures or epileptiform discharges were seen throughout the recording.  [name] [name]

## 2019-09-21 ENCOUNTER — Encounter (HOSPITAL_COMMUNITY): Payer: Self-pay

## 2019-09-21 LAB — CBC
HCT: 27.7 % — ABNORMAL LOW (ref 39.0–52.0)
Hemoglobin: 8.9 g/dL — ABNORMAL LOW (ref 13.0–17.0)
MCH: 30.9 pg (ref 26.0–34.0)
MCHC: 32.1 g/dL (ref 30.0–36.0)
MCV: 96.2 fL (ref 80.0–100.0)
Platelets: 350 10*3/uL (ref 150–400)
RBC: 2.88 MIL/uL — ABNORMAL LOW (ref 4.22–5.81)
RDW: 16.4 % — ABNORMAL HIGH (ref 11.5–15.5)
WBC: 8.3 10*3/uL (ref 4.0–10.5)
nRBC: 0 % (ref 0.0–0.2)

## 2019-09-21 LAB — BASIC METABOLIC PANEL
Anion gap: 10 (ref 5–15)
BUN: 9 mg/dL (ref 8–23)
CO2: 20 mmol/L — ABNORMAL LOW (ref 22–32)
Calcium: 8.9 mg/dL (ref 8.9–10.3)
Chloride: 110 mmol/L (ref 98–111)
Creatinine, Ser: 1.06 mg/dL (ref 0.61–1.24)
GFR calc Af Amer: >60 mL/min (ref >60)
GFR calc non Af Amer: >60 mL/min (ref >60)
Glucose, Bld: 94 mg/dL (ref 70–99)
Potassium: 3.5 mmol/L (ref 3.5–5.1)
Sodium: 140 mmol/L (ref 135–145)

## 2019-09-21 LAB — GLUCOSE, CAPILLARY: Glucose-Capillary: 98 mg/dL (ref 70–99)

## 2019-09-21 MED ORDER — AMLODIPINE BESYLATE 5 MG PO TABS
5.0000 mg | ORAL_TABLET | Freq: Every day | ORAL | Status: DC
  Administered 2019-09-22 – 2019-09-23 (×2): 5 mg via ORAL
  Filled 2019-09-21 (×2): qty 1

## 2019-09-21 MED ORDER — ENSURE ENLIVE PO LIQD
237.0000 mL | Freq: Three times a day (TID) | ORAL | Status: DC
  Administered 2019-09-21 – 2019-09-24 (×6): 237 mL via ORAL

## 2019-09-21 NOTE — Progress Notes (Signed)
PROGRESS NOTE    John [name]  SEG:[id number] DOB: 10-24-1946 DOA: 09/19/2019 PCP: [name], NP (Inactive)   Brief Narrative: As per H&P: 73 y.o. male with medical history significant of multiple recent admissions admitted for orthostatic hypotension and evaluation of syncope . He had hospitalization in early September for upper GI bleed with a duodenal ulcer. Patient's had acute kidney injury that was prerenal secondary to dehydration.  Patient has completed a course of stereotactic radiation therapy for recurrent non-small cell cancer of the lung.  He has tolerated his treatments fairly well but his family reports he has intermittent problems with imbalance.  Per the family the patient has had 3 episodes of syncope in the last week.Last episode was the day of admission where by his daughter's report he had episode of syncope lasting approximately 15 minutes.  She was holding him during this period and noted that his eyes were rolled back, he had a snoring type of breathing pattern and had myoclonic movement.  EMS was called.He regained consciousness about the time of their arrival.  His daughter reports he remained somewhat confused at the time he was transported.He has multiple medical problems including diabetes hypertension hyperlipidemia and dementia.  He was brought to the [facility] emergency department for further evaluation  In ER ,hemodynamically stable ,fully awake. Lab revealed a mild anemia, mild hypokalemia.  IV fluids were initiated in the emergency department.  [facility] was referred to [facility] for admission and evaluation for syncope and possible seizure.  Subjective: Seen and examined this morning he appears much more alert and awake with dementia able to provide some history.   Orthostatics was being done and still positive this morning dropped from 737 systolic lying to 98 systolic standing.  Assessment & Plan:  Syncope: Prior history of recurrent syncope  and  Had witnessed myoclonic movement eye rolling Presyncopal episode-family concerned about seizure. No incontinence or definitive postictal symptoms. EEG obtained and showed to moderate diffuse encephalopathy. echo unremarkable.  Discussed with neurologist Dr Cheral Marker 9/21- since EEG no seizures doubt he has a seizure, likely cephalopathy in the setting of dementia. Chart review shows that patient has had acute metabolic encephalopathy in the setting of dementia back in August.  Patient more alert and awake today but orthostatic vitals still positive, ordered lower extremity stocking-decrease amlodipine from 10 to 5 mg  And keep him on IV fluids hydration, monitor  OV. Continue PT OT.   Acute metabolic encephalopathy in the setting of dementia, likely from medication/progression of dementia :Poor oral intake has been very sleepy lethargic for several days.  Stopped morning Seroquel.  Alert today.  Increase ambulation.  History of orthostatic hypotension.  On previous admission beta-blocker clonidine and diuretics were discontinued.  Monitor orthostatic vitals here-see #1.  Hypokalemia: Improved.  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  Watch cautiously while we are hydrating with IV fluids  Diabetes mellitus type TG:GYIRSW hemoglobin A1c 4.6.  Has poor appetite so oral meds were discontinued on last discharge on 9/2.  Monitor blood sugar.  NIO:EVOJJK.On amlodipine 10 mg-decrease to 5 mg due to orthostatic hypotension.Monitor orthostatic vitals.  Primary cancer of right lower lobe of lung, has completed SBRT for recurrent non-small cell lung cancer, outpatient follow-up with oncology.  CKD stage KXF:GHWEXHBZJI improved to 1.17.  Monitor  Anemia of chronic disease/recent duodenal ulcer with upper GI bleed.  Continue PPI, monitor hemoglobin.  Arthritis/knee pain, x-ray of the left knee shows " Severe tricompartmental arthrosis,  with near total medial compartment  joint space loss", secondary to arthritis continue Tylenol pain control.  Metabolic acidosis- bicarb up at 20, likely from poor po intake/dehydration- family reproted he has not been eating and drinking well. Cont ivf  Poor oral intake with lethargy excessive sleepiness daytime: DC'd a.m. Seroquel.Remeron added. Dietitian consulted.  Increase oral intake.  Continue his Megace.  DVT prophylaxis:Lovenox Code Status:Full Family Communication:Plan of care discussed with patient in detail.  I had called and updated granddaughter 9/21.Will try to call family again today. I called and discussed with patient's daughter about plan of care- if he continues to improve she is okay with him coming home tomorrow with home health they will provide 24/7 care.  Disposition: Remains inpatient pending improvement in the orthostatic vitals-cont on IV fluid hydration and  we are adjusting medication and therapy for him.  Consultants: Neurology curbside- but not improving neuro will see the patient formally.    Procedures:  ACZ:YSAY study is suggestive of mild to moderate diffuse encephalopathy, nonspecific to etiology. No seizures or epileptiform discharges were seen throughout the recording.  TKZ:SWFU ventricular ejection fraction, by visual estimation, is 60 to 65%. The left ventricle has normal function. Normal left ventricular size. There is no left ventricular hypertrophy.  2. Left ventricular diastolic Doppler parameters are consistent with impaired relaxation pattern of LV diastolic filling.  3. Global right ventricle has normal systolic function.The right ventricular size is normal. No increase in right ventricular wall thickness.  4. Left atrial size was normal.  5. Right atrial size was normal.  6. The mitral valve is normal in structure. No evidence of mitral valve regurgitation. No evidence of mitral stenosis.  7. The tricuspid valve is normal in structure. Tricuspid valve regurgitation was not  visualized by color flow Doppler.  8. The aortic valve is normal in structure. Aortic valve regurgitation was not visualized by color flow Doppler. Structurally normal aortic valve, with no evidence of sclerosis or stenosis.  9. The pulmonic valve was normal in structure. Pulmonic valve regurgitation is trivial by color flow Doppler. 10. The inferior vena cava is normal in size with greater than 50% respiratory variability, suggesting right atrial pressure of 3 mmHg.  Microbiology: COVID 19 negative  Antimicrobials: Anti-infectives (From admission, onward)   None     Objective: Vitals:   09/21/19 0845 09/21/19 0849 09/21/19 0851 09/21/19 0855  BP: 140/81 128/76 (!) 98/58 95/61  Pulse: 86 92 (!) 108 99  Resp: 18     Temp: 97.9 F (36.6 C)     TempSrc: Oral     SpO2: 100% 100% 100%   Weight:      Height:        Intake/Output Summary (Last 24 hours) at 09/21/2019 1143 Last data filed at 09/21/2019 9323 Gross per 24 hour  Intake 1706 ml  Output 600 ml  Net 1106 ml   Filed Weights   09/20/19 2055  Weight: 68.4 kg   Weight change:   Body mass index is 22.93 kg/m.  Intake/Output from previous day: 09/21 0701 - 09/22 0700 In: 1706 [I.V.:1706] Out: 600 [Urine:600] Intake/Output this shift: No intake/output data recorded.  Examination:  General exam: Appears more alert awake oriented to self, place first week and.  Frail.   HEENT:PERRL,Oral mucosa moist, Ear/Nose normal on gross exam Respiratory system: Bilateral clear lung sounds, no wheezing or crackles.   Cardiovascular system: S1 & S2 heard,No JVD, murmurs. Gastrointestinal system: Abdomen is  soft, non tender, non distended, BS +  Nervous System:Alert and oriented to self, place, date of birth current president .No focal neurological deficits/moving extremities, sensation intact. Extremities: No edema, no clubbing, distal peripheral pulses palpable. Skin: No rashes, lesions, no icterus MSK: Normal muscle bulk,tone  ,power  Medications:  Scheduled Meds: . allopurinol  300 mg Oral Daily  . amLODipine  10 mg Oral Daily  . atorvastatin  20 mg Oral Daily  . enoxaparin (LOVENOX) injection  40 mg Subcutaneous Q24H  . ferrous sulfate  325 mg Oral Q M,W,F  . folic acid  1 mg Oral Daily  . magnesium oxide  200 mg Oral BID  . megestrol  400 mg Oral Daily  . mirtazapine  7.5 mg Oral QHS  . mometasone-formoterol  2 puff Inhalation BID  . pantoprazole  40 mg Oral BID AC  . potassium chloride SA  20 mEq Oral BID  . QUEtiapine  25 mg Oral QHS  . senna-docusate  1 tablet Oral QHS  . sodium chloride flush  3 mL Intravenous Q12H  . sucralfate  1 g Oral QID  . cyanocobalamin  1,000 mcg Oral Daily   Continuous Infusions: . sodium chloride 100 mL/hr at 09/21/19 0730    Data Reviewed: I have personally reviewed following labs and imaging studies  CBC: Recent Labs  Lab 09/17/19 1514 09/19/19 2056 09/20/19 0333 09/21/19 0413  WBC 9.5 6.8 6.9 8.3  NEUTROABS 8.3*  --   --   --   HGB 9.9* 10.2* 9.7* 8.9*  HCT 30.8* 32.4* 30.9* 27.7*  MCV 96.3 98.5 97.5 96.2  PLT 383 440* 364 229   Basic Metabolic Panel: Recent Labs  Lab 09/17/19 1514 09/19/19 2056 09/20/19 0333 09/21/19 0413  NA 141 133* 136 140  K 3.0* 3.1* 3.6 3.5  CL 107 101 106 110  CO2 23 19* 19* 20*  GLUCOSE 123* 138* 103* 94  BUN 15 13 14 9   CREATININE 1.43* 1.29* 1.17 1.06  CALCIUM 9.3 8.9 8.9 8.9   GFR: Estimated Creatinine Clearance: 59.2 mL/min (by C-G formula based on SCr of 1.06 mg/dL). Liver Function Tests: No results for input(s): AST, ALT, ALKPHOS, BILITOT, PROT, ALBUMIN in the last 168 hours. No results for input(s): LIPASE, AMYLASE in the last 168 hours. No results for input(s): AMMONIA in the last 168 hours. Coagulation Profile: No results for input(s): INR, PROTIME in the last 168 hours. Cardiac Enzymes: No results for input(s): CKTOTAL, CKMB, CKMBINDEX, TROPONINI in the last 168 hours. BNP (last 3 results) No  results for input(s): PROBNP in the last 8760 hours. HbA1C: No results for input(s): HGBA1C in the last 72 hours. CBG: Recent Labs  Lab 09/19/19 2214 09/20/19 1027 09/20/19 1200 09/21/19 0646  GLUCAP 120* 80 84 98   Lipid Profile: No results for input(s): CHOL, HDL, LDLCALC, TRIG, CHOLHDL, LDLDIRECT in the last 72 hours. Thyroid Function Tests: No results for input(s): TSH, T4TOTAL, FREET4, T3FREE, THYROIDAB in the last 72 hours. Anemia Panel: No results for input(s): VITAMINB12, FOLATE, FERRITIN, TIBC, IRON, RETICCTPCT in the last 72 hours. Sepsis Labs: No results for input(s): PROCALCITON, LATICACIDVEN in the last 168 hours.  Recent Results (from the past 240 hour(s))  SARS CORONAVIRUS 2 (TAT 6-24 HRS) Nasopharyngeal Nasopharyngeal Swab     Status: None   Collection Time: 09/19/19 10:05 PM   Specimen: Nasopharyngeal Swab  Result Value Ref Range Status   SARS Coronavirus 2 NEGATIVE NEGATIVE Final    Comment: (NOTE) SARS-CoV-2 target nucleic acids are NOT DETECTED. The SARS-CoV-2 RNA is generally  detectable in upper and lower respiratory specimens during the acute phase of infection. Negative results do not preclude SARS-CoV-2 infection, do not rule out co-infections with other pathogens, and should not be used as the sole basis for treatment or other patient management decisions. Negative results must be combined with clinical observations, patient history, and epidemiological information. The expected result is Negative. Fact Sheet for Patients: SugarRoll.be Fact Sheet for Healthcare Providers: https://www.woods-mathews.com/ This test is not yet approved or cleared by the [place] FDA and  has been authorized for detection and/or diagnosis of SARS-CoV-2 by FDA under an Emergency Use Authorization (EUA). This EUA will remain  in effect (meaning this test can be used) for the duration of the COVID-19 declaration under Section  56 4(b)(1) of the Act, 21 U.S.C. section 360bbb-3(b)(1), unless the authorization is terminated or revoked sooner. Performed at [facility], [place] [address], [place], [place] [address]       Radiology Studies: No results found.    LOS: 2 days   Time spent: More than 50% of that time was spent in counseling and/or coordination of care.  Antonieta Pert, MD [facility]  09/21/2019, 11:43 AM

## 2019-09-21 NOTE — Evaluation (Signed)
Physical Therapy Evaluation Patient Details Name: John [name] MRN: [id number] DOB: Oct 18, 1946 Today's Date: 09/21/2019   History of Present Illness  RAMAR NOBREGA is a 73 y.o. male admitted with possible seizure, history of recurrent non-small cell lung cancer received chemotherapy in the first week of August last month, diastolic CHF COPD diabetes mellitus, gout admitted with acute renal failure  Clinical Impression  Patient received in bed sleeping, easily roused, pleasant. Agrees to PT evaluation. Patient reporting left knee pain and swelling. No dizziness reported. Patient completes bed mobility with mod I, use of bed rails and increased time. He requires min guard for sit to stand transfer and ambulated 100 feet with RW, min guard assist. Occasional standing rest breaks unsure if due to knee pain or fatigue. Patient will benefit from continued skilled PT to address his weakness and to improve activity tolerance.        Follow Up Recommendations Home health PT;Supervision/Assistance - 24 hour    Equipment Recommendations  None recommended by PT    Recommendations for Other Services       Precautions / Restrictions Precautions Precautions: Fall Restrictions Weight Bearing Restrictions: No      Mobility  Bed Mobility Overal bed mobility: Modified Independent Bed Mobility: Supine to Sit           General bed mobility comments: No physical assistance needed, increased time and use of bed rail with HOB elevated  Transfers Overall transfer level: Needs assistance Equipment used: Rolling walker (2 wheeled) Transfers: Sit to/from Stand Sit to Stand: Min guard            Ambulation/Gait Ambulation/Gait assistance: Min guard Gait Distance (Feet): 100 Feet Assistive device: Rolling walker (2 wheeled) Gait Pattern/deviations: Step-through pattern;Narrow base of support;Decreased stride length Gait velocity: decreased   General Gait Details: knee pain with weight  bearing on left  Stairs            Wheelchair Mobility    Modified Rankin (Stroke Patients Only)       Balance Overall balance assessment: Needs assistance Sitting-balance support: No upper extremity supported;Feet supported Sitting balance-Leahy Scale: Good     Standing balance support: Bilateral upper extremity supported Standing balance-Leahy Scale: Fair                               Pertinent Vitals/Pain Pain Assessment: Faces Faces Pain Scale: Hurts even more Pain Location: left knee Pain Descriptors / Indicators: Grimacing;Guarding;Discomfort;Sore Pain Intervention(s): Limited activity within patient's tolerance;Monitored during session    [place] expects to be discharged to:: Private residence Living Arrangements: Children Available Help at Discharge: Family;Available PRN/intermittently Type of Home: House Home Access: Stairs to enter   Entrance Stairs-Number of Steps: 1 Home Layout: One level Home Equipment: Walker - 2 wheels;Cane - single point      Prior Function Level of Independence: Independent with assistive device(s)               Hand Dominance        Extremity/Trunk Assessment   Upper Extremity Assessment Upper Extremity Assessment: Defer to OT evaluation    Lower Extremity Assessment Lower Extremity Assessment: Generalized weakness;LLE deficits/detail LLE Deficits / Details: l knee appears swollen and is tender to touch LLE Sensation: WNL    Cervical / Trunk Assessment Cervical / Trunk Assessment: Normal  Communication   Communication: No difficulties  Cognition Arousal/Alertness: Awake/alert Behavior During Therapy: WFL for tasks assessed/performed  Overall Cognitive Status: Within Functional Limits for tasks assessed Area of Impairment: Memory;Orientation                 Orientation Level: Disoriented to;Place;Situation   Memory: Decreased short-term memory     Awareness:  Emergent   General Comments: h/o vascular dementia      General Comments      Exercises     Assessment/Plan    PT Assessment Patient needs continued PT services  PT Problem List Decreased strength;Decreased safety awareness;Decreased activity tolerance;Decreased balance;Decreased mobility;Pain       PT Treatment Interventions Therapeutic activities;Gait training;Therapeutic exercise;Functional mobility training;Stair training;Balance training;Patient/family education    PT Goals (Current goals can be found in the Care Plan section)  Acute Rehab PT Goals Patient Stated Goal: to decrease knee pain PT Goal Formulation: With patient Time For Goal Achievement: 10/05/19 Potential to Achieve Goals: Fair    Frequency Min 3X/week   Barriers to discharge Decreased caregiver support patient's daughter works so unsure if there would be 24 hour supervision    Co-evaluation               AM-PAC PT "6 Clicks" Mobility  Outcome Measure Help needed turning from your back to your side while in a flat bed without using bedrails?: None Help needed moving from lying on your back to sitting on the side of a flat bed without using bedrails?: A Little Help needed moving to and from a bed to a chair (including a wheelchair)?: A Little Help needed standing up from a chair using your arms (e.g., wheelchair or bedside chair)?: A Little Help needed to walk in hospital room?: A Little Help needed climbing 3-5 steps with a railing? : A Little 6 Click Score: 19    End of Session Equipment Utilized During Treatment: Gait belt Activity Tolerance: Patient tolerated treatment well Patient left: in bed;with call bell/phone within reach Nurse Communication: Mobility status PT Visit Diagnosis: Muscle weakness (generalized) (M62.81);Difficulty in walking, not elsewhere classified (R26.2);Pain Pain - Right/Left: Left Pain - part of body: Knee    Time: 1683-7290 PT Time Calculation (min) (ACUTE  ONLY): 20 min   Charges:   PT Evaluation $PT Eval Moderate Complexity: 1 Mod PT Treatments $Gait Training: 8-22 mins        Graceanna Theissen, PT, GCS 09/21/19,10:15 AM

## 2019-09-21 NOTE — Progress Notes (Signed)
Initial Nutrition Assessment  DOCUMENTATION CODES:   Not applicable  INTERVENTION:  Provide Ensure Enlive po TID, each supplement provides 350 kcal and 20 grams of protein.  Encourage adequate PO intake.  NUTRITION DIAGNOSIS:   Increased nutrient needs related to chronic illness(CHF, cancer) as evidenced by estimated needs.  GOAL:   Patient will meet greater than or equal to 90% of their needs  MONITOR:   PO intake, Supplement acceptance, Skin, Weight trends, Labs, I & O's  REASON FOR ASSESSMENT:   Consult Assessment of nutrition requirement/status  ASSESSMENT:   73 y.o. male admitted with possible seizure, history of recurrent non-small cell lung cancer received chemotherapy in the first week of August last month, diastolic CHF COPD diabetes mellitus, gout admitted with acute renal failure  Pt unavailable during attempted time of contact. RD unable to obtain pt nutrition history at this time. RD to order nutritional supplements to aid in increased caloric and protein needs.  Unable to complete Nutrition-Focused physical exam at this time.   Labs and medications reviewed.   Diet Order:   Diet Order            Diet heart healthy/carb modified Room service appropriate? Yes with Assist; Fluid consistency: Thin  Diet effective now              EDUCATION NEEDS:   Not appropriate for education at this time  Skin:  Skin Assessment: Reviewed RN Assessment  Last BM:  9/20  Height:   Ht Readings from Last 1 Encounters:  09/20/19 5\' 8"  (1.727 m)    Weight:   Wt Readings from Last 1 Encounters:  09/20/19 68.4 kg    Ideal Body Weight:  70 kg  BMI:  Body mass index is 22.93 kg/m.  Estimated Nutritional Needs:   Kcal:  1850-2100  Protein:  90-100 grams  Fluid:  >/= 1.8 L/day    Corrin Parker, MS, RD, LDN Pager # [phone] After hours/ weekend pager # [phone]

## 2019-09-21 NOTE — Plan of Care (Signed)
Patient stable, discussed POC with patient and daughter, agreeable with plan, denies question/concerns at this time.  

## 2019-09-21 NOTE — Evaluation (Signed)
Occupational Therapy Evaluation Patient Details Name: John [name] MRN: [id number] DOB: Apr 13, 1946 Today's Date: 09/21/2019    History of Present Illness John [name] is a 73 y.o. male admitted with possible seizure, history of recurrent non-small cell lung cancer received chemotherapy in the first week of August last month, diastolic CHF COPD diabetes mellitus, gout admitted with acute renal failure   Clinical Impression   Pt admitted with above diagnoses, cognitive deficits, generalized weakness, and L knee pain limiting ability to engage in BADL at desired level of ind. Pt at time poor historian, unsure how often dtr is at home with him and assisting. At time of evaluation, pt is overall min guard-min A for mobility with RW. He presents with the cognitive deficits listed below. In need of tasks being simplified with step by step cues for safe and successful sequencing. Pt needing mod A for LB dressing given knee pain. Given current status, recommend pt needing 24/7 assist with [facility] or SNF pending on what family can provide. Will continue to follow per POC listed below.     Follow Up Recommendations  Supervision/Assistance - 24 hour;Home health OT;SNF;Other (comment)(pt will need 24/7 assist at home, if not needs SNF)    Equipment Recommendations  Other (comment)(TBD)    Recommendations for Other Services       Precautions / Restrictions Precautions Precautions: Fall Restrictions Weight Bearing Restrictions: No      Mobility Bed Mobility   Bed Mobility: Sit to Supine       Sit to supine: Supervision   General bed mobility comments: increased time 2/2 leg pain  Transfers Overall transfer level: Needs assistance Equipment used: Rolling walker (2 wheeled) Transfers: Sit to/from Stand Sit to Stand: Min guard         General transfer comment: min guard for safety    Balance Overall balance assessment: Needs assistance Sitting-balance support: No upper extremity  supported;Feet supported Sitting balance-Leahy Scale: Good     Standing balance support: Bilateral upper extremity supported Standing balance-Leahy Scale: Fair                             ADL either performed or assessed with clinical judgement   ADL Overall ADL's : Needs assistance/impaired Eating/Feeding: Set up;Sitting   Grooming: Min guard;Standing   Upper Body Bathing: Min guard;Sitting   Lower Body Bathing: Moderate assistance;Sit to/from stand;Sitting/lateral leans   Upper Body Dressing : Min guard;Sitting   Lower Body Dressing: Moderate assistance;Sit to/from stand;Sitting/lateral leans   Toilet Transfer: Minimal assistance;BSC;Regular Toilet;RW;Cueing for safety;Cueing for sequencing Toilet Transfer Details (indicate cue type and reason): step by step cueing needed to sequence through activity safely Toileting- Clothing Manipulation and Hygiene: Min guard;Sit to/from stand;Sitting/lateral lean   Tub/ Shower Transfer: Minimal assistance;Shower seat;Ambulation;Rolling walker;3 in 1   Functional mobility during ADLs: Minimal assistance;Cueing for sequencing;Cueing for safety;Rolling walker General ADL Comments: pt ltd 2/2 cognitive deficits, generalized weakness, and pain     Vision Baseline Vision/History: Wears glasses Wears Glasses: Reading only Vision Assessment?: Vision impaired- to be further tested in functional context Additional Comments: question depth perception. Pt thinking part of bed was ball on the floor when walking past     Perception     Praxis      Pertinent Vitals/Pain Pain Assessment: Faces Faces Pain Scale: Hurts little more Pain Location: left knee Pain Descriptors / Indicators: Grimacing;Guarding;Discomfort;Sore Pain Intervention(s): Limited activity within patient's tolerance;Monitored during session;Repositioned  Hand Dominance Right   Extremity/Trunk Assessment Upper Extremity Assessment Upper Extremity Assessment:  Generalized weakness   Lower Extremity Assessment Lower Extremity Assessment: Defer to PT evaluation       Communication Communication Communication: No difficulties   Cognition Arousal/Alertness: Awake/alert Behavior During Therapy: WFL for tasks assessed/performed Overall Cognitive Status: No family/caregiver present to determine baseline cognitive functioning Area of Impairment: Memory;Orientation;Awareness                 Orientation Level: Disoriented to;Situation;Time   Memory: Decreased short-term memory     Awareness: Emergent Problem Solving: Slow processing;Requires verbal cues;Requires tactile cues;Difficulty sequencing General Comments: h/o vascular dementia; confabulates/covers when unsure of answers; tasks need to be broken down into simple steps   General Comments       Exercises     Shoulder Instructions      Home Living Family/patient expects to be discharged to:: Private residence Living Arrangements: Children(dtr stays with him "sometimes") Available Help at Discharge: Family;Available PRN/intermittently Type of Home: House Home Access: Stairs to enter [employer] of Steps: 1   Home Layout: One level     Bathroom Shower/Tub: Occupational psychologist: Standard Bathroom Accessibility: Yes How Accessible: Accessible via walker Home Equipment: [place] - 2 wheels;Cane - single point;Bedside commode          Prior Functioning/Environment Level of Independence: Independent with assistive device(s)        Comments: has not been using RW consistently, but uses BSC. Dtr helps manage IADLs        OT Problem List: Impaired balance (sitting and/or standing);Decreased safety awareness;Decreased knowledge of use of DME or AE;Pain;Decreased strength;Decreased activity tolerance;Decreased cognition      OT Treatment/Interventions: Self-care/ADL training;DME and/or AE instruction;Patient/family education;Balance  training;Therapeutic activities;Cognitive remediation/compensation;Therapeutic exercise;Energy conservation    OT Goals(Current goals can be found in the care plan section) Acute Rehab OT Goals Patient Stated Goal: to decrease knee pain OT Goal Formulation: With patient Time For Goal Achievement: 10/05/19 Potential to Achieve Goals: Good  OT Frequency: Min 2X/week   Barriers to D/C:            Co-evaluation              AM-PAC OT "6 Clicks" Daily Activity     Outcome Measure Help from another person eating meals?: None Help from another person taking care of personal grooming?: A Little Help from another person toileting, which includes using toliet, bedpan, or urinal?: A Little Help from another person bathing (including washing, rinsing, drying)?: A Little Help from another person to put on and taking off regular upper body clothing?: A Little Help from another person to put on and taking off regular lower body clothing?: A Little 6 Click Score: 19   End of Session Equipment Utilized During Treatment: Gait belt;Rolling walker Nurse Communication: Mobility status  Activity Tolerance: Patient tolerated treatment well Patient left: in bed;with call bell/phone within reach;with bed alarm set  OT Visit Diagnosis: Muscle weakness (generalized) (M62.81);Pain;Other symptoms and signs involving cognitive function Pain - Right/Left: Left Pain - part of body: Knee                Time: 1007-1026 OT Time Calculation (min): 19 min Charges:  OT General Charges $OT Visit: 1 Visit OT Evaluation $OT Eval Low Complexity: 1 Low OT Treatments $Self Care/Home Management : 8-22 mins  Zenovia Jarred, MSOT, OTR/L Behavioral Health OT/ Acute Relief OT [facility] Office: [place] 09/21/2019, 2:15  PM

## 2019-09-22 LAB — CBC
HCT: 27.8 % — ABNORMAL LOW (ref 39.0–52.0)
Hemoglobin: 8.9 g/dL — ABNORMAL LOW (ref 13.0–17.0)
MCH: 30.6 pg (ref 26.0–34.0)
MCHC: 32 g/dL (ref 30.0–36.0)
MCV: 95.5 fL (ref 80.0–100.0)
Platelets: 336 10*3/uL (ref 150–400)
RBC: 2.91 MIL/uL — ABNORMAL LOW (ref 4.22–5.81)
RDW: 16.5 % — ABNORMAL HIGH (ref 11.5–15.5)
WBC: 6.4 10*3/uL (ref 4.0–10.5)
nRBC: 0 % (ref 0.0–0.2)

## 2019-09-22 LAB — BASIC METABOLIC PANEL
Anion gap: 10 (ref 5–15)
BUN: 8 mg/dL (ref 8–23)
CO2: 19 mmol/L — ABNORMAL LOW (ref 22–32)
Calcium: 9 mg/dL (ref 8.9–10.3)
Chloride: 111 mmol/L (ref 98–111)
Creatinine, Ser: 1 mg/dL (ref 0.61–1.24)
GFR calc Af Amer: >60 mL/min (ref >60)
GFR calc non Af Amer: >60 mL/min (ref >60)
Glucose, Bld: 93 mg/dL (ref 70–99)
Potassium: 3.5 mmol/L (ref 3.5–5.1)
Sodium: 140 mmol/L (ref 135–145)

## 2019-09-22 LAB — GLUCOSE, CAPILLARY: Glucose-Capillary: 88 mg/dL (ref 70–99)

## 2019-09-22 NOTE — TOC Initial Note (Signed)
Transition of Care [facility]) - Initial/Assessment Note    Patient Details  Name: John [name] MRN: [id number] Date of Birth: Feb 20, 1946  Transition [facility] CM/SW Contact:    Pollie Friar, RN Phone Number: 09/22/2019, 8:07 AM  Clinical Narrative:                 Pt is active with [facility] for [facility] services. Butch Penny with [facility] is aware of pts admission. CM spoke with the patient and he wants to continue with [facility] once d/ced. Will need resumption orders.  TOC following.  Expected Discharge Plan: [place] Barriers to Discharge: Continued Medical Work up   Patient Goals and CMS Choice   CMS Medicare.gov Compare Post Acute Care list provided to:: Patient Choice offered to / list presented to : Patient  Expected Discharge Plan and Services Expected Discharge Plan: [place]   Discharge Planning Services: CM Consult Post Acute Care Choice: [place] arrangements for the past 2 months: Single Family Home                                      Prior Living Arrangements/Services Living arrangements for the past 2 months: Single Family Home Lives with:: Adult Children Patient language and need for interpreter reviewed:: Yes(no needs) Do you feel safe going back to the place where you live?: Yes      Need for Family Participation in Patient Care: Yes (Comment)(24 hour supervision) Care giver support system in place?: Yes (comment) Current home services: DME, Homehealth aide, Home PT, Home RN(walker, shower seat, 3 in 1// active with [facility]) Criminal Activity/Legal Involvement Pertinent to Current Situation/Hospitalization: No - Comment as needed  Activities of Daily Living Home Assistive Devices/Equipment: Eyeglasses, Environmental consultant (specify type) ADL Screening (condition at time of admission) Patient's cognitive ability adequate to safely complete daily activities?: No Is the patient deaf or have difficulty hearing?: No Does the patient have  difficulty seeing, even when wearing glasses/contacts?: No Does the patient have difficulty concentrating, remembering, or making decisions?: Yes Patient able to express need for assistance with ADLs?: Yes Does the patient have difficulty dressing or bathing?: No Independently performs ADLs?: Yes (appropriate for developmental age) Does the patient have difficulty walking or climbing stairs?: Yes Weakness of Legs: Both Weakness of Arms/Hands: None  Permission Sought/Granted                  Emotional Assessment Appearance:: Appears stated age Attitude/Demeanor/Rapport: Engaged Affect (typically observed): Accepting, Pleasant Orientation: : Oriented to Place, Oriented to  Time, Oriented to Situation, Oriented to Self   Psych Involvement: No (comment)  Admission diagnosis:  Syncope and collapse [R55] Hypokalemia [E87.6] Hyponatremia [E87.1] Patient Active Problem List   Diagnosis Date Noted  . FTT (failure to thrive) in adult   . Iron deficiency anemia   . ARF (acute renal failure) ([place]) 09/02/2019  . Hypomagnesemia 09/02/2019  . Acute pain of left knee   . Acute metabolic encephalopathy 57/84/6962  . Goals of care, counseling/discussion 08/05/2019  . Acute renal failure (ARF) ([place]) 07/08/2019  . Hypokalemia 07/08/2019  . Vascular dementia without behavioral disturbance ([place])   . Helicobacter pylori gastritis   . Chronic duodenal ulcer with hemorrhage   . Multiple gastric ulcers   . Acute esophagitis   . Gastrointestinal hemorrhage 04/20/2019  . Vitamin B12 deficiency 04/20/2019  . Folate deficiency 04/20/2019  .  Dehydration 04/20/2019  . Hypotension 04/20/2019  . Melena   . Acute blood loss anemia   . CKD (chronic kidney disease), stage III ([place]) 04/08/2019  . Right lower lobe lung mass 04/08/2019  . Syncope and collapse 04/08/2019  . Symptomatic anemia 04/08/2019  . Occult GI bleeding 04/08/2019  . Hyperglycemia 04/08/2019  . Leukocytosis 04/08/2019  .  Syncope 04/08/2019  . Low hemoglobin   . Dyspnea on exertion 08/25/2017  . Bursitis of elbow 03/15/2016  . Obesity (BMI 30-39.9) 12/03/2015  . Primary cancer of right lower lobe of lung ([place]) 06/14/2015  . Solitary pulmonary nodule 03/13/2015  . RVF (right ventricular failure) ([place]) 11/04/2014  . COPD II/III with reversibility  12/19/2012  . Chronic respiratory failure with hypoxia ([place]) 12/18/2012  . Drug-induced hyperglycemia 12/08/2012  . Allergic drug rash 12/06/2012  . Pulmonary HTN ([place]) 03/18/2012  . Abnormal echocardiogram 03/18/2012  . LV dysfunction 03/18/2012  . COPD exacerbation ([place]) 02/22/2012  . Chronic diastolic CHF (congestive heart failure) ([place]) 02/22/2012  . Diabetes mellitus ([place]) 02/22/2012  . HTN (hypertension) 02/22/2012  . CAD (coronary artery disease) 02/22/2012  . Acute on chronic systolic heart failure ([place]) 02/22/2012   PCP:  [name], NP (Inactive) Pharmacy:   [place], [place] - [address] [place] [address] Phone: [phone] Fax: [phone]     Social Determinants of Health (SDOH) Interventions    Readmission Risk Interventions Readmission Risk Prevention Plan 07/09/2019 04/30/2019 04/30/2019  Transportation Screening Complete - Complete  Medication Review (RN Care Manager) Complete - Complete  PCP or Specialist appointment within 3-5 days of discharge - Complete -  [place] or Home Care Consult Complete - Complete  SW Recovery Care/Counseling Consult Complete - -  Palliative Care Screening Not Applicable - Not [place] Not Applicable - Not Applicable  Some recent data might be hidden

## 2019-09-22 NOTE — Care Management Important Message (Signed)
Important Message  Patient Details  Name: BENJAMEN KOELLING MRN: [id number] Date of Birth: 1946-01-20   Medicare Important Message Given:  Yes     [name] 09/22/2019, 2:10 PM

## 2019-09-22 NOTE — Progress Notes (Signed)
PROGRESS NOTE    John [name]  BOF:[id number] DOB: March 09, 1946 DOA: 09/19/2019 PCP: [name], NP (Inactive)   Brief Narrative: 73 year old black male community dwelling resident-recurrent NSCLC + chemo, HFpEF, COPD Gold 3, gout, DM TY 2-last A1c 4.6, vascular dementia,?  CAD, about 10 admissions over the past year Most recently 09/05/2019 anorexia malaise abdominal pain nausea vomiting and probable UGI bleed 2/2 duodenal ulcer-has had H. pylori in the past and also has had IR embolization of GDA 04/29/2019 Admit 9/20 syncopal episode?  Seizure with no definite postictal issues  Subjective:   Assessment & Plan:  Syncope: Prior history of recurrent syncope and  Had witnessed myoclonic movement eye rolling Presyncopal episode-family concerned about seizure. No incontinence or definitive postictal symptoms. EEG obtained and showed to moderate diffuse encephalopathy. echo unremarkable.  Discussed with neurologist Dr Cheral Marker 9/21- since EEG no seizures doubt he has a seizure, likely cephalopathy in the setting of dementia. Chart review shows that patient has had acute metabolic encephalopathy in the setting of dementia back in August. Orthostatics + still  Acute metabolic encephalopathy in the setting of dementia, likely from medication/progression of dementia--cut back seroquel from 25 bid to 25 qd and more awake  History of orthostatic hypotension.  On previous admission beta-blocker clonidine and diuretics were discontinued.  Monitor orthostatic vitals here-see #1.  Hypokalemia: Improved.  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  Watch cautiously while we are hydrating with IV fluids  Diabetes mellitus type DP:OEUMPN hemoglobin A1c 4.6.  Has poor appetite so oral meds were discontinued on last discharge on 9/2.  Monitor blood sugar.  TIR:WERXVQ.On amlodipine 10 mg-decrease to 5 mg due to orthostatic hypotension  Primary cancer of right lower  lobe of lung, has completed SBRT for recurrent non-small cell lung cancer, outpatient follow-up with oncology when able  CKD stage MGQ:QPYPPJKDTO improved to 1.17.  Monitor  Anemia of chronic disease/recent duodenal ulcer with upper GI bleed.  Continue PPI, monitor hemoglobin.  Arthritis/knee pain, x-ray of the left knee shows " Severe tricompartmental arthrosis, with near total medial compartment joint space loss", secondary to arthritis continue Tylenol pain control.  Metabolic acidosis- bicarb up at 20, likely from poor po intake/dehydration- family reproted he has not been eating and drinking well. Cont ivf  Poor oral intake with lethargy excessive sleepiness daytime: DC'd a.m. Seroquel.Remeron added. Dietitian consulted.  Increase oral intake.  Continue his Megace.  DVT prophylaxis:Lovenox Code Status:Full Family Communication:  Disposition: Remains inpatient pending improvement in the orthostatic vitals-cont on IV fluid hydration and  we are adjusting medication and therapy for him.  Consultants: Neurology curbside- but not improving neuro will see the patient formally.    Procedures:  IZT:IWPY study is suggestive of mild to moderate diffuse encephalopathy, nonspecific to etiology. No seizures or epileptiform discharges were seen throughout the recording.  KDX:IPJA ventricular ejection fraction, by visual estimation, is 60 to 65%. The left ventricle has normal function. Normal left ventricular size. There is no left ventricular hypertrophy.  2. Left ventricular diastolic Doppler parameters are consistent with impaired relaxation pattern of LV diastolic filling.  3. Global right ventricle has normal systolic function.The right ventricular size is normal. No increase in right ventricular wall thickness.  4. Left atrial size was normal.  5. Right atrial size was normal.  6. The mitral valve is normal in structure. No evidence of mitral valve regurgitation. No evidence of mitral  stenosis.  7. The tricuspid valve is normal in structure. Tricuspid valve  regurgitation was not visualized by color flow Doppler.  8. The aortic valve is normal in structure. Aortic valve regurgitation was not visualized by color flow Doppler. Structurally normal aortic valve, with no evidence of sclerosis or stenosis.  9. The pulmonic valve was normal in structure. Pulmonic valve regurgitation is trivial by color flow Doppler. 10. The inferior vena cava is normal in size with greater than 50% respiratory variability, suggesting right atrial pressure of 3 mmHg.  Microbiology: COVID 19 negative  Antimicrobials: Anti-infectives (From admission, onward)   None     Objective: Vitals:   09/22/19 0700 09/22/19 0854 09/22/19 1202 09/22/19 1531  BP:  (!) 166/83 (!) 146/79 (!) 154/74  Pulse:  85 88 86  Resp:  18 18 18   Temp:  98.7 F (37.1 C) 98.2 F (36.8 C) 98.2 F (36.8 C)  TempSrc:  Oral Oral Oral  SpO2: 98% 100% 100% 100%  Weight:      Height:        Intake/Output Summary (Last 24 hours) at 09/22/2019 1611 Last data filed at 09/22/2019 1500 Gross per 24 hour  Intake 240 ml  Output 650 ml  Net -410 ml   Filed Weights   09/20/19 2055 09/22/19 0600  Weight: 68.4 kg 70.2 kg   Weight change: 1.8 kg  Body mass index is 23.53 kg/m.  Intake/Output from previous day: 09/22 0701 - 09/23 0700 In: 320 [P.O.:320] Out: 76 [Urine:1100] Intake/Output this shift: Total I/O In: 240 [P.O.:240] Out: -   Examination: Awake alert ncat eomi cta b clear no wheeze  abd soft nt nd Le's soft non tender Neuro intact No focal deficit  Medications:  Scheduled Meds: . allopurinol  300 mg Oral Daily  . amLODipine  5 mg Oral Daily  . atorvastatin  20 mg Oral Daily  . enoxaparin (LOVENOX) injection  40 mg Subcutaneous Q24H  . feeding supplement (ENSURE ENLIVE)  237 mL Oral TID BM  . ferrous sulfate  325 mg Oral Q M,W,F  . folic acid  1 mg Oral Daily  . magnesium oxide  200 mg Oral  BID  . megestrol  400 mg Oral Daily  . mirtazapine  7.5 mg Oral QHS  . mometasone-formoterol  2 puff Inhalation BID  . pantoprazole  40 mg Oral BID AC  . potassium chloride SA  20 mEq Oral BID  . QUEtiapine  25 mg Oral QHS  . senna-docusate  1 tablet Oral QHS  . sodium chloride flush  3 mL Intravenous Q12H  . sucralfate  1 g Oral QID  . cyanocobalamin  1,000 mcg Oral Daily   Continuous Infusions: . sodium chloride 100 mL/hr at 09/22/19 2831    Data Reviewed: I have personally reviewed following labs and imaging studies  CBC: Recent Labs  Lab 09/17/19 1514 09/19/19 2056 09/20/19 0333 09/21/19 0413 09/22/19 0416  WBC 9.5 6.8 6.9 8.3 6.4  NEUTROABS 8.3*  --   --   --   --   HGB 9.9* 10.2* 9.7* 8.9* 8.9*  HCT 30.8* 32.4* 30.9* 27.7* 27.8*  MCV 96.3 98.5 97.5 96.2 95.5  PLT 383 440* 364 350 517   Basic Metabolic Panel: Recent Labs  Lab 09/17/19 1514 09/19/19 2056 09/20/19 0333 09/21/19 0413 09/22/19 0416  NA 141 133* 136 140 140  K 3.0* 3.1* 3.6 3.5 3.5  CL 107 101 106 110 111  CO2 23 19* 19* 20* 19*  GLUCOSE 123* 138* 103* 94 93  BUN 15 13 14 9  8  CREATININE 1.43* 1.29* 1.17 1.06 1.00  CALCIUM 9.3 8.9 8.9 8.9 9.0   GFR: Estimated Creatinine Clearance: 62.7 mL/min (by C-G formula based on SCr of 1 mg/dL). Liver Function Tests: No results for input(s): AST, ALT, ALKPHOS, BILITOT, PROT, ALBUMIN in the last 168 hours. No results for input(s): LIPASE, AMYLASE in the last 168 hours. No results for input(s): AMMONIA in the last 168 hours. Coagulation Profile: No results for input(s): INR, PROTIME in the last 168 hours. Cardiac Enzymes: No results for input(s): CKTOTAL, CKMB, CKMBINDEX, TROPONINI in the last 168 hours. BNP (last 3 results) No results for input(s): PROBNP in the last 8760 hours. HbA1C: No results for input(s): HGBA1C in the last 72 hours. CBG: Recent Labs  Lab 09/19/19 2214 09/20/19 1027 09/20/19 1200 09/21/19 0646 09/22/19 0619  GLUCAP 120*  80 84 98 88   Lipid Profile: No results for input(s): CHOL, HDL, LDLCALC, TRIG, CHOLHDL, LDLDIRECT in the last 72 hours. Thyroid Function Tests: No results for input(s): TSH, T4TOTAL, FREET4, T3FREE, THYROIDAB in the last 72 hours. Anemia Panel: No results for input(s): VITAMINB12, FOLATE, FERRITIN, TIBC, IRON, RETICCTPCT in the last 72 hours. Sepsis Labs: No results for input(s): PROCALCITON, LATICACIDVEN in the last 168 hours.  Recent Results (from the past 240 hour(s))  SARS CORONAVIRUS 2 (TAT 6-24 HRS) Nasopharyngeal Nasopharyngeal Swab     Status: None   Collection Time: 09/19/19 10:05 PM   Specimen: Nasopharyngeal Swab  Result Value Ref Range Status   SARS Coronavirus 2 NEGATIVE NEGATIVE Final    Comment: (NOTE) SARS-CoV-2 target nucleic acids are NOT DETECTED. The SARS-CoV-2 RNA is generally detectable in upper and lower respiratory specimens during the acute phase of infection. Negative results do not preclude SARS-CoV-2 infection, do not rule out co-infections with other pathogens, and should not be used as the sole basis for treatment or other patient management decisions. Negative results must be combined with clinical observations, patient history, and epidemiological information. The expected result is Negative. Fact Sheet for Patients: SugarRoll.be Fact Sheet for Healthcare Providers: https://www.woods-mathews.com/ This test is not yet approved or cleared by the [place] FDA and  has been authorized for detection and/or diagnosis of SARS-CoV-2 by FDA under an Emergency Use Authorization (EUA). This EUA will remain  in effect (meaning this test can be used) for the duration of the COVID-19 declaration under Section 56 4(b)(1) of the Act, 21 U.S.C. section 360bbb-3(b)(1), unless the authorization is terminated or revoked sooner. Performed at [facility], [place] [address] [place], [place] [address]       Radiology Studies: No results found.    LOS: 3 days   Time spent: More than 50% of that time was spent in counseling and/or coordination of care.  Nita Sells, MD [facility]  09/22/2019, 4:11 PM

## 2019-09-23 LAB — CBC
HCT: 24.5 % — ABNORMAL LOW (ref 39.0–52.0)
Hemoglobin: 8.3 g/dL — ABNORMAL LOW (ref 13.0–17.0)
MCH: 32 pg (ref 26.0–34.0)
MCHC: 33.9 g/dL (ref 30.0–36.0)
MCV: 94.6 fL (ref 80.0–100.0)
Platelets: 287 10*3/uL (ref 150–400)
RBC: 2.59 MIL/uL — ABNORMAL LOW (ref 4.22–5.81)
RDW: 16.5 % — ABNORMAL HIGH (ref 11.5–15.5)
WBC: 6.9 10*3/uL (ref 4.0–10.5)
nRBC: 0 % (ref 0.0–0.2)

## 2019-09-23 LAB — BASIC METABOLIC PANEL
Anion gap: 8 (ref 5–15)
BUN: 7 mg/dL — ABNORMAL LOW (ref 8–23)
CO2: 20 mmol/L — ABNORMAL LOW (ref 22–32)
Calcium: 9.1 mg/dL (ref 8.9–10.3)
Chloride: 111 mmol/L (ref 98–111)
Creatinine, Ser: 1.12 mg/dL (ref 0.61–1.24)
GFR calc Af Amer: >60 mL/min (ref >60)
GFR calc non Af Amer: >60 mL/min (ref >60)
Glucose, Bld: 95 mg/dL (ref 70–99)
Potassium: 3.8 mmol/L (ref 3.5–5.1)
Sodium: 139 mmol/L (ref 135–145)

## 2019-09-23 MED ORDER — ENSURE ENLIVE PO LIQD: 237.0000 mL | Freq: Three times a day (TID) | ORAL | 12 refills | Status: DC

## 2019-09-23 MED ORDER — AMLODIPINE BESYLATE 2.5 MG PO TABS: 2.5000 mg | ORAL_TABLET | Freq: Every day | ORAL | 3 refills | Status: DC

## 2019-09-23 MED ORDER — AMLODIPINE BESYLATE 5 MG PO TABS: 10.0000 mg | ORAL_TABLET | Freq: Every day | ORAL | 0 refills | Status: DC

## 2019-09-23 MED ORDER — SODIUM CHLORIDE 0.9 % IV SOLN
510.0000 mg | Freq: Once | INTRAVENOUS | Status: AC
  Administered 2019-09-23: 510 mg via INTRAVENOUS
  Filled 2019-09-23: qty 17

## 2019-09-23 NOTE — Progress Notes (Signed)
Physical Therapy Treatment Patient Details Name: John [name] MRN: [id number] DOB: 14-Dec-1946 Today's Date: 09/23/2019    History of Present Illness John [name] is a 73 y.o. male admitted with possible seizure, history of recurrent non-small cell lung cancer received chemotherapy in the first week of August last month, diastolic CHF COPD diabetes mellitus, gout admitted with acute renal failure    PT Comments    Patient seen for mobility progression. Pt agreeable to participate in therapy however became less engaged after he started to mobilize. Pt denied dizziness or lightheadedness with postural changes and continues to c/o L knee pain. Pt requires supervision/min guard for OOB mobility with RW. Pt agreeable to short distance gait in room only. Continue to progress as tolerated.     Follow Up Recommendations  Home health PT;Supervision/Assistance - 24 hour     Equipment Recommendations  None recommended by PT    Recommendations for Other Services       Precautions / Restrictions Precautions Precautions: Fall Restrictions Weight Bearing Restrictions: No    Mobility  Bed Mobility Overal bed mobility: Modified Independent Bed Mobility: Sit to Supine;Supine to Sit           General bed mobility comments: increased time   Transfers Overall transfer level: Needs assistance Equipment used: Rolling walker (2 wheeled) Transfers: Sit to/from Stand Sit to Stand: Min guard         General transfer comment: min guard for safety  Ambulation/Gait Ambulation/Gait assistance: Supervision Gait Distance (Feet): 20 Feet Assistive device: Rolling walker (2 wheeled) Gait Pattern/deviations: Step-through pattern;Decreased stride length Gait velocity: decreased   General Gait Details: cues for upright posture; pt c/o L knee pain while mobilizing    Stairs             Wheelchair Mobility    Modified Rankin (Stroke Patients Only)       Balance Overall  balance assessment: Needs assistance Sitting-balance support: No upper extremity supported;Feet supported Sitting balance-Leahy Scale: Good     Standing balance support: Bilateral upper extremity supported Standing balance-Leahy Scale: Poor Standing balance comment: pt leaning on sink to was hands                            Cognition Arousal/Alertness: Awake/alert Behavior During Therapy: WFL for tasks assessed/performed Overall Cognitive Status: No family/caregiver present to determine baseline cognitive functioning Area of Impairment: Orientation;Problem solving;Memory                 Orientation Level: (did not attempt to answer orientation questions when asked)   Memory: Decreased short-term memory       Problem Solving: Slow processing;Requires verbal cues;Decreased initiation General Comments: h/o vascular dementia; confabulates/covers when unsure of answers; tasks need to be broken down into simple steps      Exercises      General Comments        Pertinent Vitals/Pain Pain Assessment: Faces Faces Pain Scale: Hurts little more Pain Location: left knee Pain Descriptors / Indicators: Grimacing;Guarding;Discomfort;Sore Pain Intervention(s): Limited activity within patient's tolerance;Monitored during session;Repositioned    Home Living                      Prior Function            PT Goals (current goals can now be found in the care plan section) Acute Rehab PT Goals Patient Stated Goal: to decrease knee pain Progress towards PT  goals: Progressing toward goals    Frequency    Min 3X/week      PT Plan Current plan remains appropriate    Co-evaluation              AM-PAC PT "6 Clicks" Mobility   Outcome Measure  Help needed turning from your back to your side while in a flat bed without using bedrails?: None Help needed moving from lying on your back to sitting on the side of a flat bed without using bedrails?: A  Little Help needed moving to and from a bed to a chair (including a wheelchair)?: A Little Help needed standing up from a chair using your arms (e.g., wheelchair or bedside chair)?: A Little Help needed to walk in hospital room?: A Little Help needed climbing 3-5 steps with a railing? : A Little 6 Click Score: 19    End of Session Equipment Utilized During Treatment: Gait belt Activity Tolerance: Patient tolerated treatment well Patient left: in bed;with call bell/phone within reach;with bed alarm set Nurse Communication: Mobility status PT Visit Diagnosis: Muscle weakness (generalized) (M62.81);Difficulty in walking, not elsewhere classified (R26.2);Pain Pain - Right/Left: Left Pain - part of body: Knee     Time: 0902-0924 PT Time Calculation (min) (ACUTE ONLY): 22 min  Charges:  $Gait Training: 8-22 mins                     Earney Navy, PTA Acute Rehabilitation Services Pager: [phone] Office: [phone]     Darliss Cheney 09/23/2019, 1:14 PM

## 2019-09-23 NOTE — Progress Notes (Signed)
Patient with significant decrease during orthostatic blood pressure measurement.  MD aware. Planning to adjust meds. Will have patient sit in chair and remeasure after stabilization period to assess for symptoms. Wendee Copp

## 2019-09-23 NOTE — Plan of Care (Signed)
  Problem: Activity: Goal: Risk for activity intolerance will decrease Outcome: Progressing   Problem: Elimination: Goal: Will not experience complications related to bowel motility Outcome: Progressing Goal: Will not experience complications related to urinary retention Outcome: Progressing   Problem: Pain Managment: Goal: General experience of comfort will improve Outcome: Progressing   Problem: Safety: Goal: Ability to remain free from injury will improve Outcome: Progressing   Problem: Safety: Goal: Ability to remain free from injury will improve Outcome: Progressing   Problem: Skin Integrity: Goal: Risk for impaired skin integrity will decrease Outcome: Progressing

## 2019-09-23 NOTE — Discharge Summary (Addendum)
Physician Discharge Summary  John [name] ZDG:[id number] DOB: 09-Dec-1946 DOA: 09/19/2019  PCP: [name], NP (Inactive)  Admit date: 09/19/2019 Discharge date: 09/23/2019  Time spent: 45 minutes  Recommendations for Outpatient Follow-up:  1. Consider IV iron every [redacted] weeks along with discussion about IV iron stores in addition to checking the same 2. Get CBC plus differential, Chem-12, TSH, folate acid and B12 in the outpatient setting--consider this in 2 to 3 weeks from the time of discharge 3. No dosage changes of Seroquel from 25 twice daily to 25 once daily on discharge 4. Does need outpatient follow-up with his oncologist 5. Cut back norvasc 2/2 orthostasis--titrae up or down as prn  Discharge Diagnoses:  Active Problems:   Chronic diastolic CHF (congestive heart failure) (HCC)   Diabetes mellitus (HCC)   HTN (hypertension)   Primary cancer of right lower lobe of lung (HCC)   CKD (chronic kidney disease), stage III (HCC)   Low hemoglobin   Syncope   Hypokalemia   Discharge Condition: Fair  Diet recommendation: Heart healthy  Filed Weights   09/20/19 2055 09/22/19 0600  Weight: 68.4 kg 70.2 kg    History of present illness:  73 year old black male community dwelling resident-recurrent NSCLC + chemo, HFpEF, COPD Gold 3, gout, DM TY 2-last A1c 4.6, vascular dementia,?  CAD, about 10 admissions over the past year Most recently 09/05/2019 anorexia malaise abdominal pain nausea vomiting and probable UGI bleed 2/2 duodenal ulcer-has had H. pylori in the past and also has had IR embolization of GDA 04/29/2019 Admit 9/20 syncopal episode?  Seizure with no definite postictal issues  Hospital Course:  Syncope: Prior history of recurrent syncope and  Had witnessed myoclonic movement eye rolling Presyncopal episode-family concerned about seizure. No incontinence or definitive postictal symptoms. EEG obtained and showed to moderate diffuse encephalopathy. echo unremarkable.   Discussed with neurologist Dr Cheral Marker 9/21- since EEG no seizures doubt he has a seizure, likely cephalopathy in the setting of dementia. Chart review shows that patient has had acute metabolic encephalopathy in the setting of dementia back in August. Orthostatics + still so meds were adjusted as per below  Acute metabolic encephalopathy in the setting of dementia, likely from medication/progression of dementia--cut back seroquel from 25 bid to 25 qd and more awake  History of orthostatic hypotension.  On previous admission beta-blocker clonidine and diuretics were discontinued.  Monitor orthostatic vitals here-see #1.  Hypokalemia: Improved.  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  Was on IV fluids transiently during hospital stay  Diabetes mellitus type VZ:DGLOVF hemoglobin A1c 4.6.  Has poor appetite so oral meds were discontinued on last discharge on 9/2.    Blood sugars have been below 100 he will need to be supplemented with a diet going forward  IEP:PIRJJO.On amlodipine 10 mg-decrease to 2.5 mg due to severe orthostatic hypotension  Primary cancer of right lower lobe of lung, has completed SBRT for recurrent non-small cell lung cancer, outpatient follow-up with oncology when able  CKD stage ACZ:YSAYTKZSWF was elevated earlier in the admission but improved to baseline on discharge  Anemia of chronic disease/recent duodenal ulcer with upper GI bleed.  Continue PPI, monitor hemoglobin.  As an outpatient-hemoglobin on discharge was 8.3 He was given a dose of IV iron prior to discharge and may need this done in the outpatient setting in addition  Arthritis/knee pain, x-ray of the left knee shows " Severe tricompartmental arthrosis, with near total medial compartment joint space loss", secondary  to arthritis continue Tylenol pain control.  Metabolic acidosis-likely secondary to starvation ketosis versus AKI on admission-patient CO2 was improved  to 20 on discharge and this will need to be rechecked in the outpatient setting  Poor oral intake with lethargy excessive sleepiness daytime: DC'd a.m. Seroquel.Remeron added. Dietitian consulted.  Increase oral intake.  Continue his Megace.  Procedures: Multiple Consultations:  None  Discharge Exam: Vitals:   09/23/19 0823 09/23/19 0850  BP: 138/74   Pulse: 89   Resp: 18   Temp: 98.8 F (37.1 C)   SpO2: 100% 99%    General: Awake alert slightly sleepy this morning no discomfort no icterus no pallor Cardiovascular: S1-S2 no murmur rub or gallop Respiratory: Clinically clear no added sound Abdomen soft nontender no rebound no guarding no lower extremity edema range of motion intact Gait was assessed and he was little bit hesitant  Discharge Instructions   Discharge Instructions    Diet - low sodium heart healthy   Complete by: As directed    Increase activity slowly   Complete by: As directed      Allergies as of 09/23/2019      Reactions   Levaquin [levofloxacin In D5w] Other (See Comments)   Unknown reaction   Lisinopril Swelling   Facial swelling      Medication List    TAKE these medications   acetaminophen 325 MG tablet Commonly known as: TYLENOL Take 2 tablets (650 mg total) by mouth every 6 (six) hours as needed for mild pain (or Fever >/= 101).   albuterol 108 (90 Base) MCG/ACT inhaler Commonly known as: ProAir HFA INHALE 2 PUFFS BY MOUTH EVERY 4 HOURS AS NEEDED FOR WHEEZING What changed:   how much to take  how to take this  when to take this  reasons to take this  additional instructions   allopurinol 300 MG tablet Commonly known as: ZYLOPRIM Take 300 mg by mouth daily.   amLODipine 2.5 MG tablet Commonly known as: NORVASC Take 1 tablet (2.5 mg total) by mouth daily. What changed:   medication strength  how much to take   atorvastatin 20 MG tablet Commonly known as: LIPITOR Take 1 tablet (20 mg total) by mouth daily.    budesonide-formoterol 160-4.5 MCG/ACT inhaler Commonly known as: SYMBICORT Inhale 2 puffs into the lungs 2 (two) times daily.   cyanocobalamin 1000 MCG tablet Take 1 tablet (1,000 mcg total) by mouth daily.   feeding supplement (ENSURE ENLIVE) Liqd Take 237 mLs by mouth 3 (three) times daily between meals.   ferrous sulfate 325 (65 FE) MG tablet Take 1 tablet (325 mg total) by mouth every Monday, Wednesday, and Friday.   folic acid 1 MG tablet Commonly known as: FOLVITE Take 1 tablet (1 mg total) by mouth daily.   Magnesium Oxide 200 MG Tabs Take 1 tablet (200 mg total) by mouth 2 (two) times daily.   megestrol 400 MG/10ML suspension Commonly known as: MEGACE Take 10 mLs (400 mg total) by mouth daily.   multivitamin with minerals Tabs tablet Take 1 tablet by mouth daily. Over the counter   pantoprazole 40 MG tablet Commonly known as: PROTONIX Take 1 tablet (40 mg total) by mouth 2 (two) times daily before a meal.   potassium chloride SA 20 MEQ tablet Commonly known as: K-DUR Take 1 tablet (20 mEq total) by mouth 2 (two) times daily.   QUEtiapine 25 MG tablet Commonly known as: SEROQUEL Take 1 tablet (25 mg total) by mouth 2 (two)  times daily.   senna-docusate 8.6-50 MG tablet Commonly known as: Senokot-S Take 1 tablet by mouth at bedtime.   sucralfate 1 g tablet Commonly known as: CARAFATE Take 1 g by mouth 4 (four) times daily.   traMADol 50 MG tablet Commonly known as: ULTRAM Take 1 tablet (50 mg total) by mouth every 6 (six) hours as needed for moderate pain.   Vitamin D (Ergocalciferol) 1.25 MG (50000 UT) Caps capsule Commonly known as: DRISDOL Take 50,000 Units by mouth every Friday.   Vitamin D3 50 MCG (2000 UT) capsule Take 2,000 Units by mouth daily.      Allergies  Allergen Reactions  . Levaquin [Levofloxacin In D5w] Other (See Comments)    Unknown reaction  . Lisinopril Swelling    Facial swelling      The results of significant  diagnostics from this hospitalization (including imaging, microbiology, ancillary and laboratory) are listed below for reference.    Significant Diagnostic Studies: Ct Abdomen Pelvis Wo Contrast  Result Date: 09/02/2019 CLINICAL DATA:  Abd pain, gastroenteritis or colitis suspected. Right lower lobe lung cancer. EXAM: CT ABDOMEN AND PELVIS WITHOUT CONTRAST TECHNIQUE: Multidetector CT imaging of the abdomen and pelvis was performed following the standard protocol without IV contrast. COMPARISON:  PET scan 05/26/2019 FINDINGS: Lower chest: Right lower lobe lung mass has slightly decreased in size, now measuring 4.6 x 3.7 cm. No new lesions are present. There is mild dependent atelectasis. Heart size is normal. Coronary artery calcifications are present. Hepatobiliary: The liver is unremarkable. Prominent gallstones are present in the neck of the gallbladder. No inflammatory changes are present. The common bile duct is within normal limits. Pancreas: Vascular coils are present in the pancreatic head, unchanged. Pancreas is otherwise unremarkable. Spleen: Normal in size without focal abnormality. Adrenals/Urinary Tract: Two upper pole right kidney stones are again seen, measuring up to 7 mm. These are both nonobstructing. A punctate nonobstructing stone is present at the lower pole of the right kidney. Exophytic cyst arising from the right kidney measures 4.5 cm, stable. Left kidney is within normal limits. Ureters are unremarkable. The urinary bladder is within normal limits. Stomach/Bowel: Minimal contrast is present in the stomach. The stomach and duodenum are otherwise unremarkable. Small bowel is within normal limits. Terminal ileum is normal. The ascending and transverse colon are within normal limits. Descending and sigmoid colon are normal. Vascular/Lymphatic: Atherosclerotic calcifications are present in the aorta and branch vessels. There is no aneurysm. Reproductive: Calcifications are present within the  prostate gland. Gland is normal in size. Other: No abdominal wall hernia or abnormality. No abdominopelvic ascites. Musculoskeletal: Multilevel degenerative changes are again noted in the lumbar spine. No new focal lytic or blastic lesions are present. Bony pelvis is within normal limits. Degenerative changes are noted again at the ischial tuberosities and both hips. IMPRESSION: 1. No acute or focal abnormality abdomen to explain the patient's abdominal pain. 2. Nonobstructing right-sided nephrolithiasis. 3.  Aortic Atherosclerosis (ICD10-I70.0). 4. Multilevel degenerative changes of the lumbar spine. 5. Coronary artery disease. 6. Slight decrease in size of known right lower lobe lung mass. Electronically Signed   By: San Morelle M.D.   On: 09/02/2019 05:04   Ct Knee Left Wo Contrast  Result Date: 08/25/2019 CLINICAL DATA:  Chronic knee swelling.  Fall this morning. EXAM: CT OF THE LEFT  KNEE WITHOUT CONTRAST TECHNIQUE: Multidetector CT imaging of the left knee was performed according to the standard protocol. Multiplanar CT image reconstructions were also generated. COMPARISON:  Left knee  x-rays from same day. FINDINGS: Bones/Joint/Cartilage No acute fracture or dislocation. Moderate joint effusion. Moderate to severe medial compartment joint space narrowing. Mild lateral and patellofemoral compartment joint space narrowing. Tricompartmental marginal osteophytes. Subchondral sclerosis in the medial compartment. Mild genu valgus deformity. Ligaments Suboptimally assessed by CT. Muscles and Tendons Grossly intact. Soft tissues Vascular calcifications.  No soft tissue mass or fluid collection. IMPRESSION: 1.  No acute osseous abnormality. 2. 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 3. Moderate joint effusion. Electronically Signed   By: Titus Dubin M.D.   On: 08/25/2019 18:23   Dg Knee Complete 4 Views Left  Result Date: 09/17/2019 CLINICAL DATA:  Fall, pain  EXAM: LEFT KNEE - COMPLETE 4+ VIEW COMPARISON:  08/25/2019 FINDINGS: No fracture or dislocation of the left knee. Severe tricompartmental arthrosis, with near total medial compartment joint space loss. No knee joint effusion. Vascular calcinosis. IMPRESSION: No fracture or dislocation of the left knee. Severe tricompartmental arthrosis, with near total medial compartment joint space loss. Electronically Signed   By: Eddie Candle M.D.   On: 09/17/2019 15:55   Dg Knee Complete 4 Views Left  Result Date: 08/25/2019 CLINICAL DATA:  Status post fall, left knee pain EXAM: LEFT KNEE - COMPLETE 4+ VIEW COMPARISON:  None. FINDINGS: No acute fracture or dislocation. Moderate-severe medial femorotibial compartment joint space narrowing. Mild lateral femorotibial compartment joint space narrowing. Mild patellofemoral compartment joint space narrowing. Tricompartmental marginal osteophytes. Large joint effusion. Peripheral vascular atherosclerotic disease. IMPRESSION: 1. No acute osseous injury of the left knee. 2. Tricompartmental osteoarthritis of the left knee most severe in the medial femorotibial compartment. Electronically Signed   By: Kathreen Devoid   On: 08/25/2019 14:25   Dg Abd Acute 2+v W 1v Chest  Result Date: 09/02/2019 CLINICAL DATA:  Nausea. EXAM: DG ABDOMEN ACUTE W/ 1V CHEST COMPARISON:  CT chest abdomen pelvis 04/21/2019 FINDINGS: Right lung base opacity corresponds to right lower lobe prior CT. Upper normal heart size. No evidence of acute airspace disease. No pleural fluid. No bowel dilatation to suggest obstruction. Embolization coils in the right upper quadrant. No free air. Right upper quadrant calcifications represent gallstones as well as right renal stones. Multilevel degenerative change in the spine. IMPRESSION: 1. Nonobstructive bowel gas pattern.  No free air. 2. Embolization coils in the right upper quadrant. 3. Right renal stones and gallstones. 4. Right lung base opacities correspond to right  lung mass on prior CT. Electronically Signed   By: Keith Rake M.D.   On: 09/02/2019 03:02    Microbiology: Recent Results (from the past 240 hour(s))  SARS CORONAVIRUS 2 (TAT 6-24 HRS) Nasopharyngeal Nasopharyngeal Swab     Status: None   Collection Time: 09/19/19 10:05 PM   Specimen: Nasopharyngeal Swab  Result Value Ref Range Status   SARS Coronavirus 2 NEGATIVE NEGATIVE Final    Comment: (NOTE) SARS-CoV-2 target nucleic acids are NOT DETECTED. The SARS-CoV-2 RNA is generally detectable in upper and lower respiratory specimens during the acute phase of infection. Negative results do not preclude SARS-CoV-2 infection, do not rule out co-infections with other pathogens, and should not be used as the sole basis for treatment or other patient management decisions. Negative results must be combined with clinical observations, patient history, and epidemiological information. The expected result is Negative. Fact Sheet for Patients: SugarRoll.be Fact Sheet for Healthcare Providers: https://www.woods-mathews.com/ This test is not yet approved or cleared by the [place] FDA and  has been authorized for detection and/or diagnosis of  SARS-CoV-2 by FDA under an Emergency Use Authorization (EUA). This EUA will remain  in effect (meaning this test can be used) for the duration of the COVID-19 declaration under Section 56 4(b)(1) of the Act, 21 U.S.C. section 360bbb-3(b)(1), unless the authorization is terminated or revoked sooner. Performed at [facility], [place] [address], [place], [place] [address]      Labs: Basic Metabolic Panel: Recent Labs  Lab 09/19/19 2056 09/20/19 0333 09/21/19 0413 09/22/19 0416 09/23/19 0325  NA 133* 136 140 140 139  K 3.1* 3.6 3.5 3.5 3.8  CL 101 106 110 111 111  CO2 19* 19* 20* 19* 20*  GLUCOSE 138* 103* 94 93 95  BUN 13 14 9 8  7*  CREATININE 1.29* 1.17 1.06 1.00 1.12  CALCIUM 8.9 8.9 8.9  9.0 9.1   Liver Function Tests: No results for input(s): AST, ALT, ALKPHOS, BILITOT, PROT, ALBUMIN in the last 168 hours. No results for input(s): LIPASE, AMYLASE in the last 168 hours. No results for input(s): AMMONIA in the last 168 hours. CBC: Recent Labs  Lab 09/17/19 1514 09/19/19 2056 09/20/19 0333 09/21/19 0413 09/22/19 0416 09/23/19 0325  WBC 9.5 6.8 6.9 8.3 6.4 6.9  NEUTROABS 8.3*  --   --   --   --   --   HGB 9.9* 10.2* 9.7* 8.9* 8.9* 8.3*  HCT 30.8* 32.4* 30.9* 27.7* 27.8* 24.5*  MCV 96.3 98.5 97.5 96.2 95.5 94.6  PLT 383 440* 364 350 336 287   Cardiac Enzymes: No results for input(s): CKTOTAL, CKMB, CKMBINDEX, TROPONINI in the last 168 hours. BNP: BNP (last 3 results) Recent Labs    04/28/19 1529 09/01/19 2250 09/20/19 0333  BNP 49.5 42.3 38.9    ProBNP (last 3 results) No results for input(s): PROBNP in the last 8760 hours.  CBG: Recent Labs  Lab 09/19/19 2214 09/20/19 1027 09/20/19 1200 09/21/19 0646 09/22/19 0619  GLUCAP 120* 80 84 98 88       Signed:  Nita Sells MD   [facility] 09/23/2019, 9:17 AM

## 2019-09-23 NOTE — Progress Notes (Signed)
Called daughter --no answer LM stating he clinical fair--Orthostatics repeat have improved--he will need adjustment of meds  As OP Would keep overnight to ensure stability as he "wasn't feeling good" with orthostatsis in the pm today, but likely would d/c am 9/25

## 2019-09-23 NOTE — TOC Transition Note (Signed)
Transition of Care [facility]) - CM/SW Discharge Note   Patient Details  Name: John [name] MRN: [id number] Date of Birth: [birth year]  Transition of Care [facility]) CM/SW Contact:  Pollie Friar, RN Phone Number: 09/23/2019, 3:12 PM   Clinical Narrative:    Plan is for patient to d/c home with resumption of [facility] services. Butch Penny with [facility] aware of orders and d/c. Family can provide supervision at home and transportation to home.    Final next level of care: Home w Home Health Services Barriers to Discharge: No Barriers Identified   Patient Goals and CMS Choice   CMS Medicare.gov Compare Post Acute Care list provided to:: Patient Choice offered to / list presented to : Patient  Discharge Placement                       Discharge Plan and Services   Discharge Planning Services: CM Consult Post Acute Care Choice: Home Health                               Social Determinants of Health (SDOH) Interventions     Readmission Risk Interventions Readmission Risk Prevention Plan 07/09/2019 04/30/2019 04/30/2019  Transportation Screening Complete - Complete  Medication Review Press photographer) Complete - Complete  PCP or Specialist appointment within 3-5 days of discharge - Complete -  [place] or Home Care Consult Complete - Complete  SW Recovery Care/Counseling Consult Complete - -  Palliative Care Screening Not Applicable - Not [place] Not Applicable - Not Applicable  Some recent data might be hidden

## 2019-09-24 NOTE — Discharge Summary (Addendum)
Physician Discharge Summary  John [name] MWN:[id number] DOB: April 06, 1946 DOA: 09/19/2019  PCP: [name], NP (Inactive)  Admit date: 09/19/2019 Discharge date: 9/25/2020updated on 9/25 with main change being stop Norvasc  Time spent: 45 minutes  Recommendations for Outpatient Follow-up:  1. Consider IV iron every [redacted] weeks along with discussion about IV iron stores in addition to checking the same 2. Get CBC plus differential, Chem-12, TSH, folate acid and B12 in the outpatient setting--consider this in 2 to 3 weeks from the time of discharge 3. No dosage changes of Seroquel from 25 twice daily to 25 once daily on discharge 4. Does need outpatient follow-up with his oncologist 5. Cut back norvasc 2/2 orthostasis 6. needs home health PT/ot on d/c home  Discharge Diagnoses:  Active Problems:   Chronic diastolic CHF (congestive heart failure) (HCC)   Diabetes mellitus (HCC)   HTN (hypertension)   Primary cancer of right lower lobe of lung (HCC)   CKD (chronic kidney disease), stage III (HCC)   Low hemoglobin   Syncope   Hypokalemia   Discharge Condition: Fair  Diet recommendation: Heart healthy  Filed Weights   09/20/19 2055 09/22/19 0600  Weight: 68.4 kg 70.2 kg    History of present illness:  73 year old black male community dwelling resident-recurrent NSCLC + chemo, HFpEF, COPD Gold 3, gout, DM TY 2-last A1c 4.6, vascular dementia,?  CAD, about 10 admissions over the past year Most recently 09/05/2019 anorexia malaise abdominal pain nausea vomiting and probable UGI bleed 2/2 duodenal ulcer-has had H. pylori in the past and also has had IR embolization of GDA 04/29/2019 Admit 9/20 syncopal episode?  Seizure with no definite postictal issues  Hospital Course:  Syncope: Prior history of recurrent syncope and  Had witnessed myoclonic movement eye rolling Presyncopal episode-family concerned about seizure. No incontinence or definitive postictal symptoms. EEG obtained  and showed to moderate diffuse encephalopathy. echo unremarkable.  Discussed with neurologist Dr Cheral Marker 9/21- since EEG no seizures doubt he has a seizure, likely cephalopathy in the setting of dementia. Chart review shows that patient has had acute metabolic encephalopathy in the setting of dementia back in August. Orthostatics + still so norvasc dc this admit  Acute metabolic encephalopathy in the setting of dementia, likely from medication/progression of dementia--cut back seroquel from 25 bid to 25 qd and more awake  History of orthostatic hypotension.  On previous admission beta-blocker clonidine and diuretics were discontinued.  Monitor orthostatic vitals here-see #1.  Hypokalemia: Improved.  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  Was on IV fluids transiently during hospital stay  Diabetes mellitus type QI:HKVQQV hemoglobin A1c 4.6.  Has poor appetite so oral meds were discontinued on last discharge on 9/2.    Blood sugars have been below 100 he will need to be supplemented with a diet going forward  ZDG:LOVFIE.On amlodipine 10 mg-this was DC on d/c home  Primary cancer of right lower lobe of lung, has completed SBRT for recurrent non-small cell lung cancer, outpatient follow-up with oncology when able  CKD stage PPI:RJJOACZYSA was elevated earlier in the admission but improved to baseline on discharge  Anemia of chronic disease/recent duodenal ulcer with upper GI bleed.  Continue PPI, monitor hemoglobin.  As an outpatient-hemoglobin on discharge was 8.3 He was given a dose of IV iron prior to discharge and may need this done in the outpatient setting in addition  Arthritis/knee pain, x-ray of the left knee shows " Severe tricompartmental arthrosis, with near total  medial compartment joint space loss", secondary to arthritis continue Tylenol pain control.  Metabolic acidosis-likely secondary to starvation ketosis versus AKI on  admission-patient CO2 was improved to 20 on discharge and this will need to be rechecked in the outpatient setting  Poor oral intake with lethargy excessive sleepiness daytime: DC'd a.m. Seroquel.Remeron added. Dietitian consulted.  Increase oral intake.  Continue his Megace.  Procedures: Multiple Consultations:  None  Discharge Exam: Vitals:   09/24/19 1000 09/24/19 1144  BP: (!) 90/46 (!) 110/54  Pulse: 68 72  Resp:  18  Temp:  97.7 F (36.5 C)  SpO2:      General: Awake alert slightly sleepy this morning no discomfort no icterus no pallor Cardiovascular: S1-S2 no murmur rub or gallop Respiratory: Clinically clear no added sound Abdomen soft nontender no rebound no guarding no lower extremity edema range of motion intact Gait was assessed and he was little bit hesitant  Discharge Instructions   Discharge Instructions    Diet - low sodium heart healthy   Complete by: As directed    Increase activity slowly   Complete by: As directed      Allergies as of 09/24/2019      Reactions   Levaquin [levofloxacin In D5w] Other (See Comments)   Unknown reaction   Lisinopril Swelling   Facial swelling      Medication List    TAKE these medications   acetaminophen 325 MG tablet Commonly known as: TYLENOL Take 2 tablets (650 mg total) by mouth every 6 (six) hours as needed for mild pain (or Fever >/= 101).   albuterol 108 (90 Base) MCG/ACT inhaler Commonly known as: ProAir HFA INHALE 2 PUFFS BY MOUTH EVERY 4 HOURS AS NEEDED FOR WHEEZING What changed:   how much to take  how to take this  when to take this  reasons to take this  additional instructions   allopurinol 300 MG tablet Commonly known as: ZYLOPRIM Take 300 mg by mouth daily.   amLODipine 2.5 MG tablet Commonly known as: NORVASC Take 1 tablet (2.5 mg total) by mouth daily. What changed:   medication strength  how much to take   atorvastatin 20 MG tablet Commonly known as: LIPITOR Take 1  tablet (20 mg total) by mouth daily.   budesonide-formoterol 160-4.5 MCG/ACT inhaler Commonly known as: SYMBICORT Inhale 2 puffs into the lungs 2 (two) times daily.   cyanocobalamin 1000 MCG tablet Take 1 tablet (1,000 mcg total) by mouth daily.   feeding supplement (ENSURE ENLIVE) Liqd Take 237 mLs by mouth 3 (three) times daily between meals.   ferrous sulfate 325 (65 FE) MG tablet Take 1 tablet (325 mg total) by mouth every Monday, Wednesday, and Friday.   folic acid 1 MG tablet Commonly known as: FOLVITE Take 1 tablet (1 mg total) by mouth daily.   Magnesium Oxide 200 MG Tabs Take 1 tablet (200 mg total) by mouth 2 (two) times daily.   megestrol 400 MG/10ML suspension Commonly known as: MEGACE Take 10 mLs (400 mg total) by mouth daily.   multivitamin with minerals Tabs tablet Take 1 tablet by mouth daily. Over the counter   pantoprazole 40 MG tablet Commonly known as: PROTONIX Take 1 tablet (40 mg total) by mouth 2 (two) times daily before a meal.   potassium chloride SA 20 MEQ tablet Commonly known as: K-DUR Take 1 tablet (20 mEq total) by mouth 2 (two) times daily.   QUEtiapine 25 MG tablet Commonly known as: SEROQUEL Take 1  tablet (25 mg total) by mouth 2 (two) times daily.   senna-docusate 8.6-50 MG tablet Commonly known as: Senokot-S Take 1 tablet by mouth at bedtime.   sucralfate 1 g tablet Commonly known as: CARAFATE Take 1 g by mouth 4 (four) times daily.   traMADol 50 MG tablet Commonly known as: ULTRAM Take 1 tablet (50 mg total) by mouth every 6 (six) hours as needed for moderate pain.   Vitamin D (Ergocalciferol) 1.25 MG (50000 UT) Caps capsule Commonly known as: DRISDOL Take 50,000 Units by mouth every Friday.   Vitamin D3 50 MCG (2000 UT) capsule Take 2,000 Units by mouth daily.      Allergies  Allergen Reactions  . Levaquin [Levofloxacin In D5w] Other (See Comments)    Unknown reaction  . Lisinopril Swelling    Facial swelling       The results of significant diagnostics from this hospitalization (including imaging, microbiology, ancillary and laboratory) are listed below for reference.    Significant Diagnostic Studies: Ct Abdomen Pelvis Wo Contrast  Result Date: 09/02/2019 CLINICAL DATA:  Abd pain, gastroenteritis or colitis suspected. Right lower lobe lung cancer. EXAM: CT ABDOMEN AND PELVIS WITHOUT CONTRAST TECHNIQUE: Multidetector CT imaging of the abdomen and pelvis was performed following the standard protocol without IV contrast. COMPARISON:  PET scan 05/26/2019 FINDINGS: Lower chest: Right lower lobe lung mass has slightly decreased in size, now measuring 4.6 x 3.7 cm. No new lesions are present. There is mild dependent atelectasis. Heart size is normal. Coronary artery calcifications are present. Hepatobiliary: The liver is unremarkable. Prominent gallstones are present in the neck of the gallbladder. No inflammatory changes are present. The common bile duct is within normal limits. Pancreas: Vascular coils are present in the pancreatic head, unchanged. Pancreas is otherwise unremarkable. Spleen: Normal in size without focal abnormality. Adrenals/Urinary Tract: Two upper pole right kidney stones are again seen, measuring up to 7 mm. These are both nonobstructing. A punctate nonobstructing stone is present at the lower pole of the right kidney. Exophytic cyst arising from the right kidney measures 4.5 cm, stable. Left kidney is within normal limits. Ureters are unremarkable. The urinary bladder is within normal limits. Stomach/Bowel: Minimal contrast is present in the stomach. The stomach and duodenum are otherwise unremarkable. Small bowel is within normal limits. Terminal ileum is normal. The ascending and transverse colon are within normal limits. Descending and sigmoid colon are normal. Vascular/Lymphatic: Atherosclerotic calcifications are present in the aorta and branch vessels. There is no aneurysm. Reproductive:  Calcifications are present within the prostate gland. Gland is normal in size. Other: No abdominal wall hernia or abnormality. No abdominopelvic ascites. Musculoskeletal: Multilevel degenerative changes are again noted in the lumbar spine. No new focal lytic or blastic lesions are present. Bony pelvis is within normal limits. Degenerative changes are noted again at the ischial tuberosities and both hips. IMPRESSION: 1. No acute or focal abnormality abdomen to explain the patient's abdominal pain. 2. Nonobstructing right-sided nephrolithiasis. 3.  Aortic Atherosclerosis (ICD10-I70.0). 4. Multilevel degenerative changes of the lumbar spine. 5. Coronary artery disease. 6. Slight decrease in size of known right lower lobe lung mass. Electronically Signed   By: San Morelle M.D.   On: 09/02/2019 05:04   Ct Knee Left Wo Contrast  Result Date: 08/25/2019 CLINICAL DATA:  Chronic knee swelling.  Fall this morning. EXAM: CT OF THE LEFT  KNEE WITHOUT CONTRAST TECHNIQUE: Multidetector CT imaging of the left knee was performed according to the standard protocol. Multiplanar CT image  reconstructions were also generated. COMPARISON:  Left knee x-rays from same day. FINDINGS: Bones/Joint/Cartilage No acute fracture or dislocation. Moderate joint effusion. Moderate to severe medial compartment joint space narrowing. Mild lateral and patellofemoral compartment joint space narrowing. Tricompartmental marginal osteophytes. Subchondral sclerosis in the medial compartment. Mild genu valgus deformity. Ligaments Suboptimally assessed by CT. Muscles and Tendons Grossly intact. Soft tissues Vascular calcifications.  No soft tissue mass or fluid collection. IMPRESSION: 1.  No acute osseous abnormality. 2. 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 3. Moderate joint effusion. Electronically Signed   By: Titus Dubin M.D.   On: 08/25/2019 18:23   Dg Knee Complete 4 Views Left  Result Date:  09/17/2019 CLINICAL DATA:  Fall, pain EXAM: LEFT KNEE - COMPLETE 4+ VIEW COMPARISON:  08/25/2019 FINDINGS: No fracture or dislocation of the left knee. Severe tricompartmental arthrosis, with near total medial compartment joint space loss. No knee joint effusion. Vascular calcinosis. IMPRESSION: No fracture or dislocation of the left knee. Severe tricompartmental arthrosis, with near total medial compartment joint space loss. Electronically Signed   By: Eddie Candle M.D.   On: 09/17/2019 15:55   Dg Abd Acute 2+v W 1v Chest  Result Date: 09/02/2019 CLINICAL DATA:  Nausea. EXAM: DG ABDOMEN ACUTE W/ 1V CHEST COMPARISON:  CT chest abdomen pelvis 04/21/2019 FINDINGS: Right lung base opacity corresponds to right lower lobe prior CT. Upper normal heart size. No evidence of acute airspace disease. No pleural fluid. No bowel dilatation to suggest obstruction. Embolization coils in the right upper quadrant. No free air. Right upper quadrant calcifications represent gallstones as well as right renal stones. Multilevel degenerative change in the spine. IMPRESSION: 1. Nonobstructive bowel gas pattern.  No free air. 2. Embolization coils in the right upper quadrant. 3. Right renal stones and gallstones. 4. Right lung base opacities correspond to right lung mass on prior CT. Electronically Signed   By: Keith Rake M.D.   On: 09/02/2019 03:02    Microbiology: Recent Results (from the past 240 hour(s))  SARS CORONAVIRUS 2 (TAT 6-24 HRS) Nasopharyngeal Nasopharyngeal Swab     Status: None   Collection Time: 09/19/19 10:05 PM   Specimen: Nasopharyngeal Swab  Result Value Ref Range Status   SARS Coronavirus 2 NEGATIVE NEGATIVE Final    Comment: (NOTE) SARS-CoV-2 target nucleic acids are NOT DETECTED. The SARS-CoV-2 RNA is generally detectable in upper and lower respiratory specimens during the acute phase of infection. Negative results do not preclude SARS-CoV-2 infection, do not rule out co-infections with other  pathogens, and should not be used as the sole basis for treatment or other patient management decisions. Negative results must be combined with clinical observations, patient history, and epidemiological information. The expected result is Negative. Fact Sheet for Patients: SugarRoll.be Fact Sheet for Healthcare Providers: https://www.woods-mathews.com/ This test is not yet approved or cleared by the [place] FDA and  has been authorized for detection and/or diagnosis of SARS-CoV-2 by FDA under an Emergency Use Authorization (EUA). This EUA will remain  in effect (meaning this test can be used) for the duration of the COVID-19 declaration under Section 56 4(b)(1) of the Act, 21 U.S.C. section 360bbb-3(b)(1), unless the authorization is terminated or revoked sooner. Performed at [facility], [place] [address] [place], [place] [address]      Labs: Basic Metabolic Panel: Recent Labs  Lab 09/19/19 2056 09/20/19 0333 09/21/19 0413 09/22/19 0416 09/23/19 0325  NA 133* 136 140 140 139  K 3.1* 3.6 3.5  3.5 3.8  CL 101 106 110 111 111  CO2 19* 19* 20* 19* 20*  GLUCOSE 138* 103* 94 93 95  BUN 13 14 9 8  7*  CREATININE 1.29* 1.17 1.06 1.00 1.12  CALCIUM 8.9 8.9 8.9 9.0 9.1   Liver Function Tests: No results for input(s): AST, ALT, ALKPHOS, BILITOT, PROT, ALBUMIN in the last 168 hours. No results for input(s): LIPASE, AMYLASE in the last 168 hours. No results for input(s): AMMONIA in the last 168 hours. CBC: Recent Labs  Lab 09/17/19 1514 09/19/19 2056 09/20/19 0333 09/21/19 0413 09/22/19 0416 09/23/19 0325  WBC 9.5 6.8 6.9 8.3 6.4 6.9  NEUTROABS 8.3*  --   --   --   --   --   HGB 9.9* 10.2* 9.7* 8.9* 8.9* 8.3*  HCT 30.8* 32.4* 30.9* 27.7* 27.8* 24.5*  MCV 96.3 98.5 97.5 96.2 95.5 94.6  PLT 383 440* 364 350 336 287   Cardiac Enzymes: No results for input(s): CKTOTAL, CKMB, CKMBINDEX, TROPONINI in the last 168  hours. BNP: BNP (last 3 results) Recent Labs    04/28/19 1529 09/01/19 2250 09/20/19 0333  BNP 49.5 42.3 38.9    ProBNP (last 3 results) No results for input(s): PROBNP in the last 8760 hours.  CBG: Recent Labs  Lab 09/19/19 2214 09/20/19 1027 09/20/19 1200 09/21/19 0646 09/22/19 0619  GLUCAP 120* 80 84 98 88       Signed:  Nita Sells MD   [facility] 09/24/2019, 2:26 PM

## 2019-09-24 NOTE — Progress Notes (Addendum)
Physical Therapy Treatment Patient Details Name: John [name] MRN: [id number] DOB: 1946/01/28 Today's Date: 09/24/2019    History of Present Illness John [name] is a 73 y.o. male admitted with possible seizure, history of recurrent non-small cell lung cancer received chemotherapy in the first week of August last month, diastolic CHF COPD diabetes mellitus, gout admitted with acute renal failure    PT Comments    Patient seen for mobility progression however limited by c/o dizziness and with orthostatic hypotension. Pt up in recliner upon arrival. Pt stood with min guard for safety and after BP taken pt repeating  "I can't" before sitting back down. PT will continue to follow acutely and progress as tolerated.   BP in sitting 108/66 (80)  BP in standing 79/49 (60) BP in sitting end of session 105/61 (75)   Follow Up Recommendations  Home health PT;Supervision/Assistance - 24 hour     Equipment Recommendations  None recommended by PT    Recommendations for Other Services       Precautions / Restrictions Precautions Precautions: Fall Restrictions Weight Bearing Restrictions: No    Mobility  Bed Mobility               General bed mobility comments: Pt OOB in chair upon arrival  Transfers Overall transfer level: Needs assistance Equipment used: Rolling walker (2 wheeled) Transfers: Sit to/from Stand Sit to Stand: Min guard         General transfer comment: min guard for safety; no physical assist required  Ambulation/Gait             General Gait Details: pt repeating "I can't" while in standing and sat back down declining attempts to ambulate due to c/o dizziness   Stairs             Wheelchair Mobility    Modified Rankin (Stroke Patients Only)       Balance Overall balance assessment: Needs assistance Sitting-balance support: No upper extremity supported;Feet supported Sitting balance-Leahy Scale: Good     Standing balance  support: Bilateral upper extremity supported Standing balance-Leahy Scale: Poor                              Cognition Arousal/Alertness: Awake/alert Behavior During Therapy: WFL for tasks assessed/performed Overall Cognitive Status: No family/caregiver present to determine baseline cognitive functioning                                 General Comments: h/o vascular dementia      Exercises  Ankle pumps: AROM; both Long Arc Quad: AROM; both Hip Flexion/Marching: AROM both  General Comments        Pertinent Vitals/Pain Pain Assessment: Faces Faces Pain Scale: Hurts little more Pain Location: left knee Pain Descriptors / Indicators: Guarding;Discomfort;Aching Pain Intervention(s): Limited activity within patient's tolerance;Monitored during session;Repositioned    Home Living                      Prior Function            PT Goals (current goals can now be found in the care plan section) Progress towards PT goals: Not progressing toward goals - comment(orthostatic)    Frequency    Min 3X/week      PT Plan Current plan remains appropriate    Co-evaluation  AM-PAC PT "6 Clicks" Mobility   Outcome Measure  Help needed turning from your back to your side while in a flat bed without using bedrails?: None Help needed moving from lying on your back to sitting on the side of a flat bed without using bedrails?: A Little Help needed moving to and from a bed to a chair (including a wheelchair)?: A Little Help needed standing up from a chair using your arms (e.g., wheelchair or bedside chair)?: A Little Help needed to walk in hospital room?: A Little Help needed climbing 3-5 steps with a railing? : A Little 6 Click Score: 19    End of Session Equipment Utilized During Treatment: Gait belt Activity Tolerance: Other (comment)(limited by c/o dizziness; orthostatic hypotension) Patient left: with call bell/phone within  reach;in chair;with chair alarm set Nurse Communication: Mobility status PT Visit Diagnosis: Muscle weakness (generalized) (M62.81);Difficulty in walking, not elsewhere classified (R26.2);Pain Pain - Right/Left: Left Pain - part of body: Knee     Time: 9507-2257 PT Time Calculation (min) (ACUTE ONLY): 15 min  Charges:  $Gait Training: 8-22 mins                     John [name], PTA Acute Rehabilitation Services Pager: [phone] Office: [phone]     John [name] 09/24/2019, 1:13 PM

## 2019-09-24 NOTE — Care Management Important Message (Signed)
Important Message  Patient Details  Name: John [name] MRN: [id number] Date of Birth: 1946-09-05   Medicare Important Message Given:  Yes     [name] 09/24/2019, 12:20 PM

## 2019-09-29 ENCOUNTER — Ambulatory Visit
Admission: RE | Admit: 2019-09-29 | Discharge: 2019-09-29 | Disposition: A | Discharge status: Home / Self Care | Payer: Medicare Other | Source: Ambulatory Visit | Attending: Urology | Admitting: Urology

## 2019-09-29 ENCOUNTER — Other Ambulatory Visit: Payer: Self-pay

## 2019-09-29 DIAGNOSIS — C3431 Malignant neoplasm of lower lobe, right bronchus or lung: Secondary | ICD-10-CM

## 2019-09-29 NOTE — Progress Notes (Signed)
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  06/26/2015 - 08/10/2015: Right Lower Lung + Involved Lymph Nodes / 66 Gy in 33 fractions  Narrative:  I spoke with the patient to conduct his routine scheduled 1 month follow up visit via telephone to spare the patient unnecessary potential exposure in the healthcare setting during the current COVID-19 pandemic.  The patient was notified in advance and gave permission to proceed with this visit format. He tolerated radiation treatment relatively well. He did experience some esophagitis characterized as mild and also noted fatigue.                              On review of systems, the patient states that he is doing very well overall.  He reports complete resolution of the dysphagia and has noticed gradual improvement in his energy level since completing his radiation.  He reports a healthy appetite and is maintaining his weight.  He denies abdominal pain, nausea, vomiting, diarrhea or constipation.  He denies any increased shortness of breath, productive cough, chest pain or hemoptysis.  Overall, he is quite pleased with his progress to date.  ALLERGIES:  is allergic to levaquin [levofloxacin in d5w] and lisinopril.  Meds: Current Outpatient Medications  Medication Sig Dispense Refill   acetaminophen (TYLENOL) 325 MG tablet Take 2 tablets (650 mg total) by mouth every 6 (six) hours as needed for mild pain (or Fever >/= 101).     albuterol (PROAIR HFA) 108 (90 BASE) MCG/ACT inhaler INHALE 2 PUFFS BY MOUTH  EVERY 4 HOURS AS NEEDED FOR WHEEZING (Patient taking differently: Inhale 2 puffs into the lungs every 4 (four) hours as needed for wheezing. ) 1 Inhaler 11   allopurinol (ZYLOPRIM) 300 MG tablet Take 300 mg by mouth daily.     amLODipine (NORVASC) 2.5 MG tablet Take 1 tablet (2.5 mg total) by mouth daily. 30 tablet 3   atorvastatin (LIPITOR) 20 MG tablet Take 1 tablet (20 mg total) by mouth daily. 30 tablet 0   budesonide-formoterol (SYMBICORT) 160-4.5 MCG/ACT inhaler Inhale 2 puffs into the lungs 2 (two) times daily.     Cholecalciferol (VITAMIN D3) 50 MCG (2000 UT) capsule Take 2,000 Units by mouth daily.      feeding supplement, ENSURE ENLIVE, (ENSURE ENLIVE) LIQD Take 237 mLs by mouth 3 (three) times daily between meals. 237 mL 12   ferrous sulfate 325 (65 FE) MG tablet Take 1 tablet (325 mg total) by mouth every Monday, Wednesday, and Friday. 30 tablet 2   folic acid (FOLVITE) 1 MG tablet Take 1 tablet (1 mg total) by mouth daily. 30 tablet 11   Magnesium Oxide 200 MG TABS Take 1 tablet (200 mg total) by mouth 2 (two) times daily. 60 tablet 0   megestrol (MEGACE) 400 MG/10ML suspension Take 10 mLs (400 mg total) by mouth daily. 240 mL 1   Multiple Vitamin (MULTIVITAMIN WITH MINERALS) TABS tablet Take 1 tablet by mouth daily. Over  the counter 30 tablet 3   pantoprazole (PROTONIX) 40 MG tablet Take 1 tablet (40 mg total) by mouth 2 (two) times daily before a meal. 180 tablet 3   potassium chloride SA (K-DUR) 20 MEQ tablet Take 1 tablet (20 mEq total) by mouth 2 (two) times daily. 14 tablet 0   QUEtiapine (SEROQUEL) 25 MG tablet Take 1 tablet (25 mg total) by mouth 2 (two) times daily. 60 tablet 0   senna-docusate (SENOKOT-S) 8.6-50 MG tablet Take 1 tablet by mouth at bedtime. 30 tablet 0   sucralfate (CARAFATE) 1 g tablet Take 1 g by mouth 4 (four) times daily.      traMADol (ULTRAM) 50 MG tablet Take 1 tablet (50 mg total) by mouth every 6 (six) hours as needed for moderate  pain. 15 tablet 0   vitamin B-12 1000 MCG tablet Take 1 tablet (1,000 mcg total) by mouth daily. 30 tablet 3   Vitamin D, Ergocalciferol, (DRISDOL) 1.25 MG (50000 UT) CAPS capsule Take 50,000 Units by mouth every Friday.      No current facility-administered medications for this encounter.     Physical Findings:  vitals were not taken for this visit.   /Unable to assess due to telephone follow-up visit format.  Lab Findings: Lab Results  Component Value Date   WBC 6.9 09/23/2019   HGB 8.3 (L) 09/23/2019   HCT 24.5 (L) 09/23/2019   MCV 94.6 09/23/2019   PLT 287 09/23/2019     Radiographic Findings: Ct Abdomen Pelvis Wo Contrast  Result Date: 09/02/2019 CLINICAL DATA:  Abd pain, gastroenteritis or colitis suspected. Right lower lobe lung cancer. EXAM: CT ABDOMEN AND PELVIS WITHOUT CONTRAST TECHNIQUE: Multidetector CT imaging of the abdomen and pelvis was performed following the standard protocol without IV contrast. COMPARISON:  PET scan 05/26/2019 FINDINGS: Lower chest: Right lower lobe lung mass has slightly decreased in size, now measuring 4.6 x 3.7 cm. No new lesions are present. There is mild dependent atelectasis. Heart size is normal. Coronary artery calcifications are present. Hepatobiliary: The liver is unremarkable. Prominent gallstones are present in the neck of the gallbladder. No inflammatory changes are present. The common bile duct is within normal limits. Pancreas: Vascular coils are present in the pancreatic head, unchanged. Pancreas is otherwise unremarkable. Spleen: Normal in size without focal abnormality. Adrenals/Urinary Tract: Two upper pole right kidney stones are again seen, measuring up to 7 mm. These are both nonobstructing. A punctate nonobstructing stone is present at the lower pole of the right kidney. Exophytic cyst arising from the right kidney measures 4.5 cm, stable. Left kidney is within normal limits. Ureters are unremarkable. The urinary bladder is within  normal limits. Stomach/Bowel: Minimal contrast is present in the stomach. The stomach and duodenum are otherwise unremarkable. Small bowel is within normal limits. Terminal ileum is normal. The ascending and transverse colon are within normal limits. Descending and sigmoid colon are normal. Vascular/Lymphatic: Atherosclerotic calcifications are present in the aorta and branch vessels. There is no aneurysm. Reproductive: Calcifications are present within the prostate gland. Gland is normal in size. Other: No abdominal wall hernia or abnormality. No abdominopelvic ascites. Musculoskeletal: Multilevel degenerative changes are again noted in the lumbar spine. No new focal lytic or blastic lesions are present. Bony pelvis is within normal limits. Degenerative changes are noted again at the ischial tuberosities and both hips. IMPRESSION: 1. No acute or focal abnormality abdomen to explain the patient's abdominal pain. 2. Nonobstructing right-sided nephrolithiasis. 3.  Aortic Atherosclerosis (ICD10-I70.0). 4. Multilevel  degenerative changes of the lumbar spine. 5. Coronary artery disease. 6. Slight decrease in size of known right lower lobe lung mass. Electronically Signed   By: San Morelle M.D.   On: 09/02/2019 05:04   Dg Knee Complete 4 Views Left  Result Date: 09/17/2019 CLINICAL DATA:  Fall, pain EXAM: LEFT KNEE - COMPLETE 4+ VIEW COMPARISON:  08/25/2019 FINDINGS: No fracture or dislocation of the left knee. Severe tricompartmental arthrosis, with near total medial compartment joint space loss. No knee joint effusion. Vascular calcinosis. IMPRESSION: No fracture or dislocation of the left knee. Severe tricompartmental arthrosis, with near total medial compartment joint space loss. Electronically Signed   By: Eddie Candle M.D.   On: 09/17/2019 15:55   Dg Abd Acute 2+v W 1v Chest  Result Date: 09/02/2019 CLINICAL DATA:  Nausea. EXAM: DG ABDOMEN ACUTE W/ 1V CHEST COMPARISON:  CT chest abdomen pelvis  04/21/2019 FINDINGS: Right lung base opacity corresponds to right lower lobe prior CT. Upper normal heart size. No evidence of acute airspace disease. No pleural fluid. No bowel dilatation to suggest obstruction. Embolization coils in the right upper quadrant. No free air. Right upper quadrant calcifications represent gallstones as well as right renal stones. Multilevel degenerative change in the spine. IMPRESSION: 1. Nonobstructive bowel gas pattern.  No free air. 2. Embolization coils in the right upper quadrant. 3. Right renal stones and gallstones. 4. Right lung base opacities correspond to right lung mass on prior CT. Electronically Signed   By: Keith Rake M.D.   On: 09/02/2019 03:02    Impression/Plan: 1. 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 He appears to be recovering well from the effects of his recent radiation and is currently without complaints.  We discussed that while we are happy to continue to participate in his care, at this point, we will plan to see him on an as needed basis.  He will continue in routine follow up with Dr. Julien Nordmann for continued evaluation and management of his systemic disease and is scheduled for follow up on 12/06/19 with a repeat CT Chest prior to that visit. He knows to call with any questions or concerns related to his prior radiation.    Nicholos Johns, PA-C

## 2019-10-01 ENCOUNTER — Other Ambulatory Visit: Payer: Medicare Other

## 2019-10-04 ENCOUNTER — Other Ambulatory Visit: Payer: Medicare Other | Admitting: Hospice

## 2019-10-04 ENCOUNTER — Other Ambulatory Visit: Payer: Self-pay

## 2019-10-04 ENCOUNTER — Ambulatory Visit: Payer: Medicare Other | Admitting: Internal Medicine

## 2019-10-04 DIAGNOSIS — Z515 Encounter for palliative care: Secondary | ICD-10-CM

## 2019-10-04 NOTE — Progress Notes (Signed)
[facility] Pall[facility] Consult Note Telephone: [phone]  Fax: [phone]  TELEHEALTH VISIT STATEMENT W/O TECH Due to the COVID-19 crisis, this visit was done via telemedicine and it was initiated and consent by this patient and or family. Video-audio (telehealth) contact was unable to be done due to technical barriers from the patient's side.  PATIENT NAME: John [name] DOB: [birth year] MRN: [id number]  PRIMARY CARE PROVIDER:   [name], NP  REFERRING PROVIDER:  [name], NP [address] [place],  [place] [address]  RESPONSIBLE PARTY:   Self Emergency contact, Isiah Scheel - daughter- [phone]  RECOMMENDATIONS/PLAN:  1. Advance Care Planning/Goals of Care: Goals include to maximize quality of life and symptom management. Visit consisted of discussions on Palliative Medicine as specialized medical care for people living with serious illness, aimed at facilitating advance care plan, symptoms relief and establishing goals of care. Educated on importance of advance directive, code status, end of life wishes with chronic progressive disease. Patient elected DNR, signed and mailed out to him; uploaded in [place]. MOST form discussion to continue in next scheduled visit. 2. Symptom management: No respiratory distress/discomfort related to RLL lung CA. Patient reported no pain, SOB, coughing; he also said he has completed Radiation and will continue to follow up with Oncology as needed.   3. Follow up Palliative Care Visit: Palliative care will continue to follow for goals of care clarification and symptom management.   I spent 35 minutes providing this consultation, from 3.30pm to 4.05 pm. More than 50% of the time in this consultation was spent on coordinating advance care communication.  HISTORY OF PRESENT ILLNESS:  John [name] is a 73 y.o. year old male with multiple medical problems including Right lower lober lung CA,  Diastolic CHF, HTN, CKD 3, DM Palliative Care was asked to help address goals of care.   CODE STATUS: DNR  PPS: 50% HOSPICE ELIGIBILITY/DIAGNOSIS: TBD  PAST MEDICAL HISTORY:  Past Medical History:  Diagnosis Date  . B12 deficiency   . CHF (congestive heart failure) ([place])   . COPD (chronic obstructive pulmonary disease) ([place])   . Diabetes mellitus without complication ([place])   . Folate deficiency   . Gout   . Hyperlipemia   . Hypertension   . Lung cancer ([place])    squamous cell carcinoma of right lower lobe lung  . MI (myocardial infarction) ([place])   . Pulmonary hypertension ([place])     SOCIAL HX:  Social History   Tobacco Use  . Smoking status: Former Smoker    Packs/day: 1.50    Years: 60.00    Pack years: 90.00    Types: Cigarettes    Quit date: 09/29/2012    Years since quitting: 7.0  . Smokeless tobacco: Never Used  Substance Use Topics  . Alcohol use: No    Alcohol/week: 0.0 standard drinks    ALLERGIES:  Allergies  Allergen Reactions  . Levaquin [Levofloxacin In D5w] Other (See Comments)    Unknown reaction  . Lisinopril Swelling    Facial swelling     PERTINENT MEDICATIONS:  Outpatient Encounter Medications as of 10/04/2019  Medication Sig  . acetaminophen (TYLENOL) 325 MG tablet Take 2 tablets (650 mg total) by mouth every 6 (six) hours as needed for mild pain (or Fever >/= 101).  Marland Kitchen albuterol (PROAIR HFA) 108 (90 BASE) MCG/ACT inhaler INHALE 2 PUFFS BY MOUTH EVERY 4 HOURS AS NEEDED FOR WHEEZING (Patient taking differently: Inhale 2 puffs into  the lungs every 4 (four) hours as needed for wheezing. )  . allopurinol (ZYLOPRIM) 300 MG tablet Take 300 mg by mouth daily.  Marland Kitchen amLODipine (NORVASC) 2.5 MG tablet Take 1 tablet (2.5 mg total) by mouth daily.  Marland Kitchen atorvastatin (LIPITOR) 20 MG tablet Take 1 tablet (20 mg total) by mouth daily.  . budesonide-formoterol (SYMBICORT) 160-4.5 MCG/ACT inhaler Inhale 2 puffs into the lungs 2 (two) times daily.  . Cholecalciferol  (VITAMIN D3) 50 MCG (2000 UT) capsule Take 2,000 Units by mouth daily.   . feeding supplement, ENSURE ENLIVE, (ENSURE ENLIVE) LIQD Take 237 mLs by mouth 3 (three) times daily between meals.  . ferrous sulfate 325 (65 FE) MG tablet Take 1 tablet (325 mg total) by mouth every Monday, Wednesday, and Friday.  . folic acid (FOLVITE) 1 MG tablet Take 1 tablet (1 mg total) by mouth daily.  . Magnesium Oxide 200 MG TABS Take 1 tablet (200 mg total) by mouth 2 (two) times daily.  . megestrol (MEGACE) 400 MG/10ML suspension Take 10 mLs (400 mg total) by mouth daily.  . Multiple Vitamin (MULTIVITAMIN WITH MINERALS) TABS tablet Take 1 tablet by mouth daily. Over the counter  . pantoprazole (PROTONIX) 40 MG tablet Take 1 tablet (40 mg total) by mouth 2 (two) times daily before a meal.  . potassium chloride SA (K-DUR) 20 MEQ tablet Take 1 tablet (20 mEq total) by mouth 2 (two) times daily.  . QUEtiapine (SEROQUEL) 25 MG tablet Take 1 tablet (25 mg total) by mouth 2 (two) times daily.  Marland Kitchen senna-docusate (SENOKOT-S) 8.6-50 MG tablet Take 1 tablet by mouth at bedtime.  . sucralfate (CARAFATE) 1 g tablet Take 1 g by mouth 4 (four) times daily.   . traMADol (ULTRAM) 50 MG tablet Take 1 tablet (50 mg total) by mouth every 6 (six) hours as needed for moderate pain.  . vitamin B-12 1000 MCG tablet Take 1 tablet (1,000 mcg total) by mouth daily.  . Vitamin D, Ergocalciferol, (DRISDOL) 1.25 MG (50000 UT) CAPS capsule Take 50,000 Units by mouth every Friday.    No facility-administered encounter medications on file as of 10/04/2019.     Teodoro Spray, NP

## 2019-10-13 ENCOUNTER — Other Ambulatory Visit: Payer: Medicare Other | Admitting: Hospice

## 2019-10-13 ENCOUNTER — Other Ambulatory Visit: Payer: Self-pay

## 2019-10-13 DIAGNOSIS — Z515 Encounter for palliative care: Secondary | ICD-10-CM

## 2019-10-13 NOTE — Progress Notes (Signed)
[facility] Pall[facility] Consult Note Telephone: [phone]  Fax: [phone]  PATIENT NAME: John [name] DOB: 09-Aug-1946 MRN: [id number]  PRIMARY CARE PROVIDER:   [name], NP  REFERRING PROVIDER:  [name], NP [address] [place],  [place] [address]  RESPONSIBLE PARTY:   Self Emergency contact, Melvern Ramone - daughter- [phone] ASSESSMENT:       RECOMMENDATIONS/PLAN:  1. Advance Care Planning/Goals of Care: Visit consisted of building trust, advance care planning and symptom management. Goals include to maximize quality of life and symptom management. MOST form discussion to continue in next scheduled visit. Lynette plan to be present 2. Symptom management: Patient walked to the driveway to welcome NP. No respiratory distress/discomfort related to RLL lung CA. Patient reported no pain, SOB, coughing.  3. Follow up Palliative Care Visit: Palliative care will continue to follow for goals of care clarification and symptom management.   I spent 35 minutes providing this consultation, from 11am to 11.35am. More than 50% of the time in this consultation was spent on coordinating advance care communication.  HISTORY OF PRESENT ILLNESS:  John [name] is a 73 y.o. year old male with multiple medical problems including Right lower lober lung CA, Diastolic CHF, HTN, CKD 3, DM Palliative Care was asked to help address goals of care CODE STATUS: DNR  PPS: 50% HOSPICE ELIGIBILITY/DIAGNOSIS: TBD  PAST MEDICAL HISTORY:  Past Medical History:  Diagnosis Date  . B12 deficiency   . CHF (congestive heart failure) ([facility] [place])   . COPD (chronic obstructive pulmonary disease) ([facility])   . Diabetes mellitus without complication ([facility])   . Folate deficiency   . Gout   . Hyperlipemia   . Hypertension   . Lung cancer ([facility])    squamous cell carcinoma of right lower lobe lung  . MI (myocardial infarction) ([place])   . Pulmonary  hypertension ([place])     SOCIAL HX:  Social History   Tobacco Use  . Smoking status: Former Smoker    Packs/day: 1.50    Years: 60.00    Pack years: 90.00    Types: Cigarettes    Quit date: 09/29/2012    Years since quitting: 7.0  . Smokeless tobacco: Never Used  Substance Use Topics  . Alcohol use: No    Alcohol/week: 0.0 standard drinks    ALLERGIES:  Allergies  Allergen Reactions  . Levaquin [Levofloxacin In D5w] Other (See Comments)    Unknown reaction  . Lisinopril Swelling    Facial swelling     PERTINENT MEDICATIONS:  Outpatient Encounter Medications as of 10/13/2019  Medication Sig  . acetaminophen (TYLENOL) 325 MG tablet Take 2 tablets (650 mg total) by mouth every 6 (six) hours as needed for mild pain (or Fever >/= 101).  Marland Kitchen albuterol (PROAIR HFA) 108 (90 BASE) MCG/ACT inhaler INHALE 2 PUFFS BY MOUTH EVERY 4 HOURS AS NEEDED FOR WHEEZING (Patient taking differently: Inhale 2 puffs into the lungs every 4 (four) hours as needed for wheezing. )  . allopurinol (ZYLOPRIM) 300 MG tablet Take 300 mg by mouth daily.  Marland Kitchen amLODipine (NORVASC) 2.5 MG tablet Take 1 tablet (2.5 mg total) by mouth daily.  Marland Kitchen atorvastatin (LIPITOR) 20 MG tablet Take 1 tablet (20 mg total) by mouth daily.  . budesonide-formoterol (SYMBICORT) 160-4.5 MCG/ACT inhaler Inhale 2 puffs into the lungs 2 (two) times daily.  . Cholecalciferol (VITAMIN D3) 50 MCG (2000 UT) capsule Take 2,000 Units by mouth daily.   . feeding supplement,  ENSURE ENLIVE, (ENSURE ENLIVE) LIQD Take 237 mLs by mouth 3 (three) times daily between meals.  . ferrous sulfate 325 (65 FE) MG tablet Take 1 tablet (325 mg total) by mouth every Monday, Wednesday, and Friday.  . folic acid (FOLVITE) 1 MG tablet Take 1 tablet (1 mg total) by mouth daily.  . Magnesium Oxide 200 MG TABS Take 1 tablet (200 mg total) by mouth 2 (two) times daily.  . megestrol (MEGACE) 400 MG/10ML suspension Take 10 mLs (400 mg total) by mouth daily.  . Multiple  Vitamin (MULTIVITAMIN WITH MINERALS) TABS tablet Take 1 tablet by mouth daily. Over the counter  . pantoprazole (PROTONIX) 40 MG tablet Take 1 tablet (40 mg total) by mouth 2 (two) times daily before a meal.  . potassium chloride SA (K-DUR) 20 MEQ tablet Take 1 tablet (20 mEq total) by mouth 2 (two) times daily.  . QUEtiapine (SEROQUEL) 25 MG tablet Take 1 tablet (25 mg total) by mouth 2 (two) times daily.  Marland Kitchen senna-docusate (SENOKOT-S) 8.6-50 MG tablet Take 1 tablet by mouth at bedtime.  . sucralfate (CARAFATE) 1 g tablet Take 1 g by mouth 4 (four) times daily.   . traMADol (ULTRAM) 50 MG tablet Take 1 tablet (50 mg total) by mouth every 6 (six) hours as needed for moderate pain.  . vitamin B-12 1000 MCG tablet Take 1 tablet (1,000 mcg total) by mouth daily.  . Vitamin D, Ergocalciferol, (DRISDOL) 1.25 MG (50000 UT) CAPS capsule Take 50,000 Units by mouth every Friday.    No facility-administered encounter medications on file as of 10/13/2019.     ROS/PHYSICAL EXAM:   General: Pleasant, in no acute distress Cardiovascular: regular rate and rhythm Pulmonary: denied SOB, no coughing. Normal respiratory effort Abdomen: soft, nontender, + bowel sounds GU: no suprapubic tenderness Extremities: no edema, no joint deformities Skin: no rashes/wounds to exposed skin Neurological: Weakness but otherwise nonfocal  Teodoro Spray, NP

## 2019-10-19 ENCOUNTER — Other Ambulatory Visit: Payer: Medicare Other | Admitting: Hospice

## 2019-10-19 ENCOUNTER — Other Ambulatory Visit: Payer: Self-pay

## 2019-10-19 DIAGNOSIS — Z515 Encounter for palliative care: Secondary | ICD-10-CM

## 2019-10-19 NOTE — Progress Notes (Signed)
[facility] Consult Note Telephone: [phone]  Fax: [phone]  PATIENT NAME: John [name] DOB: February 02, 1946 MRN: [id number]  PRIMARY CARE PROVIDER:   [name], NP  REFERRING PROVIDER:  [name], NP [address] [place],  [place] [address]  RESPONSIBLE PARTY:Self Emergency contact, Conan Bowens - daughter      RECOMMENDATIONS/PLAN: 1. Advance Care Planning/Goals of Care:  Daughter is present with patient during visit today. This meeting was scheduled at her instance to further discuss MOST form and answer her questions. MOST form reviewed.  Family verbalized understanding of the review of MOST form; all questions answered. Patient's selections include No CPR, limited additional intervention, use of antibiotics/IV fluids as indicated and use of feeding tube. Goals include to maximize quality of life and symptom management. DNR and MOST forms are uploaded in [facility]  2. Symptom management:He denied pain/discomfort respiratory distress/discomfort related to RLL lung CA. Patient reported no pain, SOB, coughing.  3. Follow up Palliative Care Visit: Palliative care will continue to follow for goals of care clarification and symptom management.   I spent68minutes providing this consultation, from3pm  to 4pm. More than 50% of the time in this consultation was spent on coordinating advance care communication.  HISTORY OF PRESENT ILLNESS:Jaimen E Mooreis a 73 y.o.year oldmalewith multiple medical problems including Right lower lober lung CA, Diastolic CHF, HTN, CKD 3, DMPalliative Care was asked to help address goals of care  CODE STATUS: DNR  PPS: 50% HOSPICE ELIGIBILITY/DIAGNOSIS: TBD  PAST MEDICAL HISTORY:  Past Medical History:  Diagnosis Date  . B12 deficiency   . CHF (congestive heart failure) ([facility])   . COPD (chronic obstructive pulmonary disease) ([facility])   . Diabetes mellitus without  complication ([facility])   . Folate deficiency   . Gout   . Hyperlipemia   . Hypertension   . 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 MI (myocardial infarction) ([facility])   . Pulmonary hypertension ([facility])     SOCIAL HX:  Social History   Tobacco Use  . Smoking status: Former Smoker    Packs/day: 1.50    Years: 60.00    Pack years: 90.00    Types: Cigarettes    Quit date: 09/29/2012    Years since quitting: 7.0  . Smokeless tobacco: Never Used  Substance Use Topics  . Alcohol use: No    Alcohol/week: 0.0 standard drinks    ALLERGIES:  Allergies  Allergen Reactions  . Levaquin [Levofloxacin In D5w] Other (See Comments)    Unknown reaction  . Lisinopril Swelling    Facial swelling     PERTINENT MEDICATIONS:  Outpatient Encounter Medications as of 10/19/2019  Medication Sig  . acetaminophen (TYLENOL) 325 MG tablet Take 2 tablets (650 mg total) by mouth every 6 (six) hours as needed for mild pain (or Fever >/= 101).  Marland Kitchen albuterol (PROAIR HFA) 108 (90 BASE) MCG/ACT inhaler INHALE 2 PUFFS BY MOUTH EVERY 4 HOURS AS NEEDED FOR WHEEZING (Patient taking differently: Inhale 2 puffs into the lungs every 4 (four) hours as needed for wheezing. )  . allopurinol (ZYLOPRIM) 300 MG tablet Take 300 mg by mouth daily.  Marland Kitchen amLODipine (NORVASC) 2.5 MG tablet Take 1 tablet (2.5 mg total) by mouth daily.  Marland Kitchen atorvastatin (LIPITOR) 20 MG tablet Take 1 tablet (20 mg total) by mouth daily.  . budesonide-formoterol (SYMBICORT) 160-4.5 MCG/ACT inhaler Inhale 2 puffs into the lungs 2 (two) times  daily.  . Cholecalciferol (VITAMIN D3) 50 MCG (2000 UT) capsule Take 2,000 Units by mouth daily.   . feeding supplement, ENSURE ENLIVE, (ENSURE ENLIVE) LIQD Take 237 mLs by mouth 3 (three) times daily between meals.  . ferrous sulfate 325 (65 FE) MG tablet Take 1 tablet (325 mg total) by mouth every Monday, Wednesday, and Friday.  . folic acid (FOLVITE) 1 MG tablet Take 1 tablet (1 mg total)  by mouth daily.  . Magnesium Oxide 200 MG TABS Take 1 tablet (200 mg total) by mouth 2 (two) times daily.  . megestrol (MEGACE) 400 MG/10ML suspension Take 10 mLs (400 mg total) by mouth daily.  . Multiple Vitamin (MULTIVITAMIN WITH MINERALS) TABS tablet Take 1 tablet by mouth daily. Over the counter  . pantoprazole (PROTONIX) 40 MG tablet Take 1 tablet (40 mg total) by mouth 2 (two) times daily before a meal.  . potassium chloride SA (K-DUR) 20 MEQ tablet Take 1 tablet (20 mEq total) by mouth 2 (two) times daily.  . QUEtiapine (SEROQUEL) 25 MG tablet Take 1 tablet (25 mg total) by mouth 2 (two) times daily.  Marland Kitchen senna-docusate (SENOKOT-S) 8.6-50 MG tablet Take 1 tablet by mouth at bedtime.  . sucralfate (CARAFATE) 1 g tablet Take 1 g by mouth 4 (four) times daily.   . traMADol (ULTRAM) 50 MG tablet Take 1 tablet (50 mg total) by mouth every 6 (six) hours as needed for moderate pain.  . vitamin B-12 1000 MCG tablet Take 1 tablet (1,000 mcg total) by mouth daily.  . Vitamin D, Ergocalciferol, (DRISDOL) 1.25 MG (50000 UT) CAPS capsule Take 50,000 Units by mouth every Friday.    No facility-administered encounter medications on file as of 10/19/2019.    ROS/PHYSICAL EXAM:   General: Pleasant, in no acute distress Cardiovascular: regular rate and rhythm Pulmonary: denied SOB, no coughing. Normal respiratory effort Abdomen: soft, nontender, + bowel sounds GU: no suprapubic tenderness Extremities: no edema, no joint deformities Skin: no rashes/wounds to exposed skin Neurological: Weakness but otherwise nonfocal  Teodoro Spray, NP

## 2019-11-01 ENCOUNTER — Encounter: Payer: Self-pay | Admitting: Neurology

## 2019-11-01 ENCOUNTER — Telehealth: Payer: Self-pay

## 2019-11-01 ENCOUNTER — Telehealth (INDEPENDENT_AMBULATORY_CARE_PROVIDER_SITE_OTHER): Payer: Medicare Other | Admitting: Neurology

## 2019-11-01 ENCOUNTER — Other Ambulatory Visit: Payer: Self-pay

## 2019-11-01 NOTE — Telephone Encounter (Signed)
Virtual visit today has to be rescheduled. Pt is unable to initiated virtual visit on his own.  Left message for his daughter Willette Cluster to call the office back.  Offered 11/24/19 at 1:00 per Dr Delice Lesch for another virtual visit. Daughter will need to be present at visit.

## 2019-11-08 NOTE — Progress Notes (Signed)
Patient could not participate with virtual visit without assistance, daughter called to r/s and be present during visit.

## 2019-11-24 ENCOUNTER — Other Ambulatory Visit: Payer: Self-pay

## 2019-11-24 ENCOUNTER — Telehealth: Payer: Medicare Other | Admitting: Neurology

## 2019-11-30 ENCOUNTER — Telehealth: Payer: Medicare Other | Admitting: Neurology

## 2019-11-30 ENCOUNTER — Other Ambulatory Visit: Payer: Self-pay

## 2019-11-30 ENCOUNTER — Encounter: Payer: Self-pay | Admitting: Neurology

## 2019-11-30 NOTE — Progress Notes (Signed)
Unable to connect via video. Daughter agrees to in-person evaluation, this will be rescheduled to in-person office visit.

## 2019-12-03 ENCOUNTER — Inpatient Hospital Stay: Payer: Medicare Other | Attending: Internal Medicine

## 2019-12-03 ENCOUNTER — Other Ambulatory Visit: Payer: Self-pay

## 2019-12-03 DIAGNOSIS — C3431 Malignant neoplasm of lower lobe, right bronchus or lung: Secondary | ICD-10-CM

## 2019-12-03 DIAGNOSIS — E785 Hyperlipidemia, unspecified: Secondary | ICD-10-CM | POA: Diagnosis not present

## 2019-12-03 DIAGNOSIS — Z7951 Long term (current) use of inhaled steroids: Secondary | ICD-10-CM | POA: Diagnosis not present

## 2019-12-03 DIAGNOSIS — Z79899 Other long term (current) drug therapy: Secondary | ICD-10-CM | POA: Insufficient documentation

## 2019-12-03 DIAGNOSIS — I252 Old myocardial infarction: Secondary | ICD-10-CM | POA: Diagnosis not present

## 2019-12-03 DIAGNOSIS — J449 Chronic obstructive pulmonary disease, unspecified: Secondary | ICD-10-CM | POA: Diagnosis not present

## 2019-12-03 DIAGNOSIS — I10 Essential (primary) hypertension: Secondary | ICD-10-CM | POA: Insufficient documentation

## 2019-12-03 DIAGNOSIS — Z923 Personal history of irradiation: Secondary | ICD-10-CM | POA: Insufficient documentation

## 2019-12-03 DIAGNOSIS — E119 Type 2 diabetes mellitus without complications: Secondary | ICD-10-CM | POA: Insufficient documentation

## 2019-12-03 LAB — CBC WITH DIFFERENTIAL (CANCER CENTER ONLY)
Abs Immature Granulocytes: 0.04 10*3/uL (ref 0.00–0.07)
Basophils Absolute: 0 10*3/uL (ref 0.0–0.1)
Basophils Relative: 0 %
Eosinophils Absolute: 0.2 10*3/uL (ref 0.0–0.5)
Eosinophils Relative: 2 %
HCT: 29.6 % — ABNORMAL LOW (ref 39.0–52.0)
Hemoglobin: 9.3 g/dL — ABNORMAL LOW (ref 13.0–17.0)
Immature Granulocytes: 1 %
Lymphocytes Relative: 11 %
Lymphs Abs: 0.9 10*3/uL (ref 0.7–4.0)
MCH: 32 pg (ref 26.0–34.0)
MCHC: 31.4 g/dL (ref 30.0–36.0)
MCV: 101.7 fL — ABNORMAL HIGH (ref 80.0–100.0)
Monocytes Absolute: 0.8 10*3/uL (ref 0.1–1.0)
Monocytes Relative: 10 %
Neutro Abs: 5.9 10*3/uL (ref 1.7–7.7)
Neutrophils Relative %: 76 %
Platelet Count: 217 10*3/uL (ref 150–400)
RBC: 2.91 MIL/uL — ABNORMAL LOW (ref 4.22–5.81)
RDW: 15.4 % (ref 11.5–15.5)
WBC Count: 7.8 10*3/uL (ref 4.0–10.5)
nRBC: 0 % (ref 0.0–0.2)

## 2019-12-03 LAB — CMP (CANCER CENTER ONLY)
ALT: <6 U/L (ref 0–44)
AST: 9 U/L — ABNORMAL LOW (ref 15–41)
Albumin: 3 g/dL — ABNORMAL LOW (ref 3.5–5.0)
Alkaline Phosphatase: 103 U/L (ref 38–126)
Anion gap: 9 (ref 5–15)
BUN: 12 mg/dL (ref 8–23)
CO2: 33 mmol/L — ABNORMAL HIGH (ref 22–32)
Calcium: 8.9 mg/dL (ref 8.9–10.3)
Chloride: 104 mmol/L (ref 98–111)
Creatinine: 1.3 mg/dL — ABNORMAL HIGH (ref 0.61–1.24)
GFR, Est AFR Am: >60 mL/min (ref >60)
GFR, Estimated: 54 mL/min — ABNORMAL LOW (ref >60)
Glucose, Bld: 96 mg/dL (ref 70–99)
Potassium: 3.4 mmol/L — ABNORMAL LOW (ref 3.5–5.1)
Sodium: 146 mmol/L — ABNORMAL HIGH (ref 135–145)
Total Bilirubin: 0.3 mg/dL (ref 0.3–1.2)
Total Protein: 6.3 g/dL — ABNORMAL LOW (ref 6.5–8.1)

## 2019-12-06 ENCOUNTER — Encounter: Payer: Self-pay | Admitting: Internal Medicine

## 2019-12-06 ENCOUNTER — Inpatient Hospital Stay (HOSPITAL_BASED_OUTPATIENT_CLINIC_OR_DEPARTMENT_OTHER): Payer: Medicare Other | Admitting: Internal Medicine

## 2019-12-06 ENCOUNTER — Telehealth: Payer: Self-pay

## 2019-12-06 ENCOUNTER — Other Ambulatory Visit: Payer: Self-pay

## 2019-12-06 VITALS — BP 112/60 | HR 75 | Temp 98.7°F | Resp 18 | Ht 69.0 in | Wt 167.3 lb

## 2019-12-06 DIAGNOSIS — C3431 Malignant neoplasm of lower lobe, right bronchus or lung: Secondary | ICD-10-CM | POA: Diagnosis not present

## 2019-12-06 NOTE — Telephone Encounter (Signed)
I spoke with John [name] daughter John [name] and confirmed his ct scan appointment for this Friday 12/11.

## 2019-12-06 NOTE — Telephone Encounter (Signed)
I left vm for John [name], the patient's daughter informing her oh CT scan appointment scheduled for 12/11 at 1430 arrive at 1415.

## 2019-12-06 NOTE — Progress Notes (Signed)
[facility] Telephone:([phone]) [phone]   Fax:([phone]) [phone]  OFFICE PROGRESS NOTE  John Esters, NP [address] [place] [address]  DIAGNOSIS: Recurrent non-small cell lung cancer initially diagnosed as stage IA (T1b, N0, M0) non-small cell lung cancer, squamous cell carcinoma diagnosed in June 2016.  The patient has disease recurrence in May 2020.  PRIOR THERAPY:  1) Course of curative radiotherapy to the right lower lobe lung nodule as well as mediastinal lymphadenopathy under the care of Dr. Tammi Klippel. 2) SBRT to the large solid mass in the right lower lobe under the care of Dr. Tammi Klippel.  Completed 09/03/2019.  CURRENT THERAPY: Observation.  INTERVAL HISTORY: John [name] 73 y.o. male returns to the clinic today for follow-up visit.  The patient is feeling fine today with no concerning complaints except for mild fatigue.  He tolerated his previous radiotherapy to the right lower lobe lung mass fairly well.  He denied having any current chest pain, shortness of breath, cough or hemoptysis.  He denied having any fever or chills.  He has no nausea, vomiting, diarrhea or constipation.  He denied having any headache or visual changes.  He was supposed to have repeat CT scan of the chest before this visit but unfortunately no one called the patient to schedule his scan.  MEDICAL HISTORY: Past Medical History:  Diagnosis Date  . B12 deficiency   . CHF (congestive heart failure) ([facility])   . COPD (chronic obstructive pulmonary disease) ([facility])   . Diabetes mellitus without complication ([facility])   . Folate deficiency   . Gout   . Hyperlipemia   . Hypertension   . 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 MI (myocardial infarction) ([facility])   . Pulmonary hypertension ([facility])     ALLERGIES:  is allergic to levaquin [levofloxacin in d5w] and lisinopril.  MEDICATIONS:  Current Outpatient Medications  Medication Sig Dispense Refill  .  acetaminophen (TYLENOL) 325 MG tablet Take 2 tablets (650 mg total) by mouth every 6 (six) hours as needed for mild pain (or Fever >/= 101).    Marland Kitchen albuterol (PROAIR HFA) 108 (90 BASE) MCG/ACT inhaler INHALE 2 PUFFS BY MOUTH EVERY 4 HOURS AS NEEDED FOR WHEEZING (Patient taking differently: Inhale 2 puffs into the lungs every 4 (four) hours as needed for wheezing. ) 1 Inhaler 11  . allopurinol (ZYLOPRIM) 300 MG tablet Take 300 mg by mouth daily.    Marland Kitchen amLODipine (NORVASC) 2.5 MG tablet Take 1 tablet (2.5 mg total) by mouth daily. 30 tablet 3  . atorvastatin (LIPITOR) 20 MG tablet Take 1 tablet (20 mg total) by mouth daily. 30 tablet 0  . budesonide-formoterol (SYMBICORT) 160-4.5 MCG/ACT inhaler Inhale 2 puffs into the lungs 2 (two) times daily.    . Cholecalciferol (VITAMIN D3) 50 MCG (2000 UT) capsule Take 2,000 Units by mouth daily.     . ferrous sulfate 325 (65 FE) MG tablet Take 1 tablet (325 mg total) by mouth every Monday, Wednesday, and Friday. 30 tablet 2  . folic acid (FOLVITE) 1 MG tablet Take 1 tablet (1 mg total) by mouth daily. 30 tablet 11  . Magnesium Oxide 200 MG TABS Take 1 tablet (200 mg total) by mouth 2 (two) times daily. 60 tablet 0  . megestrol (MEGACE) 400 MG/10ML suspension Take 10 mLs (400 mg total) by mouth daily. 240 mL 1  . Multiple Vitamin (MULTIVITAMIN WITH MINERALS) TABS tablet Take  1 tablet by mouth daily. Over the counter 30 tablet 3  . pantoprazole (PROTONIX) 40 MG tablet Take 1 tablet (40 mg total) by mouth 2 (two) times daily before a meal. 180 tablet 3  . potassium chloride SA (K-DUR) 20 MEQ tablet Take 1 tablet (20 mEq total) by mouth 2 (two) times daily. 14 tablet 0  . QUEtiapine (SEROQUEL) 25 MG tablet Take 1 tablet (25 mg total) by mouth 2 (two) times daily. 60 tablet 0  . senna-docusate (SENOKOT-S) 8.6-50 MG tablet Take 1 tablet by mouth at bedtime. 30 tablet 0  . sucralfate (CARAFATE) 1 g tablet Take 1 g by mouth 4 (four) times daily.     . traMADol (ULTRAM) 50  MG tablet Take 1 tablet (50 mg total) by mouth every 6 (six) hours as needed for moderate pain. 15 tablet 0  . vitamin B-12 1000 MCG tablet Take 1 tablet (1,000 mcg total) by mouth daily. 30 tablet 3  . Vitamin D, Ergocalciferol, (DRISDOL) 1.25 MG (50000 UT) CAPS capsule Take 50,000 Units by mouth every Friday.      No current facility-administered medications for this visit.     SURGICAL HISTORY:  Past Surgical History:  Procedure Laterality Date  . APPENDECTOMY    . BIOPSY  04/21/2019   Procedure: BIOPSY;  Surgeon: Gatha Mayer, MD;  Location: [facility];  Service: Endoscopy;;  . ESOPHAGOGASTRODUODENOSCOPY (EGD) WITH PROPOFOL N/A 04/21/2019   Procedure: ESOPHAGOGASTRODUODENOSCOPY (EGD) WITH PROPOFOL;  Surgeon: Gatha Mayer, MD;  Location: [facility];  Service: Endoscopy;  Laterality: N/A;  . ESOPHAGOGASTRODUODENOSCOPY (EGD) WITH PROPOFOL N/A 04/29/2019   Procedure: ESOPHAGOGASTRODUODENOSCOPY (EGD) WITH PROPOFOL;  Surgeon: Mauri Pole, MD;  Location: [facility];  Service: Endoscopy;  Laterality: N/A;  . HEMOSTASIS CLIP PLACEMENT  04/29/2019   Procedure: HEMOSTASIS CLIP PLACEMENT;  Surgeon: Mauri Pole, MD;  Location: [facility];  Service: Endoscopy;;  Clip placed as marker not for bleeding control  . IR ANGIOGRAM SELECTIVE EACH ADDITIONAL VESSEL  04/29/2019  . IR ANGIOGRAM SELECTIVE EACH ADDITIONAL VESSEL  04/29/2019  . IR ANGIOGRAM SELECTIVE EACH ADDITIONAL VESSEL  04/29/2019  . IR ANGIOGRAM VISCERAL SELECTIVE  04/29/2019  . IR EMBO ART  VEN HEMORR LYMPH EXTRAV  INC GUIDE ROADMAPPING  04/29/2019  . IR US GUIDE VASC ACCESS RIGHT  04/29/2019  . LUNG BIOPSY      REVIEW OF SYSTEMS:  A comprehensive review of systems was negative except for: Constitutional: positive for fatigue   PHYSICAL EXAMINATION: General appearance: alert, cooperative, fatigued and no distress Head: Normocephalic, without obvious abnormality, atraumatic Neck: no adenopathy, no JVD, supple,  symmetrical, trachea midline and thyroid not enlarged, symmetric, no tenderness/mass/nodules Lymph nodes: Cervical, supraclavicular, and axillary nodes normal. Resp: clear to auscultation bilaterally Back: symmetric, no curvature. ROM normal. No CVA tenderness. Cardio: regular rate and rhythm, S1, S2 normal, no murmur, click, rub or gallop GI: soft, non-tender; bowel sounds normal; no masses,  no organomegaly Extremities: extremities normal, atraumatic, no cyanosis or edema  ECOG PERFORMANCE STATUS: 1 - Symptomatic but completely ambulatory  Blood pressure 112/60, pulse 75, temperature 98.7 F (37.1 C), temperature source Oral, resp. rate 18, height 5\' 9"  (1.753 m), weight 167 lb 4.8 oz (75.9 kg), SpO2 97 %.  LABORATORY DATA: Lab Results  Component Value Date   WBC 7.8 12/03/2019   HGB 9.3 (L) 12/03/2019   HCT 29.6 (L) 12/03/2019   MCV 101.7 (H) 12/03/2019   PLT 217 12/03/2019      Chemistry  Component Value Date/Time   NA 146 (H) 12/03/2019 0848   NA 143 10/04/2016 1354   K 3.4 (L) 12/03/2019 0848   K 3.9 10/04/2016 1354   CL 104 12/03/2019 0848   CO2 33 (H) 12/03/2019 0848   CO2 25 10/04/2016 1354   BUN 12 12/03/2019 0848   BUN 11.6 10/04/2016 1354   CREATININE 1.30 (H) 12/03/2019 0848   CREATININE 1.3 10/04/2016 1354      Component Value Date/Time   CALCIUM 8.9 12/03/2019 0848   CALCIUM 9.5 10/04/2016 1354   ALKPHOS 103 12/03/2019 0848   ALKPHOS 119 10/04/2016 1354   AST 9 (L) 12/03/2019 0848   AST 16 10/04/2016 1354   ALT <6 12/03/2019 0848   ALT 14 10/04/2016 1354   BILITOT 0.3 12/03/2019 0848   BILITOT 0.53 10/04/2016 1354       RADIOGRAPHIC STUDIES: No results found.  ASSESSMENT AND PLAN: This is a very pleasant 73 years old African-American male with stage IA non-small cell lung cancer, squamous cell carcinoma diagnosed in June 2016 status post curative radiotherapy with partial response.  The patient has been on observation since that time. He  was lost to follow-up since 2017. The patient presented with new findings on recent CT scan of the chest including annual right lower lobe lung mass as well as increasing size of the previous lesion in the right lower lobe. I ordered a PET scan which was performed recently and showed hypermetabolic activity in the large solid mass in the right lower lobe. The patient was seen by Dr. Tammi Klippel and he underwent SBRT to the hypermetabolic right lower lobe mass completed on 09/03/2019.  He tolerated this treatment well. The patient is currently on observation and feeling fine. He was supposed to have repeat CT scan of the chest before this visit but unfortunately this was not performed. I will arrange for the patient to have his a scan done in the next few days and he will come back for follow-up visit in 1 week for reevaluation and discussion of his discuss results. He was advised to call immediately if he has any concerning symptoms in the interval. The patient voices understanding of current disease status and treatment options and is in agreement with the current care plan.  All questions were answered. The patient knows to call the clinic with any problems, questions or concerns. We can certainly see the patient much sooner if necessary.  Disclaimer: This note was dictated with voice recognition software. Similar sounding words can inadvertently be transcribed and may not be corrected upon review.

## 2019-12-08 ENCOUNTER — Telehealth: Payer: Self-pay | Admitting: Internal Medicine

## 2019-12-08 NOTE — Telephone Encounter (Signed)
Scheduled appt 12/7 los - pt is aware of appt date and time

## 2019-12-09 ENCOUNTER — Telehealth: Payer: Self-pay | Admitting: *Deleted

## 2019-12-09 NOTE — Telephone Encounter (Signed)
Received call from daughter asking about appts.  Informed of Monday's appts.  She would like [place] to call transportation for him when Dr Julien Nordmann is finished.  Not placed in appts.

## 2019-12-10 ENCOUNTER — Other Ambulatory Visit: Payer: Self-pay

## 2019-12-10 ENCOUNTER — Ambulatory Visit (HOSPITAL_COMMUNITY)
Admission: RE | Admit: 2019-12-10 | Discharge: 2019-12-10 | Disposition: A | Discharge status: Home / Self Care | Payer: Medicare Other | Source: Ambulatory Visit | Attending: Physician Assistant | Admitting: Physician Assistant

## 2019-12-10 DIAGNOSIS — C3431 Malignant neoplasm of lower lobe, right bronchus or lung: Secondary | ICD-10-CM

## 2019-12-13 ENCOUNTER — Encounter: Payer: Self-pay | Admitting: Physician Assistant

## 2019-12-13 ENCOUNTER — Inpatient Hospital Stay (HOSPITAL_BASED_OUTPATIENT_CLINIC_OR_DEPARTMENT_OTHER): Payer: Medicare Other | Admitting: Physician Assistant

## 2019-12-13 ENCOUNTER — Other Ambulatory Visit: Payer: Self-pay

## 2019-12-13 ENCOUNTER — Ambulatory Visit: Payer: Medicare Other | Admitting: Neurology

## 2019-12-13 VITALS — BP 95/50 | HR 88 | Temp 97.7°F | Resp 17 | Ht 69.0 in | Wt 164.4 lb

## 2019-12-13 DIAGNOSIS — C3431 Malignant neoplasm of lower lobe, right bronchus or lung: Secondary | ICD-10-CM | POA: Diagnosis not present

## 2019-12-13 NOTE — Progress Notes (Signed)
John [name] OFFICE PROGRESS NOTE  John Esters, NP [place] [address]  DIAGNOSIS: Recurrent non-small cell lung cancer initially diagnosed as stage IA (T1b, N0, M0) non-small cell lung cancer, squamous cell carcinoma diagnosed in June 2016.  The patient has disease recurrence in May 2020.  PRIOR THERAPY:  1) Course of curative radiotherapy to the right lower lobe lung nodule as well as mediastinal lymphadenopathy under the care of Dr. Tammi Klippel. 2) SBRT to the large solid mass in the right lower lobe under the care of Dr. Tammi Klippel.  Completed 09/03/2019.  CURRENT THERAPY: Observation  INTERVAL HISTORY: John [name] 73 y.o. male returns to the clinic for a follow-up visit.  The patient is feeling well today without any concerning complaints.  He denies any fever, chills, night sweats, or weight loss.  He denies any chest pain, shortness of breath, cough, or hemoptysis.  He denies any nausea, vomiting, diarrhea, or constipation.  He denies any headache or visual changes.  The patient's blood pressure is a little on the low side today. He took his anti-hypertensive medications today. The patient is asymptomatic and denies any syncope, lightheadedness, or dizziness.  The patient recently had a restaging CT scan performed.  He is here today for evaluation and to review his scan results.  MEDICAL HISTORY: Past Medical History:  Diagnosis Date  . B12 deficiency   . CHF (congestive heart failure) ([facility])   . COPD (chronic obstructive pulmonary disease) ([facility])   . Diabetes mellitus without complication ([facility])   . Folate deficiency   . Gout   . Hyperlipemia   . Hypertension   . Lung cancer ([facility])    squamous cell carcinoma of right lower lobe lung  . MI (myocardial infarction) ([facility])   . Pulmonary hypertension ([facility])     ALLERGIES:  is allergic to levaquin [levofloxacin in d5w] and lisinopril.  MEDICATIONS:  Current Outpatient Medications  Medication Sig  Dispense Refill  . acetaminophen (TYLENOL) 325 MG tablet Take 2 tablets (650 mg total) by mouth every 6 (six) hours as needed for mild pain (or Fever >/= 101).    Marland Kitchen albuterol (PROAIR HFA) 108 (90 BASE) MCG/ACT inhaler INHALE 2 PUFFS BY MOUTH EVERY 4 HOURS AS NEEDED FOR WHEEZING (Patient taking differently: Inhale 2 puffs into the lungs every 4 (four) hours as needed for wheezing. ) 1 Inhaler 11  . allopurinol (ZYLOPRIM) 300 MG tablet Take 300 mg by mouth daily.    Marland Kitchen amLODipine (NORVASC) 2.5 MG tablet Take 1 tablet (2.5 mg total) by mouth daily. 30 tablet 3  . atorvastatin (LIPITOR) 20 MG tablet Take 1 tablet (20 mg total) by mouth daily. 30 tablet 0  . budesonide-formoterol (SYMBICORT) 160-4.5 MCG/ACT inhaler Inhale 2 puffs into the lungs 2 (two) times daily.    . Cholecalciferol (VITAMIN D3) 50 MCG (2000 UT) capsule Take 2,000 Units by mouth daily.     . ferrous sulfate 325 (65 FE) MG tablet Take 1 tablet (325 mg total) by mouth every Monday, Wednesday, and Friday. 30 tablet 2  . folic acid (FOLVITE) 1 MG tablet Take 1 tablet (1 mg total) by mouth daily. 30 tablet 11  . Magnesium Oxide 200 MG TABS Take 1 tablet (200 mg total) by mouth 2 (two) times daily. 60 tablet 0  . megestrol (MEGACE) 400 MG/10ML suspension Take 10 mLs (400 mg total) by mouth daily. 240 mL 1  . Multiple Vitamin (MULTIVITAMIN WITH MINERALS) TABS tablet Take 1 tablet by  mouth daily. Over the counter 30 tablet 3  . pantoprazole (PROTONIX) 40 MG tablet Take 1 tablet (40 mg total) by mouth 2 (two) times daily before a meal. 180 tablet 3  . potassium chloride SA (K-DUR) 20 MEQ tablet Take 1 tablet (20 mEq total) by mouth 2 (two) times daily. 14 tablet 0  . QUEtiapine (SEROQUEL) 25 MG tablet Take 1 tablet (25 mg total) by mouth 2 (two) times daily. 60 tablet 0  . senna-docusate (SENOKOT-S) 8.6-50 MG tablet Take 1 tablet by mouth at bedtime. 30 tablet 0  . sucralfate (CARAFATE) 1 g tablet Take 1 g by mouth 4 (four) times daily.     .  traMADol (ULTRAM) 50 MG tablet Take 1 tablet (50 mg total) by mouth every 6 (six) hours as needed for moderate pain. 15 tablet 0  . vitamin B-12 1000 MCG tablet Take 1 tablet (1,000 mcg total) by mouth daily. 30 tablet 3  . Vitamin D, Ergocalciferol, (DRISDOL) 1.25 MG (50000 UT) CAPS capsule Take 50,000 Units by mouth every Friday.      No current facility-administered medications for this visit.    SURGICAL HISTORY:  Past Surgical History:  Procedure Laterality Date  . APPENDECTOMY    . BIOPSY  04/21/2019   Procedure: BIOPSY;  Surgeon: Gatha Mayer, MD;  Location: [facility];  Service: Endoscopy;;  . ESOPHAGOGASTRODUODENOSCOPY (EGD) WITH PROPOFOL N/A 04/21/2019   Procedure: ESOPHAGOGASTRODUODENOSCOPY (EGD) WITH PROPOFOL;  Surgeon: Gatha Mayer, MD;  Location: [facility];  Service: Endoscopy;  Laterality: N/A;  . ESOPHAGOGASTRODUODENOSCOPY (EGD) WITH PROPOFOL N/A 04/29/2019   Procedure: ESOPHAGOGASTRODUODENOSCOPY (EGD) WITH PROPOFOL;  Surgeon: Mauri Pole, MD;  Location: [facility];  Service: Endoscopy;  Laterality: N/A;  . HEMOSTASIS CLIP PLACEMENT  04/29/2019   Procedure: HEMOSTASIS CLIP PLACEMENT;  Surgeon: Mauri Pole, MD;  Location: [facility];  Service: Endoscopy;;  Clip placed as marker not for bleeding control  . IR ANGIOGRAM SELECTIVE EACH ADDITIONAL VESSEL  04/29/2019  . IR ANGIOGRAM SELECTIVE EACH ADDITIONAL VESSEL  04/29/2019  . IR ANGIOGRAM SELECTIVE EACH ADDITIONAL VESSEL  04/29/2019  . IR ANGIOGRAM VISCERAL SELECTIVE  04/29/2019  . IR EMBO ART  VEN HEMORR LYMPH EXTRAV  INC GUIDE ROADMAPPING  04/29/2019  . IR US GUIDE VASC ACCESS RIGHT  04/29/2019  . LUNG BIOPSY      REVIEW OF SYSTEMS:   Review of Systems  Constitutional: Negative for appetite change, chills, fatigue, fever and unexpected weight change.  HENT: Negative for mouth sores, nosebleeds, sore throat and trouble swallowing.   Eyes: Negative for eye problems and icterus.  Respiratory:  Negative for cough, hemoptysis, shortness of breath and wheezing. Cardiovascular: Negative for chest pain and leg swelling.  Gastrointestinal: Negative for abdominal pain, constipation, diarrhea, nausea and vomiting.  Genitourinary: Negative for bladder incontinence, difficulty urinating, dysuria, frequency and hematuria.   Musculoskeletal: Negative for back pain, gait problem, neck pain and neck stiffness.  Skin: Negative for itching and rash.  Neurological: Negative for dizziness, extremity weakness, gait problem, headaches, light-headedness and seizures.  Hematological: Negative for adenopathy. Does not bruise/bleed easily.  Psychiatric/Behavioral: Negative for confusion, depression and sleep disturbance. The patient is not nervous/anxious.     PHYSICAL EXAMINATION:  Blood pressure (!) 95/50, pulse 88, temperature 97.7 F (36.5 C), temperature source Temporal, resp. rate 17, height 5\' 9"  (1.753 m), weight 164 lb 6.4 oz (74.6 kg), SpO2 98 %.  ECOG PERFORMANCE STATUS: 0 - Asymptomatic  Physical Exam  Constitutional: Oriented to person, place,  and time and well-developed, well-nourished, and in no distress.   HENT:  Head: Normocephalic and atraumatic.  Mouth/Throat: Oropharynx is clear and moist. No oropharyngeal exudate.  Eyes: Conjunctivae are normal. Right eye exhibits no discharge. Left eye exhibits no discharge. No scleral icterus.  Neck: Normal range of motion. Neck supple.  Cardiovascular: Normal rate, regular rhythm, normal heart sounds and intact distal pulses.   Pulmonary/Chest: Effort normal. Quiet breath sounds in all lung fields. No respiratory distress. No wheezes. No rales.  Abdominal: Soft. Bowel sounds are normal. Exhibits no distension and no mass. There is no tenderness.  Musculoskeletal: Normal range of motion. Exhibits no edema.  Lymphadenopathy:    No cervical adenopathy.  Neurological: Alert and oriented to person, place, and time. Exhibits normal muscle tone.  Gait normal. Coordination normal.  Skin: Skin is warm and dry. No rash noted. Not diaphoretic. No erythema. No pallor.  Psychiatric: Mood, memory and judgment normal.  Vitals reviewed.  LABORATORY DATA: Lab Results  Component Value Date   WBC 7.8 12/03/2019   HGB 9.3 (L) 12/03/2019   HCT 29.6 (L) 12/03/2019   MCV 101.7 (H) 12/03/2019   PLT 217 12/03/2019      Chemistry      Component Value Date/Time   NA 146 (H) 12/03/2019 0848   NA 143 10/04/2016 1354   K 3.4 (L) 12/03/2019 0848   K 3.9 10/04/2016 1354   CL 104 12/03/2019 0848   CO2 33 (H) 12/03/2019 0848   CO2 25 10/04/2016 1354   BUN 12 12/03/2019 0848   BUN 11.6 10/04/2016 1354   CREATININE 1.30 (H) 12/03/2019 0848   CREATININE 1.3 10/04/2016 1354      Component Value Date/Time   CALCIUM 8.9 12/03/2019 0848   CALCIUM 9.5 10/04/2016 1354   ALKPHOS 103 12/03/2019 0848   ALKPHOS 119 10/04/2016 1354   AST 9 (L) 12/03/2019 0848   AST 16 10/04/2016 1354   ALT <6 12/03/2019 0848   ALT 14 10/04/2016 1354   BILITOT 0.3 12/03/2019 0848   BILITOT 0.53 10/04/2016 1354       RADIOGRAPHIC STUDIES:  CT Chest Wo Contrast  Result Date: 12/11/2019 CLINICAL DATA:  Restaging right lung cancer EXAM: CT CHEST WITHOUT CONTRAST TECHNIQUE: Multidetector CT imaging of the chest was performed following the standard protocol without IV contrast. COMPARISON:  PET-CT, 05/26/2019 FINDINGS: Cardiovascular: Aortic atherosclerosis. 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 Normal heart size. Extensive 3 vessel coronary artery calcifications. No pericardial effusion. Mediastinum/Nodes: No enlarged mediastinal, hilar, or axillary lymph nodes. Thyroid gland, trachea, and esophagus demonstrate no significant findings. Lungs/Pleura: Mild centrilobular emphysema. Small right pleural effusion. Interval decrease in size of a lobulated mass of the right lower lobe, dominant component measuring 4.0 x 3.0 cm, previously 5.3 x  4.4 cm (series 5, image 106). Additional stable small pulmonary nodules of the right upper lobe, likely incidental and benign, possibly partially calcified (series 5, image 37, 81). Diffuse bilateral bronchial wall thickening. Upper Abdomen: No acute abnormality. Gallstones. Coil material in the vicinity of the gastroduodenal artery. Nonobstructive right nephrolithiasis. Musculoskeletal: No chest wall mass or suspicious bone lesions identified. IMPRESSION: 1. Interval decrease in size of a lobulated mass of the right lower lobe, dominant component measuring 4.0 x 3.0 cm, previously 5.3 x 4.4 cm. Findings are consistent with treatment response. 2. Additional stable small pulmonary nodules of the right upper lobe and left upper lobe, likely incidental and benign, possibly partially calcified (series 5, image 37).  3. Small right pleural effusion. 4. Coronary artery disease 5. Aortic Atherosclerosis (ICD10-I70.0). 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 6.  Emphysema (ICD10-J43.9). 7. Nonobstructive right nephrolithiasis. 8. Cholelithiasis. Electronically Signed   By: Eddie Candle M.D.   On: 12/11/2019 21:13     ASSESSMENT/PLAN:  This is a very pleasant 73 year old African-American male who was initially diagnosed in June 2016 with stage Ia non-small cell lung cancer, squamous cell carcinoma. He is status post curative radiotherapy with a partial response. He has been on observation but he waslost to follow-up until May of 2020 when a repeat CT scan showed an enlarging right lower lobe lesion which was hypermetabolic on a PET scan.  He then completed SBRT to the recurrent right lower lobe lesion under the care of Dr. Tammi Klippel which was completed on 09/03/2019.   The patient recently had a restaging CT scan performed.  Dr. Julien Nordmann personally and independently reviewed the scan and discussed results with the patient today. The scan did not show any evidence of disease progression. It  showed an interval decrease in the right lower lobe mass.   Dr. Julien Nordmann recommends that the patient continue on observation with a repeat CT scan of the chest in 6 months.  We will see the patient back at that time for evaluation and to review his scan results.  Regarding the patient's blood pressure today, he is asymptomatic.  The patient was advised to monitor his blood pressure at home and keep a log of his blood pressure readings.  If his blood pressure readings continue to be low, I encouraged the patient to schedule follow-up appointment with his primary care provider to adjust his antihypertensive medications.  I encouraged the patient to increase his fluid intake as well.   The patient was advised to call immediately if he has any concerning symptoms in the interval. The patient voices understanding of current disease status and treatment options and is in agreement with the current care plan. All questions were answered. The patient knows to call the clinic with any problems, questions or concerns. We can certainly see the patient much sooner if necessary   Orders Placed This Encounter  Procedures  . CT Chest W Contrast    Standing Status:   Future    Standing Expiration Date:   12/12/2020    Order Specific Question:   ** REASON FOR EXAM (FREE TEXT)    Answer:   Restaging Lung Cancer    Order Specific Question:   If indicated for the ordered procedure, I authorize the administration of contrast media per Radiology protocol    Answer:   Yes    Order Specific Question:   Preferred imaging location?    Answer:   [facility]    Order Specific Question:   Radiology Contrast Protocol - do NOT remove file path    Answer:   [email or web address]  . CBC with Differential ([facility] Only)    Standing Status:   Future    Standing Expiration Date:   12/12/2020  . CMP ([facility] only)    Standing Status:   Future    Standing Expiration Date:    12/12/2020     Tobe Sos Babyboy Loya, PA-C 12/13/19  ADDENDUM: Hematology/Oncology Attending: I had a face-to-face encounter with the patient today.  I recommended his care plan.  This is a very pleasant 73 years old African-American male with a stage Ia non-small cell lung cancer, squamous cell carcinoma status post  SBRT with partial response and has been on observation since that time. The patient had a recent CT scan of the chest performed. I personally and independently reviewed the scans and discussed the results with the patient today. His scan showed no concerning findings for disease recurrence or metastasis. I recommended for the patient to continue on observation with repeat CT scan of the chest in 6 months. The patient was advised to call immediately if he has any concerning symptoms in the interval.  Disclaimer: This note was dictated with voice recognition software. Similar sounding words can inadvertently be transcribed and may be missed upon review. Eilleen Kempf, MD 12/13/19

## 2019-12-14 ENCOUNTER — Telehealth: Payer: Self-pay | Admitting: Physician Assistant

## 2019-12-14 NOTE — Telephone Encounter (Signed)
Scheduled per los. Called and left msg. Mailed printout  °

## 2020-02-14 ENCOUNTER — Other Ambulatory Visit: Payer: Self-pay

## 2020-02-14 ENCOUNTER — Other Ambulatory Visit: Payer: Medicare Other | Admitting: Hospice

## 2020-02-14 DIAGNOSIS — C3431 Malignant neoplasm of lower lobe, right bronchus or lung: Secondary | ICD-10-CM

## 2020-02-14 DIAGNOSIS — Z515 Encounter for palliative care: Secondary | ICD-10-CM

## 2020-02-14 NOTE — Progress Notes (Signed)
[facility] Pall[facility] Consult Note Telephone: [phone]  Fax: [phone]  PATIENT NAME: John [name] DOB: [birth year] MRN: [id number]  PRIMARY CARE PROVIDER:   [name], NP  REFERRING PROVIDER:  [name], NP [address] [place],  [place] [address]  [place] PARTY:Self Emergency contact, John [name] - daughter- [phone]    RECOMMENDATIONS/PLAN: 1. Advance Care Planning/Goals of Care: Visit consisted of building trust and follow up on palliative care. Patient is a DNR.  Goals include to maximize quality of life and symptom management. 2. Symptom management:No respiratory distress/cough  related to RLL lung CA. Patient denied pain/discomfort. He said " I am doing well, thank God". John [name] affirmed patient doing well, no acute concerns. Appetite is good, no nausea/vomiting. He takes his medications as ordered. Encouraged ongoing care. 3. Follow up Palliative Care Visit: Palliative care will continue to follow for goals of care clarification and symptom management.   I spent14minutes providing this consultation. More than 50% of the time in this consultation was spent on coordinating advance care communication.  HISTORY OF PRESENT ILLNESS:John E Mooreis a 74 y.o.year oldmalewith multiple medical problems including Right lower lober lung CA, Diastolic CHF, HTN, CKD 3, DMPalliative Care was asked to help address goals of care   CODE STATUS: DNR  PPS: 50% HOSPICE ELIGIBILITY/DIAGNOSIS: TBD  PAST MEDICAL HISTORY:  Past Medical History:  Diagnosis Date  . B12 deficiency   . CHF (congestive heart failure) ([facility])   . COPD (chronic obstructive pulmonary disease) ([facility])   . Diabetes mellitus without complication ([facility])   . Folate deficiency   . Gout   . Hyperlipemia   . Hypertension   . 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 MI (myocardial infarction) ([facility])   .  Pulmonary hypertension ([facility])     SOCIAL HX:  Social History   Tobacco Use  . Smoking status: Former Smoker    Packs/day: 1.50    Years: 60.00    Pack years: 90.00    Types: Cigarettes    Quit date: 09/29/2012    Years since quitting: 7.3  . Smokeless tobacco: Never Used  Substance Use Topics  . Alcohol use: No    Alcohol/week: 0.0 standard drinks    ALLERGIES:  Allergies  Allergen Reactions  . Levaquin [Levofloxacin In D5w] Other (See Comments)    Unknown reaction  . Lisinopril Swelling    Facial swelling     PERTINENT MEDICATIONS:  Outpatient Encounter Medications as of 76/15/2021  Medication Sig  . acetaminophen (TYLENOL) 325 MG tablet Take 2 tablets (650 mg total) by mouth every 6 (six) hours as needed for mild pain (or Fever >/= 101).  Marland Kitchen albuterol (PROAIR HFA) 108 (90 BASE) MCG/ACT inhaler INHALE 2 PUFFS BY MOUTH EVERY 4 HOURS AS NEEDED FOR WHEEZING (Patient taking differently: Inhale 2 puffs into the lungs every 4 (four) hours as needed for wheezing. )  . allopurinol (ZYLOPRIM) 300 MG tablet Take 300 mg by mouth daily.  Marland Kitchen amLODipine (NORVASC) 2.5 MG tablet Take 1 tablet (2.5 mg total) by mouth daily.  Marland Kitchen atorvastatin (LIPITOR) 20 MG tablet Take 1 tablet (20 mg total) by mouth daily.  . budesonide-formoterol (SYMBICORT) 160-4.5 MCG/ACT inhaler Inhale 2 puffs into the lungs 2 (two) times daily.  . Cholecalciferol (VITAMIN D3) 50 MCG (2000 UT) capsule Take 2,000 Units by mouth daily.   . ferrous sulfate 325 (65 FE) MG tablet Take 1 tablet (325  mg total) by mouth every Monday, Wednesday, and Friday.  . folic acid (FOLVITE) 1 MG tablet Take 1 tablet (1 mg total) by mouth daily.  . Magnesium Oxide 200 MG TABS Take 1 tablet (200 mg total) by mouth 2 (two) times daily.  . megestrol (MEGACE) 400 MG/10ML suspension Take 10 mLs (400 mg total) by mouth daily.  . Multiple Vitamin (MULTIVITAMIN WITH MINERALS) TABS tablet Take 1 tablet by mouth daily. Over the counter  . pantoprazole  (PROTONIX) 40 MG tablet Take 1 tablet (40 mg total) by mouth 2 (two) times daily before a meal.  . potassium chloride SA (K-DUR) 20 MEQ tablet Take 1 tablet (20 mEq total) by mouth 2 (two) times daily.  . QUEtiapine (SEROQUEL) 25 MG tablet Take 1 tablet (25 mg total) by mouth 2 (two) times daily.  Marland Kitchen senna-docusate (SENOKOT-S) 8.6-50 MG tablet Take 1 tablet by mouth at bedtime.  . sucralfate (CARAFATE) 1 g tablet Take 1 g by mouth 4 (four) times daily.   . traMADol (ULTRAM) 50 MG tablet Take 1 tablet (50 mg total) by mouth every 6 (six) hours as needed for moderate pain.  . vitamin B-12 1000 MCG tablet Take 1 tablet (1,000 mcg total) by mouth daily.  . Vitamin D, Ergocalciferol, (DRISDOL) 1.25 MG (50000 UT) CAPS capsule Take 50,000 Units by mouth every Friday.    No facility-administered encounter medications on file as of 76/15/2021.     Teodoro Spray, NP

## 2020-02-25 ENCOUNTER — Ambulatory Visit (INDEPENDENT_AMBULATORY_CARE_PROVIDER_SITE_OTHER): Payer: Medicare Other | Admitting: Neurology

## 2020-02-25 ENCOUNTER — Other Ambulatory Visit: Payer: Self-pay

## 2020-02-25 ENCOUNTER — Other Ambulatory Visit: Payer: Medicare Other

## 2020-02-25 ENCOUNTER — Encounter: Payer: Self-pay | Admitting: Neurology

## 2020-02-25 VITALS — BP 124/77 | HR 69 | Ht 69.0 in | Wt 164.4 lb

## 2020-02-25 DIAGNOSIS — F03A0 Unspecified dementia, mild, without behavioral disturbance, psychotic disturbance, mood disturbance, and anxiety: Secondary | ICD-10-CM

## 2020-02-25 DIAGNOSIS — F0390 Unspecified dementia without behavioral disturbance: Secondary | ICD-10-CM | POA: Diagnosis not present

## 2020-02-25 MED ORDER — DONEPEZIL HCL 10 MG PO TABS: ORAL_TABLET | ORAL | 11 refills | Status: AC

## 2020-02-25 NOTE — Progress Notes (Signed)
NEUROLOGY CONSULTATION NOTE  John [name] MRN: [id number] DOB: 05/04/1946  Referring provider: [name], NP Primary care provider: [name], NP  Reason for consult:  Memory loss   Thank you for your kind referral of John [name] for consultation of the above symptoms. Although his history is well known to you, please allow me to reiterate it for the purpose of our medical record. The patient was accompanied to the clinic by his granddaughter John [name] who also provides collateral information. Records and images were personally reviewed where available.   HISTORY OF PRESENT ILLNESS: This is a pleasant 74 year old right-handed man with a history of hypertension, hyperlipidemia, CHF, COPD, pulmonary hypertension, NSCLC, presenting for evaluation of memory loss. He reports memory depends on what he is doing. His family started noticing memory changes around 2 years ago with mostly forgetfulness. He lives with his daughter who has been managing his medications and finances for the past 2-3 years. He would not remember when bills were due. Family sets up his medications and call him as a reminder. He stopped driving decades ago. He is independent with dressing and bathing. He was having more mood issues a year ago where he was more irritable, however this year has not been as bad. No paranoia or hallucinations. John [name] is unsure of his medications, he is listed to be taking Seroquel, which he says he is taking and making him a lot calmer.   He denies any headaches, dizziness, diplopia, dysarthria/dysphagia, neck/back pain, focal numbness/tingling/weakness, bowel/bladder dysfunction, anosmia, or tremors. Sleep is good. John [name] reports that he may lose consciousness after standing for prolonged periods or when it is really hot. Walking to the car is too much for him. He fell and lost consciousness in July 2020, he woke up in the bathroom where he had been shaving. No tongue  bite/incontinence. He had a head CT which I personally reviewed, no acute changes, there was diffuse atrophy and chronic microvascular disease. He fell out of bed in August 2020 and had another head CT with no acute changes. Seen. There is no family history of dementia. He denies any alcohol use. He is s/p curative radiotherapy with partial response, recurrent right lower lobe lesion s/p SBRT in 08/2019, with no evidence of disease progression, interval decrease in right lower lobe mass.   PAST MEDICAL HISTORY: Past Medical History:  Diagnosis Date  . B12 deficiency   . CHF (congestive heart failure) ([place])   . COPD (chronic obstructive pulmonary disease) ([place])   . Diabetes mellitus without complication ([place])   . Folate deficiency   . Gout   . Hyperlipemia   . Hypertension   . 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 MI (myocardial infarction) ([place])   . Pulmonary hypertension ([place])     PAST SURGICAL HISTORY: Past Surgical History:  Procedure Laterality Date  . APPENDECTOMY    . BIOPSY  04/21/2019   Procedure: BIOPSY;  Surgeon: Gatha Mayer, MD;  Location: [place];  Service: Endoscopy;;  . ESOPHAGOGASTRODUODENOSCOPY (EGD) WITH PROPOFOL N/A 04/21/2019   Procedure: ESOPHAGOGASTRODUODENOSCOPY (EGD) WITH PROPOFOL;  Surgeon: Gatha Mayer, MD;  Location: [place];  Service: Endoscopy;  Laterality: N/A;  . ESOPHAGOGASTRODUODENOSCOPY (EGD) WITH PROPOFOL N/A 04/29/2019   Procedure: ESOPHAGOGASTRODUODENOSCOPY (EGD) WITH PROPOFOL;  Surgeon: Mauri Pole, MD;  Location: [facility] ENDOSCOPY;  Service: Endoscopy;  Laterality: N/A;  . HEMOSTASIS CLIP PLACEMENT  04/29/2019   Procedure: HEMOSTASIS CLIP  PLACEMENT;  Surgeon: Mauri Pole, MD;  Location: [facility] ENDOSCOPY;  Service: Endoscopy;;  Clip placed as marker not for bleeding control  . IR ANGIOGRAM SELECTIVE EACH ADDITIONAL VESSEL  04/29/2019  . IR ANGIOGRAM SELECTIVE EACH ADDITIONAL VESSEL  04/29/2019  .  IR ANGIOGRAM SELECTIVE EACH ADDITIONAL VESSEL  04/29/2019  . IR ANGIOGRAM VISCERAL SELECTIVE  04/29/2019  . IR EMBO ART  VEN HEMORR LYMPH EXTRAV  INC GUIDE ROADMAPPING  04/29/2019  . IR US GUIDE VASC ACCESS RIGHT  04/29/2019  . LUNG BIOPSY      MEDICATIONS: Current Outpatient Medications on File Prior to Visit  Medication Sig Dispense Refill  . acetaminophen (TYLENOL) 325 MG tablet Take 2 tablets (650 mg total) by mouth every 6 (six) hours as needed for mild pain (or Fever >/= 101).    Marland Kitchen albuterol (PROAIR HFA) 108 (90 BASE) MCG/ACT inhaler INHALE 2 PUFFS BY MOUTH EVERY 4 HOURS AS NEEDED FOR WHEEZING (Patient taking differently: Inhale 2 puffs into the lungs every 4 (four) hours as needed for wheezing. ) 1 Inhaler 11  . allopurinol (ZYLOPRIM) 300 MG tablet Take 300 mg by mouth daily.    Marland Kitchen amLODipine (NORVASC) 2.5 MG tablet Take 1 tablet (2.5 mg total) by mouth daily. 30 tablet 3  . atorvastatin (LIPITOR) 20 MG tablet Take 1 tablet (20 mg total) by mouth daily. 30 tablet 0  . budesonide-formoterol (SYMBICORT) 160-4.5 MCG/ACT inhaler Inhale 2 puffs into the lungs 2 (two) times daily.    . Cholecalciferol (VITAMIN D3) 50 MCG (2000 UT) capsule Take 2,000 Units by mouth daily.     . ferrous sulfate 325 (65 FE) MG tablet Take 1 tablet (325 mg total) by mouth every Monday, Wednesday, and Friday. 30 tablet 2  . folic acid (FOLVITE) 1 MG tablet Take 1 tablet (1 mg total) by mouth daily. 30 tablet 11  . Magnesium Oxide 200 MG TABS Take 1 tablet (200 mg total) by mouth 2 (two) times daily. 60 tablet 0  . megestrol (MEGACE) 400 MG/10ML suspension Take 10 mLs (400 mg total) by mouth daily. 240 mL 1  . Multiple Vitamin (MULTIVITAMIN WITH MINERALS) TABS tablet Take 1 tablet by mouth daily. Over the counter 30 tablet 3  . pantoprazole (PROTONIX) 40 MG tablet Take 1 tablet (40 mg total) by mouth 2 (two) times daily before a meal. 180 tablet 3  . potassium chloride SA (K-DUR) 20 MEQ tablet Take 1 tablet (20 mEq  total) by mouth 2 (two) times daily. 14 tablet 0  . QUEtiapine (SEROQUEL) 25 MG tablet Take 1 tablet (25 mg total) by mouth 2 (two) times daily. 60 tablet 0  . senna-docusate (SENOKOT-S) 8.6-50 MG tablet Take 1 tablet by mouth at bedtime. 30 tablet 0  . sucralfate (CARAFATE) 1 g tablet Take 1 g by mouth 4 (four) times daily.     . traMADol (ULTRAM) 50 MG tablet Take 1 tablet (50 mg total) by mouth every 6 (six) hours as needed for moderate pain. 15 tablet 0  . vitamin B-12 1000 MCG tablet Take 1 tablet (1,000 mcg total) by mouth daily. 30 tablet 3  . Vitamin D, Ergocalciferol, (DRISDOL) 1.25 MG (50000 UT) CAPS capsule Take 50,000 Units by mouth every Friday.      No current facility-administered medications on file prior to visit.    ALLERGIES: Allergies  Allergen Reactions  . Levaquin [Levofloxacin In D5w] Other (See Comments)    Unknown reaction  . Lisinopril Swelling    Facial swelling  FAMILY HISTORY: Family History  Problem Relation Age of Onset  . Heart disease Mother   . Cancer Neg Hx     SOCIAL HISTORY: Social History   Socioeconomic History  . Marital status: Legally Separated    Spouse name: Not on file  . Number of children: 2  . Years of education: Not on file  . Highest education level: Not on file  Occupational History  . Occupation: retired  Tobacco Use  . Smoking status: Former Smoker    Packs/day: 1.50    Years: 60.00    Pack years: 90.00    Types: Cigarettes    Quit date: 09/29/2012    Years since quitting: 7.4  . Smokeless tobacco: Never Used  Substance and Sexual Activity  . Alcohol use: No    Alcohol/week: 0.0 standard drinks  . Drug use: No  . Sexual activity: Yes  Other Topics Concern  . Not on file  Social History Narrative   Separated - 1 daughter and 1 son   Retired Horticulturist, commercial   Former smoker, no EtOH   Right handed    Social Determinants of Health   Financial Resource Strain:   . Difficulty of Paying Living Expenses: Not on  file  Food Insecurity:   . Worried About Charity fundraiser in the Last Year: Not on file  . Ran Out of Food in the Last Year: Not on file  Transportation Needs:   . Lack of Transportation (Medical): Not on file  . Lack of Transportation (Non-Medical): Not on file  Physical Activity:   . Days of Exercise per Week: Not on file  . Minutes of Exercise per Session: Not on file  Stress:   . Feeling of Stress : Not on file  Social Connections:   . Frequency of Communication with Friends and Family: Not on file  . Frequency of Social Gatherings with Friends and Family: Not on file  . Attends Religious Services: Not on file  . Active Member of Clubs or Organizations: Not on file  . Attends Archivist Meetings: Not on file  . Marital Status: Not on file  Intimate Partner Violence:   . Fear of Current or Ex-Partner: Not on file  . Emotionally Abused: Not on file  . Physically Abused: Not on file  . Sexually Abused: Not on file    REVIEW OF SYSTEMS: Constitutional: No fevers, chills, or sweats, no generalized fatigue, change in appetite Eyes: No visual changes, double vision, eye pain Ear, nose and throat: No hearing loss, ear pain, nasal congestion, sore throat Cardiovascular: No chest pain, palpitations Respiratory:  No shortness of breath at rest or with exertion, wheezes GastrointestinaI: No nausea, vomiting, diarrhea, abdominal pain, fecal incontinence Genitourinary:  No dysuria, urinary retention or frequency Musculoskeletal:  No neck pain, back pain Integumentary: No rash, pruritus, skin lesions Neurological: as above Psychiatric: No depression, insomnia, anxiety Endocrine: No palpitations, fatigue, diaphoresis, mood swings, change in appetite, change in weight, increased thirst Hematologic/Lymphatic:  No anemia, purpura, petechiae. Allergic/Immunologic: no itchy/runny eyes, nasal congestion, recent allergic reactions, rashes  PHYSICAL EXAM: Vitals:   02/25/20 1049    BP: 124/77  Pulse: 69  SpO2: 93%   General: No acute distress Head:  Normocephalic/atraumatic Skin/Extremities: No rash, no edema Neurological Exam: Mental status: alert and oriented to person, place. Says it is February 2001, Tues. No dysarthria or aphasia, Fund of knowledge is reduced. Recent and remote memory impaired.  Attention and concentration are reduced.  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  Attention: Read list of digits (0/2) 1  Attention: Read list of letters (0/1) 1  Attention: Serial 7 subtraction starting at 100 (0/3) 1  Language: Repeat phrase (0/2) 0  Language : Fluency (0/1) 0  Abstraction (0/2) 1  Delayed Recall (0/5) 0  Orientation (0/6) 3  Total 7  Adjusted Score (based on education) 8    Cranial nerves: CN I: not tested CN II: pupils equal, round and reactive to light, visual fields intact CN III, IV, VI:  full range of motion, no nystagmus, no ptosis CN V: facial sensation intact CN VII: upper and lower face symmetric CN VIII: hearing intact to conversation Bulk & Tone: normal, no fasciculations. Motor: 5/5 throughout with no pronator drift. Sensation: intact to light touch, cold, pin, vibration and joint position sense.  No extinction to double simultaneous stimulation.  Romberg test negative Deep Tendon Reflexes: +1 throughout, no ankle clonus Cerebellar: no incoordination on finger to nose testing Gait: slow and cautious, no ataxia Tremor: none  IMPRESSION: This is a pleasant 74 year old right-handed man with a history of hypertension, hyperlipidemia, CHF, COPD, pulmonary hypertension, NSCLC, presenting for evaluation of memory loss. 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 Head CT showed diffuse atrophy and chronic microvascular disease. He needs assistance with complex tasks and meets criteria for dementia. MRI brain with and without contrast will be ordered to assess for underlying structural abnormality and assess  vascular load. Check TSH, B12. We discussed side effects and expectations from Donepezil, they are agreeable to starting Donepezil 5mg  daily for 2 weeks, then increase to 10mg  daily. He does not drive. Continue close supervision. Follow-up in 6 months, they know to call for any changes.   Thank you for allowing me to participate in the care of this patient. Please do not hesitate to call for any questions or concerns.   Ellouise Newer, M.D.  CC: [name], NP

## 2020-02-25 NOTE — Patient Instructions (Addendum)
1. Schedule MRI brain with and without contrast  [phone] [facility]  2. Bloodwork for TSH, B12, creatinine 3. Start Donepezil 10mg : take 1/2 tablet daily for 2 weeks, then increase to 1 tablet daily 4. Follow-up in 6 months, call for any changes  FALL PRECAUTIONS: Be cautious when walking. Scan the area for obstacles that may increase the risk of trips and falls. When getting up in the mornings, sit up at the edge of the bed for a few minutes before getting out of bed. Consider elevating the bed at the head end to avoid drop of blood pressure when getting up. Walk always in a well-lit room (use night lights in the walls). Avoid area rugs or power cords from appliances in the middle of the walkways. Use a walker or a cane if necessary and consider physical therapy for balance exercise. Get your eyesight checked regularly.   HOME SAFETY: Consider the safety of the kitchen when operating appliances like stoves, microwave oven, and blender. Consider having supervision and share cooking responsibilities until no longer able to participate in those. Accidents with firearms and other hazards in the house should be identified and addressed as well.  ABILITY TO BE LEFT ALONE: If patient is unable to contact 911 operator, consider using LifeLine, or when the need is there, arrange for someone to stay with patients. Smoking is a fire hazard, consider supervision or cessation. Risk of wandering should be assessed by caregiver and if detected at any point, supervision and safe proof recommendations should be instituted.  MEDICATION SUPERVISION: Inability to self-administer medication needs to be constantly addressed. Implement a mechanism to ensure safe administration of the medications.  RECOMMENDATIONS FOR ALL PATIENTS WITH MEMORY PROBLEMS: 1. Continue to exercise (Recommend 30 minutes of walking everyday, or 3 hours every week) 2. Increase social interactions - continue going to [place] and enjoy  social gatherings with friends and family 3. Eat healthy, avoid fried foods and eat more fruits and vegetables 4. Maintain adequate blood pressure, blood sugar, and blood cholesterol level. Reducing the risk of stroke and cardiovascular disease also helps promoting better memory. 5. Avoid stressful situations. Live a simple life and avoid aggravations. Organize your time and prepare for the next day in anticipation. 6. Sleep well, avoid any interruptions of sleep and avoid any distractions in the bedroom that may interfere with adequate sleep quality 7. Avoid sugar, avoid sweets as there is a strong link between excessive sugar intake, diabetes, and cognitive impairment The Mediterranean diet has been shown to help patients reduce the risk of progressive memory disorders and reduces cardiovascular risk. This includes eating fish, eat fruits and green leafy vegetables, nuts like almonds and hazelnuts, walnuts, and also use olive oil. Avoid fast foods and fried foods as much as possible. Avoid sweets and sugar as sugar use has been linked to worsening of memory function.  There is always a concern of gradual progression of memory problems. If this is the case, then we may need to adjust level of care according to patient needs. Support, both to the patient and caregiver, should then be put into place.

## 2020-02-26 LAB — TSH: TSH: 1.75 mIU/L (ref 0.40–4.50)

## 2020-02-26 LAB — VITAMIN B12: Vitamin B-12: >2000 pg/mL — ABNORMAL HIGH (ref 200–1100)

## 2020-02-26 LAB — CREATININE, SERUM: Creat: 1.57 mg/dL — ABNORMAL HIGH (ref 0.70–1.18)

## 2020-03-22 ENCOUNTER — Other Ambulatory Visit: Payer: Self-pay | Admitting: Neurology

## 2020-04-06 ENCOUNTER — Ambulatory Visit: Payer: Self-pay | Admitting: Urology

## 2020-04-14 IMAGING — CT CT CHEST WITH CONTRAST
2 of 5 series · 12 of 36 positions shown, 15 images · IV contrast (omnipaque)
Comparison: 10/04/2016

CLINICAL DATA: 72-year-old male with a duodenal ulcer

EXAM:
CT CHEST, ABDOMEN, AND PELVIS WITH CONTRAST
TECHNIQUE: Multidetector CT imaging of the chest, abdomen and pelvis was
performed following the standard protocol during bolus
administration of intravenous contrast.
CONTRAST:  100mL OMNIPAQUE IOHEXOL 300 MG/ML  SOLN

[Series 3: cap 5.0 i31f 2 · axial · 0.93mm/px · z∈[+709,+1244]mm · 9 of 131 slices shown, 12 images]
[im 12/131  mediastinal]
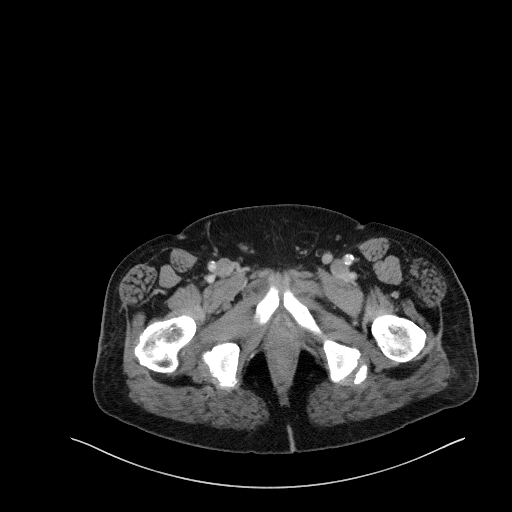
[im 12/131  lung]
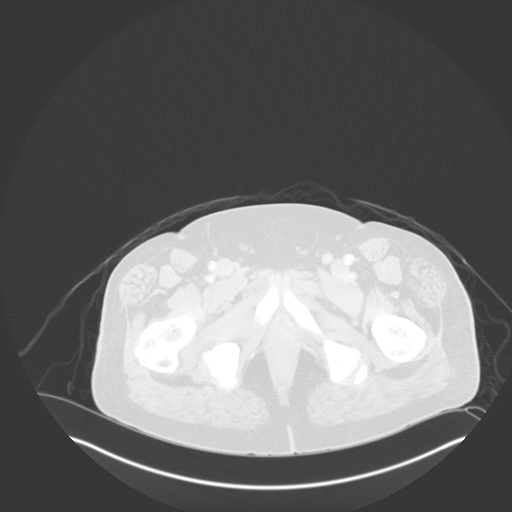
[im 24/131  lung]
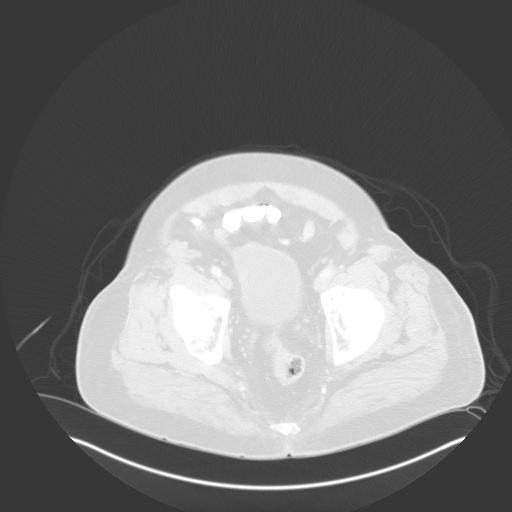
[im 36/131  lung]
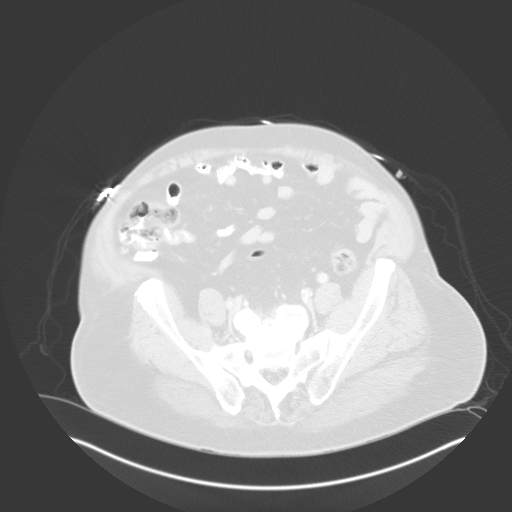
[im 48/131  lung]
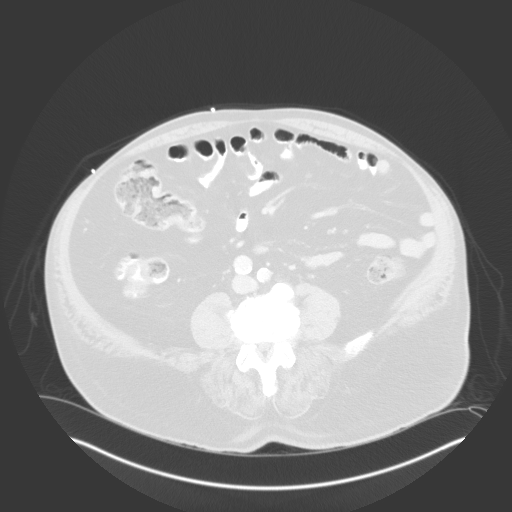
[im 71/131  mediastinal]
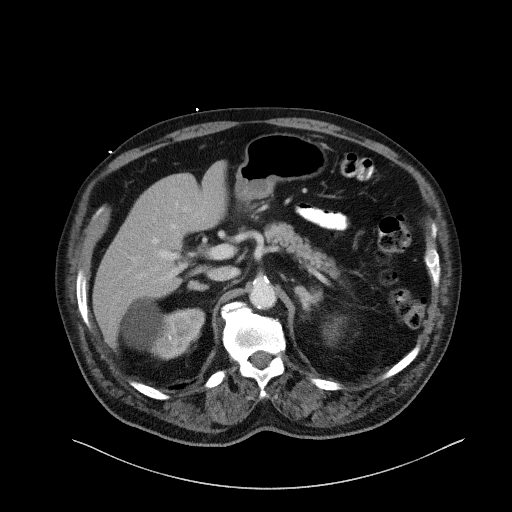
[im 71/131  lung]
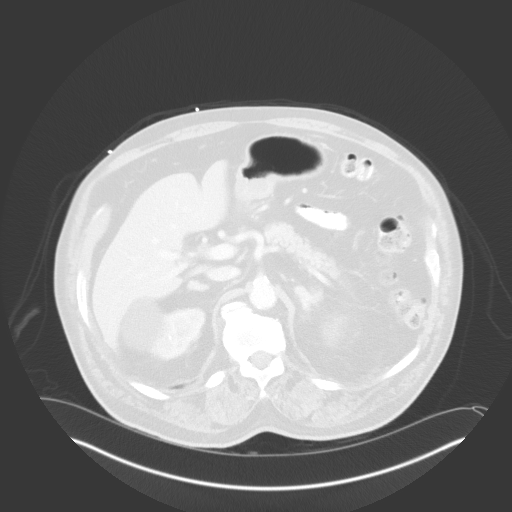
[im 83/131  lung]
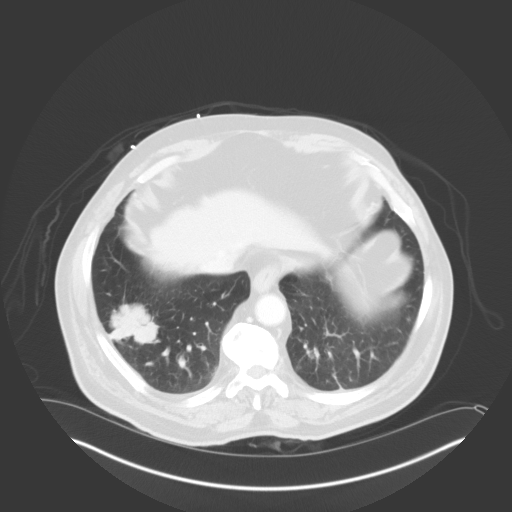
[im 95/131  lung]
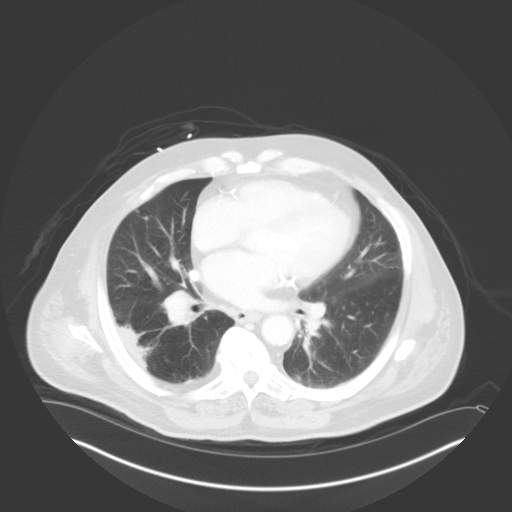
[im 107/131  lung]
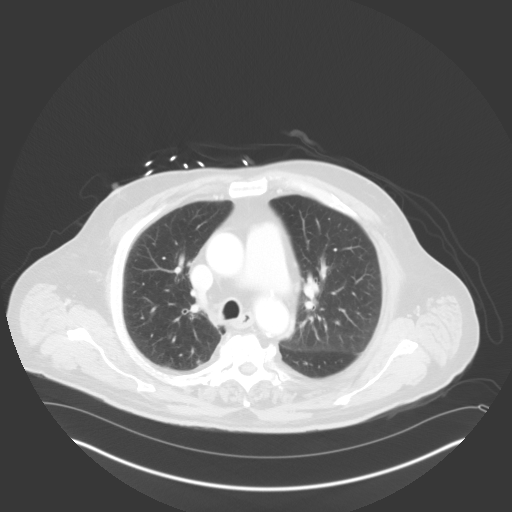
[im 119/131  mediastinal]
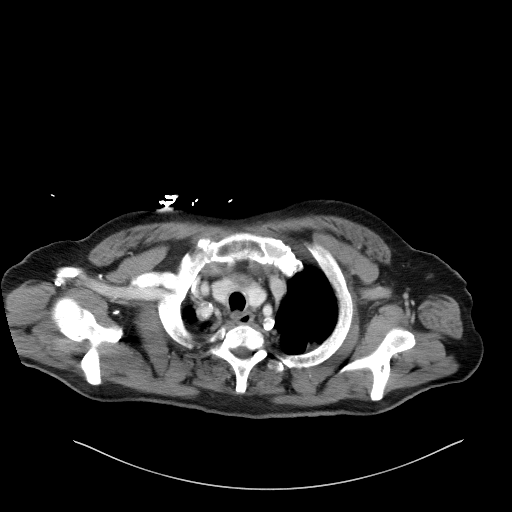
[im 119/131  lung]
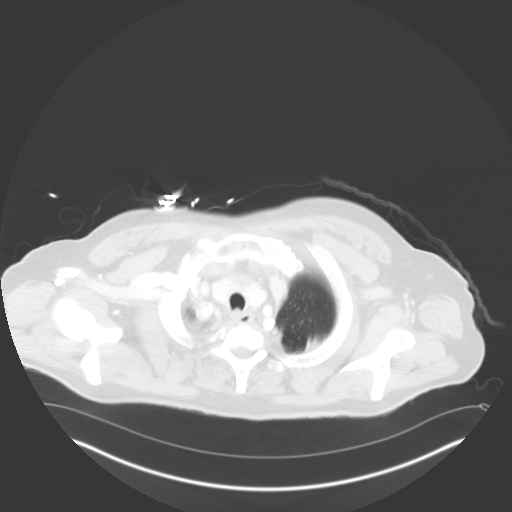

[Series 6: coronal · coronal · 0.92mm/px · 3 of 162 slices shown]
[im 33/162  lung]
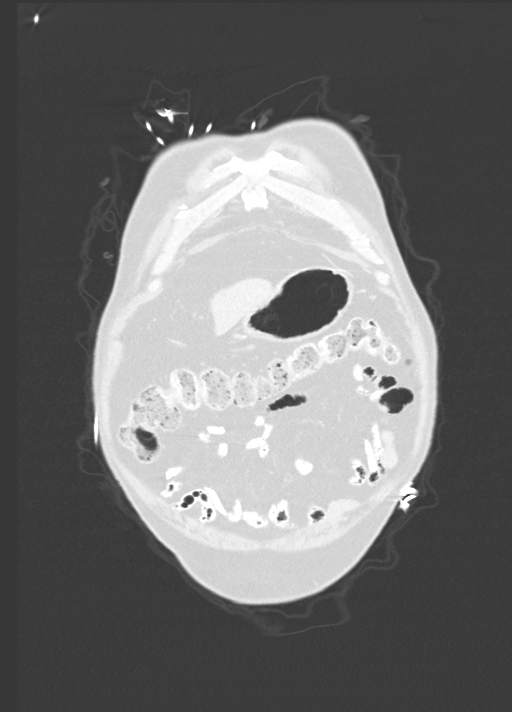
[im 65/162  lung]
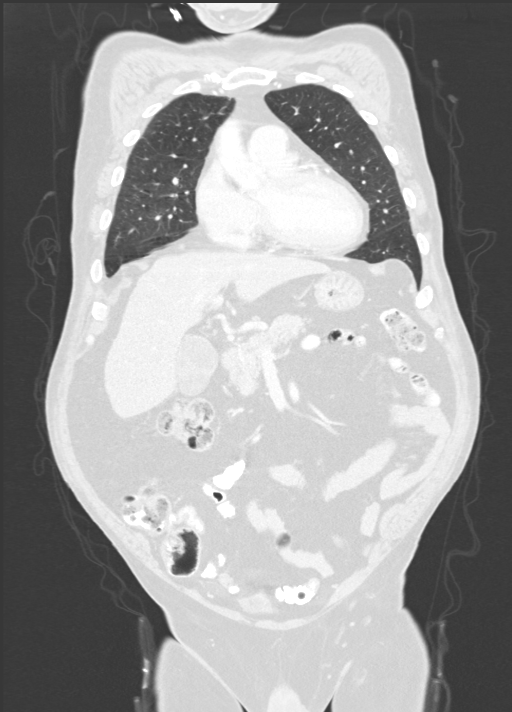
[im 97/162  lung]
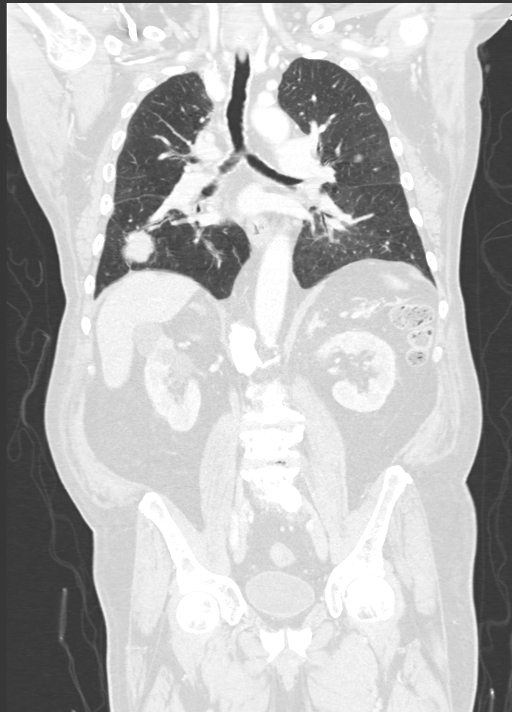

[12 of 36 positions shown; findings below may reference images not displayed]

FINDINGS: CT CHEST FINDINGS

Cardiovascular: Cardiomegaly. No pericardial fluid/thickening. Dense
calcifications of the left main, left anterior descending,
circumflex, right coronary arteries. Normal course caliber and
contour of the thoracic aorta. Patent branch vessels.
Atherosclerotic changes of the aortic arch and descending thoracic
aorta. No aneurysm. Unremarkable visualized pulmonary arteries.

Mediastinum/Nodes: Small lymph nodes of the mediastinum. No
mediastinal adenopathy. Unremarkable course of the thoracic
esophagus. Unremarkable thoracic inlet.

Lungs/Pleura: Partially calcified granulomas of the right lung apex,
similar to the comparison CT of 04/01/2016 and 10/04/2016. No
pneumothorax.

No confluent airspace disease.

Increasing volume of soft tissue in the lateral aspect of the right
lower lobe, with large pleural attachment, largest axial dimension
4.6 cm. This was in the region of the previously biopsied and
treated carcinoma. Increased nodularity on the deep margin of this
plate like soft tissue.

In addition there is a new/enlarging irregular mass of the right
lower lobe measuring 4.8 cm, inferior to the previously treated
squamous cell carcinoma. There is a tail of tissue that extends to
the pleura at the inferior margin at the costophrenic sulcus, as
well as the tail that appears to extend to the diaphragm.

Atelectasis of the dependent aspects of the left lower lobe.

CT ABDOMEN PELVIS FINDINGS

Hepatobiliary: Unremarkable liver. Calcified gallstones with no
inflammatory changes.

Pancreas: Unremarkable pancreas

Spleen: Unremarkable spleen

Adrenals/Urinary Tract: Unremarkable adrenal glands. Left kidney
without hydronephrosis or nephrolithiasis. Unremarkable course of
the left ureter.

Right kidney demonstrates nonobstructing stones in the superior
collecting system, largest measuring 8 mm. Partially septated cyst
of the lateral cortex. Unremarkable course of the right ureter. No
hydronephrosis or inflammatory changes.

Unremarkable urinary bladder.

Stomach/Bowel: Stomach decompressed.

In the region of the proximal duodenum/pylorus, there is gas and
fluid collection involving the wall, which likely corresponds to the
ulcer described on the endoscopy report. I do not see a clear
fistula to the duodenum that was described, however, duodenum is
decompressed. There is no free air or focal fluid. Single small
lymph node within the gastrohepatic ligament.

Remainder of small bowel unremarkable, decompressed. No focal
inflammatory changes.

Appendix is not visualized, however, no inflammatory changes are
present adjacent to the cecum to indicate an appendicitis. Colon is
decompressed with no focal inflammatory changes. Colonic
diverticula.

Vascular/Lymphatic: Atherosclerotic changes of the abdominal aorta
and the bilateral iliac arteries.

No retroperitoneal adenopathy. No free fluid. No pelvic adenopathy.
No inguinal adenopathy.

Reproductive: Transverse diameter of the prostate measures 4.0 cm

Other: Fat containing umbilical hernia.

Musculoskeletal: No acute displaced fracture. No aggressive lytic
lesions or sclerotic lesions. Advanced degenerative changes of the
thoracolumbar spine, with flowing anterior osteophyte production of
the thoracolumbar spine. Multilevel degenerative changes through the
lumbar spine with vacuum disc phenomenon at L3-L4, L4-L5 levels.
IMPRESSION: CT demonstrates evidence of pyloric/duodenal ulcer that was
described today on endoscopy. No evidence of free air/perforation.
There is a single small lymph node within the gastrohepatic
ligament, nonspecific, potentially inflammatory/reactive.

Right lower lobe mass, compatible with either recurrent or new
bronchogenic carcinoma. This is inferior to the previously treated
right lower lobe squamous cell carcinoma. Referral for oncology
evaluation is recommended.

Increasing plate like soft tissue at the periphery of the right
lower lobe, abutting the pleura, in the region of previously treated
squamous cell carcinoma. Recurrence cannot be excluded.

Aortic atherosclerosis and associated coronary artery disease.
Aortic Atherosclerosis (WAIZ3-AD5.5).

Ancillary findings as above.

## 2020-04-17 ENCOUNTER — Other Ambulatory Visit: Payer: Self-pay

## 2020-04-17 ENCOUNTER — Other Ambulatory Visit: Payer: Medicare Other | Admitting: Hospice

## 2020-04-17 DIAGNOSIS — Z515 Encounter for palliative care: Secondary | ICD-10-CM

## 2020-04-17 DIAGNOSIS — C3431 Malignant neoplasm of lower lobe, right bronchus or lung: Secondary | ICD-10-CM

## 2020-04-17 NOTE — Progress Notes (Signed)
[facility] Consult Note Telephone: [phone]  Fax: [phone]  PATIENT NAME: John [name] DOB: 07/15/1946 MRN: [id number]  PRIMARY CARE PROVIDER:   [name], NP  REFERRING PROVIDER:  [name], NP [address] [place],  [place] [address]  RESPONSIBLE PARTY:Self [phone] Emergency contact, Mylon Mabey - daughter- [phone]  TELEHEALTH VISIT STATEMENT Due to the COVID-19 crisis, this visit was done via telephone from my office. It was initiated and consented to by this patient and/or family.   RECOMMENDATIONS/PLAN: 1. Advance Care Planning/Goals of Care:Visit consisted of building trust and follow up on palliative care. Patient is a DNR. MOST selections include limited additional intervention, IV fliud as indicated, antibiotics as indicated. Goals include to maximize quality of life and symptom management. 2. Symptom management:Lynette reports patient in no decline since last visit, no respiratory distress/cough or complaint of pain; patient at his baseline with appetite, mobility. Chart review shows patient seen by Neurologist two months ago for memory loss, resulting in diagnosis of mild dementia; patient no longer able to take care of his bills and finances. On Seroquel for mood/behavior. He continues to be independent in ADLs. FAST 4. John [name] affirmed patient doing well, no acute concerns; he takes his medications as ordered. Encouraged ongoing care. 3. Follow up Palliative Care Visit: Palliative care will continue to follow for goals of care clarification and symptom management.  I spent60minutes providing this consultation; time includes chart review and documentation. More than 50% of the time in this consultation was spent on coordinating advance care communication.  HISTORY OF PRESENT ILLNESS:John E Mooreis a 74 y.o.year oldmalewith multiple medical problems including Right lower  lober lung CA, Diastolic CHF, HTN, CKD 3, Mild dementia, DMPalliative Care was asked to help address goals of care   CODE STATUS: DNR  PPS: 50% HOSPICE ELIGIBILITY/DIAGNOSIS: TBD  PAST MEDICAL HISTORY:  Past Medical History:  Diagnosis Date  . B12 deficiency   . CHF (congestive heart failure) ([place])   . COPD (chronic obstructive pulmonary disease) ([place])   . Diabetes mellitus without complication ([place])   . Folate deficiency   . Gout   . Hyperlipemia   . Hypertension   . 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 MI (myocardial infarction) ([place])   . Pulmonary hypertension ([place])     SOCIAL HX:  Social History   Tobacco Use  . Smoking status: Former Smoker    Packs/day: 1.50    Years: 60.00    Pack years: 90.00    Types: Cigarettes    Quit date: 09/29/2012    Years since quitting: 7.5  . Smokeless tobacco: Never Used  Substance Use Topics  . Alcohol use: No    Alcohol/week: 0.0 standard drinks    ALLERGIES:  Allergies  Allergen Reactions  . Levaquin [Levofloxacin In D5w] Other (See Comments)    Unknown reaction  . Lisinopril Swelling    Facial swelling     PERTINENT MEDICATIONS:  Outpatient Encounter Medications as of 04/17/2020  Medication Sig  . acetaminophen (TYLENOL) 325 MG tablet Take 2 tablets (650 mg total) by mouth every 6 (six) hours as needed for mild pain (or Fever >/= 101).  Marland Kitchen albuterol (PROAIR HFA) 108 (90 BASE) MCG/ACT inhaler INHALE 2 PUFFS BY MOUTH EVERY 4 HOURS AS NEEDED FOR WHEEZING (Patient taking differently: Inhale 2 puffs into the lungs every 4 (four) hours as needed for wheezing. )  .  allopurinol (ZYLOPRIM) 300 MG tablet Take 300 mg by mouth daily.  Marland Kitchen amLODipine (NORVASC) 2.5 MG tablet Take 1 tablet (2.5 mg total) by mouth daily.  Marland Kitchen atorvastatin (LIPITOR) 20 MG tablet Take 1 tablet (20 mg total) by mouth daily.  . bisoprolol (ZEBETA) 5 MG tablet TAKE ONE TABLET BY MOUTH EVERY DAY  . budesonide-formoterol  (SYMBICORT) 160-4.5 MCG/ACT inhaler Inhale 2 puffs into the lungs 2 (two) times daily.  . Cholecalciferol (VITAMIN D3) 50 MCG (2000 UT) capsule Take 2,000 Units by mouth daily.   Marland Kitchen donepezil (ARICEPT) 10 MG tablet Take 1/2 tablet daily for 2 weeks, then increase to 1 tablet daily  . ferrous sulfate 325 (65 FE) MG tablet Take 1 tablet (325 mg total) by mouth every Monday, Wednesday, and Friday.  . Magnesium Oxide 200 MG TABS Take 1 tablet (200 mg total) by mouth 2 (two) times daily.  . megestrol (MEGACE) 400 MG/10ML suspension Take 10 mLs (400 mg total) by mouth daily.  . Multiple Vitamin (MULTIVITAMIN WITH MINERALS) TABS tablet Take 1 tablet by mouth daily. Over the counter  . pantoprazole (PROTONIX) 40 MG tablet Take 1 tablet (40 mg total) by mouth 2 (two) times daily before a meal.  . potassium chloride SA (K-DUR) 20 MEQ tablet Take 1 tablet (20 mEq total) by mouth 2 (two) times daily.  . QUEtiapine (SEROQUEL) 25 MG tablet Take 1 tablet (25 mg total) by mouth 2 (two) times daily.  Marland Kitchen senna-docusate (SENOKOT-S) 8.6-50 MG tablet Take 1 tablet by mouth at bedtime.  . sucralfate (CARAFATE) 1 g tablet Take 1 g by mouth 4 (four) times daily.   . traMADol (ULTRAM) 50 MG tablet Take 1 tablet (50 mg total) by mouth every 6 (six) hours as needed for moderate pain.  . vitamin B-12 1000 MCG tablet Take 1 tablet (1,000 mcg total) by mouth daily.  . Vitamin D, Ergocalciferol, (DRISDOL) 1.25 MG (50000 UT) CAPS capsule Take 50,000 Units by mouth every Friday.    No facility-administered encounter medications on file as of 04/17/2020.    Teodoro Spray, NP

## 2020-04-21 IMAGING — DX PORTABLE CHEST - 1 VIEW
1 series · 2 of 2 positions shown · non-contrast
Comparison: 04/20/2019

CLINICAL DATA: Generalized weakness over the last 3 days. Tremors.
Syncopal episode.

EXAM:
PORTABLE CHEST 1 VIEW

[Series 1: chest · 0.14mm/px · 2 of 2 slices shown]
[im 1/2]
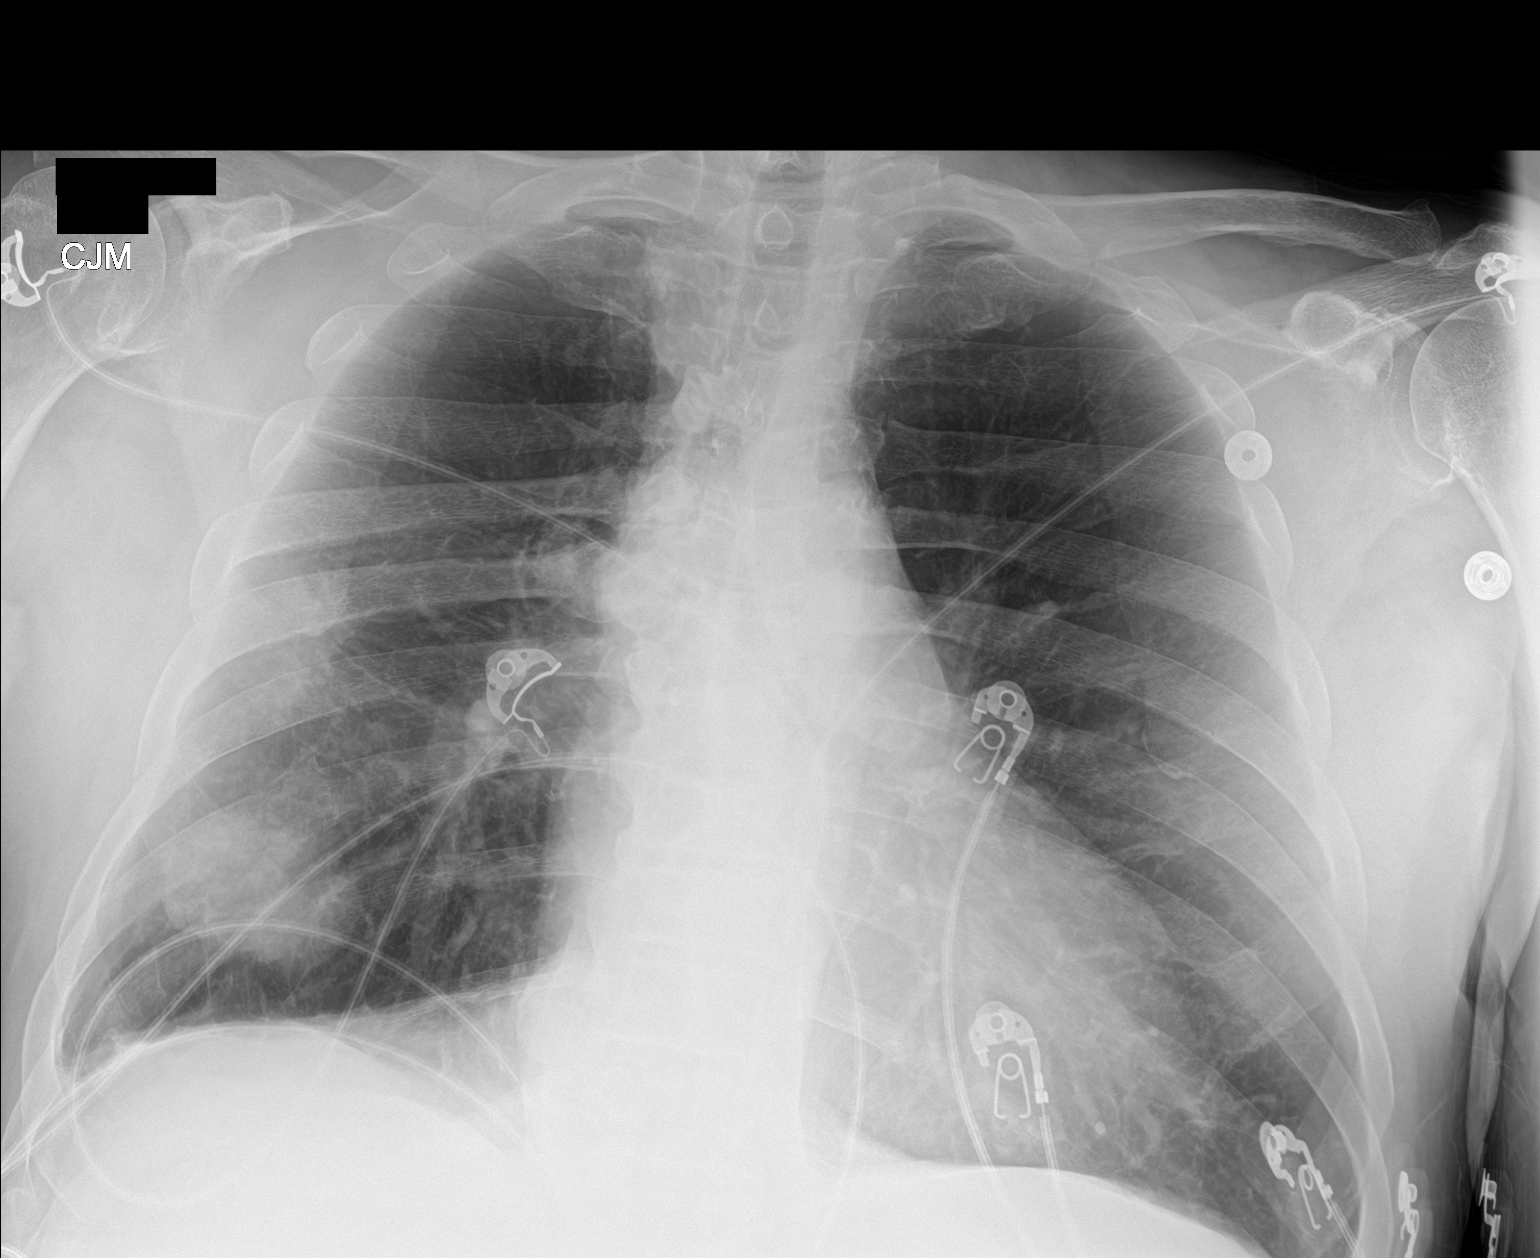
[im 2/2]
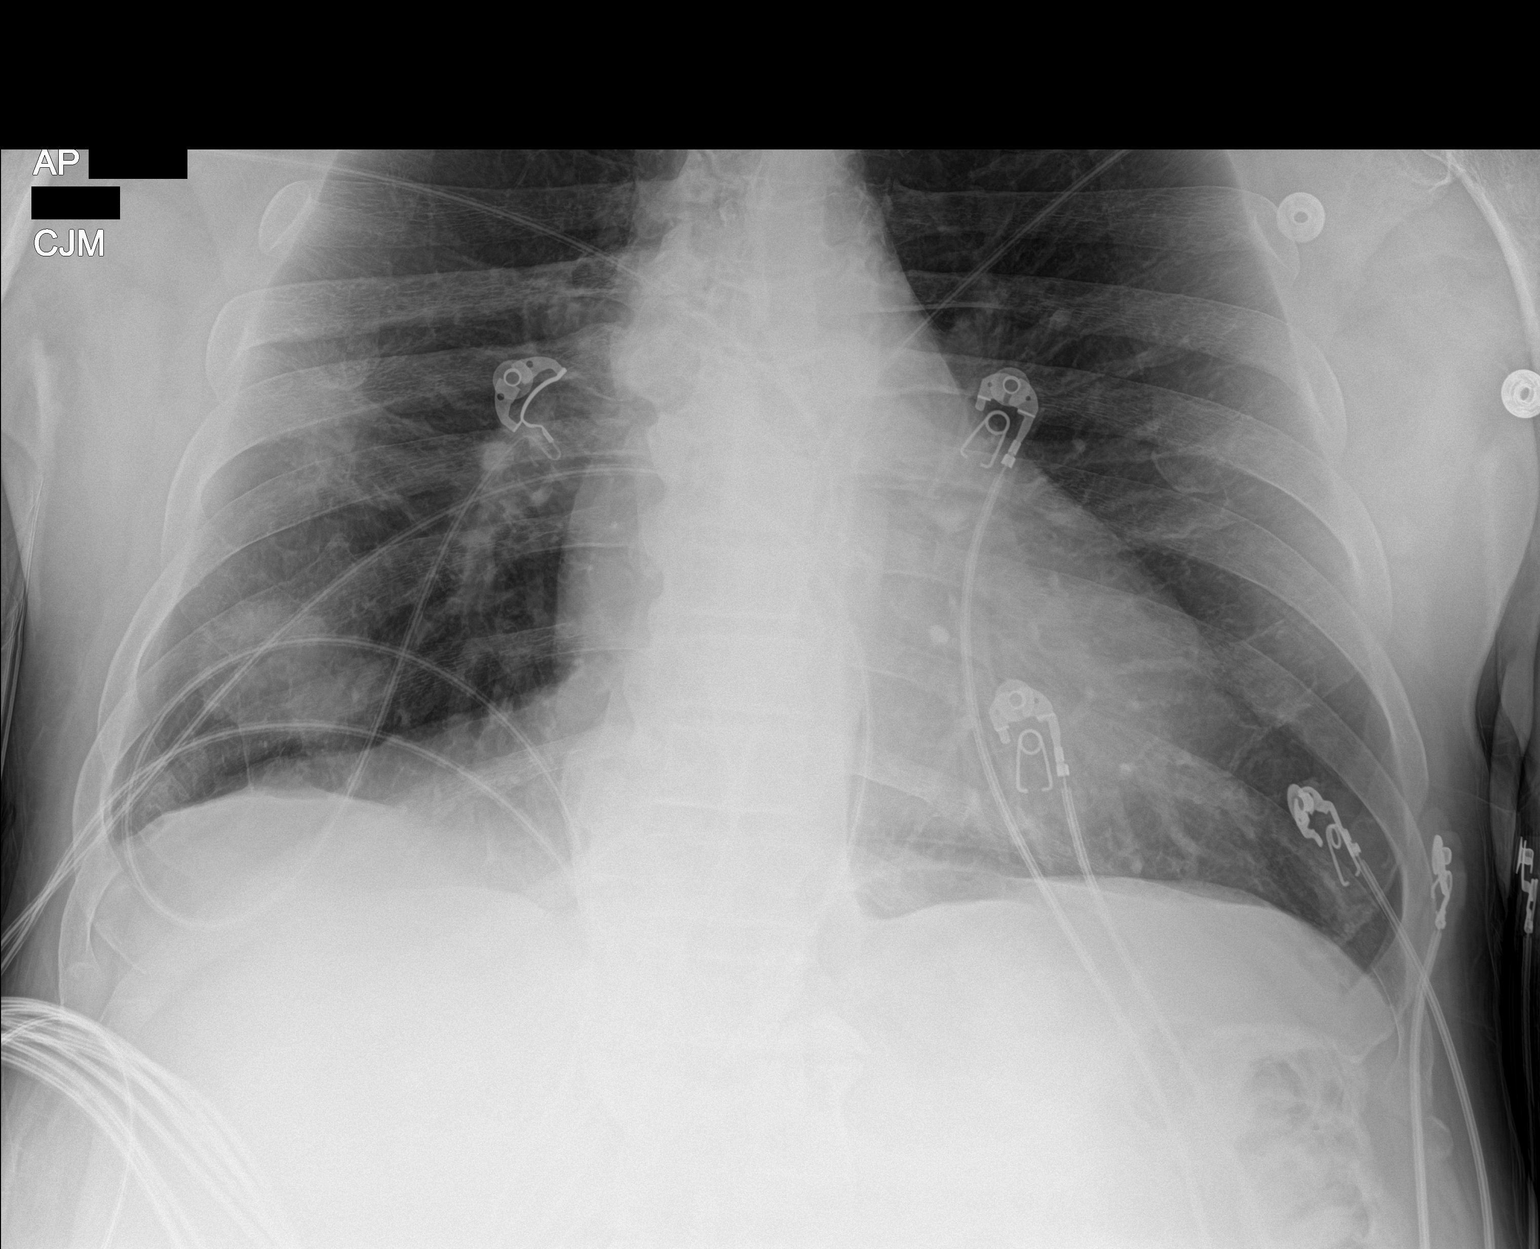

[2 of 2 positions shown; findings below may reference images not displayed]

FINDINGS: Borderline cardiomegaly. Mediastinal shadows are normal. The left
chest is clear. 5 cm mass in the right lower lobe as seen on
previous imaging. No evidence new finding. No heart failure or
effusion. No significant bone abnormality.
IMPRESSION: No change.  5 cm right lower lobe mass.  No other active finding.

## 2020-04-22 IMAGING — US IR EMBO ART  VEN HEMORR LYMPH EXTRAV  INC GUIDE ROADMAPPING
1 series · 2 of 2 positions shown · non-contrast
Comparison: none

INDICATION: Large proximal duodenal ulcer with fistula to pre-pyloric region and
active bleeding and anemia requiring transfusion. Status post
endoscopy with placement of single hemostatic clip at the location
of most significant here into blood clot. The patient presents for
arteriography with probable embolization blood supply to the region
of the bleeding ulcer.

[Series 1: ir embo art ven hemorr lymph extrav inc guide road · 2 of 2 slices shown]
[im 1/2]
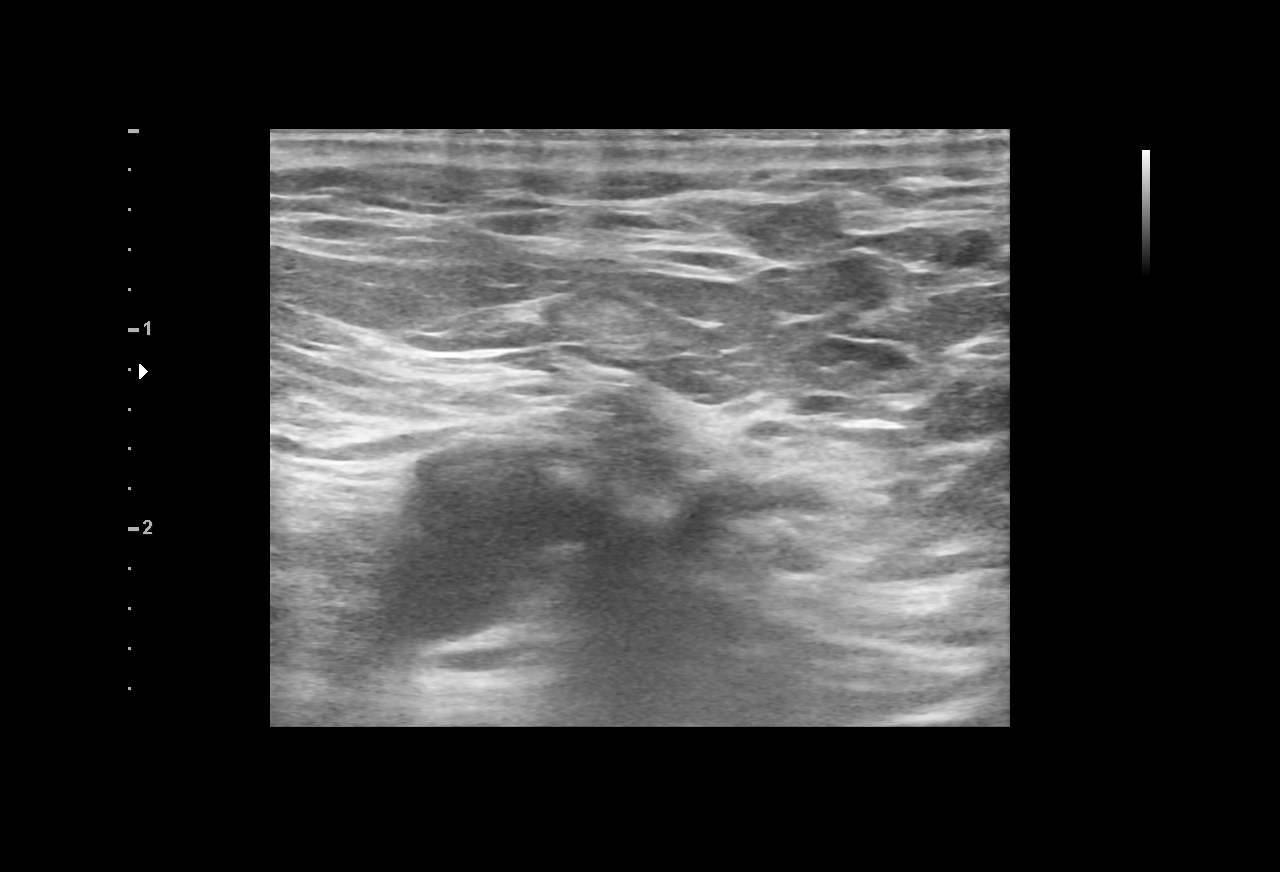
[im 2/2]
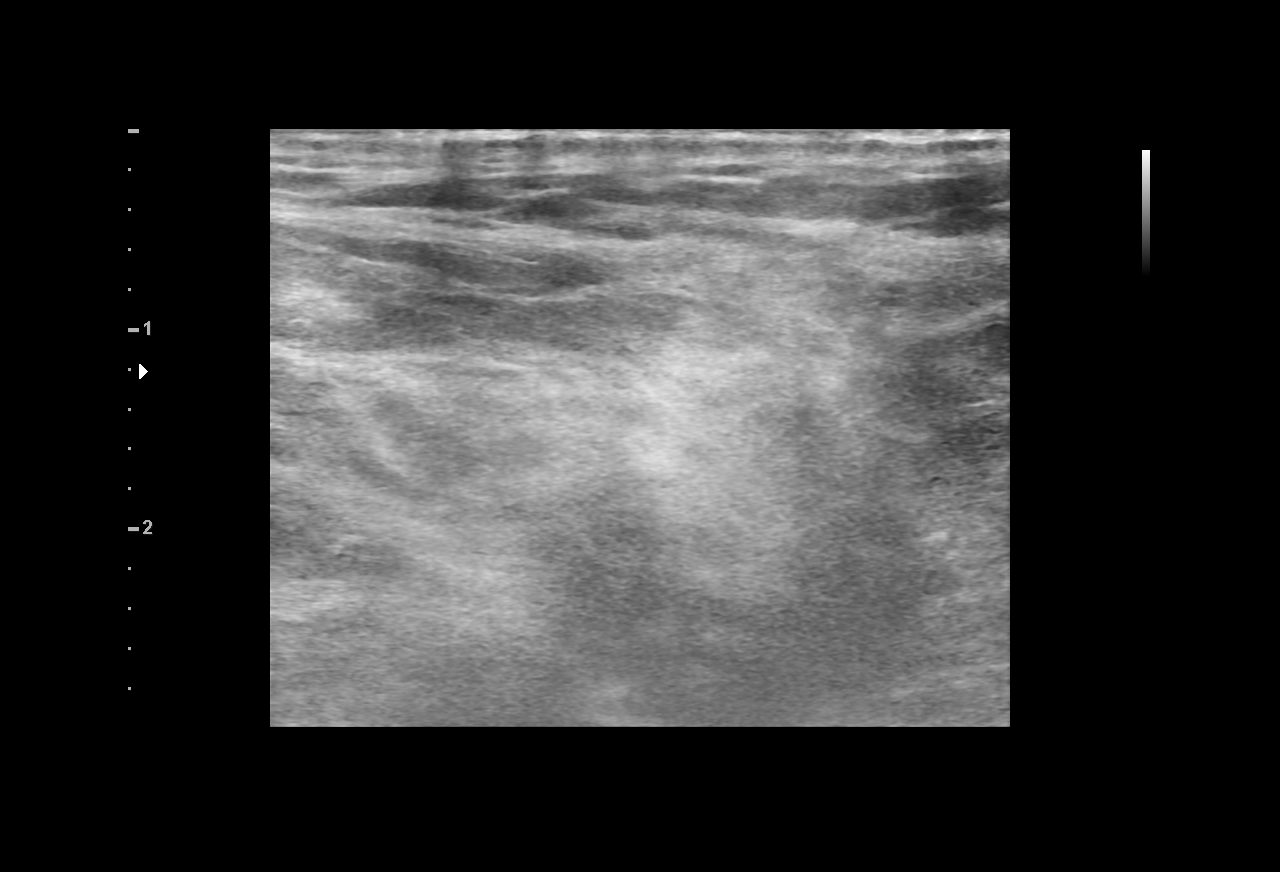

[2 of 2 positions shown; findings below may reference images not displayed]

MEDICATIONS:
None

ANESTHESIA/SEDATION:
The patient was intubated and monitored by Anesthesia during the
procedure. He had been intubated for endoscopy immediately prior to
the arteriographic procedure and was transported to IR directly from
Endoscopy.

CONTRAST:  50 mL Omnipaque 300

FLUOROSCOPY TIME:  Fluoroscopy Time: 16 minutes and 54 seconds. 5457
mGy.

COMPLICATIONS:
None immediate.

PROCEDURE:
Informed consent was obtained from the patient's daughter following
explanation of the procedure, risks, benefits and alternatives. The
patient's daughter understands, agrees and consents for the
procedure. All questions were addressed. A time out was performed
prior to the initiation of the procedure. Maximal barrier sterile
technique utilized including caps, mask, sterile gowns, sterile
gloves, large sterile drape, hand hygiene, and chlorhexidine prep.

Ultrasound was utilized to confirm patency of the right common
femoral artery. Local anesthesia was provided with 1% lidocaine. The
right common femoral artery was accessed utilizing a 21 gauge needle
and micropuncture set under ultrasound guidance. Ultrasound image
documentation was performed.

A 5 French sheath was placed over a guidewire. A 5 French Cobra
catheter was advanced into the abdominal aorta. Selective
arteriography was initially performed of a trunk off of the aorta
supplying the right inferior phrenic artery. The catheter was then
used to selectively catheterize the celiac axis. Selective
arteriography was performed of the celiac axis.

The 5 French catheter was further advanced into the common hepatic
artery and selective arteriography performed. A microcatheter was
then advanced through the 5 French catheter into the gastroduodenal
artery and selective arteriography performed. Attempt was made to
catheterize a branch of the gastroduodenal artery.

Transcatheter embolization was performed of the gastroduodenal
artery via the microcatheter with placement of multiple embolization
coils. A total of 8 Interlock 0.018 inch microcoils ranging in
diameter from 4 mm up to 6 mm of various lengths were placed.
Periodic arteriography was performed through the microcatheter
during coil embolization. Additional arteriography was then
performed at the level of the common hepatic artery via the 5 French
Cobra catheter. The catheter was removed.

Oblique arteriography was performed at the level of the common
femoral access via the sheath. Femoral closure was performed with
the Cordis ExoSeal device.
FINDINGS: Initial catheterization was performed of a trunk just adjacent to
the celiac trunk supplying the right inferior phrenic artery and an
additional left-sided branch that appears to represent an accessory
gastric artery.

Celiac arteriography demonstrates typical branching pattern with
patent splenic, left gastric and common hepatic arteries identified.
The gastroduodenal artery emanates off of the common hepatic artery
and normally patent proper, right and left hepatic arteries are
identified.

Common hepatic arteriography demonstrates typical anatomy of the
gastroduodenal artery as well as a gastroepiploic artery. In an
oblique projection, there was evidence of an arterial pseudoaneurysm
of a gastroduodenal branch representing a transverse proximal branch
of the GDA supplying the region of the duodenal bulb/distal stomach.
This pseudoaneurysm is located along the lateral aspect of the
hemostatic clip placed at the time of endoscopy.

Due to transverse takeoff of the arterial branch and small caliber
of the branch, a microcatheter could not be advanced into the
duodenal branch to allow selective embolization of the branch
supplying the pseudoaneurysm. Embolization was therefore performed
of the gastroduodenal artery itself including across the origin of
the offending branch. Gastroduodenal arteriography demonstrates
additional normal duodenal branches and a normal gastroepiploic
artery. Embolization of the gastroduodenal trunk was performed
successfully nearly to the orifice of the GDA. This resulted in
complete occlusion of the artery including the transverse duodenal
branch supplying the arterial pseudoaneurysm. There was preserved
flow in the hepatic arteries.
IMPRESSION: Arteriography demonstrates focal abnormal arterial pseudoaneurysm of
a proximal duodenal branch of the gastroduodenal artery supplying
the region of the proximal duodenum/distal stomach. This is located
immediately adjacent to the hemostatic clip placed at the time of
endoscopy. The gastroduodenal artery was successfully occluded with
embolization coils including across the origin of the abnormal
proximal duodenal branch resulting in complete occlusion of the
branch supplying the pseudoaneurysm.

## 2020-05-18 ENCOUNTER — Other Ambulatory Visit: Payer: Self-pay

## 2020-05-18 ENCOUNTER — Other Ambulatory Visit: Payer: Medicare Other | Admitting: Hospice

## 2020-05-18 DIAGNOSIS — Z515 Encounter for palliative care: Secondary | ICD-10-CM

## 2020-05-18 DIAGNOSIS — C3431 Malignant neoplasm of lower lobe, right bronchus or lung: Secondary | ICD-10-CM

## 2020-05-18 NOTE — Progress Notes (Signed)
[facility] Consult Note Telephone: [phone]  Fax: [phone]  PATIENT NAME: John [name] DOB: Dec 01, 1946 MRN: [id number]  PRIMARY CARE PROVIDER:   [name], NP  REFERRING PROVIDER: [name], NP  RESPONSIBLE PARTY:Self [phone] Emergency contact, Thaniel Coluccio - daughter- [phone]    RECOMMENDATIONS/PLAN:  Advance Care Planning/Goals of Care:Visit consisted of building trustand follow up on palliative care. Patient is a DNR.MOST selections include limited additional intervention, IV fliud as indicated, antibiotics as indicated. Goals include to maximize quality of life and symptom management. Symptom management:Patient in no acute distress; he denies pain/discomfort; no coughing no shortness of breath. Patient is able to walk around, do some yard work. He says doing some work helps him stay strong and prevent shortness of breath. Validation provided; education provided to rest in between activities if he needs to.  He said overall he is doing well, appetite is good. PPS increased from 50% to 60%, FLACC 0. There has been no fall, no hospitalization since last visit. He reports no changes to his medication.  Chart review shows patient seen by Neurologist February 2021 for memory loss, resulting in diagnosis of mild dementia; patient no longer able to take care of his bills and finances. On Seroquel for mood/behavior. He continues to be independent in ADLs. FAST 4. Patient with no concerns at this time.   Encouraged ongoing care at home, and ongoing compliance to medications and plan of care.   Follow up Palliative Care Visit: Palliative care will continue to follow for goals of care clarification and symptom management.  I spent15minutes providing this consultation; time includes chart review and documentation. More than 50% of the time in this consultation was spent on coordinating advance care  communication.  HISTORY OF PRESENT ILLNESS:John E Mooreis a 74 y.o.year oldmalewith multiple medical problems including Right lower lober lung CA, Diastolic CHF, HTN, CKD 3, Mild dementia, DMPalliative Care was asked to help address goals of care  CODE STATUS: DNR  PPS: 60% HOSPICE ELIGIBILITY/DIAGNOSIS: TBD  PAST MEDICAL HISTORY:  Past Medical History:  Diagnosis Date  . B12 deficiency   . CHF (congestive heart failure) ([place])   . COPD (chronic obstructive pulmonary disease) ([place])   . Diabetes mellitus without complication ([place])   . Folate deficiency   . Gout   . Hyperlipemia   . Hypertension   . 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 MI (myocardial infarction) ([place])   . Pulmonary hypertension ([place])     SOCIAL HX:  Social History   Tobacco Use  . Smoking status: Former Smoker    Packs/day: 1.50    Years: 60.00    Pack years: 90.00    Types: Cigarettes    Quit date: 09/29/2012    Years since quitting: 7.6  . Smokeless tobacco: Never Used  Substance Use Topics  . Alcohol use: No    Alcohol/week: 0.0 standard drinks    ALLERGIES:  Allergies  Allergen Reactions  . Levaquin [Levofloxacin In D5w] Other (See Comments)    Unknown reaction  . Lisinopril Swelling    Facial swelling     PERTINENT MEDICATIONS:  Outpatient Encounter Medications as of 05/18/2020  Medication Sig  . acetaminophen (TYLENOL) 325 MG tablet Take 2 tablets (650 mg total) by mouth every 6 (six) hours as needed for mild pain (or Fever >/= 101).  Marland Kitchen albuterol (PROAIR HFA) 108 (90 BASE) MCG/ACT inhaler INHALE 2  PUFFS BY MOUTH EVERY 4 HOURS AS NEEDED FOR WHEEZING (Patient taking differently: Inhale 2 puffs into the lungs every 4 (four) hours as needed for wheezing. )  . allopurinol (ZYLOPRIM) 300 MG tablet Take 300 mg by mouth daily.  Marland Kitchen amLODipine (NORVASC) 2.5 MG tablet Take 1 tablet (2.5 mg total) by mouth daily.  Marland Kitchen atorvastatin (LIPITOR) 20 MG tablet Take  1 tablet (20 mg total) by mouth daily.  . bisoprolol (ZEBETA) 5 MG tablet TAKE ONE TABLET BY MOUTH EVERY DAY  . budesonide-formoterol (SYMBICORT) 160-4.5 MCG/ACT inhaler Inhale 2 puffs into the lungs 2 (two) times daily.  . Cholecalciferol (VITAMIN D3) 50 MCG (2000 UT) capsule Take 2,000 Units by mouth daily.   Marland Kitchen donepezil (ARICEPT) 10 MG tablet Take 1/2 tablet daily for 2 weeks, then increase to 1 tablet daily  . ferrous sulfate 325 (65 FE) MG tablet Take 1 tablet (325 mg total) by mouth every Monday, Wednesday, and Friday.  . Magnesium Oxide 200 MG TABS Take 1 tablet (200 mg total) by mouth 2 (two) times daily.  . megestrol (MEGACE) 400 MG/10ML suspension Take 10 mLs (400 mg total) by mouth daily.  . Multiple Vitamin (MULTIVITAMIN WITH MINERALS) TABS tablet Take 1 tablet by mouth daily. Over the counter  . pantoprazole (PROTONIX) 40 MG tablet Take 1 tablet (40 mg total) by mouth 2 (two) times daily before a meal.  . potassium chloride SA (K-DUR) 20 MEQ tablet Take 1 tablet (20 mEq total) by mouth 2 (two) times daily.  . QUEtiapine (SEROQUEL) 25 MG tablet Take 1 tablet (25 mg total) by mouth 2 (two) times daily.  Marland Kitchen senna-docusate (SENOKOT-S) 8.6-50 MG tablet Take 1 tablet by mouth at bedtime.  . sucralfate (CARAFATE) 1 g tablet Take 1 g by mouth 4 (four) times daily.   . traMADol (ULTRAM) 50 MG tablet Take 1 tablet (50 mg total) by mouth every 6 (six) hours as needed for moderate pain.  . vitamin B-12 1000 MCG tablet Take 1 tablet (1,000 mcg total) by mouth daily.  . Vitamin D, Ergocalciferol, (DRISDOL) 1.25 MG (50000 UT) CAPS capsule Take 50,000 Units by mouth every Friday.    No facility-administered encounter medications on file as of 05/18/2020.    PHYSICAL EXAM/ROS:  General: NAD, cooperative Cardiovascular: regular rate and rhythm; denies chest pain Pulmonary: clear ant//post fields; normal respiratory effort, no adventitious sounds auscultated Abdomen: soft, nontender, + bowel  sounds GU: no suprapubic tenderness Extremities: no edema, no joint deformities Skin: no rashes on exposed skin Neurological: Weakness but otherwise nonfocal  Teodoro Spray, NP

## 2020-06-01 ENCOUNTER — Other Ambulatory Visit: Payer: Self-pay | Admitting: Neurology

## 2020-06-12 ENCOUNTER — Telehealth: Payer: Self-pay | Admitting: *Deleted

## 2020-06-12 ENCOUNTER — Inpatient Hospital Stay: Payer: Medicare Other | Attending: Internal Medicine

## 2020-06-12 ENCOUNTER — Other Ambulatory Visit: Payer: Self-pay | Admitting: *Deleted

## 2020-06-12 ENCOUNTER — Other Ambulatory Visit: Payer: Self-pay

## 2020-06-12 DIAGNOSIS — I2510 Atherosclerotic heart disease of native coronary artery without angina pectoris: Secondary | ICD-10-CM | POA: Insufficient documentation

## 2020-06-12 DIAGNOSIS — I252 Old myocardial infarction: Secondary | ICD-10-CM | POA: Diagnosis not present

## 2020-06-12 DIAGNOSIS — Z923 Personal history of irradiation: Secondary | ICD-10-CM | POA: Diagnosis not present

## 2020-06-12 DIAGNOSIS — E785 Hyperlipidemia, unspecified: Secondary | ICD-10-CM | POA: Insufficient documentation

## 2020-06-12 DIAGNOSIS — Z85118 Personal history of other malignant neoplasm of bronchus and lung: Secondary | ICD-10-CM | POA: Insufficient documentation

## 2020-06-12 DIAGNOSIS — I509 Heart failure, unspecified: Secondary | ICD-10-CM | POA: Insufficient documentation

## 2020-06-12 DIAGNOSIS — J449 Chronic obstructive pulmonary disease, unspecified: Secondary | ICD-10-CM | POA: Insufficient documentation

## 2020-06-12 DIAGNOSIS — C3431 Malignant neoplasm of lower lobe, right bronchus or lung: Secondary | ICD-10-CM

## 2020-06-12 DIAGNOSIS — E119 Type 2 diabetes mellitus without complications: Secondary | ICD-10-CM | POA: Insufficient documentation

## 2020-06-12 DIAGNOSIS — I110 Hypertensive heart disease with heart failure: Secondary | ICD-10-CM | POA: Diagnosis not present

## 2020-06-12 DIAGNOSIS — Z79899 Other long term (current) drug therapy: Secondary | ICD-10-CM | POA: Diagnosis not present

## 2020-06-12 DIAGNOSIS — E538 Deficiency of other specified B group vitamins: Secondary | ICD-10-CM | POA: Diagnosis not present

## 2020-06-12 DIAGNOSIS — Z7951 Long term (current) use of inhaled steroids: Secondary | ICD-10-CM | POA: Insufficient documentation

## 2020-06-12 LAB — CBC WITH DIFFERENTIAL (CANCER CENTER ONLY)
Abs Immature Granulocytes: 0.04 10*3/uL (ref 0.00–0.07)
Basophils Absolute: 0 10*3/uL (ref 0.0–0.1)
Basophils Relative: 0 %
Eosinophils Absolute: 0.3 10*3/uL (ref 0.0–0.5)
Eosinophils Relative: 3 %
HCT: 31.1 % — ABNORMAL LOW (ref 39.0–52.0)
Hemoglobin: 9.8 g/dL — ABNORMAL LOW (ref 13.0–17.0)
Immature Granulocytes: 1 %
Lymphocytes Relative: 13 %
Lymphs Abs: 1 10*3/uL (ref 0.7–4.0)
MCH: 31.4 pg (ref 26.0–34.0)
MCHC: 31.5 g/dL (ref 30.0–36.0)
MCV: 99.7 fL (ref 80.0–100.0)
Monocytes Absolute: 1 10*3/uL (ref 0.1–1.0)
Monocytes Relative: 12 %
Neutro Abs: 5.8 10*3/uL (ref 1.7–7.7)
Neutrophils Relative %: 71 %
Platelet Count: 222 10*3/uL (ref 150–400)
RBC: 3.12 MIL/uL — ABNORMAL LOW (ref 4.22–5.81)
RDW: 14.7 % (ref 11.5–15.5)
WBC Count: 8.1 10*3/uL (ref 4.0–10.5)
nRBC: 0 % (ref 0.0–0.2)

## 2020-06-12 LAB — CMP (CANCER CENTER ONLY)
ALT: <6 U/L (ref 0–44)
AST: 9 U/L — ABNORMAL LOW (ref 15–41)
Albumin: 3.6 g/dL (ref 3.5–5.0)
Alkaline Phosphatase: 86 U/L (ref 38–126)
Anion gap: 11 (ref 5–15)
BUN: 26 mg/dL — ABNORMAL HIGH (ref 8–23)
CO2: 30 mmol/L (ref 22–32)
Calcium: 9.4 mg/dL (ref 8.9–10.3)
Chloride: 105 mmol/L (ref 98–111)
Creatinine: 1.9 mg/dL — ABNORMAL HIGH (ref 0.61–1.24)
GFR, Est AFR Am: 39 mL/min — ABNORMAL LOW (ref >60)
GFR, Estimated: 34 mL/min — ABNORMAL LOW (ref >60)
Glucose, Bld: 103 mg/dL — ABNORMAL HIGH (ref 70–99)
Potassium: 4 mmol/L (ref 3.5–5.1)
Sodium: 146 mmol/L — ABNORMAL HIGH (ref 135–145)
Total Bilirubin: 0.4 mg/dL (ref 0.3–1.2)
Total Protein: 7 g/dL (ref 6.5–8.1)

## 2020-06-12 NOTE — Telephone Encounter (Signed)
Attempted to reach patient to advise that he needs to have his CT done prior to seeing Dr. Julien Nordmann.  Also due to his elevated Crt levels we changed his order to wo contrast.  Attempted to reach patient, left voicemail, pending call back.  If he is unable to get CT scheduled before Dr. Worthy Flank visit he will need to reschedule John [name]'s visit.

## 2020-06-13 ENCOUNTER — Telehealth: Payer: Self-pay | Admitting: Internal Medicine

## 2020-06-13 ENCOUNTER — Telehealth: Payer: Self-pay

## 2020-06-13 NOTE — Telephone Encounter (Signed)
R/s appt per 6/15 sch message  - unable to reach pt . Left message with new appt date and time

## 2020-06-13 NOTE — Telephone Encounter (Signed)
To reschedule palliative care appt from June 17 th at 3 pm to July 8 th at 3 pm, awaiting confirmation. Also my chart message sent with request.

## 2020-06-14 ENCOUNTER — Inpatient Hospital Stay: Payer: Medicare Other | Admitting: Internal Medicine

## 2020-06-19 ENCOUNTER — Telehealth: Payer: Self-pay | Admitting: Medical Oncology

## 2020-06-19 NOTE — Telephone Encounter (Signed)
LVM to return my call if needed. I called pt and told Willette Cluster he has CT tomorrow. She confirmed. Dtr states he does not know how he ended up at [facility] PT today.  She is in communication with Cancer center transportation about his ride today to the wrong place.

## 2020-06-20 ENCOUNTER — Encounter (HOSPITAL_COMMUNITY): Payer: Self-pay

## 2020-06-20 ENCOUNTER — Ambulatory Visit (HOSPITAL_COMMUNITY)
Admission: RE | Admit: 2020-06-20 | Discharge: 2020-06-20 | Disposition: A | Discharge status: Home / Self Care | Payer: Medicare Other | Source: Ambulatory Visit | Attending: Physician Assistant | Admitting: Physician Assistant

## 2020-06-20 ENCOUNTER — Other Ambulatory Visit: Payer: Self-pay

## 2020-06-20 DIAGNOSIS — C3431 Malignant neoplasm of lower lobe, right bronchus or lung: Secondary | ICD-10-CM | POA: Diagnosis not present

## 2020-06-27 ENCOUNTER — Encounter: Payer: Self-pay | Admitting: Internal Medicine

## 2020-06-27 ENCOUNTER — Inpatient Hospital Stay (HOSPITAL_BASED_OUTPATIENT_CLINIC_OR_DEPARTMENT_OTHER): Payer: Medicare Other | Admitting: Internal Medicine

## 2020-06-27 ENCOUNTER — Other Ambulatory Visit: Payer: Self-pay

## 2020-06-27 VITALS — BP 145/74 | HR 79 | Temp 98.1°F | Resp 22 | Ht 69.0 in | Wt 162.3 lb

## 2020-06-27 DIAGNOSIS — C3490 Malignant neoplasm of unspecified part of unspecified bronchus or lung: Secondary | ICD-10-CM | POA: Diagnosis not present

## 2020-06-27 DIAGNOSIS — C3431 Malignant neoplasm of lower lobe, right bronchus or lung: Secondary | ICD-10-CM

## 2020-06-27 DIAGNOSIS — Z85118 Personal history of other malignant neoplasm of bronchus and lung: Secondary | ICD-10-CM | POA: Diagnosis not present

## 2020-06-27 NOTE — Progress Notes (Signed)
[place] [phone]   [phone]  OFFICE PROGRESS NOTE  [name], NP [address] [place] [address]  DIAGNOSIS: Recurrent non-small cell lung cancer initially diagnosed as stage IA (T1b, N0, M0) non-small cell lung cancer, squamous cell carcinoma diagnosed in June 2016.  The patient has disease recurrence in May 2020.  PRIOR THERAPY:  1) Course of curative radiotherapy to the right lower lobe lung nodule as well as mediastinal lymphadenopathy under the care of Dr. Tammi Klippel. 2) SBRT to the large solid mass in the right lower lobe under the care of Dr. Tammi Klippel.  Completed 09/03/2019.  CURRENT THERAPY: Observation.  INTERVAL HISTORY: John [name] 74 y.o. male returns to the clinic today for follow-up visit.  The patient is feeling fine today with no concerning complaints except for the baseline shortness of breath and he is currently on home oxygen but he did not bring his oxygen tank with him today.  He denied having any chest pain, cough or hemoptysis.  He denied having any nausea, vomiting, diarrhea or constipation.  He has no recent weight loss or night sweats.  The patient is here today for evaluation and repeat CT scan of the chest for restaging of his disease.  MEDICAL HISTORY: Past Medical History:  Diagnosis Date  . B12 deficiency   . CHF (congestive heart failure) ([facility])   . COPD (chronic obstructive pulmonary disease) ([facility])   . Diabetes mellitus without complication ([facility])   . Folate deficiency   . Gout   . Hyperlipemia   . Hypertension   . 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 MI (myocardial infarction) ([place])   . Pulmonary hypertension ([facility])     ALLERGIES:  is allergic to levaquin [levofloxacin in d5w] and lisinopril.  MEDICATIONS:  Current Outpatient Medications  Medication Sig Dispense Refill  . acetaminophen (TYLENOL) 325 MG tablet Take 2 tablets (650 mg  total) by mouth every 6 (six) hours as needed for mild pain (or Fever >/= 101).    Marland Kitchen albuterol (PROAIR HFA) 108 (90 BASE) MCG/ACT inhaler INHALE 2 PUFFS BY MOUTH EVERY 4 HOURS AS NEEDED FOR WHEEZING (Patient taking differently: Inhale 2 puffs into the lungs every 4 (four) hours as needed for wheezing. ) 1 Inhaler 11  . allopurinol (ZYLOPRIM) 300 MG tablet Take 300 mg by mouth daily.    Marland Kitchen amLODipine (NORVASC) 2.5 MG tablet Take 1 tablet (2.5 mg total) by mouth daily. 30 tablet 3  . atorvastatin (LIPITOR) 20 MG tablet Take 1 tablet (20 mg total) by mouth daily. 30 tablet 0  . bisoprolol (ZEBETA) 5 MG tablet TAKE ONE TABLET BY MOUTH DAILY 31 tablet 0  . budesonide-formoterol (SYMBICORT) 160-4.5 MCG/ACT inhaler Inhale 2 puffs into the lungs 2 (two) times daily.    . Cholecalciferol (VITAMIN D3) 50 MCG (2000 UT) capsule Take 2,000 Units by mouth daily.     Marland Kitchen donepezil (ARICEPT) 10 MG tablet Take 1/2 tablet daily for 2 weeks, then increase to 1 tablet daily 30 tablet 11  . Magnesium Oxide 200 MG TABS Take 1 tablet (200 mg total) by mouth 2 (two) times daily. 60 tablet 0  . megestrol (MEGACE) 400 MG/10ML suspension Take 10 mLs (400 mg total) by mouth daily. 240 mL 1  . Multiple Vitamin (MULTIVITAMIN WITH MINERALS) TABS tablet Take 1 tablet by mouth daily. Over the counter 30 tablet 3  . pantoprazole (PROTONIX)  40 MG tablet Take 1 tablet (40 mg total) by mouth 2 (two) times daily before a meal. 180 tablet 3  . potassium chloride SA (K-DUR) 20 MEQ tablet Take 1 tablet (20 mEq total) by mouth 2 (two) times daily. 14 tablet 0  . QUEtiapine (SEROQUEL) 25 MG tablet Take 1 tablet (25 mg total) by mouth 2 (two) times daily. 60 tablet 0  . senna-docusate (SENOKOT-S) 8.6-50 MG tablet Take 1 tablet by mouth at bedtime. 30 tablet 0  . sucralfate (CARAFATE) 1 g tablet Take 1 g by mouth 4 (four) times daily.     . traMADol (ULTRAM) 50 MG tablet Take 1 tablet (50 mg total) by mouth every 6 (six) hours as needed for  moderate pain. 15 tablet 0  . vitamin B-12 1000 MCG tablet Take 1 tablet (1,000 mcg total) by mouth daily. 30 tablet 3  . Vitamin D, Ergocalciferol, (DRISDOL) 1.25 MG (50000 UT) CAPS capsule Take 50,000 Units by mouth every Friday.     . ferrous sulfate 325 (65 FE) MG tablet Take 1 tablet (325 mg total) by mouth every Monday, Wednesday, and Friday. 30 tablet 2   No current facility-administered medications for this visit.    SURGICAL HISTORY:  Past Surgical History:  Procedure Laterality Date  . APPENDECTOMY    . BIOPSY  04/21/2019   Procedure: BIOPSY;  Surgeon: Gatha Mayer, MD;  Location: [place] [facility];  Service: Endoscopy;;  . ESOPHAGOGASTRODUODENOSCOPY (EGD) WITH PROPOFOL N/A 04/21/2019   Procedure: ESOPHAGOGASTRODUODENOSCOPY (EGD) WITH PROPOFOL;  Surgeon: Gatha Mayer, MD;  Location: [facility];  Service: Endoscopy;  Laterality: N/A;  . ESOPHAGOGASTRODUODENOSCOPY (EGD) WITH PROPOFOL N/A 04/29/2019   Procedure: ESOPHAGOGASTRODUODENOSCOPY (EGD) WITH PROPOFOL;  Surgeon: Mauri Pole, MD;  Location: [facility];  Service: Endoscopy;  Laterality: N/A;  . HEMOSTASIS CLIP PLACEMENT  04/29/2019   Procedure: HEMOSTASIS CLIP PLACEMENT;  Surgeon: Mauri Pole, MD;  Location: [place] [facility];  Service: Endoscopy;;  Clip placed as marker not for bleeding control  . IR ANGIOGRAM SELECTIVE EACH ADDITIONAL VESSEL  04/29/2019  . IR ANGIOGRAM SELECTIVE EACH ADDITIONAL VESSEL  04/29/2019  . IR ANGIOGRAM SELECTIVE EACH ADDITIONAL VESSEL  04/29/2019  . IR ANGIOGRAM VISCERAL SELECTIVE  04/29/2019  . IR EMBO ART  VEN HEMORR LYMPH EXTRAV  INC GUIDE ROADMAPPING  04/29/2019  . IR US GUIDE VASC ACCESS RIGHT  04/29/2019  . LUNG BIOPSY      REVIEW OF SYSTEMS:  A comprehensive review of systems was negative except for: Constitutional: positive for fatigue Respiratory: positive for dyspnea on exertion   PHYSICAL EXAMINATION: General appearance: alert, cooperative, fatigued and no distress Head:  Normocephalic, without obvious abnormality, atraumatic Neck: no adenopathy, no JVD, supple, symmetrical, trachea midline and thyroid not enlarged, symmetric, no tenderness/mass/nodules Lymph nodes: Cervical, supraclavicular, and axillary nodes normal. Resp: clear to auscultation bilaterally Back: symmetric, no curvature. ROM normal. No CVA tenderness. Cardio: regular rate and rhythm, S1, S2 normal, no murmur, click, rub or gallop GI: soft, non-tender; bowel sounds normal; no masses,  no organomegaly Extremities: extremities normal, atraumatic, no cyanosis or edema  ECOG PERFORMANCE STATUS: 1 - Symptomatic but completely ambulatory  Blood pressure (!) 145/74, pulse 79, temperature 98.1 F (36.7 C), temperature source Temporal, resp. rate (!) 22, height 5\' 9"  (1.753 m), weight 162 lb 4.8 oz (73.6 kg), SpO2 97 %.  LABORATORY DATA: Lab Results  Component Value Date   WBC 8.1 06/12/2020   HGB 9.8 (L) 06/12/2020   HCT 31.1 (L) 06/12/2020  MCV 99.7 06/12/2020   PLT 222 06/12/2020      Chemistry      Component Value Date/Time   NA 146 (H) 06/12/2020 0938   NA 143 10/04/2016 1354   K 4.0 06/12/2020 0938   K 3.9 10/04/2016 1354   CL 105 06/12/2020 0938   CO2 30 06/12/2020 0938   CO2 25 10/04/2016 1354   BUN 26 (H) 06/12/2020 0938   BUN 11.6 10/04/2016 1354   CREATININE 1.90 (H) 06/12/2020 0938   CREATININE 1.57 (H) 02/25/2020 1135   CREATININE 1.3 10/04/2016 1354      Component Value Date/Time   CALCIUM 9.4 06/12/2020 0938   CALCIUM 9.5 10/04/2016 1354   ALKPHOS 86 06/12/2020 0938   ALKPHOS 119 10/04/2016 1354   AST 9 (L) 06/12/2020 0938   AST 16 10/04/2016 1354   ALT <6 06/12/2020 0938   ALT 14 10/04/2016 1354   BILITOT 0.4 06/12/2020 0938   BILITOT 0.53 10/04/2016 1354       RADIOGRAPHIC STUDIES: CT Chest Wo Contrast  Result Date: 06/20/2020 CLINICAL DATA:  History of right lung cancer 2011, left lung cancer 2016,, disease recurrence 04/2019, s/p chemo XRT,  restaging EXAM: CT CHEST WITHOUT CONTRAST TECHNIQUE: Multidetector CT imaging of the chest was performed following the standard protocol without IV contrast. COMPARISON:  12/10/2019 FINDINGS: Cardiovascular: Aortic atherosclerosis. Unchanged enlargement of the descending thoracic aorta, measuring 3.0 x 3.0 cm. Cardiomegaly. Extensive Three-vessel coronary artery calcifications and/or stents. Enlargement of the main pulmonary artery, measuring up to 3.6 cm. No pericardial effusion. Mediastinum/Nodes: No enlarged mediastinal, hilar, or axillary lymph nodes. Thyroid gland, trachea, and esophagus demonstrate no significant findings. Lungs/Pleura: Minimal, predominantly paraseptal emphysema. Diffuse bilateral bronchial wall thickening. Interval increase in heterogeneous airspace opacity about a mass in the right lower lobe; the central component of the mass has slightly decreased in size, measuring 3.6 x 2.5 cm, previously 4.0 x 3.0 cm (series 7, image 106). Stable benign, partially calcified nodule of the posterior right upper lobe (series 7, image 38). Unchanged small, likely chronic and loculated right pleural effusion. Upper Abdomen: No acute abnormality. Gallstones in the gallbladder. Coil material in the vicinity of the gastroduodenal artery. Superior pole calculi of the right kidney. Musculoskeletal: No chest wall mass or suspicious bone lesions identified. Ankylosis of the thoracic spine. IMPRESSION: 1. Interval increase in heterogeneous airspace opacity about a mass in the right lower lobe; the central component of the mass has slightly decreased in size, measuring 3.6 x 2.5 cm, previously 4.0 x 3.0 cm. Findings are most consistent with developing radiation change. Attention on follow-up. 2. Unchanged small, likely chronic and loculated right pleural effusion. 3. No evidence of metastatic disease in the chest. 4. Emphysema (ICD10-J43.9). 5. Coronary artery disease. 6. Unchanged enlargement of the descending  thoracic aorta, measuring up to 3.0 x 3.0 cm. Aortic Atherosclerosis (ICD10-I70.0). 7. Enlargement of the main pulmonary artery, measuring up to 3.6 cm, as can be seen in pulmonary hypertension. 8. Cholelithiasis and right nephrolithiasis in the included upper abdomen. Electronically Signed   By: Eddie Candle M.D.   On: 06/20/2020 16:18    ASSESSMENT AND PLAN: This is a very pleasant 74 years old African-American male with stage IA non-small cell lung cancer, squamous cell carcinoma diagnosed in June 2016 status post curative radiotherapy with partial response.  The patient has been on observation since that time. He was lost to follow-up since 2017. The patient presented with new findings on recent CT scan of the chest  including annual right lower lobe lung mass as well as increasing size of the previous lesion in the right lower lobe. I ordered a PET scan which was performed recently and showed hypermetabolic activity in the large solid mass in the right lower lobe. The patient was seen by Dr. Tammi Klippel and he underwent SBRT to the hypermetabolic right lower lobe mass completed on 09/03/2019.  He tolerated this treatment well. He is currently on observation and feeling fine with no concerning complaints except for the baseline shortness of breath secondary to COPD. He had repeat CT scan of the chest performed recently.  I personally and independently reviewed the scans and discussed the results with the patient today. His scan showed no concerning findings for disease recurrence or progression. I recommended for him to continue on observation with repeat CT scan of the chest in 6 months. He was advised to call immediately if he has any concerning symptoms in the interval. The patient voices understanding of current disease status and treatment options and is in agreement with the current care plan.  All questions were answered. The patient knows to call the clinic with any problems, questions or  concerns. We can certainly see the patient much sooner if necessary.  Disclaimer: This note was dictated with voice recognition software. Similar sounding words can inadvertently be transcribed and may not be corrected upon review.

## 2020-06-29 ENCOUNTER — Telehealth: Payer: Self-pay | Admitting: Internal Medicine

## 2020-06-29 NOTE — Telephone Encounter (Signed)
Scheduled per los. Called and left msg. Mailed printout  °

## 2020-07-03 ENCOUNTER — Other Ambulatory Visit: Payer: Self-pay | Admitting: Neurology

## 2020-07-06 ENCOUNTER — Other Ambulatory Visit: Payer: Medicare Other | Admitting: Hospice

## 2020-07-20 ENCOUNTER — Other Ambulatory Visit: Payer: Medicare Other | Admitting: Hospice

## 2020-07-20 ENCOUNTER — Other Ambulatory Visit: Payer: Self-pay

## 2020-07-20 ENCOUNTER — Other Ambulatory Visit: Payer: Self-pay | Admitting: Hospice

## 2020-07-20 DIAGNOSIS — Z515 Encounter for palliative care: Secondary | ICD-10-CM

## 2020-07-20 DIAGNOSIS — C3431 Malignant neoplasm of lower lobe, right bronchus or lung: Secondary | ICD-10-CM

## 2020-07-20 NOTE — Progress Notes (Signed)
[facility] Consult Note Telephone: [phone]  Fax: [phone]  PATIENT NAME: John [name] DOB: 06-13-1946 MRN: [id number]  PRIMARY CARE PROVIDER:   [name], NP  REFERRING PROVIDER: [name], NP  RESPONSIBLE PARTY:[id number] [phone] Emergency contact, Saqib Cazarez - daughter- [phone]    RECOMMENDATIONS/PLAN:   Advance Care Planning/Goals of Care:Visit consisted of building trustand follow up on palliative care. Patient remains a DO NOT RESUSCITATE.MOST selections include limited additional intervention, IV fliud as indicated, antibiotics as indicated.Goals include to maximize quality of life and symptom management.  Visit consisted of counseling and education dealing with the complex and emotionally intense issues of symptom management and palliative care in the setting of serious and potentially life-threatening illness.  NP spoke with Willette Cluster updating her on the visit. Palliative care team will continue to support patient, patient's family, and medical team.  Symptom management:Patient seen by oncologist 06/27/2020 for recurrent non-small cell lung cancer; seen for evaluation/repeat CT scan  for restaging of his disease.  Chart review indicates no concerning findings for disease recurrence or progression.  Plan is to repeat CT scan in 6 months. He completed SBRT to the large solid mass in the right lower lobe 09/03/2019. Patient in no acute distress; he denies pain/discomfort; no coughing, baseline shortness of breath on moderate exertion.  He denied chest pain cough or hemoptysis.  He said overall he is doing well, appetite is good. FLACC 0. There has been no fall, no hospitalization since last visit. He reports no changes to his medication.  Mild edema to lower bilateral extremities, Willette Cluster said this is chronic.  Encouraged elevation.  Patient has appointment with PCP tomorrow, per  Willette Cluster. Chart review shows patient seen by Neurologist February 2021 for memory loss, resulting in diagnosis of mild dementia; patient no longer able to take care of his bills and finances. On Seroquel for mood/behavior.He continues to be independent in ADLs. FAST 4. Patient with no concerns at this time.   Encouraged ongoing care at home, and ongoing compliance to medications and plan of care.   Follow up Palliative Care Visit: Palliative care will continue to follow for goals of care clarification and symptom management.  I spent34minutes providing this consultation; time includes time with patient/family, chart review and documentation.More than 50% of the time in this consultation was spent on coordinating advance care communication.  HISTORY OF PRESENT ILLNESS:John E Mooreis a 74 y.o.year oldmalewith multiple medical problems including Right lower lober lung CA, Diastolic CHF, HTN, CKD 3,Mild dementia,DMPalliative Care was asked to help address goals of care  CODE STATUS:DNR  PPS:60% HOSPICE ELIGIBILITY/DIAGNOSIS: TBD  PAST MEDICAL HISTORY:  Past Medical History:  Diagnosis Date  . B12 deficiency   . CHF (congestive heart failure) ([facility])   . COPD (chronic obstructive pulmonary disease) ([facility])   . Diabetes mellitus without complication ([facility])   . Folate deficiency   . Gout   . Hyperlipemia   . Hypertension   . 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 MI (myocardial infarction) ([facility])   . Pulmonary hypertension ([facility])     SOCIAL HX:  Social History   Tobacco Use  . Smoking status: Former Smoker    Packs/day: 1.50    Years: 60.00    Pack years: 90.00    Types: Cigarettes    Quit date: 09/29/2012    Years since quitting: 7.8  . Smokeless tobacco: Never Used  Substance  Use Topics  . Alcohol use: No    Alcohol/week: 0.0 standard drinks    ALLERGIES:  Allergies  Allergen Reactions  . Levaquin [Levofloxacin In  D5w] Other (See Comments)    Unknown reaction  . Lisinopril Swelling    Facial swelling     PERTINENT MEDICATIONS:  Outpatient Encounter Medications as of 07/20/2020  Medication Sig  . acetaminophen (TYLENOL) 325 MG tablet Take 2 tablets (650 mg total) by mouth every 6 (six) hours as needed for mild pain (or Fever >/= 101).  Marland Kitchen albuterol (PROAIR HFA) 108 (90 BASE) MCG/ACT inhaler INHALE 2 PUFFS BY MOUTH EVERY 4 HOURS AS NEEDED FOR WHEEZING (Patient taking differently: Inhale 2 puffs into the lungs every 4 (four) hours as needed for wheezing. )  . allopurinol (ZYLOPRIM) 300 MG tablet Take 300 mg by mouth daily.  Marland Kitchen amLODipine (NORVASC) 2.5 MG tablet Take 1 tablet (2.5 mg total) by mouth daily.  Marland Kitchen atorvastatin (LIPITOR) 20 MG tablet Take 1 tablet (20 mg total) by mouth daily.  . bisoprolol (ZEBETA) 5 MG tablet TAKE ONE TABLET BY MOUTH DAILY  . budesonide-formoterol (SYMBICORT) 160-4.5 MCG/ACT inhaler Inhale 2 puffs into the lungs 2 (two) times daily.  . Cholecalciferol (VITAMIN D3) 50 MCG (2000 UT) capsule Take 2,000 Units by mouth daily.   Marland Kitchen donepezil (ARICEPT) 10 MG tablet Take 1/2 tablet daily for 2 weeks, then increase to 1 tablet daily  . ferrous sulfate 325 (65 FE) MG tablet Take 1 tablet (325 mg total) by mouth every Monday, Wednesday, and Friday.  . Magnesium Oxide 200 MG TABS Take 1 tablet (200 mg total) by mouth 2 (two) times daily.  . megestrol (MEGACE) 400 MG/10ML suspension Take 10 mLs (400 mg total) by mouth daily.  . Multiple Vitamin (MULTIVITAMIN WITH MINERALS) TABS tablet Take 1 tablet by mouth daily. Over the counter  . pantoprazole (PROTONIX) 40 MG tablet Take 1 tablet (40 mg total) by mouth 2 (two) times daily before a meal.  . potassium chloride SA (K-DUR) 20 MEQ tablet Take 1 tablet (20 mEq total) by mouth 2 (two) times daily.  . QUEtiapine (SEROQUEL) 25 MG tablet Take 1 tablet (25 mg total) by mouth 2 (two) times daily.  Marland Kitchen senna-docusate (SENOKOT-S) 8.6-50 MG tablet Take 1  tablet by mouth at bedtime.  . sucralfate (CARAFATE) 1 g tablet Take 1 g by mouth 4 (four) times daily.   . traMADol (ULTRAM) 50 MG tablet Take 1 tablet (50 mg total) by mouth every 6 (six) hours as needed for moderate pain.  . vitamin B-12 1000 MCG tablet Take 1 tablet (1,000 mcg total) by mouth daily.  . Vitamin D, Ergocalciferol, (DRISDOL) 1.25 MG (50000 UT) CAPS capsule Take 50,000 Units by mouth every Friday.    No facility-administered encounter medications on file as of 07/20/2020.    PHYSICAL EXAM/ROS:  General: NAD, pleasant cooperative Cardiovascular: regular rate and rhythm; denies chest pain Pulmonary: clear ant/post fields; no coughing, no shortness of breath Abdomen: soft, nontender, + bowel sounds GU: no suprapubic tenderness Extremities: Mild nonpitting edema to bilateral ankles, no joint deformities Skin: no rashes to exposed skin Neurological: Weakness but otherwise nonfocal  Teodoro Spray, NP

## 2020-07-30 DIAGNOSIS — R55 Syncope and collapse: Secondary | ICD-10-CM

## 2020-07-30 HISTORY — DX: Syncope and collapse: R55

## 2020-08-06 ENCOUNTER — Inpatient Hospital Stay (HOSPITAL_COMMUNITY)
Admission: EM | Admit: 2020-08-06 | Discharge: 2020-08-12 | DRG: 309 | Disposition: A | Discharge status: Skilled Nursing Facility | Payer: Medicare Other | Attending: Internal Medicine | Admitting: Internal Medicine

## 2020-08-06 ENCOUNTER — Other Ambulatory Visit: Payer: Self-pay

## 2020-08-06 ENCOUNTER — Emergency Department (HOSPITAL_COMMUNITY): Payer: Medicare Other

## 2020-08-06 ENCOUNTER — Emergency Department (HOSPITAL_BASED_OUTPATIENT_CLINIC_OR_DEPARTMENT_OTHER): Payer: Medicare Other

## 2020-08-06 ENCOUNTER — Encounter (HOSPITAL_COMMUNITY): Payer: Self-pay

## 2020-08-06 DIAGNOSIS — I130 Hypertensive heart and chronic kidney disease with heart failure and stage 1 through stage 4 chronic kidney disease, or unspecified chronic kidney disease: Secondary | ICD-10-CM | POA: Diagnosis present

## 2020-08-06 DIAGNOSIS — Z9981 Dependence on supplemental oxygen: Secondary | ICD-10-CM | POA: Diagnosis not present

## 2020-08-06 DIAGNOSIS — N179 Acute kidney failure, unspecified: Secondary | ICD-10-CM | POA: Diagnosis present

## 2020-08-06 DIAGNOSIS — S80211A Abrasion, right knee, initial encounter: Secondary | ICD-10-CM | POA: Diagnosis present

## 2020-08-06 DIAGNOSIS — I252 Old myocardial infarction: Secondary | ICD-10-CM | POA: Diagnosis not present

## 2020-08-06 DIAGNOSIS — N183 Chronic kidney disease, stage 3 unspecified: Secondary | ICD-10-CM | POA: Diagnosis present

## 2020-08-06 DIAGNOSIS — E1122 Type 2 diabetes mellitus with diabetic chronic kidney disease: Secondary | ICD-10-CM | POA: Diagnosis present

## 2020-08-06 DIAGNOSIS — E785 Hyperlipidemia, unspecified: Secondary | ICD-10-CM | POA: Diagnosis present

## 2020-08-06 DIAGNOSIS — Z66 Do not resuscitate: Secondary | ICD-10-CM | POA: Diagnosis present

## 2020-08-06 DIAGNOSIS — Z79899 Other long term (current) drug therapy: Secondary | ICD-10-CM

## 2020-08-06 DIAGNOSIS — Y92017 Garden or yard in single-family (private) house as the place of occurrence of the external cause: Secondary | ICD-10-CM

## 2020-08-06 DIAGNOSIS — I442 Atrioventricular block, complete: Secondary | ICD-10-CM | POA: Diagnosis not present

## 2020-08-06 DIAGNOSIS — J449 Chronic obstructive pulmonary disease, unspecified: Secondary | ICD-10-CM | POA: Diagnosis present

## 2020-08-06 DIAGNOSIS — Z87891 Personal history of nicotine dependence: Secondary | ICD-10-CM

## 2020-08-06 DIAGNOSIS — E1151 Type 2 diabetes mellitus with diabetic peripheral angiopathy without gangrene: Secondary | ICD-10-CM | POA: Diagnosis present

## 2020-08-06 DIAGNOSIS — M7989 Other specified soft tissue disorders: Secondary | ICD-10-CM

## 2020-08-06 DIAGNOSIS — Z8249 Family history of ischemic heart disease and other diseases of the circulatory system: Secondary | ICD-10-CM

## 2020-08-06 DIAGNOSIS — W1839XA Other fall on same level, initial encounter: Secondary | ICD-10-CM | POA: Diagnosis present

## 2020-08-06 DIAGNOSIS — I272 Pulmonary hypertension, unspecified: Secondary | ICD-10-CM | POA: Diagnosis present

## 2020-08-06 DIAGNOSIS — Z881 Allergy status to other antibiotic agents status: Secondary | ICD-10-CM

## 2020-08-06 DIAGNOSIS — D630 Anemia in neoplastic disease: Secondary | ICD-10-CM | POA: Diagnosis present

## 2020-08-06 DIAGNOSIS — F0150 Vascular dementia without behavioral disturbance: Secondary | ICD-10-CM | POA: Diagnosis present

## 2020-08-06 DIAGNOSIS — I5032 Chronic diastolic (congestive) heart failure: Secondary | ICD-10-CM

## 2020-08-06 DIAGNOSIS — J9611 Chronic respiratory failure with hypoxia: Secondary | ICD-10-CM | POA: Diagnosis present

## 2020-08-06 DIAGNOSIS — I472 Ventricular tachycardia: Principal | ICD-10-CM | POA: Diagnosis present

## 2020-08-06 DIAGNOSIS — Z20822 Contact with and (suspected) exposure to covid-19: Secondary | ICD-10-CM | POA: Diagnosis present

## 2020-08-06 DIAGNOSIS — R55 Syncope and collapse: Secondary | ICD-10-CM | POA: Diagnosis present

## 2020-08-06 DIAGNOSIS — I495 Sick sinus syndrome: Secondary | ICD-10-CM

## 2020-08-06 DIAGNOSIS — N1832 Chronic kidney disease, stage 3b: Secondary | ICD-10-CM | POA: Diagnosis not present

## 2020-08-06 DIAGNOSIS — C3431 Malignant neoplasm of lower lobe, right bronchus or lung: Secondary | ICD-10-CM | POA: Diagnosis present

## 2020-08-06 DIAGNOSIS — R451 Restlessness and agitation: Secondary | ICD-10-CM | POA: Diagnosis not present

## 2020-08-06 DIAGNOSIS — I471 Supraventricular tachycardia: Secondary | ICD-10-CM | POA: Diagnosis present

## 2020-08-06 DIAGNOSIS — F0151 Vascular dementia with behavioral disturbance: Secondary | ICD-10-CM | POA: Diagnosis present

## 2020-08-06 DIAGNOSIS — Y93H2 Activity, gardening and landscaping: Secondary | ICD-10-CM | POA: Diagnosis not present

## 2020-08-06 DIAGNOSIS — R32 Unspecified urinary incontinence: Secondary | ICD-10-CM | POA: Diagnosis present

## 2020-08-06 DIAGNOSIS — E119 Type 2 diabetes mellitus without complications: Secondary | ICD-10-CM

## 2020-08-06 DIAGNOSIS — I10 Essential (primary) hypertension: Secondary | ICD-10-CM | POA: Diagnosis present

## 2020-08-06 DIAGNOSIS — Z888 Allergy status to other drugs, medicaments and biological substances status: Secondary | ICD-10-CM

## 2020-08-06 DIAGNOSIS — S80212A Abrasion, left knee, initial encounter: Secondary | ICD-10-CM | POA: Diagnosis present

## 2020-08-06 DIAGNOSIS — M109 Gout, unspecified: Secondary | ICD-10-CM | POA: Diagnosis present

## 2020-08-06 DIAGNOSIS — I5031 Acute diastolic (congestive) heart failure: Secondary | ICD-10-CM | POA: Diagnosis not present

## 2020-08-06 DIAGNOSIS — Z7984 Long term (current) use of oral hypoglycemic drugs: Secondary | ICD-10-CM | POA: Diagnosis not present

## 2020-08-06 DIAGNOSIS — E538 Deficiency of other specified B group vitamins: Secondary | ICD-10-CM | POA: Diagnosis present

## 2020-08-06 LAB — CBC WITH DIFFERENTIAL/PLATELET
Abs Immature Granulocytes: 0.03 10*3/uL (ref 0.00–0.07)
Basophils Absolute: 0 10*3/uL (ref 0.0–0.1)
Basophils Relative: 0 %
Eosinophils Absolute: 0.2 10*3/uL (ref 0.0–0.5)
Eosinophils Relative: 3 %
HCT: 28 % — ABNORMAL LOW (ref 39.0–52.0)
Hemoglobin: 8.4 g/dL — ABNORMAL LOW (ref 13.0–17.0)
Immature Granulocytes: 0 %
Lymphocytes Relative: 3 %
Lymphs Abs: 0.2 10*3/uL — ABNORMAL LOW (ref 0.7–4.0)
MCH: 29.8 pg (ref 26.0–34.0)
MCHC: 30 g/dL (ref 30.0–36.0)
MCV: 99.3 fL (ref 80.0–100.0)
Monocytes Absolute: 0.7 10*3/uL (ref 0.1–1.0)
Monocytes Relative: 8 %
Neutro Abs: 7.2 10*3/uL (ref 1.7–7.7)
Neutrophils Relative %: 86 %
Platelets: 284 10*3/uL (ref 150–400)
RBC: 2.82 MIL/uL — ABNORMAL LOW (ref 4.22–5.81)
RDW: 18.5 % — ABNORMAL HIGH (ref 11.5–15.5)
WBC: 8.4 10*3/uL (ref 4.0–10.5)
nRBC: 0 % (ref 0.0–0.2)

## 2020-08-06 LAB — URINALYSIS, ROUTINE W REFLEX MICROSCOPIC
Bilirubin Urine: NEGATIVE
Glucose, UA: NEGATIVE mg/dL
Hgb urine dipstick: NEGATIVE
Ketones, ur: NEGATIVE mg/dL
Leukocytes,Ua: NEGATIVE
Nitrite: NEGATIVE
Protein, ur: NEGATIVE mg/dL
Specific Gravity, Urine: 1.012 (ref 1.005–1.030)
pH: 7 (ref 5.0–8.0)

## 2020-08-06 LAB — COMPREHENSIVE METABOLIC PANEL
ALT: 18 U/L (ref 0–44)
AST: 50 U/L — ABNORMAL HIGH (ref 15–41)
Albumin: 2.5 g/dL — ABNORMAL LOW (ref 3.5–5.0)
Alkaline Phosphatase: 80 U/L (ref 38–126)
Anion gap: 14 (ref 5–15)
BUN: 20 mg/dL (ref 8–23)
CO2: 27 mmol/L (ref 22–32)
Calcium: 8.7 mg/dL — ABNORMAL LOW (ref 8.9–10.3)
Chloride: 97 mmol/L — ABNORMAL LOW (ref 98–111)
Creatinine, Ser: 1.38 mg/dL — ABNORMAL HIGH (ref 0.61–1.24)
GFR calc Af Amer: 58 mL/min — ABNORMAL LOW (ref >60)
GFR calc non Af Amer: 50 mL/min — ABNORMAL LOW (ref >60)
Glucose, Bld: 126 mg/dL — ABNORMAL HIGH (ref 70–99)
Potassium: 5.1 mmol/L (ref 3.5–5.1)
Sodium: 138 mmol/L (ref 135–145)
Total Bilirubin: 0.7 mg/dL (ref 0.3–1.2)
Total Protein: 5.7 g/dL — ABNORMAL LOW (ref 6.5–8.1)

## 2020-08-06 LAB — I-STAT CHEM 8, ED
BUN: 21 mg/dL (ref 8–23)
Calcium, Ion: 1.25 mmol/L (ref 1.15–1.40)
Chloride: 97 mmol/L — ABNORMAL LOW (ref 98–111)
Creatinine, Ser: 1.4 mg/dL — ABNORMAL HIGH (ref 0.61–1.24)
Glucose, Bld: 123 mg/dL — ABNORMAL HIGH (ref 70–99)
HCT: 32 % — ABNORMAL LOW (ref 39.0–52.0)
Hemoglobin: 10.9 g/dL — ABNORMAL LOW (ref 13.0–17.0)
Potassium: 4.8 mmol/L (ref 3.5–5.1)
Sodium: 138 mmol/L (ref 135–145)
TCO2: 31 mmol/L (ref 22–32)

## 2020-08-06 LAB — HEMOGLOBIN A1C
Hgb A1c MFr Bld: 4.9 % (ref 4.8–5.6)
Mean Plasma Glucose: 93.93 mg/dL

## 2020-08-06 LAB — MAGNESIUM: Magnesium: 1.6 mg/dL — ABNORMAL LOW (ref 1.7–2.4)

## 2020-08-06 LAB — CBG MONITORING, ED: Glucose-Capillary: 97 mg/dL (ref 70–99)

## 2020-08-06 LAB — ETHANOL: Alcohol, Ethyl (B): <10 mg/dL (ref <10)

## 2020-08-06 LAB — PROCALCITONIN: Procalcitonin: <0.1 ng/mL

## 2020-08-06 LAB — TROPONIN I (HIGH SENSITIVITY)
Troponin I (High Sensitivity): 6 ng/L (ref <18)
Troponin I (High Sensitivity): 9 ng/L (ref <18)

## 2020-08-06 LAB — SARS CORONAVIRUS 2 BY RT PCR (HOSPITAL ORDER, PERFORMED IN ~~LOC~~ HOSPITAL LAB): SARS Coronavirus 2: NEGATIVE

## 2020-08-06 LAB — BRAIN NATRIURETIC PEPTIDE: B Natriuretic Peptide: 209.9 pg/mL — ABNORMAL HIGH (ref 0.0–100.0)

## 2020-08-06 MED ORDER — ENOXAPARIN SODIUM 40 MG/0.4ML ~~LOC~~ SOLN
40.0000 mg | SUBCUTANEOUS | Status: DC
  Administered 2020-08-06 – 2020-08-09 (×4): 40 mg via SUBCUTANEOUS
  Filled 2020-08-06 (×4): qty 0.4

## 2020-08-06 MED ORDER — VITAMIN B-12 1000 MCG PO TABS
1000.0000 ug | ORAL_TABLET | Freq: Every day | ORAL | Status: DC
  Administered 2020-08-07 – 2020-08-12 (×6): 1000 ug via ORAL
  Filled 2020-08-06 (×6): qty 1

## 2020-08-06 MED ORDER — ATORVASTATIN CALCIUM 10 MG PO TABS
20.0000 mg | ORAL_TABLET | Freq: Every day | ORAL | Status: DC
  Administered 2020-08-07 – 2020-08-12 (×6): 20 mg via ORAL
  Filled 2020-08-06 (×6): qty 2

## 2020-08-06 MED ORDER — SODIUM CHLORIDE 0.9% FLUSH: 3.0000 mL | INTRAVENOUS | Status: DC | PRN

## 2020-08-06 MED ORDER — AMLODIPINE BESYLATE 2.5 MG PO TABS
2.5000 mg | ORAL_TABLET | Freq: Every day | ORAL | Status: DC
  Administered 2020-08-07 – 2020-08-12 (×6): 2.5 mg via ORAL
  Filled 2020-08-06 (×6): qty 1

## 2020-08-06 MED ORDER — ADULT MULTIVITAMIN W/MINERALS CH
1.0000 | ORAL_TABLET | Freq: Every day | ORAL | Status: DC
  Administered 2020-08-07 – 2020-08-12 (×6): 1 via ORAL
  Filled 2020-08-06 (×6): qty 1

## 2020-08-06 MED ORDER — MOMETASONE FURO-FORMOTEROL FUM 200-5 MCG/ACT IN AERO
2.0000 | INHALATION_SPRAY | Freq: Two times a day (BID) | RESPIRATORY_TRACT | Status: DC
  Administered 2020-08-07 – 2020-08-12 (×10): 2 via RESPIRATORY_TRACT
  Filled 2020-08-06 (×2): qty 8.8

## 2020-08-06 MED ORDER — MAGNESIUM SULFATE 2 GM/50ML IV SOLN
2.0000 g | Freq: Once | INTRAVENOUS | Status: AC
  Administered 2020-08-06: 2 g via INTRAVENOUS
  Filled 2020-08-06: qty 50

## 2020-08-06 MED ORDER — DONEPEZIL HCL 5 MG PO TABS
5.0000 mg | ORAL_TABLET | Freq: Every day | ORAL | Status: DC
  Administered 2020-08-06 – 2020-08-11 (×6): 5 mg via ORAL
  Filled 2020-08-06 (×7): qty 1

## 2020-08-06 MED ORDER — FERROUS SULFATE 325 (65 FE) MG PO TABS
325.0000 mg | ORAL_TABLET | ORAL | Status: DC
  Administered 2020-08-07 – 2020-08-11 (×4): 325 mg via ORAL
  Filled 2020-08-06 (×6): qty 1

## 2020-08-06 MED ORDER — ALLOPURINOL 300 MG PO TABS
300.0000 mg | ORAL_TABLET | Freq: Every day | ORAL | Status: DC
  Administered 2020-08-07 – 2020-08-12 (×6): 300 mg via ORAL
  Filled 2020-08-06 (×6): qty 1

## 2020-08-06 MED ORDER — MAGNESIUM OXIDE 400 (241.3 MG) MG PO TABS
400.0000 mg | ORAL_TABLET | Freq: Two times a day (BID) | ORAL | Status: DC
  Administered 2020-08-06 – 2020-08-12 (×12): 400 mg via ORAL
  Filled 2020-08-06 (×12): qty 1

## 2020-08-06 MED ORDER — QUETIAPINE FUMARATE 25 MG PO TABS
25.0000 mg | ORAL_TABLET | Freq: Two times a day (BID) | ORAL | Status: DC
  Administered 2020-08-06 – 2020-08-12 (×12): 25 mg via ORAL
  Filled 2020-08-06 (×12): qty 1

## 2020-08-06 MED ORDER — PANTOPRAZOLE SODIUM 40 MG PO TBEC
40.0000 mg | DELAYED_RELEASE_TABLET | Freq: Two times a day (BID) | ORAL | Status: DC
  Administered 2020-08-07 – 2020-08-08 (×4): 40 mg via ORAL
  Filled 2020-08-06 (×4): qty 1

## 2020-08-06 MED ORDER — BISOPROLOL FUMARATE 5 MG PO TABS
5.0000 mg | ORAL_TABLET | Freq: Every day | ORAL | Status: DC
  Administered 2020-08-07: 5 mg via ORAL
  Filled 2020-08-06: qty 1

## 2020-08-06 MED ORDER — SODIUM CHLORIDE 0.9 % IV SOLN
1.0000 g | Freq: Once | INTRAVENOUS | Status: AC
  Administered 2020-08-06: 1 g via INTRAVENOUS
  Filled 2020-08-06: qty 10

## 2020-08-06 MED ORDER — SODIUM CHLORIDE 0.9 % IV SOLN
500.0000 mg | Freq: Once | INTRAVENOUS | Status: AC
  Administered 2020-08-06: 500 mg via INTRAVENOUS
  Filled 2020-08-06: qty 500

## 2020-08-06 MED ORDER — SODIUM CHLORIDE 0.9% FLUSH
3.0000 mL | Freq: Two times a day (BID) | INTRAVENOUS | Status: DC
  Administered 2020-08-06 – 2020-08-12 (×10): 3 mL via INTRAVENOUS

## 2020-08-06 MED ORDER — VITAMIN D 25 MCG (1000 UNIT) PO TABS
2000.0000 [IU] | ORAL_TABLET | Freq: Every day | ORAL | Status: DC
  Administered 2020-08-07 – 2020-08-12 (×6): 2000 [IU] via ORAL
  Filled 2020-08-06 (×6): qty 2

## 2020-08-06 MED ORDER — MEGESTROL ACETATE 400 MG/10ML PO SUSP
400.0000 mg | Freq: Every day | ORAL | Status: DC
  Administered 2020-08-07 – 2020-08-12 (×6): 400 mg via ORAL
  Filled 2020-08-06 (×6): qty 10

## 2020-08-06 MED ORDER — SENNOSIDES-DOCUSATE SODIUM 8.6-50 MG PO TABS
1.0000 | ORAL_TABLET | Freq: Every day | ORAL | Status: DC
  Administered 2020-08-06 – 2020-08-11 (×6): 1 via ORAL
  Filled 2020-08-06 (×7): qty 1

## 2020-08-06 MED ORDER — AMOXICILLIN 500 MG PO CAPS
500.0000 mg | ORAL_CAPSULE | Freq: Three times a day (TID) | ORAL | 0 refills | Status: DC
Start: 2020-08-06 — End: 2020-08-12

## 2020-08-06 MED ORDER — SUCRALFATE 1 G PO TABS
1.0000 g | ORAL_TABLET | Freq: Four times a day (QID) | ORAL | Status: DC
  Administered 2020-08-06 – 2020-08-12 (×20): 1 g via ORAL
  Filled 2020-08-06 (×20): qty 1

## 2020-08-06 MED ORDER — ACETAMINOPHEN 325 MG PO TABS
650.0000 mg | ORAL_TABLET | Freq: Four times a day (QID) | ORAL | Status: DC | PRN
  Administered 2020-08-07 – 2020-08-11 (×3): 650 mg via ORAL
  Filled 2020-08-06 (×4): qty 2

## 2020-08-06 MED ORDER — TRAMADOL HCL 50 MG PO TABS
50.0000 mg | ORAL_TABLET | Freq: Four times a day (QID) | ORAL | Status: DC | PRN
  Administered 2020-08-08: 50 mg via ORAL
  Filled 2020-08-06: qty 1

## 2020-08-06 MED ORDER — AZITHROMYCIN 250 MG PO TABS
250.0000 mg | ORAL_TABLET | Freq: Every day | ORAL | 0 refills | Status: DC
Start: 2020-08-06 — End: 2020-08-12

## 2020-08-06 MED ORDER — SODIUM CHLORIDE 0.9 % IV SOLN: 250.0000 mL | INTRAVENOUS | Status: DC | PRN

## 2020-08-06 MED ORDER — ALBUTEROL SULFATE (2.5 MG/3ML) 0.083% IN NEBU: 2.5000 mg | INHALATION_SOLUTION | RESPIRATORY_TRACT | Status: DC | PRN

## 2020-08-06 MED ORDER — POTASSIUM CHLORIDE CRYS ER 20 MEQ PO TBCR
20.0000 meq | EXTENDED_RELEASE_TABLET | Freq: Two times a day (BID) | ORAL | Status: DC
  Administered 2020-08-07 – 2020-08-12 (×11): 20 meq via ORAL
  Filled 2020-08-06 (×11): qty 1

## 2020-08-06 MED ORDER — IOHEXOL 350 MG/ML SOLN
75.0000 mL | Freq: Once | INTRAVENOUS | Status: AC | PRN
  Administered 2020-08-06: 75 mL via INTRAVENOUS

## 2020-08-06 NOTE — H&P (Signed)
History and Physical    John [name] GHW:[id number] DOB: July 09, 1946 DOA: 08/06/2020  PCP: [name], NP (Confirm with patient/family/NH records and if not entered, this has to be entered at [facility] point of entry) Patient coming from: home  I have personally briefly reviewed patient's old medical records in [place]  Chief Complaint: witnessed syncope w/o seizure activity  HPI: John [name] is a 74 y.o. male with medical history significant of HTN, HLD, COPD, CHF, and squamous cell carcinoma of the right lung presents to the ED with syncope while working in the yard today.  Per EMS the patient was working in the yard with his son which is not unusual.  The son apparently witnessed the patient collapsed to the ground and lose consciousness.  He felt mainly to his knees and there was no reported head injury or trauma.  Fire arrived on scene and reported that the patient was hypoxic to the 80s.  They started nonrebreather and when EMS arrived they were able to titrate to nasal cannula currently at 1 L.  No witnessed seizure activity.  The patient does not recall the event.  He does not recall feeling badly prior to this.  He states that overall his energy has been good, he has been eating well, compliant with his medications.  No recent medication changes.  Patient is currently followed by palliative care but not in the hospice program. Review of recent Oncology note mentions patient is on home O2 but not apparently wearing this today.  As patient was walking out of dept after being discharged had a witnessed syncopal episode with collapse with respiratory distress requiring brief assistant. He was noted to have non-sustained V-Tach. Due to two episodes of syncope he is referred to [facility] for tele admission for possible arrythmogenic syncope. Cardiology is to see patient in the ED.    Patient had previous admission for syncope and collapse 2020 with negative EEG and neuro consult -  seizure ruled out.   ED Course: afebrile, 143/68, HR 91, RR 22 O2 sat 95%. Lab results significant for glucose 123, Cr 1.38-at his baseline, BNP 209.9, Troponin #1 6, #2 9. Hgb 8.4 _0  hras, 10.9 @ 1716 hrs. CTA chest negative for PE, RLL mass enlarged. Due to outpatient syncope and witnessed syncope in the ED he is referred to [facility] for tele admission to monitor for arrythmia.  Review of Systems: As per HPI otherwise 10 point review of systems negative. Caveat - patient has very poor short term memory.   Past Medical History:  Diagnosis Date  . B12 deficiency   . CHF (congestive heart failure) ([place])   . COPD (chronic obstructive pulmonary disease) ([place])   . Diabetes mellitus without complication ([place])   . Folate deficiency   . Gout   . Hyperlipemia   . Hypertension   . 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 MI (myocardial infarction) ([place])   . Pulmonary hypertension ([place])     Past Surgical History:  Procedure Laterality Date  . APPENDECTOMY    . BIOPSY  04/21/2019   Procedure: BIOPSY;  Surgeon: Gatha Mayer, MD;  Location: [place];  Service: Endoscopy;;  . ESOPHAGOGASTRODUODENOSCOPY (EGD) WITH PROPOFOL N/A 04/21/2019   Procedure: ESOPHAGOGASTRODUODENOSCOPY (EGD) WITH PROPOFOL;  Surgeon: Gatha Mayer, MD;  Location: [place];  Service: Endoscopy;  Laterality: N/A;  . ESOPHAGOGASTRODUODENOSCOPY (EGD) WITH PROPOFOL N/A 04/29/2019   Procedure: ESOPHAGOGASTRODUODENOSCOPY (EGD) WITH  PROPOFOL;  Surgeon: Mauri Pole, MD;  Location: [facility] ENDOSCOPY;  Service: Endoscopy;  Laterality: N/A;  . HEMOSTASIS CLIP PLACEMENT  04/29/2019   Procedure: HEMOSTASIS CLIP PLACEMENT;  Surgeon: Mauri Pole, MD;  Location: [facility] ENDOSCOPY;  Service: Endoscopy;;  Clip placed as marker not for bleeding control  . IR ANGIOGRAM SELECTIVE EACH ADDITIONAL VESSEL  04/29/2019  . IR ANGIOGRAM SELECTIVE EACH ADDITIONAL VESSEL  04/29/2019  . IR  ANGIOGRAM SELECTIVE EACH ADDITIONAL VESSEL  04/29/2019  . IR ANGIOGRAM VISCERAL SELECTIVE  04/29/2019  . IR EMBO ART  VEN HEMORR LYMPH EXTRAV  INC GUIDE ROADMAPPING  04/29/2019  . IR US GUIDE VASC ACCESS RIGHT  04/29/2019  . LUNG BIOPSY     Soc Hx -  Widower. Has a son and a daughter, several grandchildren. He lives in his own home and his daughter lives with him. He is a retired Horticulturist, commercial.     reports that he quit smoking about 7 years ago. His smoking use included cigarettes. He has a 90.00 pack-year smoking history. He has never used smokeless tobacco. He reports that he does not drink alcohol and does not use drugs.  Allergies  Allergen Reactions  . Levaquin [Levofloxacin In D5w] Other (See Comments)    Unknown reaction  . Lisinopril Swelling    Facial swelling    Family History  Problem Relation Age of Onset  . Heart disease Mother   . Cancer Neg Hx      Prior to Admission medications   Medication Sig Start Date End Date Taking? Authorizing Provider  acetaminophen (TYLENOL) 325 MG tablet Take 2 tablets (650 mg total) by mouth every 6 (six) hours as needed for mild pain (or Fever >/= 101). 09/05/19   Eugenie Filler, MD  albuterol (PROAIR HFA) 108 (90 BASE) MCG/ACT inhaler INHALE 2 PUFFS BY MOUTH EVERY 4 HOURS AS NEEDED FOR WHEEZING Patient taking differently: Inhale 2 puffs into the lungs every 4 (four) hours as needed for wheezing.  03/13/15   Tanda Rockers, MD  allopurinol (ZYLOPRIM) 300 MG tablet Take 300 mg by mouth daily. 04/07/19   [provider]  amLODipine (NORVASC) 2.5 MG tablet Take 1 tablet (2.5 mg total) by mouth daily. 09/23/19   Nita Sells, MD  amoxicillin (AMOXIL) 500 MG capsule Take 1 capsule (500 mg total) by mouth 3 (three) times daily. 08/06/20   Virgel Manifold, MD  atorvastatin (LIPITOR) 20 MG tablet Take 1 tablet (20 mg total) by mouth daily. 09/26/16   Tanda Rockers, MD  azithromycin (ZITHROMAX) 250 MG tablet Take 1 tablet (250 mg total)  by mouth daily. Take first 2 tablets together, then 1 every day until finished. 08/06/20   Virgel Manifold, MD  bisoprolol (ZEBETA) 5 MG tablet TAKE ONE TABLET BY MOUTH DAILY 07/04/20   Cameron Sprang, MD  budesonide-formoterol [facility]) 160-4.5 MCG/ACT inhaler Inhale 2 puffs into the lungs 2 (two) times daily.    [provider]  Cholecalciferol (VITAMIN D3) 50 MCG (2000 UT) capsule Take 2,000 Units by mouth daily.  05/07/19   [provider]  donepezil (ARICEPT) 10 MG tablet Take 1/2 tablet daily for 2 weeks, then increase to 1 tablet daily 02/25/20   Cameron Sprang, MD  ferrous sulfate 325 (65 FE) MG tablet Take 1 tablet (325 mg total) by mouth every Monday, Wednesday, and Friday. 08/11/19 03/08/20  Rai, Ripudeep Raliegh Ip, MD  Magnesium Oxide 200 MG TABS Take 1 tablet (200 mg total) by mouth 2 (  two) times daily. 09/17/19   Daleen Bo, MD  megestrol (MEGACE) 400 MG/10ML suspension Take 10 mLs (400 mg total) by mouth daily. 09/05/19   Eugenie Filler, MD  Multiple Vitamin (MULTIVITAMIN WITH MINERALS) TABS tablet Take 1 tablet by mouth daily. Over the counter 08/12/19   Rai, Vernelle Emerald, MD  pantoprazole (PROTONIX) 40 MG tablet Take 1 tablet (40 mg total) by mouth 2 (two) times daily before a meal. 05/28/19   Nandigam, Venia Minks, MD  potassium chloride SA (K-DUR) 20 MEQ tablet Take 1 tablet (20 mEq total) by mouth 2 (two) times daily. 09/17/19   Daleen Bo, MD  QUEtiapine (SEROQUEL) 25 MG tablet Take 1 tablet (25 mg total) by mouth 2 (two) times daily. 08/11/19   Rai, Ripudeep K, MD  senna-docusate (SENOKOT-S) 8.6-50 MG tablet Take 1 tablet by mouth at bedtime. 05/01/19   Florencia Reasons, MD  sucralfate (CARAFATE) 1 g tablet Take 1 g by mouth 4 (four) times daily.  06/23/19   [provider]  traMADol (ULTRAM) 50 MG tablet Take 1 tablet (50 mg total) by mouth every 6 (six) hours as needed for moderate pain. 09/17/19   Daleen Bo, MD  vitamin B-12 1000 MCG tablet Take 1 tablet (1,000 mcg  total) by mouth daily. 08/12/19   Rai, Vernelle Emerald, MD  Vitamin D, Ergocalciferol, (DRISDOL) 1.25 MG (50000 UT) CAPS capsule Take 50,000 Units by mouth every Friday.  03/10/19   [provider]    Physical Exam: Vitals:   08/06/20 1307 08/06/20 1400 08/06/20 1652 08/06/20 1800  BP:  (!) 128/59 (!) 143/68 (!) 171/78  Pulse:  75 91 81  Resp:  18 (!) 22 (!) 22  Temp:      TempSrc:      SpO2:  95% 95% 100%  Weight: 73.6 kg     Height: _0  (1.753 m)       Constitutional: NAD, calm, comfortable Vitals:   08/06/20 1307 08/06/20 1400 08/06/20 1652 08/06/20 1800  BP:  (!) 128/59 (!) 143/68 (!) 171/78  Pulse:  75 91 81  Resp:  18 (!) 22 (!) 22  Temp:      TempSrc:      SpO2:  95% 95% 100%  Weight: 73.6 kg     Height: _1  (1.753 m)      General -  Thin elderly gentleman in no distress. Eyes: PERRL, lids and conjunctivae normal ENMT: Mucous membranes are moist. .Poor dentition.  Neck: normal, supple, no masses, no thyromegaly Respiratory: mild increased WOB using abdominal muscles, no neck retractions. Decreased breath sounds throughout but no appreciable breath sounds right base. Scattered crackles, no wheezing.  Cardiovascular: Regular rate and rhythm, no murmurs / rubs / gallops. Left lower extremity with 2+ pitting edema to the knee, minimal Right LE edema.  1+ pedal pulses. No carotid bruits.  Abdomen: no tenderness, no masses palpated. Supraumbilical hernia easily reduced. No hepatosplenomegaly. Bowel sounds positive.  Musculoskeletal: no clubbing / cyanosis. No joint deformity upper and lower extremities. No tophi, no hot joints.  Good ROM, no contractures. Decreased  muscle tone.  Skin: no rashes, lesions, ulcers. No induration Neurologic: CN 2-12 grossly intact.  Strength 4/5 in all 4.  Psychiatric: Poor short term memory. Does not know why he is in the hospital. Awake and alert and able to answer questions, follows 1 step commands.    Labs on Admission: I have  personally reviewed following labs and imaging studies  CBC: Recent Labs  Lab 08/06/20 1311 08/06/20 1716  WBC 8.4  --   NEUTROABS 7.2  --   HGB 8.4* 10.9*  HCT 28.0* 32.0*  MCV 99.3  --   PLT 284  --    Basic Metabolic Panel: Recent Labs  Lab 08/06/20 1311 08/06/20 1528 08/06/20 1716  NA 138  --  138  K 5.1  --  4.8  CL 97*  --  97*  CO2 27  --   --   GLUCOSE 126*  --  123*  BUN 20  --  21  CREATININE 1.38*  --  1.40*  CALCIUM 8.7*  --   --   MG  --  1.6*  --    GFR: Estimated Creatinine Clearance: 45.6 mL/min (A) (by C-G formula based on SCr of 1.4 mg/dL (H)). Liver Function Tests: Recent Labs  Lab 08/06/20 1311  AST 50*  ALT 18  ALKPHOS 80  BILITOT 0.7  PROT 5.7*  ALBUMIN 2.5*   No results for input(s): LIPASE, AMYLASE in the last 168 hours. No results for input(s): AMMONIA in the last 168 hours. Coagulation Profile: No results for input(s): INR, PROTIME in the last 168 hours. Cardiac Enzymes: No results for input(s): CKTOTAL, CKMB, CKMBINDEX, TROPONINI in the last 168 hours. BNP (last 3 results) No results for input(s): PROBNP in the last 8760 hours. HbA1C: No results for input(s): HGBA1C in the last 72 hours. CBG: Recent Labs  Lab 08/06/20 1709  GLUCAP 97   Lipid Profile: No results for input(s): CHOL, HDL, LDLCALC, TRIG, CHOLHDL, LDLDIRECT in the last 72 hours. Thyroid Function Tests: No results for input(s): TSH, T4TOTAL, FREET4, T3FREE, THYROIDAB in the last 72 hours. Anemia Panel: No results for input(s): VITAMINB12, FOLATE, FERRITIN, TIBC, IRON, RETICCTPCT in the last 72 hours. Urine analysis:    Component Value Date/Time   COLORURINE YELLOW 08/06/2020 1448   APPEARANCEUR CLEAR 08/06/2020 1448   LABSPEC 1.012 08/06/2020 1448   PHURINE 7.0 08/06/2020 1448   GLUCOSEU NEGATIVE 08/06/2020 1448   HGBUR NEGATIVE 08/06/2020 1448   BILIRUBINUR NEGATIVE 08/06/2020 1448   KETONESUR NEGATIVE 08/06/2020 1448   PROTEINUR NEGATIVE 08/06/2020  1448   UROBILINOGEN 1.0 02/22/2012 0238   NITRITE NEGATIVE 08/06/2020 1448   LEUKOCYTESUR NEGATIVE 08/06/2020 1448    Radiological Exams on Admission: CT Head Wo Contrast  Result Date: 08/06/2020 CLINICAL DATA:  Mental status changes EXAM: CT HEAD WITHOUT CONTRAST TECHNIQUE: Contiguous axial images were obtained from the base of the skull through the vertex without intravenous contrast. COMPARISON:  08/01/2019 FINDINGS: Brain: There is atrophy and chronic small vessel disease changes. No acute intracranial abnormality. Specifically, no hemorrhage, hydrocephalus, mass lesion, acute infarction, or significant intracranial injury. Vascular: No hyperdense vessel or unexpected calcification. Skull: No acute calvarial abnormality. Sinuses/Orbits: No acute findings. Old left medial orbital wall blowout fracture. Other: None IMPRESSION: Atrophy, chronic microvascular disease. No acute intracranial abnormality. Electronically Signed   By: Rolm Baptise M.D.   On: 08/06/2020 15:09   CT Angio Chest PE W and/or Wo Contrast  Result Date: 08/06/2020 CLINICAL DATA:  Syncope EXAM: CT ANGIOGRAPHY CHEST WITH CONTRAST TECHNIQUE: Multidetector CT imaging of the chest was performed using the standard protocol during bolus administration of intravenous contrast. Multiplanar CT image reconstructions and MIPs were obtained to evaluate the vascular anatomy. CONTRAST:  32m OMNIPAQUE IOHEXOL 350 MG/ML SOLN COMPARISON:  06/20/2020 FINDINGS: Cardiovascular: Heart is normal size. Coronary artery and aortic atherosclerosis. No aneurysm. No filling defects in the pulmonary arteries to suggest pulmonary emboli.  Mediastinum/Nodes: No mediastinal, hilar, or axillary adenopathy. Trachea and thyroid unremarkable. Stomach is delete that esophagus is filled with food material/debris. Lungs/Pleura: Masslike airspace opacity in the right lower lobe. Overall, airspace disease has increased since prior study. Small right pleural effusion, also  increased since prior study. Left lung clear. Upper Abdomen: No acute findings. Musculoskeletal: Chest wall soft tissues are unremarkable. No acute bony abnormality. Advanced degenerative changes with large flowing anterior osteophytes in the thoracic spine. Review of the MIP images confirms the above findings. IMPRESSION: Increasing masslike airspace opacity in the right lower lobe. Previously, the central mass could be visualized, surrounded by airspace disease. Currently, the central mass canal longer be identified within the central airspace opacity. Increasing size of small right pleural effusion. Food material/debris within the esophagus could be related to dysmotility or reflux. Coronary artery disease. No evidence of pulmonary embolus. Aortic Atherosclerosis (ICD10-I70.0). Electronically Signed   By: Rolm Baptise M.D.   On: 08/06/2020 15:25   DG Chest Portable 1 View  Result Date: 08/06/2020 CLINICAL DATA:  Hypoxemia. Syncopal episode in his yard. History of RIGHT lung cancer 2011, LEFT lung cancer in 2016. Disease recurrence in May of 2020. EXAM: PORTABLE CHEST 1 VIEW COMPARISON:  08/09/2019 and CT chest on 06/20/2020 FINDINGS: The heart is enlarged and stable in configuration. Patchy airspace filling opacities in the RIGHT LOWER lobe are more opaque, suggesting infectious changes on pre-existing RIGHT LOWER lobe mass. LEFT lung is clear. No pulmonary edema. IMPRESSION: Increased airspace filling opacities in the RIGHT LOWER lobe, suggesting infectious infiltrate in addition to known RIGHT LOWER lobe mass. Electronically Signed   By: Nolon Nations M.D.   On: 08/06/2020 13:45   VAS [place] LOWER EXTREMITY VENOUS (DVT) (MC and WL 7a-7p)  Result Date: 08/06/2020  Lower Venous DVTStudy Indications: Swelling.  Comparison Study: No prior studies. Performing Technologist: Darlin Coco  Examination Guidelines: A complete evaluation includes B-mode imaging, spectral Doppler, color Doppler, and power Doppler as  needed of all accessible portions of each vessel. Bilateral testing is considered an integral part of a complete examination. Limited examinations for reoccurring indications may be performed as noted. The reflux portion of the exam is performed with the patient in reverse Trendelenburg.  +-----+---------------+---------+-----------+----------+--------------+ RIGHTCompressibilityPhasicitySpontaneityPropertiesThrombus Aging +-----+---------------+---------+-----------+----------+--------------+ CFV  Full           Yes      Yes                                 +-----+---------------+---------+-----------+----------+--------------+   +---------+---------------+---------+-----------+----------+--------------+ LEFT     CompressibilityPhasicitySpontaneityPropertiesThrombus Aging +---------+---------------+---------+-----------+----------+--------------+ CFV      Full           Yes      Yes                                 +---------+---------------+---------+-----------+----------+--------------+ SFJ      Full                                                        +---------+---------------+---------+-----------+----------+--------------+ FV Prox  Full                                                        +---------+---------------+---------+-----------+----------+--------------+  FV Mid   Full                                                        +---------+---------------+---------+-----------+----------+--------------+ FV DistalFull                                                        +---------+---------------+---------+-----------+----------+--------------+ PFV      Full                                                        +---------+---------------+---------+-----------+----------+--------------+ POP      Full           Yes      Yes                                 +---------+---------------+---------+-----------+----------+--------------+ PTV       Full                                                        +---------+---------------+---------+-----------+----------+--------------+ PERO     Full                                                        +---------+---------------+---------+-----------+----------+--------------+     Summary: RIGHT: - No evidence of common femoral vein obstruction.  LEFT: - There is no evidence of deep vein thrombosis in the lower extremity.  - No cystic structure found in the popliteal fossa.  *See table(s) above for measurements and observations. Electronically signed by Harold Barban MD on 08/06/2020 at 4:56:58 PM.    Final     EKG: Independently reviewed. # @ 1710 hr - SR with LAE, #2 @ 6415 - ectopic atrial tachycardia, ? Ischemic change anterolaterally, #3 1713 - Sinus TAchycardia PVC, aberrant conduction.   Assessment/Plan Active Problems:   Syncope and collapse   Chronic diastolic CHF (congestive heart failure) (HCC)   Diabetes mellitus ([place])   Primary cancer of right lower lobe of lung (HCC)   Vascular dementia without behavioral disturbance (HCC)   HTN (hypertension)   COPD II/III with reversibility    CKD (chronic kidney disease), stage III  (please populate well all problems here in Problem List. (For example, if patient is on BP meds at home and you resume or decide to hold them, it is a problem that needs to be her. Same for CAD, COPD, HLD and so on)   1. Syncope and collapse - patient has had multiple episodes and at least one admission for this problem. Neurologic etiology has been ruled out: negative EEG, neuro consult opinion. CT  brain without acute changes or mets. Suspect cardiogenic etiology. Plan Cardiac tele admission- to include monitoring with activity  continue current medications  Cardiology consult - pending  2. Chronic HFpEF - mild elevation of BNP. Does not appear decompensated. Last Echo Dec '20 with EF 60-65% and impaired relaxtion. Plan Continue home  medications  2 D echo  3. DM - on no medication. Serum glucose with mild elevation. Last A1C 4.6% 08/10/19 Plan A1C with recommendations to follow  4. Oncology - progressive NSCCA RLL with increased mass size on CTA today. Patient is loosing weight. He follows with Dr. Earlie Server and has been seen by [facility]. In chart he is DNR - discussed with daughter who vaguely remembers conversation.   5. COPD - By records was on home O2 at one time.patient with mild increased WOB. CXR suggests possible infiltrate but CTA does not reveal any infiltrate. He was given Ceftriaxone and Azithromycin in ED but is afebrile and no leukcytosis, no cough or sputum production, no hypoxemia. Plan  procalcitonin  No additional Abx unless further evidence of infection.  Continue home inhalers.   Oxygen at 2l [place]  6. Dementia - continue home medications  7. Disposition - has good support at home and should be able to return home with [facility] services to be continued.  DVT prophylaxis: lovenox  Code Status: DNR  Family Communication: Spoke at length with Daughter. Answered her questions to best of my ability.  Disposition Plan: Home when medically stable  Consults called: Cardiology - patient followed by [facility] (with names) Admission status: inpatient-tele    Adella Hare MD [facility] Pager [phone]  If 7PM-7AM, please contact night-coverage [email or web address] Password [facility]  08/06/2020, 7:32 PM

## 2020-08-06 NOTE — Consult Note (Signed)
CARDIOLOGY CONSULT NOTE  Patient ID: KIRSTEN MCKONE MRN: [id number] DOB/AGE: 76-Dec-1947 76 y.o.  Admit date: 08/06/2020 Primary Cardiologist None Chief Complaint  Syncope Requesting  ED  HPI:   LUPE BONNER is a 74 y.o. male with recurrent non-small cell lung cancer (most recent SBRT completed 09/03/2019), h/o HFpEF, HTN, HLD, COPD, and dementia who  presents to the ED with syncope. Cardiology is consulted for assistance.  History mostly obtained from available records, as patient is not able to recall pertinent details.Per EMS report, Mr. Vanosdol was working in the yard with his son when his son witnessed the patient collapse to the ground and momentarily lose consciousness; there was no witnessed seizure activity.EMS arrived on scene and found patient to be hypoxic to the 80s. He was subsequently started on a nonrebreather and by the time of arrival to the ED they were able to titrate to  1 L nasal cannula. In the ED, patient was afebrile, with BP 143/68, HR 91, RR 22 O2 sat 95%. Initial labs showed a normal K, Mg of 1.6, normal serum glucose, BNP of 209.9, Hgb 8.4 (9.8 two months ago). Troponin was wnl and EKG showed NSR. CT Angio chest was negative for PE, CT head showed NAICP, and CXR showed findings concerning for infectious infiltrate.   Plans were being made for patient to be discharged from the ED, however he suffered another syncopal event and fell to the floor after using the restroom. Unfortunately, he was not connected to telemetry during the event. Intial ECG a few minutes after showed a focal AT at 128 bpm. A second ECG showed sinus tach at 116 with multifocal PVCs.  Because of these 2 events, he is being admitted to the hospital for further observation.  Patient states that he is comfortable and denies any associated chest pain, exertional chest pressure/discomfort, fevers/chills, dyspnea/tachypnea, paroxysmal nocturnal dyspnea/orthopnea, irregular heart beat/palpitations,  presyncope/syncope, lower extremity edema, claudication, or abdominal distention.    Past Medical History:  Diagnosis Date  . B12 deficiency   . CHF (congestive heart failure) ([place])   . COPD (chronic obstructive pulmonary disease) ([place])   . Diabetes mellitus without complication ([place])   . Folate deficiency   . Gout   . Hyperlipemia   . Hypertension   . 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 MI (myocardial infarction) ([place])   . Pulmonary hypertension ([place])     Past Surgical History:  Procedure Laterality Date  . APPENDECTOMY    . BIOPSY  04/21/2019   Procedure: BIOPSY;  Surgeon: Gatha Mayer, MD;  Location: [facility];  Service: Endoscopy;;  . ESOPHAGOGASTRODUODENOSCOPY (EGD) WITH PROPOFOL N/A 04/21/2019   Procedure: ESOPHAGOGASTRODUODENOSCOPY (EGD) WITH PROPOFOL;  Surgeon: Gatha Mayer, MD;  Location: [facility];  Service: Endoscopy;  Laterality: N/A;  . ESOPHAGOGASTRODUODENOSCOPY (EGD) WITH PROPOFOL N/A 04/29/2019   Procedure: ESOPHAGOGASTRODUODENOSCOPY (EGD) WITH PROPOFOL;  Surgeon: Mauri Pole, MD;  Location: [facility];  Service: Endoscopy;  Laterality: N/A;  . HEMOSTASIS CLIP PLACEMENT  04/29/2019   Procedure: HEMOSTASIS CLIP PLACEMENT;  Surgeon: Mauri Pole, MD;  Location: [facility];  Service: Endoscopy;;  Clip placed as marker not for bleeding control  . IR ANGIOGRAM SELECTIVE EACH ADDITIONAL VESSEL  04/29/2019  . IR ANGIOGRAM SELECTIVE EACH ADDITIONAL VESSEL  04/29/2019  . IR ANGIOGRAM SELECTIVE EACH ADDITIONAL VESSEL  04/29/2019  . IR ANGIOGRAM VISCERAL SELECTIVE  04/29/2019  . IR EMBO ART  VEN  HEMORR LYMPH EXTRAV  INC GUIDE ROADMAPPING  04/29/2019  . IR US GUIDE VASC ACCESS RIGHT  04/29/2019  . LUNG BIOPSY      Allergies  Allergen Reactions  . Levaquin [Levofloxacin In D5w] Other (See Comments)    Unknown reaction  . Lisinopril Swelling    Facial swelling   (Not in a hospital  admission)  Family History  Problem Relation Age of Onset  . Heart disease Mother   . Cancer Neg Hx     Social History   Socioeconomic History  . Marital status: Legally Separated    Spouse name: Not on file  . Number of children: 2  . Years of education: Not on file  . Highest education level: Not on file  Occupational History  . Occupation: retired  Tobacco Use  . Smoking status: Former Smoker    Packs/day: 1.50    Years: 60.00    Pack years: 90.00    Types: Cigarettes    Quit date: 09/29/2012    Years since quitting: 7.8  . Smokeless tobacco: Never Used  Vaping Use  . Vaping Use: Never used  Substance and Sexual Activity  . Alcohol use: No    Alcohol/week: 0.0 standard drinks  . Drug use: No  . Sexual activity: Yes  Other Topics Concern  . Not on file  Social History Narrative   Separated - 1 daughter and 1 son   Retired Horticulturist, commercial   Former smoker, no EtOH   Right handed    Social Determinants of Health   Financial Resource Strain:   . Difficulty of Paying Living Expenses:   Food Insecurity:   . Worried About Charity fundraiser in the Last Year:   . Arboriculturist in the Last Year:   Transportation Needs:   . Film/video editor (Medical):   Marland Kitchen Lack of Transportation (Non-Medical):   Physical Activity:   . Days of Exercise per Week:   . Minutes of Exercise per Session:   Stress:   . Feeling of Stress :   Social Connections:   . Frequency of Communication with Friends and Family:   . Frequency of Social Gatherings with Friends and Family:   . Attends Religious Services:   . Active Member of Clubs or Organizations:   . Attends Archivist Meetings:   Marland Kitchen Marital Status:   Intimate Partner Violence:   . Fear of Current or Ex-Partner:   . Emotionally Abused:   Marland Kitchen Physically Abused:   . Sexually Abused:      Review of Systems: [y] = yes, [ ]  = no       General: Weight gain [] ; Weight loss [ ] ; Anorexia [ ] ; Fatigue [ ] ; Fever [ ] ;  Chills [ ] ; Weakness [ ]     Cardiac: Chest pain/pressure [ ] ; Resting SOB [ ] ; Exertional SOB [ ] ; Orthopnea [ ] ; Pedal Edema [ ] ; Palpitations [ ] ; Syncope [ ] ; Presyncope [ ] ; Paroxysmal nocturnal dyspnea[ ]     Pulmonary: Cough [ ] ; Wheezing[ ] ; Hemoptysis[ ] ; Sputum [ ] ; Snoring [ ]     GI: Vomiting[ ] ; Dysphagia[ ] ; Melena[ ] ; Hematochezia [ ] ; Heartburn[ ] ; Abdominal pain [ ] ; Constipation [ ] ; Diarrhea [ ] ; BRBPR [ ]     GU: Hematuria[ ] ; Dysuria [ ] ; Nocturia[ ]   Vascular: Pain in legs with walking [ ] ; Pain in feet with lying flat [ ] ; Non-healing sores [ ] ; Stroke [ ] ; TIA [ ] ; Slurred  speech [ ] ;    Neuro: Headaches[ ] ; Vertigo[ ] ; Seizures[ ] ; Paresthesias[ ] ;Blurred vision [ ] ; Diplopia [ ] ; Vision changes [ ]     Ortho/Skin: Arthritis [ ] ; Joint pain [ ] ; Muscle pain [ ] ; Joint swelling [ ] ; Back Pain [ ] ; Rash [ ]     Psych: Depression[ ] ; Anxiety[ ]     Heme: Bleeding problems [ ] ; Clotting disorders [ ] ; Anemia [ ]     Endocrine: Diabetes [ ] ; Thyroid dysfunction[ ]   Physical Exam: Blood pressure (!) 171/78, pulse 81, temperature (!) 97.5 F (36.4 C), temperature source Oral, resp. rate (!) 22, height 5\' 9"  (1.753 m), weight 73.6 kg, SpO2 100 %.   GENERAL: Patient is afebrile, Vital signs reviewed, Well appearing, Patient appears comfortable, Alert. EYES: Normal inspection. HEENT:  normocephalic, atraumatic. NECK:  supple , normal inspection, elevated JVD CARD:  regular rate and rhythm, heart sounds normal. RESP:  no respiratory distress, coarse breath sounds bilaterally. ABD: soft, nontender to palpation, nondistended. EXT: 2+ lower extremity edema, WWP. SKIN: color normal, no rash, warm, dry . NEURO: awake & alert, grossly normal  Labs: Lab Results  Component Value Date   BUN 21 08/06/2020   Lab Results  Component Value Date   CREATININE 1.40 (H) 08/06/2020   Lab Results  Component Value Date   NA 138 08/06/2020   K 4.8 08/06/2020   CL 97 (L) 08/06/2020    CO2 27 08/06/2020   Lab Results  Component Value Date   TROPONINI <0.03 04/28/2019   Lab Results  Component Value Date   WBC 8.4 08/06/2020   HGB 10.9 (L) 08/06/2020   HCT 32.0 (L) 08/06/2020   MCV 99.3 08/06/2020   PLT 284 08/06/2020   Lab Results  Component Value Date   CHOL 186 01/18/2010   HDL 39 (L) 01/18/2010   LDLCALC 119 (H) 01/18/2010   TRIG 142 01/18/2010   CHOLHDL 4.8 Ratio 01/18/2010   Lab Results  Component Value Date   ALT 18 08/06/2020   AST 50 (H) 08/06/2020   ALKPHOS 80 08/06/2020   BILITOT 0.7 08/06/2020     EKG: (08/06/20) 1258: NSR, no dynamic ST changes  1710: Focal atrial tachycardia at 128 bpm,   1713: Sinus tach at 116 bpm, closely coupled PVCs of two separate morphologies, third isolated PVC of another morphology      Radiology:     CXR: IMPRESSION: Increased airspace filling opacities in the RIGHT LOWER lobe, suggesting infectious infiltrate in addition to known RIGHT LOWER lobe mass.  CT ANGIO: IMPRESSION: Increasing masslike airspace opacity in the right lower lobe. Previously, the central mass could be visualized, surrounded by airspace disease. Currently, the central mass canal longer be identified within the central airspace opacity. Increasing size of small right pleural effusion.  Food material/debris within the esophagus could be related to dysmotility or reflux.  Coronary artery disease.  No evidence of pulmonary embolus.   CT HEAD WO CONTRAST: IMPRESSION: Atrophy, chronic microvascular disease.  No acute intracranial abnormality.   ASSESSMENT AND PLAN:  CORTEZ FLIPPEN is a 74 y.o. male with recurrent non-small cell lung cancer (most recent SBRT completed 09/03/2019), h/o HFpEF, HTN, HLD, COPD, and dementia who  presents to the ED with syncope.  Cardiology is consulted for assistance.  # Syncope # Recurrent NSCLC # HFpEF # HTN # HLD # COPD  :: Differential for possible etiologies of patient's  syncope are numerous.  The biggest question is whether arrhythmia is the culprit, which  is why cardiology is consulted.  However, contextually (working in the hot sun, after micturition/defecation) vasovagal syncope could also be at play.  Less likely seizure or other acute intracranial process.  Respiratory disease given his lung cancer and infiltrate seen on chest x-ray could also be contributing.  Notably patient with evidence of lower extremity edema and elevated JVD, as well as an elevated BNP is concerning for possible hyperkalemia; although patient states that he is comfortable from a breathing standpoint.  -Admit to medicine to telemetry bed watch for arrhythmias -Check and replete electrolytes; K > 4 and Mg > 2 -Agree with obtaining updated TTE -Trial of mild diuresis with Lasix 20 mg IV -Monitor strict I's and O's and daily weights -Continue excellent complex medical management per admission team    Signed: Clois Dupes 08/06/2020, 7:34 PM

## 2020-08-06 NOTE — ED Notes (Addendum)
While patient was ambulating back to room, from restroom, this nurse noted patient collapse by room door. Patient was noted to be posturing with agonal breathing, not responding to name or painful stimuli. Emergency/panic button activated and patient was assisted back to bed with additional staff help. Dr.  Singer and respiratory therapist at bedside as well. Patient placed on non-rebreather mask and noted to have several arrhythmias on the cardiac monitor.   Patient became oriented after several minutes, with some confusion noted.  Family updated by Dr.  Singer.

## 2020-08-06 NOTE — ED Notes (Signed)
Pt to CT

## 2020-08-06 NOTE — ED Triage Notes (Signed)
Patient arrives via EMS from home due to having a syncopal episode in his yard. Per ems, the patient was cutting grass when his son witnessed him fainting, landing on his knees. The fire department arrived first and noted that the patient was 81% on RA. Pt was placed on a non-rebreather mask. EMS arrives with patient on room air, 94%.   EMS noted that patient had increased swelling in his legs and rales (lung sounds).   EMS vitals 187/96 BP 90 HR 195 CBG

## 2020-08-06 NOTE — ED Provider Notes (Signed)
Emergency Department Provider Note   I have reviewed the triage vital signs and the nursing notes.   HISTORY  Chief Complaint Loss of Consciousness   HPI John [name] is a 74 y.o. male with PMH of HTN, HLD, COPD, CHF, and squamous cell carcinoma of the right lung presents to the ED with syncope while working in the yard today.  Per EMS the patient was working in the yard with his son which is not unusual.  The son apparently witnessed the patient collapsed to the ground and lose consciousness.  He felt mainly to his knees and there was no reported head injury or trauma.  Fire arrived on scene and reported that the patient was hypoxic to the 80s.  They started nonrebreather and when EMS arrived they were able to titrate to nasal cannula currently at 1 L.  No witnessed seizure activity.  The patient does not recall the event.  He does not recall feeling badly prior to this.  He states that overall his energy has been good, he has been eating well, compliant with his medications.  No recent medication changes.  Patient is currently followed by palliative care but not in the hospice program. Review of recent Oncology note mentions patient is on home O2 but not apparently wearing this today.   CODE STATUS: DNR Primary Oncologist: Dr. Julien Nordmann   Past Medical History:  Diagnosis Date  . B12 deficiency   . CHF (congestive heart failure) ([place])   . COPD (chronic obstructive pulmonary disease) ([place])   . Diabetes mellitus without complication ([place])   . Folate deficiency   . Gout   . Hyperlipemia   . Hypertension   . Lung cancer ([place]) dx'd 2011/2017   squamous cell carcinoma of right lower lobe lung  . MI (myocardial infarction) ([place])   . Pulmonary hypertension [facility])     Patient Active Problem List   Diagnosis Date Noted  . FTT (failure to thrive) in adult   . Iron deficiency anemia   . ARF (acute renal failure) ([place]) 09/02/2019  . Hypomagnesemia 09/02/2019  . Acute pain of left knee    . Acute metabolic encephalopathy 44/02/4741  . Goals of care, counseling/discussion 08/05/2019  . Acute renal failure (ARF) ([place]) 07/08/2019  . Hypokalemia 07/08/2019  . Vascular dementia without behavioral disturbance ([place])   . Helicobacter pylori gastritis   . Chronic duodenal ulcer with hemorrhage   . Multiple gastric ulcers   . Acute esophagitis   . Gastrointestinal hemorrhage 04/20/2019  . Vitamin B12 deficiency 04/20/2019  . Folate deficiency 04/20/2019  . Dehydration 04/20/2019  . Hypotension 04/20/2019  . Melena   . Acute blood loss anemia   . CKD (chronic kidney disease), stage III 04/08/2019  . Right lower lobe lung mass 04/08/2019  . Syncope and collapse 04/08/2019  . Symptomatic anemia 04/08/2019  . Occult GI bleeding 04/08/2019  . Hyperglycemia 04/08/2019  . Leukocytosis 04/08/2019  . Syncope 04/08/2019  . Low hemoglobin   . Dyspnea on exertion 08/25/2017  . Bursitis of elbow 03/15/2016  . Obesity (BMI 30-39.9) 12/03/2015  . Primary cancer of right lower lobe of lung ([place]) 06/14/2015  . Solitary pulmonary nodule 03/13/2015  . RVF (right ventricular failure) ([place]) 11/04/2014  . COPD II/III with reversibility  12/19/2012  . Chronic respiratory failure with hypoxia ([place]) 12/18/2012  . Drug-induced hyperglycemia 12/08/2012  . Allergic drug rash 12/06/2012  . Pulmonary HTN ([place]) 03/18/2012  . Abnormal echocardiogram 03/18/2012  . LV dysfunction 03/18/2012  .  COPD exacerbation ([place]) 02/22/2012  . Chronic diastolic CHF (congestive heart failure) ([place]) 02/22/2012  . Diabetes mellitus ([place]) 02/22/2012  . HTN (hypertension) 02/22/2012  . CAD (coronary artery disease) 02/22/2012  . Acute on chronic systolic heart failure ([place]) 02/22/2012    Past Surgical History:  Procedure Laterality Date  . APPENDECTOMY    . BIOPSY  04/21/2019   Procedure: BIOPSY;  Surgeon: Gatha Mayer, MD;  Location: [facility];  Service: Endoscopy;;  . ESOPHAGOGASTRODUODENOSCOPY (EGD)  WITH PROPOFOL N/A 04/21/2019   Procedure: ESOPHAGOGASTRODUODENOSCOPY (EGD) WITH PROPOFOL;  Surgeon: Gatha Mayer, MD;  Location: [facility];  Service: Endoscopy;  Laterality: N/A;  . ESOPHAGOGASTRODUODENOSCOPY (EGD) WITH PROPOFOL N/A 04/29/2019   Procedure: ESOPHAGOGASTRODUODENOSCOPY (EGD) WITH PROPOFOL;  Surgeon: Mauri Pole, MD;  Location: [facility];  Service: Endoscopy;  Laterality: N/A;  . HEMOSTASIS CLIP PLACEMENT  04/29/2019   Procedure: HEMOSTASIS CLIP PLACEMENT;  Surgeon: Mauri Pole, MD;  Location: [facility];  Service: Endoscopy;;  Clip placed as marker not for bleeding control  . IR ANGIOGRAM SELECTIVE EACH ADDITIONAL VESSEL  04/29/2019  . IR ANGIOGRAM SELECTIVE EACH ADDITIONAL VESSEL  04/29/2019  . IR ANGIOGRAM SELECTIVE EACH ADDITIONAL VESSEL  04/29/2019  . IR ANGIOGRAM VISCERAL SELECTIVE  04/29/2019  . IR EMBO ART  VEN HEMORR LYMPH EXTRAV  INC GUIDE ROADMAPPING  04/29/2019  . IR US GUIDE VASC ACCESS RIGHT  04/29/2019  . LUNG BIOPSY      Allergies Levaquin [levofloxacin in d5w] and Lisinopril  Family History  Problem Relation Age of Onset  . Heart disease Mother   . Cancer Neg Hx     Social History Social History   Tobacco Use  . Smoking status: Former Smoker    Packs/day: 1.50    Years: 60.00    Pack years: 90.00    Types: Cigarettes    Quit date: 09/29/2012    Years since quitting: 7.8  . Smokeless tobacco: Never Used  Vaping Use  . Vaping Use: Never used  Substance Use Topics  . Alcohol use: No    Alcohol/week: 0.0 standard drinks  . Drug use: No    Review of Systems  Constitutional: No fever/chills Eyes: No visual changes. ENT: No sore throat. Cardiovascular: Denies chest pain. Positive syncope.  Respiratory: Denies shortness of breath. Low O2 sat on scene with EMS.  Gastrointestinal: No abdominal pain.  No nausea, no vomiting.  No diarrhea.  No constipation. Genitourinary: Negative for dysuria. Musculoskeletal: Negative for back  pain. Skin: Negative for rash. Neurological: Negative for headaches, focal weakness or numbness.  10-point ROS otherwise negative.  ____________________________________________   PHYSICAL EXAM:  VITAL SIGNS: ED Triage Vitals  Enc Vitals Group     BP 08/06/20 1306 (!) 116/52     Pulse Rate 08/06/20 1306 76     Resp 08/06/20 1306 14     Temp 08/06/20 1303 (!) 97.5 F (36.4 C)     Temp Source 08/06/20 1303 Oral     SpO2 08/06/20 1257 94 %     Weight 08/06/20 1307 162 lb 4.1 oz (73.6 kg)     Height 08/06/20 1307 5\' 9"  (1.753 m)   Constitutional: Alert with some repetitive statements. Well appearing and in no acute distress. Eyes: Conjunctivae are normal.  Head: Atraumatic. Nose: No congestion/rhinnorhea. Mouth/Throat: Mucous membranes are moist.   Neck: No stridor.  Cardiovascular: Normal rate, regular rhythm. Good peripheral circulation. Grossly normal heart sounds.   Respiratory: Normal respiratory effort.  No retractions. Lungs with  rhonchi at the right base with raspy upper airway sounds intermittently that clear with cough. No stridor.  Gastrointestinal: Soft and nontender. No distention.  Musculoskeletal: No lower extremity tenderness. 1+ pitting edema in the LLE only. No tenderness. No gross deformities of extremities. Neurologic:  Normal speech and language. No gross focal neurologic deficits are appreciated.  Skin:  Skin is warm, dry and intact. No rash noted.   ____________________________________________   LABS (all labs ordered are listed, but only abnormal results are displayed)  Labs Reviewed  COMPREHENSIVE METABOLIC PANEL - Abnormal; Notable for the following components:      Result Value   Chloride 97 (*)    Glucose, Bld 126 (*)    Creatinine, Ser 1.38 (*)    Calcium 8.7 (*)    Total Protein 5.7 (*)    Albumin 2.5 (*)    AST 50 (*)    GFR calc non Af Amer 50 (*)    GFR calc Af Amer 58 (*)    All other components within normal limits  BRAIN  NATRIURETIC PEPTIDE - Abnormal; Notable for the following components:   B Natriuretic Peptide 209.9 (*)    All other components within normal limits  CBC WITH DIFFERENTIAL/PLATELET - Abnormal; Notable for the following components:   RBC 2.82 (*)    Hemoglobin 8.4 (*)    HCT 28.0 (*)    RDW 18.5 (*)    Lymphs Abs 0.2 (*)    All other components within normal limits  MAGNESIUM - Abnormal; Notable for the following components:   Magnesium 1.6 (*)    All other components within normal limits  I-STAT CHEM 8, ED - Abnormal; Notable for the following components:   Chloride 97 (*)    Creatinine, Ser 1.40 (*)    Glucose, Bld 123 (*)    Hemoglobin 10.9 (*)    HCT 32.0 (*)    All other components within normal limits  SARS CORONAVIRUS 2 BY RT PCR (HOSPITAL ORDER, [facility])  URINE CULTURE  CULTURE, BLOOD (ROUTINE X 2)  CULTURE, BLOOD (ROUTINE X 2)  URINALYSIS, ROUTINE W REFLEX MICROSCOPIC  ETHANOL  HEMOGLOBIN A1C  PROCALCITONIN  BASIC METABOLIC PANEL  MAGNESIUM  CBG MONITORING, ED  TROPONIN I (HIGH SENSITIVITY)  TROPONIN I (HIGH SENSITIVITY)  TROPONIN I (HIGH SENSITIVITY)   ____________________________________________  EKG   EKG Interpretation  Date/Time:  Sunday August 06 2020 17:13:57 EDT Ventricular Rate:  116 PR Interval:    QRS Duration: 87 QT Interval:  303 QTC Calculation: 421 R Axis:   10 Text Interpretation: Sinus tachycardia Ventricular premature complex Aberrant conduction of SV complex(es) Probable left atrial enlargement Confirmed by Virgel Manifold [phone]) on 08/06/2020 5:43:53 PM       ____________________________________________  RADIOLOGY  CT Head Wo Contrast  Result Date: 08/06/2020 CLINICAL DATA:  Mental status changes EXAM: CT HEAD WITHOUT CONTRAST TECHNIQUE: Contiguous axial images were obtained from the base of the skull through the vertex without intravenous contrast. COMPARISON:  08/01/2019 FINDINGS: Brain: There is  atrophy and chronic small vessel disease changes. No acute intracranial abnormality. Specifically, no hemorrhage, hydrocephalus, mass lesion, acute infarction, or significant intracranial injury. Vascular: No hyperdense vessel or unexpected calcification. Skull: No acute calvarial abnormality. Sinuses/Orbits: No acute findings. Old left medial orbital wall blowout fracture. Other: None IMPRESSION: Atrophy, chronic microvascular disease. No acute intracranial abnormality. Electronically Signed   By: Rolm Baptise M.D.   On: 08/06/2020 15:09   CT Angio Chest PE W  and/or Wo Contrast  Result Date: 08/06/2020 CLINICAL DATA:  Syncope EXAM: CT ANGIOGRAPHY CHEST WITH CONTRAST TECHNIQUE: Multidetector CT imaging of the chest was performed using the standard protocol during bolus administration of intravenous contrast. Multiplanar CT image reconstructions and MIPs were obtained to evaluate the vascular anatomy. CONTRAST:  36mL OMNIPAQUE IOHEXOL 350 MG/ML SOLN COMPARISON:  06/20/2020 FINDINGS: Cardiovascular: Heart is normal size. Coronary artery and aortic atherosclerosis. No aneurysm. No filling defects in the pulmonary arteries to suggest pulmonary emboli. Mediastinum/Nodes: No mediastinal, hilar, or axillary adenopathy. Trachea and thyroid unremarkable. Stomach is delete that esophagus is filled with food material/debris. Lungs/Pleura: Masslike airspace opacity in the right lower lobe. Overall, airspace disease has increased since prior study. Small right pleural effusion, also increased since prior study. Left lung clear. Upper Abdomen: No acute findings. Musculoskeletal: Chest wall soft tissues are unremarkable. No acute bony abnormality. Advanced degenerative changes with large flowing anterior osteophytes in the thoracic spine. Review of the MIP images confirms the above findings. IMPRESSION: Increasing masslike airspace opacity in the right lower lobe. Previously, the central mass could be visualized, surrounded by  airspace disease. Currently, the central mass canal longer be identified within the central airspace opacity. Increasing size of small right pleural effusion. Food material/debris within the esophagus could be related to dysmotility or reflux. Coronary artery disease. No evidence of pulmonary embolus. Aortic Atherosclerosis (ICD10-I70.0). Electronically Signed   By: Rolm Baptise M.D.   On: 08/06/2020 15:25   DG Chest Portable 1 View  Result Date: 08/06/2020 CLINICAL DATA:  Hypoxemia. Syncopal episode in his yard. History of RIGHT lung cancer 2011, LEFT lung cancer in 2016. Disease recurrence in May of 2020. EXAM: PORTABLE CHEST 1 VIEW COMPARISON:  08/09/2019 and CT chest on 06/20/2020 FINDINGS: The heart is enlarged and stable in configuration. Patchy airspace filling opacities in the RIGHT LOWER lobe are more opaque, suggesting infectious changes on pre-existing RIGHT LOWER lobe mass. LEFT lung is clear. No pulmonary edema. IMPRESSION: Increased airspace filling opacities in the RIGHT LOWER lobe, suggesting infectious infiltrate in addition to known RIGHT LOWER lobe mass. Electronically Signed   By: Nolon Nations M.D.   On: 08/06/2020 13:45   VAS [place] LOWER EXTREMITY VENOUS (DVT) ([facility] and [facility] 7a-7p)  Result Date: 08/06/2020  Lower Venous DVTStudy Indications: Swelling.  Comparison Study: No prior studies. Performing Technologist: Darlin Coco  Examination Guidelines: A complete evaluation includes B-mode imaging, spectral Doppler, color Doppler, and power Doppler as needed of all accessible portions of each vessel. Bilateral testing is considered an integral part of a complete examination. Limited examinations for reoccurring indications may be performed as noted. The reflux portion of the exam is performed with the patient in reverse Trendelenburg.  +-----+---------------+---------+-----------+----------+--------------+ RIGHTCompressibilityPhasicitySpontaneityPropertiesThrombus Aging  +-----+---------------+---------+-----------+----------+--------------+ CFV  Full           Yes      Yes                                 +-----+---------------+---------+-----------+----------+--------------+   +---------+---------------+---------+-----------+----------+--------------+ LEFT     CompressibilityPhasicitySpontaneityPropertiesThrombus Aging +---------+---------------+---------+-----------+----------+--------------+ CFV      Full           Yes      Yes                                 +---------+---------------+---------+-----------+----------+--------------+ SFJ      Full                                                        +---------+---------------+---------+-----------+----------+--------------+  FV Prox  Full                                                        +---------+---------------+---------+-----------+----------+--------------+ FV Mid   Full                                                        +---------+---------------+---------+-----------+----------+--------------+ FV DistalFull                                                        +---------+---------------+---------+-----------+----------+--------------+ PFV      Full                                                        +---------+---------------+---------+-----------+----------+--------------+ POP      Full           Yes      Yes                                 +---------+---------------+---------+-----------+----------+--------------+ PTV      Full                                                        +---------+---------------+---------+-----------+----------+--------------+ PERO     Full                                                        +---------+---------------+---------+-----------+----------+--------------+     Summary: RIGHT: - No evidence of common femoral vein obstruction.  LEFT: - There is no evidence of deep vein thrombosis in the  lower extremity.  - No cystic structure found in the popliteal fossa.  *See table(s) above for measurements and observations. Electronically signed by Harold Barban MD on 08/06/2020 at 4:56:58 PM.    Final     ____________________________________________   PROCEDURES  Procedure(s) performed:   Procedures  CRITICAL CARE Performed by: Margette Fast Total critical care time: 35 minutes Critical care time was exclusive of separately billable procedures and treating other patients. Critical care was necessary to treat or prevent imminent or life-threatening deterioration. Critical care was time spent personally by me on the following activities: development of treatment plan with patient and/or surrogate as well as nursing, discussions with consultants, evaluation of patient's response to treatment, examination of patient, obtaining history from patient or surrogate, ordering and performing treatments and interventions, ordering and review of laboratory studies, ordering and review of radiographic studies,  pulse oximetry and re-evaluation of patient's condition.  Nanda Quinton, MD Emergency Medicine  ____________________________________________   INITIAL IMPRESSION / ASSESSMENT AND PLAN / ED COURSE  Pertinent labs & imaging results that were available during my care of the patient were reviewed by me and considered in my medical decision making (see chart for details).   Patient presents to the emergency department evaluation of syncope today.  Found with oxygen saturations reportedly in the 80s and has improved here with minimal oxygen.  Patient is apparently supposed to be on home oxygen according to clinic notes available for my review.  He is awake and alert now with some repetitive statements but following commands appropriately.  Denies any active pain.  Plan for labs including chest x-ray with rhonchi at the right base.  He is afebrile.  Plan for Covid testing as well the patient is fully  vaccinated.  With some of the repetitive statements and question of some confusion will obtain Noncon head CT especially in the setting of fall. Also plan for DVT study of the LLE.   Patient's x-ray with question infiltrate on the right in addition to his known cancer.  Plan for CTA of the chest given his hypoxemia, syncope, and worsening opacity on the right.  Patient is at elevated risk for PE.  Will start antibiotics.  Care transferred to Dr. Wilson Singer pending CT.  ____________________________________________  FINAL CLINICAL IMPRESSION(S) / ED DIAGNOSES  Final diagnoses:  Syncope and collapse     MEDICATIONS GIVEN DURING THIS VISIT:  Medications  acetaminophen (TYLENOL) tablet 650 mg (has no administration in time range)  allopurinol (ZYLOPRIM) tablet 300 mg (has no administration in time range)  traMADol (ULTRAM) tablet 50 mg (has no administration in time range)  megestrol (MEGACE) 400 MG/10ML suspension 400 mg (has no administration in time range)  amLODipine (NORVASC) tablet 2.5 mg (has no administration in time range)  atorvastatin (LIPITOR) tablet 20 mg (20 mg Oral Not Given 08/06/20 2100)  bisoprolol (ZEBETA) tablet 5 mg (has no administration in time range)  donepezil (ARICEPT) tablet 5 mg (5 mg Oral Given 08/06/20 2252)  QUEtiapine (SEROQUEL) tablet 25 mg (25 mg Oral Given 08/06/20 2252)  pantoprazole (PROTONIX) EC tablet 40 mg (has no administration in time range)  senna-docusate (Senokot-S) tablet 1 tablet (1 tablet Oral Given 08/06/20 2252)  sucralfate (CARAFATE) tablet 1 g (1 g Oral Not Given 08/06/20 2257)  ferrous sulfate tablet 325 mg (has no administration in time range)  vitamin B-12 (CYANOCOBALAMIN) tablet 1,000 mcg (has no administration in time range)  cholecalciferol (VITAMIN D3) tablet 2,000 Units (2,000 Units Oral Not Given 08/06/20 2100)  magnesium oxide (MAG-OX) tablet 400 mg (400 mg Oral Given 08/06/20 2252)  multivitamin with minerals tablet 1 tablet (1 tablet Oral Not  Given 08/06/20 2100)  potassium chloride SA (KLOR-CON) CR tablet 20 mEq (has no administration in time range)  albuterol (PROVENTIL) (2.5 MG/3ML) 0.083% nebulizer solution 2.5 mg (has no administration in time range)  mometasone-formoterol (DULERA) 200-5 MCG/ACT inhaler 2 puff (2 puffs Inhalation Not Given 08/06/20 2231)  enoxaparin (LOVENOX) injection 40 mg (40 mg Subcutaneous Given 08/06/20 1957)  sodium chloride flush (NS) 0.9 % injection 3 mL (3 mLs Intravenous Given 08/06/20 2253)  sodium chloride flush (NS) 0.9 % injection 3 mL (has no administration in time range)  0.9 %  sodium chloride infusion (has no administration in time range)  cefTRIAXone (ROCEPHIN) 1 g in sodium chloride 0.9 % 100 mL IVPB (0 g Intravenous Stopped 08/06/20  1651)  azithromycin (ZITHROMAX) 500 mg in sodium chloride 0.9 % 250 mL IVPB (0 mg Intravenous Stopped 08/06/20 1651)  iohexol (OMNIPAQUE) 350 MG/ML injection 75 mL (75 mLs Intravenous Contrast Given 08/06/20 1518)  magnesium sulfate IVPB 2 g 50 mL (0 g Intravenous Stopped 08/06/20 2130)     NEW OUTPATIENT MEDICATIONS STARTED DURING THIS VISIT:  Current Discharge Medication List    START taking these medications   Details  amoxicillin (AMOXIL) 500 MG capsule Take 1 capsule (500 mg total) by mouth 3 (three) times daily. Qty: 21 capsule, Refills: 0    azithromycin (ZITHROMAX) 250 MG tablet Take 1 tablet (250 mg total) by mouth daily. Take first 2 tablets together, then 1 every day until finished. Qty: 6 tablet, Refills: 0        Note:  This document was prepared using Dragon voice recognition software and may include unintentional dictation errors.  Nanda Quinton, MD, [facility] Emergency Medicine    Isys Tietje, Wonda Old[facility], MD 08/07/20 [phone]

## 2020-08-06 NOTE — Discharge Instructions (Addendum)
You will be mailed a cardiac monitor to your rehab facility. Please follow the instructions for putting this on and wearing for 2 weeks (14 days). Then follow the instructions that come with the device for returning it.    Your imaging today is questionable for pneumonia versus known malignancy in your R lung. We are placing you on antibiotics for this reason. Your testing otherwise looks reassuring and stable from recent labs. Make sure you stay well hydrated. Follow-up with your PCP.

## 2020-08-06 NOTE — Progress Notes (Signed)
Lower extremity venous LT study completed.   Results relayed to MD.  See Cv Proc for preliminary results.   Darlin Coco

## 2020-08-06 NOTE — ED Provider Notes (Addendum)
Pt initially up for discharge. He need to use the bathroom though to have a bowel movement. As he was walking back to the room he collapsed and became unresponsive. It does not sound like he had any significant prodrome. I came from an adjacent exam room and he was on the ground. He was unresponsive. Had a pulse. Agonal breathing. I did not see any abnormal movement or posturing. He was lifted onto the stretcher and place on the monitor. He did get assisted ventilations for a couple minutes and his respiratory effort improved. Glucose 97. Sinus tachycardia on monitor with multifocal PVCs. Within ~5 minutes from initial event he was responding to questioning although confused. Denies pain. 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 Discussed with his daughter. With recurrent syncopal events and concern for possible dysrhythmia as precipitant will admit for observation on telemetry.       Virgel Manifold, MD 08/06/20 Tresa Dolph

## 2020-08-07 ENCOUNTER — Inpatient Hospital Stay (HOSPITAL_COMMUNITY): Payer: Medicare Other

## 2020-08-07 DIAGNOSIS — I442 Atrioventricular block, complete: Secondary | ICD-10-CM

## 2020-08-07 DIAGNOSIS — I5031 Acute diastolic (congestive) heart failure: Secondary | ICD-10-CM

## 2020-08-07 DIAGNOSIS — F0150 Vascular dementia without behavioral disturbance: Secondary | ICD-10-CM

## 2020-08-07 DIAGNOSIS — N1832 Chronic kidney disease, stage 3b: Secondary | ICD-10-CM

## 2020-08-07 DIAGNOSIS — I495 Sick sinus syndrome: Secondary | ICD-10-CM

## 2020-08-07 DIAGNOSIS — C3431 Malignant neoplasm of lower lobe, right bronchus or lung: Secondary | ICD-10-CM

## 2020-08-07 LAB — MAGNESIUM: Magnesium: 1.9 mg/dL (ref 1.7–2.4)

## 2020-08-07 LAB — ECHOCARDIOGRAM COMPLETE
Area-P 1/2: 3.37 cm2
Height: 65 in
S' Lateral: 3.1 cm
Weight: 2347.2 oz

## 2020-08-07 LAB — URINE CULTURE: Culture: NO GROWTH

## 2020-08-07 LAB — BASIC METABOLIC PANEL
Anion gap: 9 (ref 5–15)
BUN: 16 mg/dL (ref 8–23)
CO2: 33 mmol/L — ABNORMAL HIGH (ref 22–32)
Calcium: 8.7 mg/dL — ABNORMAL LOW (ref 8.9–10.3)
Chloride: 99 mmol/L (ref 98–111)
Creatinine, Ser: 1.27 mg/dL — ABNORMAL HIGH (ref 0.61–1.24)
GFR calc Af Amer: >60 mL/min (ref >60)
GFR calc non Af Amer: 55 mL/min — ABNORMAL LOW (ref >60)
Glucose, Bld: 79 mg/dL (ref 70–99)
Potassium: 4.7 mmol/L (ref 3.5–5.1)
Sodium: 141 mmol/L (ref 135–145)

## 2020-08-07 LAB — TROPONIN I (HIGH SENSITIVITY): Troponin I (High Sensitivity): 10 ng/L (ref <18)

## 2020-08-07 NOTE — Plan of Care (Signed)
°  Problem: Clinical Measurements: Goal: Will remain free from infection Outcome: Progressing   Problem: Activity: Goal: Risk for activity intolerance will decrease Outcome: Progressing   Problem: Nutrition: Goal: Adequate nutrition will be maintained Outcome: Progressing

## 2020-08-07 NOTE — Progress Notes (Signed)
Spoke to John [name] and provided update on her father's current progress and the plan of care at this moment. Patient's daughter would like an update from MD if there are any medication changes or changes to the plan of care.

## 2020-08-07 NOTE — Progress Notes (Signed)
DAILY PROGRESS NOTE   Patient Name: John [name] Date of Encounter: 08/07/2020 Cardiologist: No primary care provider on file.  Chief Complaint   Admitted for recurrent syncope  Patient Profile   John [name] a 74 y.o.malewith recurrent non-small cell lung cancer (most recent SBRT completed 09/03/2019), h/o HFpEF, HTN, HLD, COPD, and dementia who  presents to the ED with syncope.  Subjective   Noted to have AIVR overnight in the 40's. No recorded diuresis. BP labile, but appears elevated. SPO2 100% on 3L [place]. Creatinine improved to 1.27 today. H/H now 10/32. Electrolytes are normal. Had atrial tachy in the ER on EKG yesterday. On bisoprolol 5 mg daily.  Objective   Vitals:   08/07/20 0000 08/07/20 0430 08/07/20 0738 08/07/20 0748  BP:  (!) 135/124 (!) 160/68   Pulse:  97 79   Resp:  20 15   Temp:   98.4 F (36.9 C)   TempSrc:  Oral Oral   SpO2:  94% 100% 100%  Weight: 66.5 kg     Height:        Intake/Output Summary (Last 24 hours) at 08/07/2020 0854 Last data filed at 08/06/2020 2358 Gross per 24 hour  Intake 350 ml  Output 200 ml  Net 150 ml   Filed Weights   08/06/20 1307 08/06/20 2116 08/07/20 0000  Weight: 73.6 kg 66.5 kg 66.5 kg    Physical Exam   General appearance: appears older than stated age, no distress and demented Neck: no carotid bruit, no JVD and thyroid not enlarged, symmetric, no tenderness/mass/nodules Lungs: diminished breath sounds bilaterally, rhonchi bilaterally and wheezes bilaterally Heart: regular rate and rhythm Abdomen: soft, non-tender; bowel sounds normal; no masses,  no organomegaly Extremities: extremities normal, atraumatic, no cyanosis or edema Pulses: 2+ and symmetric Skin: Skin color, texture, turgor normal. No rashes or lesions Neurologic: Mental status: Somnolent, awakens to stimulus, confused Psych: demented  Inpatient Medications    Scheduled Meds: . allopurinol  300 mg Oral Daily  . amLODipine  2.5 mg Oral  Daily  . atorvastatin  20 mg Oral Daily  . bisoprolol  5 mg Oral Daily  . cholecalciferol  2,000 Units Oral Daily  . donepezil  5 mg Oral QHS  . enoxaparin (LOVENOX) injection  40 mg Subcutaneous Q24H  . ferrous sulfate  325 mg Oral Q M,W,F  . magnesium oxide  400 mg Oral BID  . megestrol  400 mg Oral Daily  . mometasone-formoterol  2 puff Inhalation BID  . multivitamin with minerals  1 tablet Oral Daily  . pantoprazole  40 mg Oral BID AC  . potassium chloride SA  20 mEq Oral BID  . QUEtiapine  25 mg Oral BID  . senna-docusate  1 tablet Oral QHS  . sodium chloride flush  3 mL Intravenous Q12H  . sucralfate  1 g Oral QID  . cyanocobalamin  1,000 mcg Oral Daily    Continuous Infusions: . sodium chloride      PRN Meds: sodium chloride, acetaminophen, albuterol, sodium chloride flush, traMADol   Labs   Results for orders placed or performed during the hospital encounter of 08/06/20 (from the past 48 hour(s))  Comprehensive metabolic panel     Status: Abnormal   Collection Time: 08/06/20  1:11 PM  Result Value Ref Range   Sodium 138 135 - 145 mmol/L   Potassium 5.1 3.5 - 5.1 mmol/L    Comment: SLIGHT HEMOLYSIS   Chloride 97 (L) 98 - 111 mmol/L  CO2 27 22 - 32 mmol/L   Glucose, Bld 126 (H) 70 - 99 mg/dL    Comment: Glucose reference range applies only to samples taken after fasting for at least 8 hours.   BUN 20 8 - 23 mg/dL   Creatinine, Ser 1.38 (H) 0.61 - 1.24 mg/dL   Calcium 8.7 (L) 8.9 - 10.3 mg/dL   Total Protein 5.7 (L) 6.5 - 8.1 g/dL   Albumin 2.5 (L) 3.5 - 5.0 g/dL   AST 50 (H) 15 - 41 U/L   ALT 18 0 - 44 U/L   Alkaline Phosphatase 80 38 - 126 U/L   Total Bilirubin 0.7 0.3 - 1.2 mg/dL   GFR calc non Af Amer 50 (L) >60 mL/min   GFR calc Af Amer 58 (L) >60 mL/min   Anion gap 14 5 - 15    Comment: Performed at [facility] [address] [place], [place] [address]  Troponin I (High Sensitivity)     Status: None   Collection Time: 08/06/20  1:11 PM    Result Value Ref Range   Troponin I (High Sensitivity) 6 <18 ng/L    Comment: (NOTE) Elevated high sensitivity troponin I (hsTnI) values and significant  changes across serial measurements may suggest ACS but many other  chronic and acute conditions are known to elevate hsTnI results.  Refer to the Links section for chest pain algorithms and additional  guidance. Performed at [facility], [place] [address], [place], [place] [address]   Brain natriuretic peptide     Status: Abnormal   Collection Time: 08/06/20  1:11 PM  Result Value Ref Range   B Natriuretic Peptide 209.9 (H) 0.0 - 100.0 pg/mL    Comment: Performed at [facility] [address] [place], [place] [address]  CBC with Differential     Status: Abnormal   Collection Time: 08/06/20  1:11 PM  Result Value Ref Range   WBC 8.4 4.0 - 10.5 K/uL    Comment: WHITE COUNT CONFIRMED ON SMEAR   RBC 2.82 (L) 4.22 - 5.81 MIL/uL   Hemoglobin 8.4 (L) 13.0 - 17.0 g/dL   HCT 28.0 (L) 39 - 52 %   MCV 99.3 80.0 - 100.0 fL   MCH 29.8 26.0 - 34.0 pg   MCHC 30.0 30.0 - 36.0 g/dL   RDW 18.5 (H) 11.5 - 15.5 %   Platelets 284 150 - 400 K/uL   nRBC 0.0 0.0 - 0.2 %   Neutrophils Relative % 86 %   Neutro Abs 7.2 1.7 - 7.7 K/uL   Lymphocytes Relative 3 %   Lymphs Abs 0.2 (L) 0.7 - 4.0 K/uL   Monocytes Relative 8 %   Monocytes Absolute 0.7 0 - 1 K/uL   Eosinophils Relative 3 %   Eosinophils Absolute 0.2 0 - 0 K/uL   Basophils Relative 0 %   Basophils Absolute 0.0 0 - 0 K/uL   Immature Granulocytes 0 %   Abs Immature Granulocytes 0.03 0.00 - 0.07 K/uL    Comment: Performed at [facility], [place] [address], [place], [place] [address]  Ethanol     Status: None   Collection Time: 08/06/20  1:12 PM  Result Value Ref Range   Alcohol, Ethyl (B) <10 <10 mg/dL    Comment: (NOTE) Lowest detectable limit for serum alcohol is 10 mg/dL.  For medical purposes only. Performed at [facility], [place] [address], [place],  [place] [address]  SARS Coronavirus 2 by RT PCR (hospital order, performed in [facility] hospital lab) Nasopharyngeal Nasopharyngeal Swab     Status: None   Collection Time: 08/06/20  1:28 PM   Specimen: Nasopharyngeal Swab  Result Value Ref Range   SARS Coronavirus 2 NEGATIVE NEGATIVE    Comment: (NOTE) SARS-CoV-2 target nucleic acids are NOT DETECTED.  The SARS-CoV-2 RNA is generally detectable in upper and lower respiratory specimens during the acute phase of infection. The lowest concentration of SARS-CoV-2 viral copies this assay can detect is 250 copies / mL. A negative result does not preclude SARS-CoV-2 infection and should not be used as the sole basis for treatment or other patient management decisions.  A negative result may occur with improper specimen collection / handling, submission of specimen other than nasopharyngeal swab, presence of viral mutation(s) within the areas targeted by this assay, and inadequate number of viral copies (<250 copies / mL). A negative result must be combined with clinical observations, patient history, and epidemiological information.  Fact Sheet for Patients:   StrictlyIdeas.no  Fact Sheet for Healthcare Providers: BankingDealers.co.za  This test is not yet approved or  cleared by the [place] FDA and has been authorized for detection and/or diagnosis of SARS-CoV-2 by FDA under an Emergency Use Authorization (EUA).  This EUA will remain in effect (meaning this test can be used) for the duration of the COVID-19 declaration under Section 564(b)(1) of the Act, 21 U.S.C. section 360bbb-3(b)(1), unless the authorization is terminated or revoked sooner.  Performed at [facility], [place] [address] [place], [place] [address]   Urinalysis, Routine w reflex microscopic Urine, Clean Catch     Status: None   Collection Time: 08/06/20  2:48 PM  Result Value Ref Range   Color, Urine YELLOW  YELLOW   APPearance CLEAR CLEAR   Specific Gravity, Urine 1.012 1.005 - 1.030   pH 7.0 5.0 - 8.0   Glucose, UA NEGATIVE NEGATIVE mg/dL   Hgb urine dipstick NEGATIVE NEGATIVE   Bilirubin Urine NEGATIVE NEGATIVE   Ketones, ur NEGATIVE NEGATIVE mg/dL   Protein, ur NEGATIVE NEGATIVE mg/dL   Nitrite NEGATIVE NEGATIVE   Leukocytes,Ua NEGATIVE NEGATIVE    Comment: Performed at [facility] [address], [place], [place] [address]  Culture, blood (routine x 2)     Status: None (Preliminary result)   Collection Time: 08/06/20  3:28 PM   Specimen: BLOOD RIGHT FOREARM  Result Value Ref Range   Specimen Description BLOOD RIGHT FOREARM    Special Requests      BOTTLES DRAWN AEROBIC AND ANAEROBIC Blood Culture results may not be optimal due to an inadequate volume of blood received in culture bottles   Culture      NO GROWTH < 24 HOURS Performed at [facility], [place] [address], [place], [place] [address]    Report Status PENDING   Troponin I (High Sensitivity)     Status: None   Collection Time: 08/06/20  3:28 PM  Result Value Ref Range   Troponin I (High Sensitivity) 9 <18 ng/L    Comment: (NOTE) Elevated high sensitivity troponin I (hsTnI) values and significant  changes across serial measurements may suggest ACS but many other  chronic and acute conditions are known to elevate hsTnI results.  Refer to the "Links" section for chest pain algorithms and additional  guidance. Performed at [facility], [place] [address] [place], [place] [address]   Magnesium  Status: Abnormal   Collection Time: 08/06/20  3:28 PM  Result Value Ref Range   Magnesium 1.6 (L) 1.7 - 2.4 mg/dL    Comment: Performed at [facility] [address], [place], [place] [address]  Culture, blood (routine x 2)     Status: None (Preliminary result)   Collection Time: 08/06/20  3:37 PM   Specimen: BLOOD LEFT FOREARM  Result Value Ref Range   Specimen Description BLOOD LEFT FOREARM     Special Requests      BOTTLES DRAWN AEROBIC AND ANAEROBIC Blood Culture results may not be optimal due to an inadequate volume of blood received in culture bottles   Culture      NO GROWTH < 24 HOURS Performed at [facility], [place] [address] [place], [place] [address]    Report Status PENDING   POC CBG, ED     Status: None   Collection Time: 08/06/20  5:09 PM  Result Value Ref Range   Glucose-Capillary 97 70 - 99 mg/dL    Comment: Glucose reference range applies only to samples taken after fasting for at least 8 hours.  I-stat chem 8, ed     Status: Abnormal   Collection Time: 08/06/20  5:16 PM  Result Value Ref Range   Sodium 138 135 - 145 mmol/L   Potassium 4.8 3.5 - 5.1 mmol/L   Chloride 97 (L) 98 - 111 mmol/L   BUN 21 8 - 23 mg/dL   Creatinine, Ser 1.40 (H) 0.61 - 1.24 mg/dL   Glucose, Bld 123 (H) 70 - 99 mg/dL    Comment: Glucose reference range applies only to samples taken after fasting for at least 8 hours.   Calcium, Ion 1.25 1.15 - 1.40 mmol/L   TCO2 31 22 - 32 mmol/L   Hemoglobin 10.9 (L) 13.0 - 17.0 g/dL   HCT 32.0 (L) 39 - 52 %  Hemoglobin A1c     Status: None   Collection Time: 08/06/20  9:38 PM  Result Value Ref Range   Hgb A1c MFr Bld 4.9 4.8 - 5.6 %    Comment: (NOTE) Pre diabetes:          5.7%-6.4%  Diabetes:              >6.4%  Glycemic control for   <7.0% adults with diabetes    Mean Plasma Glucose 93.93 mg/dL    Comment: Performed at [facility] [address] [place], [place] [address]  Procalcitonin - Baseline     Status: None   Collection Time: 08/06/20  9:38 PM  Result Value Ref Range   Procalcitonin <0.10 ng/mL    Comment:        Interpretation: PCT (Procalcitonin) <= 0.5 ng/mL: Systemic infection (sepsis) is not likely. Local bacterial infection is possible. (NOTE)       Sepsis PCT Algorithm           Lower Respiratory Tract                                      Infection PCT Algorithm    ----------------------------      ----------------------------         PCT < 0.25 ng/mL                PCT < 0.10 ng/mL          Strongly  encourage             Strongly discourage   discontinuation of antibiotics    initiation of antibiotics    ----------------------------     -----------------------------       PCT 0.25 - 0.50 ng/mL            PCT 0.10 - 0.25 ng/mL               OR       >80% decrease in PCT            Discourage initiation of                                            antibiotics      Encourage discontinuation           of antibiotics    ----------------------------     -----------------------------         PCT >= 0.50 ng/mL              PCT 0.26 - 0.50 ng/mL               AND        <80% decrease in PCT             Encourage initiation of                                             antibiotics       Encourage continuation           of antibiotics    ----------------------------     -----------------------------        PCT >= 0.50 ng/mL                  PCT > 0.50 ng/mL               AND         increase in PCT                  Strongly encourage                                      initiation of antibiotics    Strongly encourage escalation           of antibiotics                                     -----------------------------                                           PCT <= 0.25 ng/mL                                                 OR                                        >  80% decrease in PCT                                      Discontinue / Do not initiate                                             antibiotics  Performed at [facility], [place] [address], [place], [place] [address]   Basic metabolic panel     Status: Abnormal   Collection Time: 08/07/20  7:32 AM  Result Value Ref Range   Sodium 141 135 - 145 mmol/L   Potassium 4.7 3.5 - 5.1 mmol/L   Chloride 99 98 - 111 mmol/L   CO2 33 (H) 22 - 32 mmol/L   Glucose, Bld 79 70 - 99 mg/dL    Comment: Glucose reference range  applies only to samples taken after fasting for at least 8 hours.   BUN 16 8 - 23 mg/dL   Creatinine, Ser 1.27 (H) 0.61 - 1.24 mg/dL   Calcium 8.7 (L) 8.9 - 10.3 mg/dL   GFR calc non Af Amer 55 (L) >60 mL/min   GFR calc Af Amer >60 >60 mL/min   Anion gap 9 5 - 15    Comment: Performed at [facility] [address], [place], [place] [address]  Magnesium     Status: None   Collection Time: 08/07/20  7:32 AM  Result Value Ref Range   Magnesium 1.9 1.7 - 2.4 mg/dL    Comment: Performed at [facility] [address], [place], [place] [address]  Troponin I (High Sensitivity)     Status: None   Collection Time: 08/07/20  7:32 AM  Result Value Ref Range   Troponin I (High Sensitivity) 10 <18 ng/L    Comment: (NOTE) Elevated high sensitivity troponin I (hsTnI) values and significant  changes across serial measurements may suggest ACS but many other  chronic and acute conditions are known to elevate hsTnI results.  Refer to the "Links" section for chest pain algorithms and additional  guidance. Performed at [facility], [place] [address], [place], [place] [address]     ECG   NSR - Personally Reviewed  Telemetry   Noted to have slow wide-complex rhythm at 43 overnight, likely AIVR/slow VT - Personally Reviewed  Radiology    CT Head Wo Contrast  Result Date: 08/06/2020 CLINICAL DATA:  Mental status changes EXAM: CT HEAD WITHOUT CONTRAST TECHNIQUE: Contiguous axial images were obtained from the base of the skull through the vertex without intravenous contrast. COMPARISON:  08/01/2019 FINDINGS: Brain: There is atrophy and chronic small vessel disease changes. No acute intracranial abnormality. Specifically, no hemorrhage, hydrocephalus, mass lesion, acute infarction, or significant intracranial injury. Vascular: No hyperdense vessel or unexpected calcification. Skull: No acute calvarial abnormality. Sinuses/Orbits: No acute findings. Old left medial orbital wall blowout  fracture. Other: None IMPRESSION: Atrophy, chronic microvascular disease. No acute intracranial abnormality. Electronically Signed   By: Rolm Baptise M.D.   On: 08/06/2020 15:09   CT Angio Chest PE W and/or Wo Contrast  Result Date: 08/06/2020 CLINICAL DATA:  Syncope EXAM: CT ANGIOGRAPHY CHEST WITH CONTRAST TECHNIQUE: Multidetector CT imaging of the chest was performed using the standard protocol during bolus administration of intravenous contrast. Multiplanar  CT image reconstructions and MIPs were obtained to evaluate the vascular anatomy. CONTRAST:  32mL OMNIPAQUE IOHEXOL 350 MG/ML SOLN COMPARISON:  06/20/2020 FINDINGS: Cardiovascular: Heart is normal size. Coronary artery and aortic atherosclerosis. No aneurysm. No filling defects in the pulmonary arteries to suggest pulmonary emboli. Mediastinum/Nodes: No mediastinal, hilar, or axillary adenopathy. Trachea and thyroid unremarkable. Stomach is delete that esophagus is filled with food material/debris. Lungs/Pleura: Masslike airspace opacity in the right lower lobe. Overall, airspace disease has increased since prior study. Small right pleural effusion, also increased since prior study. Left lung clear. Upper Abdomen: No acute findings. Musculoskeletal: Chest wall soft tissues are unremarkable. No acute bony abnormality. Advanced degenerative changes with large flowing anterior osteophytes in the thoracic spine. Review of the MIP images confirms the above findings. IMPRESSION: Increasing masslike airspace opacity in the right lower lobe. Previously, the central mass could be visualized, surrounded by airspace disease. Currently, the central mass canal longer be identified within the central airspace opacity. Increasing size of small right pleural effusion. Food material/debris within the esophagus could be related to dysmotility or reflux. Coronary artery disease. No evidence of pulmonary embolus. Aortic Atherosclerosis (ICD10-I70.0). Electronically Signed    By: Rolm Baptise M.D.   On: 08/06/2020 15:25   DG Chest Portable 1 View  Result Date: 08/06/2020 CLINICAL DATA:  Hypoxemia. Syncopal episode in his yard. History of RIGHT lung cancer 2011, LEFT lung cancer in 2016. Disease recurrence in May of 2020. EXAM: PORTABLE CHEST 1 VIEW COMPARISON:  08/09/2019 and CT chest on 06/20/2020 FINDINGS: The heart is enlarged and stable in configuration. Patchy airspace filling opacities in the RIGHT LOWER lobe are more opaque, suggesting infectious changes on pre-existing RIGHT LOWER lobe mass. LEFT lung is clear. No pulmonary edema. IMPRESSION: Increased airspace filling opacities in the RIGHT LOWER lobe, suggesting infectious infiltrate in addition to known RIGHT LOWER lobe mass. Electronically Signed   By: Nolon Nations M.D.   On: 08/06/2020 13:45   VAS [place] LOWER EXTREMITY VENOUS (DVT) (MC and WL 7a-7p)  Result Date: 08/06/2020  Lower Venous DVTStudy Indications: Swelling.  Comparison Study: No prior studies. Performing Technologist: Darlin Coco  Examination Guidelines: A complete evaluation includes B-mode imaging, spectral Doppler, color Doppler, and power Doppler as needed of all accessible portions of each vessel. Bilateral testing is considered an integral part of a complete examination. Limited examinations for reoccurring indications may be performed as noted. The reflux portion of the exam is performed with the patient in reverse Trendelenburg.  +-----+---------------+---------+-----------+----------+--------------+ RIGHTCompressibilityPhasicitySpontaneityPropertiesThrombus Aging +-----+---------------+---------+-----------+----------+--------------+ CFV  Full           Yes      Yes                                 +-----+---------------+---------+-----------+----------+--------------+   +---------+---------------+---------+-----------+----------+--------------+ LEFT     CompressibilityPhasicitySpontaneityPropertiesThrombus Aging  +---------+---------------+---------+-----------+----------+--------------+ CFV      Full           Yes      Yes                                 +---------+---------------+---------+-----------+----------+--------------+ SFJ      Full                                                        +---------+---------------+---------+-----------+----------+--------------+  FV Prox  Full                                                        +---------+---------------+---------+-----------+----------+--------------+ FV Mid   Full                                                        +---------+---------------+---------+-----------+----------+--------------+ FV DistalFull                                                        +---------+---------------+---------+-----------+----------+--------------+ PFV      Full                                                        +---------+---------------+---------+-----------+----------+--------------+ POP      Full           Yes      Yes                                 +---------+---------------+---------+-----------+----------+--------------+ PTV      Full                                                        +---------+---------------+---------+-----------+----------+--------------+ PERO     Full                                                        +---------+---------------+---------+-----------+----------+--------------+     Summary: RIGHT: - No evidence of common femoral vein obstruction.  LEFT: - There is no evidence of deep vein thrombosis in the lower extremity.  - No cystic structure found in the popliteal fossa.  *See table(s) above for measurements and observations. Electronically signed by Harold Barban MD on 08/06/2020 at 4:56:58 PM.    Final     Cardiac Studies   Echo pending  Assessment   1. Active Problems: 2.   Chronic diastolic CHF (congestive heart failure) ([place]) 3.   Diabetes mellitus  ([place]) 4.   HTN (hypertension) 5.   COPD II/III with reversibility  6.   Primary cancer of right lower lobe of lung ([place]) 7.   CKD (chronic kidney disease), stage III 8.   Syncope and collapse 9.   Vascular dementia without behavioral disturbance ([place]) 10.   Plan   1. Noted to have AIVR overnight - also documented atrial tachycardia in the 120's in the ER - I think it is likely these are  arrhythmogenic syncopal episodes, however, management in this patient may be challenging - given dementia and recurrent lung CA (followed by palliative, not on hospice) - he is not likely a pacemaker candidate. 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 Will d/w EP regarding their recs.  Time Spent Directly with Patient:  I have spent a total of 25 minutes with the patient reviewing hospital notes, telemetry, EKGs, labs and examining the patient as well as establishing an assessment and plan that was discussed personally with the patient.  > 50% of time was spent in direct patient care.  Length of Stay:  LOS: 1 day   Pixie Casino, MD, [facility], [place] Director of the Advanced Lipid Disorders &  Cardiovascular Risk Reduction Clinic Diplomate of the American Board of Clinical Lipidology Attending Cardiologist  Direct Dial: [phone]  Fax: [phone]  Website:  [email or web address] [email or web address] Diver 08/07/2020, 8:54 AM

## 2020-08-07 NOTE — Progress Notes (Addendum)
Floor coverage  Patient with a history of recurrent non-small cell lung cancer, heart failure with preserved ejection fraction, hypertension, hyperlipidemia, COPD, dementia admitted 8/8 for evaluation of syncope.  Per chart, patient had syncopal event while working in his yard with his son.  When EMS arrived he was found to be hypoxic to the 80s.  Initially required nonrebreather but then transition to supplemental oxygen via nasal cannula. Troponin normal and EKG without acute changes.  CT angiogram was negative for PE.  CT head negative for acute intracranial abnormality.  While in the ED patient had another syncopal event.  ED was seen by cardiology and his syncope was felt to be either due to arrhythmia or other causes such as orthostatic versus vagal versus underlying respiratory disease.  Recommendation was to continue telemetry monitoring, obtain echocardiogram, and give IV Lasix 20 mg for mild diuresis as he was felt to be volume overloaded.  Nursing staff reported that patient had another syncopal episode this morning while going to the bathroom.  He was not on supplemental oxygen at that time. He felt dizzy and SOB so they helped him sit on the bed. Patient then lost consciousness for a few seconds.  His oxygen saturation was in the 70s, improved to 94% with 3 L supplemental oxygen.  Patient was seen at bedside.  Resting comfortably and stated multiple times "leave me alone."  He refused to answer any questions and refused physical examination.  Satting 100% on 3 L supplemental oxygen, not tachycardic  Telemetry at the time of his syncopal event showing idioventricular rhythm.  Suspect patient's recurrent syncope is secondary to arrhythmia. -Continuous pulse ox, supplemental oxygen to keep oxygen saturation above 92% -Echo pending -Continue telemetry monitoring -Stat repeat EKG and troponin -Spoke to cardiology, they will review telemetry

## 2020-08-07 NOTE — Progress Notes (Signed)
Pt has syncopal episode this am while up to use toilet. Passed out for few seconds. O2 sats were in 70s. 3l O2 [place]  sats 94%.  Telemetry shows idioventricular beats during this time. Patient has history of Dementia and refuses Vital Signs and wants to "Go to Sleep!!". Patient resting quietly. On call MD Paged.

## 2020-08-07 NOTE — Progress Notes (Signed)
PROGRESS NOTE    John [name]  NOM:[id number] DOB: 12/09/1946 DOA: 08/06/2020 PCP: [name], NP   Brief Narrative:   John [name] is a 74 y.o. male with medical history significant of HTN, HLD, COPD, CHF, and squamous cell carcinoma of the right lung presents to the ED with syncope while working in the yard today.Per EMS the patient was working in the yard with his son which is not unusual. The son apparently witnessed the patient collapsed to the ground and lose consciousness.He felt mainly to his knees and there was no reported head injury or trauma. Fire arrived on scene and reported that the patient was hypoxic to the 80s.They started nonrebreather and when EMS arrived they were able to titrate to nasal cannula currently at 1 L.No witnessed seizure activity. The patient does not recall the event. He does not recall feeling badly prior to this. He states that overall his energy has been good, he has been eating well, compliant with his medications.No recent medication changes. Patient is currently followed by palliative care but not in the hospice program. Review of recent Oncology note mentions patient is on home O2 but not apparently wearing this today.  8/9: Had a few more syncopal episodes last night. EP consulted.  Assessment & Plan: Syncope and collapse     - has had multiple episodes and at least one admission for this problem.      - Neurologic etiology has been ruled out: negative EEG, neuro consult opinion.      - CT brain without acute changes or mets. Suspect cardiogenic etiology.     - Cards/EP: seeing sinsus brady and excessive vagal tone; holding BB, watcha gain ON, place 14-day monitor at discharge  Chronic HFpEF      - mild elevation of BNP. Does not appear decompensated.      - Last Echo Dec '20 with EF 60-65% and impaired relaxtion.     - rpt echo pending  DMt2      - on no medication.      - Serum glucose with mild elevation.     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      - Pt losing wt.      - He follows with Dr. Earlie Server and has been seen by [facility].     - Follow up with Dr. Earlie Server outpt.   COPD      - By records was on home O2 at one time     - CXR suggests possible infiltrate but CTA does not reveal any infiltrate.      - He was given Ceftriaxone and Azithromycin in ED but is afebrile and no leukcytosis, no cough or sputum production, no hypoxemia.     - holding further abx     - dulera, PRN albuterol     - O2 support; wean as able  Dementia      - aricept, seroquel  HLD     - atorvastatin  HTN     - norvasc  AKI     - baseline SCr 1.0 - 1.1     - Scr at admission 1.4; down to 1.2 today. Follow  DVT prophylaxis: lovenox Code Status: DNR Family Communication: Spoke with dtr by phone.    Status is: Inpatient  Remains inpatient appropriate because:Inpatient level of care appropriate due to severity of illness   Dispo: The patient  is from: Home              Anticipated d/c is to: Home              Anticipated d/c date is: 1 day              Patient currently is not medically stable to d/c.  Consultants:   Cardiology/EP   ROS:  Denies CP, N, V, ab pain, dyspnea . Remainder ROS is negative for all not previously mentioned.  Subjective: "I wear it sometimes."  Objective: Vitals:   08/07/20 0430 08/07/20 0738 08/07/20 0748 08/07/20 1016  BP: (!) 135/124 (!) 160/68  (!) 157/66  Pulse: 97 79  72  Resp: 20 15  18   Temp:  98.4 F (36.9 C)  99.7 F (37.6 C)  TempSrc: Oral Oral  Oral  SpO2: 94% 100% 100% 100%  Weight:      Height:        Intake/Output Summary (Last 24 hours) at 08/07/2020 1354 Last data filed at 08/07/2020 1322 Gross per 24 hour  Intake 830 ml  Output 200 ml  Net 630 ml   Filed Weights   08/06/20 1307 08/06/20 2116 08/07/20 0000  Weight: 73.6 kg 66.5 kg 66.5 kg    Examination:  General: 74 y.o. male resting in bed in  NAD Cardiovascular: RRR, +S1, S2, no m/g/r, equal pulses throughout Respiratory: CTABL, no w/r/r, normal WOB GI: BS+, NDNT, no masses noted, no organomegaly noted MSK: No e/c/c Neuro: Alert to name and place, follows commands Psyc: Appropriate interaction and affect, calm/cooperative   Data Reviewed: I have personally reviewed following labs and imaging studies.  CBC: Recent Labs  Lab 08/06/20 1311 08/06/20 1716  WBC 8.4  --   NEUTROABS 7.2  --   HGB 8.4* 10.9*  HCT 28.0* 32.0*  MCV 99.3  --   PLT 284  --    Basic Metabolic Panel: Recent Labs  Lab 08/06/20 1311 08/06/20 1528 08/06/20 1716 08/07/20 0732  NA 138  --  138 141  K 5.1  --  4.8 4.7  CL 97*  --  97* 99  CO2 27  --   --  33*  GLUCOSE 126*  --  123* 79  BUN 20  --  21 16  CREATININE 1.38*  --  1.40* 1.27*  CALCIUM 8.7*  --   --  8.7*  MG  --  1.6*  --  1.9   GFR: Estimated Creatinine Clearance: 43.7 mL/min (A) (by C-G formula based on SCr of 1.27 mg/dL (H)). Liver Function Tests: Recent Labs  Lab 08/06/20 1311  AST 50*  ALT 18  ALKPHOS 80  BILITOT 0.7  PROT 5.7*  ALBUMIN 2.5*   No results for input(s): LIPASE, AMYLASE in the last 168 hours. No results for input(s): AMMONIA in the last 168 hours. Coagulation Profile: No results for input(s): INR, PROTIME in the last 168 hours. Cardiac Enzymes: No results for input(s): CKTOTAL, CKMB, CKMBINDEX, TROPONINI in the last 168 hours. BNP (last 3 results) No results for input(s): PROBNP in the last 8760 hours. HbA1C: Recent Labs    08/06/20 2138  HGBA1C 4.9   CBG: Recent Labs  Lab 08/06/20 1709  GLUCAP 97   Lipid Profile: No results for input(s): CHOL, HDL, LDLCALC, TRIG, CHOLHDL, LDLDIRECT in the last 72 hours. Thyroid Function Tests: No results for input(s): TSH, T4TOTAL, FREET4, T3FREE, THYROIDAB in the last 72 hours. Anemia Panel: No results for input(s): VITAMINB12, FOLATE,  FERRITIN, TIBC, IRON, RETICCTPCT in the last 72 hours. Sepsis  Labs: Recent Labs  Lab 08/06/20 2138  PROCALCITON <0.10    Recent Results (from the past 240 hour(s))  SARS Coronavirus 2 by RT PCR (hospital order, performed in [facility] hospital lab) Nasopharyngeal Nasopharyngeal Swab     Status: None   Collection Time: 08/06/20  1:28 PM   Specimen: Nasopharyngeal Swab  Result Value Ref Range Status   SARS Coronavirus 2 NEGATIVE NEGATIVE Final    Comment: (NOTE) SARS-CoV-2 target nucleic acids are NOT DETECTED.  The SARS-CoV-2 RNA is generally detectable in upper and lower respiratory specimens during the acute phase of infection. The lowest concentration of SARS-CoV-2 viral copies this assay can detect is 250 copies / mL. A negative result does not preclude SARS-CoV-2 infection and should not be used as the sole basis for treatment or other patient management decisions.  A negative result may occur with improper specimen collection / handling, submission of specimen other than nasopharyngeal swab, presence of viral mutation(s) within the areas targeted by this assay, and inadequate number of viral copies (<250 copies / mL). A negative result must be combined with clinical observations, patient history, and epidemiological information.  Fact Sheet for Patients:   StrictlyIdeas.no  Fact Sheet for Healthcare Providers: BankingDealers.co.za  This test is not yet approved or  cleared by the [place] FDA and has been authorized for detection and/or diagnosis of SARS-CoV-2 by FDA under an Emergency Use Authorization (EUA).  This EUA will remain in effect (meaning this test can be used) for the duration of the COVID-19 declaration under Section 564(b)(1) of the Act, 21 U.S.C. section 360bbb-3(b)(1), unless the authorization is terminated or revoked sooner.  Performed at [facility], [place] [address], [place], [place] [address]   Urine culture     Status: None   Collection Time:  08/06/20  2:45 PM   Specimen: Urine, Clean Catch  Result Value Ref Range Status   Specimen Description URINE, CLEAN CATCH  Final   Special Requests NONE  Final   Culture   Final    NO GROWTH Performed at [facility], [place] [address] [place], [place] [address]    Report Status 08/07/2020 FINAL  Final  Culture, blood (routine x 2)     Status: None (Preliminary result)   Collection Time: 08/06/20  3:28 PM   Specimen: BLOOD RIGHT FOREARM  Result Value Ref Range Status   Specimen Description BLOOD RIGHT FOREARM  Final   Special Requests   Final    BOTTLES DRAWN AEROBIC AND ANAEROBIC Blood Culture results may not be optimal due to an inadequate volume of blood received in culture bottles   Culture   Final    NO GROWTH < 24 HOURS Performed at [facility], [place] [address], [place], [place] [address]    Report Status PENDING  Incomplete  Culture, blood (routine x 2)     Status: None (Preliminary result)   Collection Time: 08/06/20  3:37 PM   Specimen: BLOOD LEFT FOREARM  Result Value Ref Range Status   Specimen Description BLOOD LEFT FOREARM  Final   Special Requests   Final    BOTTLES DRAWN AEROBIC AND ANAEROBIC Blood Culture results may not be optimal due to an inadequate volume of blood received in culture bottles   Culture   Final    NO GROWTH < 24 HOURS Performed at [facility], [place] [address], [place], [place]  [address]    Report Status PENDING  Incomplete      Radiology Studies: CT Head Wo Contrast  Result Date: 08/06/2020 CLINICAL DATA:  Mental status changes EXAM: CT HEAD WITHOUT CONTRAST TECHNIQUE: Contiguous axial images were obtained from the base of the skull through the vertex without intravenous contrast. COMPARISON:  08/01/2019 FINDINGS: Brain: There is atrophy and chronic small vessel disease changes. No acute intracranial abnormality. Specifically, no hemorrhage, hydrocephalus, mass lesion, acute infarction, or significant intracranial injury.  Vascular: No hyperdense vessel or unexpected calcification. Skull: No acute calvarial abnormality. Sinuses/Orbits: No acute findings. Old left medial orbital wall blowout fracture. Other: None IMPRESSION: Atrophy, chronic microvascular disease. No acute intracranial abnormality. Electronically Signed   By: Rolm Baptise M.D.   On: 08/06/2020 15:09   CT Angio Chest PE W and/or Wo Contrast  Result Date: 08/06/2020 CLINICAL DATA:  Syncope EXAM: CT ANGIOGRAPHY CHEST WITH CONTRAST TECHNIQUE: Multidetector CT imaging of the chest was performed using the standard protocol during bolus administration of intravenous contrast. Multiplanar CT image reconstructions and MIPs were obtained to evaluate the vascular anatomy. CONTRAST:  17mL OMNIPAQUE IOHEXOL 350 MG/ML SOLN COMPARISON:  06/20/2020 FINDINGS: Cardiovascular: Heart is normal size. Coronary artery and aortic atherosclerosis. No aneurysm. No filling defects in the pulmonary arteries to suggest pulmonary emboli. Mediastinum/Nodes: No mediastinal, hilar, or axillary adenopathy. Trachea and thyroid unremarkable. Stomach is delete that esophagus is filled with food material/debris. Lungs/Pleura: Masslike airspace opacity in the right lower lobe. Overall, airspace disease has increased since prior study. Small right pleural effusion, also increased since prior study. Left lung clear. Upper Abdomen: No acute findings. Musculoskeletal: Chest wall soft tissues are unremarkable. No acute bony abnormality. Advanced degenerative changes with large flowing anterior osteophytes in the thoracic spine. Review of the MIP images confirms the above findings. IMPRESSION: Increasing masslike airspace opacity in the right lower lobe. Previously, the central mass could be visualized, surrounded by airspace disease. Currently, the central mass canal longer be identified within the central airspace opacity. Increasing size of small right pleural effusion. Food material/debris within the  esophagus could be related to dysmotility or reflux. Coronary artery disease. No evidence of pulmonary embolus. Aortic Atherosclerosis (ICD10-I70.0). Electronically Signed   By: Rolm Baptise M.D.   On: 08/06/2020 15:25   DG Chest Portable 1 View  Result Date: 08/06/2020 CLINICAL DATA:  Hypoxemia. Syncopal episode in his yard. History of RIGHT lung cancer 2011, LEFT lung cancer in 2016. Disease recurrence in May of 2020. EXAM: PORTABLE CHEST 1 VIEW COMPARISON:  08/09/2019 and CT chest on 06/20/2020 FINDINGS: The heart is enlarged and stable in configuration. Patchy airspace filling opacities in the RIGHT LOWER lobe are more opaque, suggesting infectious changes on pre-existing RIGHT LOWER lobe mass. LEFT lung is clear. No pulmonary edema. IMPRESSION: Increased airspace filling opacities in the RIGHT LOWER lobe, suggesting infectious infiltrate in addition to known RIGHT LOWER lobe mass. Electronically Signed   By: Nolon Nations M.D.   On: 08/06/2020 13:45   VAS [place] LOWER EXTREMITY VENOUS (DVT) (MC and WL 7a-7p)  Result Date: 08/06/2020  Lower Venous DVTStudy Indications: Swelling.  Comparison Study: No prior studies. Performing Technologist: Darlin Coco  Examination Guidelines: A complete evaluation includes B-mode imaging, spectral Doppler, color Doppler, and power Doppler as needed of all accessible portions of each vessel. Bilateral testing is considered an integral part of a complete examination. Limited examinations for reoccurring indications may be performed as noted. The reflux portion of the exam is performed with the patient  in reverse Trendelenburg.  +-----+---------------+---------+-----------+----------+--------------+  RIGHT Compressibility Phasicity Spontaneity Properties Thrombus Aging  +-----+---------------+---------+-----------+----------+--------------+  CFV   Full            Yes       Yes                                     +-----+---------------+---------+-----------+----------+--------------+   +---------+---------------+---------+-----------+----------+--------------+  LEFT      Compressibility Phasicity Spontaneity Properties Thrombus Aging  +---------+---------------+---------+-----------+----------+--------------+  CFV       Full            Yes       Yes                                    +---------+---------------+---------+-----------+----------+--------------+  SFJ       Full                                                             +---------+---------------+---------+-----------+----------+--------------+  FV Prox   Full                                                             +---------+---------------+---------+-----------+----------+--------------+  FV Mid    Full                                                             +---------+---------------+---------+-----------+----------+--------------+  FV Distal Full                                                             +---------+---------------+---------+-----------+----------+--------------+  PFV       Full                                                             +---------+---------------+---------+-----------+----------+--------------+  POP       Full            Yes       Yes                                    +---------+---------------+---------+-----------+----------+--------------+  PTV       Full                                                             +---------+---------------+---------+-----------+----------+--------------+  PERO      Full                                                             +---------+---------------+---------+-----------+----------+--------------+     Summary: RIGHT: - No evidence of common femoral vein obstruction.  LEFT: - There is no evidence of deep vein thrombosis in the lower extremity.  - No cystic structure found in the popliteal fossa.  *See table(s) above for measurements and observations. Electronically signed  by Harold Barban MD on 08/06/2020 at 4:56:58 PM.    Final      Scheduled Meds:  allopurinol  300 mg Oral Daily   amLODipine  2.5 mg Oral Daily   atorvastatin  20 mg Oral Daily   cholecalciferol  2,000 Units Oral Daily   donepezil  5 mg Oral QHS   enoxaparin (LOVENOX) injection  40 mg Subcutaneous Q24H   ferrous sulfate  325 mg Oral Q M,W,F   magnesium oxide  400 mg Oral BID   megestrol  400 mg Oral Daily   mometasone-formoterol  2 puff Inhalation BID   multivitamin with minerals  1 tablet Oral Daily   pantoprazole  40 mg Oral BID AC   potassium chloride SA  20 mEq Oral BID   QUEtiapine  25 mg Oral BID   senna-docusate  1 tablet Oral QHS   sodium chloride flush  3 mL Intravenous Q12H   sucralfate  1 g Oral QID   cyanocobalamin  1,000 mcg Oral Daily   Continuous Infusions:  sodium chloride       LOS: 1 day    Time spent: 25 minutes spent in the coordination of care today.    Jonnie Finner, DO [facility]  If 7PM-7AM, please contact night-coverage [email or web address] 08/07/2020, 1:54 PM

## 2020-08-07 NOTE — Consult Note (Addendum)
ELECTROPHYSIOLOGY CONSULT NOTE    Patient ID: John [name] MRN: [id number], DOB/AGE: 05/25/1946 74 y.o.  Admit date: 08/06/2020 Date of Consult: 08/07/2020  Primary Physician: [name], NP Primary Cardiologist: No primary care provider on file.  Electrophysiologist: New  Referring Provider: Dr. Debara Pickett  Patient Profile: John [name] is a 74 y.o. male with a history of non-small cell lung cancer (most recent SBRT (radiation) completed 09/03/2019), h/o HFpEF, HTN, HLD, COPD, and dementia who is being seen today for the evaluation of syncope at the request of Dr. Debara Pickett.  HPI:  John [name] is a 74 y.o. male with medical history as above.   History mostly obtained from available records as patient is not able to recall pertinent details. Per EMS report, John [name] was working in the yard with his son when his son witnessed the patient collapse to the ground and momentarily lose consciousness; there was no witnessed seizure activity.EMS arrived on scene and found patient to be hypoxic to the 80s. He was subsequently started on a nonrebreather and by the time of arrival to the ED they were able to titrate to  1 L nasal cannula. In the ED, patient was afebrile, with BP 143/68, HR 91, RR 22 O2 sat 95%. Initial labs showed a normal K, Mg of 1.6, normal serum glucose, BNP of 209.9, Hgb 8.4 (9.8 two months ago). Troponin was wnl and EKG showed NSR. CT Angio chest was negative for PE, CT head showed NAICP, and CXR showed findings concerning for infectious infiltrate.   Pt was planned for discharge from ED when he had further syncope and fell to the floor "after" using the restroom, pt was NOT connected to tele at the time. Initial ECG a few minutes after showed an atrial tachycardia at 128 bpm. Second ECG showed sinus tach at 116 with multifocal PVCs and thus patient was admitted for further observation.   Pt noted to have a slow wide-complex rhythm at 43 beats overnight, thus EP asked  to see for further recommends for treatment vs monitoring.   Pt with dementia at baseline.   Pt states he had bowel movement prior to syncopal episode in ED, but otherwise is unable to give me further details of the event. He denies other syncope prior to these events. He denies any type of chest pain, fever, or chills. He states he is on O2 at baseline. He states he has occasional heart racing when asked, but again can provide no additional details.    Pt tells me he lives at home alone. Nephew states his daughter lives with him.   Per nephew they were out in the yard working. Pt was NOT wearing his prescribed home O2. He came out of the house, walked to the trailer talking about his weedeater and it's warranty and went to get it.  He carried it out, showed it to his nephew, then went to sit it on the porch. He "sat the weedeater down on the porch" and nephew "turned around" and he was lying there. Pt with abrasion to knees but reported know other injury. Noted to be lethargic and "mumbling" and confused + incontinent of urine per FD assessment. Pt also noted to have O2 destaruation into 80s (possibly lower but EMS run sheet unable to be enlarged at this time) requiring NRB.   EMS EKG on arrival showed NSR at 90 bpm  Past Medical History:  Diagnosis Date  . B12 deficiency   . CHF (congestive heart  failure) ([place])   . COPD (chronic obstructive pulmonary disease) ([place])   . Diabetes mellitus without complication ([place])   . Folate deficiency   . Gout   . Hyperlipemia   . Hypertension   . 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 MI (myocardial infarction) ([place])   . Pulmonary hypertension ([place])      Surgical History:  Past Surgical History:  Procedure Laterality Date  . APPENDECTOMY    . BIOPSY  04/21/2019   Procedure: BIOPSY;  Surgeon: Gatha Mayer, MD;  Location: [facility];  Service: Endoscopy;;  . ESOPHAGOGASTRODUODENOSCOPY (EGD) WITH  PROPOFOL N/A 04/21/2019   Procedure: ESOPHAGOGASTRODUODENOSCOPY (EGD) WITH PROPOFOL;  Surgeon: Gatha Mayer, MD;  Location: [facility];  Service: Endoscopy;  Laterality: N/A;  . ESOPHAGOGASTRODUODENOSCOPY (EGD) WITH PROPOFOL N/A 04/29/2019   Procedure: ESOPHAGOGASTRODUODENOSCOPY (EGD) WITH PROPOFOL;  Surgeon: Mauri Pole, MD;  Location: [facility];  Service: Endoscopy;  Laterality: N/A;  . HEMOSTASIS CLIP PLACEMENT  04/29/2019   Procedure: HEMOSTASIS CLIP PLACEMENT;  Surgeon: Mauri Pole, MD;  Location: [facility];  Service: Endoscopy;;  Clip placed as marker not for bleeding control  . IR ANGIOGRAM SELECTIVE EACH ADDITIONAL VESSEL  04/29/2019  . IR ANGIOGRAM SELECTIVE EACH ADDITIONAL VESSEL  04/29/2019  . IR ANGIOGRAM SELECTIVE EACH ADDITIONAL VESSEL  04/29/2019  . IR ANGIOGRAM VISCERAL SELECTIVE  04/29/2019  . IR EMBO ART  VEN HEMORR LYMPH EXTRAV  INC GUIDE ROADMAPPING  04/29/2019  . IR US GUIDE VASC ACCESS RIGHT  04/29/2019  . LUNG BIOPSY       Medications Prior to Admission  Medication Sig Dispense Refill Last Dose  . acetaminophen (TYLENOL) 325 MG tablet Take 2 tablets (650 mg total) by mouth every 6 (six) hours as needed for mild pain (or Fever >/= 101).   Past Week at Unknown time  . allopurinol (ZYLOPRIM) 300 MG tablet Take 300 mg by mouth daily.   08/06/2020 at Unknown time  . amLODipine (NORVASC) 10 MG tablet Take 10 mg by mouth daily.   08/06/2020 at Unknown time  . amLODipine (NORVASC) 2.5 MG tablet Take 1 tablet (2.5 mg total) by mouth daily. 30 tablet 3 08/06/2020 at Unknown time  . atorvastatin (LIPITOR) 20 MG tablet Take 1 tablet (20 mg total) by mouth daily. 30 tablet 0 08/06/2020 at Unknown time  . bisoprolol (ZEBETA) 5 MG tablet TAKE ONE TABLET BY MOUTH DAILY (Patient taking differently: Take 5 mg by mouth daily. ) 31 tablet 2 08/06/2020 at Unknown time  . donepezil (ARICEPT) 10 MG tablet Take 1/2 tablet daily for 2 weeks, then increase to 1 tablet daily (Patient  taking differently: Take 10 mg by mouth at bedtime. ) 30 tablet 11 08/06/2020 at Unknown time  . Multiple Vitamin (MULTIVITAMIN WITH MINERALS) TABS tablet Take 1 tablet by mouth daily. Over the counter 30 tablet 3 08/06/2020 at Unknown time  . pantoprazole (PROTONIX) 40 MG tablet Take 1 tablet (40 mg total) by mouth 2 (two) times daily before a meal. 180 tablet 3 08/06/2020 at Unknown time  . senna-docusate (SENOKOT-S) 8.6-50 MG tablet Take 1 tablet by mouth at bedtime. (Patient taking differently: Take 2 tablets by mouth at bedtime. ) 30 tablet 0 08/06/2020 at Unknown time  . vitamin B-12 1000 MCG tablet Take 1 tablet (1,000 mcg total) by mouth daily. 30 tablet 3 08/06/2020 at Unknown time  . albuterol (PROAIR HFA) 108 (90 BASE) MCG/ACT inhaler INHALE 2  PUFFS BY MOUTH EVERY 4 HOURS AS NEEDED FOR WHEEZING (Patient not taking: Reported on 08/07/2020) 1 Inhaler 11 Not Taking at Unknown time  . budesonide-formoterol (SYMBICORT) 160-4.5 MCG/ACT inhaler Inhale 2 puffs into the lungs 2 (two) times daily.     . Cholecalciferol (VITAMIN D3) 50 MCG (2000 UT) capsule Take 2,000 Units by mouth daily.      . cloNIDine (CATAPRES) 0.1 MG tablet Take 0.1 mg by mouth 2 (two) times daily.     . ferrous sulfate 325 (65 FE) MG tablet Take 1 tablet (325 mg total) by mouth every Monday, Wednesday, and Friday. (Patient not taking: Reported on 08/07/2020) 30 tablet 2 Not Taking at Unknown time  . folic acid (FOLVITE) 1 MG tablet Take 1 mg by mouth daily.     . Magnesium Oxide 200 MG TABS Take 1 tablet (200 mg total) by mouth 2 (two) times daily. (Patient not taking: Reported on 08/07/2020) 60 tablet 0 Not Taking at Unknown time  . Magnesium Oxide 250 MG TABS Take 1 tablet by mouth daily.     . megestrol (MEGACE) 400 MG/10ML suspension Take 10 mLs (400 mg total) by mouth daily. (Patient not taking: Reported on 08/07/2020) 240 mL 1 Not Taking at Unknown time  . metFORMIN (GLUCOPHAGE) 1000 MG tablet Take 1,000 mg by mouth 2 (two) times daily.      . potassium chloride SA (K-DUR) 20 MEQ tablet Take 1 tablet (20 mEq total) by mouth 2 (two) times daily. (Patient not taking: Reported on 08/07/2020) 14 tablet 0 Not Taking at Unknown time  . QUEtiapine (SEROQUEL) 25 MG tablet Take 1 tablet (25 mg total) by mouth 2 (two) times daily. (Patient not taking: Reported on 08/07/2020) 60 tablet 0 Not Taking at Unknown time  . sucralfate (CARAFATE) 1 g tablet Take 1 g by mouth 4 (four) times daily.      . traMADol (ULTRAM) 50 MG tablet Take 1 tablet (50 mg total) by mouth every 6 (six) hours as needed for moderate pain. (Patient not taking: Reported on 08/07/2020) 15 tablet 0 Not Taking at Unknown time  . Vitamin D, Ergocalciferol, (DRISDOL) 1.25 MG (50000 UT) CAPS capsule Take 50,000 Units by mouth every Friday.        Inpatient Medications:  . allopurinol  300 mg Oral Daily  . amLODipine  2.5 mg Oral Daily  . atorvastatin  20 mg Oral Daily  . bisoprolol  5 mg Oral Daily  . cholecalciferol  2,000 Units Oral Daily  . donepezil  5 mg Oral QHS  . enoxaparin (LOVENOX) injection  40 mg Subcutaneous Q24H  . ferrous sulfate  325 mg Oral Q M,W,F  . magnesium oxide  400 mg Oral BID  . megestrol  400 mg Oral Daily  . mometasone-formoterol  2 puff Inhalation BID  . multivitamin with minerals  1 tablet Oral Daily  . pantoprazole  40 mg Oral BID AC  . potassium chloride SA  20 mEq Oral BID  . QUEtiapine  25 mg Oral BID  . senna-docusate  1 tablet Oral QHS  . sodium chloride flush  3 mL Intravenous Q12H  . sucralfate  1 g Oral QID  . cyanocobalamin  1,000 mcg Oral Daily    Allergies:  Allergies  Allergen Reactions  . Levaquin [Levofloxacin In D5w] Other (See Comments)    Unknown reaction  . Lisinopril Swelling    Facial swelling    Social History   Socioeconomic History  . Marital status: Legally Separated  Spouse name: Not on file  . Number of children: 2  . Years of education: Not on file  . Highest education level: Not on file  Occupational  History  . Occupation: retired  Tobacco Use  . Smoking status: Former Smoker    Packs/day: 1.50    Years: 60.00    Pack years: 90.00    Types: Cigarettes    Quit date: 09/29/2012    Years since quitting: 7.8  . Smokeless tobacco: Never Used  Vaping Use  . Vaping Use: Never used  Substance and Sexual Activity  . Alcohol use: No    Alcohol/week: 0.0 standard drinks  . Drug use: No  . Sexual activity: Yes  Other Topics Concern  . Not on file  Social History Narrative   Separated - 1 daughter and 1 son   Retired Horticulturist, commercial   Former smoker, no EtOH   Right handed    Social Determinants of Health   Financial Resource Strain:   . Difficulty of Paying Living Expenses:   Food Insecurity:   . Worried About Charity fundraiser in the Last Year:   . Arboriculturist in the Last Year:   Transportation Needs:   . Film/video editor (Medical):   Marland Kitchen Lack of Transportation (Non-Medical):   Physical Activity:   . Days of Exercise per Week:   . Minutes of Exercise per Session:   Stress:   . Feeling of Stress :   Social Connections:   . Frequency of Communication with Friends and Family:   . Frequency of Social Gatherings with Friends and Family:   . Attends Religious Services:   . Active Member of Clubs or Organizations:   . Attends Archivist Meetings:   Marland Kitchen Marital Status:   Intimate Partner Violence:   . Fear of Current or Ex-Partner:   . Emotionally Abused:   Marland Kitchen Physically Abused:   . Sexually Abused:      Family History  Problem Relation Age of Onset  . Heart disease Mother   . Cancer Neg Hx      Review of Systems: All other systems reviewed and are otherwise negative except as noted above.  Physical Exam: Vitals:   08/07/20 0000 08/07/20 0430 08/07/20 0738 08/07/20 0748  BP:  (!) 135/124 (!) 160/68   Pulse:  97 79   Resp:  20 15   Temp:   98.4 F (36.9 C)   TempSrc:  Oral Oral   SpO2:  94% 100% 100%  Weight: 66.5 kg     Height:        GEN- The  patient is well appearing, alert and oriented x 3 today.   HEENT: normocephalic, atraumatic; sclera clear, conjunctiva pink; hearing intact; oropharynx clear; neck supple Lungs- Clear to ausculation bilaterally, normal work of breathing.  No wheezes, rales, rhonchi Heart- Regular rate and rhythm, no murmurs, rubs or gallops GI- soft, non-tender, non-distended, bowel sounds present Extremities- no clubbing, cyanosis, or edema; DP/PT/radial pulses 2+ bilaterally MS- no significant deformity or atrophy Skin- warm and dry, no rash or lesion Psych- euthymic mood, full affect Neuro- strength and sensation are intact  Labs:   Lab Results  Component Value Date   WBC 8.4 08/06/2020   HGB 10.9 (L) 08/06/2020   HCT 32.0 (L) 08/06/2020   MCV 99.3 08/06/2020   PLT 284 08/06/2020    Recent Labs  Lab 08/06/20 1311 08/06/20 1716 08/07/20 0732  NA 138   < >  141  K 5.1   < > 4.7  CL 97*   < > 99  CO2 27  --  33*  BUN 20   < > 16  CREATININE 1.38*   < > 1.27*  CALCIUM 8.7*  --  8.7*  PROT 5.7*  --   --   BILITOT 0.7  --   --   ALKPHOS 80  --   --   ALT 18  --   --   AST 50*  --   --   GLUCOSE 126*   < > 79   < > = values in this interval not displayed.      Radiology/Studies: CT Head Wo Contrast  Result Date: 08/06/2020 CLINICAL DATA:  Mental status changes EXAM: CT HEAD WITHOUT CONTRAST TECHNIQUE: Contiguous axial images were obtained from the base of the skull through the vertex without intravenous contrast. COMPARISON:  08/01/2019 FINDINGS: Brain: There is atrophy and chronic small vessel disease changes. No acute intracranial abnormality. Specifically, no hemorrhage, hydrocephalus, mass lesion, acute infarction, or significant intracranial injury. Vascular: No hyperdense vessel or unexpected calcification. Skull: No acute calvarial abnormality. Sinuses/Orbits: No acute findings. Old left medial orbital wall blowout fracture. Other: None IMPRESSION: Atrophy, chronic microvascular disease.  No acute intracranial abnormality. Electronically Signed   By: Rolm Baptise M.D.   On: 08/06/2020 15:09   CT Angio Chest PE W and/or Wo Contrast  Result Date: 08/06/2020 CLINICAL DATA:  Syncope EXAM: CT ANGIOGRAPHY CHEST WITH CONTRAST TECHNIQUE: Multidetector CT imaging of the chest was performed using the standard protocol during bolus administration of intravenous contrast. Multiplanar CT image reconstructions and MIPs were obtained to evaluate the vascular anatomy. CONTRAST:  86mL OMNIPAQUE IOHEXOL 350 MG/ML SOLN COMPARISON:  06/20/2020 FINDINGS: Cardiovascular: Heart is normal size. Coronary artery and aortic atherosclerosis. No aneurysm. No filling defects in the pulmonary arteries to suggest pulmonary emboli. Mediastinum/Nodes: No mediastinal, hilar, or axillary adenopathy. Trachea and thyroid unremarkable. Stomach is delete that esophagus is filled with food material/debris. Lungs/Pleura: Masslike airspace opacity in the right lower lobe. Overall, airspace disease has increased since prior study. Small right pleural effusion, also increased since prior study. Left lung clear. Upper Abdomen: No acute findings. Musculoskeletal: Chest wall soft tissues are unremarkable. No acute bony abnormality. Advanced degenerative changes with large flowing anterior osteophytes in the thoracic spine. Review of the MIP images confirms the above findings. IMPRESSION: Increasing masslike airspace opacity in the right lower lobe. Previously, the central mass could be visualized, surrounded by airspace disease. Currently, the central mass canal longer be identified within the central airspace opacity. Increasing size of small right pleural effusion. Food material/debris within the esophagus could be related to dysmotility or reflux. Coronary artery disease. No evidence of pulmonary embolus. Aortic Atherosclerosis (ICD10-I70.0). Electronically Signed   By: Rolm Baptise M.D.   On: 08/06/2020 15:25   DG Chest Portable 1  View  Result Date: 08/06/2020 CLINICAL DATA:  Hypoxemia. Syncopal episode in his yard. History of RIGHT lung cancer 2011, LEFT lung cancer in 2016. Disease recurrence in May of 2020. EXAM: PORTABLE CHEST 1 VIEW COMPARISON:  08/09/2019 and CT chest on 06/20/2020 FINDINGS: The heart is enlarged and stable in configuration. Patchy airspace filling opacities in the RIGHT LOWER lobe are more opaque, suggesting infectious changes on pre-existing RIGHT LOWER lobe mass. LEFT lung is clear. No pulmonary edema. IMPRESSION: Increased airspace filling opacities in the RIGHT LOWER lobe, suggesting infectious infiltrate in addition to known RIGHT LOWER lobe mass. Electronically Signed  By: Nolon Nations M.D.   On: 08/06/2020 13:45   VAS [place] LOWER EXTREMITY VENOUS (DVT) (MC and WL 7a-7p)  Result Date: 08/06/2020  Lower Venous DVTStudy Indications: Swelling.  Comparison Study: No prior studies. Performing Technologist: Darlin Coco  Examination Guidelines: A complete evaluation includes B-mode imaging, spectral Doppler, color Doppler, and power Doppler as needed of all accessible portions of each vessel. Bilateral testing is considered an integral part of a complete examination. Limited examinations for reoccurring indications may be performed as noted. The reflux portion of the exam is performed with the patient in reverse Trendelenburg.  +-----+---------------+---------+-----------+----------+--------------+ RIGHTCompressibilityPhasicitySpontaneityPropertiesThrombus Aging +-----+---------------+---------+-----------+----------+--------------+ CFV  Full           Yes      Yes                                 +-----+---------------+---------+-----------+----------+--------------+   +---------+---------------+---------+-----------+----------+--------------+ LEFT     CompressibilityPhasicitySpontaneityPropertiesThrombus Aging +---------+---------------+---------+-----------+----------+--------------+ CFV       Full           Yes      Yes                                 +---------+---------------+---------+-----------+----------+--------------+ SFJ      Full                                                        +---------+---------------+---------+-----------+----------+--------------+ FV Prox  Full                                                        +---------+---------------+---------+-----------+----------+--------------+ FV Mid   Full                                                        +---------+---------------+---------+-----------+----------+--------------+ FV DistalFull                                                        +---------+---------------+---------+-----------+----------+--------------+ PFV      Full                                                        +---------+---------------+---------+-----------+----------+--------------+ POP      Full           Yes      Yes                                 +---------+---------------+---------+-----------+----------+--------------+ PTV      Full                                                        +---------+---------------+---------+-----------+----------+--------------+  PERO     Full                                                        +---------+---------------+---------+-----------+----------+--------------+     Summary: RIGHT: - No evidence of common femoral vein obstruction.  LEFT: - There is no evidence of deep vein thrombosis in the lower extremity.  - No cystic structure found in the popliteal fossa.  *See table(s) above for measurements and observations. Electronically signed by Harold Barban MD on 08/06/2020 at 4:56:58 PM.    Final    EKG: on arrival showed NSR at 73 bpm, with narrow QRS (89 ms) and normal PR interval (personally reviewed) F/u EKG shows AT at 128 pm -> f/u 116 with PVCs  TELEMETRY: NSR. Overnight had sinus brady in 50s that seemed to changed to a junctional  rhythm in the 40s with wide QRS, then spontaneously returned to NSR. (personally reviewed)  Assessment/Plan: 1.  Syncope Unclear circumstances other than history above. Pt was outside in the heat for initial episode. Per nephew pt was NOT wearing his prescribe home oxygen and yard work included carrying a weed eater. He was in NSR on EMS arrival. Low 02 saturation noted on FD arrival, corrected with NRB.  Syncope in ED was preceded by visit to bathroom. Pt states he had a BM in the bathroom, but at this point, his baseline mentation status is unclear.  He has a distant history of syncope in 04/08/2019 in the setting of anemia (Hgb 5.89 at that time with + FOBT)  Hgb 8.4 -> 10.9 recheck. This appears stable.  PCT negative, UA negative, BCx NGTD, HS troponin negative.  Previous EF normal. Repeat pending.   2. Atrial tachycardia No further overnight. Atrial tachycardia in 120s unlikely to cause syncope. No daytime bradycardia noted thus far on tele.  We will hold bisoprolol for now.   3. Slow, wide complex - ? Junctional rhythm 16 beats overnight then spontaneously back to sinus brady/NSR.  Asymptomatic, reportedly.   No current clear indication for device placement.   Dr. Arlan Organ. Quentin Ore to see for further. Per our discussion thus far, would continue observation at this time. As patient was working outside without his O2, would not lean towards cardiac monitoring at this time. Recommend pt wear O2 at all times as prescribed.  If recurs off BB and wearing O2 as prescribed, could consider further monitoring (?loop) at that time.   For questions or updates, please contact [place] Please consult [email or web address] for contact info under Cardiology/STEMI.  Jacalyn Lefevre, PA-C  08/07/2020 9:30 AM

## 2020-08-07 NOTE — Progress Notes (Signed)
  Echocardiogram 2D Echocardiogram has been performed.  [name] 08/07/2020, 11:38 AM

## 2020-08-07 NOTE — Progress Notes (Signed)
Attempted to call daugther to provide update on the patient. Unable to speak to daughter. Will try again.

## 2020-08-08 DIAGNOSIS — I495 Sick sinus syndrome: Secondary | ICD-10-CM

## 2020-08-08 LAB — RENAL FUNCTION PANEL
Albumin: 2.6 g/dL — ABNORMAL LOW (ref 3.5–5.0)
Anion gap: 9 (ref 5–15)
BUN: 14 mg/dL (ref 8–23)
CO2: 33 mmol/L — ABNORMAL HIGH (ref 22–32)
Calcium: 8.8 mg/dL — ABNORMAL LOW (ref 8.9–10.3)
Chloride: 100 mmol/L (ref 98–111)
Creatinine, Ser: 1.15 mg/dL (ref 0.61–1.24)
GFR calc Af Amer: >60 mL/min (ref >60)
GFR calc non Af Amer: >60 mL/min (ref >60)
Glucose, Bld: 102 mg/dL — ABNORMAL HIGH (ref 70–99)
Phosphorus: 3.4 mg/dL (ref 2.5–4.6)
Potassium: 4.6 mmol/L (ref 3.5–5.1)
Sodium: 142 mmol/L (ref 135–145)

## 2020-08-08 LAB — CBC WITH DIFFERENTIAL/PLATELET
Abs Immature Granulocytes: 0.03 10*3/uL (ref 0.00–0.07)
Basophils Absolute: 0 10*3/uL (ref 0.0–0.1)
Basophils Relative: 0 %
Eosinophils Absolute: 0.3 10*3/uL (ref 0.0–0.5)
Eosinophils Relative: 3 %
HCT: 28.2 % — ABNORMAL LOW (ref 39.0–52.0)
Hemoglobin: 8.5 g/dL — ABNORMAL LOW (ref 13.0–17.0)
Immature Granulocytes: 0 %
Lymphocytes Relative: 5 %
Lymphs Abs: 0.4 10*3/uL — ABNORMAL LOW (ref 0.7–4.0)
MCH: 30.1 pg (ref 26.0–34.0)
MCHC: 30.1 g/dL (ref 30.0–36.0)
MCV: 100 fL (ref 80.0–100.0)
Monocytes Absolute: 0.7 10*3/uL (ref 0.1–1.0)
Monocytes Relative: 9 %
Neutro Abs: 6.2 10*3/uL (ref 1.7–7.7)
Neutrophils Relative %: 83 %
Platelets: 277 10*3/uL (ref 150–400)
RBC: 2.82 MIL/uL — ABNORMAL LOW (ref 4.22–5.81)
RDW: 17.6 % — ABNORMAL HIGH (ref 11.5–15.5)
WBC: 7.6 10*3/uL (ref 4.0–10.5)
nRBC: 0 % (ref 0.0–0.2)

## 2020-08-08 MED ORDER — LORAZEPAM 2 MG/ML IJ SOLN
1.0000 mg | Freq: Once | INTRAMUSCULAR | Status: AC | PRN
  Administered 2020-08-08: 1 mg via INTRAVENOUS
  Filled 2020-08-08: qty 1

## 2020-08-08 NOTE — Progress Notes (Addendum)
PROGRESS NOTE    John [name]  VEL:[id number] DOB: July 27, 1946 DOA: 08/06/2020 PCP: [name], NP   Brief Narrative:   John Banana Mooreis a 74 y.o.malewith medical history significant ofHTN, HLD, COPD, CHF, and squamous cell carcinoma of the right lung presents to the ED with syncope while working in the yard today.Per EMS the patient was working in the yard with his son which is not unusual. The son apparently witnessed the patient collapsed to the ground and lose consciousness.He felt mainly to his knees and there was no reported head injury or trauma. Fire arrived on scene and reported that the patient was hypoxic to the 80s.They started nonrebreather and when EMS arrived they were able to titrate to nasal cannula currently at 1 L.No witnessed seizure activity. The patient does not recall the event. He does not recall feeling badly prior to this. He states that overall his energy has been good, he has been eating well, compliant with his medications.No recent medication changes. Patient is currently followed by palliative care but not in the hospice program. Review of recent Oncology note mentions patient is on home O2 but not apparently wearing this today.  8/10: He has a lot of upper airway transmission w/ weak cough today. Nursing reports coughing with drinking. Will get SLP to review. EP to place Zio patch at discharge. Continue holding BB.   Assessment & Plan:  Syncope and collapse     - has had multiple episodes and at least one admission for this problem.      - Neurologic etiology has been ruled out: negative EEG, neuro consult opinion.      - CT brain without acute changes or mets. Suspect cardiogenic etiology.     - Cards/EP: seeing sinsus brady and excessive vagal tone; holding BB      - no further syncopal episodes; patient to continue holding BB, place zio patch at discharge; appreciate cards/EP assistance  Cough     - ?dyspahgia; coughing w/  drinking/eating per nursing     - weak cough and fair amount of upper airway transmission today     - NPO, except meds     - SLP consult  Chronic HFpEF      - mild elevation of BNP. Does not appear decompensated.      - Last Echo Dec '20 with EF 60-65% and impaired relaxtion.     - Echo 8/9: Left ventricular ejection fraction, by estimation, is 60 to 65%. The left ventricle has normal function. The left ventricle has no regional wall motion abnormalities. Left ventricular diastolic parameters are consistent with Grade I diastolic dysfunction (impaired relaxation).  DMt2      - on no medication.      - Serum glucose with mild elevation.     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      - Pt losing wt.      - He follows with Dr. Earlie Server and has been seen by [facility].     - Follow up with Dr. Earlie Server outpt.   COPD      - By records was on home O2 at one time     - CXR suggests possible infiltrate but CTA does not reveal any infiltrate.      - He was given Ceftriaxone and Azithromycin in ED but is afebrile and no leukcytosis, no cough or sputum production, no hypoxemia.     -  holding further abx     - dulera, PRN albuterol     - O2 support; wean as able  Dementia      - aricept, seroquel  HLD     - atorvastatin  HTN     - norvasc  AKI     - baseline SCr 1.0 - 1.1     - Scr at admission 1.4; this is now resolved.  DVT prophylaxis: lovenox Code Status: DNR Family Communication: Spoke with dtr by phone.    Status is: Inpatient  Remains inpatient appropriate because:Inpatient level of care appropriate due to severity of illness   Dispo: The patient is from: Home              Anticipated d/c is to: Home              Anticipated d/c date is: 1 day              Patient currently is not medically stable to d/c.  Consultants:   Cardiology/EP   ROS:  Denies CP, N, V, dyspnea. Remainder ROS is negative for all not previously  mentioned.  Subjective: "I don't think I can."  Objective: Vitals:   08/08/20 0250 08/08/20 0256 08/08/20 0931 08/08/20 1143  BP: (!) 150/76   (!) 128/59  Pulse: 87   79  Resp: 17   16  Temp: 98.5 F (36.9 C)   98.6 F (37 C)  TempSrc: Oral   Oral  SpO2: 100%  98% 98%  Weight:  65.2 kg    Height:        Intake/Output Summary (Last 24 hours) at 08/08/2020 1349 Last data filed at 08/08/2020 1300 Gross per 24 hour  Intake 720 ml  Output 200 ml  Net 520 ml   Filed Weights   08/06/20 2116 08/07/20 0000 08/08/20 0256  Weight: 66.5 kg 66.5 kg 65.2 kg    Examination:  General: 74 y.o. male resting in bed in NAD Cardiovascular: RRR, +S1, S2, no m/g/r, equal pulses throughout Respiratory: Upper airway transmission; clear at bases normal WOB on 4L [place] GI: BS+, NDNT, no masses noted MSK: No e/c/c Neuro: Alert to name, follows commands Psyc: calm/cooperative   Data Reviewed: I have personally reviewed following labs and imaging studies.  CBC: Recent Labs  Lab 08/06/20 1311 08/06/20 1716 08/08/20 1122  WBC 8.4  --  7.6  NEUTROABS 7.2  --  6.2  HGB 8.4* 10.9* 8.5*  HCT 28.0* 32.0* 28.2*  MCV 99.3  --  100.0  PLT 284  --  379   Basic Metabolic Panel: Recent Labs  Lab 08/06/20 1311 08/06/20 1528 08/06/20 1716 08/07/20 0732 08/08/20 1122  NA 138  --  138 141 142  K 5.1  --  4.8 4.7 4.6  CL 97*  --  97* 99 100  CO2 27  --   --  33* 33*  GLUCOSE 126*  --  123* 79 102*  BUN 20  --  21 16 14   CREATININE 1.38*  --  1.40* 1.27* 1.15  CALCIUM 8.7*  --   --  8.7* 8.8*  MG  --  1.6*  --  1.9  --   PHOS  --   --   --   --  3.4   GFR: Estimated Creatinine Clearance: 48.3 mL/min (by C-G formula based on SCr of 1.15 mg/dL). Liver Function Tests: Recent Labs  Lab 08/06/20 1311 08/08/20 1122  AST 50*  --   ALT  18  --   ALKPHOS 80  --   BILITOT 0.7  --   PROT 5.7*  --   ALBUMIN 2.5* 2.6*   No results for input(s): LIPASE, AMYLASE in the last 168 hours. No  results for input(s): AMMONIA in the last 168 hours. Coagulation Profile: No results for input(s): INR, PROTIME in the last 168 hours. Cardiac Enzymes: No results for input(s): CKTOTAL, CKMB, CKMBINDEX, TROPONINI in the last 168 hours. BNP (last 3 results) No results for input(s): PROBNP in the last 8760 hours. HbA1C: Recent Labs    08/06/20 2138  HGBA1C 4.9   CBG: Recent Labs  Lab 08/06/20 1709  GLUCAP 97   Lipid Profile: No results for input(s): CHOL, HDL, LDLCALC, TRIG, CHOLHDL, LDLDIRECT in the last 72 hours. Thyroid Function Tests: No results for input(s): TSH, T4TOTAL, FREET4, T3FREE, THYROIDAB in the last 72 hours. Anemia Panel: No results for input(s): VITAMINB12, FOLATE, FERRITIN, TIBC, IRON, RETICCTPCT in the last 72 hours. Sepsis Labs: Recent Labs  Lab 08/06/20 2138  PROCALCITON <0.10    Recent Results (from the past 240 hour(s))  SARS Coronavirus 2 by RT PCR (hospital order, performed in [facility] hospital lab) Nasopharyngeal Nasopharyngeal Swab     Status: None   Collection Time: 08/06/20  1:28 PM   Specimen: Nasopharyngeal Swab  Result Value Ref Range Status   SARS Coronavirus 2 NEGATIVE NEGATIVE Final    Comment: (NOTE) SARS-CoV-2 target nucleic acids are NOT DETECTED.  The SARS-CoV-2 RNA is generally detectable in upper and lower respiratory specimens during the acute phase of infection. The lowest concentration of SARS-CoV-2 viral copies this assay can detect is 250 copies / mL. A negative result does not preclude SARS-CoV-2 infection and should not be used as the sole basis for treatment or other patient management decisions.  A negative result may occur with improper specimen collection / handling, submission of specimen other than nasopharyngeal swab, presence of viral mutation(s) within the areas targeted by this assay, and inadequate number of viral copies (<250 copies / mL). A negative result must be combined with clinical observations,  patient history, and epidemiological information.  Fact Sheet for Patients:   StrictlyIdeas.no  Fact Sheet for Healthcare Providers: BankingDealers.co.za  This test is not yet approved or  cleared by the [place] FDA and has been authorized for detection and/or diagnosis of SARS-CoV-2 by FDA under an Emergency Use Authorization (EUA).  This EUA will remain in effect (meaning this test can be used) for the duration of the COVID-19 declaration under Section 564(b)(1) of the Act, 21 U.S.C. section 360bbb-3(b)(1), unless the authorization is terminated or revoked sooner.  Performed at [facility], [place] [address], [place], [place] [address]   Urine culture     Status: None   Collection Time: 08/06/20  2:45 PM   Specimen: Urine, Clean Catch  Result Value Ref Range Status   Specimen Description URINE, CLEAN CATCH  Final   Special Requests NONE  Final   Culture   Final    NO GROWTH Performed at [facility], [place] [address], [place], [place] [address]    Report Status 08/07/2020 FINAL  Final  Culture, blood (routine x 2)     Status: None (Preliminary result)   Collection Time: 08/06/20  3:28 PM   Specimen: BLOOD RIGHT FOREARM  Result Value Ref Range Status   Specimen Description BLOOD RIGHT FOREARM  Final   Special Requests   Final    BOTTLES DRAWN  AEROBIC AND ANAEROBIC Blood Culture results may not be optimal due to an inadequate volume of blood received in culture bottles   Culture   Final    NO GROWTH 2 DAYS Performed at [facility], [place] [address] [place], [place] [address]    Report Status PENDING  Incomplete  Culture, blood (routine x 2)     Status: None (Preliminary result)   Collection Time: 08/06/20  3:37 PM   Specimen: BLOOD LEFT FOREARM  Result Value Ref Range Status   Specimen Description BLOOD LEFT FOREARM  Final   Special Requests   Final    BOTTLES DRAWN AEROBIC AND ANAEROBIC Blood Culture  results may not be optimal due to an inadequate volume of blood received in culture bottles   Culture   Final    NO GROWTH 2 DAYS Performed at [facility], [place] [address], [place], [place] [address]    Report Status PENDING  Incomplete      Radiology Studies: CT Head Wo Contrast  Result Date: 08/06/2020 CLINICAL DATA:  Mental status changes EXAM: CT HEAD WITHOUT CONTRAST TECHNIQUE: Contiguous axial images were obtained from the base of the skull through the vertex without intravenous contrast. COMPARISON:  08/01/2019 FINDINGS: Brain: There is atrophy and chronic small vessel disease changes. No acute intracranial abnormality. Specifically, no hemorrhage, hydrocephalus, mass lesion, acute infarction, or significant intracranial injury. Vascular: No hyperdense vessel or unexpected calcification. Skull: No acute calvarial abnormality. Sinuses/Orbits: No acute findings. Old left medial orbital wall blowout fracture. Other: None IMPRESSION: Atrophy, chronic microvascular disease. No acute intracranial abnormality. Electronically Signed   By: Rolm Baptise M.D.   On: 08/06/2020 15:09   CT Angio Chest PE W and/or Wo Contrast  Result Date: 08/06/2020 CLINICAL DATA:  Syncope EXAM: CT ANGIOGRAPHY CHEST WITH CONTRAST TECHNIQUE: Multidetector CT imaging of the chest was performed using the standard protocol during bolus administration of intravenous contrast. Multiplanar CT image reconstructions and MIPs were obtained to evaluate the vascular anatomy. CONTRAST:  11mL OMNIPAQUE IOHEXOL 350 MG/ML SOLN COMPARISON:  06/20/2020 FINDINGS: Cardiovascular: Heart is normal size. Coronary artery and aortic atherosclerosis. No aneurysm. No filling defects in the pulmonary arteries to suggest pulmonary emboli. Mediastinum/Nodes: No mediastinal, hilar, or axillary adenopathy. Trachea and thyroid unremarkable. Stomach is delete that esophagus is filled with food material/debris. Lungs/Pleura: Masslike airspace opacity in  the right lower lobe. Overall, airspace disease has increased since prior study. Small right pleural effusion, also increased since prior study. Left lung clear. Upper Abdomen: No acute findings. Musculoskeletal: Chest wall soft tissues are unremarkable. No acute bony abnormality. Advanced degenerative changes with large flowing anterior osteophytes in the thoracic spine. Review of the MIP images confirms the above findings. IMPRESSION: Increasing masslike airspace opacity in the right lower lobe. Previously, the central mass could be visualized, surrounded by airspace disease. Currently, the central mass canal longer be identified within the central airspace opacity. Increasing size of small right pleural effusion. Food material/debris within the esophagus could be related to dysmotility or reflux. Coronary artery disease. No evidence of pulmonary embolus. Aortic Atherosclerosis (ICD10-I70.0). Electronically Signed   By: Rolm Baptise M.D.   On: 08/06/2020 15:25   ECHOCARDIOGRAM COMPLETE  Result Date: 08/07/2020    ECHOCARDIOGRAM REPORT   Patient Name:   DEJUAN ELMAN Date of Exam: 08/07/2020 Medical Rec #:  [id number]       Height:       65.0 in Accession #:    [id number]  Weight:       146.7 lb Date of Birth:  1946/12/07       BSA:          1.734 m Patient Age:    42 years        BP:           157/66 mmHg Patient Gender: M               HR:           82 bpm. Exam Location:  Inpatient Procedure: 2D Echo Indications:    acute diastolic CHF 664.40  History:        Patient has prior history of Echocardiogram examinations, most                 recent 09/20/2019. COPD and chronic kidney disease. Cancer; Risk                 Factors:Hypertension.  Sonographer:    [name] Referring Phys: [place]  1. Left ventricular ejection fraction, by estimation, is 60 to 65%. The left ventricle has normal function. The left ventricle has no regional wall motion abnormalities. Left ventricular  diastolic parameters are consistent with Grade I diastolic dysfunction (impaired relaxation).  2. Right ventricular systolic function is normal. The right ventricular size is normal. Tricuspid regurgitation signal is inadequate for assessing PA pressure.  3. The mitral valve is grossly normal. No evidence of mitral valve regurgitation. No evidence of mitral stenosis.  4. The aortic valve is tricuspid. Aortic valve regurgitation is not visualized. Mild aortic valve sclerosis is present, with no evidence of aortic valve stenosis.  5. The inferior vena cava is normal in size with greater than 50% respiratory variability, suggesting right atrial pressure of 3 mmHg. Comparison(s): No significant change from prior study. FINDINGS  Left Ventricle: Left ventricular ejection fraction, by estimation, is 60 to 65%. The left ventricle has normal function. The left ventricle has no regional wall motion abnormalities. The left ventricular internal cavity size was normal in size. There is  no left ventricular hypertrophy. Left ventricular diastolic parameters are consistent with Grade I diastolic dysfunction (impaired relaxation). Normal left ventricular filling pressure. Right Ventricle: The right ventricular size is normal. No increase in right ventricular wall thickness. Right ventricular systolic function is normal. Tricuspid regurgitation signal is inadequate for assessing PA pressure. Left Atrium: Left atrial size was normal in size. Right Atrium: Right atrial size was normal in size. Pericardium: Trivial pericardial effusion is present. Mitral Valve: The mitral valve is grossly normal. No evidence of mitral valve regurgitation. No evidence of mitral valve stenosis. Tricuspid Valve: The tricuspid valve is grossly normal. Tricuspid valve regurgitation is not demonstrated. No evidence of tricuspid stenosis. Aortic Valve: The aortic valve is tricuspid. . There is mild thickening and mild calcification of the aortic valve.  Aortic valve regurgitation is not visualized. Mild aortic valve sclerosis is present, with no evidence of aortic valve stenosis. There is mild thickening of the aortic valve. There is mild calcification of the aortic valve. Pulmonic Valve: The pulmonic valve was grossly normal. Pulmonic valve regurgitation is not visualized. No evidence of pulmonic stenosis. Aorta: The aortic root is normal in size and structure. Venous: The inferior vena cava is normal in size with greater than 50% respiratory variability, suggesting right atrial pressure of 3 mmHg. IAS/Shunts: The atrial septum is grossly normal.  LEFT VENTRICLE PLAX 2D LVIDd:  4.70 cm  Diastology LVIDs:         3.10 cm  LV e' lateral:   8.49 cm/s LV PW:         1.10 cm  LV E/e' lateral: 10.8 LV IVS:        0.80 cm  LV e' medial:    8.05 cm/s LVOT diam:     2.10 cm  LV E/e' medial:  11.3 LV SV:         82 LV SV Index:   47 LVOT Area:     3.46 cm  RIGHT VENTRICLE             IVC RV S prime:     12.30 cm/s  IVC diam: 1.90 cm TAPSE (M-mode): 1.9 cm LEFT ATRIUM             Index       RIGHT ATRIUM           Index LA diam:        4.40 cm 2.54 cm/m  RA Area:     16.20 cm LA Vol (A2C):   62.2 ml 35.87 ml/m RA Volume:   43.10 ml  24.86 ml/m LA Vol (A4C):   56.4 ml 32.53 ml/m LA Biplane Vol: 61.4 ml 35.41 ml/m  AORTIC VALVE LVOT Vmax:   112.00 cm/s LVOT Vmean:  64.700 cm/s LVOT VTI:    0.236 m  AORTA Ao Root diam: 2.90 cm MITRAL VALVE MV Area (PHT): 3.37 cm     SHUNTS MV Decel Time: 225 msec     Systemic VTI:  0.24 m MV E velocity: 91.30 cm/s   Systemic Diam: 2.10 cm MV A velocity: 112.00 cm/s MV E/A ratio:  0.82 Eleonore Chiquito MD Electronically signed by Eleonore Chiquito MD Signature Date/Time: 08/07/2020/2:34:08 PM    Final    VAS [place] LOWER EXTREMITY VENOUS (DVT) (MC and WL 7a-7p)  Result Date: 08/06/2020  Lower Venous DVTStudy Indications: Swelling.  Comparison Study: No prior studies. Performing Technologist: Darlin Coco  Examination Guidelines: A complete  evaluation includes B-mode imaging, spectral Doppler, color Doppler, and power Doppler as needed of all accessible portions of each vessel. Bilateral testing is considered an integral part of a complete examination. Limited examinations for reoccurring indications may be performed as noted. The reflux portion of the exam is performed with the patient in reverse Trendelenburg.  +-----+---------------+---------+-----------+----------+--------------+ RIGHTCompressibilityPhasicitySpontaneityPropertiesThrombus Aging +-----+---------------+---------+-----------+----------+--------------+ CFV  Full           Yes      Yes                                 +-----+---------------+---------+-----------+----------+--------------+   +---------+---------------+---------+-----------+----------+--------------+ LEFT     CompressibilityPhasicitySpontaneityPropertiesThrombus Aging +---------+---------------+---------+-----------+----------+--------------+ CFV      Full           Yes      Yes                                 +---------+---------------+---------+-----------+----------+--------------+ SFJ      Full                                                        +---------+---------------+---------+-----------+----------+--------------+ FV Prox  Full                                                        +---------+---------------+---------+-----------+----------+--------------+ FV Mid   Full                                                        +---------+---------------+---------+-----------+----------+--------------+ FV DistalFull                                                        +---------+---------------+---------+-----------+----------+--------------+ PFV      Full                                                        +---------+---------------+---------+-----------+----------+--------------+ POP      Full           Yes      Yes                                  +---------+---------------+---------+-----------+----------+--------------+ PTV      Full                                                        +---------+---------------+---------+-----------+----------+--------------+ PERO     Full                                                        +---------+---------------+---------+-----------+----------+--------------+     Summary: RIGHT: - No evidence of common femoral vein obstruction.  LEFT: - There is no evidence of deep vein thrombosis in the lower extremity.  - No cystic structure found in the popliteal fossa.  *See table(s) above for measurements and observations. Electronically signed by Harold Barban MD on 08/06/2020 at 4:56:58 PM.    Final      Scheduled Meds: . allopurinol  300 mg Oral Daily  . amLODipine  2.5 mg Oral Daily  . atorvastatin  20 mg Oral Daily  . cholecalciferol  2,000 Units Oral Daily  . donepezil  5 mg Oral QHS  . enoxaparin (LOVENOX) injection  40 mg Subcutaneous Q24H  . ferrous sulfate  325 mg Oral Q M,W,F  . magnesium oxide  400 mg Oral BID  . megestrol  400 mg Oral Daily  . mometasone-formoterol  2 puff Inhalation BID  . multivitamin with minerals  1 tablet Oral Daily  . pantoprazole  40 mg Oral BID AC  . potassium chloride SA  20 mEq Oral BID  . QUEtiapine  25 mg Oral BID  . senna-docusate  1 tablet Oral QHS  . sodium chloride flush  3 mL Intravenous Q12H  . sucralfate  1 g Oral QID  . cyanocobalamin  1,000 mcg Oral Daily   Continuous Infusions: . sodium chloride       LOS: 2 days    Time spent: 25 minutes spent in the coordination of care today.    Jonnie Finner, DO [facility]  If 7PM-7AM, please contact night-coverage [email or web address] 08/08/2020, 1:49 PM

## 2020-08-08 NOTE — Progress Notes (Addendum)
Pt removed and refusing cardiac monitoring. Very agitated and refusing care. Provider paged. Awaiting orders.  Pt threatening to leave AMA. Attempted to call daughter, John [name], twice.   Provider ordered 1mg  IV Ativan.

## 2020-08-08 NOTE — Progress Notes (Signed)
Informed by attending that patient having coughing with eating and plan to have SLP see.   Will plan to apply Zio patch prior to discharge and monitor tele while here.   John [name] [address] [place], PA-C  08/08/2020 12:12 PM

## 2020-08-08 NOTE — Evaluation (Signed)
Clinical/Bedside Swallow Evaluation Patient Details  Name: John [name] MRN: [id number] Date of Birth: [birth year]  Today's Date: 08/08/2020 Time: SLP Start Time (ACUTE ONLY): 1423 SLP Stop Time (ACUTE ONLY): 1444 SLP Time Calculation (min) (ACUTE ONLY): 21 min  Past Medical History:  Past Medical History:  Diagnosis Date  . B12 deficiency   . CHF (congestive heart failure) ([place])   . COPD (chronic obstructive pulmonary disease) ([place])   . Diabetes mellitus without complication ([place])   . Folate deficiency   . Gout   . Hyperlipemia   . Hypertension   . 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 MI (myocardial infarction) ([place])   . Pulmonary hypertension ([place])    Past Surgical History:  Past Surgical History:  Procedure Laterality Date  . APPENDECTOMY    . BIOPSY  04/21/2019   Procedure: BIOPSY;  Surgeon: Gatha Mayer, MD;  Location: [facility];  Service: Endoscopy;;  . ESOPHAGOGASTRODUODENOSCOPY (EGD) WITH PROPOFOL N/A 04/21/2019   Procedure: ESOPHAGOGASTRODUODENOSCOPY (EGD) WITH PROPOFOL;  Surgeon: Gatha Mayer, MD;  Location: [facility];  Service: Endoscopy;  Laterality: N/A;  . ESOPHAGOGASTRODUODENOSCOPY (EGD) WITH PROPOFOL N/A 04/29/2019   Procedure: ESOPHAGOGASTRODUODENOSCOPY (EGD) WITH PROPOFOL;  Surgeon: Mauri Pole, MD;  Location: [facility];  Service: Endoscopy;  Laterality: N/A;  . HEMOSTASIS CLIP PLACEMENT  04/29/2019   Procedure: HEMOSTASIS CLIP PLACEMENT;  Surgeon: Mauri Pole, MD;  Location: [facility];  Service: Endoscopy;;  Clip placed as marker not for bleeding control  . IR ANGIOGRAM SELECTIVE EACH ADDITIONAL VESSEL  04/29/2019  . IR ANGIOGRAM SELECTIVE EACH ADDITIONAL VESSEL  04/29/2019  . IR ANGIOGRAM SELECTIVE EACH ADDITIONAL VESSEL  04/29/2019  . IR ANGIOGRAM VISCERAL SELECTIVE  04/29/2019  . IR EMBO ART  VEN HEMORR LYMPH EXTRAV  INC GUIDE ROADMAPPING  04/29/2019  . IR US GUIDE VASC ACCESS  RIGHT  04/29/2019  . LUNG BIOPSY     HPI:  Pt is a 74 y.o. male with medical history significant of HTN, HLD, COPD, CHF, and squamous cell carcinoma of the right lung who presented to the ED with syncope. CXR 8/8: Increased airspace filling opacities in the right lower lobe, suggesting infectious infiltrate in addition to known right lower lobe mass. CT head negative for acute changes. CT chest 8/8: Increasing masslike airspace opacity in the right lower lobe. Previously, the central mass could be visualized, surrounded by airspace disease. Increasing size of small right pleural effusion. SLP consulted due to pt demonstrating coughing when eating.    Assessment / Plan / Recommendation Clinical Impression  Pt was seen for bedside swallow evaluation. Pt's speech intelligibility was significantly reduced even with a known context. His intelligible speech was often absent of meaningful content/unrelated to the SLP's question. Pt was unable to provide any meaningful history, but Tai, RN indicated that the pt exhibited coughing with thin liquids today. Pt was unable to follow any commands despite cueing and a complete oral mechanism exam therefore could not be conducted, but dentition was adequate and natural. He presented with symptoms of oropharyngeal dysphagia characterized by mildly reduced bolus awareness, prolonged mastication, oral residue, and signs of aspiration with thin liquids, regular texture solids and with 1/10 boluses of puree solids, suggesting aspiration. A dysphagia 1 diet with nectar thick liquids is recommended at this time. A modified barium swallow study will be conducted to further assess physiology.  SLP Visit Diagnosis: Dysphagia, oropharyngeal phase (R13.12)    Aspiration  Risk  Mild aspiration risk    Diet Recommendation Dysphagia 1 (Puree);Nectar-thick liquid   Liquid Administration via: Straw;Cup Medication Administration: Crushed with puree Supervision: Staff to assist with  self feeding;Full supervision/cueing for compensatory strategies Compensations: Slow rate;Small sips/bites;Follow solids with liquid Postural Changes: Seated upright at 90 degrees    Other  Recommendations Oral Care Recommendations: Oral care BID   Follow up Recommendations Other (comment) (TBD)      Frequency and Duration min 2x/week  2 weeks       Prognosis Prognosis for Safe Diet Advancement: Good Barriers to Reach Goals: Cognitive deficits;Severity of deficits      Swallow Study   General Date of Onset: 08/08/20 HPI: Pt is a 74 y.o. male with medical history significant of HTN, HLD, COPD, CHF, and squamous cell carcinoma of the right lung who presented to the ED with syncope. CXR 8/8: Increased airspace filling opacities in the right lower lobe, suggesting infectious infiltrate in addition to known right lower lobe mass. CT head negative for acute changes. CT chest 8/8: Increasing masslike airspace opacity in the right lower lobe. Previously, the central mass could be visualized, surrounded by airspace disease. Increasing size of small right pleural effusion. SLP consulted due to pt demonstrating coughing when eating.  Type of Study: Bedside Swallow Evaluation Previous Swallow Assessment: None Diet Prior to this Study: NPO Temperature Spikes Noted: No Respiratory Status: Nasal cannula History of Recent Intubation: No Behavior/Cognition: Alert;Cooperative;Pleasant mood Oral Cavity Assessment: Within Functional Limits Oral Care Completed by SLP: No Vision: Functional for self-feeding Self-Feeding Abilities: Needs assist Patient Positioning: Upright in bed;Postural control adequate for testing Baseline Vocal Quality: Normal Volitional Cough: Strong Volitional Swallow: Unable to elicit    Oral/Motor/Sensory Function Overall Oral Motor/Sensory Function: Within functional limits   Ice Chips Ice chips: Within functional limits Presentation: Spoon   Thin Liquid Thin Liquid:  Impaired Presentation: Straw Pharyngeal  Phase Impairments: Cough - Immediate    Nectar Thick Nectar Thick Liquid: Within functional limits Presentation: Straw   Honey Thick Honey Thick Liquid: Not tested   Puree Puree: Impaired Presentation: Spoon Pharyngeal Phase Impairments: Cough - Delayed (Once with intake of 4oz of puree)   Solid     Solid: Impaired Oral Phase Impairments: Poor awareness of bolus;Impaired mastication Oral Phase Functional Implications: Oral residue     Timi Reeser I. Hardin Negus, [place], [place] Office number [phone] Pager [place] 08/08/2020,2:58 PM

## 2020-08-08 NOTE — Progress Notes (Addendum)
Electrophysiology Rounding Note  Patient Name: John [name] Date of Encounter: 08/08/2020  Primary Cardiologist: No primary care provider on file. Electrophysiologist: New to Dr. Quentin Ore   Subjective   Quite lethargic this am after receiving ativan overnight for agitation. Responds to voice and touch, but does not answer questions.   Inpatient Medications    Scheduled Meds: . allopurinol  300 mg Oral Daily  . amLODipine  2.5 mg Oral Daily  . atorvastatin  20 mg Oral Daily  . cholecalciferol  2,000 Units Oral Daily  . donepezil  5 mg Oral QHS  . enoxaparin (LOVENOX) injection  40 mg Subcutaneous Q24H  . ferrous sulfate  325 mg Oral Q M,W,F  . magnesium oxide  400 mg Oral BID  . megestrol  400 mg Oral Daily  . mometasone-formoterol  2 puff Inhalation BID  . multivitamin with minerals  1 tablet Oral Daily  . pantoprazole  40 mg Oral BID AC  . potassium chloride SA  20 mEq Oral BID  . QUEtiapine  25 mg Oral BID  . senna-docusate  1 tablet Oral QHS  . sodium chloride flush  3 mL Intravenous Q12H  . sucralfate  1 g Oral QID  . cyanocobalamin  1,000 mcg Oral Daily   Continuous Infusions: . sodium chloride     PRN Meds: sodium chloride, acetaminophen, albuterol, sodium chloride flush, traMADol   Vital Signs    Vitals:   08/07/20 1933 08/07/20 2024 08/08/20 0250 08/08/20 0256  BP:  (!) 135/59 (!) 150/76   Pulse:  70 87   Resp:  16 17   Temp:  98.4 F (36.9 C) 98.5 F (36.9 C)   TempSrc:  Oral Oral   SpO2: 91% 100% 100%   Weight:    65.2 kg  Height:        Intake/Output Summary (Last 24 hours) at 08/08/2020 0800 Last data filed at 08/07/2020 2311 Gross per 24 hour  Intake 720 ml  Output 200 ml  Net 520 ml   Filed Weights   08/06/20 2116 08/07/20 0000 08/08/20 0256  Weight: 66.5 kg 66.5 kg 65.2 kg    Physical Exam    GEN- The patient is chronically ill and elderly appearing, alert to person.  Head- normocephalic, atraumatic Eyes-  Sclera clear,  conjunctiva pink Ears- hearing intact Oropharynx- clear Neck- supple Lungs- Clear to ausculation bilaterally, normal work of breathing Heart- Regular rate and rhythm, no murmurs, rubs or gallops GI- soft, NT, ND, + BS Extremities- no clubbing or cyanosis. No edema Skin- no rash or lesion Psych- Lethargic.  Neuro- Moves all 4.    Labs    CBC Recent Labs    08/06/20 1311 08/06/20 1716  WBC 8.4  --   NEUTROABS 7.2  --   HGB 8.4* 10.9*  HCT 28.0* 32.0*  MCV 99.3  --   PLT 284  --    Basic Metabolic Panel Recent Labs    08/06/20 1311 08/06/20 1311 08/06/20 1528 08/06/20 1716 08/07/20 0732  NA 138   < >  --  138 141  K 5.1   < >  --  4.8 4.7  CL 97*   < >  --  97* 99  CO2 27  --   --   --  33*  GLUCOSE 126*   < >  --  123* 79  BUN 20   < >  --  21 16  CREATININE 1.38*   < >  --  1.40* 1.27*  CALCIUM 8.7*  --   --   --  8.7*  MG  --   --  1.6*  --  1.9   < > = values in this interval not displayed.   Liver Function Tests Recent Labs    08/06/20 1311  AST 50*  ALT 18  ALKPHOS 80  BILITOT 0.7  PROT 5.7*  ALBUMIN 2.5*   No results for input(s): LIPASE, AMYLASE in the last 72 hours. Cardiac Enzymes No results for input(s): CKTOTAL, CKMB, CKMBINDEX, TROPONINI in the last 72 hours.   Telemetry    NSR 70-90s , no further slowing, or tachycardia (personally reviewed)  Radiology    CT Head Wo Contrast  Result Date: 08/06/2020 CLINICAL DATA:  Mental status changes EXAM: CT HEAD WITHOUT CONTRAST TECHNIQUE: Contiguous axial images were obtained from the base of the skull through the vertex without intravenous contrast. COMPARISON:  08/01/2019 FINDINGS: Brain: There is atrophy and chronic small vessel disease changes. No acute intracranial abnormality. Specifically, no hemorrhage, hydrocephalus, mass lesion, acute infarction, or significant intracranial injury. Vascular: No hyperdense vessel or unexpected calcification. Skull: No acute calvarial abnormality.  Sinuses/Orbits: No acute findings. Old left medial orbital wall blowout fracture. Other: None IMPRESSION: Atrophy, chronic microvascular disease. No acute intracranial abnormality. Electronically Signed   By: Rolm Baptise M.D.   On: 08/06/2020 15:09   CT Angio Chest PE W and/or Wo Contrast  Result Date: 08/06/2020 CLINICAL DATA:  Syncope EXAM: CT ANGIOGRAPHY CHEST WITH CONTRAST TECHNIQUE: Multidetector CT imaging of the chest was performed using the standard protocol during bolus administration of intravenous contrast. Multiplanar CT image reconstructions and MIPs were obtained to evaluate the vascular anatomy. CONTRAST:  66mL OMNIPAQUE IOHEXOL 350 MG/ML SOLN COMPARISON:  06/20/2020 FINDINGS: Cardiovascular: Heart is normal size. Coronary artery and aortic atherosclerosis. No aneurysm. No filling defects in the pulmonary arteries to suggest pulmonary emboli. Mediastinum/Nodes: No mediastinal, hilar, or axillary adenopathy. Trachea and thyroid unremarkable. Stomach is delete that esophagus is filled with food material/debris. Lungs/Pleura: Masslike airspace opacity in the right lower lobe. Overall, airspace disease has increased since prior study. Small right pleural effusion, also increased since prior study. Left lung clear. Upper Abdomen: No acute findings. Musculoskeletal: Chest wall soft tissues are unremarkable. No acute bony abnormality. Advanced degenerative changes with large flowing anterior osteophytes in the thoracic spine. Review of the MIP images confirms the above findings. IMPRESSION: Increasing masslike airspace opacity in the right lower lobe. Previously, the central mass could be visualized, surrounded by airspace disease. Currently, the central mass canal longer be identified within the central airspace opacity. Increasing size of small right pleural effusion. Food material/debris within the esophagus could be related to dysmotility or reflux. Coronary artery disease. No evidence of pulmonary  embolus. Aortic Atherosclerosis (ICD10-I70.0). Electronically Signed   By: Rolm Baptise M.D.   On: 08/06/2020 15:25   DG Chest Portable 1 View  Result Date: 08/06/2020 CLINICAL DATA:  Hypoxemia. Syncopal episode in his yard. History of RIGHT lung cancer 2011, LEFT lung cancer in 2016. Disease recurrence in May of 2020. EXAM: PORTABLE CHEST 1 VIEW COMPARISON:  08/09/2019 and CT chest on 06/20/2020 FINDINGS: The heart is enlarged and stable in configuration. Patchy airspace filling opacities in the RIGHT LOWER lobe are more opaque, suggesting infectious changes on pre-existing RIGHT LOWER lobe mass. LEFT lung is clear. No pulmonary edema. IMPRESSION: Increased airspace filling opacities in the RIGHT LOWER lobe, suggesting infectious infiltrate in addition to known RIGHT LOWER lobe mass. Electronically  Signed   By: Nolon Nations M.D.   On: 08/06/2020 13:45   ECHOCARDIOGRAM COMPLETE  Result Date: 08/07/2020    ECHOCARDIOGRAM REPORT   Patient Name:   John [name] Date of Exam: 08/07/2020 Medical Rec #:  [id number]       Height:       65.0 in Accession #:    [id number]      Weight:       146.7 lb Date of Birth:  [birth year]       BSA:          1.734 m Patient Age:    74 years        BP:           157/66 mmHg Patient Gender: M               HR:           82 bpm. Exam Location:  Inpatient Procedure: 2D Echo Indications:    acute diastolic CHF 025.42  History:        Patient has prior history of Echocardiogram examinations, most                 recent 09/20/2019. COPD and chronic kidney disease. Cancer; Risk                 Factors:Hypertension.  Sonographer:    [name] Referring Phys: [place]  1. Left ventricular ejection fraction, by estimation, is 60 to 65%. The left ventricle has normal function. The left ventricle has no regional wall motion abnormalities. Left ventricular diastolic parameters are consistent with Grade I diastolic dysfunction (impaired relaxation).  2. Right  ventricular systolic function is normal. The right ventricular size is normal. Tricuspid regurgitation signal is inadequate for assessing PA pressure.  3. The mitral valve is grossly normal. No evidence of mitral valve regurgitation. No evidence of mitral stenosis.  4. The aortic valve is tricuspid. Aortic valve regurgitation is not visualized. Mild aortic valve sclerosis is present, with no evidence of aortic valve stenosis.  5. The inferior vena cava is normal in size with greater than 50% respiratory variability, suggesting right atrial pressure of 3 mmHg. Comparison(s): No significant change from prior study. FINDINGS  Left Ventricle: Left ventricular ejection fraction, by estimation, is 60 to 65%. The left ventricle has normal function. The left ventricle has no regional wall motion abnormalities. The left ventricular internal cavity size was normal in size. There is  no left ventricular hypertrophy. Left ventricular diastolic parameters are consistent with Grade I diastolic dysfunction (impaired relaxation). Normal left ventricular filling pressure. Right Ventricle: The right ventricular size is normal. No increase in right ventricular wall thickness. Right ventricular systolic function is normal. Tricuspid regurgitation signal is inadequate for assessing PA pressure. Left Atrium: Left atrial size was normal in size. Right Atrium: Right atrial size was normal in size. Pericardium: Trivial pericardial effusion is present. Mitral Valve: The mitral valve is grossly normal. No evidence of mitral valve regurgitation. No evidence of mitral valve stenosis. Tricuspid Valve: The tricuspid valve is grossly normal. Tricuspid valve regurgitation is not demonstrated. No evidence of tricuspid stenosis. Aortic Valve: The aortic valve is tricuspid. . There is mild thickening and mild calcification of the aortic valve. Aortic valve regurgitation is not visualized. Mild aortic valve sclerosis is present, with no evidence of  aortic valve stenosis. There is mild thickening of the aortic valve. There is mild calcification of the aortic valve. Pulmonic Valve:  The pulmonic valve was grossly normal. Pulmonic valve regurgitation is not visualized. No evidence of pulmonic stenosis. Aorta: The aortic root is normal in size and structure. Venous: The inferior vena cava is normal in size with greater than 50% respiratory variability, suggesting right atrial pressure of 3 mmHg. IAS/Shunts: The atrial septum is grossly normal.  LEFT VENTRICLE PLAX 2D LVIDd:         4.70 cm  Diastology LVIDs:         3.10 cm  LV e' lateral:   8.49 cm/s LV PW:         1.10 cm  LV E/e' lateral: 10.8 LV IVS:        0.80 cm  LV e' medial:    8.05 cm/s LVOT diam:     2.10 cm  LV E/e' medial:  11.3 LV SV:         82 LV SV Index:   47 LVOT Area:     3.46 cm  RIGHT VENTRICLE             IVC RV S prime:     12.30 cm/s  IVC diam: 1.90 cm TAPSE (M-mode): 1.9 cm LEFT ATRIUM             Index       RIGHT ATRIUM           Index LA diam:        4.40 cm 2.54 cm/m  RA Area:     16.20 cm LA Vol (A2C):   62.2 ml 35.87 ml/m RA Volume:   43.10 ml  24.86 ml/m LA Vol (A4C):   56.4 ml 32.53 ml/m LA Biplane Vol: 61.4 ml 35.41 ml/m  AORTIC VALVE LVOT Vmax:   112.00 cm/s LVOT Vmean:  64.700 cm/s LVOT VTI:    0.236 m  AORTA Ao Root diam: 2.90 cm MITRAL VALVE MV Area (PHT): 3.37 cm     SHUNTS MV Decel Time: 225 msec     Systemic VTI:  0.24 m MV E velocity: 91.30 cm/s   Systemic Diam: 2.10 cm MV A velocity: 112.00 cm/s MV E/A ratio:  0.82 Eleonore Chiquito MD Electronically signed by Eleonore Chiquito MD Signature Date/Time: 08/07/2020/2:34:08 PM    Final    VAS [place] LOWER EXTREMITY VENOUS (DVT) (MC and WL 7a-7p)  Result Date: 08/06/2020  Lower Venous DVTStudy Indications: Swelling.  Comparison Study: No prior studies. Performing Technologist: Darlin Coco  Examination Guidelines: A complete evaluation includes B-mode imaging, spectral Doppler, color Doppler, and power Doppler as needed of all  accessible portions of each vessel. Bilateral testing is considered an integral part of a complete examination. Limited examinations for reoccurring indications may be performed as noted. The reflux portion of the exam is performed with the patient in reverse Trendelenburg.  +-----+---------------+---------+-----------+----------+--------------+ RIGHTCompressibilityPhasicitySpontaneityPropertiesThrombus Aging +-----+---------------+---------+-----------+----------+--------------+ CFV  Full           Yes      Yes                                 +-----+---------------+---------+-----------+----------+--------------+   +---------+---------------+---------+-----------+----------+--------------+ LEFT     CompressibilityPhasicitySpontaneityPropertiesThrombus Aging +---------+---------------+---------+-----------+----------+--------------+ CFV      Full           Yes      Yes                                 +---------+---------------+---------+-----------+----------+--------------+  SFJ      Full                                                        +---------+---------------+---------+-----------+----------+--------------+ FV Prox  Full                                                        +---------+---------------+---------+-----------+----------+--------------+ FV Mid   Full                                                        +---------+---------------+---------+-----------+----------+--------------+ FV DistalFull                                                        +---------+---------------+---------+-----------+----------+--------------+ PFV      Full                                                        +---------+---------------+---------+-----------+----------+--------------+ POP      Full           Yes      Yes                                 +---------+---------------+---------+-----------+----------+--------------+ PTV      Full                                                         +---------+---------------+---------+-----------+----------+--------------+ PERO     Full                                                        +---------+---------------+---------+-----------+----------+--------------+     Summary: RIGHT: - No evidence of common femoral vein obstruction.  LEFT: - There is no evidence of deep vein thrombosis in the lower extremity.  - No cystic structure found in the popliteal fossa.  *See table(s) above for measurements and observations. Electronically signed by Harold Barban MD on 08/06/2020 at 4:56:58 PM.    Final     Patient Profile     John [name] is a 74 y.o. male with a history of non-small cell lung cancer (most recent SBRT (radiation) completed 09/03/2019), h/o HFpEF, HTN, HLD, COPD, and dementia who is being seen today for the evaluation of syncope at  the request of Dr. Debara Pickett.  Assessment & Plan    1.  Syncope 1 at home associated with lifting and putting down a lawn mower, in the heat, without his O2 on. Another in the ED assisted with a bathroom visit, where patient reports BM. A third overnight 8/9 in the hospital associated with a bowel movement, desaturation off O2, and rate slowing.  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  With comorbidites and suspect mechanism, will pursue conservative approach of holding beta blockers and monitoring with a 14 day Zio patch on discharge.  Pt needs to wear O2 at all times, as prescribed.  PCT negative, UA negative, BCx NGTD, HS troponin negative.  Echo 08/07/2020 with LVEF 60-65%.  EEG negative  2. Atrial tachycardia No further. Will plan on placing Zio AT at discharge.  Continue to hold bisoprolol for now.   3. Slow, wide complex - ? Junctional rhythm 16 beats overnight then spontaneously back to sinus brady/NSR overnight 08/07/2020 while using the restroom.  No further with holding of bisoprolol.   For questions or updates, please contact [place] Please consult [email or web address] for contact info under Cardiology/STEMI.  Signed, Shirley Friar, PA-C  08/08/2020, 8:00 AM

## 2020-08-09 ENCOUNTER — Inpatient Hospital Stay (HOSPITAL_COMMUNITY): Payer: Medicare Other

## 2020-08-09 DIAGNOSIS — F0151 Vascular dementia with behavioral disturbance: Secondary | ICD-10-CM

## 2020-08-09 LAB — COMPREHENSIVE METABOLIC PANEL
ALT: 13 U/L (ref 0–44)
AST: 11 U/L — ABNORMAL LOW (ref 15–41)
Albumin: 2.8 g/dL — ABNORMAL LOW (ref 3.5–5.0)
Alkaline Phosphatase: 63 U/L (ref 38–126)
Anion gap: 9 (ref 5–15)
BUN: 16 mg/dL (ref 8–23)
CO2: 33 mmol/L — ABNORMAL HIGH (ref 22–32)
Calcium: 9 mg/dL (ref 8.9–10.3)
Chloride: 99 mmol/L (ref 98–111)
Creatinine, Ser: 1.33 mg/dL — ABNORMAL HIGH (ref 0.61–1.24)
GFR calc Af Amer: >60 mL/min (ref >60)
GFR calc non Af Amer: 52 mL/min — ABNORMAL LOW (ref >60)
Glucose, Bld: 92 mg/dL (ref 70–99)
Potassium: 4.5 mmol/L (ref 3.5–5.1)
Sodium: 141 mmol/L (ref 135–145)
Total Bilirubin: 0.8 mg/dL (ref 0.3–1.2)
Total Protein: 6.2 g/dL — ABNORMAL LOW (ref 6.5–8.1)

## 2020-08-09 LAB — TSH: TSH: 1.761 u[IU]/mL (ref 0.350–4.500)

## 2020-08-09 LAB — MAGNESIUM: Magnesium: 1.7 mg/dL (ref 1.7–2.4)

## 2020-08-09 LAB — CBC WITH DIFFERENTIAL/PLATELET
Abs Immature Granulocytes: 0.02 10*3/uL (ref 0.00–0.07)
Basophils Absolute: 0 10*3/uL (ref 0.0–0.1)
Basophils Relative: 0 %
Eosinophils Absolute: 0.1 10*3/uL (ref 0.0–0.5)
Eosinophils Relative: 2 %
HCT: 27.4 % — ABNORMAL LOW (ref 39.0–52.0)
Hemoglobin: 8.3 g/dL — ABNORMAL LOW (ref 13.0–17.0)
Immature Granulocytes: 0 %
Lymphocytes Relative: 6 %
Lymphs Abs: 0.5 10*3/uL — ABNORMAL LOW (ref 0.7–4.0)
MCH: 30.3 pg (ref 26.0–34.0)
MCHC: 30.3 g/dL (ref 30.0–36.0)
MCV: 100 fL (ref 80.0–100.0)
Monocytes Absolute: 0.7 10*3/uL (ref 0.1–1.0)
Monocytes Relative: 9 %
Neutro Abs: 6.6 10*3/uL (ref 1.7–7.7)
Neutrophils Relative %: 83 %
Platelets: 259 10*3/uL (ref 150–400)
RBC: 2.74 MIL/uL — ABNORMAL LOW (ref 4.22–5.81)
RDW: 17.8 % — ABNORMAL HIGH (ref 11.5–15.5)
WBC: 8 10*3/uL (ref 4.0–10.5)
nRBC: 0 % (ref 0.0–0.2)

## 2020-08-09 LAB — PHOSPHORUS: Phosphorus: 2.8 mg/dL (ref 2.5–4.6)

## 2020-08-09 LAB — VITAMIN B12: Vitamin B-12: 842 pg/mL (ref 180–914)

## 2020-08-09 MED ORDER — SODIUM CHLORIDE 0.9 % IV SOLN: INTRAVENOUS | Status: AC

## 2020-08-09 MED ORDER — MAGNESIUM SULFATE 2 GM/50ML IV SOLN
2.0000 g | Freq: Once | INTRAVENOUS | Status: AC
  Administered 2020-08-09: 2 g via INTRAVENOUS
  Filled 2020-08-09: qty 50

## 2020-08-09 MED ORDER — RESOURCE THICKENUP CLEAR PO POWD
ORAL | Status: DC | PRN
  Filled 2020-08-09: qty 125

## 2020-08-09 MED ORDER — NICOTINE 21 MG/24HR TD PT24
21.0000 mg | MEDICATED_PATCH | Freq: Every day | TRANSDERMAL | Status: DC
  Administered 2020-08-09 – 2020-08-11 (×3): 21 mg via TRANSDERMAL
  Filled 2020-08-09 (×5): qty 1

## 2020-08-09 MED ORDER — PANTOPRAZOLE SODIUM 40 MG PO PACK
40.0000 mg | PACK | Freq: Two times a day (BID) | ORAL | Status: DC
  Administered 2020-08-09 – 2020-08-12 (×6): 40 mg via ORAL
  Filled 2020-08-09 (×9): qty 20

## 2020-08-09 MED ORDER — HALOPERIDOL LACTATE 5 MG/ML IJ SOLN
2.0000 mg | Freq: Four times a day (QID) | INTRAMUSCULAR | Status: DC | PRN
  Administered 2020-08-09 – 2020-08-12 (×5): 2 mg via INTRAMUSCULAR
  Filled 2020-08-09 (×5): qty 1

## 2020-08-09 NOTE — Progress Notes (Signed)
Patient is more agitated today MD notified see epic for new orders. Will continue to monitor patient.

## 2020-08-09 NOTE — Progress Notes (Signed)
  Tele reviewed with Dr. Quentin Ore.   No further bradycardia (tele did have periods of undersensing, which were truly NSR in 90-100s)  EP team to see as needed while here. Will arrange zio on discharge.   Legrand Como [address]" [place], PA-C  08/09/2020 7:01 AM

## 2020-08-09 NOTE — Evaluation (Signed)
Physical Therapy Evaluation Patient Details Name: John [name] MRN: [id number] DOB: Feb 24, 1946 Today's Date: 08/09/2020   History of Present Illness  Pt is a 74 y.o. male with medical history significant of HTN, HLD, COPD, CHF, and squamous cell carcinoma of the right lung who presented to the ED with syncope. CXR 8/8: Increased airspace filling opacities in the right lower lobe, suggesting infectious infiltrate in addition to known right lower lobe mass. CT head negative for acute changes. CT chest 8/8: Increasing masslike airspace opacity in the right lower lobe. Previously, the central mass could be visualized, surrounded by airspace disease. Increasing size of small right pleural effusion.  Clinical Impression  Pt admitted with above diagnosis. Pt was able to ambulate in hallway with min assist for safety with RW as he needed a sitting rest break and sat in 2 chairs that were stacked together in hallway.  DOE 2/4. Rested and then walked back to room.  Pt very confused overall and not oriented to anything but person.  Nurse aware of pts poor safety and confusion. Pt currently with functional limitations due to the deficits listed below (see PT Problem List). Pt will benefit from skilled PT to increase their independence and safety with mobility to allow discharge to the venue listed below.      Follow Up Recommendations SNF;Supervision/Assistance - 24 hour    Equipment Recommendations  None recommended by PT    Recommendations for Other Services       Precautions / Restrictions Precautions Precautions: Fall Restrictions Weight Bearing Restrictions: No      Mobility  Bed Mobility Overal bed mobility: Independent             General bed mobility comments: had climbed to EOB prior to PT coming in room   Transfers Overall transfer level: Needs assistance Equipment used: Rolling walker (2 wheeled) Transfers: Sit to/from Stand Sit to Stand: Min assist;From elevated surface          General transfer comment: Pt was able to stand to RW with steadying assist  Ambulation/Gait Ambulation/Gait assistance: Min assist Gait Distance (Feet): 150 Feet Assistive device: Rolling walker (2 wheeled) Gait Pattern/deviations: Step-through pattern;Decreased stride length;Drifts right/left   Gait velocity interpretation: <1.31 ft/sec, indicative of household ambulator General Gait Details: Pt was able to ambulate with RW with min assist for stability as pt slighltly unsteady at times.  Pt got close to desk and stated :"I have to sit down" and proceeded to sit on chairs that were stacked in hallway to be repaired even though PT was asking pt not to sit there. Once he rested, he walked back to the room.  Sats would not pick up with portable monitor but pt did not appear to be in distress.    Stairs            Wheelchair Mobility    Modified Rankin (Stroke Patients Only)       Balance Overall balance assessment: Needs assistance Sitting-balance support: No upper extremity supported;Feet supported Sitting balance-Leahy Scale: Fair     Standing balance support: Bilateral upper extremity supported;During functional activity Standing balance-Leahy Scale: Poor Standing balance comment: relies on UE support for balance                              Pertinent Vitals/Pain Pain Assessment: No/denies pain    Home Living Family/patient expects to be discharged to:: Private residence   Available Help at Discharge:  Family;Available PRN/intermittently Type of Home: House Home Access: Stairs to enter Entrance Stairs-Rails: None Entrance Stairs-Number of Steps: 1 Home Layout: One level Home Equipment: Walker - 2 wheels;Cane - single point;Bedside commode      Prior Function Level of Independence: Independent with assistive device(s)         Comments: has not been using RW consistently, but uses BSC. Dtr helps manage IADLs     Hand Dominance    Dominant Hand: Right    Extremity/Trunk Assessment   Upper Extremity Assessment Upper Extremity Assessment: Defer to OT evaluation    Lower Extremity Assessment Lower Extremity Assessment: Generalized weakness    Cervical / Trunk Assessment Cervical / Trunk Assessment: Normal  Communication   Communication: No difficulties  Cognition Arousal/Alertness: Awake/alert Behavior During Therapy: Impulsive;Restless Overall Cognitive Status: History of cognitive impairments - at baseline Area of Impairment: Orientation;Attention;Memory;Following commands;Safety/judgement;Awareness;Problem solving                 Orientation Level: Disoriented to;Place;Time;Situation (I am at my daughters house) Current Attention Level: Focused Memory: Decreased short-term memory Following Commands: Follows one step commands inconsistently;Follows one step commands with increased time Safety/Judgement: Decreased awareness of safety;Decreased awareness of deficits   Problem Solving: Slow processing;Requires verbal cues;Decreased initiation General Comments: Pt very confused and not following commands. Sitting EOB on arrival as he climbed around to end of bed with bed rail up.       General Comments      Exercises     Assessment/Plan    PT Assessment Patient needs continued PT services  PT Problem List Decreased activity tolerance;Decreased balance;Decreased mobility;Decreased knowledge of use of DME;Decreased safety awareness;Decreased knowledge of precautions       PT Treatment Interventions DME instruction;Gait training;Functional mobility training;Therapeutic activities;Therapeutic exercise;Balance training;Patient/family education    PT Goals (Current goals can be found in the Care Plan section)  Acute Rehab PT Goals Patient Stated Goal: unable to state PT Goal Formulation: With patient Time For Goal Achievement: 08/23/20 Potential to Achieve Goals: Good    Frequency Min  2X/week   Barriers to discharge Decreased caregiver support      Co-evaluation               AM-PAC PT "6 Clicks" Mobility  Outcome Measure Help needed turning from your back to your side while in a flat bed without using bedrails?: None Help needed moving from lying on your back to sitting on the side of a flat bed without using bedrails?: None Help needed moving to and from a bed to a chair (including a wheelchair)?: A Little Help needed standing up from a chair using your arms (e.g., wheelchair or bedside chair)?: A Little Help needed to walk in hospital room?: A Little Help needed climbing 3-5 steps with a railing? : A Lot 6 Click Score: 19    End of Session Equipment Utilized During Treatment: Gait belt Activity Tolerance: Patient limited by fatigue Patient left: with call bell/phone within reach;in bed;with bed alarm set;with nursing/sitter in room Nurse Communication: Mobility status PT Visit Diagnosis: Unsteadiness on feet (R26.81);History of falling (Z91.81)    Time: 1287-8676 PT Time Calculation (min) (ACUTE ONLY): 18 min   Charges:   PT Evaluation $PT Eval Moderate Complexity: 1 Mod          Boone Gear W,PT Acute Rehabilitation Services Pager:  [phone]  Office:  [place] 08/09/2020, 11:04 AM

## 2020-08-09 NOTE — NC FL2 (Signed)
[place] LEVEL OF CARE SCREENING TOOL     IDENTIFICATION  Patient Name: John [name] Birthdate: 04/12/1946 Sex: male Admission Date (Current Location): 08/06/2020  [facility] and [place] Number:  Herbalist and Address:  [facility], [place] [address], [place], [place] [address]      Provider Number: [id number]  Attending Physician Name and Address:  Kerney Elbe, DO  Relative Name and Phone Number:  Willette Cluster [phone]    Current Level of Care: Hospital Recommended Level of Care: [place] Prior Approval Number:    Date Approved/Denied:   PASRR Number: [id number]  Discharge Plan: SNF    Current Diagnoses: Patient Active Problem List   Diagnosis Date Noted  . Sinus node dysfunction (HCC)   . FTT (failure to thrive) in adult   . Iron deficiency anemia   . ARF (acute renal failure) ([place]) 09/02/2019  . Hypomagnesemia 09/02/2019  . Acute pain of left knee   . Acute metabolic encephalopathy 68/11/7516  . Goals of care, counseling/discussion 08/05/2019  . Acute renal failure (ARF) ([place]) 07/08/2019  . Hypokalemia 07/08/2019  . Vascular dementia without behavioral disturbance ([place])   . Helicobacter pylori gastritis   . Chronic duodenal ulcer with hemorrhage   . Multiple gastric ulcers   . Acute esophagitis   . Gastrointestinal hemorrhage 04/20/2019  . Vitamin B12 deficiency 04/20/2019  . Folate deficiency 04/20/2019  . Dehydration 04/20/2019  . Hypotension 04/20/2019  . Melena   . Acute blood loss anemia   . CKD (chronic kidney disease), stage III 04/08/2019  . Right lower lobe lung mass 04/08/2019  . Syncope and collapse 04/08/2019  . Symptomatic anemia 04/08/2019  . Occult GI bleeding 04/08/2019  . Hyperglycemia 04/08/2019  . Leukocytosis 04/08/2019  . Syncope 04/08/2019  . Low hemoglobin   . Dyspnea on exertion 08/25/2017  . Bursitis of elbow 03/15/2016  . Obesity (BMI 30-39.9) 12/03/2015   . Primary cancer of right lower lobe of lung ([place]) 06/14/2015  . Solitary pulmonary nodule 03/13/2015  . RVF (right ventricular failure) ([place]) 11/04/2014  . COPD II/III with reversibility  12/19/2012  . Chronic respiratory failure with hypoxia ([place]) 12/18/2012  . Drug-induced hyperglycemia 12/08/2012  . Allergic drug rash 12/06/2012  . Pulmonary HTN ([place]) 03/18/2012  . Abnormal echocardiogram 03/18/2012  . LV dysfunction 03/18/2012  . COPD exacerbation ([place]) 02/22/2012  . Chronic diastolic CHF (congestive heart failure) ([place]) 02/22/2012  . Diabetes mellitus ([place]) 02/22/2012  . HTN (hypertension) 02/22/2012  . CAD (coronary artery disease) 02/22/2012  . Acute on chronic systolic heart failure ([place]) 02/22/2012    Orientation RESPIRATION BLADDER Height & Weight     Self  O2 (Nasal Cannula 2 liters)   Weight: 152 lb 12.5 oz (69.3 kg) Height:  5\' 5"  (165.1 cm)  BEHAVIORAL SYMPTOMS/MOOD NEUROLOGICAL BOWEL NUTRITION STATUS      Continent (WDL) Diet (See Discharge Summary)  AMBULATORY STATUS COMMUNICATION OF NEEDS Skin   Limited Assist Verbally Skin abrasions, Other (Comment) (Approp for ethnicity,dry,abrasion knee left,excoriated loc. knee right non-tenting)                       Personal Care Assistance Level of Assistance  Bathing, Feeding, Dressing Bathing Assistance: Limited assistance Feeding assistance: Limited assistance (Needs assist;Cardiac) Dressing Assistance: Limited assistance     Functional Limitations Info  Sight, Hearing, Speech Sight Info: Impaired Hearing Info: Impaired Speech Info: Adequate    SPECIAL CARE FACTORS FREQUENCY  PT (By licensed PT), OT (By licensed OT)     PT Frequency: 5x min weekly OT Frequency: 5x min weekly            Contractures Contractures Info: Not present    Additional Factors Info  Code Status, Allergies, Psychotropic Code Status Info: DNR Allergies Info: Levaquin (levofloxacin In D5w),Lisinopril Psychotropic Info:  QUEtiapine (SEROQUEL) tablet 25 mg 2 times daily,         Current Medications (08/09/2020):  This is the current hospital active medication list Current Facility-Administered Medications  Medication Dose Route Frequency Provider Last Rate Last Admin  . 0.9 %  sodium chloride infusion  250 mL Intravenous PRN Norins, Heinz Knuckles, MD      . 0.9 %  sodium chloride infusion   Intravenous Continuous Raiford Noble Allyn, DO 75 mL/hr at 08/09/20 1232 New Bag at 08/09/20 1232  . acetaminophen (TYLENOL) tablet 650 mg  650 mg Oral Q6H PRN Neena Rhymes, MD   650 mg at 08/07/20 1023  . albuterol (PROVENTIL) (2.5 MG/3ML) 0.083% nebulizer solution 2.5 mg  2.5 mg Inhalation Q4H PRN Norins, Heinz Knuckles, MD      . allopurinol (ZYLOPRIM) tablet 300 mg  300 mg Oral Daily Norins, Heinz Knuckles, MD   300 mg at 08/09/20 0907  . amLODipine (NORVASC) tablet 2.5 mg  2.5 mg Oral Daily Norins, Heinz Knuckles, MD   2.5 mg at 08/09/20 0906  . atorvastatin (LIPITOR) tablet 20 mg  20 mg Oral Daily Norins, Heinz Knuckles, MD   20 mg at 08/09/20 0907  . cholecalciferol (VITAMIN D3) tablet 2,000 Units  2,000 Units Oral Daily Norins, Heinz Knuckles, MD   2,000 Units at 08/09/20 [phone]  . donepezil (ARICEPT) tablet 5 mg  5 mg Oral QHS Norins, Heinz Knuckles, MD   5 mg at 08/08/20 2135  . enoxaparin (LOVENOX) injection 40 mg  40 mg Subcutaneous Q24H Norins, Heinz Knuckles, MD   40 mg at 08/08/20 2125  . ferrous sulfate tablet 325 mg  325 mg Oral Q M,W,F Norins, Heinz Knuckles, MD   325 mg at 08/09/20 0907  . haloperidol lactate (HALDOL) injection 2 mg  2 mg Intramuscular Q6H PRN Raiford Noble Latif, DO   2 mg at 08/09/20 1438  . magnesium oxide (MAG-OX) tablet 400 mg  400 mg Oral BID Neena Rhymes, MD   400 mg at 08/09/20 0907  . megestrol (MEGACE) 400 MG/10ML suspension 400 mg  400 mg Oral Daily Norins, Heinz Knuckles, MD   400 mg at 08/09/20 1243  . mometasone-formoterol (DULERA) 200-5 MCG/ACT inhaler 2 puff  2 puff Inhalation BID Norins, Heinz Knuckles, MD   2 puff at  08/09/20 0848  . multivitamin with minerals tablet 1 tablet  1 tablet Oral Daily Norins, Heinz Knuckles, MD   1 tablet at 08/09/20 0907  . nicotine (NICODERM CQ - dosed in mg/24 hours) patch 21 mg  21 mg Transdermal Daily Raiford Noble Latif, DO   21 mg at 08/09/20 1700  . pantoprazole sodium (PROTONIX) 40 mg/20 mL oral suspension 40 mg  40 mg Oral BID AC McCarthy, Megan L, RPH   40 mg at 08/09/20 1720  . potassium chloride SA (KLOR-CON) CR tablet 20 mEq  20 mEq Oral BID Neena Rhymes, MD   20 mEq at 08/09/20 0907  . QUEtiapine (SEROQUEL) tablet 25 mg  25 mg Oral BID Neena Rhymes, MD   25 mg at 08/09/20 0907  . senna-docusate (Senokot-S) tablet 1 tablet  1 tablet Oral QHS Norins, Heinz Knuckles, MD   1 tablet at 08/08/20 2125  . sodium chloride flush (NS) 0.9 % injection 3 mL  3 mL Intravenous Q12H Norins, Heinz Knuckles, MD   3 mL at 08/09/20 1244  . sodium chloride flush (NS) 0.9 % injection 3 mL  3 mL Intravenous PRN Norins, Heinz Knuckles, MD      . sucralfate (CARAFATE) tablet 1 g  1 g Oral QID Norins, Heinz Knuckles, MD   1 g at 08/09/20 1720  . traMADol (ULTRAM) tablet 50 mg  50 mg Oral Q6H PRN Neena Rhymes, MD   50 mg at 08/08/20 0209  . vitamin B-12 (CYANOCOBALAMIN) tablet 1,000 mcg  1,000 mcg Oral Daily Norins, Heinz Knuckles, MD   1,000 mcg at 08/09/20 0903     Discharge Medications: Please see discharge summary for a list of discharge medications.  Relevant Imaging Results:  Relevant Lab Results:   Additional Information [phone]  Trula Ore, LCSWA

## 2020-08-09 NOTE — Progress Notes (Signed)
   Appreciate EP recs- continue to hold BB. They will arrange for Zio on discharge. Would follow-up with EP after discharge.  [facility] will sign off.   Medication Recommendations:  Hold BB Other recommendations (labs, testing, etc):  Zio monitor on discharge Follow up as an outpatient:  Cardiac EP - Dr. Guilford Shi, MD, [facility], [place] Director of the [facility] Lipid Disorders &  Cardiovascular Risk [facility] Diplomate of the American Board of Clinical Lipidology Attending Cardiologist  Direct Dial: [phone]  Fax: [phone]  Website:  [email or web address]

## 2020-08-09 NOTE — Progress Notes (Signed)
PROGRESS NOTE    John [name]  GLO:[id number] DOB: October 02, 1946 DOA: 08/06/2020 PCP: John Esters, NP   Brief Narrative:  HPI per Dr. Adella Hare on 08/06/20 John [name] is a 74 y.o. male with medical history significant of HTN, HLD, COPD, CHF, and squamous cell carcinoma of the right lung presents to the ED with syncope while working in the yard today.Per EMS the patient was working in the yard with his son which is not unusual. The son apparently witnessed the patient collapsed to the ground and lose consciousness.He felt mainly to his knees and there was no reported head injury or trauma. Fire arrived on scene and reported that the patient was hypoxic to the 80s.They started nonrebreather and when EMS arrived they were able to titrate to nasal cannula currently at 1 L.No witnessed seizure activity. The patient does not recall the event. He does not recall feeling badly prior to this. He states that overall his energy has been good, he has been eating well, compliant with his medications.No recent medication changes. Patient is currently followed by palliative care but not in the hospice program. Review of recent Oncology note mentions patient is on home O2 but not apparently wearing this today.  As patient was walking out of dept after being discharged had a witnessed syncopal episode with collapse with respiratory distress requiring brief assistant. He was noted to have non-sustained V-Tach. Due to two episodes of syncope he is referred to [facility] for tele admission for possible arrythmogenic syncope. Cardiology is to see patient in the ED.    Patient had previous admission for syncope and collapse 2020 with negative EEG and neuro consult - seizure ruled out.   ED Course: afebrile, 143/68, HR 91, RR 22 O2 sat 95%. Lab results significant for glucose 123, Cr 1.38-at his baseline, BNP 209.9, Troponin #1 6, #2 9. Hgb 8.4 '@1311'  hras, 10.9 @ 1716 hrs. CTA chest negative for  PE, RLL mass enlarged. Due to outpatient syncope and witnessed syncope in the ED he is referred to [facility] for tele admission to monitor for arrythmia.  Review of Systems: As per HPI otherwise 10 point review of systems negative. Caveat - patient has very poor short term memory.  **Interim History His beta-blockers were held and he has had no further bradycardia.  The EP team recommends to see as needed and they can arrange a zio on discharge.  Medical cardiology recommends continue to hold beta-blocker and will follow up as an outpatient as well.  PT OT recommending SNF.  Patient was having some dysphagia yesterday and SLP recommending dysphagia 3 diet with nectar thick liquids.  He was extremely agitated and confused today but he does have a history of dementia and family states that he does have some behavioral disturbances but not this bad.  We will continue to monitor him closely and will empirically work-up the patient for SNF given his the patient's daughter will still decide whether to take him to SNF or take him home.  Assessment & Plan:   Active Problems:   Chronic diastolic CHF (congestive heart failure) (HCC)   Diabetes mellitus (HCC)   HTN (hypertension)   COPD II/III with reversibility    Primary cancer of right lower lobe of lung (HCC)   CKD (chronic kidney disease), stage III   Syncope and collapse   Vascular dementia without behavioral disturbance (HCC)   Sinus node dysfunction (HCC)   Syncope and Collapse - has had multiple episodes and at least  one admission for this problem.  - Neurologic etiology has been ruled out: negative EEG, neuro consult opinion.  - CT brain without acute changes or mets. Suspect cardiogenic etiology. - Cards/EP: seeing sinsus brady and excessive vagal tone; holding BB  - no further syncopal episodes; patient to continue holding BB, place zio patch at discharge; appreciate cards/EP assistance  Cough - ?dyspahgia; coughing w/ drinking/eating  per nursing - weak cough and fair amount of upper airway transmission today - NPO, except meds - SLP consult done and showed an MBS with dysphagia 3 diet with nectar thick liquids  Chronic HFpEF  - mild elevation of BNP. Does not appear decompensated.  - Last Echo Dec '20 with EF 60-65% and impaired relaxtion. - Echo 8/9: Left ventricular ejection fraction, by estimation, is 60 to 65%. The left ventricle has normal function. The left ventricle has no regional wall motion abnormalities. Left ventricular diastolic parameters are consistent with Grade I diastolic dysfunction (impaired relaxation).  Controlled DMt2  - on no medication.  - Serum glucose with mild elevation. - A1c: 4.9  Progressive NSCCA RLL with increased mass size  - seen on CTA.  - Pt losing wt. - Follow up with Dr. Earlie Server outpt.   COPD  - By records was on home O2 at one time - CXR suggests possible infiltrate but CTA does not reveal any infiltrate.  - He was given Ceftriaxone and Azithromycin in ED but is afebrile and no leukcytosis, no cough or sputum production, no hypoxemia. - holding further abx - C/wdulera, PRN albuterol - O2 support; wean as able  Dementia with Behavioral Disturbances -C/w aricept, seroquel -Delrium Precautions -Haldol for Agitation -1:1 Sitter  -May need to have MRI to rule out metastasis to the brain given his significant agitation -Will consider a Palliative Consult  HLD -Atorvastatin  HTN -C/w Amlodipine 2.5 mg p.o. daily  AKI -baseline SCr 1.0 - 1.1 -Scr at admission 1.4; had originally resolved but will resume IVF given worsened Renal Fxn -Started back on IV fluid hydration with normal saline at 75 mL's per hour -Avoid nephrotoxic medications, contrast dyes, hypotension and renally adjust medications -Repeat CMP in a.m.  Normocytic Anemia -Patient's Hgb/Hct went from 8.5/28.2 -> 8.3/27.4 -Check Anemia Panel in the AM  -Continue to Monitor for S/Sx of  Bleeding -Currently no overt Bleeding noted  DVT prophylaxis: Enoxaparin 40 mg subcu every 24h Code Status: FULL CODE  Family Communication: Discussed with Daughter over the Telephone Disposition Plan: SNF vs. Home Health  Status is: Inpatient  Remains inpatient appropriate because:Altered mental status and Inpatient level of care appropriate due to severity of illness   Dispo: The patient is from: Home              Anticipated d/c is to: TBD              Anticipated d/c date is: 2 days              Patient currently is not medically stable to d/c.  Consultants:   EP Cardiology  Cardiology    Procedures: None  Antimicrobials:  Anti-infectives (From admission, onward)   Start     Dose/Rate Route Frequency Ordered Stop   08/06/20 1400  cefTRIAXone (ROCEPHIN) 1 g in sodium chloride 0.9 % 100 mL IVPB        1 g 200 mL/hr over 30 Minutes Intravenous  Once 08/06/20 1358 08/06/20 1651   08/06/20 1400  azithromycin (ZITHROMAX) 500 mg in sodium chloride 0.9 %  250 mL IVPB        500 mg 250 mL/hr over 60 Minutes Intravenous  Once 08/06/20 1358 08/06/20 1651   08/06/20 0000  amoxicillin (AMOXIL) 500 MG capsule     Discontinue     500 mg Oral 3 times daily 08/06/20 1649     08/06/20 0000  azithromycin (ZITHROMAX) 250 MG tablet     Discontinue     250 mg Oral Daily 08/06/20 1649       Subjective: Seen and examined at bedside and he was extremely confused and only oriented x1.  No nausea or vomiting.  Did not know why he was in the hospital and did not think he was in the hospital.  Denies any chest pain, lightheadedness or dizziness and nursing states that he has been agitated and not taking his medications.  After his SLP evaluation and MBS he came up extremely agitated and had to be brought out by security.  Was given as needed Haldol and then had to have a one-to-one sitter placed.  No other concerns or complaints at this time.  Objective: Vitals:   08/09/20 0541 08/09/20 0848  08/09/20 1101 08/09/20 1524  BP: (!) 155/69  (!) 149/73 (!) 149/73  Pulse: 93  92 70  Resp: '19  16 20  ' Temp: 98.1 F (36.7 C)  97.9 F (36.6 C) (!) 97.3 F (36.3 C)  TempSrc: Oral  Oral Oral  SpO2: 96% 92% 91% 100%  Weight:      Height:        Intake/Output Summary (Last 24 hours) at 08/09/2020 1908 Last data filed at 08/09/2020 1750 Gross per 24 hour  Intake 500 ml  Output 450 ml  Net 50 ml   Filed Weights   08/07/20 0000 08/08/20 0256 08/09/20 0535  Weight: 66.5 kg 65.2 kg 69.3 kg   Examination: Physical Exam:  Constitutional: WN/WD slightly overweight African-American male currently in NAD and appears calm and is pleasantly confused Eyes: Lids and conjunctivae normal, sclerae anicteric  ENMT: External Ears, Nose appear normal. Grossly normal hearing.  Neck: Appears normal, supple, no cervical masses, normal ROM, no appreciable thyromegaly; no JVD Respiratory: Diminished to auscultation bilaterally, no wheezing, rales, rhonchi or crackles. Normal respiratory effort and patient is not tachypenic. No accessory muscle use.  Unlabored breathing Cardiovascular: RRR, no murmurs / rubs / gallops. S1 and S2 auscultated.  Minimal extremity edema Abdomen: Soft, non-tender, mildly distended secondary body habitus.  Bowel sounds positive.  GU: Deferred. Musculoskeletal: No clubbing / cyanosis of digits/nails. No joint deformity upper and lower extremities.  Skin: No rashes, lesions, ulcers on limited skin evaluation. No induration; Warm and dry.  Neurologic: CN 2-12 grossly intact with no focal deficits.  Romberg sign cerebellar reflexes not assessed.  Psychiatric: Impaired judgment and insight. Alert and oriented x 1. Normal mood and appropriate affect.   Data Reviewed: I have personally reviewed following labs and imaging studies  CBC: Recent Labs  Lab 08/06/20 1311 08/06/20 1716 08/08/20 1122 08/09/20 0750  WBC 8.4  --  7.6 8.0  NEUTROABS 7.2  --  6.2 6.6  HGB 8.4* 10.9*  8.5* 8.3*  HCT 28.0* 32.0* 28.2* 27.4*  MCV 99.3  --  100.0 100.0  PLT 284  --  277 664   Basic Metabolic Panel: Recent Labs  Lab 08/06/20 1311 08/06/20 1528 08/06/20 1716 08/07/20 0732 08/08/20 1122 08/09/20 0750  NA 138  --  138 141 142 141  K 5.1  --  4.8 4.7  4.6 4.5  CL 97*  --  97* 99 100 99  CO2 27  --   --  33* 33* 33*  GLUCOSE 126*  --  123* 79 102* 92  BUN 20  --  '21 16 14 16  ' CREATININE 1.38*  --  1.40* 1.27* 1.15 1.33*  CALCIUM 8.7*  --   --  8.7* 8.8* 9.0  MG  --  1.6*  --  1.9  --  1.7  PHOS  --   --   --   --  3.4 2.8   GFR: Estimated Creatinine Clearance: 41.7 mL/min (A) (by C-G formula based on SCr of 1.33 mg/dL (H)). Liver Function Tests: Recent Labs  Lab 08/06/20 1311 08/08/20 1122 08/09/20 0750  AST 50*  --  11*  ALT 18  --  13  ALKPHOS 80  --  63  BILITOT 0.7  --  0.8  PROT 5.7*  --  6.2*  ALBUMIN 2.5* 2.6* 2.8*   No results for input(s): LIPASE, AMYLASE in the last 168 hours. No results for input(s): AMMONIA in the last 168 hours. Coagulation Profile: No results for input(s): INR, PROTIME in the last 168 hours. Cardiac Enzymes: No results for input(s): CKTOTAL, CKMB, CKMBINDEX, TROPONINI in the last 168 hours. BNP (last 3 results) No results for input(s): PROBNP in the last 8760 hours. HbA1C: Recent Labs    08/06/20 2138  HGBA1C 4.9   CBG: Recent Labs  Lab 08/06/20 1709  GLUCAP 97   Lipid Profile: No results for input(s): CHOL, HDL, LDLCALC, TRIG, CHOLHDL, LDLDIRECT in the last 72 hours. Thyroid Function Tests: Recent Labs    08/09/20 1109  TSH 1.761   Anemia Panel: Recent Labs    08/09/20 1109  VITAMINB12 842   Sepsis Labs: Recent Labs  Lab 08/06/20 2138  PROCALCITON <0.10    Recent Results (from the past 240 hour(s))  SARS Coronavirus 2 by RT PCR (hospital order, performed in [facility] hospital lab) Nasopharyngeal Nasopharyngeal Swab     Status: None   Collection Time: 08/06/20  1:28 PM   Specimen:  Nasopharyngeal Swab  Result Value Ref Range Status   SARS Coronavirus 2 NEGATIVE NEGATIVE Final    Comment: (NOTE) SARS-CoV-2 target nucleic acids are NOT DETECTED.  The SARS-CoV-2 RNA is generally detectable in upper and lower respiratory specimens during the acute phase of infection. The lowest concentration of SARS-CoV-2 viral copies this assay can detect is 250 copies / mL. A negative result does not preclude SARS-CoV-2 infection and should not be used as the sole basis for treatment or other patient management decisions.  A negative result may occur with improper specimen collection / handling, submission of specimen other than nasopharyngeal swab, presence of viral mutation(s) within the areas targeted by this assay, and inadequate number of viral copies (<250 copies / mL). A negative result must be combined with clinical observations, patient history, and epidemiological information.  Fact Sheet for Patients:   StrictlyIdeas.no  Fact Sheet for Healthcare Providers: BankingDealers.co.za  This test is not yet approved or  cleared by the [place] FDA and has been authorized for detection and/or diagnosis of SARS-CoV-2 by FDA under an Emergency Use Authorization (EUA).  This EUA will remain in effect (meaning this test can be used) for the duration of the COVID-19 declaration under Section 564(b)(1) of the Act, 21 U.S.C. section 360bbb-3(b)(1), unless the authorization is terminated or revoked sooner.  Performed at [facility], [place],  [place] [address]   Urine culture     Status: None   Collection Time: 08/06/20  2:45 PM   Specimen: Urine, Clean Catch  Result Value Ref Range Status   Specimen Description URINE, CLEAN CATCH  Final   Special Requests NONE  Final   Culture   Final    NO GROWTH Performed at [facility], [address], [place], [place] [address]    Report Status 08/07/2020  FINAL  Final  Culture, blood (routine x 2)     Status: None (Preliminary result)   Collection Time: 08/06/20  3:28 PM   Specimen: BLOOD RIGHT FOREARM  Result Value Ref Range Status   Specimen Description BLOOD RIGHT FOREARM  Final   Special Requests   Final    BOTTLES DRAWN AEROBIC AND ANAEROBIC Blood Culture results may not be optimal due to an inadequate volume of blood received in culture bottles   Culture   Final    NO GROWTH 3 DAYS Performed at [facility],  [address], [place], [place] [address]    Report Status PENDING  Incomplete  Culture, blood (routine x 2)     Status: None (Preliminary result)   Collection Time: 08/06/20  3:37 PM   Specimen: BLOOD LEFT FOREARM  Result Value Ref Range Status   Specimen Description BLOOD LEFT FOREARM  Final   Special Requests   Final    BOTTLES DRAWN AEROBIC AND ANAEROBIC Blood Culture results may not be optimal due to an inadequate volume of blood received in culture bottles   Culture   Final    NO GROWTH 3 DAYS Performed at [facility], [place] [address] [place], [place] [address]    Report Status PENDING  Incomplete     RN Pressure Injury Documentation:     Estimated body mass index is 25.42 kg/m as calculated from the following:   Height as of this encounter: '5\' 5"'  (1.651 m).   Weight as of this encounter: 69.3 kg.  Malnutrition Type:      Malnutrition Characteristics:      Nutrition Interventions:    Radiology Studies: DG Swallowing Func-Speech Pathology  Result Date: 08/09/2020 Objective Swallowing Evaluation: Type of Study: MBS-Modified Barium Swallow Study  Patient Details Name: MAKHI MUZQUIZ MRN: [id number] Date of Birth: Oct 19, 1946 Today's Date: 08/09/2020 Time: SLP Start Time (ACUTE ONLY): 9798 -SLP Stop Time (ACUTE ONLY): 1321 SLP Time Calculation (min) (ACUTE ONLY): 14 min Past Medical History: Past Medical History: Diagnosis Date . B12 deficiency  . CHF (congestive heart failure) ([place])  . COPD  (chronic obstructive pulmonary disease) ([place])  . Diabetes mellitus without complication ([place])  . Folate deficiency  . Gout  . Hyperlipemia  . Hypertension  . Lung cancer ([place]) dx'd 2011/2017  squamous cell carcinoma of right lower lobe lung . MI (myocardial infarction) ([place])  . Pulmonary hypertension ([place])  Past Surgical History: Past Surgical History: Procedure Laterality Date . APPENDECTOMY   . BIOPSY  04/21/2019  Procedure: BIOPSY;  Surgeon: Gatha Mayer, MD;  Location: [place];  Service: Endoscopy;; . ESOPHAGOGASTRODUODENOSCOPY (EGD) WITH PROPOFOL N/A 04/21/2019  Procedure: ESOPHAGOGASTRODUODENOSCOPY (EGD) WITH PROPOFOL;  Surgeon: Gatha Mayer, MD;  Location: [facility];  Service: Endoscopy;  Laterality: N/A; . ESOPHAGOGASTRODUODENOSCOPY (EGD) WITH PROPOFOL N/A 04/29/2019  Procedure: ESOPHAGOGASTRODUODENOSCOPY (EGD) WITH PROPOFOL;  Surgeon: Mauri Pole, MD;  Location: [facility] ENDOSCOPY;  Service: Endoscopy;  Laterality: N/A; . HEMOSTASIS CLIP PLACEMENT  04/29/2019  Procedure: HEMOSTASIS CLIP PLACEMENT;  Surgeon: Mauri Pole, MD;  [facility] ENDOSCOPY;  Service: Endoscopy;;  Clip placed as marker not for bleeding control . IR ANGIOGRAM SELECTIVE EACH ADDITIONAL VESSEL  04/29/2019 . IR ANGIOGRAM SELECTIVE EACH ADDITIONAL VESSEL  04/29/2019 . IR ANGIOGRAM SELECTIVE EACH ADDITIONAL VESSEL  04/29/2019 . IR ANGIOGRAM VISCERAL SELECTIVE  04/29/2019 . IR EMBO ART  VEN HEMORR LYMPH EXTRAV  INC GUIDE ROADMAPPING  04/29/2019 . IR US GUIDE VASC ACCESS RIGHT  04/29/2019 . LUNG BIOPSY   HPI: Pt is a 74 y.o. male with medical history significant of HTN, HLD, COPD, CHF, and squamous cell carcinoma of the right lung who presented to the ED with syncope. CXR 8/8: Increased airspace filling opacities in the right lower lobe, suggesting infectious infiltrate in addition to known right lower lobe mass. CT head negative for acute changes. CT chest 8/8: Increasing masslike airspace opacity in the right lower lobe.  Previously, the central mass could be visualized, surrounded by airspace disease. Increasing size of small right pleural effusion. SLP consulted due to pt demonstrating coughing when eating.  Subjective: Pt's study was limited due to pt's refusal to accept solid trials and subsequent agitation following encouragement from staff and his family. Pt became increasingly agitated as the session progressed. He ultimately unstrapped himself from the swallow chair and was physically resistant to staff's efforts to assist him back to the chair. Pt walked around the radiology suite with support from SLP, his niece, and multiple radiology techs while voicing his intent to leave the hospital and "get a cigarette". Pt's agitation reduced with some intervention from his niece and he was ultimately amenable to sitting a wheelchair. Security was contacted and pt was escorted back to his room with transport, three security gurards, and the pt's niece. Assessment / Plan / Recommendation CHL IP CLINICAL IMPRESSIONS 08/09/2020 Clinical Impression Subjective: Pt's study was limited due to pt's refusal to accept solid trials and subsequent agitation following encouragement from staff and his family. Pt became increasingly agitated as the session progressed. He ultimately unstrapped himself from the swallow chair and was physically resistant to staff's efforts to assist him back to the chair. Pt walked around the radiology suite with support from SLP, his niece, and multiple radiology techs while voicing his intent to leave the hospital and "get a cigarette". Pt's agitation reduced with some intervention from his niece and he was ultimately amenable to sitting a wheelchair. Security was contacted and pt was escorted back to his room with transport, three security gurards, and the pt's niece. Pt presented with pharyngeal dysphagia characterized by a pharyngeal delay, and reduced length of laryngeal vestibule closure which resulted in deep  penetration (PAS 5) and ultimate aspiration (PAS 7, 8) with inconsistent sensation. A dysphagia 3 diet with nectar thick liquids is recommended at this time. SLP will continue to follow pt.  SLP Visit Diagnosis Dysphagia, oropharyngeal phase (R13.12) Attention and concentration deficit following -- Frontal lobe and executive function deficit following -- Impact on safety and function Mild aspiration risk   CHL IP TREATMENT RECOMMENDATION 08/09/2020 Treatment Recommendations Therapy as outlined in treatment plan below   Prognosis 08/09/2020 Prognosis for Safe Diet Advancement Good Barriers to Reach Goals Cognitive deficits;Severity of deficits Barriers/Prognosis Comment -- CHL IP DIET RECOMMENDATION 08/09/2020 SLP Diet Recommendations Dysphagia 3 (Mech soft) solids;Nectar thick liquid Liquid Administration via Cup;Straw Medication Administration Crushed with puree Compensations Slow rate;Small sips/bites Postural Changes Remain semi-upright after after feeds/meals (Comment);Seated upright at 90 degrees   CHL IP OTHER RECOMMENDATIONS  08/09/2020 Recommended Consults -- Oral Care Recommendations Oral care BID Other Recommendations --   CHL IP FOLLOW UP RECOMMENDATIONS 08/09/2020 Follow up Recommendations Other (comment)   CHL IP FREQUENCY AND DURATION 08/09/2020 Speech Therapy Frequency (ACUTE ONLY) min 2x/week Treatment Duration 2 weeks      CHL IP ORAL PHASE 08/09/2020 Oral Phase WFL Oral - Pudding Teaspoon -- Oral - Pudding Cup -- Oral - Honey Teaspoon -- Oral - Honey Cup -- Oral - Nectar Teaspoon -- Oral - Nectar Cup -- Oral - Nectar Straw -- Oral - Thin Teaspoon -- Oral - Thin Cup -- Oral - Thin Straw -- Oral - Puree -- Oral - Mech Soft -- Oral - Regular -- Oral - Multi-Consistency -- Oral - Pill -- Oral Phase - Comment --  CHL IP PHARYNGEAL PHASE 08/09/2020 Pharyngeal Phase Impaired Pharyngeal- Pudding Teaspoon -- Pharyngeal -- Pharyngeal- Pudding Cup -- Pharyngeal -- Pharyngeal- Honey Teaspoon -- Pharyngeal --  Pharyngeal- Honey Cup -- Pharyngeal -- Pharyngeal- Nectar Teaspoon -- Pharyngeal -- Pharyngeal- Nectar Cup -- Pharyngeal -- Pharyngeal- Nectar Straw -- Pharyngeal -- Pharyngeal- Thin Teaspoon -- Pharyngeal -- Pharyngeal- Thin Cup Reduced airway/laryngeal closure;Penetration/Aspiration during swallow;Penetration/Apiration after swallow Pharyngeal Material enters airway, CONTACTS cords and not ejected out;Material enters airway, passes BELOW cords without attempt by patient to eject out (silent aspiration);Material enters airway, passes BELOW cords and not ejected out despite cough attempt by patient Pharyngeal- Thin Straw Reduced airway/laryngeal closure;Penetration/Apiration after swallow;Penetration/Aspiration during swallow Pharyngeal Material enters airway, CONTACTS cords and not ejected out;Material enters airway, passes BELOW cords without attempt by patient to eject out (silent aspiration);Material enters airway, passes BELOW cords and not ejected out despite cough attempt by patient Pharyngeal- Puree -- Pharyngeal -- Pharyngeal- Mechanical Soft Delayed swallow initiation-pyriform sinuses Pharyngeal -- Pharyngeal- Regular -- Pharyngeal -- Pharyngeal- Multi-consistency -- Pharyngeal -- Pharyngeal- Pill -- Pharyngeal -- Pharyngeal Comment --  CHL IP CERVICAL ESOPHAGEAL PHASE 08/09/2020 Cervical Esophageal Phase (No Data) Pudding Teaspoon -- Pudding Cup -- Honey Teaspoon -- Honey Cup -- Nectar Teaspoon -- Nectar Cup -- Nectar Straw -- Thin Teaspoon -- Thin Cup -- Thin Straw -- Puree -- Mechanical Soft -- Regular -- Multi-consistency -- Pill -- Cervical Esophageal Comment -- Shanika I. Hardin Negus, [place], [place] Office number [phone] Pager [place] 08/09/2020, 2:08 PM              Scheduled Meds: . allopurinol  300 mg Oral Daily  . amLODipine  2.5 mg Oral Daily  . atorvastatin  20 mg Oral Daily  . cholecalciferol  2,000 Units Oral Daily  . donepezil  5 mg  Oral QHS  . enoxaparin (LOVENOX) injection  40 mg Subcutaneous Q24H  . ferrous sulfate  325 mg Oral Q M,W,F  . magnesium oxide  400 mg Oral BID  . megestrol  400 mg Oral Daily  . mometasone-formoterol  2 puff Inhalation BID  . multivitamin with minerals  1 tablet Oral Daily  . nicotine  21 mg Transdermal Daily  . pantoprazole sodium  40 mg Oral BID AC  . potassium chloride SA  20 mEq Oral BID  . QUEtiapine  25 mg Oral BID  . senna-docusate  1 tablet Oral QHS  . sodium chloride flush  3 mL Intravenous Q12H  . sucralfate  1 g Oral QID  . cyanocobalamin  1,000 mcg Oral Daily   Continuous Infusions: . sodium chloride    . sodium chloride 75 mL/hr at 08/09/20 1232     LOS: 3  days    Kerney Elbe, DO [facility] PAGER is on AMION  If 7PM-7AM, please contact night-coverage [email or web address]

## 2020-08-09 NOTE — Progress Notes (Signed)
  Speech Language Pathology Treatment: Dysphagia  Patient Details Name: John [name] MRN: [id number] DOB: 01/21/1946 Today's Date: 08/09/2020 Time: 1015-1027 SLP Time Calculation (min) (ACUTE ONLY): 12 min  Assessment / Plan / Recommendation Clinical Impression  Pt was seen for dysphagia treatment. Speech intelligibility was significantly improved compared to yesterday and responses were mostly related to the conversational topics. He continues to be symptomatic of aspiration with thin liquids and inconsistently with regular texture solids. Coughing was also intermittently noted between swallows of puree solids. Pt does present with a cough at baseline; however, aspiration is also suspected. Mastication time was [name] and no significant oral residue was noted. A modified barium swallow study is still recommended and is currently scheduled for today at 1300.    HPI HPI: Pt is a 74 y.o. male with medical history significant of HTN, HLD, COPD, CHF, and squamous cell carcinoma of the right lung who presented to the ED with syncope. CXR 8/8: Increased airspace filling opacities in the right lower lobe, suggesting infectious infiltrate in addition to known right lower lobe mass. CT head negative for acute changes. CT chest 8/8: Increasing masslike airspace opacity in the right lower lobe. Previously, the central mass could be visualized, surrounded by airspace disease. Increasing size of small right pleural effusion. SLP consulted due to pt demonstrating coughing when eating.       SLP Plan  MBS;New goals to be determined pending instrumental study       Recommendations  Diet recommendations: Dysphagia 1 (puree);Nectar-thick liquid Liquids provided via: Cup;Straw Medication Administration: Crushed with puree Supervision: Staff to assist with self feeding Compensations: Slow rate;Small sips/bites;Follow solids with liquid                Oral Care Recommendations: Oral care BID Follow up  Recommendations: Other (comment) (TBD) SLP Visit Diagnosis: Dysphagia, oropharyngeal phase (R13.12) Plan: MBS;New goals to be determined pending instrumental study       Gaia Gullikson I. Hardin Negus, [place], [place] Office number [phone] Pager [place] 08/09/2020, 10:28 AM

## 2020-08-09 NOTE — Progress Notes (Addendum)
Modified Barium Swallow Progress Note  Patient Details  Name: John [name] MRN: [id number] Date of Birth: 11/09/1946  Today's Date: 08/09/2020  Modified Barium Swallow completed.  Full report located under Chart Review in the Imaging Section.  Brief recommendations include the following:  Clinical Impression  Pt's study was limited due to pt's refusal to accept solid trials and subsequent agitation following encouragement from staff and his family. Pt became increasingly agitated as the session progressed. He ultimately unstrapped himself from the swallow chair and was physically resistant to staff's efforts to assist him back to the chair. Pt walked around the radiology suite with support from SLP, his niece, and multiple radiology techs while voicing his intent to leave the hospital and "get a cigarette". Pt's agitation reduced with some intervention from his niece and he was ultimately amenable to sitting a wheelchair. Security was contacted and pt was escorted back to his room with transport, three security gurards, and the pt's niece.   Pt presented with pharyngeal dysphagia characterized by a pharyngeal delay, and reduced length of laryngeal vestibule closure which resulted in deep penetration (PAS 5) and ultimate aspiration (PAS 7, 8) with inconsistent sensation. A dysphagia 3 diet with nectar thick liquids is recommended at this time. SLP will continue to follow pt.    Swallow Evaluation Recommendations       SLP Diet Recommendations: Dysphagia 3 (Mech soft) solids;Nectar thick liquid   Liquid Administration via: Cup;Straw   Medication Administration: Crushed with puree   Supervision: Patient able to self feed   Compensations: Slow rate;Small sips/bites   Postural Changes: Remain semi-upright after after feeds/meals (Comment);Seated upright at 90 degrees   Oral Care Recommendations: Oral care BID       Dray Dente I. Hardin Negus, [place], [place] Office number [phone] Pager [phone]  Horton Marshall 08/09/2020,2:06 PM

## 2020-08-09 NOTE — TOC Initial Note (Signed)
Transition of Care [facility]) - Initial/Assessment Note    Patient Details  Name: NACHMEN MANSEL MRN: [id number] Date of Birth: [birth year]  Transition of Care [facility]) CM/SW Contact:    Trula Ore, [place] Phone Number: 08/09/2020, 5:37 PM  Clinical Narrative:                  CSW spoke with patients daughter Willette Cluster who agreed to SNF placement for patient. Patients daughter gave CSW permission to fax out initial referral to [facility] and [facility] area. Patients daughter says patient has had his covid vaccines.  Pending bed offers. CSW will start insurance authorization for patient.  CSW will continue to follow.  Expected Discharge Plan: Skilled Nursing Facility Barriers to Discharge: Continued Medical Work up   Patient Goals and CMS Choice Patient states their goals for this hospitalization and ongoing recovery are:: SNF   Choice offered to / list presented to : Adult Children Willette Cluster)  Expected Discharge Plan and Services Expected Discharge Plan: [place] arrangements for the past 2 months: Single Family Home                                      Prior Living Arrangements/Services Living arrangements for the past 2 months: Single Family Home Lives with:: Self, Adult Children Willette Cluster) Patient language and need for interpreter reviewed:: Yes Do you feel safe going back to the place where you live?: No   SNF  Need for Family Participation in Patient Care: Yes (Comment) Care giver support system in place?: Yes (comment)   Criminal Activity/Legal Involvement Pertinent to Current Situation/Hospitalization: No - Comment as needed  Activities of Daily Living      Permission Sought/Granted Permission sought to share information with : Case Manager, Family Supports, Investment banker, corporate granted to share info w AGENCY: SNF        Emotional Assessment       Orientation: : Oriented to Self Alcohol  / Substance Use: Not Applicable Psych Involvement: No (comment)  Admission diagnosis:  Syncope and collapse [R55] Patient Active Problem List   Diagnosis Date Noted  . Sinus node dysfunction (HCC)   . FTT (failure to thrive) in adult   . Iron deficiency anemia   . ARF (acute renal failure) ([place]) 09/02/2019  . Hypomagnesemia 09/02/2019  . Acute pain of left knee   . Acute metabolic encephalopathy 69/62/9528  . Goals of care, counseling/discussion 08/05/2019  . Acute renal failure (ARF) ([place]) 07/08/2019  . Hypokalemia 07/08/2019  . Vascular dementia without behavioral disturbance ([place])   . Helicobacter pylori gastritis   . Chronic duodenal ulcer with hemorrhage   . Multiple gastric ulcers   . Acute esophagitis   . Gastrointestinal hemorrhage 04/20/2019  . Vitamin B12 deficiency 04/20/2019  . Folate deficiency 04/20/2019  . Dehydration 04/20/2019  . Hypotension 04/20/2019  . Melena   . Acute blood loss anemia   . CKD (chronic kidney disease), stage III 04/08/2019  . Right lower lobe lung mass 04/08/2019  . Syncope and collapse 04/08/2019  . Symptomatic anemia 04/08/2019  . Occult GI bleeding 04/08/2019  . Hyperglycemia 04/08/2019  . Leukocytosis 04/08/2019  . Syncope 04/08/2019  . Low hemoglobin   . Dyspnea on exertion 08/25/2017  . Bursitis of elbow 03/15/2016  . Obesity (BMI 30-39.9) 12/03/2015  . Primary cancer  of right lower lobe of lung ([place]) 06/14/2015  . Solitary pulmonary nodule 03/13/2015  . RVF (right ventricular failure) ([place]) 11/04/2014  . COPD II/III with reversibility  12/19/2012  . Chronic respiratory failure with hypoxia ([place]) 12/18/2012  . Drug-induced hyperglycemia 12/08/2012  . Allergic drug rash 12/06/2012  . Pulmonary HTN ([place]) 03/18/2012  . Abnormal echocardiogram 03/18/2012  . LV dysfunction 03/18/2012  . COPD exacerbation ([place]) 02/22/2012  . Chronic diastolic CHF (congestive heart failure) ([place]) 02/22/2012  . Diabetes mellitus ([place]) 02/22/2012   . HTN (hypertension) 02/22/2012  . CAD (coronary artery disease) 02/22/2012  . Acute on chronic systolic heart failure ([place]) 02/22/2012   PCP:  [name], NP Pharmacy:   [place], [place] - [address] [place] [address] Phone: [phone] Fax: [phone]     Social Determinants of Health (SDOH) Interventions    Readmission Risk Interventions Readmission Risk Prevention Plan 07/09/2019 04/30/2019 04/30/2019  Transportation Screening Complete - Complete  Medication Review (RN Care Manager) Complete - Complete  PCP or Specialist appointment within 3-5 days of discharge - Complete -  [place] or Home Care Consult Complete - Complete  SW Recovery Care/Counseling Consult Complete - -  Palliative Care Screening Not Applicable - Not [place] Not Applicable - Not Applicable  Some recent data might be hidden

## 2020-08-10 ENCOUNTER — Encounter (HOSPITAL_COMMUNITY): Payer: Self-pay | Admitting: Internal Medicine

## 2020-08-10 LAB — RETICULOCYTES
Immature Retic Fract: 11.2 % (ref 2.3–15.9)
RBC.: 2.55 MIL/uL — ABNORMAL LOW (ref 4.22–5.81)
Retic Count, Absolute: 51.8 10*3/uL (ref 19.0–186.0)
Retic Ct Pct: 2 % (ref 0.4–3.1)

## 2020-08-10 LAB — COMPREHENSIVE METABOLIC PANEL
ALT: 11 U/L (ref 0–44)
AST: 13 U/L — ABNORMAL LOW (ref 15–41)
Albumin: 2.7 g/dL — ABNORMAL LOW (ref 3.5–5.0)
Alkaline Phosphatase: 58 U/L (ref 38–126)
Anion gap: 7 (ref 5–15)
BUN: 18 mg/dL (ref 8–23)
CO2: 33 mmol/L — ABNORMAL HIGH (ref 22–32)
Calcium: 8.9 mg/dL (ref 8.9–10.3)
Chloride: 101 mmol/L (ref 98–111)
Creatinine, Ser: 1.42 mg/dL — ABNORMAL HIGH (ref 0.61–1.24)
GFR calc Af Amer: 56 mL/min — ABNORMAL LOW (ref >60)
GFR calc non Af Amer: 48 mL/min — ABNORMAL LOW (ref >60)
Glucose, Bld: 104 mg/dL — ABNORMAL HIGH (ref 70–99)
Potassium: 4.9 mmol/L (ref 3.5–5.1)
Sodium: 141 mmol/L (ref 135–145)
Total Bilirubin: 0.6 mg/dL (ref 0.3–1.2)
Total Protein: 6 g/dL — ABNORMAL LOW (ref 6.5–8.1)

## 2020-08-10 LAB — CBC WITH DIFFERENTIAL/PLATELET
Abs Immature Granulocytes: 0.02 10*3/uL (ref 0.00–0.07)
Basophils Absolute: 0 10*3/uL (ref 0.0–0.1)
Basophils Relative: 0 %
Eosinophils Absolute: 0.2 10*3/uL (ref 0.0–0.5)
Eosinophils Relative: 4 %
HCT: 26.1 % — ABNORMAL LOW (ref 39.0–52.0)
Hemoglobin: 8.1 g/dL — ABNORMAL LOW (ref 13.0–17.0)
Immature Granulocytes: 0 %
Lymphocytes Relative: 8 %
Lymphs Abs: 0.5 10*3/uL — ABNORMAL LOW (ref 0.7–4.0)
MCH: 30.7 pg (ref 26.0–34.0)
MCHC: 31 g/dL (ref 30.0–36.0)
MCV: 98.9 fL (ref 80.0–100.0)
Monocytes Absolute: 0.7 10*3/uL (ref 0.1–1.0)
Monocytes Relative: 12 %
Neutro Abs: 4.8 10*3/uL (ref 1.7–7.7)
Neutrophils Relative %: 76 %
Platelets: 232 10*3/uL (ref 150–400)
RBC: 2.64 MIL/uL — ABNORMAL LOW (ref 4.22–5.81)
RDW: 17.9 % — ABNORMAL HIGH (ref 11.5–15.5)
WBC: 6.3 10*3/uL (ref 4.0–10.5)
nRBC: 0 % (ref 0.0–0.2)

## 2020-08-10 LAB — BASIC METABOLIC PANEL
Anion gap: 8 (ref 5–15)
BUN: 16 mg/dL (ref 8–23)
CO2: 31 mmol/L (ref 22–32)
Calcium: 8.9 mg/dL (ref 8.9–10.3)
Chloride: 102 mmol/L (ref 98–111)
Creatinine, Ser: 1.28 mg/dL — ABNORMAL HIGH (ref 0.61–1.24)
GFR calc Af Amer: >60 mL/min (ref >60)
GFR calc non Af Amer: 54 mL/min — ABNORMAL LOW (ref >60)
Glucose, Bld: 125 mg/dL — ABNORMAL HIGH (ref 70–99)
Potassium: 4.7 mmol/L (ref 3.5–5.1)
Sodium: 141 mmol/L (ref 135–145)

## 2020-08-10 LAB — FERRITIN: Ferritin: 40 ng/mL (ref 24–336)

## 2020-08-10 LAB — RPR: RPR Ser Ql: NONREACTIVE

## 2020-08-10 LAB — IRON AND TIBC
Iron: 86 ug/dL (ref 45–182)
Saturation Ratios: 30 % (ref 17.9–39.5)
TIBC: 291 ug/dL (ref 250–450)
UIBC: 205 ug/dL

## 2020-08-10 LAB — VITAMIN B12: Vitamin B-12: 748 pg/mL (ref 180–914)

## 2020-08-10 LAB — SARS CORONAVIRUS 2 BY RT PCR (HOSPITAL ORDER, PERFORMED IN ~~LOC~~ HOSPITAL LAB): SARS Coronavirus 2: NEGATIVE

## 2020-08-10 LAB — MAGNESIUM: Magnesium: 1.9 mg/dL (ref 1.7–2.4)

## 2020-08-10 LAB — PHOSPHORUS: Phosphorus: 3.7 mg/dL (ref 2.5–4.6)

## 2020-08-10 LAB — FOLATE: Folate: 30.3 ng/mL (ref >5.9)

## 2020-08-10 MED ORDER — BENZONATATE 100 MG PO CAPS
200.0000 mg | ORAL_CAPSULE | Freq: Two times a day (BID) | ORAL | Status: DC | PRN
  Administered 2020-08-10: 200 mg via ORAL
  Filled 2020-08-10: qty 2

## 2020-08-10 MED ORDER — GUAIFENESIN ER 600 MG PO TB12
1200.0000 mg | ORAL_TABLET | Freq: Two times a day (BID) | ORAL | Status: DC
  Administered 2020-08-10 – 2020-08-12 (×4): 1200 mg via ORAL
  Filled 2020-08-10 (×4): qty 2

## 2020-08-10 MED ORDER — SODIUM CHLORIDE 0.9 % IV SOLN: INTRAVENOUS | Status: AC

## 2020-08-10 MED ORDER — HEPARIN SODIUM (PORCINE) 5000 UNIT/ML IJ SOLN
5000.0000 [IU] | Freq: Three times a day (TID) | INTRAMUSCULAR | Status: DC
  Administered 2020-08-10 – 2020-08-11 (×3): 5000 [IU] via SUBCUTANEOUS
  Filled 2020-08-10 (×4): qty 1

## 2020-08-10 NOTE — TOC Progression Note (Addendum)
Transition of Care [facility]) - Progression Note    Patient Details  Name: John [name] MRN: [id number] Date of Birth: 06/21/1946  Transition of Care [facility]) CM/SW [place], LCSW Phone Number: 08/10/2020, 1:34 PM  Clinical Narrative:    1:34pm-CSW spoke with patient's daughter and provided SNF bed offers. She is in agreement with [facility]. CSW waiting to hear back from [facility] on if they still have beds available.   2:24pm-[facility] confirmed they do have a bed available for patient. CSW submitted insurance clinicals for review and updated patient's daughter.   4pm-[facility] requested COVID vaccine dates. CSW spoke with patient's daughter and she requested CSW contact pt's MD office to confirm. CSW contacted [facility]. They reported that patient only received his first dose of Moderna on 05/02/20 and did not return for the second dose. Patient's daughter states that patient would not have gone anywhere else to get the second dose. SNF is requesting that patient received the second dose before he discharges from [facility]. Daughter in agreement. MD approved vaccine. CSW reached out to [place] vaccine group to see if it can be arranged prior to discharge.    Expected Discharge Plan: Skilled Nursing Facility Barriers to Discharge: Continued Medical Work up  Expected Discharge Plan and Services Expected Discharge Plan: [place]       Living arrangements for the past 2 months: Single Family Home                                       Social Determinants of Health (SDOH) Interventions    Readmission Risk Interventions Readmission Risk Prevention Plan 07/09/2019 04/30/2019 04/30/2019  Transportation Screening Complete - Complete  Medication Review Press photographer) Complete - Complete  PCP or Specialist appointment within 3-5 days of discharge - Complete -  [place] or Home Care Consult  Complete - Complete  SW Recovery Care/Counseling Consult Complete - -  Palliative Care Screening Not Applicable - Not [place] Not Applicable - Not Applicable  Some recent data might be hidden

## 2020-08-10 NOTE — Progress Notes (Signed)
Following tele.   No further pause. No further syncope from notes.   Plan Zio patch and 1 month follow up on discharge. Will continue to follow. I am not sure we can place Zios on the weekend, but will clarify in the event he goes home/to SNF over the weekend.   Legrand Como [address] [place], [place]  08/10/2020 7:32 AM

## 2020-08-10 NOTE — Evaluation (Signed)
Occupational Therapy Evaluation Patient Details Name: John [name] MRN: [id number] DOB: April 26, 1946 Today's Date: 08/10/2020    History of Present Illness Pt is a 73 y.o. male with medical history significant of HTN, HLD, COPD, CHF, and squamous cell carcinoma of the right lung who presented to the ED with syncope. CXR 8/8: Increased airspace filling opacities in the right lower lobe, suggesting infectious infiltrate in addition to known right lower lobe mass. CT head negative for acute changes. CT chest 8/8: Increasing masslike airspace opacity in the right lower lobe. Previously, the central mass could be visualized, surrounded by airspace disease. Increasing size of small right pleural effusion.   Clinical Impression   Patient presents with dx. above and deficits listed below requiring supervision to Min A grossly for BADLs, functional transfers and mobility. Due to deficits including generalized weakness, confusion, decreased standing balance, and decreased activity tolerance, patient would benefit from SNF rehab to maximize independence and safety with BADLs. OT will continue to follow acutely.     Follow Up Recommendations  SNF    Equipment Recommendations  Other (comment) (Defer to next level of care)    Recommendations for Other Services       Precautions / Restrictions Precautions Precautions: Fall Precaution Comments: Behaviors  Restrictions Weight Bearing Restrictions: No      Mobility Bed Mobility Overal bed mobility: Needs Assistance Bed Mobility: Supine to Sit;Sit to Supine     Supine to sit: Independent Sit to supine: Independent   General bed mobility comments: HOB flat for supine <> EOB  Transfers Overall transfer level: Needs assistance Equipment used: Rolling walker (2 wheeled) Transfers: Sit to/from Stand Sit to Stand: Min assist;Min guard         General transfer comment: Min guard for STS from low commode in bathroom.     Balance Overall  balance assessment: Needs assistance Sitting-balance support: No upper extremity supported;Feet supported Sitting balance-Leahy Scale: Fair     Standing balance support: Bilateral upper extremity supported;During functional activity Standing balance-Leahy Scale: Poor Standing balance comment: Reliant on UE                           ADL either performed or assessed with clinical judgement   ADL Overall ADL's : Needs assistance/impaired     Grooming: Supervision/safety;Cueing for sequencing;Cueing for safety           Upper Body Dressing : Supervision/safety;Cueing for sequencing;Cueing for safety   Lower Body Dressing: Minimal assistance;Cueing for safety;Cueing for sequencing   Toilet Transfer: Min guard;Cueing for safety;Cueing for sequencing Toilet Transfer Details (indicate cue type and reason): Ambulation to commode in bathroom with Min guard and cues for safety and line management. Patient very impulsive and declining assist despite education.  Toileting- Clothing Manipulation and Hygiene: Min guard Toileting - Clothing Manipulation Details (indicate cue type and reason): Min guard for toileting/hygiene/clothing management in sitting/standing on standard commode.      Functional mobility during ADLs: Min guard;Minimal assistance;Cueing for safety General ADL Comments: Min guard to Min A for short distance ambulation to commode in bathroom.      Vision         Perception     Praxis      Pertinent Vitals/Pain Pain Assessment: No/denies pain     Hand Dominance Right   Extremity/Trunk Assessment Upper Extremity Assessment Upper Extremity Assessment: Difficult to assess due to impaired cognition   Lower Extremity Assessment Lower Extremity Assessment: Defer to  PT evaluation   Cervical / Trunk Assessment Cervical / Trunk Assessment: Normal   Communication Communication Communication: No difficulties   Cognition Arousal/Alertness:  Awake/alert Behavior During Therapy: Impulsive;Restless;Agitated Overall Cognitive Status: History of cognitive impairments - at baseline Area of Impairment: Orientation;Attention;Memory;Following commands;Safety/judgement;Awareness;Problem solving                 Orientation Level: Disoriented to;Place;Time;Situation (I am at my daughters house) Current Attention Level: Focused Memory: Decreased short-term memory Following Commands: Follows one step commands inconsistently;Follows one step commands with increased time Safety/Judgement: Decreased awareness of safety;Decreased awareness of deficits   Problem Solving: Slow processing;Requires verbal cues;Decreased initiation General Comments: Patient very agitated met walking to bathroom with 1:1 sitter. Patient cursing at this therapist despite education of therapists purpose in room.    General Comments  Patient very agitated throughout evaluation cursing and balling fist at this therapist.     Exercises     Shoulder Instructions      Home Living Family/patient expects to be discharged to:: Private residence   Available Help at Discharge: Family;Available PRN/intermittently Type of Home: House Home Access: Stairs to enter [employer] of Steps: 1 Entrance Stairs-Rails: None Home Layout: One level     Bathroom Shower/Tub: Occupational psychologist: Standard     Home Equipment: Environmental consultant - 2 wheels;Cane - single point;Bedside commode          Prior Functioning/Environment Level of Independence: Independent with assistive device(s)        Comments: has not been using RW consistently, but uses BSC. Dtr helps manage IADLs        OT Problem List: Decreased strength;Decreased activity tolerance;Impaired balance (sitting and/or standing);Decreased cognition;Decreased safety awareness;Decreased knowledge of use of DME or AE;Decreased knowledge of precautions      OT Treatment/Interventions:  Self-care/ADL training;Therapeutic exercise;Energy conservation;DME and/or AE instruction;Therapeutic activities;Patient/family education;Balance training    OT Goals(Current goals can be found in the care plan section) Acute Rehab OT Goals Patient Stated Goal: No goals stated OT Goal Formulation: Patient unable to participate in goal setting Time For Goal Achievement: 08/24/20 Potential to Achieve Goals: Good ADL Goals Additional ADL Goal #1: Patient will complete 3/3 grooming tasks standing at sink level with 1-3 cues and GOOD safety awareness. Additional ADL Goal #2: Patient will demonstrate orientation to person, place, and time with use of environmental cues including clock and calendar.  OT Frequency: Min 2X/week   Barriers to D/C:            Co-evaluation              AM-PAC OT "6 Clicks" Daily Activity     Outcome Measure Help from another person eating meals?: A Little Help from another person taking care of personal grooming?: A Little Help from another person toileting, which includes using toliet, bedpan, or urinal?: A Little Help from another person bathing (including washing, rinsing, drying)?: A Little Help from another person to put on and taking off regular upper body clothing?: A Little Help from another person to put on and taking off regular lower body clothing?: A Little 6 Click Score: 18   End of Session Equipment Utilized During Treatment: Oxygen  Activity Tolerance: Treatment limited secondary to agitation Patient left: in bed;with bed alarm set;with nursing/sitter in room;with call bell/phone within reach  OT Visit Diagnosis: Unsteadiness on feet (R26.81);Muscle weakness (generalized) (M62.81);History of falling (Z91.81)                Time:  9872-1587 OT Time Calculation (min): 8 min Charges:  OT General Charges $OT Visit: 1 Visit OT Evaluation $OT Eval Moderate Complexity: 1 Mod  Shelise Maron H. OTR/L Supplemental OT, Department of rehab services  [phone]  Malky Rudzinski R H. 08/10/2020, 1:01 PM

## 2020-08-10 NOTE — Progress Notes (Signed)
  Speech Language Pathology Treatment: Dysphagia  Patient Details Name: John [name] MRN: [id number] DOB: 01-18-1946 Today's Date: 08/10/2020 Time: 5051-8335 SLP Time Calculation (min) (ACUTE ONLY): 14 min  Assessment / Plan / Recommendation Clinical Impression  Pt was seen for dysphagia treatment. He was alert, pleasant and cooperative throughout the session. Pt presented with a cough at baseline. He was seen during breakfast and consumed a meal of scrambled eggs, a muffin, and nectar thick liquids via cup. He required cues to reduce bolus size and inconsistently exhibited coughing following intake of large boluses and when a liquid was added to partially masticated larger boluses, suggesting possible aspiration. However, considering his baseline coughing, the possibility of it being unrelated is also considered. It is recommended that the pt's current diet be continued and SLP will continue to follow pt.    HPI HPI: Pt is a 74 y.o. male with medical history significant of HTN, HLD, COPD, CHF, and squamous cell carcinoma of the right lung who presented to the ED with syncope. CXR 8/8: Increased airspace filling opacities in the right lower lobe, suggesting infectious infiltrate in addition to known right lower lobe mass. CT head negative for acute changes. CT chest 8/8: Increasing masslike airspace opacity in the right lower lobe. Previously, the central mass could be visualized, surrounded by airspace disease. Increasing size of small right pleural effusion. SLP consulted due to pt demonstrating coughing when eating.       SLP Plan  MBS;New goals to be determined pending instrumental study       Recommendations  Diet recommendations: Dysphagia 3 (mechanical soft);Nectar-thick liquid Liquids provided via: Cup;Straw Medication Administration: Crushed with puree Supervision: Staff to assist with self feeding Compensations: Slow rate;Small sips/bites;Follow solids with liquid Postural  Changes and/or Swallow Maneuvers: Seated upright 90 degrees                Oral Care Recommendations: Oral care BID Follow up Recommendations: Other (comment) (TBD) SLP Visit Diagnosis: Dysphagia, oropharyngeal phase (R13.12) Plan: MBS;New goals to be determined pending instrumental study       Charon Smedberg I. Hardin Negus, [place], [place] Office number [phone] Pager [place] 08/10/2020, 8:57 AM

## 2020-08-10 NOTE — Progress Notes (Signed)
PROGRESS NOTE    John [name]  NUU:[id number] DOB: 1946-10-26 DOA: 08/06/2020 PCP: [name], NP   Brief Narrative:  HPI per Dr. Adella Hare on 08/06/20 John [name] is a 74 y.o. male with medical history significant of HTN, HLD, COPD, CHF, and squamous cell carcinoma of the right lung presents to the ED with syncope while working in the yard today.Per EMS the patient was working in the yard with his son which is not unusual. The son apparently witnessed the patient collapsed to the ground and lose consciousness.He felt mainly to his knees and there was no reported head injury or trauma. Fire arrived on scene and reported that the patient was hypoxic to the 80s.They started nonrebreather and when EMS arrived they were able to titrate to nasal cannula currently at 1 L.No witnessed seizure activity. The patient does not recall the event. He does not recall feeling badly prior to this. He states that overall his energy has been good, he has been eating well, compliant with his medications.No recent medication changes. Patient is currently followed by palliative care but not in the hospice program. Review of recent Oncology note mentions patient is on home O2 but not apparently wearing this today.  As patient was walking out of dept after being discharged had a witnessed syncopal episode with collapse with respiratory distress requiring brief assistant. He was noted to have non-sustained V-Tach. Due to two episodes of syncope he is referred to [facility] for tele admission for possible arrythmogenic syncope. Cardiology is to see patient in the ED.    Patient had previous admission for syncope and collapse 2020 with negative EEG and neuro consult - seizure ruled out.   ED Course: afebrile, 143/68, HR 91, RR 22 O2 sat 95%. Lab results significant for glucose 123, Cr 1.38-at his baseline, BNP 209.9, Troponin #1 6, #2 9. Hgb 8.4 '@1311'  hras, 10.9 @ 1716 hrs. CTA chest negative for  PE, RLL mass enlarged. Due to outpatient syncope and witnessed syncope in the ED he is referred to [facility] for tele admission to monitor for arrythmia.  Review of Systems: As per HPI otherwise 10 point review of systems negative. Caveat - patient has very poor short term memory.  **Interim History His beta-blockers were held and he has had no further bradycardia.  The EP team recommends to see as needed and they can arrange a zio on discharge.  Medical cardiology recommends continue to hold beta-blocker and will follow up as an outpatient as well.  PT OT recommending SNF.  Patient was having some dysphagia yesterday and SLP recommending dysphagia 3 diet with nectar thick liquids.  He was extremely agitated and confused today but he does have a history of dementia and family states that he does have some behavioral disturbances but not this bad. Patient had a Air cabin crew but we have discontinued his now. PT OT recommending skilled nursing facility and patient's family is agreeable for SNF placement so safety sitter has been discontinued and will empirically obtain a repeat Covid test.   Assessment & Plan:   Active Problems:   Chronic diastolic CHF (congestive heart failure) (HCC)   Diabetes mellitus (HCC)   HTN (hypertension)   COPD II/III with reversibility    Primary cancer of right lower lobe of lung (HCC)   CKD (chronic kidney disease), stage III   Syncope and collapse   Vascular dementia without behavioral disturbance (HCC)   Sinus node dysfunction (HCC)   Syncope and Collapse - has  had multiple episodes and at least one admission for this problem.  - Neurologic etiology has been ruled out: negative EEG, neuro consult opinion.  - CT brain without acute changes or mets. Suspect cardiogenic etiology. - Cards/EP: seeing sinsus brady and excessive vagal tone; holding BB  - no further syncopal episodes; patient to continue holding BB, place zio patch at discharge; appreciate cards/EP  assistance -PT/OT and recommending SNF and family agreeable   Cough - ?dyspahgia; coughing w/ drinking/eating per nursing - weak cough and fair amount of upper airway transmission today - NPO, except meds - SLP consult done and showed an MBS with dysphagia 3 diet with nectar thick liquids -Was on O2 this AM -Currently in no Respiratory Distress  Chronic HFpEF  - mild elevation of BNP. Does not appear decompensated.  - Last Echo Dec '20 with EF 60-65% and impaired relaxtion. - Echo 8/9: Left ventricular ejection fraction, by estimation, is 60 to 65%. The left ventricle has normal function. The left ventricle has no regional wall motion abnormalities. Left ventricular diastolic parameters are consistent with Grade I diastolic dysfunction (impaired relaxation). -Patient is +2.460 Liters since admission and getting gentle IVF hydration  Controlled DMt2  - on no medication.  - Serum glucose with mild elevation. CBGs ranging from 92-125 - A1c: 4.9  Progressive NSCCA RLL with increased mass size  - seen on CTA.  - Pt losing wt. - Follow up with Dr. Earlie Server outpt.   COPD  Chronic Respiratory Failure with Hypoxia  - By records was on home O2 at one time and is on O2 now in the setting of COPD and Lung Cancer - CXR suggests possible infiltrate but CTA does not reveal any infiltrate.  - He was given Ceftriaxone and Azithromycin in ED but is afebrile and no leukcytosis, no cough or sputum production, no hypoxemia. - holding further abx -SpO2: 91 % O2 Flow Rate (L/min): 2 L/min - C/wdulera, PRN albuterol - O2 support; wean as able  Dementia with Behavioral Disturbances -C/w aricept, seroquel -Delrium Precautions -Haldol for Agitation -1:1 Sitter now removed now to facilitate D/C to SNF  -May need to have MRI to rule out metastasis to the brain given his significant agitation but he is improved today  -Will consider a Palliative Consult  HLD -Atorvastatin  HTN -C/w  Amlodipine 2.5 mg p.o. daily  AKI -baseline SCr 1.0 - 1.1 -Scr at admission 1.4; had originally resolved but will resume IVF given worsened Renal Fxn; Cr was 1.44 this AM and repeat this AM is 1.28 -Started back on IV fluid hydration with normal saline at 75 mL's per hour but will stop later.  -Avoid nephrotoxic medications, contrast dyes, hypotension and renally adjust medications -Repeat CMP in a.m.  Normocytic Anemia -Patient's Hgb/Hct went from 8.5/28.2 -> 8.3/27.4 -> 8.1/26.1 -Check Anemia Panel  -Continue to Monitor for S/Sx of Bleeding -Currently no overt Bleeding noted  DVT prophylaxis: Enoxaparin 40 mg subcu every 24h Code Status: FULL CODE  Family Communication: Discussed with Daughter over the Telephone Disposition Plan: SNF in the next 24-48 hours   Status is: Inpatient  Remains inpatient appropriate because:Altered mental status and Inpatient level of care appropriate due to severity of illness   Dispo: The patient is from: Home              Anticipated d/c is to: SNF              Anticipated d/c date is: 1 day  Patient currently is medically stable to d/c.  Consultants:   EP Cardiology  Cardiology    Procedures: None  Antimicrobials:  Anti-infectives (From admission, onward)   Start     Dose/Rate Route Frequency Ordered Stop   08/06/20 1400  cefTRIAXone (ROCEPHIN) 1 g in sodium chloride 0.9 % 100 mL IVPB        1 g 200 mL/hr over 30 Minutes Intravenous  Once 08/06/20 1358 08/06/20 1651   08/06/20 1400  azithromycin (ZITHROMAX) 500 mg in sodium chloride 0.9 % 250 mL IVPB        500 mg 250 mL/hr over 60 Minutes Intravenous  Once 08/06/20 1358 08/06/20 1651   08/06/20 0000  amoxicillin (AMOXIL) 500 MG capsule     Discontinue     500 mg Oral 3 times daily 08/06/20 1649     08/06/20 0000  azithromycin (ZITHROMAX) 250 MG tablet     Discontinue     250 mg Oral Daily 08/06/20 1649       Subjective: Seen and examined at bedside and he was  calmer today and he knew that he was in [facility] and knew his name and her birthday.  Did not realize that he had syncope and collapse.  No nausea or vomiting.  Was much more calmer today compared to yesterday from nursing staff.  No other concerns or complaints at this time.  Daughter elected for patient to go to SNF.  No other concerns or complaints at this time and he is currently medically stable to be discharged and follow-up in outpatient setting with cardiology.  Objective: Vitals:   08/09/20 2100 08/10/20 0412 08/10/20 0725 08/10/20 1159  BP:  (!) 168/64  (!) 165/68  Pulse:  (!) 57  77  Resp:  19  17  Temp:  97.7 F (36.5 C)  98.3 F (36.8 C)  TempSrc:  Oral  Oral  SpO2: (!) 85% 100% 98% 91%  Weight:  66.4 kg    Height:        Intake/Output Summary (Last 24 hours) at 08/10/2020 1339 Last data filed at 08/10/2020 1216 Gross per 24 hour  Intake 1540 ml  Output --  Net 1540 ml   Filed Weights   08/08/20 0256 08/09/20 0535 08/10/20 0412  Weight: 65.2 kg 69.3 kg 66.4 kg   Examination: Physical Exam:  Constitutional: WN/WD slightly overweight African-American male currently in no acute distress appears calm and remains a little confused Eyes: Lids and conjunctivae normal, sclerae anicteric  ENMT: External Ears, Nose appear normal. Grossly normal hearing.  Neck: Appears normal, supple, no cervical masses, normal ROM, no appreciable thyromegaly; no JVD Respiratory: Diminished to auscultation bilaterally, no wheezing, rales, rhonchi or crackles. Normal respiratory effort and patient is not tachypenic. No accessory muscle use.  Unlabored breathing Cardiovascular: RRR, no murmurs / rubs / gallops. S1 and S2 auscultated.  Very little extremity edema Abdomen: Soft, non-tender, slightly distended secondary body habitus. Bowel sounds positive.  GU: Deferred. Musculoskeletal: No clubbing / cyanosis of digits/nails. No joint deformity upper and lower extremities.  Skin: No rashes,  lesions, ulcers on a limited skin evaluation. No induration; Warm and dry.  Neurologic: CN 2-12 grossly intact with no focal deficits. Romberg sign and cerebellar reflexes not assessed.  Psychiatric: Impaired judgment and insight. Alert and oriented x 2. Normal mood and appropriate affect.   Data Reviewed: I have personally reviewed following labs and imaging studies  CBC: Recent Labs  Lab 08/06/20 1311 08/06/20 1716 08/08/20 1122 08/09/20  0750 08/10/20 0427  WBC 8.4  --  7.6 8.0 6.3  NEUTROABS 7.2  --  6.2 6.6 4.8  HGB 8.4* 10.9* 8.5* 8.3* 8.1*  HCT 28.0* 32.0* 28.2* 27.4* 26.1*  MCV 99.3  --  100.0 100.0 98.9  PLT 284  --  277 259 426   Basic Metabolic Panel: Recent Labs  Lab 08/06/20 1528 08/06/20 1716 08/07/20 0732 08/08/20 1122 08/09/20 0750 08/10/20 0427 08/10/20 1052  NA  --    < > 141 142 141 141 141  K  --    < > 4.7 4.6 4.5 4.9 4.7  CL  --    < > 99 100 99 101 102  CO2  --   --  33* 33* 33* 33* 31  GLUCOSE  --    < > 79 102* 92 104* 125*  BUN  --    < > '16 14 16 18 16  ' CREATININE  --    < > 1.27* 1.15 1.33* 1.42* 1.28*  CALCIUM  --   --  8.7* 8.8* 9.0 8.9 8.9  MG 1.6*  --  1.9  --  1.7 1.9  --   PHOS  --   --   --  3.4 2.8 3.7  --    < > = values in this interval not displayed.   GFR: Estimated Creatinine Clearance: 43.4 mL/min (A) (by C-G formula based on SCr of 1.28 mg/dL (H)). Liver Function Tests: Recent Labs  Lab 08/06/20 1311 08/08/20 1122 08/09/20 0750 08/10/20 0427  AST 50*  --  11* 13*  ALT 18  --  13 11  ALKPHOS 80  --  63 58  BILITOT 0.7  --  0.8 0.6  PROT 5.7*  --  6.2* 6.0*  ALBUMIN 2.5* 2.6* 2.8* 2.7*   No results for input(s): LIPASE, AMYLASE in the last 168 hours. No results for input(s): AMMONIA in the last 168 hours. Coagulation Profile: No results for input(s): INR, PROTIME in the last 168 hours. Cardiac Enzymes: No results for input(s): CKTOTAL, CKMB, CKMBINDEX, TROPONINI in the last 168 hours. BNP (last 3 results) No  results for input(s): PROBNP in the last 8760 hours. HbA1C: No results for input(s): HGBA1C in the last 72 hours. CBG: Recent Labs  Lab 08/06/20 1709  GLUCAP 97   Lipid Profile: No results for input(s): CHOL, HDL, LDLCALC, TRIG, CHOLHDL, LDLDIRECT in the last 72 hours. Thyroid Function Tests: Recent Labs    08/09/20 1109  TSH 1.761   Anemia Panel: Recent Labs    08/09/20 1109  VITAMINB12 842   Sepsis Labs: Recent Labs  Lab 08/06/20 2138  PROCALCITON <0.10    Recent Results (from the past 240 hour(s))  SARS Coronavirus 2 by RT PCR (hospital order, performed in [facility] hospital lab) Nasopharyngeal Nasopharyngeal Swab     Status: None   Collection Time: 08/06/20  1:28 PM   Specimen: Nasopharyngeal Swab  Result Value Ref Range Status   SARS Coronavirus 2 NEGATIVE NEGATIVE Final    Comment: (NOTE) SARS-CoV-2 target nucleic acids are NOT DETECTED.  The SARS-CoV-2 RNA is generally detectable in upper and lower respiratory specimens during the acute phase of infection. The lowest concentration of SARS-CoV-2 viral copies this assay can detect is 250 copies / mL. A negative result does not preclude SARS-CoV-2 infection and should not be used as the sole basis for treatment or other patient management decisions.  A negative result may occur with improper specimen collection / handling, submission  of specimen other than nasopharyngeal swab, presence of viral mutation(s) within the areas targeted by this assay, and inadequate number of viral copies (<250 copies / mL). A negative result must be combined with clinical observations, patient history, and epidemiological information.  Fact Sheet for Patients:   StrictlyIdeas.no  Fact Sheet for Healthcare Providers: BankingDealers.co.za  This test is not yet approved or  cleared by the [place] FDA and has been authorized for detection and/or diagnosis of SARS-CoV-2 by FDA  under an Emergency Use Authorization (EUA).  This EUA will remain in effect (meaning this test can be used) for the duration of the COVID-19 declaration under Section 564(b)(1) of the Act, 21 U.S.C. section 360bbb-3(b)(1), unless the authorization is terminated or revoked sooner.  Performed at [facility], [place] [address] [place], [place] [address]   Urine culture     Status: None   Collection Time: 08/06/20  2:45 PM   Specimen: Urine, Clean Catch  Result Value Ref Range Status   Specimen Description URINE, CLEAN CATCH  Final   Special Requests NONE  Final   Culture   Final    NO GROWTH Performed at [facility], [place] [address], [place], [place] [address]    Report Status 08/07/2020 FINAL  Final  Culture, blood (routine x 2)     Status: None (Preliminary result)   Collection Time: 08/06/20  3:28 PM   Specimen: BLOOD RIGHT FOREARM  Result Value Ref Range Status   Specimen Description BLOOD RIGHT FOREARM  Final   Special Requests   Final    BOTTLES DRAWN AEROBIC AND ANAEROBIC Blood Culture results may not be optimal due to an inadequate volume of blood received in culture bottles   Culture   Final    NO GROWTH 4 DAYS Performed at [facility], [place] [address] [place], [place] [address]    Report Status PENDING  Incomplete  Culture, blood (routine x 2)     Status: None (Preliminary result)   Collection Time: 08/06/20  3:37 PM   Specimen: BLOOD LEFT FOREARM  Result Value Ref Range Status   Specimen Description BLOOD LEFT FOREARM  Final   Special Requests   Final    BOTTLES DRAWN AEROBIC AND ANAEROBIC Blood Culture results may not be optimal due to an inadequate volume of blood received in culture bottles   Culture   Final    NO GROWTH 4 DAYS Performed at [facility], [place] [address] [place], [place] [address]    Report Status PENDING  Incomplete     RN Pressure Injury Documentation:     Estimated body mass index is 24.36 kg/m as calculated from  the following:   Height as of this encounter: '5\' 5"'  (1.651 m).   Weight as of this encounter: 66.4 kg.  Malnutrition Type:      Malnutrition Characteristics:      Nutrition Interventions:    Radiology Studies: DG Swallowing Func-Speech Pathology  Result Date: 08/09/2020 Objective Swallowing Evaluation: Type of Study: MBS-Modified Barium Swallow Study  Patient Details Name: John [name] MRN: [id number] Date of Birth: [birth year] Today's Date: 08/09/2020 Time: SLP Start Time (ACUTE ONLY): 6578 -SLP Stop Time (ACUTE ONLY): 1321 SLP Time Calculation (min) (ACUTE ONLY): 14 min Past Medical History: Past Medical History: Diagnosis Date . B12 deficiency  . CHF (congestive heart failure) ([place])  . COPD (chronic obstructive pulmonary disease) ([place])  . Diabetes mellitus without complication ([place])  . Folate deficiency  .  Gout  . Hyperlipemia  . Hypertension  . Lung cancer ([place]) dx'd 2011/2017  squamous cell carcinoma of right lower lobe lung . MI (myocardial infarction) ([place])  . Pulmonary hypertension ([place])  Past Surgical History: Past Surgical History: Procedure Laterality Date . APPENDECTOMY   . BIOPSY  04/21/2019  Procedure: BIOPSY;  Surgeon: Gatha Mayer, MD;  Location: [facility];  Service: Endoscopy;; . ESOPHAGOGASTRODUODENOSCOPY (EGD) WITH PROPOFOL N/A 04/21/2019  Procedure: ESOPHAGOGASTRODUODENOSCOPY (EGD) WITH PROPOFOL;  Surgeon: Gatha Mayer, MD;  Location: [facility];  Service: Endoscopy;  Laterality: N/A; . ESOPHAGOGASTRODUODENOSCOPY (EGD) WITH PROPOFOL N/A 04/29/2019  Procedure: ESOPHAGOGASTRODUODENOSCOPY (EGD) WITH PROPOFOL;  Surgeon: Mauri Pole, MD;  Location: [facility];  Service: Endoscopy;  Laterality: N/A; . HEMOSTASIS CLIP PLACEMENT  04/29/2019  Procedure: HEMOSTASIS CLIP PLACEMENT;  Surgeon: Mauri Pole, MD;  Location: [facility];  Service: Endoscopy;;  Clip placed as marker not for bleeding control . IR ANGIOGRAM SELECTIVE EACH ADDITIONAL VESSEL  04/29/2019 . IR  ANGIOGRAM SELECTIVE EACH ADDITIONAL VESSEL  04/29/2019 . IR ANGIOGRAM SELECTIVE EACH ADDITIONAL VESSEL  04/29/2019 . IR ANGIOGRAM VISCERAL SELECTIVE  04/29/2019 . IR EMBO ART  VEN HEMORR LYMPH EXTRAV  INC GUIDE ROADMAPPING  04/29/2019 . IR US GUIDE VASC ACCESS RIGHT  04/29/2019 . LUNG BIOPSY   HPI: Pt is a 74 y.o. male with medical history significant of HTN, HLD, COPD, CHF, and squamous cell carcinoma of the right lung who presented to the ED with syncope. CXR 8/8: Increased airspace filling opacities in the right lower lobe, suggesting infectious infiltrate in addition to known right lower lobe mass. CT head negative for acute changes. CT chest 8/8: Increasing masslike airspace opacity in the right lower lobe. Previously, the central mass could be visualized, surrounded by airspace disease. Increasing size of small right pleural effusion. SLP consulted due to pt demonstrating coughing when eating.  Subjective: Pt's study was limited due to pt's refusal to accept solid trials and subsequent agitation following encouragement from staff and his family. Pt became increasingly agitated as the session progressed. He ultimately unstrapped himself from the swallow chair and was physically resistant to staff's efforts to assist him back to the chair. Pt walked around the radiology suite with support from SLP, his niece, and multiple radiology techs while voicing his intent to leave the hospital and "get a cigarette". Pt's agitation reduced with some intervention from his niece and he was ultimately amenable to sitting a wheelchair. Security was contacted and pt was escorted back to his room with transport, three security gurards, and the pt's niece. Assessment / Plan / Recommendation CHL IP CLINICAL IMPRESSIONS 08/09/2020 Clinical Impression Subjective: Pt's study was limited due to pt's refusal to accept solid trials and subsequent agitation following encouragement from staff and his family. Pt became increasingly agitated as  the session progressed. He ultimately unstrapped himself from the swallow chair and was physically resistant to staff's efforts to assist him back to the chair. Pt walked around the radiology suite with support from SLP, his niece, and multiple radiology techs while voicing his intent to leave the hospital and "get a cigarette". Pt's agitation reduced with some intervention from his niece and he was ultimately amenable to sitting a wheelchair. Security was contacted and pt was escorted back to his room with transport, three security gurards, and the pt's niece. Pt presented with pharyngeal dysphagia characterized by a pharyngeal delay, and reduced length of laryngeal vestibule closure which resulted in deep penetration (PAS 5) and ultimate aspiration (PAS  7, 8) with inconsistent sensation. A dysphagia 3 diet with nectar thick liquids is recommended at this time. SLP will continue to follow pt.  SLP Visit Diagnosis Dysphagia, oropharyngeal phase (R13.12) Attention and concentration deficit following -- Frontal lobe and executive function deficit following -- Impact on safety and function Mild aspiration risk   CHL IP TREATMENT RECOMMENDATION 08/09/2020 Treatment Recommendations Therapy as outlined in treatment plan below   Prognosis 08/09/2020 Prognosis for Safe Diet Advancement Good Barriers to Reach Goals Cognitive deficits;Severity of deficits Barriers/Prognosis Comment -- CHL IP DIET RECOMMENDATION 08/09/2020 SLP Diet Recommendations Dysphagia 3 (Mech soft) solids;Nectar thick liquid Liquid Administration via Cup;Straw Medication Administration Crushed with puree Compensations Slow rate;Small sips/bites Postural Changes Remain semi-upright after after feeds/meals (Comment);Seated upright at 90 degrees   CHL IP OTHER RECOMMENDATIONS 08/09/2020 Recommended Consults -- Oral Care Recommendations Oral care BID Other Recommendations --   CHL IP FOLLOW UP RECOMMENDATIONS 08/09/2020 Follow up Recommendations Other (comment)    CHL IP FREQUENCY AND DURATION 08/09/2020 Speech Therapy Frequency (ACUTE ONLY) min 2x/week Treatment Duration 2 weeks      CHL IP ORAL PHASE 08/09/2020 Oral Phase WFL Oral - Pudding Teaspoon -- Oral - Pudding Cup -- Oral - Honey Teaspoon -- Oral - Honey Cup -- Oral - Nectar Teaspoon -- Oral - Nectar Cup -- Oral - Nectar Straw -- Oral - Thin Teaspoon -- Oral - Thin Cup -- Oral - Thin Straw -- Oral - Puree -- Oral - Mech Soft -- Oral - Regular -- Oral - Multi-Consistency -- Oral - Pill -- Oral Phase - Comment --  CHL IP PHARYNGEAL PHASE 08/09/2020 Pharyngeal Phase Impaired Pharyngeal- Pudding Teaspoon -- Pharyngeal -- Pharyngeal- Pudding Cup -- Pharyngeal -- Pharyngeal- Honey Teaspoon -- Pharyngeal -- Pharyngeal- Honey Cup -- Pharyngeal -- Pharyngeal- Nectar Teaspoon -- Pharyngeal -- Pharyngeal- Nectar Cup -- Pharyngeal -- Pharyngeal- Nectar Straw -- Pharyngeal -- Pharyngeal- Thin Teaspoon -- Pharyngeal -- Pharyngeal- Thin Cup Reduced airway/laryngeal closure;Penetration/Aspiration during swallow;Penetration/Apiration after swallow Pharyngeal Material enters airway, CONTACTS cords and not ejected out;Material enters airway, passes BELOW cords without attempt by patient to eject out (silent aspiration);Material enters airway, passes BELOW cords and not ejected out despite cough attempt by patient Pharyngeal- Thin Straw Reduced airway/laryngeal closure;Penetration/Apiration after swallow;Penetration/Aspiration during swallow Pharyngeal Material enters airway, CONTACTS cords and not ejected out;Material enters airway, passes BELOW cords without attempt by patient to eject out (silent aspiration);Material enters airway, passes BELOW cords and not ejected out despite cough attempt by patient Pharyngeal- Puree -- Pharyngeal -- Pharyngeal- Mechanical Soft Delayed swallow initiation-pyriform sinuses Pharyngeal -- Pharyngeal- Regular -- Pharyngeal -- Pharyngeal- Multi-consistency -- Pharyngeal -- Pharyngeal- Pill -- Pharyngeal --  Pharyngeal Comment --  CHL IP CERVICAL ESOPHAGEAL PHASE 08/09/2020 Cervical Esophageal Phase (No Data) Pudding Teaspoon -- Pudding Cup -- Honey Teaspoon -- Honey Cup -- Nectar Teaspoon -- Nectar Cup -- Nectar Straw -- Thin Teaspoon -- Thin Cup -- Thin Straw -- Puree -- Mechanical Soft -- Regular -- Multi-consistency -- Pill -- Cervical Esophageal Comment -- Shanika I. Hardin Negus, [place], [place] Office number [phone] Pager [place] 08/09/2020, 2:08 PM              Scheduled Meds: . allopurinol  300 mg Oral Daily  . amLODipine  2.5 mg Oral Daily  . atorvastatin  20 mg Oral Daily  . cholecalciferol  2,000 Units Oral Daily  . donepezil  5 mg Oral QHS  . ferrous sulfate  325 mg Oral Q M,W,F  .  heparin injection (subcutaneous)  5,000 Units Subcutaneous Q8H  . magnesium oxide  400 mg Oral BID  . megestrol  400 mg Oral Daily  . mometasone-formoterol  2 puff Inhalation BID  . multivitamin with minerals  1 tablet Oral Daily  . nicotine  21 mg Transdermal Daily  . pantoprazole sodium  40 mg Oral BID AC  . potassium chloride SA  20 mEq Oral BID  . QUEtiapine  25 mg Oral BID  . senna-docusate  1 tablet Oral QHS  . sodium chloride flush  3 mL Intravenous Q12H  . sucralfate  1 g Oral QID  . cyanocobalamin  1,000 mcg Oral Daily   Continuous Infusions: . sodium chloride    . sodium chloride 75 mL/hr at 08/10/20 0916     LOS: 4 days    Kerney Elbe, DO [facility] PAGER is on AMION  If 7PM-7AM, please contact night-coverage [email or web address]

## 2020-08-11 ENCOUNTER — Inpatient Hospital Stay: Payer: Medicare Other

## 2020-08-11 ENCOUNTER — Other Ambulatory Visit: Payer: Self-pay | Admitting: Student

## 2020-08-11 DIAGNOSIS — Z23 Encounter for immunization: Secondary | ICD-10-CM

## 2020-08-11 DIAGNOSIS — R55 Syncope and collapse: Secondary | ICD-10-CM

## 2020-08-11 LAB — CULTURE, BLOOD (ROUTINE X 2)
Culture: NO GROWTH
Culture: NO GROWTH

## 2020-08-11 MED ORDER — HYDRALAZINE HCL 20 MG/ML IJ SOLN: 10.0000 mg | Freq: Four times a day (QID) | INTRAMUSCULAR | Status: DC | PRN

## 2020-08-11 NOTE — Progress Notes (Signed)
   Covid-19 Vaccination Clinic  Name:  John [name]    MRN: [id number] DOB: 12-Jan-1946  08/11/2020  John [name] was observed post Covid-19 immunization for 15 minutes without incident. He was provided with Vaccine Information Sheet and instruction to access the V-Safe system.   John [name] was instructed to call 911 with any severe reactions post vaccine: Marland Kitchen Difficulty breathing  . Swelling of face and throat  . A fast heartbeat  . A bad rash all over body  . Dizziness and weakness   Immunizations Administered    Name Date Dose VIS Date Route   Moderna COVID-19 Vaccine 08/11/2020 10:42 AM 0.5 mL 11/2019 Intramuscular   Manufacturer: Moderna   Lot: [id number]   [place]: [phone]

## 2020-08-11 NOTE — Progress Notes (Signed)
Pt to discharge to SNF, likely over weekend. Pending insurance auth so unable to apply as inpatient with unclear d/c date.   [facility] in [place].

## 2020-08-11 NOTE — TOC Progression Note (Addendum)
Transition of Care [facility]) - Progression Note    Patient Details  Name: John [name] MRN: [id number] Date of Birth: [birth year]  Transition of Care [facility]) CM/SW [place], [place] Phone Number: 08/11/2020, 12:07 PM  Clinical Narrative:    CSW informed by RN that patient has received covid. Insurance authorization is currently still pending.   CSW contacted [facility] and confirmed patient can discharge to the facility over the weekend if insurance is authorized. CSW updated MD.   Expected Discharge Plan: [place] Barriers to Discharge: Continued Medical Work up  Expected Discharge Plan and Services Expected Discharge Plan: [place]       Living arrangements for the past 2 months: Single Family Home                                       Social Determinants of Health (SDOH) Interventions    Readmission Risk Interventions Readmission Risk Prevention Plan 07/09/2019 04/30/2019 04/30/2019  Transportation Screening Complete - Complete  Medication Review Press photographer) Complete - Complete  PCP or Specialist appointment within 3-5 days of discharge - Complete -  [place] or Home Care Consult Complete - Complete  SW Recovery Care/Counseling Consult Complete - -  Palliative Care Screening Not Applicable - Not [place] Not Applicable - Not Applicable  Some recent data might be hidden

## 2020-08-11 NOTE — Progress Notes (Addendum)
  Reviewed tele. No further pauses, no further syncope.   Plan Zio patch and follow up.   Insurance authorization for SNF pending, and cannot guarantee Zio placement over weekend.   I will arrange for Zio to be sent to SNF, and be applied there. Discussed with attending.  If somehow he is still here on Monday, we will apply then, but no need to hold for this.   Order has been placed and discussed with monitoring staff.   EP to see as needed while here.  Please call with questions.   Legrand Como [address] [place], PA-C  08/11/2020 1:59 PM

## 2020-08-11 NOTE — Progress Notes (Signed)
PROGRESS NOTE    NYMIR RINGLER  LPF:[id number] DOB: October 12, 1946 DOA: 08/06/2020 PCP: [name], NP   Brief Narrative:  HPI per Dr. Adella Hare on 08/06/20 John [name] is a 74 y.o. male with medical history significant of HTN, HLD, COPD, CHF, and squamous cell carcinoma of the right lung presents to the ED with syncope while working in the yard today.Per EMS the patient was working in the yard with his son which is not unusual. The son apparently witnessed the patient collapsed to the ground and lose consciousness.He felt mainly to his knees and there was no reported head injury or trauma. Fire arrived on scene and reported that the patient was hypoxic to the 80s.They started nonrebreather and when EMS arrived they were able to titrate to nasal cannula currently at 1 L.No witnessed seizure activity. The patient does not recall the event. He does not recall feeling badly prior to this. He states that overall his energy has been good, he has been eating well, compliant with his medications.No recent medication changes. Patient is currently followed by palliative care but not in the hospice program. Review of recent Oncology note mentions patient is on home O2 but not apparently wearing this today.  As patient was walking out of dept after being discharged had a witnessed syncopal episode with collapse with respiratory distress requiring brief assistant. He was noted to have non-sustained V-Tach. Due to two episodes of syncope he is referred to [facility] for tele admission for possible arrythmogenic syncope. Cardiology is to see patient in the ED.    Patient had previous admission for syncope and collapse 2020 with negative EEG and neuro consult - seizure ruled out.   ED Course: afebrile, 143/68, HR 91, RR 22 O2 sat 95%. Lab results significant for glucose 123, Cr 1.38-at his baseline, BNP 209.9, Troponin #1 6, #2 9. Hgb 8.4 _0  hras, 10.9 @ 1716 hrs. CTA chest negative for  PE, RLL mass enlarged. Due to outpatient syncope and witnessed syncope in the ED he is referred to [facility] for tele admission to monitor for arrythmia.  Review of Systems: As per HPI otherwise 10 point review of systems negative. Caveat - patient has very poor short term memory.  **Interim History His beta-blockers were held and he has had no further bradycardia.  The EP team recommends to see as needed and they can arrange a zio on discharge.  Medical cardiology recommends continue to hold beta-blocker and will follow up as an outpatient as well.  PT OT recommending SNF.  Patient was having some dysphagia yesterday and SLP recommending dysphagia 3 diet with nectar thick liquids.  He was extremely agitated and confused today but he does have a history of dementia and family states that he does have some behavioral disturbances but not this bad. Patient had a Air cabin crew but we have discontinued his now. PT OT recommending skilled nursing facility and patient's family is agreeable for SNF placement so safety sitter has been discontinued and will empirically obtain a repeat Covid test.   Currently he is medically stable to be discharged to SNF however insurance authorization is still pending.  He has been over at least 24 hours no Air cabin crew.  Covid test has been repeated and is negative.  He is stable to be discharged and EP cardiology was planning to apply the [place] patch prior to discharge however could not be done due to the lack of discharge date and they do not find him over the  weekend.  Zio patch will be sent to the facility and can be applied there.  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  - Neurologic etiology has been ruled out: negative EEG, neuro consult opinion.  - CT brain without acute changes or mets. Suspect cardiogenic etiology. - Cards/EP: seeing sinsus brady and excessive vagal tone; holding BB  - no further syncopal episodes; patient to continue holding BB, place zio patch at discharge; appreciate cards/EP assistance -PT/OT and recommending SNF and family agreeable and he is stable to D/C but needs Insurance Authorization  Cough - ?dyspahgia; coughing w/ drinking/eating per nursing - weak cough and fair amount of upper airway transmission today - NPO, except meds - SLP consult done and showed an MBS with dysphagia 3 diet with nectar thick liquids -Was on O2 this AM -Currently in no Respiratory Distress  Chronic HFpEF  - mild elevation of BNP. Does not appear decompensated.  - Last Echo Dec '20 with EF 60-65% and impaired relaxtion. - Echo 8/9: Left ventricular ejection fraction, by estimation, is 60 to 65%. The left ventricle has normal function. The left ventricle has no regional wall motion abnormalities. Left ventricular diastolic parameters are consistent with Grade I diastolic dysfunction (impaired relaxation). -Patient is +2.000 Liters since admission and getting gentle IVF hydration  Controlled DMt2  - on no medication.  - Serum glucose with mild elevation. CBGs ranging from 92-125 - A1c: 4.9  Progressive NSCCA RLL with increased mass size  - seen on CTA.  - Pt losing wt. - Follow up with Dr. Earlie Server outpt.   COPD  Chronic Respiratory Failure with Hypoxia  - By records was on home O2 at one time and is on O2 now in the setting of COPD and Lung Cancer - CXR suggests possible infiltrate but CTA does not reveal any infiltrate.  - He was given Ceftriaxone and Azithromycin in ED but is afebrile and no  leukcytosis, no cough or sputum production, no hypoxemia. - holding further abx -SpO2: 100 % O2 Flow Rate (L/min): 2 L/min - C/wdulera, PRN albuterol - O2 support; wean as able  Dementia with Behavioral Disturbances -C/w aricept, seroquel -[place] Precautions -Haldol for Agitation -1:1 Sitter now removed now to facilitate D/C to SNF  -May need to have MRI to rule out metastasis to the brain given his significant agitation but he is improved today  -Will consider a Palliative Consult in the outpatient setting   HLD -Atorvastatin  HTN -C/w Amlodipine 2.5 mg p.o. daily  AKI -baseline SCr 1.0 - 1.1 -Scr at admission 1.4; had originally resolved but will resume IVF given worsened Renal Fxn; Cr was 1.44 this AM and repeat yesterday AM is 1.28 -Started back on IV fluid hydration with normal saline at 75 mL's per hour but will stop later.  -Avoid nephrotoxic medications, contrast dyes, hypotension and renally adjust medications -Will not repeat CMP as patient is stable to D/C  Normocytic Anemia -Patient's Hgb/Hct went from 8.5/28.2 -> 8.3/27.4 -> 8.1/26.1 -Check Anemia Panel and showed iron  level of 86, U IBC of 205, TIBC of 291, saturation ratio 30%, ferritin level 40, folate level 30.3, and vitamin B12 level 748. -Continue to Monitor for S/Sx of Bleeding -Currently no overt Bleeding noted  DVT prophylaxis: Enoxaparin 40 mg subcu every 24h Code Status: FULL CODE  Family Communication: Discussed with Daughter over the Telephone Disposition Plan: SNF when Insurance Authorization is obtained.   Status is: Inpatient  Remains inpatient appropriate because:Altered mental status and Inpatient level of care appropriate due to severity of illness   Dispo: The patient is from: Home              Anticipated d/c is to: SNF              Anticipated d/c date is: 1 day              Patient currently is medically stable to d/c.  Consultants:   EP Cardiology  Cardiology     Procedures: None  Antimicrobials:  Anti-infectives (From admission, onward)   Start     Dose/Rate Route Frequency Ordered Stop   08/06/20 1400  cefTRIAXone (ROCEPHIN) 1 g in sodium chloride 0.9 % 100 mL IVPB        1 g 200 mL/hr over 30 Minutes Intravenous  Once 08/06/20 1358 08/06/20 1651   08/06/20 1400  azithromycin (ZITHROMAX) 500 mg in sodium chloride 0.9 % 250 mL IVPB        500 mg 250 mL/hr over 60 Minutes Intravenous  Once 08/06/20 1358 08/06/20 1651   08/06/20 0000  amoxicillin (AMOXIL) 500 MG capsule     Discontinue     500 mg Oral 3 times daily 08/06/20 1649     08/06/20 0000  azithromycin (ZITHROMAX) 250 MG tablet     Discontinue     250 mg Oral Daily 08/06/20 1649       Subjective: Seen and examined at bedside and he was resting today and in no acute distress.  Wanting to sleep.  No nausea or vomiting.  Safety sitter has been removed for 24 hours and is stable to be discharged to SNF but will need insurance authorization.  He denies any other concerns or complaints at this time.  Objective: Vitals:   08/10/20 1737 08/10/20 1945 08/11/20 0435 08/11/20 1125  BP:  (!) 182/73 (!) 192/71 131/60  Pulse:  87 78 74  Resp:  _0 Temp:  98.1 F (36.7 C) 97.9 F (36.6 C) 97.8 F (36.6 C)  TempSrc:  Oral Oral Oral  SpO2: 95% 99% 93% 100%  Weight:   66.3 kg   Height:        Intake/Output Summary (Last 24 hours) at 08/11/2020 1540 Last data filed at 08/11/2020 1309 Gross per 24 hour  Intake 540 ml  Output 600 ml  Net -60 ml   Filed Weights   08/09/20 0535 08/10/20 0412 08/11/20 0435  Weight: 69.3 kg 66.4 kg 66.3 kg   Examination: Physical Exam:  Constitutional: WN/WD slightly overweight African-American male currently no acute distress appears calm and is resting and wanting to sleep. Eyes: Lids and conjunctivae normal, sclerae anicteric  ENMT: External Ears, Nose appear normal. Grossly normal hearing.  Neck: Appears normal, supple, no cervical masses,  normal ROM, no appreciable thyromegaly Respiratory: Diminished to auscultation bilaterally with coarse breath sounds and some rhonchi on the right compared to left.  Has a normal respiratory effort and is not tachypneic but he is wearing supplemental oxygen via nasal cannula  and has unlabored breathing Cardiovascular: RRR, no murmurs / rubs / gallops. S1 and S2 auscultated.  Trace extremity edema.  Abdomen: Soft, non-tender, mildly distended secondary to body habitus. Bowel sounds positive.  GU: Deferred. Musculoskeletal: No clubbing / cyanosis of digits/nails. No joint deformity upper and lower extremities.   Skin: No rashes, lesions, ulcers on limited skin evaluation. No induration; Warm and dry.  Neurologic: CN 2-12 grossly intact with no focal deficits. Romberg sign and cerebellar reflexes not assessed.  Psychiatric: Impaired judgment and insight.  Slightly somnolent and drowsy. Normal mood and appropriate affect.   Data Reviewed: I have personally reviewed following labs and imaging studies  CBC: Recent Labs  Lab 08/06/20 1311 08/06/20 1716 08/08/20 1122 08/09/20 0750 08/10/20 0427  WBC 8.4  --  7.6 8.0 6.3  NEUTROABS 7.2  --  6.2 6.6 4.8  HGB 8.4* 10.9* 8.5* 8.3* 8.1*  HCT 28.0* 32.0* 28.2* 27.4* 26.1*  MCV 99.3  --  100.0 100.0 98.9  PLT 284  --  277 259 956   Basic Metabolic Panel: Recent Labs  Lab 08/06/20 1528 08/06/20 1716 08/07/20 0732 08/08/20 1122 08/09/20 0750 08/10/20 0427 08/10/20 1052  NA  --    < > 141 142 141 141 141  K  --    < > 4.7 4.6 4.5 4.9 4.7  CL  --    < > 99 100 99 101 102  CO2  --   --  33* 33* 33* 33* 31  GLUCOSE  --    < > 79 102* 92 104* 125*  BUN  --    < > _0 CREATININE  --    < > 1.27* 1.15 1.33* 1.42* 1.28*  CALCIUM  --   --  8.7* 8.8* 9.0 8.9 8.9  MG 1.6*  --  1.9  --  1.7 1.9  --   PHOS  --   --   --  3.4 2.8 3.7  --    < > = values in this interval not displayed.   GFR: Estimated Creatinine Clearance: 43.4 mL/min  (A) (by C-G formula based on SCr of 1.28 mg/dL (H)). Liver Function Tests: Recent Labs  Lab 08/06/20 1311 08/08/20 1122 08/09/20 0750 08/10/20 0427  AST 50*  --  11* 13*  ALT 18  --  13 11  ALKPHOS 80  --  63 58  BILITOT 0.7  --  0.8 0.6  PROT 5.7*  --  6.2* 6.0*  ALBUMIN 2.5* 2.6* 2.8* 2.7*   No results for input(s): LIPASE, AMYLASE in the last 168 hours. No results for input(s): AMMONIA in the last 168 hours. Coagulation Profile: No results for input(s): INR, PROTIME in the last 168 hours. Cardiac Enzymes: No results for input(s): CKTOTAL, CKMB, CKMBINDEX, TROPONINI in the last 168 hours. BNP (last 3 results) No results for input(s): PROBNP in the last 8760 hours. HbA1C: No results for input(s): HGBA1C in the last 72 hours. CBG: Recent Labs  Lab 08/06/20 1709  GLUCAP 97   Lipid Profile: No results for input(s): CHOL, HDL, LDLCALC, TRIG, CHOLHDL, LDLDIRECT in the last 72 hours. Thyroid Function Tests: Recent Labs    08/09/20 1109  TSH 1.761   Anemia Panel: Recent Labs    08/09/20 1109 08/10/20 1052  VITAMINB12 842 748  FOLATE  --  30.3  FERRITIN  --  40  TIBC  --  291  IRON  --  86  RETICCTPCT  --  2.0  Sepsis Labs: Recent Labs  Lab 08/06/20 2138  PROCALCITON <0.10    Recent Results (from the past 240 hour(s))  SARS Coronavirus 2 by RT PCR (hospital order, performed in [facility] hospital lab) Nasopharyngeal Nasopharyngeal Swab     Status: None   Collection Time: 08/06/20  1:28 PM   Specimen: Nasopharyngeal Swab  Result Value Ref Range Status   SARS Coronavirus 2 NEGATIVE NEGATIVE Final    Comment: (NOTE) SARS-CoV-2 target nucleic acids are NOT DETECTED.  The SARS-CoV-2 RNA is generally detectable in upper and lower respiratory specimens during the acute phase of infection. The lowest concentration of SARS-CoV-2 viral copies this assay can detect is 250 copies / mL. A negative result does not preclude SARS-CoV-2 infection and should not be  used as the sole basis for treatment or other patient management decisions.  A negative result may occur with improper specimen collection / handling, submission of specimen other than nasopharyngeal swab, presence of viral mutation(s) within the areas targeted by this assay, and inadequate number of viral copies (<250 copies / mL). A negative result must be combined with clinical observations, patient history, and epidemiological information.  Fact Sheet for Patients:   StrictlyIdeas.no  Fact Sheet for Healthcare Providers: BankingDealers.co.za  This test is not yet approved or  cleared by the [place] FDA and has been authorized for detection and/or diagnosis of SARS-CoV-2 by FDA under an Emergency Use Authorization (EUA).  This EUA will remain in effect (meaning this test can be used) for the duration of the COVID-19 declaration under Section 564(b)(1) of the Act, 21 U.S.C. section 360bbb-3(b)(1), unless the authorization is terminated or revoked sooner.  Performed at [facility], [place] [address], [place], [place] [address]   Urine culture     Status: None   Collection Time: 08/06/20  2:45 PM   Specimen: Urine, Clean Catch  Result Value Ref Range Status   Specimen Description URINE, CLEAN CATCH  Final   Special Requests NONE  Final   Culture   Final    NO GROWTH Performed at [facility], [place] [address] [place], [place] [address]    Report Status 08/07/2020 FINAL  Final  Culture, blood (routine x 2)     Status: None   Collection Time: 08/06/20  3:28 PM   Specimen: BLOOD RIGHT FOREARM  Result Value Ref Range Status   Specimen Description BLOOD RIGHT FOREARM  Final   Special Requests   Final    BOTTLES DRAWN AEROBIC AND ANAEROBIC Blood Culture results may not be optimal due to an inadequate volume of blood received in culture bottles   Culture   Final    NO GROWTH 5 DAYS Performed at [facility],  [place] [address], [place], [place] [address]    Report Status 08/11/2020 FINAL  Final  Culture, blood (routine x 2)     Status: None   Collection Time: 08/06/20  3:37 PM   Specimen: BLOOD LEFT FOREARM  Result Value Ref Range Status   Specimen Description BLOOD LEFT FOREARM  Final   Special Requests   Final    BOTTLES DRAWN AEROBIC AND ANAEROBIC Blood Culture results may not be optimal due to an inadequate volume of blood received in culture bottles   Culture   Final    NO GROWTH 5 DAYS Performed at [facility], [place] [address] [place], [place] [address]    Report Status 08/11/2020 FINAL  Final  SARS Coronavirus 2  by RT PCR (hospital order, performed in [facility] hospital lab) Nasopharyngeal Nasopharyngeal Swab     Status: None   Collection Time: 08/10/20  2:34 PM   Specimen: Nasopharyngeal Swab  Result Value Ref Range Status   SARS Coronavirus 2 NEGATIVE NEGATIVE Final    Comment: (NOTE) SARS-CoV-2 target nucleic acids are NOT DETECTED.  The SARS-CoV-2 RNA is generally detectable in upper and lower respiratory specimens during the acute phase of infection. The lowest concentration of SARS-CoV-2 viral copies this assay can detect is 250 copies / mL. A negative result does not preclude SARS-CoV-2 infection and should not be used as the sole basis for treatment or other patient management decisions.  A negative result may occur with improper specimen collection / handling, submission of specimen other than nasopharyngeal swab, presence of viral mutation(s) within the areas targeted by this assay, and inadequate number of viral copies (<250 copies / mL). A negative result must be combined with clinical observations, patient history, and epidemiological information.  Fact Sheet for Patients:   StrictlyIdeas.no  Fact Sheet for Healthcare Providers: BankingDealers.co.za  This test is not yet approved or  cleared by the [place]  FDA and has been authorized for detection and/or diagnosis of SARS-CoV-2 by FDA under an Emergency Use Authorization (EUA).  This EUA will remain in effect (meaning this test can be used) for the duration of the COVID-19 declaration under Section 564(b)(1) of the Act, 21 U.S.C. section 360bbb-3(b)(1), unless the authorization is terminated or revoked sooner.  Performed at [facility], [place] [address] [place], [place] [address]      RN Pressure Injury Documentation:     Estimated body mass index is 24.33 kg/m as calculated from the following:   Height as of this encounter: _0  (1.651 m).   Weight as of this encounter: 66.3 kg.  Malnutrition Type:      Malnutrition Characteristics:      Nutrition Interventions:    Radiology Studies: No results found. Scheduled Meds: . allopurinol  300 mg Oral Daily  . amLODipine  2.5 mg Oral Daily  . atorvastatin  20 mg Oral Daily  . cholecalciferol  2,000 Units Oral Daily  . donepezil  5 mg Oral QHS  . ferrous sulfate  325 mg Oral Q M,W,F  . guaiFENesin  1,200 mg Oral BID  . heparin injection (subcutaneous)  5,000 Units Subcutaneous Q8H  . magnesium oxide  400 mg Oral BID  . megestrol  400 mg Oral Daily  . mometasone-formoterol  2 puff Inhalation BID  . multivitamin with minerals  1 tablet Oral Daily  . nicotine  21 mg Transdermal Daily  . pantoprazole sodium  40 mg Oral BID AC  . potassium chloride SA  20 mEq Oral BID  . QUEtiapine  25 mg Oral BID  . senna-docusate  1 tablet Oral QHS  . sodium chloride flush  3 mL Intravenous Q12H  . sucralfate  1 g Oral QID  . cyanocobalamin  1,000 mcg Oral Daily   Continuous Infusions: . sodium chloride       LOS: 5 days    Kerney Elbe, DO [facility] PAGER is on AMION  If 7PM-7AM, please contact night-coverage [email or web address]

## 2020-08-11 NOTE — Progress Notes (Signed)
MD notified that patient pulled IV. Per MD care instruction (see orders) patient may remain without IV access.

## 2020-08-12 DIAGNOSIS — I10 Essential (primary) hypertension: Secondary | ICD-10-CM

## 2020-08-12 MED ORDER — AMLODIPINE BESYLATE 10 MG PO TABS: 10.0000 mg | ORAL_TABLET | Freq: Every day | ORAL | Status: DC

## 2020-08-12 MED ORDER — RESOURCE THICKENUP CLEAR PO POWD: 1.0000 | ORAL | Status: AC | PRN

## 2020-08-12 MED ORDER — BENZONATATE 200 MG PO CAPS: 200.0000 mg | ORAL_CAPSULE | Freq: Two times a day (BID) | ORAL | 0 refills | Status: AC | PRN

## 2020-08-12 MED ORDER — NICOTINE 21 MG/24HR TD PT24: 21.0000 mg | MEDICATED_PATCH | Freq: Every day | TRANSDERMAL | 0 refills | Status: AC

## 2020-08-12 MED ORDER — AMLODIPINE BESYLATE 5 MG PO TABS
7.5000 mg | ORAL_TABLET | Freq: Once | ORAL | Status: AC
  Administered 2020-08-12: 7.5 mg via ORAL
  Filled 2020-08-12: qty 1

## 2020-08-12 MED ORDER — GUAIFENESIN ER 600 MG PO TB12: 600.0000 mg | ORAL_TABLET | Freq: Two times a day (BID) | ORAL | 0 refills | Status: AC

## 2020-08-12 NOTE — TOC Progression Note (Signed)
Transition of Care [facility]) - Progression Note    Patient Details  Name: IZEK CORVINO MRN: [id number] Date of Birth: 12/08/1946  Transition of Care [facility]) CM/SW [place], [place] Phone Number: [phone] 08/12/2020, 11:29 AM  Clinical Narrative:     CSW followed up with [facility] to check on authorization and authorization is back.  [employer] #- [id number] [employer] #- [id number] Authorization dates: 8/13-8/17  CSW alerted MD to ascertain if patient was medically stable for discharge today.  TOC team will continue to assist with discharge planning needs.  Expected Discharge Plan: Skilled Nursing Facility Barriers to Discharge: Continued Medical Work up  Expected Discharge Plan and Services Expected Discharge Plan: [place]       Living arrangements for the past 2 months: Single Family Home                                       Social Determinants of Health (SDOH) Interventions    Readmission Risk Interventions Readmission Risk Prevention Plan 07/09/2019 04/30/2019 04/30/2019  Transportation Screening Complete - Complete  Medication Review Press photographer) Complete - Complete  PCP or Specialist appointment within 3-5 days of discharge - Complete -  [place] or Home Care Consult Complete - Complete  SW Recovery Care/Counseling Consult Complete - -  Palliative Care Screening Not Applicable - Not [place] Not Applicable - Not Applicable  Some recent data might be hidden

## 2020-08-12 NOTE — Progress Notes (Signed)
Gave report to facility nurse. All questions answered. Pt stable at this time. Pt is resting in bed with eyes closed. No needs or concerns at this time.

## 2020-08-12 NOTE — Progress Notes (Addendum)
Patient became agitated suddenly requesting a cigarette. Patient then exited the floor with staff present in safety escort. Security was notified; security successfully intercepted the patient on campus grounds. Patient was brought back to the ED for assessment. Vital signs taken. PRN haldol given. Patient calmed down and agreed to return to the unit. Patient is resting in bed with bed alarm on. Patient remains calm and compliant at this time.

## 2020-08-12 NOTE — TOC Transition Note (Signed)
Transition of Care [facility]) - CM/SW Discharge Note   Patient Details  Name: John [name] MRN: [id number] Date of Birth: [birth year]  Transition of Care [facility]) CM/SW Contact:  Bary Castilla, LCSW Phone Number: [phone] 08/12/2020, 2:00 PM   Clinical Narrative:    Patient will DC to:?[facility] [place] Anticipated DC date:08/12/20? Family notified:?Jynette- left VM Transport by: Corey Harold   Per MD patient ready for DC to [facility]. RN, patient, patient's family, and facility notified of DC. Discharge Summary sent to facility. RN given number for report  [phone] room 131A. DC packet on chart. Ambulance transport requested for patient.   CSW signing off.   [place], [place] [phone]    Final next level of care: Skilled Nursing Facility Barriers to Discharge: No Barriers Identified   Patient Goals and CMS Choice Patient states their goals for this hospitalization and ongoing recovery are:: SNF   Choice offered to / list presented to : Adult Children [facility])  Discharge Placement              Patient chooses bed at:  ([facility]) Patient to be transferred to facility by: [place] Name of family member notified: Jynette Patient and family notified of of transfer: 08/12/20  Discharge Plan and Services                                     Social Determinants of Health (SDOH) Interventions     Readmission Risk Interventions Readmission Risk Prevention Plan 07/09/2019 04/30/2019 04/30/2019  Transportation Screening Complete - Complete  Medication Review Press photographer) Complete - Complete  PCP or Specialist appointment within 3-5 days of discharge - Complete -  [place] or Home Care Consult Complete - Complete  SW Recovery Care/Counseling Consult Complete - -  Palliative Care Screening Not Applicable - Not [place] Not Applicable - Not Applicable  Some recent data might be hidden

## 2020-08-12 NOTE — Discharge Summary (Signed)
Physician Discharge Summary  John [name] GGE:[id number] DOB: 06/03/1946 DOA: 08/06/2020  PCP: [name], NP  Admit date: 08/06/2020 Discharge date: 08/12/2020  Admitted From: Home Disposition: SNF  Recommendations for Outpatient Follow-up:  1. Follow up with PCP in 1-2 weeks 2. Follow up with Cardiology (EP and Regular) at D/C; EP to provide Zio Patch to the the SNF 3. Follow up with Medical Oncology as an outpatient for Evaluation of Progressive worsening of Lung Cancer 4. Follow up with Neurology for Dementia with Behavioral Disturbances 5. Consider outpatient Palliative Care Evaluation  6. Please obtain CMP/CBC, Mag, Phos in one week 7. Repeat CXR in 3-6 weeks 8. Please follow up on the following pending results:  Home Health: No Equipment/Devices: None  Discharge Condition: Stable CODE STATUS: DO NOT RESUSCITATE Diet recommendation: Heart Healthy Dysphagia 3 Diet with Nectar Thick Liquids  Brief/Interim Summary: HPI per Dr. Adella Hare on 08/06/20 John [name] a 74 y.o.malewith medical history significant ofHTN, HLD, COPD, CHF, and squamous cell carcinoma of the right lung presents to the ED with syncope while working in the yard today.Per EMS the patient was working in the yard with his son which is not unusual. The son apparently witnessed the patient collapsed to the ground and lose consciousness.He felt mainly to his knees and there was no reported head injury or trauma. Fire arrived on scene and reported that the patient was hypoxic to the 80s.They started nonrebreather and when EMS arrived they were able to titrate to nasal cannula currently at 1 L.No witnessed seizure activity. The patient does not recall the event. He does not recall feeling badly prior to this. He states that overall his energy has been good, he has been eating well, compliant with his medications.No recent medication changes. Patient is currently followed by palliative  care but not in the hospice program. Review of recent Oncology note mentions patient is on home O2 but not apparently wearing this today.  As patient was walking out of dept after being discharged had a witnessed syncopal episode with collapse with respiratory distress requiring brief assistant. He was noted to have non-sustained V-Tach. Due to two episodes of syncope he is referred to [facility] for tele admission for possible arrythmogenic syncope. Cardiology is to see patient in the ED.  Patient had previous admission for syncope and collapse 2020 with negative EEG and neuro consult - seizure ruled out.  ED Course:afebrile, 143/68, HR 91, RR 22 O2 sat 95%.Lab results significant for glucose 123, Cr 1.38-at his baseline, BNP 209.9, Troponin #1 6, #2 9. Hgb 8.4 _0  hras, 10.9 @ 1716 hrs. CTA chest negative for PE, RLL mass enlarged. Due to outpatient syncope and witnessed syncope in the ED he is referred to [facility] for tele admission to monitor for arrythmia.  Review of Systems: As per HPI otherwise 10 point review of systems negative.Caveat - patient has very poor short term memory.  **Interim History His beta-blockers were held and he has had no further bradycardia.  The EP team recommends to see as needed and they can arrange a zio on discharge.  Medical cardiology recommends continue to hold beta-blocker and will follow up as an outpatient as well.  PT OT recommending SNF.  Patient was having some dysphagia yesterday and SLP recommending dysphagia 3 diet with nectar thick liquids.  He was extremely agitated and confused today but he does have a history of dementia and family states that he does have some behavioral disturbances but not this bad. Patient  had a Air cabin crew but we have discontinued his now. PT OT recommending skilled nursing facility and patient's family is agreeable for SNF placement so safety sitter has been discontinued and will empirically obtain a repeat Covid test.    Currently he is medically stable to be discharged to SNF however insurance authorization is still pending.  He has been over at least 24 hours no Air cabin crew.  Covid test has been repeated and is negative.  He is stable to be discharged and EP cardiology was planning to apply the [place] patch prior to discharge however could not be done due to the lack of discharge date and they do not find him over the weekend.  Zio patch will be sent to the facility and can be applied there.  Discharge Diagnoses:  Active Problems:   Chronic diastolic CHF (congestive heart failure) (HCC)   Diabetes mellitus (HCC)   HTN (hypertension)   COPD II/III with reversibility    Primary cancer of right lower lobe of lung (HCC)   CKD (chronic kidney disease), stage III   Syncope and collapse   Vascular dementia without behavioral disturbance (HCC)   Sinus node dysfunction (HCC)  Syncope and Collapse - has had multiple episodes and at least one admission for this problem.  - Neurologic etiology has been ruled out: negative EEG, neuro consult opinion.  - CT brain without acute changes or mets. Suspect cardiogenic etiology. - Cards/EP: seeing sinsus brady and excessive vagal tone; holding BB - no further syncopal episodes; patient to continue holding BB, place zio patch at discharge; appreciate cards/EP assistance -PT/OT and recommending SNF and family agreeable and he is stable to D/C but needs Ship broker -EP to [employer] at D/C to SNF  Cough - ?dyspahgia; coughing w/ drinking/eating per nursing - weak cough and fair amount of upper airway transmission today - NPO, except meds - SLP consult done and showed an MBS with dysphagia 3 diet with nectar thick liquids and will d/C with that  -Was on O2 this AM and has been wearing it at home intermittently  -Currently in no Respiratory Distress  Chronic HFpEF  - mild elevation of BNP. Does not appear decompensated.  - Last Echo Dec '20  with EF 60-65% and impaired relaxtion. -Echo 8/9:Left ventricular ejection fraction, by estimation, is 60 to 65%. The left ventricle has normal function. The left ventricle has no regional wall motion abnormalities. Left ventricular diastolic parameters are consistent with Grade I diastolic dysfunction (impaired relaxation). -Patient is +1.92 Liters since admission and getting gentle IVF hydration  Controlled DMt2  - on Metformin at Home  - Serum glucose with mild elevation. CBGs ranging from 92-125 - A1c: 4.9 -Stop Metformin at D/c   Progressive NSCCA RLL with increased mass size  - seen on CTA.  - Pt losing wt. - Follow up with Dr. Earlie Server outpt.  - Will need to see Oncology for further evaluation   COPD  Chronic Respiratory Failure with Hypoxia  - By records was on home O2 at one time and is on O2 now in the setting of COPD and Lung Cancer - CXR suggests possible infiltrate but CTA does not reveal any infiltrate. Abx were stopped  - He was given Ceftriaxone and Azithromycin in ED but is afebrile and no leukcytosis, no cough or sputum production, no hypoxemia. - holding further abx and will discontinue the Amoxicillin and Azithromycin started by the ED for D/C -SpO2: 99 % O2 Flow Rate (L/min): 2 L/min;  Follow up with Medical Oncology as an outpatient  - C/wdulera, PRN albuterol - O2 support; wean as able  Dementia with Behavioral Disturbances -C/w aricept, seroquel at D/C  -Delrium Precautions -Haldol for Agitation -1:1 Sitter now removed now to facilitate D/C to SNF and he did not require it last night -Pulled out his IV yesterday and Left the floor yesterday with a safety escort after being significantly requesting a cigarette aand was brought back by Security when he was intercepted on campus grounds. Given Haldol and calmed down and is much improved today  -May need to have MRI to rule out metastasis to the brain given his significant agitation but he is improved today   -Will consider a Palliative Consult in the outpatient setting   HLD -Atorvastatin  HTN -C/w Amlodipine 2.5 mg p.o. daily and increased to 10 mg po Daily as Home dose  -BP was a little elevated today at 173/105  AKI -baseline SCr 1.0 - 1.1 -Scr at admission 1.4;had originally resolved but will resume IVF given worsened Renal Fxn; Cr was 1.44 this AM and repeat day before yesterday AM is 1.28 -Started back on IV fluid hydration with normal saline at 75 mL's per hour but will stop later.  -Avoid nephrotoxic medications, contrast dyes, hypotension and renally adjust medications -Did not repeat CMP as patient is stable to D/C -Repeat CMP at SNF   Normocytic Anemia -Patient's Hgb/Hct went from 8.5/28.2 -> 8.3/27.4 -> 8.1/26.1 -Check Anemia Panel and showed iron level of 86, U IBC of 205, TIBC of 291, saturation ratio 30%, ferritin level 40, folate level 30.3, and vitamin B12 level 748. -C/w Ferrous Sulfate 325 mg MWF -Continue to Monitor for S/Sx of Bleeding -Currently no overt Bleeding noted -Repeat CBC within 1 week   Discharge Instructions  Discharge Instructions    Call MD for:  difficulty breathing, headache or visual disturbances   Complete by: As directed    Call MD for:  extreme fatigue   Complete by: As directed    Call MD for:  hives   Complete by: As directed    Call MD for:  persistant dizziness or light-headedness   Complete by: As directed    Call MD for:  persistant nausea and vomiting   Complete by: As directed    Call MD for:  redness, tenderness, or signs of infection (pain, swelling, redness, odor or green/yellow discharge around incision site)   Complete by: As directed    Call MD for:  severe uncontrolled pain   Complete by: As directed    Call MD for:  temperature >100.4   Complete by: As directed    Diet - low sodium heart healthy   Complete by: As directed    Dysphagia 3 Diet with Nectar Thick Liquids   Discharge instructions   Complete by: As  directed    You were cared for by a hospitalist during your hospital stay. If you have any questions about your discharge medications or the care you received while you were in the hospital after you are discharged, you can call the unit and ask to speak with the hospitalist on call if the hospitalist that took care of you is not available. Once you are discharged, your primary care physician will handle any further medical issues. Please note that NO REFILLS for any discharge medications will be authorized once you are discharged, as it is imperative that you return to your primary care physician (or establish a relationship with a primary  care physician if you do not have one) for your aftercare needs so that they can reassess your need for medications and monitor your lab values.  Follow up with PCP, Cardiology (EP and Regular), Medical Oncology and Neurology in the outpatient setting. Consider Palliative Care Consult in the outpatient setting. Take all medications as prescribed. If symptoms change or worsen please return to the ED for evaluation   Increase activity slowly   Complete by: As directed      Allergies as of 08/12/2020      Reactions   Levaquin [levofloxacin In D5w] Other (See Comments)   Unknown reaction   Lisinopril Swelling   Facial swelling      Medication List    STOP taking these medications   bisoprolol 5 MG tablet Commonly known as: ZEBETA   cloNIDine 0.1 MG tablet Commonly known as: CATAPRES   megestrol 400 MG/10ML suspension Commonly known as: MEGACE   metFORMIN 1000 MG tablet Commonly known as: GLUCOPHAGE   potassium chloride SA 20 MEQ tablet Commonly known as: KLOR-CON   traMADol 50 MG tablet Commonly known as: ULTRAM   Vitamin D (Ergocalciferol) 1.25 MG (50000 UNIT) Caps capsule Commonly known as: DRISDOL     TAKE these medications   acetaminophen 325 MG tablet Commonly known as: TYLENOL Take 2 tablets (650 mg total) by mouth every 6 (six) hours  as needed for mild pain (or Fever >/= 101).   albuterol 108 (90 Base) MCG/ACT inhaler Commonly known as: ProAir HFA INHALE 2 PUFFS BY MOUTH EVERY 4 HOURS AS NEEDED FOR WHEEZING   allopurinol 300 MG tablet Commonly known as: ZYLOPRIM Take 300 mg by mouth daily.   amLODipine 10 MG tablet Commonly known as: NORVASC Take 10 mg by mouth daily. What changed: Another medication with the same name was removed. Continue taking this medication, and follow the directions you see here.   atorvastatin 20 MG tablet Commonly known as: LIPITOR Take 1 tablet (20 mg total) by mouth daily.   benzonatate 200 MG capsule Commonly known as: TESSALON Take 1 capsule (200 mg total) by mouth 2 (two) times daily as needed for cough.   budesonide-formoterol 160-4.5 MCG/ACT inhaler Commonly known as: SYMBICORT Inhale 2 puffs into the lungs 2 (two) times daily.   cyanocobalamin 1000 MCG tablet Take 1 tablet (1,000 mcg total) by mouth daily.   donepezil 10 MG tablet Commonly known as: ARICEPT Take 1/2 tablet daily for 2 weeks, then increase to 1 tablet daily What changed:   how much to take  how to take this  when to take this  additional instructions   ferrous sulfate 325 (65 FE) MG tablet Take 1 tablet (325 mg total) by mouth every Monday, Wednesday, and Friday.   folic acid 1 MG tablet Commonly known as: FOLVITE Take 1 mg by mouth daily.   guaiFENesin 600 MG 12 hr tablet Commonly known as: MUCINEX Take 1 tablet (600 mg total) by mouth 2 (two) times daily for 10 days.   Magnesium Oxide 200 MG Tabs Take 1 tablet (200 mg total) by mouth 2 (two) times daily.   multivitamin with minerals Tabs tablet Take 1 tablet by mouth daily. Over the counter   nicotine 21 mg/24hr patch Commonly known as: NICODERM CQ - dosed in mg/24 hours Place 1 patch (21 mg total) onto the skin daily. Start taking on: August 13, 2020   pantoprazole 40 MG tablet Commonly known as: PROTONIX Take 1 tablet (40 mg  total) by mouth 2 (  two) times daily before a meal.   QUEtiapine 25 MG tablet Commonly known as: SEROQUEL Take 1 tablet (25 mg total) by mouth 2 (two) times daily.   Resource ThickenUp Clear Powd Take 120 g by mouth as needed.   senna-docusate 8.6-50 MG tablet Commonly known as: Senokot-S Take 1 tablet by mouth at bedtime. What changed: how much to take   sucralfate 1 g tablet Commonly known as: CARAFATE Take 1 g by mouth 4 (four) times daily.   Vitamin D3 50 MCG (2000 UT) capsule Take 2,000 Units by mouth daily.       Contact information for follow-up providers    Vickie Epley, MD Follow up on 09/07/2020.   Specialty: Cardiology Why: at 1020 am for post hospital follow up Contact information: [place] [address] [phone]            Contact information for after-discharge care    [facility] .   Service: Skilled Nursing Contact information: [place] [address] [phone]                 Allergies  Allergen Reactions  . Levaquin [Levofloxacin In D5w] Other (See Comments)    Unknown reaction  . Lisinopril Swelling    Facial swelling   Consultations:  EP Cardiology  Cardiology   Procedures/Studies: CT Head Wo Contrast  Result Date: 08/06/2020 CLINICAL DATA:  Mental status changes EXAM: CT HEAD WITHOUT CONTRAST TECHNIQUE: Contiguous axial images were obtained from the base of the skull through the vertex without intravenous contrast. COMPARISON:  08/01/2019 FINDINGS: Brain: There is atrophy and chronic small vessel disease changes. No acute intracranial abnormality. Specifically, no hemorrhage, hydrocephalus, mass lesion, acute infarction, or significant intracranial injury. Vascular: No hyperdense vessel or unexpected calcification. Skull: No acute calvarial abnormality. Sinuses/Orbits: No acute findings. Old left medial orbital wall blowout fracture. Other:  None IMPRESSION: Atrophy, chronic microvascular disease. No acute intracranial abnormality. Electronically Signed   By: Rolm Baptise M.D.   On: 08/06/2020 15:09   CT Angio Chest PE W and/or Wo Contrast  Result Date: 08/06/2020 CLINICAL DATA:  Syncope EXAM: CT ANGIOGRAPHY CHEST WITH CONTRAST TECHNIQUE: Multidetector CT imaging of the chest was performed using the standard protocol during bolus administration of intravenous contrast. Multiplanar CT image reconstructions and MIPs were obtained to evaluate the vascular anatomy. CONTRAST:  27m OMNIPAQUE IOHEXOL 350 MG/ML SOLN COMPARISON:  06/20/2020 FINDINGS: Cardiovascular: Heart is normal size. Coronary artery and aortic atherosclerosis. No aneurysm. No filling defects in the pulmonary arteries to suggest pulmonary emboli. Mediastinum/Nodes: No mediastinal, hilar, or axillary adenopathy. Trachea and thyroid unremarkable. Stomach is delete that esophagus is filled with food material/debris. Lungs/Pleura: Masslike airspace opacity in the right lower lobe. Overall, airspace disease has increased since prior study. Small right pleural effusion, also increased since prior study. Left lung clear. Upper Abdomen: No acute findings. Musculoskeletal: Chest wall soft tissues are unremarkable. No acute bony abnormality. Advanced degenerative changes with large flowing anterior osteophytes in the thoracic spine. Review of the MIP images confirms the above findings. IMPRESSION: Increasing masslike airspace opacity in the right lower lobe. Previously, the central mass could be visualized, surrounded by airspace disease. Currently, the central mass canal longer be identified within the central airspace opacity. Increasing size of small right pleural effusion. Food material/debris within the esophagus could be related to dysmotility or reflux. Coronary artery disease. No evidence of pulmonary embolus. Aortic Atherosclerosis (  ICD10-I70.0). Electronically Signed   By: Rolm Baptise  M.D.   On: 08/06/2020 15:25   DG Chest Portable 1 View  Result Date: 08/06/2020 CLINICAL DATA:  Hypoxemia. Syncopal episode in his yard. History of RIGHT lung cancer 2011, LEFT lung cancer in 2016. Disease recurrence in May of 2020. EXAM: PORTABLE CHEST 1 VIEW COMPARISON:  08/09/2019 and CT chest on 06/20/2020 FINDINGS: The heart is enlarged and stable in configuration. Patchy airspace filling opacities in the RIGHT LOWER lobe are more opaque, suggesting infectious changes on pre-existing RIGHT LOWER lobe mass. LEFT lung is clear. No pulmonary edema. IMPRESSION: Increased airspace filling opacities in the RIGHT LOWER lobe, suggesting infectious infiltrate in addition to known RIGHT LOWER lobe mass. Electronically Signed   By: Nolon Nations M.D.   On: 08/06/2020 13:45   DG Swallowing Func-Speech Pathology  Result Date: 08/09/2020 Objective Swallowing Evaluation: Type of Study: MBS-Modified Barium Swallow Study  Patient Details Name: John [name] MRN: [id number] Date of Birth: 02-Sep-1946 Today's Date: 08/09/2020 Time: SLP Start Time (ACUTE ONLY): 8592 -SLP Stop Time (ACUTE ONLY): 1321 SLP Time Calculation (min) (ACUTE ONLY): 14 min Past Medical History: Past Medical History: Diagnosis Date . B12 deficiency  . CHF (congestive heart failure) ([place])  . COPD (chronic obstructive pulmonary disease) ([place])  . Diabetes mellitus without complication ([place])  . Folate deficiency  . Gout  . Hyperlipemia  . Hypertension  . Lung cancer ([place]) dx'd 2011/2017  squamous cell carcinoma of right lower lobe lung . MI (myocardial infarction) ([place])  . Pulmonary hypertension ([place])  Past Surgical History: Past Surgical History: Procedure Laterality Date . APPENDECTOMY   . BIOPSY  04/21/2019  Procedure: BIOPSY;  Surgeon: Gatha Mayer, MD;  Location: [facility];  Service: Endoscopy;; . ESOPHAGOGASTRODUODENOSCOPY (EGD) WITH PROPOFOL N/A 04/21/2019  Procedure: ESOPHAGOGASTRODUODENOSCOPY (EGD) WITH PROPOFOL;  Surgeon: Gatha Mayer,  MD;  Location: [facility];  Service: Endoscopy;  Laterality: N/A; . ESOPHAGOGASTRODUODENOSCOPY (EGD) WITH PROPOFOL N/A 04/29/2019  Procedure: ESOPHAGOGASTRODUODENOSCOPY (EGD) WITH PROPOFOL;  Surgeon: Mauri Pole, MD;  Location: [facility];  Service: Endoscopy;  Laterality: N/A; . HEMOSTASIS CLIP PLACEMENT  04/29/2019  Procedure: HEMOSTASIS CLIP PLACEMENT;  Surgeon: Mauri Pole, MD;  Location: [facility];  Service: Endoscopy;;  Clip placed as marker not for bleeding control . IR ANGIOGRAM SELECTIVE EACH ADDITIONAL VESSEL  04/29/2019 . IR ANGIOGRAM SELECTIVE EACH ADDITIONAL VESSEL  04/29/2019 . IR ANGIOGRAM SELECTIVE EACH ADDITIONAL VESSEL  04/29/2019 . IR ANGIOGRAM VISCERAL SELECTIVE  04/29/2019 . IR EMBO ART  VEN HEMORR LYMPH EXTRAV  INC GUIDE ROADMAPPING  04/29/2019 . IR US GUIDE VASC ACCESS RIGHT  04/29/2019 . LUNG BIOPSY   HPI: Pt is a 74 y.o. male with medical history significant of HTN, HLD, COPD, CHF, and squamous cell carcinoma of the right lung who presented to the ED with syncope. CXR 8/8: Increased airspace filling opacities in the right lower lobe, suggesting infectious infiltrate in addition to known right lower lobe mass. CT head negative for acute changes. CT chest 8/8: Increasing masslike airspace opacity in the right lower lobe. Previously, the central mass could be visualized, surrounded by airspace disease. Increasing size of small right pleural effusion. SLP consulted due to pt demonstrating coughing when eating.  Subjective: Pt's study was limited due to pt's refusal to accept solid trials and subsequent agitation following encouragement from staff and his family. Pt became increasingly agitated as the session progressed. He ultimately unstrapped himself from the swallow chair and was physically resistant to staff's  efforts to assist him back to the chair. Pt walked around the radiology suite with support from SLP, his niece, and multiple radiology techs while voicing his intent to  leave the hospital and "get a cigarette". Pt's agitation reduced with some intervention from his niece and he was ultimately amenable to sitting a wheelchair. Security was contacted and pt was escorted back to his room with transport, three security gurards, and the pt's niece. Assessment / Plan / Recommendation CHL IP CLINICAL IMPRESSIONS 08/09/2020 Clinical Impression Subjective: Pt's study was limited due to pt's refusal to accept solid trials and subsequent agitation following encouragement from staff and his family. Pt became increasingly agitated as the session progressed. He ultimately unstrapped himself from the swallow chair and was physically resistant to staff's efforts to assist him back to the chair. Pt walked around the radiology suite with support from SLP, his niece, and multiple radiology techs while voicing his intent to leave the hospital and "get a cigarette". Pt's agitation reduced with some intervention from his niece and he was ultimately amenable to sitting a wheelchair. Security was contacted and pt was escorted back to his room with transport, three security gurards, and the pt's niece. Pt presented with pharyngeal dysphagia characterized by a pharyngeal delay, and reduced length of laryngeal vestibule closure which resulted in deep penetration (PAS 5) and ultimate aspiration (PAS 7, 8) with inconsistent sensation. A dysphagia 3 diet with nectar thick liquids is recommended at this time. SLP will continue to follow pt.  SLP Visit Diagnosis Dysphagia, oropharyngeal phase (R13.12) Attention and concentration deficit following -- Frontal lobe and executive function deficit following -- Impact on safety and function Mild aspiration risk   CHL IP TREATMENT RECOMMENDATION 08/09/2020 Treatment Recommendations Therapy as outlined in treatment plan below   Prognosis 08/09/2020 Prognosis for Safe Diet Advancement Good Barriers to Reach Goals Cognitive deficits;Severity of deficits Barriers/Prognosis  Comment -- CHL IP DIET RECOMMENDATION 08/09/2020 SLP Diet Recommendations Dysphagia 3 (Mech soft) solids;Nectar thick liquid Liquid Administration via Cup;Straw Medication Administration Crushed with puree Compensations Slow rate;Small sips/bites Postural Changes Remain semi-upright after after feeds/meals (Comment);Seated upright at 90 degrees   CHL IP OTHER RECOMMENDATIONS 08/09/2020 Recommended Consults -- Oral Care Recommendations Oral care BID Other Recommendations --   CHL IP FOLLOW UP RECOMMENDATIONS 08/09/2020 Follow up Recommendations Other (comment)   CHL IP FREQUENCY AND DURATION 08/09/2020 Speech Therapy Frequency (ACUTE ONLY) min 2x/week Treatment Duration 2 weeks      CHL IP ORAL PHASE 08/09/2020 Oral Phase WFL Oral - Pudding Teaspoon -- Oral - Pudding Cup -- Oral - Honey Teaspoon -- Oral - Honey Cup -- Oral - Nectar Teaspoon -- Oral - Nectar Cup -- Oral - Nectar Straw -- Oral - Thin Teaspoon -- Oral - Thin Cup -- Oral - Thin Straw -- Oral - Puree -- Oral - Mech Soft -- Oral - Regular -- Oral - Multi-Consistency -- Oral - Pill -- Oral Phase - Comment --  CHL IP PHARYNGEAL PHASE 08/09/2020 Pharyngeal Phase Impaired Pharyngeal- Pudding Teaspoon -- Pharyngeal -- Pharyngeal- Pudding Cup -- Pharyngeal -- Pharyngeal- Honey Teaspoon -- Pharyngeal -- Pharyngeal- Honey Cup -- Pharyngeal -- Pharyngeal- Nectar Teaspoon -- Pharyngeal -- Pharyngeal- Nectar Cup -- Pharyngeal -- Pharyngeal- Nectar Straw -- Pharyngeal -- Pharyngeal- Thin Teaspoon -- Pharyngeal -- Pharyngeal- Thin Cup Reduced airway/laryngeal closure;Penetration/Aspiration during swallow;Penetration/Apiration after swallow Pharyngeal Material enters airway, CONTACTS cords and not ejected out;Material enters airway, passes BELOW cords without attempt by patient to eject out (silent aspiration);Material enters airway, passes  BELOW cords and not ejected out despite cough attempt by patient Pharyngeal- Thin Straw Reduced airway/laryngeal  closure;Penetration/Apiration after swallow;Penetration/Aspiration during swallow Pharyngeal Material enters airway, CONTACTS cords and not ejected out;Material enters airway, passes BELOW cords without attempt by patient to eject out (silent aspiration);Material enters airway, passes BELOW cords and not ejected out despite cough attempt by patient Pharyngeal- Puree -- Pharyngeal -- Pharyngeal- Mechanical Soft Delayed swallow initiation-pyriform sinuses Pharyngeal -- Pharyngeal- Regular -- Pharyngeal -- Pharyngeal- Multi-consistency -- Pharyngeal -- Pharyngeal- Pill -- Pharyngeal -- Pharyngeal Comment --  CHL IP CERVICAL ESOPHAGEAL PHASE 08/09/2020 Cervical Esophageal Phase (No Data) Pudding Teaspoon -- Pudding Cup -- Honey Teaspoon -- Honey Cup -- Nectar Teaspoon -- Nectar Cup -- Nectar Straw -- Thin Teaspoon -- Thin Cup -- Thin Straw -- Puree -- Mechanical Soft -- Regular -- Multi-consistency -- Pill -- Cervical Esophageal Comment -- Shanika I. Hardin Negus, [place], [place] Office number [phone] Pager [phone] Horton Marshall 08/09/2020, 2:08 PM              ECHOCARDIOGRAM COMPLETE  Result Date: 08/07/2020    ECHOCARDIOGRAM REPORT   Patient Name:   John [name] Date of Exam: 08/07/2020 Medical Rec #:  [id number]       Height:       65.0 in Accession #:    [id number]      Weight:       146.7 lb Date of Birth:  1946-07-16       BSA:          1.734 m Patient Age:    7 years        BP:           157/66 mmHg Patient Gender: M               HR:           82 bpm. Exam Location:  Inpatient Procedure: 2D Echo Indications:    acute diastolic CHF 235.36  History:        Patient has prior history of Echocardiogram examinations, most                 recent 09/20/2019. COPD and chronic kidney disease. Cancer; Risk                 Factors:Hypertension.  Sonographer:    [name] Referring Phys: [place]  1. Left ventricular ejection fraction, by estimation, is  60 to 65%. The left ventricle has normal function. The left ventricle has no regional wall motion abnormalities. Left ventricular diastolic parameters are consistent with Grade I diastolic dysfunction (impaired relaxation).  2. Right ventricular systolic function is normal. The right ventricular size is normal. Tricuspid regurgitation signal is inadequate for assessing PA pressure.  3. The mitral valve is grossly normal. No evidence of mitral valve regurgitation. No evidence of mitral stenosis.  4. The aortic valve is tricuspid. Aortic valve regurgitation is not visualized. Mild aortic valve sclerosis is present, with no evidence of aortic valve stenosis.  5. The inferior vena cava is normal in size with greater than 50% respiratory variability, suggesting right atrial pressure of 3 mmHg. Comparison(s): No significant change from prior study. FINDINGS  Left Ventricle: Left ventricular ejection fraction, by estimation, is 60 to 65%. The left ventricle has normal function. The left ventricle has no regional wall motion abnormalities. The left ventricular internal cavity size was normal in size. There is  no left ventricular hypertrophy. Left ventricular  diastolic parameters are consistent with Grade I diastolic dysfunction (impaired relaxation). Normal left ventricular filling pressure. Right Ventricle: The right ventricular size is normal. No increase in right ventricular wall thickness. Right ventricular systolic function is normal. Tricuspid regurgitation signal is inadequate for assessing PA pressure. Left Atrium: Left atrial size was normal in size. Right Atrium: Right atrial size was normal in size. Pericardium: Trivial pericardial effusion is present. Mitral Valve: The mitral valve is grossly normal. No evidence of mitral valve regurgitation. No evidence of mitral valve stenosis. Tricuspid Valve: The tricuspid valve is grossly normal. Tricuspid valve regurgitation is not demonstrated. No evidence of tricuspid  stenosis. Aortic Valve: The aortic valve is tricuspid. . There is mild thickening and mild calcification of the aortic valve. Aortic valve regurgitation is not visualized. Mild aortic valve sclerosis is present, with no evidence of aortic valve stenosis. There is mild thickening of the aortic valve. There is mild calcification of the aortic valve. Pulmonic Valve: The pulmonic valve was grossly normal. Pulmonic valve regurgitation is not visualized. No evidence of pulmonic stenosis. Aorta: The aortic root is normal in size and structure. Venous: The inferior vena cava is normal in size with greater than 50% respiratory variability, suggesting right atrial pressure of 3 mmHg. IAS/Shunts: The atrial septum is grossly normal.  LEFT VENTRICLE PLAX 2D LVIDd:         4.70 cm  Diastology LVIDs:         3.10 cm  LV e' lateral:   8.49 cm/s LV PW:         1.10 cm  LV E/e' lateral: 10.8 LV IVS:        0.80 cm  LV e' medial:    8.05 cm/s LVOT diam:     2.10 cm  LV E/e' medial:  11.3 LV SV:         82 LV SV Index:   47 LVOT Area:     3.46 cm  RIGHT VENTRICLE             IVC RV S prime:     12.30 cm/s  IVC diam: 1.90 cm TAPSE (M-mode): 1.9 cm LEFT ATRIUM             Index       RIGHT ATRIUM           Index LA diam:        4.40 cm 2.54 cm/m  RA Area:     16.20 cm LA Vol (A2C):   62.2 ml 35.87 ml/m RA Volume:   43.10 ml  24.86 ml/m LA Vol (A4C):   56.4 ml 32.53 ml/m LA Biplane Vol: 61.4 ml 35.41 ml/m  AORTIC VALVE LVOT Vmax:   112.00 cm/s LVOT Vmean:  64.700 cm/s LVOT VTI:    0.236 m  AORTA Ao Root diam: 2.90 cm MITRAL VALVE MV Area (PHT): 3.37 cm     SHUNTS MV Decel Time: 225 msec     Systemic VTI:  0.24 m MV E velocity: 91.30 cm/s   Systemic Diam: 2.10 cm MV A velocity: 112.00 cm/s MV E/A ratio:  0.82 Eleonore Chiquito MD Electronically signed by Eleonore Chiquito MD Signature Date/Time: 08/07/2020/2:34:08 PM    Final    VAS [place] LOWER EXTREMITY VENOUS (DVT) (MC and WL 7a-7p)  Result Date: 08/06/2020  Lower Venous DVTStudy  Indications: Swelling.  Comparison Study: No prior studies. Performing Technologist: Darlin Coco  Examination Guidelines: A complete evaluation includes B-mode imaging, spectral Doppler, color Doppler, and power  Doppler as needed of all accessible portions of each vessel. Bilateral testing is considered an integral part of a complete examination. Limited examinations for reoccurring indications may be performed as noted. The reflux portion of the exam is performed with the patient in reverse Trendelenburg.  +-----+---------------+---------+-----------+----------+--------------+ RIGHTCompressibilityPhasicitySpontaneityPropertiesThrombus Aging +-----+---------------+---------+-----------+----------+--------------+ CFV  Full           Yes      Yes                                 +-----+---------------+---------+-----------+----------+--------------+   +---------+---------------+---------+-----------+----------+--------------+ LEFT     CompressibilityPhasicitySpontaneityPropertiesThrombus Aging +---------+---------------+---------+-----------+----------+--------------+ CFV      Full           Yes      Yes                                 +---------+---------------+---------+-----------+----------+--------------+ SFJ      Full                                                        +---------+---------------+---------+-----------+----------+--------------+ FV Prox  Full                                                        +---------+---------------+---------+-----------+----------+--------------+ FV Mid   Full                                                        +---------+---------------+---------+-----------+----------+--------------+ FV DistalFull                                                        +---------+---------------+---------+-----------+----------+--------------+ PFV      Full                                                         +---------+---------------+---------+-----------+----------+--------------+ POP      Full           Yes      Yes                                 +---------+---------------+---------+-----------+----------+--------------+ PTV      Full                                                        +---------+---------------+---------+-----------+----------+--------------+ PERO  Full                                                        +---------+---------------+---------+-----------+----------+--------------+     Summary: RIGHT: - No evidence of common femoral vein obstruction.  LEFT: - There is no evidence of deep vein thrombosis in the lower extremity.  - No cystic structure found in the popliteal fossa.  *See table(s) above for measurements and observations. Electronically signed by Harold Barban MD on 08/06/2020 at 4:56:58 PM.    Final     Subjective: Seen and examined at bedside and he is calm and resting in bed with no complaints. No nausea or vomiting. Still pleasantly demented. No chest pain or shortness of breath. No other concerns or close this time a stable to be discharged to skilled nursing facility.  Discharge Exam: Vitals:   08/12/20 0507 08/12/20 0735  BP: (!) 173/105   Pulse: 92   Resp: (!) 22   Temp: (!) 97.3 F (36.3 C)   SpO2: 99% 99%   Vitals:   08/11/20 1125 08/11/20 1954 08/12/20 0507 08/12/20 0735  BP: 131/60 (!) 174/61 (!) 173/105   Pulse: 74 81 92   Resp: 20 17 (!) 22   Temp: 97.8 F (36.6 C) 98.4 F (36.9 C) (!) 97.3 F (36.3 C)   TempSrc: Oral Oral Oral   SpO2: 100% 100% 99% 99%  Weight:      Height:       General: Pt is awake and pleasantly demented, not in acute distress Cardiovascular: RRR, S1/S2 +, no rubs, no gallops Respiratory: Diminished bilaterally with coarse breath sounds and some rhonchi on the right compared to left. Unlabored breathing and is wearing supplemental oxygen via nasal cannula Abdominal: Soft, NT, ND, bowel sounds  + Extremities: no edema, no cyanosis  The results of significant diagnostics from this hospitalization (including imaging, microbiology, ancillary and laboratory) are listed below for reference.    Microbiology: Recent Results (from the past 240 hour(s))  SARS Coronavirus 2 by RT PCR (hospital order, performed in [facility] hospital lab) Nasopharyngeal Nasopharyngeal Swab     Status: None   Collection Time: 08/06/20  1:28 PM   Specimen: Nasopharyngeal Swab  Result Value Ref Range Status   SARS Coronavirus 2 NEGATIVE NEGATIVE Final    Comment: (NOTE) SARS-CoV-2 target nucleic acids are NOT DETECTED.  The SARS-CoV-2 RNA is generally detectable in upper and lower respiratory specimens during the acute phase of infection. The lowest concentration of SARS-CoV-2 viral copies this assay can detect is 250 copies / mL. A negative result does not preclude SARS-CoV-2 infection and should not be used as the sole basis for treatment or other patient management decisions.  A negative result may occur with improper specimen collection / handling, submission of specimen other than nasopharyngeal swab, presence of viral mutation(s) within the areas targeted by this assay, and inadequate number of viral copies (<250 copies / mL). A negative result must be combined with clinical observations, patient history, and epidemiological information.  Fact Sheet for Patients:   StrictlyIdeas.no  Fact Sheet for Healthcare Providers: BankingDealers.co.za  This test is not yet approved or  cleared by the [place] FDA and has been authorized for detection and/or diagnosis of SARS-CoV-2 by FDA under an Emergency Use Authorization (EUA).  This EUA will remain  in effect (meaning this test can be used) for the duration of the COVID-19 declaration under Section 564(b)(1) of the Act, 21 U.S.C. section 360bbb-3(b)(1), unless the authorization is terminated  or revoked sooner.  Performed at [facility], [place] [address], [place], [place] [address]   Urine culture     Status: None   Collection Time: 08/06/20  2:45 PM   Specimen: Urine, Clean Catch  Result Value Ref Range Status   Specimen Description URINE, CLEAN CATCH  Final   Special Requests NONE  Final   Culture   Final    NO GROWTH Performed at [facility], [place] [address], [place], [place] [address]    Report Status 08/07/2020 FINAL  Final  Culture, blood (routine x 2)     Status: None   Collection Time: 08/06/20  3:28 PM   Specimen: BLOOD RIGHT FOREARM  Result Value Ref Range Status   Specimen Description BLOOD RIGHT FOREARM  Final   Special Requests   Final    BOTTLES DRAWN AEROBIC AND ANAEROBIC Blood Culture results may not be optimal due to an inadequate volume of blood received in culture bottles   Culture   Final    NO GROWTH 5 DAYS Performed at [facility], [place] [address] [place], [place] [address]    Report Status 08/11/2020 FINAL  Final  Culture, blood (routine x 2)     Status: None   Collection Time: 08/06/20  3:37 PM   Specimen: BLOOD LEFT FOREARM  Result Value Ref Range Status   Specimen Description BLOOD LEFT FOREARM  Final   Special Requests   Final    BOTTLES DRAWN AEROBIC AND ANAEROBIC Blood Culture results may not be optimal due to an inadequate volume of blood received in culture bottles   Culture   Final    NO GROWTH 5 DAYS Performed at [facility], [place] [address] [place], [place] [address]    Report Status 08/11/2020 FINAL  Final  SARS Coronavirus 2 by RT PCR (hospital order, performed in [facility] hospital lab) Nasopharyngeal Nasopharyngeal Swab     Status: None   Collection Time: 08/10/20  2:34 PM   Specimen: Nasopharyngeal Swab  Result Value Ref Range Status   SARS Coronavirus 2 NEGATIVE NEGATIVE Final    Comment: (NOTE) SARS-CoV-2 target nucleic acids are NOT DETECTED.  The SARS-CoV-2 RNA is generally detectable in  upper and lower respiratory specimens during the acute phase of infection. The lowest concentration of SARS-CoV-2 viral copies this assay can detect is 250 copies / mL. A negative result does not preclude SARS-CoV-2 infection and should not be used as the sole basis for treatment or other patient management decisions.  A negative result may occur with improper specimen collection / handling, submission of specimen other than nasopharyngeal swab, presence of viral mutation(s) within the areas targeted by this assay, and inadequate number of viral copies (<250 copies / mL). A negative result must be combined with clinical observations, patient history, and epidemiological information.  Fact Sheet for Patients:   StrictlyIdeas.no  Fact Sheet for Healthcare Providers: BankingDealers.co.za  This test is not yet approved or  cleared by the [place] FDA and has been authorized for detection and/or diagnosis of SARS-CoV-2 by FDA under an Emergency Use Authorization (EUA).  This EUA will remain in effect (meaning this test can be used) for the duration of the COVID-19 declaration under Section 564(b)(1) of the Act, 21 U.S.C. section 360bbb-3(b)(1),  unless the authorization is terminated or revoked sooner.  Performed at [facility], [place] [address], [place], [place] [address]      Labs: BNP (last 3 results) Recent Labs    09/01/19 2250 09/20/19 0333 08/06/20 1311  BNP 42.3 38.9 654.6*   Basic Metabolic Panel: Recent Labs  Lab 08/06/20 1528 08/06/20 1716 08/07/20 0732 08/08/20 1122 08/09/20 0750 08/10/20 0427 08/10/20 1052  NA  --    < > 141 142 141 141 141  K  --    < > 4.7 4.6 4.5 4.9 4.7  CL  --    < > 99 100 99 101 102  CO2  --   --  33* 33* 33* 33* 31  GLUCOSE  --    < > 79 102* 92 104* 125*  BUN  --    < > _0 CREATININE  --    < > 1.27* 1.15 1.33* 1.42* 1.28*  CALCIUM  --   --  8.7* 8.8* 9.0 8.9  8.9  MG 1.6*  --  1.9  --  1.7 1.9  --   PHOS  --   --   --  3.4 2.8 3.7  --    < > = values in this interval not displayed.   Liver Function Tests: Recent Labs  Lab 08/06/20 1311 08/08/20 1122 08/09/20 0750 08/10/20 0427  AST 50*  --  11* 13*  ALT 18  --  13 11  ALKPHOS 80  --  63 58  BILITOT 0.7  --  0.8 0.6  PROT 5.7*  --  6.2* 6.0*  ALBUMIN 2.5* 2.6* 2.8* 2.7*   No results for input(s): LIPASE, AMYLASE in the last 168 hours. No results for input(s): AMMONIA in the last 168 hours. CBC: Recent Labs  Lab 08/06/20 1311 08/06/20 1716 08/08/20 1122 08/09/20 0750 08/10/20 0427  WBC 8.4  --  7.6 8.0 6.3  NEUTROABS 7.2  --  6.2 6.6 4.8  HGB 8.4* 10.9* 8.5* 8.3* 8.1*  HCT 28.0* 32.0* 28.2* 27.4* 26.1*  MCV 99.3  --  100.0 100.0 98.9  PLT 284  --  277 259 232   Cardiac Enzymes: No results for input(s): CKTOTAL, CKMB, CKMBINDEX, TROPONINI in the last 168 hours. BNP: Invalid input(s): POCBNP CBG: Recent Labs  Lab 08/06/20 1709  GLUCAP 97   D-Dimer No results for input(s): DDIMER in the last 72 hours. Hgb A1c No results for input(s): HGBA1C in the last 72 hours. Lipid Profile No results for input(s): CHOL, HDL, LDLCALC, TRIG, CHOLHDL, LDLDIRECT in the last 72 hours. Thyroid function studies No results for input(s): TSH, T4TOTAL, T3FREE, THYROIDAB in the last 72 hours.  Invalid input(s): FREET3 Anemia work up Recent Labs    08/10/20 1052  VITAMINB12 748  FOLATE 30.3  FERRITIN 40  TIBC 291  IRON 86  RETICCTPCT 2.0   Urinalysis    Component Value Date/Time   COLORURINE YELLOW 08/06/2020 1448   APPEARANCEUR CLEAR 08/06/2020 1448   LABSPEC 1.012 08/06/2020 1448   PHURINE 7.0 08/06/2020 1448   GLUCOSEU NEGATIVE 08/06/2020 1448   HGBUR NEGATIVE 08/06/2020 1448   BILIRUBINUR NEGATIVE 08/06/2020 1448   KETONESUR NEGATIVE 08/06/2020 1448   PROTEINUR NEGATIVE 08/06/2020 1448   UROBILINOGEN 1.0 02/22/2012 0238   NITRITE NEGATIVE 08/06/2020 1448    LEUKOCYTESUR NEGATIVE 08/06/2020 1448   Sepsis Labs Invalid input(s): PROCALCITONIN,  WBC,  LACTICIDVEN Microbiology Recent Results (from the past 240 hour(s))  SARS  Coronavirus 2 by RT PCR (hospital order, performed in [facility] hospital lab) Nasopharyngeal Nasopharyngeal Swab     Status: None   Collection Time: 08/06/20  1:28 PM   Specimen: Nasopharyngeal Swab  Result Value Ref Range Status   SARS Coronavirus 2 NEGATIVE NEGATIVE Final    Comment: (NOTE) SARS-CoV-2 target nucleic acids are NOT DETECTED.  The SARS-CoV-2 RNA is generally detectable in upper and lower respiratory specimens during the acute phase of infection. The lowest concentration of SARS-CoV-2 viral copies this assay can detect is 250 copies / mL. A negative result does not preclude SARS-CoV-2 infection and should not be used as the sole basis for treatment or other patient management decisions.  A negative result may occur with improper specimen collection / handling, submission of specimen other than nasopharyngeal swab, presence of viral mutation(s) within the areas targeted by this assay, and inadequate number of viral copies (<250 copies / mL). A negative result must be combined with clinical observations, patient history, and epidemiological information.  Fact Sheet for Patients:   StrictlyIdeas.no  Fact Sheet for Healthcare Providers: BankingDealers.co.za  This test is not yet approved or  cleared by the [place] FDA and has been authorized for detection and/or diagnosis of SARS-CoV-2 by FDA under an Emergency Use Authorization (EUA).  This EUA will remain in effect (meaning this test can be used) for the duration of the COVID-19 declaration under Section 564(b)(1) of the Act, 21 U.S.C. section 360bbb-3(b)(1), unless the authorization is terminated or revoked sooner.  Performed at [facility], [place] [address] [place], [place] [address]    Urine culture     Status: None   Collection Time: 08/06/20  2:45 PM   Specimen: Urine, Clean Catch  Result Value Ref Range Status   Specimen Description URINE, CLEAN CATCH  Final   Special Requests NONE  Final   Culture   Final    NO GROWTH Performed at [facility], [place] [address], [place], [place] [address]    Report Status 08/07/2020 FINAL  Final  Culture, blood (routine x 2)     Status: None   Collection Time: 08/06/20  3:28 PM   Specimen: BLOOD RIGHT FOREARM  Result Value Ref Range Status   Specimen Description BLOOD RIGHT FOREARM  Final   Special Requests   Final    BOTTLES DRAWN AEROBIC AND ANAEROBIC Blood Culture results may not be optimal due to an inadequate volume of blood received in culture bottles   Culture   Final    NO GROWTH 5 DAYS Performed at [facility], [place] [address] [place], [place] [address]    Report Status 08/11/2020 FINAL  Final  Culture, blood (routine x 2)     Status: None   Collection Time: 08/06/20  3:37 PM   Specimen: BLOOD LEFT FOREARM  Result Value Ref Range Status   Specimen Description BLOOD LEFT FOREARM  Final   Special Requests   Final    BOTTLES DRAWN AEROBIC AND ANAEROBIC Blood Culture results may not be optimal due to an inadequate volume of blood received in culture bottles   Culture   Final    NO GROWTH 5 DAYS Performed at [facility], [place] [address], [place],  [address]    Report Status 08/11/2020 FINAL  Final  SARS Coronavirus 2 by RT PCR (hospital order, performed in [facility] hospital lab) Nasopharyngeal Nasopharyngeal Swab     Status: None   Collection Time:  08/10/20  2:34 PM   Specimen: Nasopharyngeal Swab  Result Value Ref Range Status   SARS Coronavirus 2 NEGATIVE NEGATIVE Final    Comment: (NOTE) SARS-CoV-2 target nucleic acids are NOT DETECTED.  The SARS-CoV-2 RNA is generally detectable in upper and lower respiratory specimens during the acute phase of infection. The lowest concentration  of SARS-CoV-2 viral copies this assay can detect is 250 copies / mL. A negative result does not preclude SARS-CoV-2 infection and should not be used as the sole basis for treatment or other patient management decisions.  A negative result may occur with improper specimen collection / handling, submission of specimen other than nasopharyngeal swab, presence of viral mutation(s) within the areas targeted by this assay, and inadequate number of viral copies (<250 copies / mL). A negative result must be combined with clinical observations, patient history, and epidemiological information.  Fact Sheet for Patients:   StrictlyIdeas.no  Fact Sheet for Healthcare Providers: BankingDealers.co.za  This test is not yet approved or  cleared by the [place] FDA and has been authorized for detection and/or diagnosis of SARS-CoV-2 by FDA under an Emergency Use Authorization (EUA).  This EUA will remain in effect (meaning this test can be used) for the duration of the COVID-19 declaration under Section 564(b)(1) of the Act, 21 U.S.C. section 360bbb-3(b)(1), unless the authorization is terminated or revoked sooner.  Performed at [facility], [place] [address] [place], [place] [address]    Time coordinating discharge: 35 minutes  SIGNED:  Kerney Elbe, DO [facility] 08/12/2020, 12:17 PM Pager is on [place]  If 7PM-7AM, please contact night-coverage [email or web address]

## 2020-08-15 ENCOUNTER — Encounter: Payer: Self-pay | Admitting: *Deleted

## 2020-08-15 NOTE — Progress Notes (Signed)
Patient ID: [name], male   DOB: 1946-02-07, 74 y.o.   MRN: [id number] Patient enrolled for [employer] to ship a 14 day ZIO AT long term monitor-Live Telemetry to [employer].  Facility called. Letter with instructions mailed to [facility], c/o Freda Jackson, [address], [place], [place] [address]  Attn: [employer] [facility]/ Venetta Cox.

## 2020-08-23 ENCOUNTER — Telehealth: Payer: Self-pay | Admitting: Cardiology

## 2020-08-23 NOTE — Telephone Encounter (Signed)
New Message:     Nurse want to know if you will be providing pt with a ZIO Patch when he comes for his appt on 09-07-20?

## 2020-08-23 NOTE — Telephone Encounter (Signed)
Left detailed message.  Advised zio had been mailed to facility and should be placed by facility when they receive it.

## 2020-08-26 NOTE — Progress Notes (Deleted)
error 

## 2020-08-30 ENCOUNTER — Inpatient Hospital Stay: Payer: Medicare Other | Attending: Internal Medicine | Admitting: Physician Assistant

## 2020-08-30 ENCOUNTER — Telehealth: Payer: Self-pay | Admitting: Physician Assistant

## 2020-08-30 NOTE — Telephone Encounter (Signed)
Called pt per 9/1 sch msg - no answer. Left message for patient to call back to reschedule.

## 2020-09-01 ENCOUNTER — Ambulatory Visit: Payer: Medicare Other | Admitting: Neurology

## 2020-09-07 ENCOUNTER — Ambulatory Visit: Payer: Medicare Other | Admitting: Cardiology
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
## 2020-09-15 ENCOUNTER — Other Ambulatory Visit: Payer: Self-pay

## 2020-09-15 ENCOUNTER — Other Ambulatory Visit (INDEPENDENT_AMBULATORY_CARE_PROVIDER_SITE_OTHER): Payer: Medicare Other

## 2020-09-15 ENCOUNTER — Ambulatory Visit (INDEPENDENT_AMBULATORY_CARE_PROVIDER_SITE_OTHER): Payer: Medicare Other | Admitting: Cardiology

## 2020-09-15 ENCOUNTER — Encounter: Payer: Self-pay | Admitting: Cardiology

## 2020-09-15 VITALS — BP 110/50 | HR 66 | Ht 65.0 in | Wt 143.0 lb

## 2020-09-15 DIAGNOSIS — R55 Syncope and collapse: Secondary | ICD-10-CM

## 2020-09-15 DIAGNOSIS — I10 Essential (primary) hypertension: Secondary | ICD-10-CM

## 2020-09-15 DIAGNOSIS — J449 Chronic obstructive pulmonary disease, unspecified: Secondary | ICD-10-CM

## 2020-09-15 NOTE — Patient Instructions (Addendum)
Medication Instructions:  Your physician recommends that you continue on your current medications as directed. Please refer to the Current Medication list given to you today. *If you need a refill on your cardiac medications before your next appointment, please call your pharmacy*  Lab Work: None ordered. If you have labs (blood work) drawn today and your tests are completely normal, you will receive your results only by: Marland Kitchen MyChart Message (if you have MyChart) OR . A paper copy in the mail If you have any lab test that is abnormal or we need to change your treatment, we will call you to review the results.  Testing/Procedures: Your physician has recommended that you wear a holter monitor. Holter monitors are medical devices that record the heart's electrical activity.   A ZIO heart monitor was placed today.  You were given an instruction pamphlet.    Follow-Up: At [facility], you and your health needs are our priority.  As part of our continuing mission to provide you with exceptional heart care, we have created designated Provider Care Teams.  These Care Teams include your primary Cardiologist (physician) and Advanced Practice Providers (APPs -  Physician Assistants and Nurse Practitioners) who all work together to provide you with the care you need, when you need it.  Your next appointment:   Your physician wants you to follow-up in: based on results of your heart monitor.

## 2020-10-01 ENCOUNTER — Emergency Department (HOSPITAL_COMMUNITY)
Admission: EM | Admit: 2020-10-01 | Discharge: 2020-10-30 | Disposition: E | Payer: Medicare Other | Attending: Emergency Medicine | Admitting: Emergency Medicine

## 2020-10-01 ENCOUNTER — Encounter (HOSPITAL_COMMUNITY): Payer: Self-pay | Admitting: Emergency Medicine

## 2020-10-01 DIAGNOSIS — T2122XA Burn of second degree of abdominal wall, initial encounter: Secondary | ICD-10-CM | POA: Insufficient documentation

## 2020-10-01 DIAGNOSIS — I469 Cardiac arrest, cause unspecified: Secondary | ICD-10-CM | POA: Insufficient documentation

## 2020-10-01 DIAGNOSIS — T2121XA Burn of second degree of chest wall, initial encounter: Secondary | ICD-10-CM | POA: Insufficient documentation

## 2020-10-01 DIAGNOSIS — T24212A Burn of second degree of left thigh, initial encounter: Secondary | ICD-10-CM | POA: Diagnosis not present

## 2020-10-01 DIAGNOSIS — Z87891 Personal history of nicotine dependence: Secondary | ICD-10-CM | POA: Diagnosis not present

## 2020-10-01 DIAGNOSIS — I110 Hypertensive heart disease with heart failure: Secondary | ICD-10-CM | POA: Insufficient documentation

## 2020-10-01 DIAGNOSIS — Z85118 Personal history of other malignant neoplasm of bronchus and lung: Secondary | ICD-10-CM | POA: Diagnosis not present

## 2020-10-01 DIAGNOSIS — W28XXXA Contact with powered lawn mower, initial encounter: Secondary | ICD-10-CM | POA: Diagnosis not present

## 2020-10-01 DIAGNOSIS — I509 Heart failure, unspecified: Secondary | ICD-10-CM | POA: Insufficient documentation

## 2020-10-01 DIAGNOSIS — Y9389 Activity, other specified: Secondary | ICD-10-CM | POA: Insufficient documentation

## 2020-10-01 DIAGNOSIS — E119 Type 2 diabetes mellitus without complications: Secondary | ICD-10-CM | POA: Diagnosis not present

## 2020-10-01 DIAGNOSIS — Y92007 Garden or yard of unspecified non-institutional (private) residence as the place of occurrence of the external cause: Secondary | ICD-10-CM | POA: Diagnosis not present

## 2020-10-01 DIAGNOSIS — J449 Chronic obstructive pulmonary disease, unspecified: Secondary | ICD-10-CM | POA: Insufficient documentation

## 2020-10-01 DIAGNOSIS — K1379 Other lesions of oral mucosa: Secondary | ICD-10-CM

## 2020-10-01 DIAGNOSIS — T300 Burn of unspecified body region, unspecified degree: Secondary | ICD-10-CM
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
## 2020-10-30 NOTE — Progress Notes (Signed)
Per [place], family had originally declined Spiritual Care services, but now requested them. Upon arrival, 3 family members were bedside.  Chaplain offered support, but family declined services, saying they did not need anything at this time.  Please call for f/u as needed.  Minus [place], [place] [phone]     October 16, 2020 1600  Clinical Encounter Type  Visited With Other (Comment) (Family requested, then declined support)  Visit Type Initial  Referral From Family  Consult/Referral To Chaplain

## 2020-10-30 DEATH — deceased

## 2020-12-25 ENCOUNTER — Other Ambulatory Visit: Payer: Medicare Other

## 2020-12-27 ENCOUNTER — Ambulatory Visit: Payer: Medicare Other | Admitting: Internal Medicine

## 2021-01-02 ENCOUNTER — Ambulatory Visit: Payer: Medicare Other | Admitting: Neurology
